# Patient Record
Sex: Male | Born: 1950 | Race: White | Hispanic: No | Smoking: Former smoker
Health system: Southern US, Community
[De-identification: ages and names within clinical notes are randomized; demographics above are authoritative.]

## PROBLEM LIST (undated history)

## (undated) DIAGNOSIS — N185 Chronic kidney disease, stage 5: Secondary | ICD-10-CM

## (undated) DIAGNOSIS — N189 Chronic kidney disease, unspecified: Secondary | ICD-10-CM

## (undated) DIAGNOSIS — R011 Cardiac murmur, unspecified: Secondary | ICD-10-CM

## (undated) DIAGNOSIS — I10 Essential (primary) hypertension: Secondary | ICD-10-CM

## (undated) DIAGNOSIS — E611 Iron deficiency: Secondary | ICD-10-CM

## (undated) DIAGNOSIS — N200 Calculus of kidney: Secondary | ICD-10-CM

## (undated) DIAGNOSIS — Z87442 Personal history of urinary calculi: Secondary | ICD-10-CM

## (undated) DIAGNOSIS — N529 Male erectile dysfunction, unspecified: Secondary | ICD-10-CM

## (undated) DIAGNOSIS — D3002 Benign neoplasm of left kidney: Secondary | ICD-10-CM

## (undated) DIAGNOSIS — G4733 Obstructive sleep apnea (adult) (pediatric): Secondary | ICD-10-CM

## (undated) DIAGNOSIS — E119 Type 2 diabetes mellitus without complications: Secondary | ICD-10-CM

## (undated) DIAGNOSIS — D631 Anemia in chronic kidney disease: Secondary | ICD-10-CM

## (undated) DIAGNOSIS — R55 Syncope and collapse: Secondary | ICD-10-CM

## (undated) DIAGNOSIS — I341 Nonrheumatic mitral (valve) prolapse: Secondary | ICD-10-CM

## (undated) DIAGNOSIS — E7800 Pure hypercholesterolemia, unspecified: Secondary | ICD-10-CM

## (undated) HISTORY — PX: TONSILLECTOMY: SUR1361

## (undated) HISTORY — DX: Chronic kidney disease, unspecified: N18.9

## (undated) HISTORY — DX: Nonrheumatic mitral (valve) prolapse: I34.1

## (undated) HISTORY — DX: Essential (primary) hypertension: I10

## (undated) HISTORY — PX: COLONOSCOPY: SHX174

## (undated) HISTORY — DX: Type 2 diabetes mellitus without complications: E11.9

## (undated) HISTORY — DX: Anemia in chronic kidney disease: D63.1

## (undated) HISTORY — DX: Calculus of kidney: N20.0

## (undated) HISTORY — DX: Male erectile dysfunction, unspecified: N52.9

## (undated) HISTORY — DX: Chronic kidney disease, stage 5: N18.5

## (undated) HISTORY — DX: Pure hypercholesterolemia, unspecified: E78.00

## (undated) HISTORY — DX: Obstructive sleep apnea (adult) (pediatric): G47.33

## (undated) HISTORY — DX: Iron deficiency: E61.1

## (undated) HISTORY — DX: Benign neoplasm of left kidney: D30.02

---

## 2008-03-28 ENCOUNTER — Inpatient Hospital Stay (HOSPITAL_COMMUNITY): Admission: RE | Admit: 2008-03-28 | Discharge: 2008-03-31 | Payer: Self-pay | Admitting: Orthopedic Surgery

## 2008-09-30 DIAGNOSIS — D3002 Benign neoplasm of left kidney: Secondary | ICD-10-CM

## 2008-09-30 DIAGNOSIS — N200 Calculus of kidney: Secondary | ICD-10-CM

## 2008-09-30 HISTORY — DX: Calculus of kidney: N20.0

## 2008-09-30 HISTORY — PX: JOINT REPLACEMENT: SHX530

## 2008-09-30 HISTORY — DX: Benign neoplasm of left kidney: D30.02

## 2008-09-30 HISTORY — PX: PARTIAL NEPHRECTOMY: SHX414

## 2009-03-31 ENCOUNTER — Encounter: Admission: RE | Admit: 2009-03-31 | Discharge: 2009-03-31 | Payer: Self-pay | Admitting: Nephrology

## 2009-04-05 ENCOUNTER — Ambulatory Visit (HOSPITAL_COMMUNITY): Admission: RE | Admit: 2009-04-05 | Discharge: 2009-04-05 | Payer: Self-pay | Admitting: Nephrology

## 2009-05-10 ENCOUNTER — Ambulatory Visit (HOSPITAL_COMMUNITY): Admission: RE | Admit: 2009-05-10 | Discharge: 2009-05-10 | Payer: Self-pay | Admitting: Urology

## 2009-05-25 ENCOUNTER — Encounter (INDEPENDENT_AMBULATORY_CARE_PROVIDER_SITE_OTHER): Payer: Self-pay | Admitting: Urology

## 2009-05-25 ENCOUNTER — Inpatient Hospital Stay (HOSPITAL_COMMUNITY): Admission: RE | Admit: 2009-05-25 | Discharge: 2009-05-28 | Payer: Self-pay | Admitting: Urology

## 2010-04-12 IMAGING — RF DG RETROGRADE PYELOGRAM
1 series · 7 of 7 positions shown · non-contrast
Comparison: CT abdomen 04/05/2009.

CLINICAL DATA: Left renal mass, bladder lesion.

RETROGRADE PYELOGRAM

[Series 1: run · 7 of 7 slices shown]
[im 1/7]
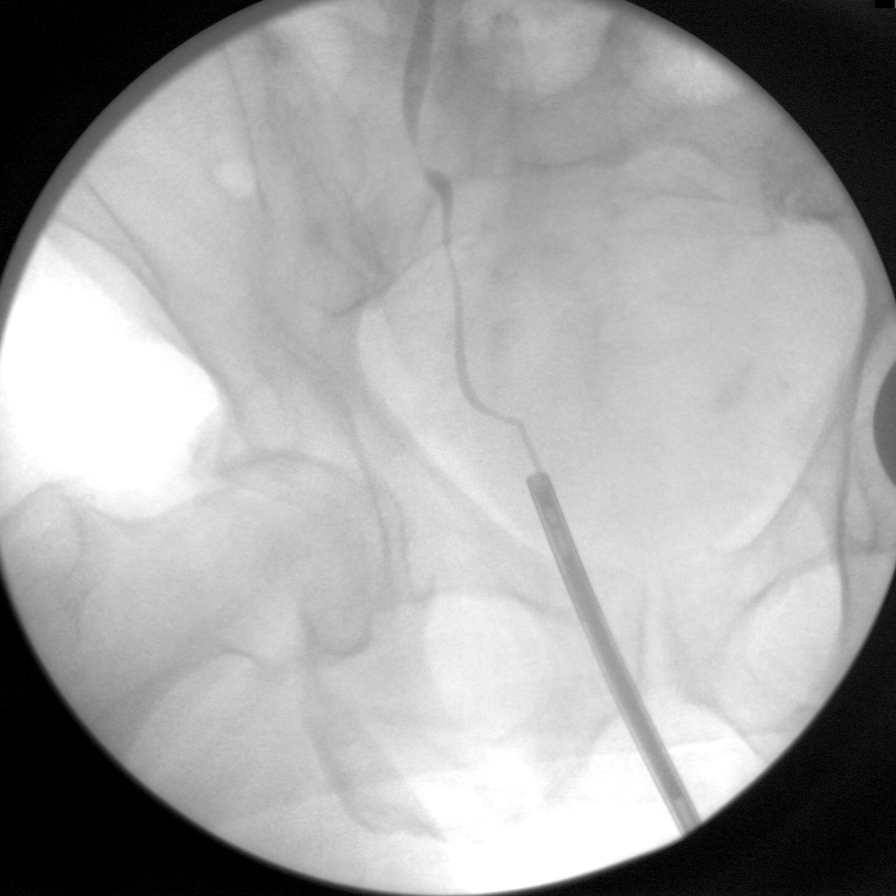
[im 2/7]
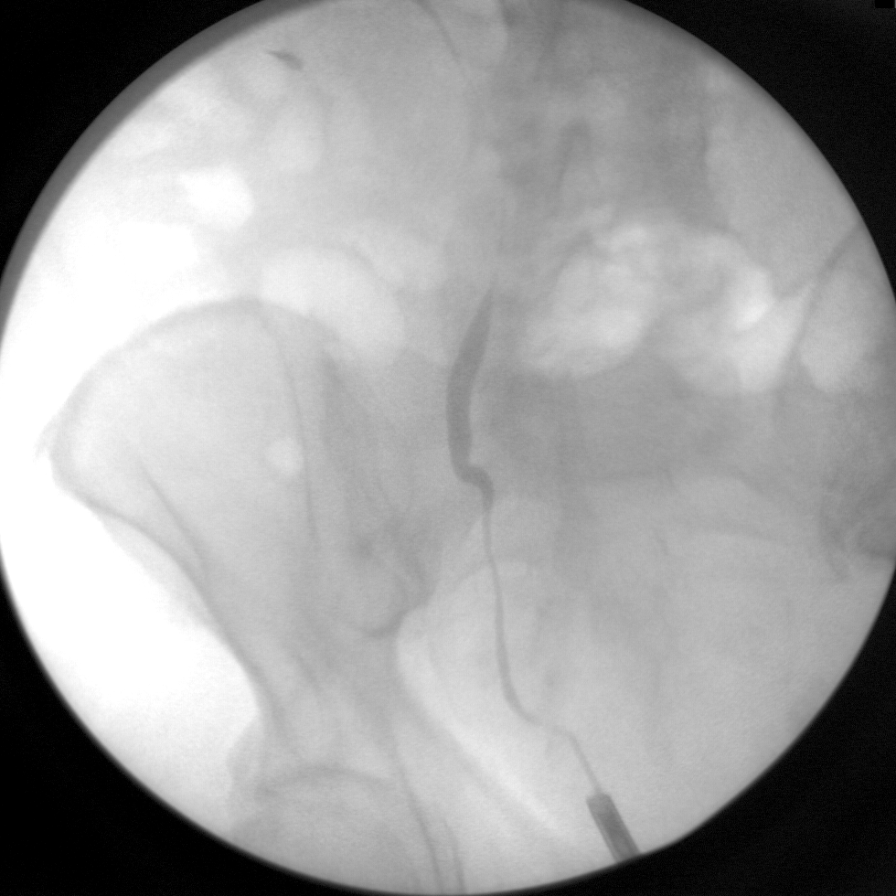
[im 3/7]
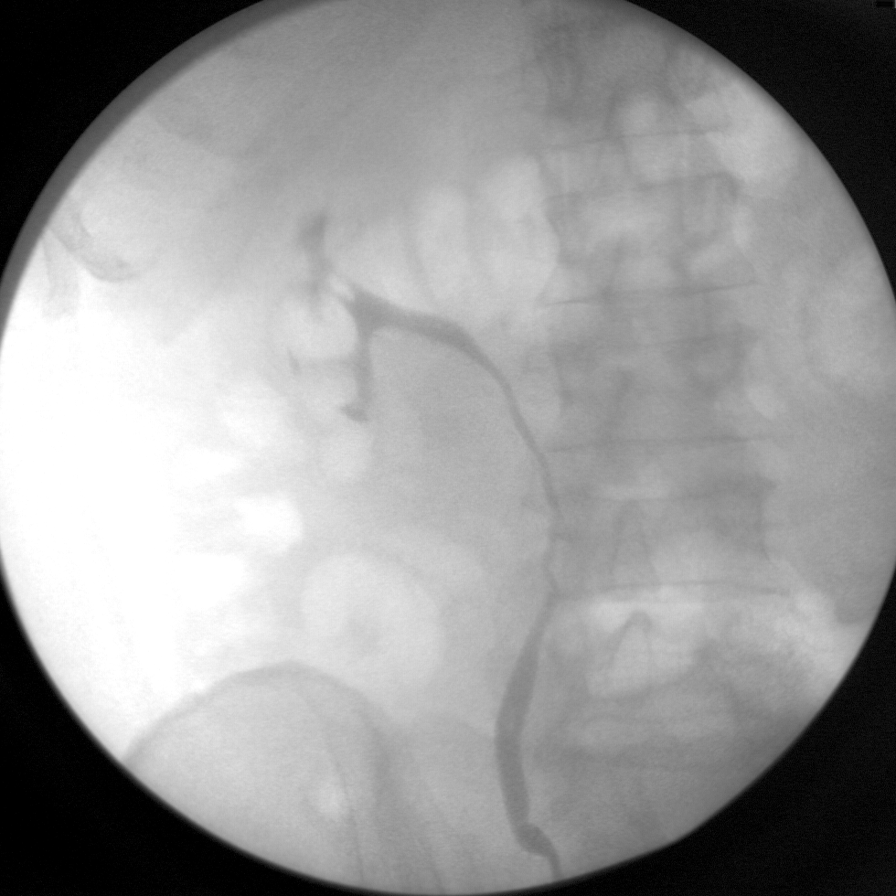
[im 4/7]
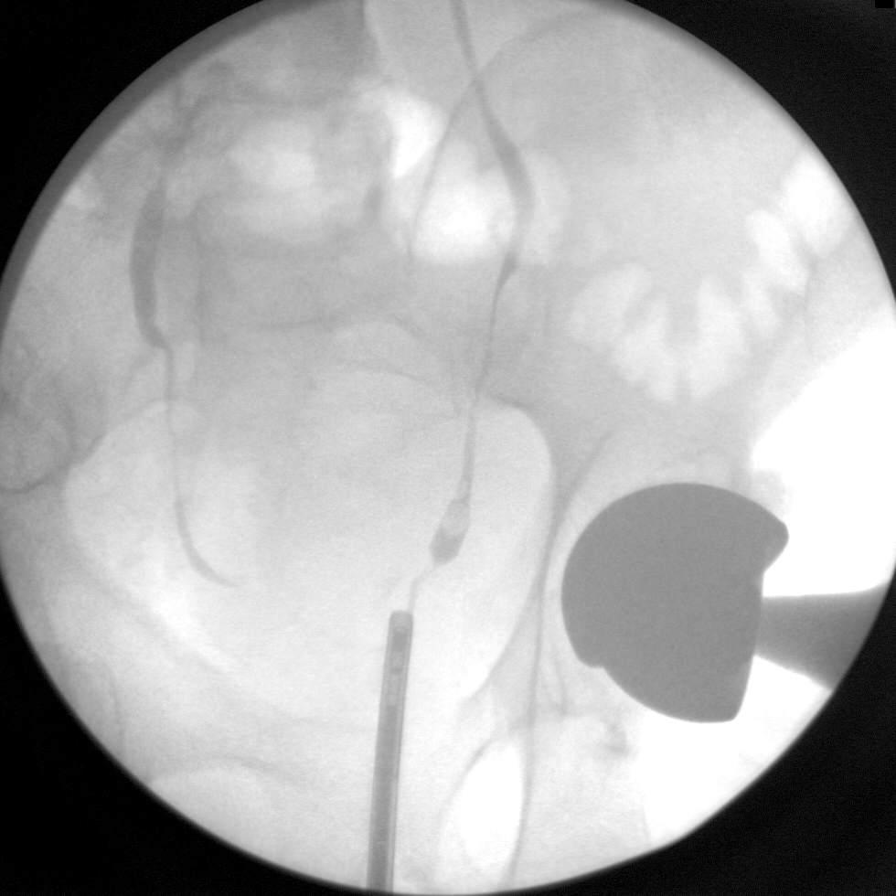
[im 5/7]
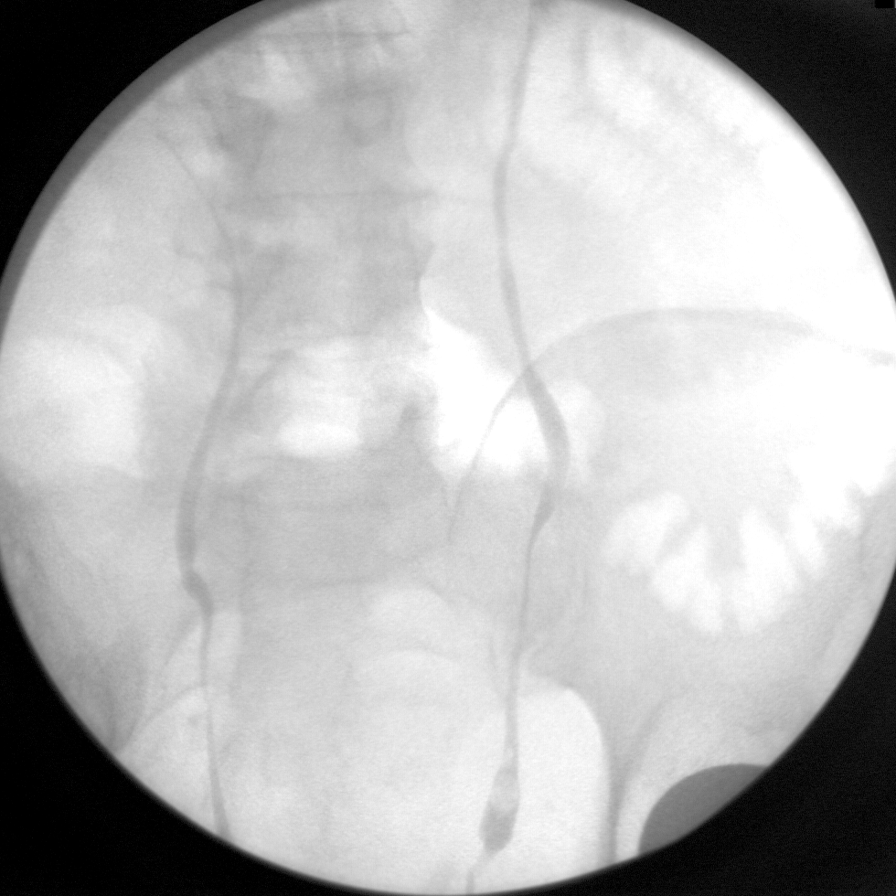
[im 6/7]
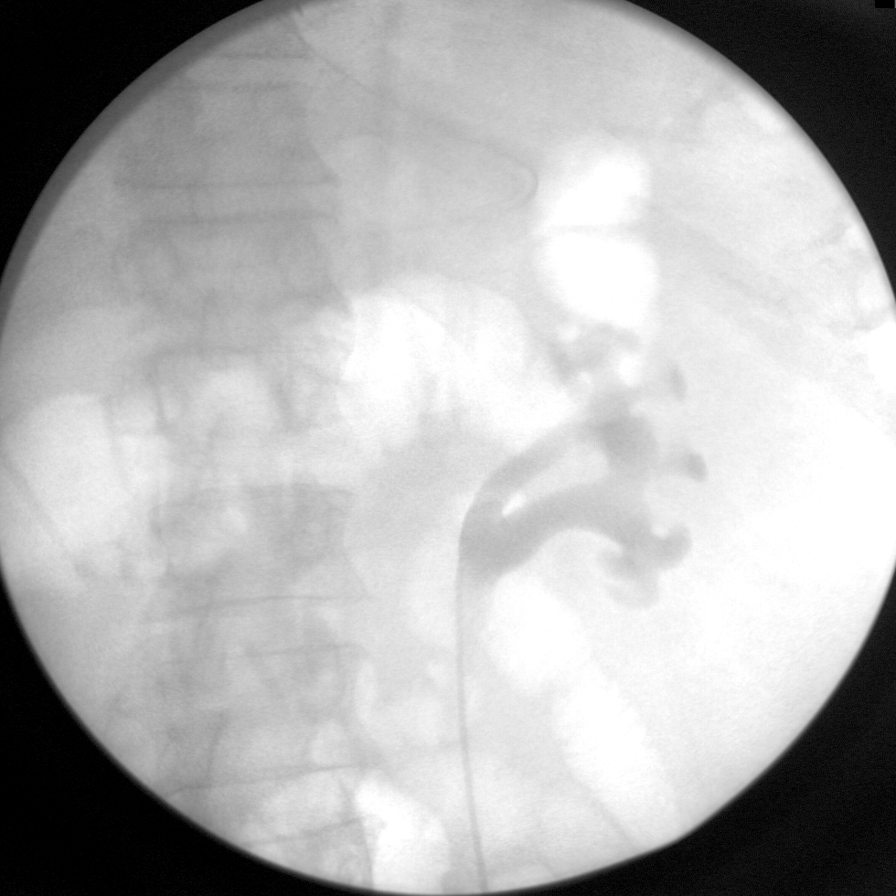
[im 7/7]
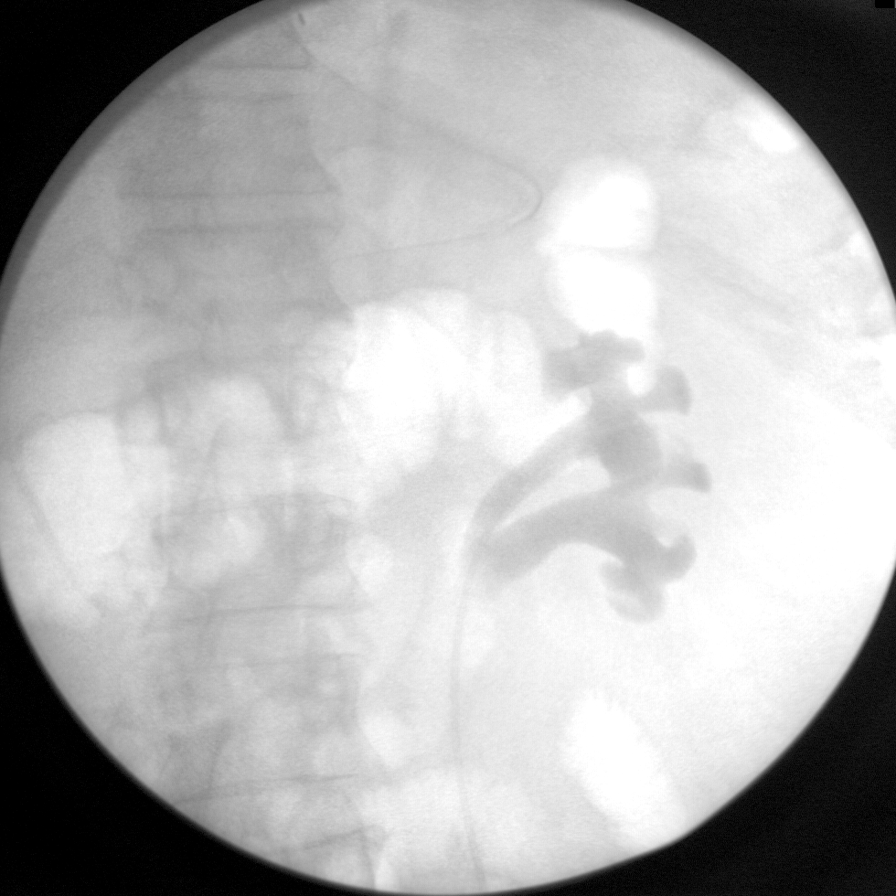

[7 of 7 positions shown; findings below may reference images not displayed]

FINDINGS: Bilateral retrograde pyelograms were performed.
Visualized right ureter is normal caliber.  No filling defects.
Collecting system normal.

Within the left ureter, there is a rounded filling defect which
could represent retained stone.  There is mild fullness of the left
renal collecting system.
IMPRESSION: Filling defect in the distal left ureter concerning for distal
ureteral stone.  Mild fullness of the left renal collecting system.

## 2010-10-22 ENCOUNTER — Encounter: Payer: Self-pay | Admitting: Nephrology

## 2011-01-05 LAB — CBC
HCT: 39.3 % (ref 39.0–52.0)
HCT: 42.9 % (ref 39.0–52.0)
HCT: 43.9 % (ref 39.0–52.0)
Hemoglobin: 13.2 g/dL (ref 13.0–17.0)
Hemoglobin: 15 g/dL (ref 13.0–17.0)
Hemoglobin: 15.6 g/dL (ref 13.0–17.0)
MCHC: 33.7 g/dL (ref 30.0–36.0)
MCHC: 34.9 g/dL (ref 30.0–36.0)
MCHC: 35.6 g/dL (ref 30.0–36.0)
MCV: 88.8 fL (ref 78.0–100.0)
MCV: 89.1 fL (ref 78.0–100.0)
MCV: 91 fL (ref 78.0–100.0)
Platelets: 183 10*3/uL (ref 150–400)
Platelets: 187 10*3/uL (ref 150–400)
Platelets: 195 10*3/uL (ref 150–400)
RBC: 4.31 MIL/uL (ref 4.22–5.81)
RBC: 4.82 MIL/uL (ref 4.22–5.81)
RBC: 4.95 MIL/uL (ref 4.22–5.81)
RDW: 13.1 % (ref 11.5–15.5)
RDW: 14 % (ref 11.5–15.5)
RDW: 14.2 % (ref 11.5–15.5)
WBC: 15.1 10*3/uL — ABNORMAL HIGH (ref 4.0–10.5)
WBC: 17 10*3/uL — ABNORMAL HIGH (ref 4.0–10.5)
WBC: 9.9 10*3/uL (ref 4.0–10.5)

## 2011-01-05 LAB — BASIC METABOLIC PANEL
BUN: 11 mg/dL (ref 6–23)
BUN: 14 mg/dL (ref 6–23)
BUN: 14 mg/dL (ref 6–23)
BUN: 15 mg/dL (ref 6–23)
BUN: 16 mg/dL (ref 6–23)
CO2: 28 mEq/L (ref 19–32)
CO2: 29 mEq/L (ref 19–32)
CO2: 29 mEq/L (ref 19–32)
CO2: 30 mEq/L (ref 19–32)
CO2: 30 mEq/L (ref 19–32)
Calcium: 7.8 mg/dL — ABNORMAL LOW (ref 8.4–10.5)
Calcium: 8.2 mg/dL — ABNORMAL LOW (ref 8.4–10.5)
Calcium: 8.3 mg/dL — ABNORMAL LOW (ref 8.4–10.5)
Calcium: 8.6 mg/dL (ref 8.4–10.5)
Calcium: 8.8 mg/dL (ref 8.4–10.5)
Chloride: 102 mEq/L (ref 96–112)
Chloride: 102 mEq/L (ref 96–112)
Chloride: 103 mEq/L (ref 96–112)
Chloride: 104 mEq/L (ref 96–112)
Chloride: 106 mEq/L (ref 96–112)
Creatinine, Ser: 1.41 mg/dL (ref 0.4–1.5)
Creatinine, Ser: 1.42 mg/dL (ref 0.4–1.5)
Creatinine, Ser: 1.53 mg/dL — ABNORMAL HIGH (ref 0.4–1.5)
Creatinine, Ser: 1.55 mg/dL — ABNORMAL HIGH (ref 0.4–1.5)
Creatinine, Ser: 1.73 mg/dL — ABNORMAL HIGH (ref 0.4–1.5)
GFR calc Af Amer: 49 mL/min — ABNORMAL LOW (ref >60)
GFR calc Af Amer: 56 mL/min — ABNORMAL LOW (ref >60)
GFR calc Af Amer: 56 mL/min — ABNORMAL LOW (ref >60)
GFR calc Af Amer: >60 mL/min (ref >60)
GFR calc Af Amer: >60 mL/min (ref >60)
GFR calc non Af Amer: 40 mL/min — ABNORMAL LOW (ref >60)
GFR calc non Af Amer: 46 mL/min — ABNORMAL LOW (ref >60)
GFR calc non Af Amer: 47 mL/min — ABNORMAL LOW (ref >60)
GFR calc non Af Amer: 51 mL/min — ABNORMAL LOW (ref >60)
GFR calc non Af Amer: 51 mL/min — ABNORMAL LOW (ref >60)
Glucose, Bld: 106 mg/dL — ABNORMAL HIGH (ref 70–99)
Glucose, Bld: 108 mg/dL — ABNORMAL HIGH (ref 70–99)
Glucose, Bld: 117 mg/dL — ABNORMAL HIGH (ref 70–99)
Glucose, Bld: 143 mg/dL — ABNORMAL HIGH (ref 70–99)
Glucose, Bld: 193 mg/dL — ABNORMAL HIGH (ref 70–99)
Potassium: 3 mEq/L — ABNORMAL LOW (ref 3.5–5.1)
Potassium: 3.3 mEq/L — ABNORMAL LOW (ref 3.5–5.1)
Potassium: 3.3 mEq/L — ABNORMAL LOW (ref 3.5–5.1)
Potassium: 3.7 mEq/L (ref 3.5–5.1)
Potassium: 3.9 mEq/L (ref 3.5–5.1)
Sodium: 139 mEq/L (ref 135–145)
Sodium: 139 mEq/L (ref 135–145)
Sodium: 139 mEq/L (ref 135–145)
Sodium: 139 mEq/L (ref 135–145)
Sodium: 141 mEq/L (ref 135–145)

## 2011-01-05 LAB — TYPE AND SCREEN
ABO/RH(D): O POS
Antibody Screen: NEGATIVE

## 2011-01-05 LAB — CREATININE, FLUID (PLEURAL, PERITONEAL, JP DRAINAGE): Creat, Fluid: 1.5 mg/dL

## 2011-01-05 LAB — ABO/RH: ABO/RH(D): O POS

## 2011-01-05 LAB — HEMOGLOBIN AND HEMATOCRIT, BLOOD
HCT: 35.9 % — ABNORMAL LOW (ref 39.0–52.0)
Hemoglobin: 12.4 g/dL — ABNORMAL LOW (ref 13.0–17.0)

## 2011-02-12 NOTE — Discharge Summary (Signed)
Peter [name], Peter [name]                 ACCOUNT NO.:  [id number]   MEDICAL RECORD NO.:  [id number]          PATIENT TYPE:  INP   LOCATION:  1419                         FACILITY:  [facility]   PHYSICIAN:  Raynelle Bring, MD      DATE OF BIRTH:  [birth year]   DATE OF ADMISSION:  05/25/2009  DATE OF DISCHARGE:  05/28/2009                               DISCHARGE SUMMARY   ADMISSION DIAGNOSES:  1. Left renal neoplasm  2. History of microscopic hematuria.  3. Bladder lesion.   DISCHARGE DIAGNOSES:  1. Renal oncocytoma.  2. Microscopic hematuria.  3. Left ureteral calculus.  4. Benign bladder lesion.   HISTORY AND PHYSICAL:  For full details, please see admission history  and physical.  Briefly, Peter [name] is a 59 year old gentleman who  initially presented with microscopic hematuria and an incidentally  detected left renal neoplasm that was concerning for renal malignancy.  He underwent metastatic evaluation which was negative and was taken to  the operating room on May 25, 2009, and underwent cystoscopy with  resection of a bladder lesion that was noted on office cystoscopy.  He  also underwent bilateral retrograde pyelography which did reveal a  distal left ureteral filling defect and was found to have a ureteral  calculus.  Therefore, holmium laser lithotripsy was performed and a left  ureteral stent was placed.  He then underwent a left laparoscopic  assisted partial nephrectomy.  Postoperatively, he was able to be  transferred to a regular hospital room and had no complications  associated with his procedure.  He was maintained on strict bedrest for  24 hours and remained hemodynamically stable.  On postoperative day #1,  his hemoglobin remained stable at 13.2.  His serum creatinine stabilized  over his hospital course and was 1.4 upon discharge.  Due to the  extensive resection required for removal of his renal neoplasm, he did  have a perinephric drain that was left in place and his  Foley catheter  was left in place for 2 days.  His catheter was then removed and his  drain output was monitored and did slightly increased over the next 24  hours.  This was checked for a creatinine level and was consistent with  serum at 1.5.  He otherwise was able to begin ambulating which he did  without difficulty and his diet was gradually advanced to a regular diet  as he did have return of bowel function.  By postoperative day #3, he  appeared to be stable for discharge.  Due to the fact that there was  concern for a possible delayed urine leak, it was decided still to leave  his perinephric drain in place.  He was instructed on how to care for  this drain and will plan to measure output.   DISPOSITION:  Home.   DISCHARGE MEDICATIONS:  He will resume his regular home medications.   In addition, he has been instructed to refrain from any heavy lifting or  strenuous activity.  He will resume a regular diet.   FOLLOW UP:  He will follow-up later this  week for probable drain  removal.   ADDENDUM:  During his hospital course, his pathology did return.  His  bladder lesion was noted to be benign.  In addition, his renal neoplasm  did return as a renal oncocytoma.  These findings and their implications  were discussed with the patient during his hospitalization.      Raynelle Bring, MD  Electronically Signed     LB/MEDQ  D:  05/28/2009  T:  05/28/2009  Job:  [id number]

## 2011-02-12 NOTE — Op Note (Signed)
[name], [name] NO.:  [id number]   MEDICAL RECORD NO.:  [id number]          PATIENT TYPE:  INP   LOCATION:  2899                         FACILITY:  [facility]   PHYSICIAN:  Kathalene Frames. Mayer Camel, M.D.   DATE OF BIRTH:  Dec 18, 1950   DATE OF PROCEDURE:  03/28/2008  DATE OF DISCHARGE:                               OPERATIVE REPORT   PREOPERATIVE DIAGNOSIS:  End-stage arthritis, left hip with large  osteophytes.   POSTOPERATIVE DIAGNOSIS:  End-stage arthritis, left hip with large  osteophytes.   PROCEDURE:  Left total hip arthroplasty using a 56-mm DePuy ASR cup and  K+0 49-mm ultimate ball 18 x 13 x 150 x 42 S-ROM stem, 74F XXL cone.   SURGEON:  Kathalene Frames. Mayer Camel, M.D.   FIRST ASSISTANT:  Leafy Kindle, PA.   ANESTHETIC:  General endotracheal.   ESTIMATED BLOOD LOSS:  550   FLUID REPLACEMENT:  2 liters crystalloid.   DRAINS PLACED:  Foley catheter.   URINE OUTPUT:  300 mL.   INDICATIONS FOR PROCEDURE:  The patient is a 60 year old man with end-  stage arthritis of his left hip who is down to bone-on-bone with super  acetabular cysts, huge inferior, anterior, and posterior osteophytes and  stuck in external rotation contracture of about 15 degrees.  Flexion is  also limited and he has about a 25 degrees flexion contracture.  Because  of pain and limited motion, he desires elective total hip  arthroplasties.  He has obviously lived with this pain for many years,  and now he has got to the point where he cannot function.  Risks and  benefits of surgery discussed, questions answered.   DESCRIPTION OF PROCEDURE:  The patient was identified by armband and in  the preop area received 2 g of Ancef.  He was then taken to operating  room #1 where the appropriate anesthetic monitors were attached and  general endotracheal anesthesia induced with the patient in supine  position.  A Foley catheter was inserted.  He was rolled into the right  lateral decubitus position  and fixed there with a Stulberg Mark II  pelvic clamp.  The left lower extremity was then prepped and draped in  sterile fashion from the ankle to the hemipelvis and the skin along the  lateral hip and thigh infiltrated with 20 mL of 0.5% Marcaine and  epinephrine solution.  At this point, we began the actual procedure by  making a 22-cm skin incision centered over the greater trochanter  allowing a posterolateral approach to the hip joint.  We cut through the  skin and subcutaneous tissue down to the level of IT band, which was  incised along with the skin incision exposing the greater trochanter.  The patient was stuck in external rotation about 15 degrees and a Cobra  retractor was placed between the gluteus minimus and the superior hip  joint capsule and between the quadratus femoris and the inferior hip  joint capsule.  The short external rotators and piriformis were then  tagged with a #2 Ethibond suture and cut off their insertion  on the  intertrochanteric crest exposing the posterior aspect of the hip joint  capsule which was then developed into an acetabular based flap going  from posterior-superior over the femoral neck and then posterior  inferior.  This was also tagged to #2 Ethibond sutures.  We then  sectioned the direct head of the rectus femoris and also released the  obturator as it wrapped around the femoral neck.  This allowed [place] to  flex and internally rotate the hip dislocating the femoral head and a  standard neck cut was performed one small fingerbreadth above the lesser  trochanter.  The femoral head had huge osteophytes anteriorly and  posteriorly and was placed on the back table.  We then cleaned up the  neck cut one more time and translated the proximal femur anteriorly.  This allowed [place] to remove huge anterior and posterior osteophytes that  were 2-2.5 cm in likely anteriorly and posteriorly as well as less so  superiorly.  Inferiorly, the osteophytes approached  to 3 cm.  After  these have been nibbled away, we then reamed up to a of 55 mm basket  reamer in preparation for the acetabular component, which was a 56-mm  DePuy ASR cup, hammered in 45 degrees of abduction and 20 degrees of  anteversion and then more osteophytes were removed.  At this point, the  hip was flexed, internally rotated exposing the proximal femur which was  entered with a box cutting chisel followed by the initiating reamer  followed by axial reaming up to a 13.5 axial reamer after first getting  shattered at 12.5.  We then conically reamed up to an 39 F conical  reamer to the appropriate depth for 42 base neck and then calcar milled  up to an 57F XXL calcar.  A trial 57F XXL cone was hammered into place  followed by the trial stem and then K+0 49 mm ultimate ball trial.  This  was in the same version as the calcar, which was about 20 degrees.  The  hip was then reduced, taken through range of motion and extension,  external rotation could not be dislocated, and flexion and internal  rotation could be dislocated at around 45 degrees, but there was quite a  lot of anterior redundant scar tissue from his previous contractures.  It was felt this would subside over time and there was no bony  impingement.  At this point, the trial components were removed and 57F  XXL ZTT1 cone was hammered into place followed by 18 x 13 x 150 x 42  stem and anteversion of the femur for a combined anteversion of about 45  degrees.  After the stem was seated then K+0 49-mm ultimate ball was  hammered onto the stem.  The hip again reduced and stability noted to be  excellent.  At this point, the wound was irrigated out with normal  saline solution.  Small osteophytes that were remaining were also  removed.  The capsular flap and short external rotators were repaired  back to the intertrochanteric crest through drill holes.  The IT band  was then closed with running #1 Vicryl suture.  The  subcutaneous tissue  with 0 and 2-0 undyed Vicryl suture and the skin with skin staples.  A  dressing of Mepilex was then applied.  The patient was unclamped, rolled  supine, awakened, and taken to the recovery room without difficulty.      Kathalene Frames. Mayer Camel, M.D.  Electronically Signed  FJR/MEDQ  D:  03/28/2008  T:  03/29/2008  Job:  [id number]

## 2011-02-12 NOTE — Op Note (Signed)
Peter [name], Peter [name]                 ACCOUNT NO.:  [id number]   MEDICAL RECORD NO.:  [id number]          PATIENT TYPE:  INP   LOCATION:  0003                         FACILITY:  [facility]   PHYSICIAN:  Raynelle Bring, MD      DATE OF BIRTH:  [birth year]   DATE OF PROCEDURE:  05/25/2009  DATE OF DISCHARGE:                               OPERATIVE REPORT   PREOPERATIVE DIAGNOSIS:  1. Microscopic hematuria.  2. Left renal mass.  3. Left renal calculi.  4. Bladder lesion.   POSTOPERATIVE DIAGNOSES:  1. Microscopic hematuria.  2. Left renal mass.  3. Left renal calculi.  4. Left ureteral calculus.  5. Bladder lesion.   PROCEDURES:  1. Cystoscopy.  2. Bilateral retrograde pyelography.  3. Left ureteroscopy with laser lithotripsy and stone removal.  4. Transurethral resection of bladder neck lesion.  5. Left ureteral stent placement (6 x 24).  6. Left robotic assisted laparoscopic partial nephrectomy.   SURGEON:  Raynelle Bring, MD   ASSISTANT:  Franco Collet, nurse practitioner.   ANESTHESIA:  General.   COMPLICATIONS:  None.   ESTIMATED BLOOD LOSS:  100 mL.   SPECIMENS:  1. Left ureteral calculi which were sent for stone analysis.  2. Left renal margin.  3. Left renal mass.   INTRAOPERATIVE FINDINGS:  Warm ischemia time was 42 minutes and the left  renal margins were negative for tumor.   INDICATIONS FOR PROCEDURE:  Peter [name]  is a 60 year old gentleman who  is incidentally found to have a left renal mass.  He was also noted to  have microscopic hematuria and underwent office cystoscopy which  revealed lesions at the bladder neck but no other obvious findings.  It  was decided to proceed with the above procedures for further evaluation  of his microscopic hematuria and to proceed with surgical excision of  his left renal mass, considering his metastatic evaluation was negative.  The potential risks, complications, and alternative treatment options  associated with the above  procedures were discussed in detail.  Informed  consent was obtained.   DESCRIPTION OF PROCEDURE:  The patient was taken to the operating room  and a general anesthetic was administered.  He was given preoperative  antibiotics including Ancef and ciprofloxacin considering his history of  a total hip replacement.  He was placed in the dorsal lithotomy position  and prepped and draped in the usual sterile fashion.  Next a  preoperative time-out was performed.  Cystourethroscopy was then  performed which revealed a normal anterior urethra.  The posterior  urethra was notable for very high bladder neck and a mildly enlarged  median lobe.  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  No other obvious bladder tumors, stones, or other  mucosal pathology was identified.  Attention then turned to the right  ureteral orifice and this was intubated with a 6-French ureteral  catheter and Omnipaque contrast was injected.  This revealed no evidence  of any filling defects within the ureter or right  renal collecting  system.  The ureteral catheter was then used to intubate the left  ureteral calculus and injected with Omnipaque contrast.  There was noted  to be a large 8 mm filling defect in the left lower ureter.  No other  abnormalities were noted upon filling the proximal ureter and renal  collecting system.  It was decided to proceed with the semi-rigid  ureteroscopy at this point.  A 0.038 sensor guidewire was advanced up  into the renal pelvis and  semirigid ureteroscopy was then performed.  There was noted to be a large ureteral calculus.  Therefore the 365  micron holmium laser fiber was used to fragment the stone with all  fragments removed utilizing a nitinol basket.  The stones were then  removed for analysis.  The lesions at the bladder neck were then  resected using loop cautery resection.   Hemostasis was achieved and a 22-  [place] three-way catheter was placed after hemostasis was achieved and  all specimens were removed from the bladder.  The patient was then  repositioned in the left modified flank position and prepped and draped  in the usual sterile fashion.  A site was selected just to the left of  the umbilicus for placement of the camera port and this was placed using  a standard open Hassan technique allowing entry into the peritoneal  cavity under direct vision without difficulty.  The remaining ports were  then placed with 8 mm robotic ports placed in the left upper quadrant,  left lower quadrant, and far left lower quadrant.  A 12 mm port was  placed between the camera port and the left upper quadrant port.  All  ports were placed under direct vision without difficulty.  The surgical  cart was then docked.  With the aid of the cautery scissors, the white  line of Toldt was incised along the length of the descending colon  allowing the colon be mobilized medially and the space between Gerota's  fascia and the mesocolon to be developed.  The ureter and gonadal vein  were identified and then lifted anteriorly off the psoas muscle.  There  was noted to be a large amount of retroperitoneal and perinephric fat  which did make this dissection somewhat difficult.  The gonadal vein was  able to be traced up to the level of the renal hilum where the renal  vein was identified.  It was isolated with a combination of blunt and  sharp dissection.  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  This was consistent with the patient's findings on his  preoperative imaging.  Attention then turned to mobilization of the  kidney;  12.5 grams of intravenous mannitol was administered.  The  perinephric fat was noted to be excessive and was very stuck to the  renal capsule creating a difficult  dissection.  However, the kidney was  able to be completely mobilized inferiorly, laterally, posteriorly, and  superiorly allowing it to be rotated medially.  The patient's renal mass  was easily identified laterally and very exophytic and measured  approximately 4.5 cm.  Once the mass was adequately isolated the renal  artery was clamped with a laparoscopic bulldog clamp with the renal vein  subsequently clamped with a second bulldog clamp.  A sharp cold scissor  dissection was then used to resect the renal tumor.  Multiple  areas of  entry into the collecting system were identified as expected,  considering the masses location and the fact that it did abut the  collecting system of the kidney.  Additional deep margins were taken and  sent for frozen section analysis and these were negative for malignancy.  Attention then turned to reconstruction of the kidney at this point.  The areas where entry into the collecting system was identified were  closed with running or figure-of-eight 4-0 Vicryl sutures and secured  with lapper ties.  The bladder was filled with a combination of indigo  carmine and saline and allowed to reflux up the stent.  However, no blue  drainage was seen from the kidney following resection.  In addition,  intravenous indigo carmine was administered without any blue seen during  the remainder of the case.  A 2-0 Vicryl suture was then used to perform  a running closure of any obvious bleeding vessels and Surgicel bolsters  were then placed into the defect with FloSeal placed under the bolsters.  The bolsters were then secured with a double-armed 2-0 Monocryl suture  which was secured and used to provide renal compression with the aid of  Hem-o-lok clips and lapper retires on the outer capsular edge.  At this  point the renal vein and subsequently the renal artery were unclamped.  There was noted be some slight bleeding from the lower portion of the  resection site.   This was controlled by adding more FloSeal under the  bolsters as well as placing another piece of Surgicel to provide  compression just  underneath the bolsters.  This resulted in excellent  hemostasis.  An additional piece of Surgicel was placed over the entire  defect and the kidney was placed back into its normal anatomic position.  Even with the pneumoperitoneum completely let down, there was no  bleeding at this point.  The perinephric fat was then reapproximated  over the kidney with Hem-o-lok clips used to secure the perinephric fat;  12.5 grams of intravenous mannitol was again administered and a #15  round Blake drain was brought through the left lower quadrant lateral  port and positioned in the perinephric space.  It was secured to skin  with a nylon suture.  All remaining ports were removed under direct  vision.  The original 12 mm camera port site was closed with a figure-of-  eight zero Vicryl suture placed laparoscopically prior to removal of all  ports.  The specimen was removed within an Endopouch retrieval bag via  the 12-mm assistance port in the upper midline which was slightly  extended to allow this mass to be removed.  This opening was then closed  at the fascial level with a running zero Vicryl suture.  All port sites  were injected with 0.25% Marcaine and reapproximated at the skin level  with 4-0 Monocryl sutures.  Dermabond was then applied for skin closure.  The patient appeared to tolerate the procedure well without  complications.  He was able to be extubated and transferred to the  recovery unit in satisfactory condition.      Raynelle Bring, MD  Electronically Signed     LB/MEDQ  D:  05/25/2009  T:  05/25/2009  Job:  [id number]

## 2011-02-15 NOTE — Discharge Summary (Signed)
NAMEMASSAI, MANDUJANO                 ACCOUNT NO.:  [id number]   MEDICAL RECORD NO.:  [id number]          PATIENT TYPE:  INP   LOCATION:  5005                         FACILITY:  [facility]   PHYSICIAN:  Kathalene Frames. Mayer Camel, M.D.   DATE OF BIRTH:  07/23/1951   DATE OF ADMISSION:  03/28/2008  DATE OF DISCHARGE:  03/31/2008                               DISCHARGE SUMMARY   CHIEF COMPLAINT:  Left hip pain.   HISTORY OF PRESENT ILLNESS:  Peter [name] is a 60 year old gentleman who  complains of unremitting left hip pain in spite of conservative  treatment with nonsteroidal antiinflammatory drugs and narcotic pain  medicines.  At this point, the pain and dysfunction in his hip is  limiting to his daily activities and he desires a surgical intervention  at this time.  All risks and benefits of surgery were discussed with the  patient.   PAST MEDICAL HISTORY:  Significant for:  1. Hypertension.  2. Kidney stones.  3. High cholesterol.   PAST SURGICAL HISTORY:  Tonsillectomy and right knee surgery that was  done 30 years ago.   SOCIAL HISTORY:  He is a nonsmoker and does not drink alcohol.   FAMILY HISTORY:  Noncontributory.   ALLERGIES:  He has an allergy to PENICILLIN.   CURRENT MEDICATIONS:  1. Amlodipine 5 mg daily.  2. Atenolol 100 mg daily.  3. Potassium chloride 40 mg daily.  4. Lipitor 2.5 mg daily.   PHYSICAL EXAMINATION:  Gross examination of the left hip demonstrates  the patient to have a 20-degree flexion contracture.  He has  approximately 0 degrees in internal rotation and 20 degrees in external  rotation.  He is neurovascularly intact.   X-rays demonstrate bone-on-bone degenerative changes in the left hip and  flattening of the femoral head, acetabular cyst, and peripheral  osteocytes.   PREOP LABS:  White blood cells 9.6, red blood cells 4.8, platelets 201,  hemoglobin 15, and hematocrit 43.1.  PT 12.7 and INR is 0.9.  Sodium  139, potassium 3.9, chloride 108, glucose 90,  BUN 11, and creatinine  1.42.   HOSPITAL COURSE:  Mr. Street was admitted to [facility] on March 28, 2008, when he underwent a left total hip arthroplasty using a DePuy ASR  cup with an S-ROM stem.  A perioperative Foley catheter was placed.  Mr.  Hassani tolerated the procedure well and was transferred to the  Orthopedic Unit.  On the first postoperative day, Mr. Elko was doing  well.  He denied any nausea or vomiting.  His hemoglobin was 12.5.  His  dressing was found to be soaked with dry blood, so it was changed on  this day.  The patient walked approximately 30 feet with Physical  Therapy.  On the second postoperative day, Mr. Rozell Foley catheter  had been removed.  He was tolerating tolerate p.o. intake well and  passing flatus.  He continued to deny nausea or vomiting.  He reported  his hip pain to be 3/10.  His hemoglobin was 10.9.  He walked 150 feet  with Physical Therapy  using his walker.  On the third postoperative day,  Mr. Boase continued to eat well.  He was passing stool and flatus.  His  hemoglobin was 10.5.  He was ambulating independently with the use of  his walker.  He was discharged to his home on this day which was March 31, 2008.   DISPOSITION:  The patient was discharged home on March 31, 2008.  Home  health care managed his wound, his Coumadin, and his physical therapy.   DISCHARGE MEDICATIONS:  As per the HMR with the addition of Percocet 5  mg tablet 1-2 p.o. every 4 hours p.r.n. pain and Coumadin 5 mg tablet  take as directed.   He was weightbearing as tolerated with a walker and return to the clinic  to see Dr. Mayer Camel in 1 week.   FINAL DIAGNOSIS:  Left hip end-stage degenerative joint disease.      Leafy Kindle, PA      Kathalene Frames. Mayer Camel, M.D.  Electronically Signed    JW/MEDQ  D:  04/27/2008  T:  04/28/2008  Job:  [id number]

## 2011-06-27 LAB — CBC
HCT: 36.5 — ABNORMAL LOW
HCT: 43.1
HCT: 30.9 — ABNORMAL LOW
HCT: 31.9 — ABNORMAL LOW
Hemoglobin: 12.5 — ABNORMAL LOW
Hemoglobin: 15
Hemoglobin: 10.5 — ABNORMAL LOW
Hemoglobin: 10.9 — ABNORMAL LOW
MCHC: 34.3
MCHC: 34.8
MCHC: 33.9
MCHC: 34.2
MCV: 89.2
MCV: 90.2
MCV: 88.7
MCV: 89
Platelets: 197
Platelets: 201
Platelets: 169
Platelets: 179
RBC: 4.05 — ABNORMAL LOW
RBC: 4.83
RBC: 3.49 — ABNORMAL LOW
RBC: 3.59 — ABNORMAL LOW
RDW: 13.9
RDW: 14
RDW: 13.4
RDW: 13.5
WBC: 14.9 — ABNORMAL HIGH
WBC: 9.6
WBC: 10.9 — ABNORMAL HIGH
WBC: 12.1 — ABNORMAL HIGH

## 2011-06-27 LAB — PROTIME-INR
INR: 0.9
INR: 1
INR: 1.5
INR: 1.9 — ABNORMAL HIGH
Prothrombin Time: 12.7
Prothrombin Time: 13.6
Prothrombin Time: 18.7 — ABNORMAL HIGH
Prothrombin Time: 22 — ABNORMAL HIGH

## 2011-06-27 LAB — BASIC METABOLIC PANEL
BUN: 11
BUN: 13
CO2: 23
CO2: 26
Calcium: 8.8
Calcium: 8.9
Chloride: 105
Chloride: 108
Creatinine, Ser: 1.42
Creatinine, Ser: 1.44
GFR calc Af Amer: >60
GFR calc Af Amer: >60
GFR calc non Af Amer: 50 — ABNORMAL LOW
GFR calc non Af Amer: 51 — ABNORMAL LOW
Glucose, Bld: 148 — ABNORMAL HIGH
Glucose, Bld: 90
Potassium: 3.9
Potassium: 4.4
Sodium: 139
Sodium: 141

## 2011-06-27 LAB — URINALYSIS, ROUTINE W REFLEX MICROSCOPIC
Bilirubin Urine: NEGATIVE
Glucose, UA: NEGATIVE
Ketones, ur: NEGATIVE
Nitrite: NEGATIVE
Protein, ur: 30 — AB
Specific Gravity, Urine: 1.019
Urobilinogen, UA: 0.2
pH: 6.5

## 2011-06-27 LAB — DIFFERENTIAL
Basophils Absolute: 0.1
Basophils Relative: 1
Eosinophils Absolute: 0.5
Eosinophils Relative: 5
Lymphocytes Relative: 22
Lymphs Abs: 2.1
Monocytes Absolute: 0.5
Monocytes Relative: 5
Neutro Abs: 6.5
Neutrophils Relative %: 67

## 2011-06-27 LAB — URINE MICROSCOPIC-ADD ON

## 2011-06-27 LAB — APTT: aPTT: 26

## 2012-03-30 DIAGNOSIS — E119 Type 2 diabetes mellitus without complications: Secondary | ICD-10-CM

## 2012-03-30 HISTORY — DX: Type 2 diabetes mellitus without complications: E11.9

## 2012-04-06 ENCOUNTER — Other Ambulatory Visit: Payer: Self-pay | Admitting: Family Medicine

## 2013-04-15 ENCOUNTER — Encounter: Payer: Self-pay | Admitting: Cardiology

## 2013-09-27 ENCOUNTER — Encounter: Payer: Self-pay | Admitting: General Surgery

## 2013-09-27 DIAGNOSIS — G4733 Obstructive sleep apnea (adult) (pediatric): Secondary | ICD-10-CM | POA: Insufficient documentation

## 2013-09-27 DIAGNOSIS — I059 Rheumatic mitral valve disease, unspecified: Secondary | ICD-10-CM

## 2013-09-27 DIAGNOSIS — I10 Essential (primary) hypertension: Secondary | ICD-10-CM | POA: Insufficient documentation

## 2013-10-13 ENCOUNTER — Encounter: Payer: Self-pay | Admitting: General Surgery

## 2013-10-13 ENCOUNTER — Ambulatory Visit (INDEPENDENT_AMBULATORY_CARE_PROVIDER_SITE_OTHER): Payer: 59 | Admitting: Cardiology

## 2013-10-13 ENCOUNTER — Encounter: Payer: Self-pay | Admitting: Cardiology

## 2013-10-13 VITALS — BP 122/73 | HR 60 | Ht 66.0 in | Wt 262.4 lb

## 2013-10-13 DIAGNOSIS — I059 Rheumatic mitral valve disease, unspecified: Secondary | ICD-10-CM

## 2013-10-13 DIAGNOSIS — G4733 Obstructive sleep apnea (adult) (pediatric): Secondary | ICD-10-CM

## 2013-10-13 DIAGNOSIS — I519 Heart disease, unspecified: Secondary | ICD-10-CM

## 2013-10-13 DIAGNOSIS — I10 Essential (primary) hypertension: Secondary | ICD-10-CM

## 2013-10-13 DIAGNOSIS — I341 Nonrheumatic mitral (valve) prolapse: Secondary | ICD-10-CM | POA: Insufficient documentation

## 2013-10-13 DIAGNOSIS — I5189 Other ill-defined heart diseases: Secondary | ICD-10-CM | POA: Insufficient documentation

## 2013-10-13 LAB — BASIC METABOLIC PANEL
BUN: 24 mg/dL — ABNORMAL HIGH (ref 6–23)
CO2: 28 mEq/L (ref 19–32)
Calcium: 8.8 mg/dL (ref 8.4–10.5)
Chloride: 101 mEq/L (ref 96–112)
Creatinine, Ser: 2.5 mg/dL — ABNORMAL HIGH (ref 0.4–1.5)
GFR: 27.57 mL/min — ABNORMAL LOW (ref >60.00)
Glucose, Bld: 230 mg/dL — ABNORMAL HIGH (ref 70–99)
Potassium: 3.3 mEq/L — ABNORMAL LOW (ref 3.5–5.1)
Sodium: 137 mEq/L (ref 135–145)

## 2013-10-13 NOTE — Patient Instructions (Signed)
Your physician recommends that you continue on your current medications as directed. Please refer to the Current Medication list given to you today.  Your physician recommends that you go to the lab today for a BMET Panel  Your physician has requested that you have an echocardiogram. Echocardiography is a painless test that uses sound waves to create images of your heart. It provides your doctor with information about the size and shape of your heart and how well your heart's chambers and valves are working. This procedure takes approximately one hour. There are no restrictions for this procedure.  [facility] aware that you need a download.  Your physician wants you to follow-up in: 6 Months with Dr Mallie Snooks will receive a reminder letter in the mail two months in advance. If you don't receive a letter, please call our office to schedule the follow-up appointment.

## 2013-10-13 NOTE — Progress Notes (Signed)
[place], [place], [place]  [address] Phone: [phone] Fax:  [phone]  Date:  10/13/2013   ID:  STEVENSON MCKASKLE, DOB [birth year], MRN [id number]  PCP:  Simona Huh, MD  Cardiologist:  Fransico Him, MD     History of Present Illness: Peter [name] is a 63 y.o. male with a history of OSA on CPAP, HTN, MVP with mild to moderate MR, diastolic dysfunction and morbid obesity.  He is doing well with her CPAP therapy.  He tolerates her device well.  He tolerates his mask.  He feels the pressure is adequate.  He feels rested when he gets up if he goes to bed on time and has no daytime sleepiness.  He denies any chest pain, SOB, DOE, LE edema, dizziness, palpitations or syncope.   Wt Readings from Last 3 Encounters:  09/27/13 277 lb 12.8 oz (126.009 kg)     Past Medical History  Diagnosis Date  . MVP (mitral valve prolapse)     Posterior with MR by ECHO  . Hypercholesteremia   . 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 ED (erectile dysfunction)   . OSA (obstructive sleep apnea)     AHI 38/hr on CPAP at 12cm h2o  . Renal oncocytoma of left kidney 2010    w partial nephrectomy  . Kidney stone 2010  . CKD (chronic kidney disease), stage III   . Hypertension     Current Outpatient Prescriptions  Medication Sig Dispense Refill  . amLODipine (NORVASC) 10 MG tablet Take 10 mg by mouth daily.      Marland Kitchen aspirin 81 MG tablet Take 81 mg by mouth daily.      Marland Kitchen atenolol (TENORMIN) 50 MG tablet Take 50 mg by mouth daily.      Marland Kitchen atorvastatin (LIPITOR) 10 MG tablet Take 10 mg by mouth daily.      Marland Kitchen lisinopril (PRINIVIL,ZESTRIL) 20 MG tablet Take 20 mg by mouth daily.      . potassium chloride SA (K-DUR,KLOR-CON) 20 MEQ tablet Take 20 mEq by mouth 2 (two) times daily.      . sildenafil (VIAGRA) 100 MG tablet Take 100 mg by mouth daily as needed for erectile dysfunction.      . triamterene-hydrochlorothiazide (MAXZIDE) 75-50 MG per tablet Take 1 tablet by  mouth daily.       No current facility-administered medications for this visit.    Allergies:    Allergies  Allergen Reactions  . Penicillins     Childhood allergy     Social History:  The patient  reports that he has never smoked. He does not have any smokeless tobacco history on file. He reports that he does not drink alcohol or use illicit drugs.   Family History:  The patient's family history is not on file.   ROS:  Please see the history of present illness.      All other systems reviewed and negative.   PHYSICAL EXAM: VS:  There were no vitals taken for this visit. Well nourished, well developed, in no acute distress HEENT: normal Neck: no JVD Cardiac:  normal S1, S2; RRR; no murmur Lungs:  clear to auscultation bilaterally, no wheezing, rhonchi or rales Abd: soft, nontender, no hepatomegaly Ext: no edema Skin: warm and dry Neuro:  CNs 2-12 intact, no focal abnormalities noted   ASSESSMENT AND PLAN:  1. OSA on CPAP   - I will get a download from his DME  2. HTN - well controlled  - continue amlodipine/atenolol/lisinopril/maxide  - check BMET 3. Morbid obesity - encouraged him to get into a routine exercise program and watch a low fat diet 4. MVP of the posterior MV leaflet with mild to moderate MR by echo 2013  - recheck echo to reassess MR  Followup with me in 6 months  Signed, Fransico Him, MD 10/13/2013 9:55 AM

## 2013-10-14 ENCOUNTER — Telehealth: Payer: Self-pay | Admitting: General Surgery

## 2013-10-14 DIAGNOSIS — Z79899 Other long term (current) drug therapy: Secondary | ICD-10-CM

## 2013-10-14 NOTE — Telephone Encounter (Signed)
Message copied by Lily Kocher on Thu Oct 14, 2013  4:59 PM ------      Message from: Fransico Him R      Created: Thu Oct 14, 2013  4:56 PM       Please let patient know that he has acute on chronic renal failure probably from the diuretic.  Please have patient stop Maxide.  Please have him take his normal dose of Kdur tonight and I would like him to come in for a repeat BMET in the am. ------

## 2013-10-14 NOTE — Telephone Encounter (Signed)
Pt is aware. Med list updated and pt is coming in for BMET in the AM.

## 2013-10-15 ENCOUNTER — Other Ambulatory Visit (INDEPENDENT_AMBULATORY_CARE_PROVIDER_SITE_OTHER): Payer: 59

## 2013-10-15 ENCOUNTER — Telehealth: Payer: Self-pay | Admitting: Cardiology

## 2013-10-15 DIAGNOSIS — Z79899 Other long term (current) drug therapy: Secondary | ICD-10-CM

## 2013-10-15 LAB — BASIC METABOLIC PANEL
BUN: 25 mg/dL — ABNORMAL HIGH (ref 6–23)
CO2: 29 mEq/L (ref 19–32)
Calcium: 8.5 mg/dL (ref 8.4–10.5)
Chloride: 101 mEq/L (ref 96–112)
Creatinine, Ser: 2.2 mg/dL — ABNORMAL HIGH (ref 0.4–1.5)
GFR: 31.44 mL/min — ABNORMAL LOW (ref >60.00)
Glucose, Bld: 147 mg/dL — ABNORMAL HIGH (ref 70–99)
Potassium: 3.3 mEq/L — ABNORMAL LOW (ref 3.5–5.1)
Sodium: 138 mEq/L (ref 135–145)

## 2013-10-15 NOTE — Telephone Encounter (Signed)
Message copied by Alcario Drought on Fri Oct 15, 2013  4:30 PM ------      Message from: Fransico Him R      Created: Fri Oct 15, 2013  4:18 PM       Make sure that patient has stopped Maxide.  Please have patient take Kdur 40 meq tonight and then get a BMET check tomorrow at [facility] to determine if he should stay on potassium after stopping Maxide.  Please have results called to on call Cardiologist ------

## 2013-10-18 ENCOUNTER — Other Ambulatory Visit (INDEPENDENT_AMBULATORY_CARE_PROVIDER_SITE_OTHER): Payer: 59

## 2013-10-18 ENCOUNTER — Encounter: Payer: Self-pay | Admitting: Cardiovascular Disease

## 2013-10-18 ENCOUNTER — Ambulatory Visit (HOSPITAL_COMMUNITY): Payer: 59 | Attending: Cardiology | Admitting: Radiology

## 2013-10-18 DIAGNOSIS — Z6841 Body Mass Index (BMI) 40.0 and over, adult: Secondary | ICD-10-CM | POA: Insufficient documentation

## 2013-10-18 DIAGNOSIS — I341 Nonrheumatic mitral (valve) prolapse: Secondary | ICD-10-CM

## 2013-10-18 DIAGNOSIS — I059 Rheumatic mitral valve disease, unspecified: Secondary | ICD-10-CM | POA: Insufficient documentation

## 2013-10-18 DIAGNOSIS — G4733 Obstructive sleep apnea (adult) (pediatric): Secondary | ICD-10-CM | POA: Insufficient documentation

## 2013-10-18 DIAGNOSIS — I10 Essential (primary) hypertension: Secondary | ICD-10-CM | POA: Insufficient documentation

## 2013-10-18 DIAGNOSIS — Z79899 Other long term (current) drug therapy: Secondary | ICD-10-CM

## 2013-10-18 LAB — BASIC METABOLIC PANEL
BUN: 22 mg/dL (ref 6–23)
CO2: 30 mEq/L (ref 19–32)
Calcium: 8.7 mg/dL (ref 8.4–10.5)
Chloride: 101 mEq/L (ref 96–112)
Creatinine, Ser: 1.8 mg/dL — ABNORMAL HIGH (ref 0.4–1.5)
GFR: 39.7 mL/min — ABNORMAL LOW (ref >60.00)
Glucose, Bld: 247 mg/dL — ABNORMAL HIGH (ref 70–99)
Potassium: 3.3 mEq/L — ABNORMAL LOW (ref 3.5–5.1)
Sodium: 138 mEq/L (ref 135–145)

## 2013-10-18 NOTE — Progress Notes (Signed)
Echocardiogram performed.  

## 2013-10-20 ENCOUNTER — Telehealth: Payer: Self-pay | Admitting: Cardiology

## 2013-10-20 NOTE — Telephone Encounter (Signed)
Gave pt. results

## 2013-10-20 NOTE — Telephone Encounter (Signed)
New message ° ° ° ° ° °Want echo results °

## 2013-12-03 ENCOUNTER — Encounter: Payer: Self-pay | Admitting: General Surgery

## 2013-12-06 ENCOUNTER — Encounter: Payer: Self-pay | Admitting: General Surgery

## 2013-12-06 ENCOUNTER — Encounter: Payer: Self-pay | Admitting: Cardiology

## 2013-12-06 ENCOUNTER — Other Ambulatory Visit: Payer: Self-pay | Admitting: General Surgery

## 2013-12-09 ENCOUNTER — Telehealth: Payer: Self-pay | Admitting: Cardiology

## 2013-12-09 ENCOUNTER — Other Ambulatory Visit: Payer: Self-pay | Admitting: General Surgery

## 2013-12-09 MED ORDER — POTASSIUM CHLORIDE CRYS ER 20 MEQ PO TBCR: 60.0000 meq | EXTENDED_RELEASE_TABLET | Freq: Every day | ORAL | Status: DC

## 2013-12-09 NOTE — Telephone Encounter (Signed)
Updated med list for pt.

## 2013-12-09 NOTE — Progress Notes (Signed)
Is still on diuretic and did he see kidney MD?

## 2013-12-09 NOTE — Telephone Encounter (Signed)
New Prob    Wanted to notify Dr. Radford Pax that he is taking potassium chloride 3 tablets daily as directed.

## 2013-12-10 NOTE — Progress Notes (Signed)
Pt is not taking maxide or diuretic. He did see his kidney Dr.

## 2013-12-11 NOTE — Progress Notes (Signed)
Please have these faxed ASAP to his Kidney MD - I would prefer they follow his low potassium since we do not have him on a diuretic

## 2013-12-13 NOTE — Progress Notes (Signed)
Faxed

## 2014-04-04 ENCOUNTER — Encounter: Payer: Self-pay | Admitting: Cardiology

## 2014-05-31 ENCOUNTER — Ambulatory Visit (INDEPENDENT_AMBULATORY_CARE_PROVIDER_SITE_OTHER): Payer: Medicare Other | Admitting: Cardiology

## 2014-05-31 ENCOUNTER — Encounter: Payer: Self-pay | Admitting: Cardiology

## 2014-05-31 VITALS — BP 142/82 | HR 53 | Ht 68.0 in | Wt 269.3 lb

## 2014-05-31 DIAGNOSIS — I5189 Other ill-defined heart diseases: Secondary | ICD-10-CM

## 2014-05-31 DIAGNOSIS — I10 Essential (primary) hypertension: Secondary | ICD-10-CM

## 2014-05-31 DIAGNOSIS — G4733 Obstructive sleep apnea (adult) (pediatric): Secondary | ICD-10-CM

## 2014-05-31 DIAGNOSIS — I059 Rheumatic mitral valve disease, unspecified: Secondary | ICD-10-CM

## 2014-05-31 DIAGNOSIS — I519 Heart disease, unspecified: Secondary | ICD-10-CM

## 2014-05-31 DIAGNOSIS — I341 Nonrheumatic mitral (valve) prolapse: Secondary | ICD-10-CM

## 2014-05-31 LAB — BASIC METABOLIC PANEL
BUN: 19 mg/dL (ref 6–23)
CO2: 31 mEq/L (ref 19–32)
Calcium: 8.6 mg/dL (ref 8.4–10.5)
Chloride: 102 mEq/L (ref 96–112)
Creatinine, Ser: 1.9 mg/dL — ABNORMAL HIGH (ref 0.4–1.5)
GFR: 37.72 mL/min — ABNORMAL LOW (ref >60.00)
Glucose, Bld: 150 mg/dL — ABNORMAL HIGH (ref 70–99)
Potassium: 3.3 mEq/L — ABNORMAL LOW (ref 3.5–5.1)
Sodium: 138 mEq/L (ref 135–145)

## 2014-05-31 NOTE — Progress Notes (Signed)
[place], [place], [place]  [address] Phone: [phone] Fax:  [phone]  Date:  05/31/2014   ID:  KAWIKA ZAFAR, DOB November 17, 1950, MRN [id number]  PCP:  Simona Huh, MD  Cardiologist:  Fransico Him, MD     History of Present Illness: Peter [name] is a 63 y.o. male with a history of OSA on CPAP, HTN, MVP with mild MR, diastolic dysfunction and morbid obesity. He is doing well with her CPAP therapy. He tolerates her device well. He tolerates his full face mask. He feels the pressure is adequate. He feels rested when he gets up if he goes to bed on time and has no daytime sleepiness. He denies any chest pain, SOB, DOE,  dizziness, palpitations or syncope. He has chronic LE edema which is stable.  He was on Maxide but his kidney MD stopped it.  He is still on potassium   Wt Readings from Last 3 Encounters:  05/31/14 269 lb 4.8 oz (122.154 kg)  10/13/13 262 lb 6.4 oz (119.024 kg)  09/27/13 277 lb 12.8 oz (126.009 kg)     Past Medical History  Diagnosis Date  . MVP (mitral valve prolapse)     Posterior with MR by ECHO  . Hypercholesteremia   . 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 ED (erectile dysfunction)   . OSA (obstructive sleep apnea)     AHI 38/hr on CPAP at 12cm h2o  . Renal oncocytoma of left kidney 2010    w partial nephrectomy  . Kidney stone 2010  . CKD (chronic kidney disease), stage III   . Hypertension     Current Outpatient Prescriptions  Medication Sig Dispense Refill  . amLODipine (NORVASC) 10 MG tablet Take 10 mg by mouth daily.      Marland Kitchen aspirin 81 MG tablet Take 81 mg by mouth daily.      Marland Kitchen atorvastatin (LIPITOR) 10 MG tablet Take 10 mg by mouth daily.      Marland Kitchen lisinopril (PRINIVIL,ZESTRIL) 20 MG tablet Take 20 mg by mouth daily.      . potassium chloride SA (K-DUR,KLOR-CON) 20 MEQ tablet Take 3 tablets (60 mEq total) by mouth daily.      . sildenafil (VIAGRA) 100 MG tablet Take 100 mg by mouth daily as needed for  erectile dysfunction.      Marland Kitchen atenolol (TENORMIN) 50 MG tablet Take 50 mg by mouth daily.       No current facility-administered medications for this visit.    Allergies:    Allergies  Allergen Reactions  . Penicillins     Childhood allergy     Social History:  The patient  reports that he has never smoked. He does not have any smokeless tobacco history on file. He reports that he does not drink alcohol or use illicit drugs.   Family History:  The patient's family history includes Dementia in his mother; Hypertension in his father.   ROS:  Please see the history of present illness.      All other systems reviewed and negative.   PHYSICAL EXAM: VS:  BP 142/82  Pulse 53  Ht 5\' 8"  (1.727 m)  Wt 269 lb 4.8 oz (122.154 kg)  BMI 40.96 kg/m2 Well nourished, well developed, in no acute distress HEENT: normal Neck: no JVD Cardiac:  normal S1, S2; RRR; no murmur Lungs:  clear to auscultation bilaterally, no wheezing, rhonchi or rales Abd: soft, nontender, no hepatomegaly Ext:  no edema Skin: warm and dry Neuro:  CNs 2-12 intact, no focal abnormalities noted  ASSESSMENT AND PLAN:  1. OSA on CPAP  - His download today showed an AHi of 0.8/hr on 12cm H2O and 73% compliance in using more than 4 hours nightly.  He will continue on current settings. 2. HTN - well controlled - continue amlodipine/atenolol/lisinopril - check BMET  - I will get a copy of his last OV note from [facility] to determined if they still wanted him on potassium after stopping diuretic 3.  Morbid obesity - encouraged him to get into a routine exercise program and watch a low fat diet 4.  MVP of the posterior MV leaflet with mild MR by echo 1/15   Followup with me in 6 months       Signed, Fransico Him, MD 05/31/2014 8:44 AM

## 2014-05-31 NOTE — Patient Instructions (Addendum)
Your physician recommends that you continue on your current medications as directed. Please refer to the Current Medication list given to you today.  Your physician recommends that you go to the lab today for a BMET  Your physician wants you to follow-up in: 6 months with Dr Turner You will receive a reminder letter in the mail two months in advance. If you don't receive a letter, please call our office to schedule the follow-up appointment.  

## 2014-06-07 ENCOUNTER — Telehealth: Payer: Self-pay | Admitting: General Surgery

## 2014-06-07 NOTE — Telephone Encounter (Signed)
[facility] called and let us know pt will be doing 20 MEQ 2 tablets Twice a day. They will recheck on Monday 9/14

## 2014-06-20 ENCOUNTER — Encounter: Payer: Self-pay | Admitting: Cardiology

## 2014-09-20 ENCOUNTER — Encounter: Payer: Self-pay | Admitting: Cardiology

## 2014-10-06 ENCOUNTER — Encounter: Payer: Self-pay | Admitting: Cardiology

## 2014-12-05 ENCOUNTER — Ambulatory Visit: Payer: Medicare Other | Admitting: Cardiology

## 2014-12-26 NOTE — Progress Notes (Signed)
Cardiology Office Note   Date:  12/27/2014   ID:  Peter [name], DOB [birth year], MRN [id number]  PCP:  Simona Huh, MD  Cardiologist:   Sueanne Margarita, MD   Chief Complaint  Patient presents with  . Sleep Apnea  . Hypertension  . Mitral Valve Prolapse      History of Present Illness: Peter [name] is a 64y.o. male with a history of OSA on CPAP, HTN, MVP with mild MR, diastolic dysfunction and morbid obesity. He is doing well with his CPAP therapy. He tolerates his device well. He tolerates his full face mask. He feels the pressure is adequate.He has no nasal congestion or nasal/mouth dryness.  He feels rested when he gets up if he goes to bed on time and has no daytime sleepiness. He denies any chest pain, SOB, DOE, dizziness, palpitations or syncope. He has chronic LE edema which is stable.   Past Medical History  Diagnosis Date  . MVP (mitral valve prolapse)     Posterior with MR by ECHO  . Hypercholesteremia   . 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 ED (erectile dysfunction)   . OSA (obstructive sleep apnea)     AHI 38/hr on CPAP at 12cm h2o  . Renal oncocytoma of left kidney 2010    w partial nephrectomy  . Kidney stone 2010  . CKD (chronic kidney disease), stage III   . Hypertension     Past Surgical History  Procedure Laterality Date  . Partial nephrectomy Left 2010     Current Outpatient Prescriptions  Medication Sig Dispense Refill  . amLODipine (NORVASC) 10 MG tablet Take 10 mg by mouth daily.    Marland Kitchen aspirin 81 MG tablet Take 81 mg by mouth daily.    Marland Kitchen atenolol (TENORMIN) 50 MG tablet Take 50 mg by mouth daily.    Marland Kitchen atorvastatin (LIPITOR) 10 MG tablet Take 10 mg by mouth daily.    . hydrochlorothiazide (MICROZIDE) 12.5 MG capsule Take 12.5 mg by mouth daily.    Marland Kitchen lisinopril (PRINIVIL,ZESTRIL) 20 MG tablet Take 20 mg by mouth daily.    . potassium chloride SA (K-DUR,KLOR-CON) 20 MEQ tablet Take 40 mEq by mouth 2 (two) times  daily. [facility] changed on 06/07/14    . sildenafil (VIAGRA) 100 MG tablet Take 100 mg by mouth daily as needed for erectile dysfunction.     No current facility-administered medications for this visit.    Allergies:   Penicillins    Social History:  The patient  reports that he has never smoked. He does not have any smokeless tobacco history on file. He reports that he does not drink alcohol or use illicit drugs.   Family History:  The patient's family history includes Dementia in his mother; Hypertension in his father.    ROS:  Please see the history of present illness.   Otherwise, review of systems are positive for none.   All other systems are reviewed and negative.    PHYSICAL EXAM: VS:  BP 134/72 mmHg  Pulse 58  Ht 5\' 8"  (1.727 m)  Wt 265 lb 9.6 oz (120.475 kg)  BMI 40.39 kg/m2  SpO2 96% , BMI Body mass index is 40.39 kg/(m^2). GEN: Well nourished, well developed, in no acute distress HEENT: normal Neck: no JVD, carotid bruits, or masses Cardiac: RRR; no murmurs, rubs, or gallops,no edema  Respiratory:  clear to auscultation bilaterally, normal work of breathing GI: soft, nontender, nondistended, +  BS MS: no deformity or atrophy Skin: warm and dry, no rash Neuro:  Strength and sensation are intact Psych: euthymic mood, full affect   EKG:  EKG is not ordered today.   Recent Labs: 05/31/2014: BUN 19; Creatinine 1.9*; Potassium 3.3*; Sodium 138    Lipid Panel No results found for: CHOL, TRIG, HDL, CHOLHDL, VLDL, LDLCALC, LDLDIRECT    Wt Readings from Last 3 Encounters:  12/27/14 265 lb 9.6 oz (120.475 kg)  05/31/14 269 lb 4.8 oz (122.154 kg)  10/13/13 262 lb 6.4 oz (119.024 kg)       ASSESSMENT AND PLAN:  1.  OSA on CPAP- He will continue on current settings.  His d/l today showed an AHI of 1.1/hr on 12cm H2O.  His average usage is 4 hours and 48 minutes.   2.  HTN - well controlled - continue amlodipine/atenolol/lisinopril/HCTZ 3. Morbid obesity -  encouraged him to get into a routine exercise program and watch a low fat diet 4. MVP of the posterior MV leaflet with mild MR by echo 1/15  Current medicines are reviewed at length with the patient today.  The patient does not have concerns regarding medicines.  The following changes have been made:  no change  Labs/ tests ordered today include: None No orders of the defined types were placed in this encounter.     Disposition:   FU with me in 6 months   Signed, Sueanne Margarita, MD  12/27/2014 8:56 AM    [facility] [place], [place], [place]  [address] Phone: [phone]; Fax: [phone]

## 2014-12-27 ENCOUNTER — Encounter: Payer: Self-pay | Admitting: Cardiology

## 2014-12-27 ENCOUNTER — Ambulatory Visit (INDEPENDENT_AMBULATORY_CARE_PROVIDER_SITE_OTHER): Payer: Medicare Other | Admitting: Cardiology

## 2014-12-27 VITALS — BP 134/72 | HR 58 | Ht 68.0 in | Wt 265.6 lb

## 2014-12-27 DIAGNOSIS — I341 Nonrheumatic mitral (valve) prolapse: Secondary | ICD-10-CM

## 2014-12-27 DIAGNOSIS — G4733 Obstructive sleep apnea (adult) (pediatric): Secondary | ICD-10-CM | POA: Diagnosis not present

## 2014-12-27 DIAGNOSIS — I10 Essential (primary) hypertension: Secondary | ICD-10-CM | POA: Diagnosis not present

## 2014-12-27 NOTE — Patient Instructions (Signed)
Your physician recommends that you have lab work TODAY (BMET).  Your physician wants you to follow-up in: 6 months with Dr. Radford Pax. You will receive a reminder letter in the mail two months in advance. If you don't receive a letter, please call our office to schedule the follow-up appointment.

## 2015-01-06 ENCOUNTER — Encounter: Payer: Self-pay | Admitting: Cardiology

## 2015-04-06 ENCOUNTER — Encounter: Payer: Self-pay | Admitting: Cardiology

## 2015-06-28 ENCOUNTER — Encounter: Payer: Self-pay | Admitting: Cardiology

## 2015-06-28 ENCOUNTER — Ambulatory Visit (INDEPENDENT_AMBULATORY_CARE_PROVIDER_SITE_OTHER): Payer: Medicare Other | Admitting: Cardiology

## 2015-06-28 ENCOUNTER — Telehealth: Payer: Self-pay

## 2015-06-28 VITALS — BP 130/80 | HR 53 | Ht 68.0 in | Wt 267.8 lb

## 2015-06-28 DIAGNOSIS — I341 Nonrheumatic mitral (valve) prolapse: Secondary | ICD-10-CM | POA: Diagnosis not present

## 2015-06-28 DIAGNOSIS — G4733 Obstructive sleep apnea (adult) (pediatric): Secondary | ICD-10-CM

## 2015-06-28 DIAGNOSIS — I10 Essential (primary) hypertension: Secondary | ICD-10-CM

## 2015-06-28 LAB — BASIC METABOLIC PANEL
BUN: 25 mg/dL — ABNORMAL HIGH (ref 6–23)
CO2: 31 mEq/L (ref 19–32)
Calcium: 9.5 mg/dL (ref 8.4–10.5)
Chloride: 102 mEq/L (ref 96–112)
Creatinine, Ser: 2.18 mg/dL — ABNORMAL HIGH (ref 0.40–1.50)
GFR: 32.27 mL/min — ABNORMAL LOW (ref >60.00)
Glucose, Bld: 192 mg/dL — ABNORMAL HIGH (ref 70–99)
Potassium: 3.3 mEq/L — ABNORMAL LOW (ref 3.5–5.1)
Sodium: 141 mEq/L (ref 135–145)

## 2015-06-28 NOTE — Patient Instructions (Signed)
Medication Instructions:  Your physician recommends that you continue on your current medications as directed. Please refer to the Current Medication list given to you today.   Labwork: TODAY: BMET  Testing/Procedures: Your physician has requested that you have an echocardiogram in January, 2017. Echocardiography is a painless test that uses sound waves to create images of your heart. It provides your doctor with information about the size and shape of your heart and how well your heart's chambers and valves are working. This procedure takes approximately one hour. There are no restrictions for this procedure.   Follow-Up: Your physician wants you to follow-up in: 1 year with Dr. Radford Pax. You will receive a reminder letter in the mail two months in advance. If you don't receive a letter, please call our office to schedule the follow-up appointment.   Any Other Special Instructions Will Be Listed Below (If Applicable).

## 2015-06-28 NOTE — Progress Notes (Signed)
Cardiology Office Note   Date:  06/28/2015   ID:  Peter [name], DOB March 09, 1951, MRN [id number]  PCP:  Simona Huh, MD    Chief Complaint  Patient presents with  . OSA      History of Present Illness: Peter [name] is a 64y.o. male with a history of OSA on CPAP, HTN, MVP with mild MR, diastolic dysfunction and morbid obesity. He is doing well with his CPAP therapy. He tolerates his device well. He tolerates his full face mask. He feels the pressure is adequate.He has no nasal congestion or nasal/mouth dryness. He feels rested when he gets up if he goes to bed on time and has no daytime sleepiness. He denies any chest pain, SOB, DOE, dizziness, palpitations or syncope. He has chronic LE edema which is stable. He walks for exercise.      Past Medical History  Diagnosis Date  . MVP (mitral valve prolapse)     Posterior with MR by ECHO  . Hypercholesteremia   . 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 ED (erectile dysfunction)   . OSA (obstructive sleep apnea)     AHI 38/hr on CPAP at 12cm h2o  . Renal oncocytoma of left kidney 2010    w partial nephrectomy  . Kidney stone 2010  . CKD (chronic kidney disease), stage III   . Hypertension     Past Surgical History  Procedure Laterality Date  . Partial nephrectomy Left 2010     Current Outpatient Prescriptions  Medication Sig Dispense Refill  . amLODipine (NORVASC) 10 MG tablet Take 10 mg by mouth daily.    Marland Kitchen aspirin 81 MG tablet Take 81 mg by mouth daily.    Marland Kitchen atenolol (TENORMIN) 50 MG tablet Take 50 mg by mouth daily.    Marland Kitchen atorvastatin (LIPITOR) 10 MG tablet Take 10 mg by mouth daily.    . hydrochlorothiazide (MICROZIDE) 12.5 MG capsule Take 12.5 mg by mouth daily.    Marland Kitchen lisinopril (PRINIVIL,ZESTRIL) 20 MG tablet Take 20 mg by mouth daily.    . potassium chloride SA (K-DUR,KLOR-CON) 20 MEQ tablet Take 40 mEq by mouth 2 (two) times daily. [place] [facility] changed on 06/07/14      . sildenafil (VIAGRA) 100 MG tablet Take 100 mg by mouth daily as needed for erectile dysfunction.     No current facility-administered medications for this visit.    Allergies:   Penicillins    Social History:  The patient  reports that he has never smoked. He does not have any smokeless tobacco history on file. He reports that he does not drink alcohol or use illicit drugs.   Family History:  The patient's family history includes Dementia in his mother; Hypertension in his father.    ROS:  Please see the history of present illness.   Otherwise, review of systems are positive for none.   All other systems are reviewed and negative.    PHYSICAL EXAM: VS:  BP 130/80 mmHg  Pulse 53  Ht 5\' 8"  (1.727 m)  Wt 267 lb 12.8 oz (121.473 kg)  BMI 40.73 kg/m2  SpO2 97% , BMI Body mass index is 40.73 kg/(m^2). GEN: Well nourished, well developed, in no acute distress HEENT: normal Neck: no JVD, carotid bruits, or masses Cardiac: RRR; no murmurs, rubs, or gallops,no edema  Respiratory:  clear to auscultation bilaterally, normal work  of breathing GI: soft, nontender, nondistended, + BS MS: no deformity or atrophy Skin: warm and dry, no rash Neuro:  Strength and sensation are intact Psych: euthymic mood, full affect   EKG:  EKG is not ordered today.    Recent Labs: No results found for requested labs within last 365 days.    Lipid Panel No results found for: CHOL, TRIG, HDL, CHOLHDL, VLDL, LDLCALC, LDLDIRECT    Wt Readings from Last 3 Encounters:  06/28/15 267 lb 12.8 oz (121.473 kg)  12/27/14 265 lb 9.6 oz (120.475 kg)  05/31/14 269 lb 4.8 oz (122.154 kg)    ASSESSMENT AND PLAN:  1. OSA on CPAP- He will continue on current settings. His d/l today showed an AHI of 2/hr on 12cm H2O. He is 70% compliant in using more than 4 hours nightly.   2. HTN - well controlled - continue amlodipine/atenolol/lisinopril/HCTZ - check BMET 3. Morbid obesity - encouraged him to get into  a routine exercise program and watch a low fat diet 4. MVP of the posterior MV leaflet with mild MR by echo 1/15 - recheck echo in 10/2015    Current medicines are reviewed at length with the patient today.  The patient does not have concerns regarding medicines.  The following changes have been made:  no change  Labs/ tests ordered today: See above Assessment and Plan No orders of the defined types were placed in this encounter.     Disposition:   FU with me in 1 years  Signed, Sueanne Margarita, MD  06/28/2015 8:32 AM    [facility] [place], [place],   [address] Phone: [phone]; Fax: [phone]

## 2015-06-28 NOTE — Telephone Encounter (Signed)
Informed patient of results and verbal understanding expressed.  Instructed patient to STOP HCTZ and increase potassium rich foods in his diet. Repeat BMET scheduled for Oct. 3. Patient agrees with treatment plan.

## 2015-06-28 NOTE — Telephone Encounter (Signed)
-----   Message from Sueanne Margarita, MD sent at 06/28/2015  5:01 PM EDT ----- Creatinine increased.  Please stop HCTZ.  Increase potassium rich foods and recheck BMET on Monday 10/3

## 2015-07-03 ENCOUNTER — Other Ambulatory Visit (INDEPENDENT_AMBULATORY_CARE_PROVIDER_SITE_OTHER): Payer: Medicare Other | Admitting: *Deleted

## 2015-07-03 DIAGNOSIS — I10 Essential (primary) hypertension: Secondary | ICD-10-CM

## 2015-07-03 LAB — BASIC METABOLIC PANEL
BUN: 20 mg/dL (ref 6–23)
CO2: 28 mEq/L (ref 19–32)
Calcium: 8.4 mg/dL (ref 8.4–10.5)
Chloride: 103 mEq/L (ref 96–112)
Creatinine, Ser: 2.21 mg/dL — ABNORMAL HIGH (ref 0.40–1.50)
GFR: 31.77 mL/min — ABNORMAL LOW (ref >60.00)
Glucose, Bld: 269 mg/dL — ABNORMAL HIGH (ref 70–99)
Potassium: 4 mEq/L (ref 3.5–5.1)
Sodium: 139 mEq/L (ref 135–145)

## 2015-07-13 ENCOUNTER — Encounter: Payer: Self-pay | Admitting: Cardiology

## 2015-10-24 ENCOUNTER — Other Ambulatory Visit: Payer: Self-pay

## 2015-10-24 ENCOUNTER — Ambulatory Visit (HOSPITAL_COMMUNITY): Payer: Medicare Other | Attending: Cardiovascular Disease

## 2015-10-24 DIAGNOSIS — I10 Essential (primary) hypertension: Secondary | ICD-10-CM | POA: Diagnosis not present

## 2015-10-24 DIAGNOSIS — I517 Cardiomegaly: Secondary | ICD-10-CM | POA: Insufficient documentation

## 2015-10-24 DIAGNOSIS — I341 Nonrheumatic mitral (valve) prolapse: Secondary | ICD-10-CM | POA: Insufficient documentation

## 2015-10-24 DIAGNOSIS — I340 Nonrheumatic mitral (valve) insufficiency: Secondary | ICD-10-CM | POA: Insufficient documentation

## 2015-10-24 DIAGNOSIS — G4733 Obstructive sleep apnea (adult) (pediatric): Secondary | ICD-10-CM | POA: Insufficient documentation

## 2015-10-24 DIAGNOSIS — Z6841 Body Mass Index (BMI) 40.0 and over, adult: Secondary | ICD-10-CM | POA: Diagnosis not present

## 2016-01-26 ENCOUNTER — Other Ambulatory Visit: Payer: Self-pay | Admitting: Nephrology

## 2016-01-26 DIAGNOSIS — N183 Chronic kidney disease, stage 3 unspecified: Secondary | ICD-10-CM

## 2016-01-30 ENCOUNTER — Ambulatory Visit
Admission: RE | Admit: 2016-01-30 | Discharge: 2016-01-30 | Disposition: A | Discharge status: Home / Self Care | Payer: Medicare Other | Source: Ambulatory Visit | Attending: Nephrology | Admitting: Nephrology

## 2016-01-30 DIAGNOSIS — N183 Chronic kidney disease, stage 3 unspecified: Secondary | ICD-10-CM

## 2016-04-23 ENCOUNTER — Other Ambulatory Visit: Payer: Self-pay | Admitting: Vascular Surgery

## 2016-04-23 DIAGNOSIS — Z01810 Encounter for preprocedural cardiovascular examination: Secondary | ICD-10-CM

## 2016-04-23 DIAGNOSIS — N184 Chronic kidney disease, stage 4 (severe): Secondary | ICD-10-CM

## 2016-04-30 ENCOUNTER — Encounter: Payer: Self-pay | Admitting: Cardiology

## 2016-05-20 ENCOUNTER — Encounter: Payer: Self-pay | Admitting: Vascular Surgery

## 2016-05-22 ENCOUNTER — Ambulatory Visit (INDEPENDENT_AMBULATORY_CARE_PROVIDER_SITE_OTHER): Payer: Medicare Other | Admitting: Vascular Surgery

## 2016-05-22 ENCOUNTER — Ambulatory Visit (HOSPITAL_COMMUNITY)
Admission: RE | Admit: 2016-05-22 | Discharge: 2016-05-22 | Disposition: A | Discharge status: Home / Self Care | Payer: Medicare Other | Source: Ambulatory Visit | Attending: Vascular Surgery | Admitting: Vascular Surgery

## 2016-05-22 ENCOUNTER — Ambulatory Visit (INDEPENDENT_AMBULATORY_CARE_PROVIDER_SITE_OTHER)
Admission: RE | Admit: 2016-05-22 | Discharge: 2016-05-22 | Disposition: A | Discharge status: Home / Self Care | Payer: Medicare Other | Source: Ambulatory Visit | Attending: Vascular Surgery | Admitting: Vascular Surgery

## 2016-05-22 ENCOUNTER — Encounter: Payer: Self-pay | Admitting: Vascular Surgery

## 2016-05-22 VITALS — BP 173/96 | HR 47 | Temp 98.0°F | Resp 16 | Ht 68.0 in | Wt 267.0 lb

## 2016-05-22 DIAGNOSIS — Z01810 Encounter for preprocedural cardiovascular examination: Secondary | ICD-10-CM

## 2016-05-22 DIAGNOSIS — E7800 Pure hypercholesterolemia, unspecified: Secondary | ICD-10-CM | POA: Insufficient documentation

## 2016-05-22 DIAGNOSIS — N184 Chronic kidney disease, stage 4 (severe): Secondary | ICD-10-CM

## 2016-05-22 DIAGNOSIS — G4733 Obstructive sleep apnea (adult) (pediatric): Secondary | ICD-10-CM | POA: Diagnosis not present

## 2016-05-22 DIAGNOSIS — E1122 Type 2 diabetes mellitus with diabetic chronic kidney disease: Secondary | ICD-10-CM | POA: Diagnosis not present

## 2016-05-22 DIAGNOSIS — I129 Hypertensive chronic kidney disease with stage 1 through stage 4 chronic kidney disease, or unspecified chronic kidney disease: Secondary | ICD-10-CM | POA: Diagnosis not present

## 2016-05-22 NOTE — Progress Notes (Signed)
  Patient name: Peter [name] MRN: [id number] DOB: 12/07/1950 Sex: male  REASON FOR CONSULT: Evaluate for AV fistula. Referred by Dr. Cynthia Dunham.  HPI: Peter [name] is a 65 y.o. male, who was referred for evaluation for an AV fistula. The patient denies any recent uremic symptoms. Specifically, he denies any history of, nausea, vomiting, fatigue, or anorexia. He is right-handed.  I have reviewed the records that were sent with the patient. In May 2017 GFR was 17. The patient has stage IV chronic kidney disease. The patient has a history of diabetes, hypertension, and a partial left nephrectomy in 2010 for a renal oncocytoma. Of note, if he is not a candidate for a fistula that we've been asked to wait before placing an AV graft.  Past Medical History:  Diagnosis Date  . CKD (chronic kidney disease), stage III   . Diabetes mellitus without complication (HCC) 03/2012   type II  . ED (erectile dysfunction)   . Hypercholesteremia   . Hypertension   . Kidney stone 2010  . 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 OSA (obstructive sleep apnea)    AHI 38/hr on CPAP at 12cm h2o  . 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    Family History  Problem Relation Age of Onset  . Dementia Mother   . Hypertension Father     SOCIAL HISTORY: Social History   Social History  . Marital status: Divorced    Spouse name: N/A  . Number of children: N/A  . Years of education: N/A   Occupational History  . Not on file.   Social History Main Topics  . Smoking status: Never Smoker  . Smokeless tobacco: Not on file  . Alcohol use No  . Drug use: No  . Sexual activity: Not on file   Other Topics Concern  . Not on file   Social History Narrative  . No narrative on file    Allergies  Allergen Reactions  . Penicillins     Childhood allergy     Current Outpatient Prescriptions  Medication Sig Dispense Refill  . amLODipine (NORVASC) 10 MG  tablet Take 10 mg by mouth daily.    . aspirin 81 MG tablet Take 81 mg by mouth daily.    . atenolol (TENORMIN) 50 MG tablet Take 100 mg by mouth daily.     . atorvastatin (LIPITOR) 10 MG tablet Take 10 mg by mouth daily.    . calcitRIOL (ROCALTROL) 0.25 MCG capsule Take 0.25 mcg by mouth daily.    . furosemide (LASIX) 40 MG tablet Take 40 mg by mouth.    . glimepiride (AMARYL) 1 MG tablet Take 1 mg by mouth daily with breakfast.    . lisinopril (PRINIVIL,ZESTRIL) 20 MG tablet Take 20 mg by mouth daily.    . potassium chloride SA (K-DUR,KLOR-CON) 20 MEQ tablet Take 40 mEq by mouth 2 (two) times daily. [place] kidney changed on 06/07/14    . sildenafil (VIAGRA) 100 MG tablet Take 100 mg by mouth daily as needed for erectile dysfunction.     No current facility-administered medications for this visit.     REVIEW OF SYSTEMS:  [X] denotes positive finding, [ ] denotes negative finding Cardiac  Comments:  Chest pain or chest pressure:    Shortness of breath upon exertion:    Short of breath when lying flat:    Irregular heart rhythm:          Vascular    Pain in calf, thigh, or hip brought on by ambulation:    Pain in feet at night that wakes you up from your sleep:     Blood clot in your veins:    Leg swelling:         Pulmonary    Oxygen at home:    Productive cough:     Wheezing:         Neurologic    Sudden weakness in arms or legs:     Sudden numbness in arms or legs:     Sudden onset of difficulty speaking or slurred speech:    Temporary loss of vision in one eye:     Problems with dizziness:         Gastrointestinal    Blood in stool:     Vomited blood:         Genitourinary    Burning when urinating:     Blood in urine:        Psychiatric    Major depression:         Hematologic    Bleeding problems:    Problems with blood clotting too easily:        Skin    Rashes or ulcers:        Constitutional    Fever or chills:      PHYSICAL EXAM: Vitals:    05/22/16 1120 05/22/16 1124  BP: (!) 169/90 (!) 173/96  Pulse: (!) 47   Resp: 16   Temp: 98 F (36.7 C)   TempSrc: Oral   SpO2: 99%   Weight: 267 lb (121.1 kg)   Height: 5' 8" (1.727 m)     GENERAL: The patient is a well-nourished male, in no acute distress. The vital signs are documented above. CARDIAC: There is a regular rate and rhythm.  VASCULAR: I do not detect carotid bruits. Has palpable radial pulses bilaterally. PULMONARY: There is good air exchange bilaterally without wheezing or rales. ABDOMEN: Soft and non-tender with normal pitched bowel sounds.  MUSCULOSKELETAL: There are no major deformities or cyanosis. NEUROLOGIC: No focal weakness or paresthesias are detected. SKIN: There are no ulcers or rashes noted. PSYCHIATRIC: The patient has a normal affect.  DATA:   UPPER EXTREMITY VEIN MAP: I have independently interpreted her upper extremity vein map.  On the left side, the upper arm cephalic vein, and potentially the forearm cephalic vein could be adequate for fistula. The forearm cephalic vein is somewhat small.  On the right side the upper arm cephalic vein appears to be adequate for a fistula.  UPPER EXTREMITY ARTERIAL DUPLEX: I have independently interpreted her upper extremity arterial duplex can. The patient has triphasic Doppler signals in the radial and ulnar positions bilaterally.  MEDICAL ISSUES:  STAGE IV CHRONIC KIDNEY DISEASE:  He appears to be a good candidate for a left brachiocephalic AV fistula or possibly a radial cephalic AV fistula. Of note, if he is not a candidate for a fistula that we've been asked to wait before placing an AV graft. I have discussed the indications for the procedure and the potential complications. All of his questions were answered and he is agreeable to proceed. He's having to work around his wife's work schedule and the earliest he can schedule this is 06/11/2016.  HYPERTENSION: The patient's initial blood pressure today was  elevated. We repeated this and this was still elevated. We have encouraged the patient to follow up with their primary care physician   for management of their blood pressure.   Dickson, Christopher [facility] [phone]   

## 2016-05-27 ENCOUNTER — Other Ambulatory Visit: Payer: Self-pay | Admitting: *Deleted

## 2016-06-13 ENCOUNTER — Encounter (HOSPITAL_COMMUNITY): Payer: Self-pay | Admitting: *Deleted

## 2016-06-13 MED ORDER — VANCOMYCIN HCL 10 G IV SOLR
1500.0000 mg | INTRAVENOUS | Status: AC
  Administered 2016-06-14: 1500 mg via INTRAVENOUS
  Filled 2016-06-13: qty 1500

## 2016-06-13 NOTE — Progress Notes (Signed)
Pt has history of mitral valve prolapse. Cardiologist is Dr. Fransico Him. Pt denies chest pain or sob. Pt is diabetic, states he can't remember what his fasting blood sugars run, doesn't take them often. His last A1C was 5.7 on 04/26/16.

## 2016-06-13 NOTE — Anesthesia Preprocedure Evaluation (Addendum)
Anesthesia Evaluation  Patient identified by MRN, date of birth, ID band Patient awake    Reviewed: Allergy & Precautions, NPO status , Patient's Chart, lab work & pertinent test results  Airway Mallampati: II  TM Distance: >3 FB Neck ROM: Full    Dental  (+) Teeth Intact, Dental Advisory Given   Pulmonary sleep apnea , former smoker,    breath sounds clear to auscultation       Cardiovascular hypertension, Pt. on medications and Pt. on home beta blockers  Rhythm:Regular Rate:Normal     Neuro/Psych negative neurological ROS  negative psych ROS   GI/Hepatic negative GI ROS, Neg liver ROS,   Endo/Other  diabetes, Type 2, Oral Hypoglycemic Agents  Renal/GU ESRFRenal disease  negative genitourinary   Musculoskeletal negative musculoskeletal ROS (+)   Abdominal (+) + obese,   Peds negative pediatric ROS (+)  Hematology negative hematology ROS (+)   Anesthesia Other Findings   Reproductive/Obstetrics negative OB ROS                            Lab Results  Component Value Date   WBC 15.1 (H) 05/26/2009   HGB 12.4 (L) 05/27/2009   HCT 35.9 (L) 05/27/2009   MCV 91.0 05/26/2009   PLT 183 05/26/2009   Lab Results  Component Value Date   CREATININE 2.21 (H) 07/03/2015   BUN 20 07/03/2015   NA 139 07/03/2015   K 4.0 07/03/2015   CL 103 07/03/2015   CO2 28 07/03/2015   Lab Results  Component Value Date   INR 1.9 (H) 03/31/2008   INR 1.5 03/30/2008   INR 1.0 03/29/2008     Anesthesia Physical Anesthesia Plan  ASA: III  Anesthesia Plan: MAC   Post-op Pain Management:    Induction: Intravenous  Airway Management Planned: Natural Airway  Additional Equipment:   Intra-op Plan:   Post-operative Plan:   Informed Consent: I have reviewed the patients History and Physical, chart, labs and discussed the procedure including the risks, benefits and alternatives for the proposed  anesthesia with the patient or authorized representative who has indicated his/her understanding and acceptance.     Plan Discussed with:   Anesthesia Plan Comments:         Anesthesia Quick Evaluation

## 2016-06-14 ENCOUNTER — Encounter (HOSPITAL_COMMUNITY): Payer: Self-pay | Admitting: Certified Registered Nurse Anesthetist

## 2016-06-14 ENCOUNTER — Other Ambulatory Visit: Payer: Self-pay | Admitting: *Deleted

## 2016-06-14 ENCOUNTER — Ambulatory Visit (HOSPITAL_COMMUNITY)
Admission: RE | Admit: 2016-06-14 | Discharge: 2016-06-14 | Disposition: A | Discharge status: Home / Self Care | Payer: Medicare Other | Source: Ambulatory Visit | Attending: Vascular Surgery | Admitting: Vascular Surgery

## 2016-06-14 ENCOUNTER — Encounter (HOSPITAL_COMMUNITY)
Admission: RE | Disposition: A | Discharge status: Home / Self Care | Payer: Self-pay | Source: Ambulatory Visit | Attending: Vascular Surgery

## 2016-06-14 ENCOUNTER — Ambulatory Visit (HOSPITAL_COMMUNITY): Payer: Medicare Other | Admitting: Anesthesiology

## 2016-06-14 DIAGNOSIS — Z7984 Long term (current) use of oral hypoglycemic drugs: Secondary | ICD-10-CM | POA: Insufficient documentation

## 2016-06-14 DIAGNOSIS — Z880 Allergy status to penicillin: Secondary | ICD-10-CM | POA: Insufficient documentation

## 2016-06-14 DIAGNOSIS — N186 End stage renal disease: Secondary | ICD-10-CM | POA: Diagnosis not present

## 2016-06-14 DIAGNOSIS — Z7982 Long term (current) use of aspirin: Secondary | ICD-10-CM | POA: Diagnosis not present

## 2016-06-14 DIAGNOSIS — E1122 Type 2 diabetes mellitus with diabetic chronic kidney disease: Secondary | ICD-10-CM | POA: Insufficient documentation

## 2016-06-14 DIAGNOSIS — I341 Nonrheumatic mitral (valve) prolapse: Secondary | ICD-10-CM | POA: Insufficient documentation

## 2016-06-14 DIAGNOSIS — Z4931 Encounter for adequacy testing for hemodialysis: Secondary | ICD-10-CM

## 2016-06-14 DIAGNOSIS — Z992 Dependence on renal dialysis: Secondary | ICD-10-CM | POA: Diagnosis not present

## 2016-06-14 DIAGNOSIS — G4733 Obstructive sleep apnea (adult) (pediatric): Secondary | ICD-10-CM | POA: Insufficient documentation

## 2016-06-14 DIAGNOSIS — I120 Hypertensive chronic kidney disease with stage 5 chronic kidney disease or end stage renal disease: Secondary | ICD-10-CM | POA: Diagnosis not present

## 2016-06-14 DIAGNOSIS — Z79899 Other long term (current) drug therapy: Secondary | ICD-10-CM | POA: Diagnosis not present

## 2016-06-14 DIAGNOSIS — Z87891 Personal history of nicotine dependence: Secondary | ICD-10-CM | POA: Diagnosis not present

## 2016-06-14 DIAGNOSIS — N184 Chronic kidney disease, stage 4 (severe): Secondary | ICD-10-CM | POA: Diagnosis not present

## 2016-06-14 HISTORY — PX: AV FISTULA PLACEMENT: SHX1204

## 2016-06-14 LAB — GLUCOSE, CAPILLARY
Glucose-Capillary: 103 mg/dL — ABNORMAL HIGH (ref 65–99)
Glucose-Capillary: 100 mg/dL — ABNORMAL HIGH (ref 65–99)

## 2016-06-14 LAB — POCT I-STAT 4, (NA,K, GLUC, HGB,HCT)
Glucose, Bld: 105 mg/dL — ABNORMAL HIGH (ref 65–99)
HCT: 32 % — ABNORMAL LOW (ref 39.0–52.0)
Hemoglobin: 10.9 g/dL — ABNORMAL LOW (ref 13.0–17.0)
Potassium: 3.2 mmol/L — ABNORMAL LOW (ref 3.5–5.1)
Sodium: 141 mmol/L (ref 135–145)

## 2016-06-14 SURGERY — ARTERIOVENOUS (AV) FISTULA CREATION
Anesthesia: Monitor Anesthesia Care | Site: Arm Upper | Laterality: Left

## 2016-06-14 MED ORDER — PROPOFOL 1000 MG/100ML IV EMUL
INTRAVENOUS | Status: AC
  Filled 2016-06-14: qty 200

## 2016-06-14 MED ORDER — PROTAMINE SULFATE 10 MG/ML IV SOLN
INTRAVENOUS | Status: DC | PRN
  Administered 2016-06-14: 10 mg via INTRAVENOUS
  Administered 2016-06-14: 40 mg via INTRAVENOUS

## 2016-06-14 MED ORDER — LIDOCAINE HCL (PF) 1 % IJ SOLN
INTRAMUSCULAR | Status: AC
  Filled 2016-06-14: qty 30

## 2016-06-14 MED ORDER — SODIUM CHLORIDE 0.9 % IV SOLN
INTRAVENOUS | Status: DC | PRN
  Administered 2016-06-14: 500 mL

## 2016-06-14 MED ORDER — PROPOFOL 10 MG/ML IV BOLUS
INTRAVENOUS | Status: DC | PRN
  Administered 2016-06-14: 30 mg via INTRAVENOUS

## 2016-06-14 MED ORDER — PROMETHAZINE HCL 25 MG/ML IJ SOLN: 6.2500 mg | INTRAMUSCULAR | Status: DC | PRN

## 2016-06-14 MED ORDER — EPHEDRINE SULFATE 50 MG/ML IJ SOLN
INTRAMUSCULAR | Status: DC | PRN
  Administered 2016-06-14 (×4): 10 mg via INTRAVENOUS

## 2016-06-14 MED ORDER — ONDANSETRON HCL 4 MG/2ML IJ SOLN
INTRAMUSCULAR | Status: DC | PRN
  Administered 2016-06-14: 4 mg via INTRAVENOUS

## 2016-06-14 MED ORDER — FENTANYL CITRATE (PF) 100 MCG/2ML IJ SOLN
INTRAMUSCULAR | Status: DC | PRN
Start: 2016-06-14 — End: 2016-06-14
  Administered 2016-06-14 (×2): 25 ug via INTRAVENOUS
  Administered 2016-06-14: 50 ug via INTRAVENOUS

## 2016-06-14 MED ORDER — PROPOFOL 10 MG/ML IV BOLUS
INTRAVENOUS | Status: AC
  Filled 2016-06-14: qty 20

## 2016-06-14 MED ORDER — 0.9 % SODIUM CHLORIDE (POUR BTL) OPTIME
TOPICAL | Status: DC | PRN
  Administered 2016-06-14: 1000 mL

## 2016-06-14 MED ORDER — FENTANYL CITRATE (PF) 100 MCG/2ML IJ SOLN: 25.0000 ug | INTRAMUSCULAR | Status: DC | PRN

## 2016-06-14 MED ORDER — CHLORHEXIDINE GLUCONATE CLOTH 2 % EX PADS: 6.0000 | MEDICATED_PAD | Freq: Once | CUTANEOUS | Status: DC

## 2016-06-14 MED ORDER — THROMBIN 20000 UNITS EX SOLR
CUTANEOUS | Status: AC
  Filled 2016-06-14: qty 20000

## 2016-06-14 MED ORDER — PROPOFOL 500 MG/50ML IV EMUL
INTRAVENOUS | Status: DC | PRN
  Administered 2016-06-14: 75 ug/kg/min via INTRAVENOUS

## 2016-06-14 MED ORDER — HEPARIN SODIUM (PORCINE) 1000 UNIT/ML IJ SOLN
INTRAMUSCULAR | Status: DC | PRN
  Administered 2016-06-14: 9000 [IU] via INTRAVENOUS

## 2016-06-14 MED ORDER — PHENYLEPHRINE 40 MCG/ML (10ML) SYRINGE FOR IV PUSH (FOR BLOOD PRESSURE SUPPORT)
PREFILLED_SYRINGE | INTRAVENOUS | Status: AC
  Filled 2016-06-14: qty 10

## 2016-06-14 MED ORDER — ONDANSETRON HCL 4 MG/2ML IJ SOLN
INTRAMUSCULAR | Status: AC
  Filled 2016-06-14: qty 2

## 2016-06-14 MED ORDER — PAPAVERINE HCL 30 MG/ML IJ SOLN
INTRAMUSCULAR | Status: AC
  Filled 2016-06-14: qty 2

## 2016-06-14 MED ORDER — MEPERIDINE HCL 25 MG/ML IJ SOLN: 6.2500 mg | INTRAMUSCULAR | Status: DC | PRN

## 2016-06-14 MED ORDER — FENTANYL CITRATE (PF) 100 MCG/2ML IJ SOLN
INTRAMUSCULAR | Status: AC
  Filled 2016-06-14: qty 2

## 2016-06-14 MED ORDER — SODIUM CHLORIDE 0.9 % IV SOLN
INTRAVENOUS | Status: DC
  Administered 2016-06-14 (×2): via INTRAVENOUS

## 2016-06-14 MED ORDER — OXYMETAZOLINE HCL 0.05 % NA SOLN
NASAL | Status: DC | PRN
  Administered 2016-06-14: 1 via NASAL

## 2016-06-14 MED ORDER — OXYCODONE-ACETAMINOPHEN 5-325 MG PO TABS: 1.0000 | ORAL_TABLET | ORAL | 0 refills | Status: DC | PRN

## 2016-06-14 MED ORDER — LACTATED RINGERS IV SOLN: INTRAVENOUS | Status: DC

## 2016-06-14 MED ORDER — LIDOCAINE HCL (CARDIAC) 20 MG/ML IV SOLN
INTRAVENOUS | Status: DC | PRN
  Administered 2016-06-14: 50 mg via INTRAVENOUS

## 2016-06-14 MED ORDER — OXYMETAZOLINE HCL 0.05 % NA SOLN
NASAL | Status: AC
  Filled 2016-06-14: qty 15

## 2016-06-14 MED ORDER — LIDOCAINE HCL (PF) 1 % IJ SOLN
INTRAMUSCULAR | Status: DC | PRN
  Administered 2016-06-14: 35 mL

## 2016-06-14 SURGICAL SUPPLY — 36 items
ARMBAND PINK RESTRICT EXTREMIT (MISCELLANEOUS) ×2 IMPLANT
BANDAGE ACE 4X5 VEL STRL LF (GAUZE/BANDAGES/DRESSINGS) ×2 IMPLANT
BNDG GAUZE ELAST 4 BULKY (GAUZE/BANDAGES/DRESSINGS) ×2 IMPLANT
CANISTER SUCTION 2500CC (MISCELLANEOUS) ×2 IMPLANT
CANNULA VESSEL 3MM 2 BLNT TIP (CANNULA) ×2 IMPLANT
CLIP TI MEDIUM 6 (CLIP) ×2 IMPLANT
CLIP TI WIDE RED SMALL 6 (CLIP) ×6 IMPLANT
COVER PROBE W GEL 5X96 (DRAPES) ×2 IMPLANT
DECANTER SPIKE VIAL GLASS SM (MISCELLANEOUS) IMPLANT
ELECT REM PT RETURN 9FT ADLT (ELECTROSURGICAL) ×2
ELECTRODE REM PT RTRN 9FT ADLT (ELECTROSURGICAL) ×1 IMPLANT
GLOVE BIO SURGEON STRL SZ 6 (GLOVE) ×2 IMPLANT
GLOVE BIO SURGEON STRL SZ7.5 (GLOVE) ×2 IMPLANT
GLOVE BIOGEL PI IND STRL 6.5 (GLOVE) ×2 IMPLANT
GLOVE BIOGEL PI IND STRL 8 (GLOVE) ×1 IMPLANT
GLOVE BIOGEL PI INDICATOR 6.5 (GLOVE) ×2
GLOVE BIOGEL PI INDICATOR 8 (GLOVE) ×1
GOWN STRL REUS W/ TWL LRG LVL3 (GOWN DISPOSABLE) ×3 IMPLANT
GOWN STRL REUS W/TWL LRG LVL3 (GOWN DISPOSABLE) ×3
KIT BASIN OR (CUSTOM PROCEDURE TRAY) ×2 IMPLANT
KIT ROOM TURNOVER OR (KITS) ×2 IMPLANT
LIQUID BAND (GAUZE/BANDAGES/DRESSINGS) ×2 IMPLANT
NS IRRIG 1000ML POUR BTL (IV SOLUTION) ×2 IMPLANT
PACK CV ACCESS (CUSTOM PROCEDURE TRAY) ×2 IMPLANT
PAD ARMBOARD 7.5X6 YLW CONV (MISCELLANEOUS) ×4 IMPLANT
SPONGE GAUZE 4X4 12PLY STER LF (GAUZE/BANDAGES/DRESSINGS) ×2 IMPLANT
SPONGE SURGIFOAM ABS GEL 100 (HEMOSTASIS) IMPLANT
SUT PROLENE 6 0 BV (SUTURE) ×4 IMPLANT
SUT SILK 4 0 (SUTURE) ×1
SUT SILK 4-0 18XBRD TIE 12 (SUTURE) ×1 IMPLANT
SUT VIC AB 3-0 SH 27 (SUTURE) ×1
SUT VIC AB 3-0 SH 27X BRD (SUTURE) ×1 IMPLANT
SUT VIC AB 4-0 PS2 27 (SUTURE) ×2 IMPLANT
SUT VICRYL 4-0 PS2 18IN ABS (SUTURE) ×2 IMPLANT
UNDERPAD 30X30 (UNDERPADS AND DIAPERS) ×2 IMPLANT
WATER STERILE IRR 1000ML POUR (IV SOLUTION) IMPLANT

## 2016-06-14 NOTE — Op Note (Signed)
    NAME: [name]   MRN: [id number] DOB: 10/16/1950    DATE OF OPERATION: 06/14/2016  PREOP DIAGNOSIS: Stage IV chronic kidney disease  POSTOP DIAGNOSIS: Same  PROCEDURE:  1. Left brachiocephalic AV fistula 2. Superficialization of  Left AVF  SURGEON: Judeth Cornfield. Scot Dock, MD, FACS  ASSIST: Izetta Dakin RNFA  ANESTHESIA: Local with sedation   EBL: Minimal  INDICATIONS: Peter [name] is a 65 y.o. male who is not yet on dialysis. He presents for new access.  FINDINGS: 4 mm upper arm cephalic vein. The vein was very deep and I superficialized the lower half of the fistula.  TECHNIQUE: The patient was taken to the operating room and sedated by anesthesia. The left upper extremity was prepped and draped in the usual sterile fashion. After the skin was anesthetized with 1% lidocaine a transverse incision was made above the antecubital level. Here the cephalic vein was dissected free. It was fairly deep. Branches were divided between clips and 3-0 silk ties. I mobilized the vein enough distally to have adequate length for anastomosis to the brachial artery. The vein was ligated distally and irrigated out with heparinized saline. The brachial artery was dissected free beneath the fascia. This was also quite deep. The patient was heparinized. The brachial artery was clamped proximally and distally and a longitudinal arteriotomy was made. The vein was sewn end-to-side to the artery using continuous 6-0 Prolene suture. At the completion was an excellent thrill in the fistula. I then removed some fat overlying the fistula through this incision as the vein was quite deep. After the skin was anesthetized, I made a separate longitudinal incision in the fistula in the mid upper arm. Again I removed fat overlying the fistula and also mobilized the vein so that it could float closer to the surface. Hemostasis was obtained in the wounds. The incision in the mid upper arm was closed with 4-0 Vicryl. The  incision at the antecubital area was closed with a deeper 3-0 Vicryl and the skin closed with 4-0 Vicryl. Liquiband was applied. A dressing was then applied. The patient tolerated the procedure well and was transferred to the recovery room in stable condition. All needle and sponge counts were correct.  Deitra Mayo, MD, FACS [facility]  DATE OF DICTATION:   06/14/2016

## 2016-06-14 NOTE — Anesthesia Postprocedure Evaluation (Signed)
Anesthesia Post Note  Patient: Peter [name]  Procedure(s) Performed: Procedure(s) (LRB): ARTERIOVENOUS (AV) FISTULA CREATION LEFT ARM (Left)  Patient location during evaluation: PACU Anesthesia Type: MAC Level of consciousness: awake and alert Pain management: pain level controlled Vital Signs Assessment: post-procedure vital signs reviewed and stable Respiratory status: spontaneous breathing, nonlabored ventilation, respiratory function stable and patient connected to nasal cannula oxygen Cardiovascular status: stable and blood pressure returned to baseline Anesthetic complications: no    Last Vitals:  Vitals:   06/14/16 1002 06/14/16 1006  BP: (!) 127/56 (!) 128/52  Pulse: (!) 53 (!) 55  Resp: 14 16  Temp: 36.6 C     Last Pain:  Vitals:   06/14/16 1006  TempSrc:   PainSc: 0-No pain                 Effie Berkshire

## 2016-06-14 NOTE — Interval H&P Note (Signed)
History and Physical Interval Note:  06/14/2016 6:54 AM  Peter [name]  has presented today for surgery, with the diagnosis of Chronic Kidney disease  The various methods of treatment have been discussed with the patient and family. After consideration of risks, benefits and other options for treatment, the patient has consented to  Procedure(s): ARTERIOVENOUS (AV) FISTULA CREATION LEFT ARM (Left) as a surgical intervention .  The patient's history has been reviewed, patient examined, no change in status, stable for surgery.  I have reviewed the patient's chart and labs.  Questions were answered to the patient's satisfaction.     Deitra Mayo

## 2016-06-14 NOTE — Transfer of Care (Signed)
Immediate Anesthesia Transfer of Care Note  Patient: Peter [name]  Procedure(s) Performed: Procedure(s): ARTERIOVENOUS (AV) FISTULA CREATION LEFT ARM (Left)  Patient Location: PACU  Anesthesia Type:MAC  Level of Consciousness: awake, alert  and oriented  Airway & Oxygen Therapy: Patient Spontanous Breathing  Post-op Assessment: Report given to RN, Post -op Vital signs reviewed and stable and Patient moving all extremities X 4  Post vital signs: Reviewed and stable  Last Vitals:  Vitals:   06/14/16 0641 06/14/16 0930  BP: (!) 186/83 (!) 115/46  Pulse: (!) 54 61  Resp: 18 15  Temp: 36.8 C 36.6 C    Last Pain:  Vitals:   06/14/16 0641  TempSrc: Oral         Complications: No apparent anesthesia complications

## 2016-06-14 NOTE — H&P (View-Only) (Signed)
Patient name: Peter [name] MRN: [id number] DOB: [birth year] Sex: male  REASON FOR CONSULT: Evaluate for AV fistula. Referred by Dr. Jamal Maes.  HPI: Peter [name] is a 65 y.o. male, who was referred for evaluation for an AV fistula. The patient denies any recent uremic symptoms. Specifically, he denies any history of, nausea, vomiting, fatigue, or anorexia. He is right-handed.  I have reviewed the records that were sent with the patient. In May 2017 GFR was 17. The patient has stage IV chronic kidney disease. The patient has a history of diabetes, hypertension, and a partial left nephrectomy in 2010 for a renal oncocytoma. Of note, if he is not a candidate for a fistula that we've been asked to wait before placing an AV graft.  Past Medical History:  Diagnosis Date  . CKD (chronic kidney disease), stage III   . Diabetes mellitus without complication ([place]) 16/0737   type II  . ED (erectile dysfunction)   . Hypercholesteremia   . Hypertension   . Kidney stone 2010  . 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 OSA (obstructive sleep apnea)    AHI 38/hr on CPAP at 12cm h2o  . 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    Family History  Problem Relation Age of Onset  . Dementia Mother   . Hypertension Father     SOCIAL HISTORY: Social History   Social History  . Marital status: Divorced    Spouse name: N/A  . Number of children: N/A  . Years of education: N/A   Occupational History  . Not on file.   Social History Main Topics  . Smoking status: Never Smoker  . Smokeless tobacco: Not on file  . Alcohol use No  . Drug use: No  . Sexual activity: Not on file   Other Topics Concern  . Not on file   Social History Narrative  . No narrative on file    Allergies  Allergen Reactions  . Penicillins     Childhood allergy     Current Outpatient Prescriptions  Medication Sig Dispense Refill  . amLODipine (NORVASC) 10 MG  tablet Take 10 mg by mouth daily.    Marland Kitchen aspirin 81 MG tablet Take 81 mg by mouth daily.    Marland Kitchen atenolol (TENORMIN) 50 MG tablet Take 100 mg by mouth daily.     Marland Kitchen atorvastatin (LIPITOR) 10 MG tablet Take 10 mg by mouth daily.    . calcitRIOL (ROCALTROL) 0.25 MCG capsule Take 0.25 mcg by mouth daily.    . furosemide (LASIX) 40 MG tablet Take 40 mg by mouth.    Marland Kitchen glimepiride (AMARYL) 1 MG tablet Take 1 mg by mouth daily with breakfast.    . lisinopril (PRINIVIL,ZESTRIL) 20 MG tablet Take 20 mg by mouth daily.    . potassium chloride SA (K-DUR,KLOR-CON) 20 MEQ tablet Take 40 mEq by mouth 2 (two) times daily. [place] kidney changed on 06/07/14    . sildenafil (VIAGRA) 100 MG tablet Take 100 mg by mouth daily as needed for erectile dysfunction.     No current facility-administered medications for this visit.     REVIEW OF SYSTEMS:  [X]  denotes positive finding, [ ]  denotes negative finding Cardiac  Comments:  Chest pain or chest pressure:    Shortness of breath upon exertion:    Short of breath when lying flat:    Irregular heart rhythm:  Vascular    Pain in calf, thigh, or hip brought on by ambulation:    Pain in feet at night that wakes you up from your sleep:     Blood clot in your veins:    Leg swelling:         Pulmonary    Oxygen at home:    Productive cough:     Wheezing:         Neurologic    Sudden weakness in arms or legs:     Sudden numbness in arms or legs:     Sudden onset of difficulty speaking or slurred speech:    Temporary loss of vision in one eye:     Problems with dizziness:         Gastrointestinal    Blood in stool:     Vomited blood:         Genitourinary    Burning when urinating:     Blood in urine:        Psychiatric    Major depression:         Hematologic    Bleeding problems:    Problems with blood clotting too easily:        Skin    Rashes or ulcers:        Constitutional    Fever or chills:      PHYSICAL EXAM: Vitals:    05/22/16 1120 05/22/16 1124  BP: (!) 169/90 (!) 173/96  Pulse: (!) 47   Resp: 16   Temp: 98 F (36.7 C)   TempSrc: Oral   SpO2: 99%   Weight: 267 lb (121.1 kg)   Height: 5\' 8"  (1.727 m)     GENERAL: The patient is a well-nourished male, in no acute distress. The vital signs are documented above. CARDIAC: There is a regular rate and rhythm.  VASCULAR: I do not detect carotid bruits. Has palpable radial pulses bilaterally. PULMONARY: There is good air exchange bilaterally without wheezing or rales. ABDOMEN: Soft and non-tender with normal pitched bowel sounds.  MUSCULOSKELETAL: There are no major deformities or cyanosis. NEUROLOGIC: No focal weakness or paresthesias are detected. SKIN: There are no ulcers or rashes noted. PSYCHIATRIC: The patient has a normal affect.  DATA:   UPPER EXTREMITY VEIN MAP: I have independently interpreted her upper extremity vein map.  On the left side, the upper arm cephalic vein, and potentially the forearm cephalic vein could be adequate for fistula. The forearm cephalic vein is somewhat small.  On the right side the upper arm cephalic vein appears to be adequate for a fistula.  UPPER EXTREMITY ARTERIAL DUPLEX: I have independently interpreted her upper extremity arterial duplex can. The patient has triphasic Doppler signals in the radial and ulnar positions bilaterally.  MEDICAL ISSUES:  STAGE IV CHRONIC KIDNEY DISEASE:  He appears to be a good candidate for a left brachiocephalic AV fistula or possibly a radial cephalic AV fistula. Of note, if he is not a candidate for a fistula that we've been asked to wait before placing an AV graft. I have discussed the indications for the procedure and the potential complications. All of his questions were answered and he is agreeable to proceed. He's having to work around his wife's work schedule and the earliest he can schedule this is 06/11/2016.  HYPERTENSION: The patient's initial blood pressure today was  elevated. We repeated this and this was still elevated. We have encouraged the patient to follow up with their primary care physician  for management of their blood pressure.   Deitra Mayo [facility] [place] [phone]

## 2016-06-17 ENCOUNTER — Encounter (HOSPITAL_COMMUNITY): Payer: Self-pay | Admitting: Vascular Surgery

## 2016-06-25 ENCOUNTER — Telehealth: Payer: Self-pay | Admitting: Vascular Surgery

## 2016-06-25 NOTE — Telephone Encounter (Signed)
OV on 11/1

## 2016-06-25 NOTE — Telephone Encounter (Signed)
-----   Message from Mena Goes, RN sent at 06/14/2016  9:37 AM EDT ----- Regarding: schedule   ----- Message ----- From: Angelia Mould, MD Sent: 06/14/2016   9:16 AM To: [facility] Charge Pool Subject: charge                                         PROCEDURE:  1. Left brachiocephalic AV fistula 2. Superficialization of  Left AVF  SURGEON: Judeth Cornfield. Scot Dock, MD, FACS  ASSIST: Izetta Dakin RNFA  He will need a follow up visit in 6 weeks with a duplex of his fistula at that time. Thank you. CD

## 2016-06-25 NOTE — Telephone Encounter (Signed)
Letter mailed to home address for f/u appt with Dr Scot Dock on

## 2016-07-01 ENCOUNTER — Ambulatory Visit: Payer: Medicare Other | Admitting: Cardiology

## 2016-07-25 ENCOUNTER — Encounter: Payer: Self-pay | Admitting: Vascular Surgery

## 2016-07-26 ENCOUNTER — Ambulatory Visit: Payer: Medicare Other | Admitting: Cardiology

## 2016-07-31 ENCOUNTER — Encounter: Payer: Self-pay | Admitting: Vascular Surgery

## 2016-07-31 ENCOUNTER — Ambulatory Visit (INDEPENDENT_AMBULATORY_CARE_PROVIDER_SITE_OTHER): Payer: Medicare Other | Admitting: Vascular Surgery

## 2016-07-31 ENCOUNTER — Ambulatory Visit (HOSPITAL_COMMUNITY)
Admission: RE | Admit: 2016-07-31 | Discharge: 2016-07-31 | Disposition: A | Discharge status: Home / Self Care | Payer: Medicare Other | Source: Ambulatory Visit | Attending: Vascular Surgery | Admitting: Vascular Surgery

## 2016-07-31 VITALS — BP 162/78 | HR 72 | Temp 97.8°F | Resp 24 | Ht 67.5 in | Wt 270.0 lb

## 2016-07-31 DIAGNOSIS — Z4931 Encounter for adequacy testing for hemodialysis: Secondary | ICD-10-CM | POA: Insufficient documentation

## 2016-07-31 DIAGNOSIS — N186 End stage renal disease: Secondary | ICD-10-CM | POA: Diagnosis not present

## 2016-07-31 DIAGNOSIS — Z48812 Encounter for surgical aftercare following surgery on the circulatory system: Secondary | ICD-10-CM

## 2016-07-31 NOTE — Progress Notes (Signed)
   Patient name: Peter [name] MRN: [id number] DOB: 1950/10/30 Sex: male  REASON FOR VISIT: Follow up after left brachiocephalic AV fistula.  HPI: Peter [name] is a 65 y.o. male who I performed a left brachiocephalic fistula on 59/16/3846. I also superficialized it. He is not yet on dialysis. He denies pain or paresthesias in his left arm. He has no specific complaints.  Current Outpatient Prescriptions  Medication Sig Dispense Refill  . amLODipine (NORVASC) 10 MG tablet Take 10 mg by mouth daily.    Marland Kitchen aspirin 81 MG tablet Take 81 mg by mouth daily.    Marland Kitchen atenolol (TENORMIN) 100 MG tablet Take 100 mg by mouth daily.    Marland Kitchen atorvastatin (LIPITOR) 20 MG tablet Take 20 mg by mouth daily.    . calcitRIOL (ROCALTROL) 0.25 MCG capsule Take 0.25 mcg by mouth every Monday, Wednesday, and Friday.     . furosemide (LASIX) 40 MG tablet Take 40 mg by mouth daily.     Marland Kitchen glimepiride (AMARYL) 1 MG tablet Take 1 mg by mouth daily with breakfast.    . hydrALAZINE (APRESOLINE) 50 MG tablet Take 100 mg by mouth 2 (two) times daily.    Marland Kitchen lisinopril (PRINIVIL,ZESTRIL) 20 MG tablet Take 20 mg by mouth daily.    . potassium chloride SA (K-DUR,KLOR-CON) 20 MEQ tablet Take 40 mEq by mouth 2 (two) times daily. [place] kidney changed on 06/07/14    . sildenafil (VIAGRA) 100 MG tablet Take 100 mg by mouth daily as needed for erectile dysfunction.     No current facility-administered medications for this visit.     REVIEW OF SYSTEMS:  [X]  denotes positive finding, [ ]  denotes negative finding Cardiac  Comments:  Chest pain or chest pressure:    Shortness of breath upon exertion:    Short of breath when lying flat:    Irregular heart rhythm:    Constitutional    Fever or chills:      PHYSICAL EXAM: Vitals:   07/31/16 1358 07/31/16 1402  BP: (!) 159/78 (!) 162/78  Pulse: 68 72  Resp: (!) 24   Temp: 97.8 F (36.6 C)   TempSrc: Oral   SpO2: 96%   Weight: 270 lb (122.5 kg)   Height: 5' 7.5" (1.715 m)      GENERAL: The patient is a well-nourished male, in no acute distress. The vital signs are documented above. CARDIOVASCULAR: There is a regular rate and rhythm. PULMONARY: There is good air exchange bilaterally without wheezing or rales. He has an excellent thrill in his left upper arm fistula. He has a palpable left radial pulse.  DUPLEX OF AV FISTULA: I have inability to the duplex of the left brachiocephalic AV fistula. The diameters range from 0.6-0.71 cm. Depths appear reasonable.  MEDICAL ISSUES:  STATUS POST LEFT BRACHIOCEPHALIC AV FISTULA: The patient is doing well status post placement of a left brachiocephalic AV fistula. This appears to be maturing adequately and should be ready for the dialysis if and when it is needed. I will see him back as needed.  Peter [name] [facility] [place] [phone]

## 2016-09-04 ENCOUNTER — Encounter: Payer: Self-pay | Admitting: Cardiology

## 2016-09-09 ENCOUNTER — Ambulatory Visit (INDEPENDENT_AMBULATORY_CARE_PROVIDER_SITE_OTHER): Payer: Medicare Other | Admitting: Cardiology

## 2016-09-09 ENCOUNTER — Encounter (INDEPENDENT_AMBULATORY_CARE_PROVIDER_SITE_OTHER): Payer: Self-pay

## 2016-09-09 VITALS — BP 154/70 | HR 68 | Ht 67.5 in | Wt 260.0 lb

## 2016-09-09 DIAGNOSIS — I341 Nonrheumatic mitral (valve) prolapse: Secondary | ICD-10-CM | POA: Diagnosis not present

## 2016-09-09 DIAGNOSIS — I10 Essential (primary) hypertension: Secondary | ICD-10-CM | POA: Diagnosis not present

## 2016-09-09 DIAGNOSIS — G4733 Obstructive sleep apnea (adult) (pediatric): Secondary | ICD-10-CM

## 2016-09-09 NOTE — Progress Notes (Signed)
Cardiology Office Note    Date:  09/09/2016   ID:  Peter [name], DOB 08/20/1951, MRN [id number]  PCP:  Simona Huh, MD  Cardiologist:  Fransico Him, MD   Chief Complaint  Patient presents with  . Hypertension  . Sleep Apnea    History of Present Illness:  Peter [name] is a 65 y.o. male with a history of OSA on CPAP, HTN, MVP with mild MR, diastolic dysfunction and morbid obesity. He is doing well with his CPAP therapy. He tolerates his device well. He tolerates his full face mask. He feels the pressure is adequate. He has no nasal congestion or nasal/mouth dryness. He feels rested when he gets up if he goes to bed on time and has no daytime sleepiness. He denies any chest pain, SOB, DOE, dizziness, palpitations or syncope. He has chronic LE edema which is stable. He walks for exercise.     Past Medical History:  Diagnosis Date  . CKD (chronic kidney disease), stage III   . Diabetes mellitus without complication ([place]) 44/0347   type II  . ED (erectile dysfunction)   . Hypercholesteremia   . Hypertension   . Kidney stone 2010  . 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 OSA (obstructive sleep apnea)    AHI 38/hr on CPAP at 12cm h2o  . 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    Past Surgical History:  Procedure Laterality Date  . AV FISTULA PLACEMENT Left 06/14/2016   Procedure: ARTERIOVENOUS (AV) FISTULA CREATION LEFT ARM;  Surgeon: Angelia Mould, MD;  Location: [facility];  Service: Vascular;  Laterality: Left;  . COLONOSCOPY    . JOINT REPLACEMENT Left 2010   hip  . PARTIAL NEPHRECTOMY Left 2010  . TONSILLECTOMY      Current Medications: Outpatient Medications Prior to Visit  Medication Sig Dispense Refill  . amLODipine (NORVASC) 10 MG tablet Take 10 mg by mouth daily.    Marland Kitchen aspirin 81 MG tablet Take 81 mg by mouth daily.    Marland Kitchen atenolol (TENORMIN) 100 MG tablet Take 100 mg by mouth daily.    Marland Kitchen atorvastatin  (LIPITOR) 20 MG tablet Take 20 mg by mouth daily.    . calcitRIOL (ROCALTROL) 0.25 MCG capsule Take 0.25 mcg by mouth every Monday, Wednesday, and Friday.     . furosemide (LASIX) 40 MG tablet Take 40 mg by mouth daily.     Marland Kitchen glimepiride (AMARYL) 1 MG tablet Take 1 mg by mouth daily with breakfast.    . hydrALAZINE (APRESOLINE) 100 MG tablet Take 100 mg by mouth 2 (two) times daily.    . potassium chloride SA (K-DUR,KLOR-CON) 20 MEQ tablet Take 40 mEq by mouth 2 (two) times daily. [place] kidney changed on 06/07/14    . sildenafil (VIAGRA) 100 MG tablet Take 100 mg by mouth daily as needed for erectile dysfunction.    Marland Kitchen lisinopril (PRINIVIL,ZESTRIL) 20 MG tablet Take 20 mg by mouth daily.     No facility-administered medications prior to visit.      Allergies:   Penicillins   Social History   Social History  . Marital status: Divorced    Spouse name: N/A  . Number of children: N/A  . Years of education: N/A   Social History Main Topics  . Smoking status: Former Smoker    Types: Cigarettes    Quit date: 06/13/1986  . Smokeless tobacco: Former Systems developer  Types: Chew  . Alcohol use No  . Drug use: No  . Sexual activity: Not on file   Other Topics Concern  . Not on file   Social History Narrative  . No narrative on file     Family History:  The patient's family history includes Dementia in his mother; Hypertension in his father.   ROS:   Please see the history of present illness.    ROS All other systems reviewed and are negative.  No flowsheet data found.     PHYSICAL EXAM:   VS:  BP (!) 154/70   Pulse 68   Ht 5' 7.5" (1.715 m)   Wt 260 lb (117.9 kg)   BMI 40.12 kg/m    GEN: Well nourished, well developed, in no acute distress  HEENT: normal  Neck: no JVD, carotid bruits, or masses Cardiac: RRR; no murmurs, rubs, or gallops,no edema.  Intact distal pulses bilaterally.  Respiratory:  clear to auscultation bilaterally, normal work of breathing GI: soft, nontender,  nondistended, + BS MS: no deformity or atrophy  Skin: warm and dry, no rash Neuro:  Alert and Oriented x 3, Strength and sensation are intact Psych: euthymic mood, full affect  Wt Readings from Last 3 Encounters:  09/09/16 260 lb (117.9 kg)  07/31/16 270 lb (122.5 kg)  06/14/16 260 lb (117.9 kg)      Studies/Labs Reviewed:   EKG:  EKG is not ordered today.    Recent Labs: 06/14/2016: Hemoglobin 10.9; Potassium 3.2; Sodium 141   Lipid Panel No results found for: CHOL, TRIG, HDL, CHOLHDL, VLDL, LDLCALC, LDLDIRECT  Additional studies/ records that were reviewed today include:  CPAP download    ASSESSMENT:    1. MVP (mitral valve prolapse)   2. Essential hypertension, benign   3. Obstructive sleep apnea   4. Morbid obesity ([place])      PLAN:  In order of problems listed above:  1. 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   OSA - the patient is tolerating PAP therapy well without any problems. The PAP download was reviewed today and showed an AHI of 2/hr on 12 cm H2O with 70% compliance in using more than 4 hours nightly.  The patient has been using and benefiting from CPAP use and will continue to benefit from therapy.  HTN - BP borderline controlled on current meds.  He says his BP has been running high at home as well.  His PCP is following his BP.  He will continue Atenolol/ Hydralazine and amlodipine and followup with Neprhology next week after BP med adjustment. 4.   Morbid obesity - I have encouraged him to get into a routine exercise program and cut back on carbs and portions.     Medication Adjustments/Labs and Tests Ordered: Current medicines are reviewed at length with the patient today.  Concerns regarding medicines are outlined above.  Medication changes, Labs and Tests ordered today are listed in the Patient Instructions below.  There are no Patient Instructions on file for this visit.   Signed, Fransico Him, MD  09/09/2016 3:18 PM    [facility] [place], [place], [place]  [address] Phone: [phone]; Fax: [phone]

## 2016-09-09 NOTE — Patient Instructions (Signed)

## 2017-02-05 ENCOUNTER — Other Ambulatory Visit (HOSPITAL_COMMUNITY): Payer: Self-pay | Admitting: *Deleted

## 2017-02-05 NOTE — Discharge Instructions (Signed)

## 2017-02-06 ENCOUNTER — Ambulatory Visit (HOSPITAL_COMMUNITY)
Admission: RE | Admit: 2017-02-06 | Discharge: 2017-02-06 | Disposition: A | Discharge status: Home / Self Care | Payer: Medicare Other | Source: Ambulatory Visit | Attending: Nephrology | Admitting: Nephrology

## 2017-02-06 DIAGNOSIS — N185 Chronic kidney disease, stage 5: Secondary | ICD-10-CM | POA: Diagnosis present

## 2017-02-06 LAB — POCT HEMOGLOBIN-HEMACUE: Hemoglobin: 10 g/dL — ABNORMAL LOW (ref 13.0–17.0)

## 2017-02-06 MED ORDER — DARBEPOETIN ALFA 100 MCG/0.5ML IJ SOSY
PREFILLED_SYRINGE | INTRAMUSCULAR | Status: AC
  Administered 2017-02-06: 100 ug via SUBCUTANEOUS
  Filled 2017-02-06: qty 0.5

## 2017-02-06 MED ORDER — DARBEPOETIN ALFA 100 MCG/0.5ML IJ SOSY
100.0000 ug | PREFILLED_SYRINGE | INTRAMUSCULAR | Status: DC
  Administered 2017-02-06: 100 ug via SUBCUTANEOUS

## 2017-02-21 ENCOUNTER — Encounter: Payer: Self-pay | Admitting: Nephrology

## 2017-03-06 ENCOUNTER — Encounter (HOSPITAL_COMMUNITY)
Admission: RE | Admit: 2017-03-06 | Discharge: 2017-03-06 | Disposition: A | Discharge status: Home / Self Care | Payer: Medicare Other | Source: Ambulatory Visit | Attending: Nephrology | Admitting: Nephrology

## 2017-03-06 DIAGNOSIS — N185 Chronic kidney disease, stage 5: Secondary | ICD-10-CM | POA: Insufficient documentation

## 2017-03-06 LAB — POCT HEMOGLOBIN-HEMACUE: Hemoglobin: 9.5 g/dL — ABNORMAL LOW (ref 13.0–17.0)

## 2017-03-06 LAB — IRON AND TIBC
Iron: 46 ug/dL (ref 45–182)
Saturation Ratios: 20 % (ref 17.9–39.5)
TIBC: 232 ug/dL — ABNORMAL LOW (ref 250–450)
UIBC: 186 ug/dL

## 2017-03-06 LAB — FERRITIN: Ferritin: 126 ng/mL (ref 24–336)

## 2017-03-06 MED ORDER — DARBEPOETIN ALFA 100 MCG/0.5ML IJ SOSY
100.0000 ug | PREFILLED_SYRINGE | INTRAMUSCULAR | Status: DC
  Administered 2017-03-06: 100 ug via SUBCUTANEOUS

## 2017-03-06 MED ORDER — DARBEPOETIN ALFA 100 MCG/0.5ML IJ SOSY
PREFILLED_SYRINGE | INTRAMUSCULAR | Status: AC
  Filled 2017-03-06: qty 0.5

## 2017-03-21 ENCOUNTER — Other Ambulatory Visit (HOSPITAL_COMMUNITY): Payer: Self-pay | Admitting: *Deleted

## 2017-03-24 ENCOUNTER — Encounter (HOSPITAL_COMMUNITY)
Admission: RE | Admit: 2017-03-24 | Discharge: 2017-03-24 | Disposition: A | Discharge status: Home / Self Care | Payer: Medicare Other | Source: Ambulatory Visit | Attending: Nephrology | Admitting: Nephrology

## 2017-03-24 DIAGNOSIS — N185 Chronic kidney disease, stage 5: Secondary | ICD-10-CM | POA: Diagnosis not present

## 2017-03-24 MED ORDER — DARBEPOETIN ALFA 100 MCG/0.5ML IJ SOSY
PREFILLED_SYRINGE | INTRAMUSCULAR | Status: AC
  Filled 2017-03-24: qty 0.5

## 2017-03-24 MED ORDER — DARBEPOETIN ALFA 100 MCG/0.5ML IJ SOSY
100.0000 ug | PREFILLED_SYRINGE | Freq: Once | INTRAMUSCULAR | Status: AC
  Administered 2017-03-24: 100 ug via SUBCUTANEOUS

## 2017-03-25 LAB — POCT HEMOGLOBIN-HEMACUE: Hemoglobin: 11.7 g/dL — ABNORMAL LOW (ref 13.0–17.0)

## 2017-04-03 ENCOUNTER — Encounter (HOSPITAL_COMMUNITY): Payer: Medicare Other

## 2017-04-04 ENCOUNTER — Encounter (HOSPITAL_COMMUNITY): Payer: Medicare Other

## 2017-04-18 ENCOUNTER — Other Ambulatory Visit (HOSPITAL_COMMUNITY): Payer: Self-pay | Admitting: *Deleted

## 2017-04-21 ENCOUNTER — Encounter (HOSPITAL_COMMUNITY)
Admission: RE | Admit: 2017-04-21 | Discharge: 2017-04-21 | Disposition: A | Discharge status: Home / Self Care | Payer: Medicare Other | Source: Ambulatory Visit | Attending: Nephrology | Admitting: Nephrology

## 2017-04-21 DIAGNOSIS — N185 Chronic kidney disease, stage 5: Secondary | ICD-10-CM | POA: Diagnosis present

## 2017-04-21 LAB — FERRITIN: Ferritin: 97 ng/mL (ref 24–336)

## 2017-04-21 LAB — IRON AND TIBC
Iron: 36 ug/dL — ABNORMAL LOW (ref 45–182)
Saturation Ratios: 16 % — ABNORMAL LOW (ref 17.9–39.5)
TIBC: 231 ug/dL — ABNORMAL LOW (ref 250–450)
UIBC: 195 ug/dL

## 2017-04-21 LAB — POCT HEMOGLOBIN-HEMACUE: Hemoglobin: 11.4 g/dL — ABNORMAL LOW (ref 13.0–17.0)

## 2017-04-21 MED ORDER — DARBEPOETIN ALFA 100 MCG/0.5ML IJ SOSY
200.0000 ug | PREFILLED_SYRINGE | INTRAMUSCULAR | Status: DC
  Administered 2017-04-21: 200 ug via SUBCUTANEOUS

## 2017-04-21 MED ORDER — DARBEPOETIN ALFA 200 MCG/0.4ML IJ SOSY
PREFILLED_SYRINGE | INTRAMUSCULAR | Status: AC
  Filled 2017-04-21: qty 0.4

## 2017-04-21 MED ORDER — DARBEPOETIN ALFA 100 MCG/0.5ML IJ SOSY: 100.0000 ug | PREFILLED_SYRINGE | INTRAMUSCULAR | Status: DC

## 2017-04-22 MED FILL — Darbepoetin Alfa Soln Prefilled Syringe 200 MCG/0.4ML: INTRAMUSCULAR | Qty: 0.4 | Status: AC

## 2017-05-19 ENCOUNTER — Encounter (HOSPITAL_COMMUNITY)
Admission: RE | Admit: 2017-05-19 | Discharge: 2017-05-19 | Disposition: A | Discharge status: Home / Self Care | Payer: Medicare Other | Source: Ambulatory Visit | Attending: Nephrology | Admitting: Nephrology

## 2017-05-19 DIAGNOSIS — N185 Chronic kidney disease, stage 5: Secondary | ICD-10-CM | POA: Diagnosis not present

## 2017-05-19 LAB — IRON AND TIBC
Iron: 56 ug/dL (ref 45–182)
Saturation Ratios: 21 % (ref 17.9–39.5)
TIBC: 265 ug/dL (ref 250–450)
UIBC: 209 ug/dL

## 2017-05-19 LAB — POCT HEMOGLOBIN-HEMACUE: Hemoglobin: 12.7 g/dL — ABNORMAL LOW (ref 13.0–17.0)

## 2017-05-19 LAB — FERRITIN: Ferritin: 73 ng/mL (ref 24–336)

## 2017-05-19 MED ORDER — DARBEPOETIN ALFA 200 MCG/0.4ML IJ SOSY: 200.0000 ug | PREFILLED_SYRINGE | INTRAMUSCULAR | Status: DC

## 2017-05-30 ENCOUNTER — Other Ambulatory Visit (HOSPITAL_COMMUNITY): Payer: Self-pay | Admitting: *Deleted

## 2017-06-03 ENCOUNTER — Encounter (HOSPITAL_COMMUNITY)
Admission: RE | Admit: 2017-06-03 | Discharge: 2017-06-03 | Disposition: A | Discharge status: Home / Self Care | Payer: Medicare Other | Source: Ambulatory Visit | Attending: Nephrology | Admitting: Nephrology

## 2017-06-03 DIAGNOSIS — N185 Chronic kidney disease, stage 5: Secondary | ICD-10-CM | POA: Diagnosis present

## 2017-06-03 LAB — POCT HEMOGLOBIN-HEMACUE: Hemoglobin: 12.2 g/dL — ABNORMAL LOW (ref 13.0–17.0)

## 2017-06-03 MED ORDER — DARBEPOETIN ALFA 200 MCG/0.4ML IJ SOSY: 200.0000 ug | PREFILLED_SYRINGE | INTRAMUSCULAR | Status: DC

## 2017-06-16 ENCOUNTER — Other Ambulatory Visit (HOSPITAL_COMMUNITY): Payer: Self-pay | Admitting: *Deleted

## 2017-06-17 ENCOUNTER — Encounter (HOSPITAL_COMMUNITY)
Admission: RE | Admit: 2017-06-17 | Discharge: 2017-06-17 | Disposition: A | Discharge status: Home / Self Care | Payer: Medicare Other | Source: Ambulatory Visit | Attending: Nephrology | Admitting: Nephrology

## 2017-06-17 DIAGNOSIS — N185 Chronic kidney disease, stage 5: Secondary | ICD-10-CM | POA: Diagnosis not present

## 2017-06-17 LAB — IRON AND TIBC
Iron: 84 ug/dL (ref 45–182)
Saturation Ratios: 33 % (ref 17.9–39.5)
TIBC: 252 ug/dL (ref 250–450)
UIBC: 168 ug/dL

## 2017-06-17 LAB — POCT HEMOGLOBIN-HEMACUE: Hemoglobin: 13 g/dL (ref 13.0–17.0)

## 2017-06-17 LAB — FERRITIN: Ferritin: 96 ng/mL (ref 24–336)

## 2017-06-17 MED ORDER — DARBEPOETIN ALFA 100 MCG/0.5ML IJ SOSY: 100.0000 ug | PREFILLED_SYRINGE | Freq: Once | INTRAMUSCULAR | Status: DC

## 2017-06-30 ENCOUNTER — Other Ambulatory Visit (HOSPITAL_COMMUNITY): Payer: Self-pay | Admitting: *Deleted

## 2017-07-01 ENCOUNTER — Encounter (HOSPITAL_COMMUNITY)
Admission: RE | Admit: 2017-07-01 | Discharge: 2017-07-01 | Disposition: A | Discharge status: Home / Self Care | Payer: Medicare Other | Source: Ambulatory Visit | Attending: Nephrology | Admitting: Nephrology

## 2017-07-01 DIAGNOSIS — N185 Chronic kidney disease, stage 5: Secondary | ICD-10-CM | POA: Insufficient documentation

## 2017-07-01 LAB — POCT HEMOGLOBIN-HEMACUE: Hemoglobin: 13.3 g/dL (ref 13.0–17.0)

## 2017-07-01 MED ORDER — DARBEPOETIN ALFA 100 MCG/0.5ML IJ SOSY: 100.0000 ug | PREFILLED_SYRINGE | Freq: Once | INTRAMUSCULAR | Status: DC

## 2017-07-15 ENCOUNTER — Encounter (HOSPITAL_COMMUNITY): Payer: Medicare Other

## 2018-01-09 ENCOUNTER — Ambulatory Visit (HOSPITAL_COMMUNITY)
Admission: RE | Admit: 2018-01-09 | Discharge: 2018-01-09 | Disposition: A | Discharge status: Home / Self Care | Payer: Medicare Other | Source: Ambulatory Visit | Attending: Nephrology | Admitting: Nephrology

## 2018-01-09 ENCOUNTER — Other Ambulatory Visit: Payer: Self-pay

## 2018-01-09 ENCOUNTER — Other Ambulatory Visit (HOSPITAL_COMMUNITY): Payer: Self-pay | Admitting: Nephrology

## 2018-01-09 DIAGNOSIS — Z4931 Encounter for adequacy testing for hemodialysis: Secondary | ICD-10-CM

## 2018-01-09 DIAGNOSIS — M7989 Other specified soft tissue disorders: Secondary | ICD-10-CM | POA: Insufficient documentation

## 2018-01-09 DIAGNOSIS — R609 Edema, unspecified: Secondary | ICD-10-CM | POA: Diagnosis not present

## 2018-01-09 NOTE — Progress Notes (Signed)
Left lower extremity venous duplex has been completed. Negative for obvious evidence of DVT. Results were given to [facility] at Dr. Sanda Klein office.  01/09/18 3:18 PM Peter [name] RVT

## 2018-01-12 ENCOUNTER — Encounter (HOSPITAL_COMMUNITY): Payer: Medicare Other

## 2018-01-14 ENCOUNTER — Ambulatory Visit (HOSPITAL_COMMUNITY)
Admission: RE | Admit: 2018-01-14 | Discharge: 2018-01-14 | Disposition: A | Discharge status: Home / Self Care | Payer: Medicare Other | Source: Ambulatory Visit | Attending: Vascular Surgery | Admitting: Vascular Surgery

## 2018-01-14 DIAGNOSIS — Z4931 Encounter for adequacy testing for hemodialysis: Secondary | ICD-10-CM | POA: Insufficient documentation

## 2018-01-15 ENCOUNTER — Encounter: Payer: Self-pay | Admitting: Surgery

## 2018-01-15 ENCOUNTER — Ambulatory Visit: Payer: Medicare Other | Admitting: Surgery

## 2018-01-15 ENCOUNTER — Other Ambulatory Visit: Payer: Self-pay

## 2018-01-15 ENCOUNTER — Other Ambulatory Visit: Payer: Self-pay | Admitting: *Deleted

## 2018-01-15 ENCOUNTER — Encounter: Payer: Self-pay | Admitting: *Deleted

## 2018-01-15 VITALS — BP 161/69 | HR 52 | Resp 20 | Ht 67.5 in | Wt 261.0 lb

## 2018-01-15 DIAGNOSIS — N184 Chronic kidney disease, stage 4 (severe): Secondary | ICD-10-CM | POA: Diagnosis not present

## 2018-01-15 NOTE — Progress Notes (Signed)
[facility] [place]  Patient name: Peter [name] MRN: [id number] DOB: 08/27/1951 Sex: male   REASON FOR VISIT:    Follow up  HISOTRY OF PRESENT ILLNESS:    Peter [name] is a 67 y.o. male who is s/p left upper arm AVF by CSD in 2017.  It wa also elevated.  He is not yet on HD.  There has been a change in his fistula thrill recently and he was sent for evaluation   PAST MEDICAL HISTORY:   Past Medical History:  Diagnosis Date  . Anemia in chronic kidney disease (CKD)   . CKD (chronic kidney disease) stage 4, GFR 15-29 ml/min ([facility])   . Diabetes mellitus without complication ([place]) 00/1749   type II  . ED (erectile dysfunction)   . Hypercholesteremia   . Hypertension   . Kidney stone 2010  . 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 OSA (obstructive sleep apnea)    AHI 38/hr on CPAP at 12cm h2o  . 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     FAMILY HISTORY:   Family History  Problem Relation Age of Onset  . Dementia Mother   . Hypertension Father     SOCIAL HISTORY:   Social History   Tobacco Use  . Smoking status: Former Smoker    Types: Cigarettes    Last attempt to quit: 06/13/1986    Years since quitting: 31.6  . Smokeless tobacco: Former Systems developer    Types: Chew  Substance Use Topics  . Alcohol use: No     ALLERGIES:   Allergies  Allergen Reactions  . Penicillins Other (See Comments)    UNSPECIFIED REACTION Childhood allergy Has patient had a PCN reaction causing immediate rash, facial/tongue/throat swelling, SOB or lightheadedness with hypotension: Unknown Has patient had a PCN reaction causing severe rash involving mucus membranes or skin necrosis: No Has patient had a PCN reaction that required hospitalization No Has patient had a PCN reaction occurring within the last 10 years: No If all of the above answers are "NO", then may proceed with  Cephalosporin use.       CURRENT MEDICATIONS:   Current Outpatient Medications  Medication Sig Dispense Refill  . amLODipine (NORVASC) 10 MG tablet Take 10 mg by mouth daily.    Marland Kitchen aspirin 81 MG tablet Take 81 mg by mouth daily.    Marland Kitchen atenolol (TENORMIN) 100 MG tablet Take 100 mg by mouth daily.    Marland Kitchen atorvastatin (LIPITOR) 20 MG tablet Take 20 mg by mouth daily.    . calcitRIOL (ROCALTROL) 0.25 MCG capsule Take 0.25 mcg by mouth every Monday, Wednesday, and Friday.     . doxazosin (CARDURA) 2 MG tablet Take 2 mg by mouth at bedtime.  5  . furosemide (LASIX) 40 MG tablet Take 40 mg by mouth daily.     . hydrALAZINE (APRESOLINE) 100 MG tablet Take 100 mg by mouth 2 (two) times daily.    . potassium chloride SA (K-DUR,KLOR-CON) 20 MEQ tablet Take 40 mEq by mouth 2 (two) times daily. [place] kidney changed on 06/07/14    . glimepiride (AMARYL) 1 MG tablet Take 1 mg by mouth daily with breakfast.    . sildenafil (VIAGRA) 100 MG tablet Take 100 mg by mouth daily as needed for erectile dysfunction.     No current facility-administered medications for this visit.     REVIEW OF SYSTEMS:   [  X] denotes positive finding, [ ]  denotes negative finding Cardiac  Comments:  Chest pain or chest pressure:    Shortness of breath upon exertion:    Short of breath when lying flat:    Irregular heart rhythm:        Vascular    Pain in calf, thigh, or hip brought on by ambulation:    Pain in feet at night that wakes you up from your sleep:     Blood clot in your veins:    Leg swelling:         Pulmonary    Oxygen at home:    Productive cough:     Wheezing:         Neurologic    Sudden weakness in arms or legs:     Sudden numbness in arms or legs:     Sudden onset of difficulty speaking or slurred speech:    Temporary loss of vision in one eye:     Problems with dizziness:         Gastrointestinal    Blood in stool:     Vomited blood:         Genitourinary    Burning when urinating:       Blood in urine:        Psychiatric    Major depression:         Hematologic    Bleeding problems:    Problems with blood clotting too easily:        Skin    Rashes or ulcers:        Constitutional    Fever or chills:      PHYSICAL EXAM:   Vitals:   01/15/18 0837 01/15/18 0841  BP: (!) 166/70 (!) 161/69  Pulse: (!) 52   Resp: 20   SpO2: 98%   Weight: 261 lb (118.4 kg)   Height: 5' 7.5" (1.715 m)     GENERAL: The patient is a well-nourished male, in no acute distress. The vital signs are documented above. CARDIAC: There is a regular rate and rhythm.  VASCULAR: pulsatile left UE AVF PULMONARY: Non-labored respiration.  MUSCULOSKELETAL: There are no major deformities or cyanosis. NEUROLOGIC: No focal weakness or paresthesias are detected. SKIN: There are no ulcers or rashes noted. PSYCHIATRIC: The patient has a normal affect.  STUDIES:   I have reviewed his duplex with the following findings: Arteriovenous fistula-Elevated velocities and a velocity ratio of greater than 3 noted. Arteriovenous fistula-Elevated velocities noted in the proximal and mid upper arm, which may be due to a retained valve. Arteriovenous fistula-Residual lumen of less than 49mm noted in the confluence with subclavian vein.  MEDICAL ISSUES:   CKD:  The patient is scheduled for fistulogram on May 14.  I will access it neat the AC crease.  He is not on HD, so minimal IV dye will be used    Annamarie Major, MD [facility] [phone] Pager [phone]

## 2018-01-15 NOTE — H&P (View-Only) (Signed)
V[facility] and [facility] Specialist of [place]  Patient name: Peter [name] MRN: [id number] DOB: 24-Mar-1951 Sex: male   REASON FOR VISIT:    Follow up  HISOTRY OF PRESENT ILLNESS:    Peter [name] is a 67 y.o. male who is s/p left upper arm AVF by CSD in 2017.  It wa also elevated.  He is not yet on HD.  There has been a change in his fistula thrill recently and he was sent for evaluation   PAST MEDICAL HISTORY:   Past Medical History:  Diagnosis Date  . Anemia in chronic kidney disease (CKD)   . CKD (chronic kidney disease) stage 4, GFR 15-29 ml/min (HCC)   . Diabetes mellitus without complication ([place]) 63/0160   type II  . ED (erectile dysfunction)   . Hypercholesteremia   . Hypertension   . Kidney stone 2010  . 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 OSA (obstructive sleep apnea)    AHI 38/hr on CPAP at 12cm h2o  . 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     FAMILY HISTORY:   Family History  Problem Relation Age of Onset  . Dementia Mother   . Hypertension Father     SOCIAL HISTORY:   Social History   Tobacco Use  . Smoking status: Former Smoker    Types: Cigarettes    Last attempt to quit: 06/13/1986    Years since quitting: 31.6  . Smokeless tobacco: Former Systems developer    Types: Chew  Substance Use Topics  . Alcohol use: No     ALLERGIES:   Allergies  Allergen Reactions  . Penicillins Other (See Comments)    UNSPECIFIED REACTION Childhood allergy Has patient had a PCN reaction causing immediate rash, facial/tongue/throat swelling, SOB or lightheadedness with hypotension: Unknown Has patient had a PCN reaction causing severe rash involving mucus membranes or skin necrosis: No Has patient had a PCN reaction that required hospitalization No Has patient had a PCN reaction occurring within the last 10 years: No If all of the above answers are "NO", then may proceed with  Cephalosporin use.       CURRENT MEDICATIONS:   Current Outpatient Medications  Medication Sig Dispense Refill  . amLODipine (NORVASC) 10 MG tablet Take 10 mg by mouth daily.    Marland Kitchen aspirin 81 MG tablet Take 81 mg by mouth daily.    Marland Kitchen atenolol (TENORMIN) 100 MG tablet Take 100 mg by mouth daily.    Marland Kitchen atorvastatin (LIPITOR) 20 MG tablet Take 20 mg by mouth daily.    . calcitRIOL (ROCALTROL) 0.25 MCG capsule Take 0.25 mcg by mouth every Monday, Wednesday, and Friday.     . doxazosin (CARDURA) 2 MG tablet Take 2 mg by mouth at bedtime.  5  . furosemide (LASIX) 40 MG tablet Take 40 mg by mouth daily.     . hydrALAZINE (APRESOLINE) 100 MG tablet Take 100 mg by mouth 2 (two) times daily.    . potassium chloride SA (K-DUR,KLOR-CON) 20 MEQ tablet Take 40 mEq by mouth 2 (two) times daily. [place] kidney changed on 06/07/14    . glimepiride (AMARYL) 1 MG tablet Take 1 mg by mouth daily with breakfast.    . sildenafil (VIAGRA) 100 MG tablet Take 100 mg by mouth daily as needed for erectile dysfunction.     No current facility-administered medications for this visit.     REVIEW OF SYSTEMS:   [  X] denotes positive finding, [ ]  denotes negative finding Cardiac  Comments:  Chest pain or chest pressure:    Shortness of breath upon exertion:    Short of breath when lying flat:    Irregular heart rhythm:        Vascular    Pain in calf, thigh, or hip brought on by ambulation:    Pain in feet at night that wakes you up from your sleep:     Blood clot in your veins:    Leg swelling:         Pulmonary    Oxygen at home:    Productive cough:     Wheezing:         Neurologic    Sudden weakness in arms or legs:     Sudden numbness in arms or legs:     Sudden onset of difficulty speaking or slurred speech:    Temporary loss of vision in one eye:     Problems with dizziness:         Gastrointestinal    Blood in stool:     Vomited blood:         Genitourinary    Burning when urinating:       Blood in urine:        Psychiatric    Major depression:         Hematologic    Bleeding problems:    Problems with blood clotting too easily:        Skin    Rashes or ulcers:        Constitutional    Fever or chills:      PHYSICAL EXAM:   Vitals:   01/15/18 0837 01/15/18 0841  BP: (!) 166/70 (!) 161/69  Pulse: (!) 52   Resp: 20   SpO2: 98%   Weight: 261 lb (118.4 kg)   Height: 5' 7.5" (1.715 m)     GENERAL: The patient is a well-nourished male, in no acute distress. The vital signs are documented above. CARDIAC: There is a regular rate and rhythm.  VASCULAR: pulsatile left UE AVF PULMONARY: Non-labored respiration.  MUSCULOSKELETAL: There are no major deformities or cyanosis. NEUROLOGIC: No focal weakness or paresthesias are detected. SKIN: There are no ulcers or rashes noted. PSYCHIATRIC: The patient has a normal affect.  STUDIES:   I have reviewed his duplex with the following findings: Arteriovenous fistula-Elevated velocities and a velocity ratio of greater than 3 noted. Arteriovenous fistula-Elevated velocities noted in the proximal and mid upper arm, which may be due to a retained valve. Arteriovenous fistula-Residual lumen of less than 45mm noted in the confluence with subclavian vein.  MEDICAL ISSUES:   CKD:  The patient is scheduled for fistulogram on May 14.  I will access it neat the AC crease.  He is not on HD, so minimal IV dye will be used    Annamarie Major, MD [facility] [phone] Pager [phone]

## 2018-01-19 ENCOUNTER — Other Ambulatory Visit (HOSPITAL_COMMUNITY): Payer: Self-pay | Admitting: *Deleted

## 2018-01-20 ENCOUNTER — Ambulatory Visit (HOSPITAL_COMMUNITY)
Admission: RE | Admit: 2018-01-20 | Discharge: 2018-01-20 | Disposition: A | Discharge status: Home / Self Care | Payer: Medicare Other | Source: Ambulatory Visit | Attending: Nephrology | Admitting: Nephrology

## 2018-01-20 DIAGNOSIS — N189 Chronic kidney disease, unspecified: Secondary | ICD-10-CM | POA: Insufficient documentation

## 2018-01-20 DIAGNOSIS — D631 Anemia in chronic kidney disease: Secondary | ICD-10-CM | POA: Diagnosis present

## 2018-01-20 MED ORDER — SODIUM CHLORIDE 0.9 % IV SOLN
510.0000 mg | Freq: Once | INTRAVENOUS | Status: AC
  Administered 2018-01-20: 510 mg via INTRAVENOUS
  Filled 2018-01-20: qty 17

## 2018-01-20 NOTE — Discharge Instructions (Signed)

## 2018-01-26 ENCOUNTER — Encounter (HOSPITAL_COMMUNITY): Payer: Medicare Other

## 2018-01-30 ENCOUNTER — Other Ambulatory Visit (HOSPITAL_COMMUNITY): Payer: Self-pay | Admitting: *Deleted

## 2018-01-30 ENCOUNTER — Encounter (HOSPITAL_COMMUNITY): Payer: Medicare Other

## 2018-02-02 ENCOUNTER — Encounter: Payer: Self-pay | Admitting: Nephrology

## 2018-02-02 ENCOUNTER — Ambulatory Visit (HOSPITAL_COMMUNITY)
Admission: RE | Admit: 2018-02-02 | Discharge: 2018-02-02 | Disposition: A | Discharge status: Home / Self Care | Payer: Medicare Other | Source: Ambulatory Visit | Attending: Nephrology | Admitting: Nephrology

## 2018-02-02 DIAGNOSIS — D631 Anemia in chronic kidney disease: Secondary | ICD-10-CM | POA: Insufficient documentation

## 2018-02-02 DIAGNOSIS — N185 Chronic kidney disease, stage 5: Secondary | ICD-10-CM | POA: Diagnosis not present

## 2018-02-02 LAB — POCT HEMOGLOBIN-HEMACUE: Hemoglobin: 9.4 g/dL — ABNORMAL LOW (ref 13.0–17.0)

## 2018-02-02 LAB — IRON AND TIBC
Iron: 63 ug/dL (ref 45–182)
Saturation Ratios: 32 % (ref 17.9–39.5)
TIBC: 195 ug/dL — ABNORMAL LOW (ref 250–450)
UIBC: 132 ug/dL

## 2018-02-02 LAB — FERRITIN: Ferritin: 256 ng/mL (ref 24–336)

## 2018-02-02 MED ORDER — EPOETIN ALFA-EPBX 10000 UNIT/ML IJ SOLN
20000.0000 [IU] | Freq: Once | INTRAMUSCULAR | Status: AC
  Administered 2018-02-02: 20000 [IU] via SUBCUTANEOUS
  Filled 2018-02-02: qty 2

## 2018-02-02 MED ORDER — EPOETIN ALFA-EPBX 10000 UNIT/ML IJ SOLN
10000.0000 [IU] | INTRAMUSCULAR | Status: DC
  Administered 2018-02-02: 10000 [IU] via SUBCUTANEOUS
  Filled 2018-02-02: qty 1

## 2018-02-02 NOTE — Discharge Instructions (Signed)
Epoetin Alfa injection °What is this medicine? °EPOETIN ALFA (e POE e tin AL fa) helps your body make more red blood cells. This medicine is used to treat anemia caused by chronic kidney failure, cancer chemotherapy, or HIV-therapy. It may also be used before surgery if you have anemia. °This medicine may be used for other purposes; ask your health care provider or pharmacist if you have questions. °COMMON BRAND NAME(S): Epogen, Procrit °What should I tell my health care provider before I take this medicine? °They need to know if you have any of these conditions: °-blood clotting disorders °-cancer patient not on chemotherapy °-cystic fibrosis °-heart disease, such as angina or heart failure °-hemoglobin level of 12 g/dL or greater °-high blood pressure °-low levels of folate, iron, or vitamin B12 °-seizures °-an unusual or allergic reaction to erythropoietin, albumin, benzyl alcohol, hamster proteins, other medicines, foods, dyes, or preservatives °-pregnant or trying to get pregnant °-breast-feeding °How should I use this medicine? °This medicine is for injection into a vein or under the skin. It is usually given by a health care professional in a hospital or clinic setting. °If you get this medicine at home, you will be taught how to prepare and give this medicine. Use exactly as directed. Take your medicine at regular intervals. Do not take your medicine more often than directed. °It is important that you put your used needles and syringes in a special sharps container. Do not put them in a trash can. If you do not have a sharps container, call your pharmacist or healthcare provider to get one. °A special MedGuide will be given to you by the pharmacist with each prescription and refill. Be sure to read this information carefully each time. °Talk to your pediatrician regarding the use of this medicine in children. While this drug may be prescribed for selected conditions, precautions do apply. °Overdosage: If you  think you have taken too much of this medicine contact a poison control center or emergency room at once. °NOTE: This medicine is only for you. Do not share this medicine with others. °What if I miss a dose? °If you miss a dose, take it as soon as you can. If it is almost time for your next dose, take only that dose. Do not take double or extra doses. °What may interact with this medicine? °Do not take this medicine with any of the following medications: °-darbepoetin alfa °This list may not describe all possible interactions. Give your health care provider a list of all the medicines, herbs, non-prescription drugs, or dietary supplements you use. Also tell them if you smoke, drink alcohol, or use illegal drugs. Some items may interact with your medicine. °What should I watch for while using this medicine? °Your condition will be monitored carefully while you are receiving this medicine. °You may need blood work done while you are taking this medicine. °What side effects may I notice from receiving this medicine? °Side effects that you should report to your doctor or health care professional as soon as possible: °-allergic reactions like skin rash, itching or hives, swelling of the face, lips, or tongue °-breathing problems °-changes in vision °-chest pain °-confusion, trouble speaking or understanding °-feeling faint or lightheaded, falls °-high blood pressure °-muscle aches or pains °-pain, swelling, warmth in the leg °-rapid weight gain °-severe headaches °-sudden numbness or weakness of the face, arm or leg °-trouble walking, dizziness, loss of balance or coordination °-seizures (convulsions) °-swelling of the ankles, feet, hands °-unusually weak or tired °  Side effects that usually do not require medical attention (report to your doctor or health care professional if they continue or are bothersome): °-diarrhea °-fever, chills (flu-like symptoms) °-headaches °-nausea, vomiting °-redness, stinging, or swelling at  site where injected °This list may not describe all possible side effects. Call your doctor for medical advice about side effects. You may report side effects to FDA at 1-800-FDA-1088. °Where should I keep my medicine? °Keep out of the reach of children. °Store in a refrigerator between 2 and 8 degrees C (36 and 46 degrees F). Do not freeze or shake. Throw away any unused portion if using a single-dose vial. Multi-dose vials can be kept in the refrigerator for up to 21 days after the initial dose. Throw away unused medicine. °NOTE: This sheet is a summary. It may not cover all possible information. If you have questions about this medicine, talk to your doctor, pharmacist, or health care provider. °© 2018 [employer]vier/[employer] Standard (2016-05-06 19:42:31) ° °

## 2018-02-10 ENCOUNTER — Encounter (HOSPITAL_COMMUNITY)
Admission: RE | Disposition: A | Discharge status: Home / Self Care | Payer: Self-pay | Source: Ambulatory Visit | Attending: Surgery

## 2018-02-10 ENCOUNTER — Ambulatory Visit (HOSPITAL_COMMUNITY)
Admission: RE | Admit: 2018-02-10 | Discharge: 2018-02-10 | Disposition: A | Discharge status: Home / Self Care | Payer: Medicare Other | Source: Ambulatory Visit | Attending: Surgery | Admitting: Surgery

## 2018-02-10 ENCOUNTER — Encounter (HOSPITAL_COMMUNITY): Payer: Self-pay | Admitting: Surgery

## 2018-02-10 DIAGNOSIS — Z905 Acquired absence of kidney: Secondary | ICD-10-CM | POA: Insufficient documentation

## 2018-02-10 DIAGNOSIS — G4733 Obstructive sleep apnea (adult) (pediatric): Secondary | ICD-10-CM | POA: Insufficient documentation

## 2018-02-10 DIAGNOSIS — Z87891 Personal history of nicotine dependence: Secondary | ICD-10-CM | POA: Diagnosis not present

## 2018-02-10 DIAGNOSIS — E7800 Pure hypercholesterolemia, unspecified: Secondary | ICD-10-CM | POA: Diagnosis not present

## 2018-02-10 DIAGNOSIS — Z7984 Long term (current) use of oral hypoglycemic drugs: Secondary | ICD-10-CM | POA: Insufficient documentation

## 2018-02-10 DIAGNOSIS — E1122 Type 2 diabetes mellitus with diabetic chronic kidney disease: Secondary | ICD-10-CM | POA: Diagnosis not present

## 2018-02-10 DIAGNOSIS — Z79899 Other long term (current) drug therapy: Secondary | ICD-10-CM | POA: Insufficient documentation

## 2018-02-10 DIAGNOSIS — N529 Male erectile dysfunction, unspecified: Secondary | ICD-10-CM | POA: Diagnosis not present

## 2018-02-10 DIAGNOSIS — Z7982 Long term (current) use of aspirin: Secondary | ICD-10-CM | POA: Insufficient documentation

## 2018-02-10 DIAGNOSIS — N185 Chronic kidney disease, stage 5: Secondary | ICD-10-CM | POA: Diagnosis not present

## 2018-02-10 DIAGNOSIS — Z880 Allergy status to penicillin: Secondary | ICD-10-CM | POA: Diagnosis not present

## 2018-02-10 DIAGNOSIS — N186 End stage renal disease: Secondary | ICD-10-CM | POA: Diagnosis present

## 2018-02-10 DIAGNOSIS — T82898A Other specified complication of vascular prosthetic devices, implants and grafts, initial encounter: Secondary | ICD-10-CM | POA: Diagnosis not present

## 2018-02-10 DIAGNOSIS — Z87442 Personal history of urinary calculi: Secondary | ICD-10-CM | POA: Diagnosis not present

## 2018-02-10 DIAGNOSIS — I129 Hypertensive chronic kidney disease with stage 1 through stage 4 chronic kidney disease, or unspecified chronic kidney disease: Secondary | ICD-10-CM | POA: Diagnosis not present

## 2018-02-10 DIAGNOSIS — Z9889 Other specified postprocedural states: Secondary | ICD-10-CM | POA: Diagnosis not present

## 2018-02-10 DIAGNOSIS — N184 Chronic kidney disease, stage 4 (severe): Secondary | ICD-10-CM | POA: Diagnosis not present

## 2018-02-10 DIAGNOSIS — T82858A Stenosis of vascular prosthetic devices, implants and grafts, initial encounter: Secondary | ICD-10-CM | POA: Diagnosis not present

## 2018-02-10 DIAGNOSIS — Z8679 Personal history of other diseases of the circulatory system: Secondary | ICD-10-CM | POA: Insufficient documentation

## 2018-02-10 DIAGNOSIS — Z8249 Family history of ischemic heart disease and other diseases of the circulatory system: Secondary | ICD-10-CM | POA: Insufficient documentation

## 2018-02-10 HISTORY — PX: A/V FISTULAGRAM: CATH118298

## 2018-02-10 HISTORY — PX: PERIPHERAL VASCULAR INTERVENTION: CATH118257

## 2018-02-10 LAB — POCT I-STAT, CHEM 8
BUN: 53 mg/dL — ABNORMAL HIGH (ref 6–20)
Calcium, Ion: 1.1 mmol/L — ABNORMAL LOW (ref 1.15–1.40)
Chloride: 109 mmol/L (ref 101–111)
Creatinine, Ser: 6.7 mg/dL — ABNORMAL HIGH (ref 0.61–1.24)
Glucose, Bld: 96 mg/dL (ref 65–99)
HCT: 26 % — ABNORMAL LOW (ref 39.0–52.0)
Hemoglobin: 8.8 g/dL — ABNORMAL LOW (ref 13.0–17.0)
Potassium: 4 mmol/L (ref 3.5–5.1)
Sodium: 145 mmol/L (ref 135–145)
TCO2: 24 mmol/L (ref 22–32)

## 2018-02-10 SURGERY — A/V FISTULAGRAM
Anesthesia: LOCAL | Laterality: Left

## 2018-02-10 MED ORDER — HEPARIN (PORCINE) IN NACL 1000-0.9 UT/500ML-% IV SOLN
INTRAVENOUS | Status: AC
  Filled 2018-02-10: qty 500

## 2018-02-10 MED ORDER — FENTANYL CITRATE (PF) 100 MCG/2ML IJ SOLN
INTRAMUSCULAR | Status: DC | PRN
  Administered 2018-02-10: 25 ug via INTRAVENOUS

## 2018-02-10 MED ORDER — LIDOCAINE HCL (PF) 1 % IJ SOLN
INTRAMUSCULAR | Status: AC
  Filled 2018-02-10: qty 30

## 2018-02-10 MED ORDER — SODIUM CHLORIDE 0.9 % IV SOLN
INTRAVENOUS | Status: DC
  Administered 2018-02-10: 07:00:00 via INTRAVENOUS

## 2018-02-10 MED ORDER — HEPARIN (PORCINE) IN NACL 2-0.9 UNITS/ML
INTRAMUSCULAR | Status: AC | PRN
  Administered 2018-02-10: 500 mL

## 2018-02-10 MED ORDER — LIDOCAINE HCL (PF) 1 % IJ SOLN
INTRAMUSCULAR | Status: DC | PRN
  Administered 2018-02-10: 2 mL via INTRADERMAL

## 2018-02-10 MED ORDER — FENTANYL CITRATE (PF) 100 MCG/2ML IJ SOLN
INTRAMUSCULAR | Status: AC
  Filled 2018-02-10: qty 2

## 2018-02-10 MED ORDER — IODIXANOL 320 MG/ML IV SOLN
INTRAVENOUS | Status: DC | PRN
  Administered 2018-02-10: 13 mL via INTRAVENOUS

## 2018-02-10 SURGICAL SUPPLY — 20 items
BALLN LUTONIX 6X150X130 (BALLOONS) ×2
BALLN MUSTANG 5X60X75 (BALLOONS) ×2
BALLN MUSTANG 7X80X75 (BALLOONS) ×2
BALLOON LUTONIX 6X150X130 (BALLOONS) ×1 IMPLANT
BALLOON MUSTANG 5X60X75 (BALLOONS) ×1 IMPLANT
BALLOON MUSTANG 7X80X75 (BALLOONS) ×1 IMPLANT
COVER DOME SNAP 22 D (MISCELLANEOUS) ×2 IMPLANT
COVER PRB 48X5XTLSCP FOLD TPE (BAG) ×1 IMPLANT
COVER PROBE 5X48 (BAG) ×1
GUIDEWIRE ANGLED .035X150CM (WIRE) ×2 IMPLANT
KIT ENCORE 26 ADVANTAGE (KITS) ×2 IMPLANT
KIT MICROPUNCTURE NIT STIFF (SHEATH) ×2 IMPLANT
PROTECTION STATION PRESSURIZED (MISCELLANEOUS) ×2
SHEATH PINNACLE R/O II 6F 4CM (SHEATH) ×2 IMPLANT
STATION PROTECTION PRESSURIZED (MISCELLANEOUS) ×1 IMPLANT
STOPCOCK MORSE 400PSI 3WAY (MISCELLANEOUS) ×2 IMPLANT
TRAY PV CATH (CUSTOM PROCEDURE TRAY) ×2 IMPLANT
TUBING CIL FLEX 10 FLL-RA (TUBING) ×2 IMPLANT
WIRE BENTSON .035X145CM (WIRE) ×2 IMPLANT
WIRE ROSEN-J .035X260CM (WIRE) ×2 IMPLANT

## 2018-02-10 NOTE — Discharge Instructions (Signed)

## 2018-02-10 NOTE — Interval H&P Note (Signed)
History and Physical Interval Note:  02/10/2018 7:20 AM  [name]  has presented today for surgery, with the diagnosis of esrd  The various methods of treatment have been discussed with the patient and family. After consideration of risks, benefits and other options for treatment, the patient has consented to  Procedure(s): A/V FISTULAGRAM (Left) as a surgical intervention .  The patient's history has been reviewed, patient examined, no change in status, stable for surgery.  I have reviewed the patient's chart and labs.  Questions were answered to the patient's satisfaction.     Peter [name]

## 2018-02-10 NOTE — Op Note (Signed)
    Patient name: Peter [name] MRN: [id number] DOB: 04/09/1951 Sex: male  02/10/2018 Pre-operative Diagnosis: CKD Post-operative diagnosis:  Same Surgeon:  Annamarie Major Procedure Performed:  1.  Ultrasound-guided access, left cephalic vein  2.  Fistulogram  3.  Angioplasty, left cephalic vein, multiple inflations    Indications: Patient comes in today for fistulogram.  Pre evaluation ultrasound suggested stenosis  Procedure:  The patient was identified in the holding area and taken to room 8.  The patient was then placed supine on the table and prepped and draped in the usual sterile fashion.  A time out was called.  Ultrasound was used to evaluate the fistula.  The vein was patent and compressible.  A digital ultrasound image was acquired.  The fistula was then accessed under ultrasound guidance using a micropuncture needle.  An 018 wire was then asvanced without resistance and a micropuncture sheath was placed.  Contrast injections were then performed through the sheath.  Findings: Diffuse narrowing beginning at the junction of the cephalic vein and the deep system going approximately to the mid upper arm.     Intervention: Over a 5 wire a 6 French sheath was inserted.  I was able to advance theWire to the central venous system.  I then used a 7 x 80 Mustang balloon to perform balloon angioplasty of the cephalic vein beginning at the deep system going out to the mid upper arm.  The balloon was taken to nominal pressure for 2 minutes on each inflation.  There is a significant waist near the central venous system junction.  Follow-up imaging revealed persistent narrowing and irregularities throughout the vein.  I then inserted a 5 x 40 Mustang balloon and repeated balloon venoplasty throughout the previously treated segment of the vein.  Follow-up imaging was performed which showed some improvement in the quality of the lumen.  I then opened a 6 x 120 drug-coated Lutonix balloon and performed  balloon angioplasty again taking the balloon to 12 atm for 2 minutes.  I finally retreated the midportion of the vein with a 7 mm balloon.  Follow-up imaging revealed improved results throughout the treated segment.  There was a wider lumen however there is still was residual narrowing of at least 20%.  A total of 13 cc of contrast were utilized.  The patient is not yet on dialysis.  I did not want to proceed any further this time.  Patient may require long segment stenting.  At this point, catheters and wires were removed.  Manual pressure was used to remove the sheath.  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  Narrowing at several points was greater than 90%.  This was treated with multiple balloon diameters.  I was able to decrease the stenosis to around 20%.  Stenosis was very resistant.  #2  Total of 13 cc of contrast were utilized.  #3  If patient has trouble with fistula when he starts dialysis, I would consider long segment stenting at his next intervention.   Theotis Burrow, M.D. [facility] Office: [phone] Pager:  [phone]

## 2018-02-10 NOTE — Interval H&P Note (Signed)
History and Physical Interval Note:  02/10/2018 7:19 AM  [name]  has presented today for surgery, with the diagnosis of esrd  The various methods of treatment have been discussed with the patient and family. After consideration of risks, benefits and other options for treatment, the patient has consented to  Procedure(s): A/V FISTULAGRAM (Left) as a surgical intervention .  The patient's history has been reviewed, patient examined, no change in status, stable for surgery.  I have reviewed the patient's chart and labs.  Questions were answered to the patient's satisfaction.     Peter [name]

## 2018-03-02 ENCOUNTER — Encounter (HOSPITAL_COMMUNITY): Payer: Medicare Other

## 2018-03-09 ENCOUNTER — Ambulatory Visit (HOSPITAL_COMMUNITY)
Admission: RE | Admit: 2018-03-09 | Discharge: 2018-03-09 | Disposition: A | Discharge status: Home / Self Care | Payer: Medicare Other | Source: Ambulatory Visit | Attending: Nephrology | Admitting: Nephrology

## 2018-03-09 DIAGNOSIS — N185 Chronic kidney disease, stage 5: Secondary | ICD-10-CM | POA: Insufficient documentation

## 2018-03-09 DIAGNOSIS — D631 Anemia in chronic kidney disease: Secondary | ICD-10-CM | POA: Insufficient documentation

## 2018-03-09 DIAGNOSIS — Z5181 Encounter for therapeutic drug level monitoring: Secondary | ICD-10-CM | POA: Diagnosis not present

## 2018-03-09 DIAGNOSIS — Z79899 Other long term (current) drug therapy: Secondary | ICD-10-CM | POA: Diagnosis not present

## 2018-03-09 LAB — FERRITIN: Ferritin: 240 ng/mL (ref 24–336)

## 2018-03-09 LAB — IRON AND TIBC
Iron: 67 ug/dL (ref 45–182)
Saturation Ratios: 30 % (ref 17.9–39.5)
TIBC: 224 ug/dL — ABNORMAL LOW (ref 250–450)
UIBC: 157 ug/dL

## 2018-03-09 MED ORDER — EPOETIN ALFA-EPBX 10000 UNIT/ML IJ SOLN
30000.0000 [IU] | INTRAMUSCULAR | Status: DC
  Administered 2018-03-09: 30000 [IU] via SUBCUTANEOUS
  Filled 2018-03-09: qty 3

## 2018-03-09 MED ORDER — EPOETIN ALFA-EPBX 10000 UNIT/ML IJ SOLN
10000.0000 [IU] | INTRAMUSCULAR | Status: DC
  Filled 2018-03-09: qty 1

## 2018-03-10 LAB — POCT HEMOGLOBIN-HEMACUE: Hemoglobin: 10 g/dL — ABNORMAL LOW (ref 13.0–17.0)

## 2018-04-06 ENCOUNTER — Encounter (HOSPITAL_COMMUNITY): Payer: Medicare Other

## 2018-05-04 ENCOUNTER — Encounter (HOSPITAL_COMMUNITY): Payer: Medicare Other

## 2018-05-18 ENCOUNTER — Other Ambulatory Visit (HOSPITAL_COMMUNITY): Payer: Self-pay | Admitting: *Deleted

## 2018-05-19 ENCOUNTER — Ambulatory Visit (HOSPITAL_COMMUNITY)
Admission: RE | Admit: 2018-05-19 | Discharge: 2018-05-19 | Disposition: A | Discharge status: Home / Self Care | Payer: Medicare Other | Source: Ambulatory Visit | Attending: Nephrology | Admitting: Nephrology

## 2018-05-19 DIAGNOSIS — N185 Chronic kidney disease, stage 5: Secondary | ICD-10-CM | POA: Insufficient documentation

## 2018-05-19 DIAGNOSIS — D631 Anemia in chronic kidney disease: Secondary | ICD-10-CM | POA: Insufficient documentation

## 2018-05-19 LAB — IRON AND TIBC
Iron: 62 ug/dL (ref 45–182)
Saturation Ratios: 30 % (ref 17.9–39.5)
TIBC: 209 ug/dL — ABNORMAL LOW (ref 250–450)
UIBC: 147 ug/dL

## 2018-05-19 LAB — FERRITIN: Ferritin: 150 ng/mL (ref 24–336)

## 2018-05-19 LAB — POCT HEMOGLOBIN-HEMACUE: Hemoglobin: 10.8 g/dL — ABNORMAL LOW (ref 13.0–17.0)

## 2018-05-19 MED ORDER — EPOETIN ALFA-EPBX 10000 UNIT/ML IJ SOLN
30000.0000 [IU] | Freq: Once | INTRAMUSCULAR | Status: AC
  Administered 2018-05-19: 30000 [IU] via SUBCUTANEOUS
  Filled 2018-05-19: qty 3

## 2018-05-29 ENCOUNTER — Encounter (HOSPITAL_COMMUNITY): Payer: Self-pay

## 2018-05-29 ENCOUNTER — Emergency Department (HOSPITAL_COMMUNITY): Payer: Worker's Compensation

## 2018-05-29 ENCOUNTER — Observation Stay (HOSPITAL_COMMUNITY)
Admission: EM | Admit: 2018-05-29 | Discharge: 2018-05-30 | Disposition: A | Discharge status: Home / Self Care | Payer: Worker's Compensation | Attending: Internal Medicine | Admitting: Internal Medicine

## 2018-05-29 DIAGNOSIS — N185 Chronic kidney disease, stage 5: Secondary | ICD-10-CM | POA: Insufficient documentation

## 2018-05-29 DIAGNOSIS — R001 Bradycardia, unspecified: Secondary | ICD-10-CM | POA: Insufficient documentation

## 2018-05-29 DIAGNOSIS — R55 Syncope and collapse: Secondary | ICD-10-CM | POA: Diagnosis present

## 2018-05-29 DIAGNOSIS — I132 Hypertensive heart and chronic kidney disease with heart failure and with stage 5 chronic kidney disease, or end stage renal disease: Secondary | ICD-10-CM | POA: Diagnosis not present

## 2018-05-29 DIAGNOSIS — I441 Atrioventricular block, second degree: Secondary | ICD-10-CM

## 2018-05-29 DIAGNOSIS — I959 Hypotension, unspecified: Secondary | ICD-10-CM | POA: Insufficient documentation

## 2018-05-29 DIAGNOSIS — Z880 Allergy status to penicillin: Secondary | ICD-10-CM | POA: Diagnosis not present

## 2018-05-29 DIAGNOSIS — Z6841 Body Mass Index (BMI) 40.0 and over, adult: Secondary | ICD-10-CM | POA: Diagnosis not present

## 2018-05-29 DIAGNOSIS — Z79899 Other long term (current) drug therapy: Secondary | ICD-10-CM | POA: Insufficient documentation

## 2018-05-29 DIAGNOSIS — E785 Hyperlipidemia, unspecified: Secondary | ICD-10-CM | POA: Diagnosis not present

## 2018-05-29 DIAGNOSIS — R7989 Other specified abnormal findings of blood chemistry: Secondary | ICD-10-CM | POA: Diagnosis present

## 2018-05-29 DIAGNOSIS — Z87891 Personal history of nicotine dependence: Secondary | ICD-10-CM | POA: Insufficient documentation

## 2018-05-29 DIAGNOSIS — G4733 Obstructive sleep apnea (adult) (pediatric): Secondary | ICD-10-CM | POA: Diagnosis not present

## 2018-05-29 DIAGNOSIS — I5030 Unspecified diastolic (congestive) heart failure: Secondary | ICD-10-CM | POA: Diagnosis not present

## 2018-05-29 DIAGNOSIS — R778 Other specified abnormalities of plasma proteins: Secondary | ICD-10-CM | POA: Diagnosis present

## 2018-05-29 DIAGNOSIS — E1122 Type 2 diabetes mellitus with diabetic chronic kidney disease: Secondary | ICD-10-CM | POA: Diagnosis not present

## 2018-05-29 DIAGNOSIS — E7800 Pure hypercholesterolemia, unspecified: Secondary | ICD-10-CM | POA: Diagnosis not present

## 2018-05-29 DIAGNOSIS — I340 Nonrheumatic mitral (valve) insufficiency: Secondary | ICD-10-CM | POA: Diagnosis not present

## 2018-05-29 DIAGNOSIS — Z9989 Dependence on other enabling machines and devices: Secondary | ICD-10-CM | POA: Insufficient documentation

## 2018-05-29 DIAGNOSIS — I341 Nonrheumatic mitral (valve) prolapse: Secondary | ICD-10-CM | POA: Diagnosis not present

## 2018-05-29 DIAGNOSIS — Z905 Acquired absence of kidney: Secondary | ICD-10-CM | POA: Insufficient documentation

## 2018-05-29 DIAGNOSIS — I5189 Other ill-defined heart diseases: Secondary | ICD-10-CM

## 2018-05-29 DIAGNOSIS — I10 Essential (primary) hypertension: Secondary | ICD-10-CM | POA: Diagnosis present

## 2018-05-29 DIAGNOSIS — Z7982 Long term (current) use of aspirin: Secondary | ICD-10-CM | POA: Insufficient documentation

## 2018-05-29 DIAGNOSIS — R748 Abnormal levels of other serum enzymes: Secondary | ICD-10-CM | POA: Diagnosis not present

## 2018-05-29 HISTORY — DX: Syncope and collapse: R55

## 2018-05-29 LAB — COMPREHENSIVE METABOLIC PANEL
ALT: 12 U/L (ref 0–44)
AST: 12 U/L — ABNORMAL LOW (ref 15–41)
Albumin: 3.5 g/dL (ref 3.5–5.0)
Alkaline Phosphatase: 46 U/L (ref 38–126)
Anion gap: 14 (ref 5–15)
BUN: 74 mg/dL — ABNORMAL HIGH (ref 8–23)
CO2: 22 mmol/L (ref 22–32)
Calcium: 10.3 mg/dL (ref 8.9–10.3)
Chloride: 108 mmol/L (ref 98–111)
Creatinine, Ser: 8.26 mg/dL — ABNORMAL HIGH (ref 0.61–1.24)
GFR calc Af Amer: 7 mL/min — ABNORMAL LOW (ref >60)
GFR calc non Af Amer: 6 mL/min — ABNORMAL LOW (ref >60)
Glucose, Bld: 80 mg/dL (ref 70–99)
Potassium: 3.3 mmol/L — ABNORMAL LOW (ref 3.5–5.1)
Sodium: 144 mmol/L (ref 135–145)
Total Bilirubin: 0.7 mg/dL (ref 0.3–1.2)
Total Protein: 6.9 g/dL (ref 6.5–8.1)

## 2018-05-29 LAB — TSH: TSH: 8.118 u[IU]/mL — ABNORMAL HIGH (ref 0.350–4.500)

## 2018-05-29 LAB — COOXEMETRY PANEL
Carboxyhemoglobin: 1.7 % — ABNORMAL HIGH (ref 0.5–1.5)
Methemoglobin: 1 % (ref 0.0–1.5)
O2 Saturation: 81.6 %
Total hemoglobin: 11.3 g/dL — ABNORMAL LOW (ref 12.0–16.0)

## 2018-05-29 LAB — CBC WITH DIFFERENTIAL/PLATELET
Abs Immature Granulocytes: 0.1 10*3/uL (ref 0.0–0.1)
Basophils Absolute: 0.1 10*3/uL (ref 0.0–0.1)
Basophils Relative: 1 %
Eosinophils Absolute: 0.4 10*3/uL (ref 0.0–0.7)
Eosinophils Relative: 5 %
HCT: 35.2 % — ABNORMAL LOW (ref 39.0–52.0)
Hemoglobin: 10.9 g/dL — ABNORMAL LOW (ref 13.0–17.0)
Immature Granulocytes: 1 %
Lymphocytes Relative: 11 %
Lymphs Abs: 1.1 10*3/uL (ref 0.7–4.0)
MCH: 30.4 pg (ref 26.0–34.0)
MCHC: 31 g/dL (ref 30.0–36.0)
MCV: 98.1 fL (ref 78.0–100.0)
Monocytes Absolute: 0.8 10*3/uL (ref 0.1–1.0)
Monocytes Relative: 9 %
Neutro Abs: 7 10*3/uL (ref 1.7–7.7)
Neutrophils Relative %: 75 %
Platelets: 161 10*3/uL (ref 150–400)
RBC: 3.59 MIL/uL — ABNORMAL LOW (ref 4.22–5.81)
RDW: 14.2 % (ref 11.5–15.5)
WBC: 9.4 10*3/uL (ref 4.0–10.5)

## 2018-05-29 LAB — URINALYSIS, ROUTINE W REFLEX MICROSCOPIC
Bacteria, UA: NONE SEEN
Bilirubin Urine: NEGATIVE
Glucose, UA: 50 mg/dL — AB
Hgb urine dipstick: NEGATIVE
Ketones, ur: NEGATIVE mg/dL
Leukocytes, UA: NEGATIVE
Nitrite: NEGATIVE
Protein, ur: 30 mg/dL — AB
Specific Gravity, Urine: 1.009 (ref 1.005–1.030)
pH: 5 (ref 5.0–8.0)

## 2018-05-29 LAB — MAGNESIUM: Magnesium: 2.3 mg/dL (ref 1.7–2.4)

## 2018-05-29 LAB — T4, FREE: Free T4: 0.89 ng/dL (ref 0.82–1.77)

## 2018-05-29 LAB — I-STAT TROPONIN, ED: Troponin i, poc: 0.03 ng/mL (ref 0.00–0.08)

## 2018-05-29 LAB — TROPONIN I: Troponin I: 0.04 ng/mL (ref <0.03)

## 2018-05-29 LAB — BRAIN NATRIURETIC PEPTIDE: B Natriuretic Peptide: 186.1 pg/mL — ABNORMAL HIGH (ref 0.0–100.0)

## 2018-05-29 MED ORDER — SODIUM CHLORIDE 0.9 % IV BOLUS
500.0000 mL | Freq: Once | INTRAVENOUS | Status: AC
  Administered 2018-05-29: 500 mL via INTRAVENOUS

## 2018-05-29 NOTE — ED Notes (Signed)
Phlebotomy unsuccessful. Plan is to get a more experienced phlebotomist to try.

## 2018-05-29 NOTE — ED Notes (Signed)
This RN began the process of following up with the provider, RT and phlebotomy regarding the delay with the cooxemetry lab. RT says they do not do this lab. I have attempted to get blood and phlebotomy has attempted. I have spoken with charge and requested help from another phlebotomist.

## 2018-05-29 NOTE — ED Notes (Signed)
Patient transported to X-ray 
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
## 2018-05-30 ENCOUNTER — Encounter (HOSPITAL_COMMUNITY): Payer: Self-pay | Admitting: Emergency Medicine

## 2018-05-30 ENCOUNTER — Observation Stay (HOSPITAL_BASED_OUTPATIENT_CLINIC_OR_DEPARTMENT_OTHER): Payer: Worker's Compensation

## 2018-05-30 ENCOUNTER — Other Ambulatory Visit: Payer: Self-pay

## 2018-05-30 DIAGNOSIS — R748 Abnormal levels of other serum enzymes: Secondary | ICD-10-CM | POA: Diagnosis not present

## 2018-05-30 DIAGNOSIS — R55 Syncope and collapse: Secondary | ICD-10-CM

## 2018-05-30 DIAGNOSIS — R7989 Other specified abnormal findings of blood chemistry: Secondary | ICD-10-CM | POA: Diagnosis present

## 2018-05-30 DIAGNOSIS — R778 Other specified abnormalities of plasma proteins: Secondary | ICD-10-CM | POA: Diagnosis present

## 2018-05-30 DIAGNOSIS — N185 Chronic kidney disease, stage 5: Secondary | ICD-10-CM

## 2018-05-30 DIAGNOSIS — R001 Bradycardia, unspecified: Secondary | ICD-10-CM | POA: Diagnosis not present

## 2018-05-30 LAB — CBC
HCT: 32.4 % — ABNORMAL LOW (ref 39.0–52.0)
Hemoglobin: 10.7 g/dL — ABNORMAL LOW (ref 13.0–17.0)
MCH: 30.9 pg (ref 26.0–34.0)
MCHC: 33 g/dL (ref 30.0–36.0)
MCV: 93.6 fL (ref 78.0–100.0)
Platelets: 162 10*3/uL (ref 150–400)
RBC: 3.46 MIL/uL — ABNORMAL LOW (ref 4.22–5.81)
RDW: 14.2 % (ref 11.5–15.5)
WBC: 10.6 10*3/uL — ABNORMAL HIGH (ref 4.0–10.5)

## 2018-05-30 LAB — COMPREHENSIVE METABOLIC PANEL
ALT: 12 U/L (ref 0–44)
AST: 13 U/L — ABNORMAL LOW (ref 15–41)
Albumin: 3.3 g/dL — ABNORMAL LOW (ref 3.5–5.0)
Alkaline Phosphatase: 46 U/L (ref 38–126)
Anion gap: 14 (ref 5–15)
BUN: 76 mg/dL — ABNORMAL HIGH (ref 8–23)
CO2: 24 mmol/L (ref 22–32)
Calcium: 9.7 mg/dL (ref 8.9–10.3)
Chloride: 106 mmol/L (ref 98–111)
Creatinine, Ser: 8.18 mg/dL — ABNORMAL HIGH (ref 0.61–1.24)
GFR calc Af Amer: 7 mL/min — ABNORMAL LOW (ref >60)
GFR calc non Af Amer: 6 mL/min — ABNORMAL LOW (ref >60)
Glucose, Bld: 145 mg/dL — ABNORMAL HIGH (ref 70–99)
Potassium: 3.2 mmol/L — ABNORMAL LOW (ref 3.5–5.1)
Sodium: 144 mmol/L (ref 135–145)
Total Bilirubin: 0.8 mg/dL (ref 0.3–1.2)
Total Protein: 6.7 g/dL (ref 6.5–8.1)

## 2018-05-30 LAB — HEMOGLOBIN A1C
Hgb A1c MFr Bld: 5.7 % — ABNORMAL HIGH (ref 4.8–5.6)
Mean Plasma Glucose: 116.89 mg/dL

## 2018-05-30 LAB — ECHOCARDIOGRAM COMPLETE
Height: 67 in
Weight: 4159.992 oz

## 2018-05-30 LAB — GLUCOSE, CAPILLARY
Glucose-Capillary: 124 mg/dL — ABNORMAL HIGH (ref 70–99)
Glucose-Capillary: 79 mg/dL (ref 70–99)
Glucose-Capillary: 89 mg/dL (ref 70–99)

## 2018-05-30 LAB — TROPONIN I
Troponin I: 0.05 ng/mL (ref <0.03)
Troponin I: 0.06 ng/mL (ref <0.03)
Troponin I: 0.05 ng/mL (ref <0.03)

## 2018-05-30 LAB — HIV ANTIBODY (ROUTINE TESTING W REFLEX): HIV Screen 4th Generation wRfx: NONREACTIVE

## 2018-05-30 MED ORDER — ACETAMINOPHEN 650 MG RE SUPP: 650.0000 mg | Freq: Four times a day (QID) | RECTAL | Status: DC | PRN

## 2018-05-30 MED ORDER — ONDANSETRON HCL 4 MG/2ML IJ SOLN: 4.0000 mg | Freq: Four times a day (QID) | INTRAMUSCULAR | Status: DC | PRN

## 2018-05-30 MED ORDER — ONDANSETRON HCL 4 MG PO TABS: 4.0000 mg | ORAL_TABLET | Freq: Four times a day (QID) | ORAL | Status: DC | PRN

## 2018-05-30 MED ORDER — PERFLUTREN LIPID MICROSPHERE
1.0000 mL | INTRAVENOUS | Status: AC | PRN
  Administered 2018-05-30: 2 mL via INTRAVENOUS
  Filled 2018-05-30: qty 10

## 2018-05-30 MED ORDER — ACETAMINOPHEN 325 MG PO TABS: 650.0000 mg | ORAL_TABLET | Freq: Four times a day (QID) | ORAL | Status: DC | PRN

## 2018-05-30 MED ORDER — POTASSIUM CHLORIDE CRYS ER 20 MEQ PO TBCR
40.0000 meq | EXTENDED_RELEASE_TABLET | Freq: Once | ORAL | Status: AC
  Administered 2018-05-30: 40 meq via ORAL
  Filled 2018-05-30: qty 2

## 2018-05-30 MED ORDER — AMLODIPINE BESYLATE 10 MG PO TABS
10.0000 mg | ORAL_TABLET | Freq: Every day | ORAL | Status: DC
  Administered 2018-05-30: 10 mg via ORAL
  Filled 2018-05-30: qty 1

## 2018-05-30 MED ORDER — ATORVASTATIN CALCIUM 20 MG PO TABS
20.0000 mg | ORAL_TABLET | Freq: Every day | ORAL | Status: DC
  Administered 2018-05-30: 20 mg via ORAL
  Filled 2018-05-30: qty 1

## 2018-05-30 MED ORDER — HYDRALAZINE HCL 50 MG PO TABS
100.0000 mg | ORAL_TABLET | Freq: Every day | ORAL | Status: DC
  Administered 2018-05-30: 100 mg via ORAL
  Filled 2018-05-30: qty 2

## 2018-05-30 MED ORDER — CALCITRIOL 0.5 MCG PO CAPS
0.5000 ug | ORAL_CAPSULE | Freq: Two times a day (BID) | ORAL | Status: DC
  Administered 2018-05-30 (×2): 0.5 ug via ORAL
  Filled 2018-05-30 (×2): qty 1

## 2018-05-30 MED ORDER — ENOXAPARIN SODIUM 30 MG/0.3ML ~~LOC~~ SOLN
30.0000 mg | Freq: Every day | SUBCUTANEOUS | Status: DC
  Administered 2018-05-30: 30 mg via SUBCUTANEOUS
  Filled 2018-05-30: qty 0.3

## 2018-05-30 MED ORDER — INSULIN ASPART 100 UNIT/ML ~~LOC~~ SOLN: 0.0000 [IU] | SUBCUTANEOUS | Status: DC

## 2018-05-30 MED ORDER — SODIUM CHLORIDE 0.9% FLUSH
3.0000 mL | Freq: Two times a day (BID) | INTRAVENOUS | Status: DC
  Administered 2018-05-30 (×2): 3 mL via INTRAVENOUS

## 2018-05-30 NOTE — Progress Notes (Signed)
  Echocardiogram 2D Echocardiogram has been performed.  Peter [name] 05/30/2018, 10:02 AM

## 2018-05-30 NOTE — Progress Notes (Signed)
  Echocardiogram 2D Echocardiogram has been performed.  Peter [name] 05/30/2018, 12:43 PM

## 2018-05-30 NOTE — Consult Note (Addendum)
Cardiology Consultation:   Patient ID: Peter [name]; [id number]; 11-28-1950   Admit date: 05/29/2018 Date of Consult: 05/30/2018  Primary Care Provider: Gaynelle Arabian, MD Primary Cardiologist: Dr. Golden Hurter, MD  Patient Profile:   Peter [name] is a 67 y.o. 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 Roel Cluck.  History of Present Illness:   Peter [name] is a 67 year old male with a history stated above presented to [facility] on 05/29/2018 s/p syncopal episode.  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  He states his recent oral intake has been decreased. He denies chest pain, palpitations, dizziness, nausea, vomiting, recent illness or prior syncopal episodes.  He had no prodromal symptoms. Per chart review, he was hypotensive at the scene with systolic blood pressures in the 70s.  No multiple antihypertensive agents. BP improved with IV fluids, up to 130's/70's.  Reports that he is not currently on dialysis however has a functional graft.  He is followed by nephrology and his primary care physician for this. He was transferred to [facility] for further evaluation.  Denies prior personal history of CAD.  In the ED, EKG revealed sinus bradycardia with questionable Wenkebach? Subsequent EKG after arrival showed sinus bradycardia with first-degree AV block and occasional PVC. K+ was found to be mildly low at 3.3.  Creatinine elevated at 8.18.  Initial i-STAT troponin at 0.03, with subsequent readings at 0.04, 0.05.  TSH abnormal at 8.118.  CXR revealed stable mild cardiomegaly with no active cardiopulmonary  disease.  Prior echocardiogram from 10/24/2015 with LVEF of 60 to 65% with G2 DD mild mitral regurgitation.  Of note he was last seen by Dr. Fransico Him of 07/2016 for hypertension sleep apnea follow-up.  Per chart review, he was doing well, tolerating his device with no complaints of daytime sleepiness, chest pain, shortness of breath, DOE, dizziness, palpitations or syncope. He had evidence of chronic LE edema which was noted to be stable. At that time, he was on atenolol and multiple other agents for BP control.  Past Medical History:  Diagnosis Date  . Anemia in chronic kidney disease (CKD)   . CKD (chronic kidney disease) stage 5, GFR less than 15 ml/min (HCC)   . Diabetes mellitus without complication ([place]) 57/4734   type II  . ED (erectile dysfunction)   . Hypercholesteremia   . Hypertension   . Iron deficiency   . Kidney stone 2010  . 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 OSA (obstructive sleep apnea)    AHI 38/hr on CPAP at 12cm h2o  . 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    Past Surgical History:  Procedure Laterality Date  . A/V FISTULAGRAM Left 02/10/2018   Procedure: A/V FISTULAGRAM;  Surgeon: Serafina Mitchell, MD;  Location: [place] [facility];  Service: Cardiovascular;  Laterality: Left;  . AV FISTULA PLACEMENT Left 06/14/2016   Procedure: ARTERIOVENOUS (AV) FISTULA CREATION LEFT ARM;  Surgeon: Angelia Mould, MD;  Location: [place];  Service: Vascular;  Laterality: Left;  .  COLONOSCOPY    . JOINT REPLACEMENT Left 2010   hip  . PARTIAL NEPHRECTOMY Left 2010  . PERIPHERAL VASCULAR INTERVENTION Left 02/10/2018   Procedure: PERIPHERAL VASCULAR INTERVENTION;  Surgeon: Serafina Mitchell, MD;  Location: [facility];  Service: Cardiovascular;  Laterality: Left;  . TONSILLECTOMY       Prior to Admission medications   Medication Sig Start Date End Date Taking? Authorizing Provider  amLODipine (NORVASC) 10 MG tablet  Take 10 mg by mouth daily.   Yes [provider]  atenolol (TENORMIN) 100 MG tablet Take 100 mg by mouth daily. 04/15/16  Yes [provider]  atorvastatin (LIPITOR) 20 MG tablet Take 20 mg by mouth daily. 03/17/16  Yes [provider]  calcitRIOL (ROCALTROL) 0.25 MCG capsule Take 0.5 mcg by mouth 2 (two) times daily.    Yes [provider]  calcium carbonate (TUMS - DOSED IN MG ELEMENTAL CALCIUM) 500 MG chewable tablet Chew 1 tablet by mouth 3 (three) times daily with meals as needed for indigestion or heartburn.   Yes [provider]  doxazosin (CARDURA) 2 MG tablet Take 2 mg by mouth 2 (two) times daily.  12/31/17  Yes [provider]  furosemide (LASIX) 80 MG tablet Take 80 mg by mouth 2 (two) times daily. 12/31/17  Yes [provider]  hydrALAZINE (APRESOLINE) 100 MG tablet Take 100 mg by mouth every morning.    Yes [provider]  potassium chloride SA (K-DUR,KLOR-CON) 20 MEQ tablet Take 20 mEq by mouth daily.  12/09/13  Yes Sueanne Margarita, MD    Inpatient Medications: Scheduled Meds: . amLODipine  10 mg Oral Daily  . atorvastatin  20 mg Oral Daily  . calcitRIOL  0.5 mcg Oral BID  . enoxaparin (LOVENOX) injection  30 mg Subcutaneous Daily  . hydrALAZINE  100 mg Oral Daily  . insulin aspart  0-9 Units Subcutaneous Q4H  . sodium chloride flush  3 mL Intravenous Q12H   Continuous Infusions:  PRN Meds: acetaminophen **OR** acetaminophen, ondansetron **OR** ondansetron (ZOFRAN) IV  Allergies:    Allergies  Allergen Reactions  . Penicillins Other (See Comments)    UNSPECIFIED REACTION Childhood allergy Has patient had a PCN reaction causing immediate rash, facial/tongue/throat swelling, SOB or lightheadedness with hypotension: Unknown Has patient had a PCN reaction causing severe rash involving mucus membranes or skin necrosis: No Has patient had a PCN reaction that required hospitalization No Has patient had a PCN  reaction occurring within the last 10 years: No If all of the above answers are "NO", then may proceed with Cephalosporin use.      Social History:   Social History   Socioeconomic History  . Marital status: Soil scientist    Spouse name: Not on file  . Number of children: Not on file  . Years of education: Not on file  . Highest education level: Not on file  Occupational History  . Not on file  Social Needs  . Financial resource strain: Not hard at all  . Food insecurity:    Worry: Never true    Inability: Never true  . Transportation needs:    Medical: No    Non-medical: No  Tobacco Use  . Smoking status: Former Smoker    Types: Cigarettes    Last attempt to quit: 06/13/1986    Years since quitting: 31.9  . Smokeless tobacco: Former Systems developer    Types: Chew  Substance and Sexual Activity  . Alcohol use: No  .  Drug use: No  . Sexual activity: Yes  Lifestyle  . Physical activity:    Days per week: 0 days    Minutes per session: 0 min  . Stress: Only a little  Relationships  . Social connections:    Talks on phone: More than three times a week    Gets together: More than three times a week    Attends religious service: More than 4 times per year    Active member of club or organization: Yes    Attends meetings of clubs or organizations: More than 4 times per year    Relationship status: Living with partner  . Intimate partner violence:    Fear of current or ex partner: No    Emotionally abused: No    Physically abused: No    Forced sexual activity: No  Other Topics Concern  . Not on file  Social History Narrative  . Not on file    Family History:   Family History  Problem Relation Age of Onset  . Dementia Mother   . Hypertension Father    Family Status:  Family Status  Relation Name Status  . Mother  Deceased  . Father  Deceased  . Brother  Alive  . MGM  Deceased  . MGF  Deceased  . PGM  Deceased  . PGF  Deceased    ROS:  Please see the history  of present illness.  All other ROS reviewed and negative.     Physical Exam/Data:   Vitals:   05/30/18 0030 05/30/18 0100 05/30/18 0500 05/30/18 0618  BP: (!) 144/58 (!) 149/66  (!) 143/59  Pulse: (!) 46 (!) 46  (!) 51  Resp: 15 16  16   Temp:  98.3 F (36.8 C)  98.7 F (37.1 C)  TempSrc:  Oral  Oral  SpO2: 98% 100%  94%  Weight:   117.9 kg   Height:        Intake/Output Summary (Last 24 hours) at 05/30/2018 0742 Last data filed at 05/30/2018 0344 Gross per 24 hour  Intake 1490 ml  Output -  Net 1490 ml   Filed Weights   05/29/18 1734 05/30/18 0500  Weight: 117.9 kg 117.9 kg   Body mass index is 40.72 kg/m.   General: Well developed, well nourished, NAD Skin: Warm, dry, intact  Head: Normocephalic, atraumatic, clear, moist mucus membranes. Neck: Negative for carotid bruits. No JVD Lungs:Clear to ausculation bilaterally. No wheezes, rales, or rhonchi. Breathing is unlabored. Cardiovascular: RRR with S1 S2. No murmurs, rubs, gallops, or LV heave appreciated. Abdomen: Soft, non-tender, non-distended with normoactive bowel sounds. No obvious abdominal masses. MSK: Strength and tone appear normal for age. 5/5 in all extremities Extremities: No edema. No clubbing or cyanosis. DP/PT pulses 2+ bilaterally Neuro: Alert and oriented. No focal deficits. No facial asymmetry. MAE spontaneously. Psych: Responds to questions appropriately with normal affect.     EKG:  The EKG was personally reviewed and demonstrates: 05/30/2017 sinus bradycardia with first-degree AV block, PVC. Telemetry:  Telemetry was personally reviewed and demonstrates: 05/30/2018 sinus bradycardia 40-50's with first-degree AV block and occasional PVCs  Relevant CV Studies:  ECHO: 10/24/2015: Study Conclusions  - Left ventricle: The cavity size was mildly dilated. There was   moderate concentric hypertrophy. Systolic function was normal.   The estimated ejection fraction was in the range of 60% to 65%.   Wall  motion was normal; there were no regional wall motion   abnormalities. Features are consistent with  a pseudonormal left   ventricular filling pattern, with concomitant abnormal relaxation   and increased filling pressure (grade 2 diastolic dysfunction).   Doppler parameters are consistent with high ventricular filling   pressure. - Mitral valve: There was mild regurgitation. - Left atrium: The atrium was moderately dilated. - Right ventricle: The cavity size was normal. Wall thickness was   normal. Systolic function was normal. - Tricuspid valve: There was no regurgitation. - Inferior vena cava: The vessel was normal in size. The   respirophasic diameter changes were in the normal range (= 50%),   consistent with normal central venous pressure.  CATH: None  Laboratory Data:  Chemistry Recent Labs  Lab 05/29/18 1807 05/30/18 0249  NA 144 144  K 3.3* 3.2*  CL 108 106  CO2 22 24  GLUCOSE 80 145*  BUN 74* 76*  CREATININE 8.26* 8.18*  CALCIUM 10.3 9.7  GFRNONAA 6* 6*  GFRAA 7* 7*  ANIONGAP 14 14    Total Protein  Date Value Ref Range Status  05/30/2018 6.7 6.5 - 8.1 g/dL Final   Albumin  Date Value Ref Range Status  05/30/2018 3.3 (L) 3.5 - 5.0 g/dL Final   AST  Date Value Ref Range Status  05/30/2018 13 (L) 15 - 41 U/L Final   ALT  Date Value Ref Range Status  05/30/2018 12 0 - 44 U/L Final   Alkaline Phosphatase  Date Value Ref Range Status  05/30/2018 46 38 - 126 U/L Final   Total Bilirubin  Date Value Ref Range Status  05/30/2018 0.8 0.3 - 1.2 mg/dL Final   Hematology Recent Labs  Lab 05/29/18 1807 05/30/18 0249  WBC 9.4 10.6*  RBC 3.59* 3.46*  HGB 10.9* 10.7*  HCT 35.2* 32.4*  MCV 98.1 93.6  MCH 30.4 30.9  MCHC 31.0 33.0  RDW 14.2 14.2  PLT 161 162   Cardiac Enzymes Recent Labs  Lab 05/29/18 2123 05/30/18 0249  TROPONINI 0.04* 0.05*    Recent Labs  Lab 05/29/18 1815  TROPIPOC 0.03    BNP Recent Labs  Lab 05/29/18 1807  BNP  186.1*    DDimer No results for input(s): DDIMER in the last 168 hours. TSH:  Lab Results  Component Value Date   TSH 8.118 (H) 05/29/2018   Lipids:No results found for: CHOL, HDL, LDLCALC, LDLDIRECT, TRIG, CHOLHDL HgbA1c: Lab Results  Component Value Date   HGBA1C 5.7 (H) 05/30/2018    Radiology/Studies:  Dg Chest 2 View  Result Date: 05/29/2018 CLINICAL DATA:  Syncope. EXAM: CHEST - 2 VIEW COMPARISON:  Chest x-ray dated May 10, 2009. FINDINGS: Stable mild cardiomegaly. Normal pulmonary vascularity. No focal consolidation, pleural effusion, or pneumothorax. No acute osseous abnormality. IMPRESSION: Stable mild cardiomegaly.  No active cardiopulmonary disease. Electronically Signed   By: Titus Dubin M.D.   On: 05/29/2018 19:28    Assessment and Plan:   1.  Syncope mild elevation in troponin: -Presented to [facility] on 05/29/2018 s/p syncopal episode after working outside.  At the scene, he was found to be hypotensive and bradycardic.  EKG revealed sinus bradycardia with first-degree AV block and PVC.  Syncope in the setting of dehydration?  Versus bradycardia? -Telemetry review with no arrhythmias however, continues to be bradycardic in the 40s to 50s with first-degree AV block and occasional PVC -Atenolol currently on hold.  Patient was also on home doxazosin 2 mg, Lasix 80 mg, hydralazine 100 mg and amlodipine 10 mg -Troponin, only elevated at 0.04, 0.05 with no complaints  of chest pain or other ACS symptoms.  Elevation likely in the setting of severely elevated creatinine at 8.18.  He is currently followed by nephrology and primary care physician and he is not currently on hemodialysis.  -Echocardiogram, pending -Low suspicion for ACS.  Will continue to trend troponin, if continues to be elevated or has episodes of chest pain, will consider ischemic evaluation. -Continue to hold atenolol for bradycardia and AV block -Consider outpatient monitor at discharge  2.  Chronic kidney  disease stage V: -Followed by nephrology and primary care physician with known creatinine greater than 8.0 -Has AV graft in place however is not currently on hemodialysis -States he has an upcoming nephrology appointment next Tuesday and was last seen by his primary care physician approximately 2 weeks ago -Creatinine, 8.18 today -Nephrology consult?   3.  Essential hypertension: -Stable, 143/59, 149/66, 144/58 -On home amlodipine 10 mg, atenolol 100 mg, doxazosin 2 mg, Lasix 80 mg, hydralazine 100 mg -Beta-blocker on hold  4.  History of mitral prolapse per echocardiogram: -Per echocardiogram from 10/24/2015, LV EF 60 to 65% with G2 DD and mild mitral regurgitation -Per last office visit, asymptomatic -Echocardiogram ordered this admission  5.  Bradycardia: -Heart rate remains in the 40s to 50s, asymptomatic -No pauses or arrhythmias per telemetry review -Beta-blocker currently on hold  6.  History of grade 2 diastolic dysfunction: -No current CHF symptoms -On 80 mg Lasix daily for chronic LE swelling -Echocardiogram, pending -Last echocardiogram from 2017 with normal LV function and G2 DD with mild mitral regurgitation    For questions or updates, please contact [place] Please consult [email or web address] for contact info under Cardiology/STEMI.   Lyndel Safe NP-C [place] Pager: [phone] 05/30/2018 7:42 AM   Attending note:  Patient seen and examined.  I reviewed his records and discussed the case with Ms. Glenford Peers NP.  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  He felt like he was overheated and went to sit down on his truck, subsequently had a brief syncopal event and was reportedly found to be hypotensive on assessment at the scene as well as bradycardic.  He subsequently improved with observation and fluids.  He does not report any recurring chest pain at baseline, no  palpitations or prior history of sudden syncope.  He does have stage V CKD with left arm AV fistula in place, has not yet required hemodialysis, follows with Dr. Lorrene Reid.  Examination today he states that he feels at baseline, no symptoms.  Heart rate is in the 50s in sinus rhythm by telemetry which I personally reviewed.  He does have rare PVCs.  Systolic blood pressure is in the 140s to 150s.  Lungs exhibit decreased breath sounds without wheezing.  Cardiac exam reveals RRR with soft systolic murmur and no gallop.  Left arm fistula has palpable thrill.  Lab work shows potassium 3.2, BUN 76, creatinine 8.18, troponin I levels 0.04-0.06, hemoglobin 10.7, platelets 162.  I personally reviewed his ECG which shows sinus bradycardia with prolonged PR interval and PVC, also poor R wave progression, rule out old anterior infarct pattern.  Chest x-ray reports mild cardiomegaly with no edema.  Patient presents after syncopal episode that occurred after working in the heat, he was seated at the time.  Reportedly found to be hypotensive and bradycardic raising possibility of associated vasovagal event.  He does not report any preceding chest pain or palpitations.  Of note,  he does have CKD stage V with creatinine of 8.1 followed by nephrology, has not undergone hemodialysis as yet.  He has been on atenolol which is 50% excreted in the urine, agree with stopping this medication in light of his persistent bradycardia and evidence of conduction system disease.  Follow-up echocardiogram will also be obtained to assess cardiac structure and function.  If he does have a focal wall motion abnormality suggesting ischemic heart disease, further work-up can be considered.  Otherwise his troponin I levels are not suggestive of ACS as a contributor.  Depending on blood pressure trend, he may need an additional antihypertensive agent along with Norvasc that does not affect heart rate.  Hydralazine would be a consideration.  Satira Sark, M.D., F.A.C.C.

## 2018-05-30 NOTE — Plan of Care (Signed)
progressing 

## 2018-05-30 NOTE — Discharge Summary (Addendum)
Physician Discharge Summary  Peter [name] GHW:[id number] DOB: 1951/05/10 DOA: 05/29/2018  PCP: Gaynelle Arabian, MD  Admit date: 05/29/2018 Discharge date: 05/30/2018  Time spent: 65 minutes  Recommendations for Outpatient Follow-up:  1. Follow up with PCP 1-2 weeks evaluation for BP, volume status and thyroid function 2. Follow up with nephrology regarding kidney function. Has appt 9/3. Please address medications specifically lasix and cardura as these are being held at discharge   Discharge Diagnoses:  Principal Problem:   Syncope Active Problems:   Obstructive sleep apnea   Essential hypertension, benign   Morbid obesity ([place])   Diastolic dysfunction   OSA on CPAP   Elevated troponin   Elevated TSH   Discharge Condition: stable  Diet recommendation: heart healthy carb modified  Filed Weights   05/29/18 1734 05/30/18 0500  Weight: 117.9 kg 117.9 kg    History of present illness:  Peter [name] is a 67 y.o. 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 Presented with  Syncopal event. 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  It was hot outside so patient felt that he might be overheated. no prior fevers or chills no chest pain no prior history of syncope like this.  Not associated with  presyncope, no seizure. Hypotensive on the scene systolics 76 heart rate 78L but improved with IV fluids.  Blood pressure up to 136/76  Hospital Course:  Syncope and collapse.   -EKG discussed with cardiology they felt that the Wenckebach was not a reason for syncope. Received IV fluids and  Lasix held. No events on tele. Echo with EF 38% grade 2 diastolic dysfunction no wall motion abnormalities. Evaluated by cardiology who opined hypotension and bradycardia in patient with CKD V on atenolol and lasix likely responsible for syncope.  Recommended stopping atenolol and follow up with nephrology.  Elevated troponin -no chest pain no EKG abnormalities to suggest ischemia.  likely related to poor clearance from CKD   Elevated TSH - Free T4 0.89. T3 pending at discharge  Essential hypertension, fair control while in hospital. BB discontinued as noted above. Home meds include lasix as well  Morbid obesity ([place]) -Body mass index is 40.72 kg/m.  CKD-stage V. Creatinine 8.1 on admission. Follow up with nephrology on Sept 3 2019  Procedures:  echo  Consultations:  Dr Domenic Polite cardiology  Discharge Exam: Vitals:   05/30/18 1123 05/30/18 1436  BP: (!) 158/62 (!) 145/61  Pulse: (!) 55 (!) 55  Resp: 18 18  Temp: 98.8 F (37.1 C) 98.6 F (37 C)  SpO2: 95% 97%    General: well nourished alert oriented in no acute distress Cardiovascular: RRR +murmur 1+ LE edema Respiratory: normal effort BS distant but clear no wheeze  Discharge Instructions   Discharge Instructions    Discharge instructions   Complete by:  As directed    On 8/31: Cr: 8.18/ BUN: 76   Discharge instructions   Complete by:  As directed    Close renal follow up for adjustments of BP medications   Increase activity slowly   Complete by:  As directed      Allergies as of 05/30/2018      Reactions   Penicillins Other (See Comments)   UNSPECIFIED REACTION Childhood allergy Has patient had a PCN reaction causing immediate rash, facial/tongue/throat swelling, SOB or lightheadedness with hypotension: Unknown Has patient  had a PCN reaction causing severe rash involving mucus membranes or skin necrosis: No Has patient had a PCN reaction that required hospitalization No Has patient had a PCN reaction occurring within the last 10 years: No If all of the above answers are "NO", then may proceed with Cephalosporin use.      Medication List    STOP taking these medications   atenolol 100 MG tablet Commonly known as:  TENORMIN   doxazosin 2 MG  tablet Commonly known as:  CARDURA   furosemide 80 MG tablet Commonly known as:  LASIX   potassium chloride SA 20 MEQ tablet Commonly known as:  K-DUR,KLOR-CON     TAKE these medications   amLODipine 10 MG tablet Commonly known as:  NORVASC Take 10 mg by mouth daily.   atorvastatin 20 MG tablet Commonly known as:  LIPITOR Take 20 mg by mouth daily.   calcitRIOL 0.25 MCG capsule Commonly known as:  ROCALTROL Take 0.5 mcg by mouth 2 (two) times daily.   calcium carbonate 500 MG chewable tablet Commonly known as:  TUMS - dosed in mg elemental calcium Chew 1 tablet by mouth 3 (three) times daily with meals as needed for indigestion or heartburn.   hydrALAZINE 100 MG tablet Commonly known as:  APRESOLINE Take 100 mg by mouth every morning.      Allergies  Allergen Reactions  . Penicillins Other (See Comments)    UNSPECIFIED REACTION Childhood allergy Has patient had a PCN reaction causing immediate rash, facial/tongue/throat swelling, SOB or lightheadedness with hypotension: Unknown Has patient had a PCN reaction causing severe rash involving mucus membranes or skin necrosis: No Has patient had a PCN reaction that required hospitalization No Has patient had a PCN reaction occurring within the last 10 years: No If all of the above answers are "NO", then may proceed with Cephalosporin use.     Follow-up Information    Gaynelle Arabian, MD Follow up in 1 week(s).   Specialty:  Family Medicine Contact information: [address], [place] [address] [phone]        Jamal Maes, MD Follow up.   Specialty:  Nephrology Why:  follow up on Tuesday Contact information: [place] [address] [phone]            The results of significant diagnostics from this hospitalization (including imaging, microbiology, ancillary and laboratory) are listed below for reference.    Significant Diagnostic Studies: Dg Chest 2  View  Result Date: 05/29/2018 CLINICAL DATA:  Syncope. EXAM: CHEST - 2 VIEW COMPARISON:  Chest x-ray dated May 10, 2009. FINDINGS: Stable mild cardiomegaly. Normal pulmonary vascularity. No focal consolidation, pleural effusion, or pneumothorax. No acute osseous abnormality. IMPRESSION: Stable mild cardiomegaly.  No active cardiopulmonary disease. Electronically Signed   By: Titus Dubin M.D.   On: 05/29/2018 19:28    Microbiology: No results found for this or any previous visit (from the past 240 hour(s)).   Labs: Basic Metabolic Panel: Recent Labs  Lab 05/29/18 1807 05/29/18 2123 05/30/18 0249  NA 144  --  144  K 3.3*  --  3.2*  CL 108  --  106  CO2 22  --  24  GLUCOSE 80  --  145*  BUN 74*  --  76*  CREATININE 8.26*  --  8.18*  CALCIUM 10.3  --  9.7  MG  --  2.3  --    Liver Function Tests: Recent Labs  Lab 05/29/18 1807 05/30/18  0249  AST 12* 13*  ALT 12 12  ALKPHOS 46 46  BILITOT 0.7 0.8  PROT 6.9 6.7  ALBUMIN 3.5 3.3*   No results for input(s): LIPASE, AMYLASE in the last 168 hours. No results for input(s): AMMONIA in the last 168 hours. CBC: Recent Labs  Lab 05/29/18 1807 05/30/18 0249  WBC 9.4 10.6*  NEUTROABS 7.0  --   HGB 10.9* 10.7*  HCT 35.2* 32.4*  MCV 98.1 93.6  PLT 161 162   Cardiac Enzymes: Recent Labs  Lab 05/29/18 2123 05/30/18 0249 05/30/18 0836 05/30/18 1435  TROPONINI 0.04* 0.05* 0.06* 0.05*   BNP: BNP (last 3 results) Recent Labs    05/29/18 1807  BNP 186.1*    ProBNP (last 3 results) No results for input(s): PROBNP in the last 8760 hours.  CBG: Recent Labs  Lab 05/30/18 0317 05/30/18 0604 05/30/18 1150  GLUCAP 124* 79 89       Signed:  Geradine Girt DO  [facility] 05/30/2018, 4:13 PM

## 2018-05-30 NOTE — Progress Notes (Signed)
Discharge paperwork discussed with pt. Pt verbalized understanding of instructions and follow-up care. Pt A&O, ambulatory upon discharge.

## 2018-05-30 NOTE — Progress Notes (Signed)
VASCULAR LAB PRELIMINARY  PRELIMINARY  PRELIMINARY  PRELIMINARY  Carotid duplex completed.    Preliminary report:  1-39% ICA plaquing.  Vertebral artery flow is antegrade.   Kaylen Motl, RVT 05/30/2018, 9:57 AM

## 2018-05-30 NOTE — Progress Notes (Signed)
Pt transported to vascular.  °

## 2018-06-01 LAB — T3, FREE: T3, Free: 2.4 pg/mL (ref 2.0–4.4)

## 2018-06-02 ENCOUNTER — Other Ambulatory Visit: Payer: Self-pay

## 2018-06-02 DIAGNOSIS — N185 Chronic kidney disease, stage 5: Secondary | ICD-10-CM

## 2018-06-03 ENCOUNTER — Other Ambulatory Visit: Payer: Self-pay

## 2018-06-03 ENCOUNTER — Ambulatory Visit (INDEPENDENT_AMBULATORY_CARE_PROVIDER_SITE_OTHER)
Admission: RE | Admit: 2018-06-03 | Discharge: 2018-06-03 | Disposition: A | Discharge status: Home / Self Care | Payer: Medicare Other | Source: Ambulatory Visit | Attending: Vascular Surgery | Admitting: Vascular Surgery

## 2018-06-03 ENCOUNTER — Other Ambulatory Visit: Payer: Self-pay | Admitting: *Deleted

## 2018-06-03 ENCOUNTER — Ambulatory Visit (HOSPITAL_COMMUNITY)
Admission: RE | Admit: 2018-06-03 | Discharge: 2018-06-03 | Disposition: A | Discharge status: Home / Self Care | Payer: Medicare Other | Source: Ambulatory Visit | Attending: Vascular Surgery | Admitting: Vascular Surgery

## 2018-06-03 ENCOUNTER — Encounter: Payer: Self-pay | Admitting: Vascular Surgery

## 2018-06-03 ENCOUNTER — Ambulatory Visit (INDEPENDENT_AMBULATORY_CARE_PROVIDER_SITE_OTHER): Payer: Medicare Other | Admitting: Vascular Surgery

## 2018-06-03 ENCOUNTER — Encounter: Payer: Self-pay | Admitting: *Deleted

## 2018-06-03 VITALS — BP 148/69 | HR 43 | Temp 97.1°F | Resp 20 | Ht 67.0 in | Wt 245.0 lb

## 2018-06-03 DIAGNOSIS — I129 Hypertensive chronic kidney disease with stage 1 through stage 4 chronic kidney disease, or unspecified chronic kidney disease: Secondary | ICD-10-CM | POA: Diagnosis not present

## 2018-06-03 DIAGNOSIS — N185 Chronic kidney disease, stage 5: Secondary | ICD-10-CM | POA: Diagnosis not present

## 2018-06-03 DIAGNOSIS — N186 End stage renal disease: Secondary | ICD-10-CM

## 2018-06-03 DIAGNOSIS — Z992 Dependence on renal dialysis: Secondary | ICD-10-CM | POA: Diagnosis not present

## 2018-06-03 DIAGNOSIS — Z87891 Personal history of nicotine dependence: Secondary | ICD-10-CM | POA: Diagnosis not present

## 2018-06-03 MED ORDER — CEPHALEXIN 500 MG PO CAPS: 250.0000 mg | ORAL_CAPSULE | Freq: Three times a day (TID) | ORAL | 0 refills | Status: DC

## 2018-06-03 NOTE — Progress Notes (Signed)
Patient name: Peter [name] MRN: [id number] DOB: 10-07-1950 Sex: male  REASON FOR VISIT:   Needs tunneled dialysis catheter placed and new AV fistula.  The consult is requested by Dr. Lu Duffel.  HPI:   Peter [name] is a pleasant 67 y.o. male who I placed a left brachiocephalic fistula on on 7/49/4496.  He subsequently required superficialization of the AVF.  I have reviewed the records that were sent from [facility].  The patient has stage V chronic kidney disease.  The most recent office visit was 06/02/2018.  Most recent GFR was 8.3.  Looks like the patient had PTA of the AV fistula in May of this year and had multiple long stenoses.  The patient was seen last by [facility] the fistula was occluded.  Of note, the patient developed some swelling and redness over his left upper arm fistula which has been occluded about a week ago.  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  I am not sure the relationship between these events.  However this has been present for about a week.  He denies any recent uremic symptoms.  Specifically he denies nausea, vomiting, fatigue, anorexia, or palpitations.  He is not yet on dialysis.  Past Medical History:  Diagnosis Date  . Anemia in chronic kidney disease (CKD)   . CKD (chronic kidney disease) stage 5, GFR less than 15 ml/min (HCC)   . Diabetes mellitus without complication ([place]) 75/9163   type II  . ED (erectile dysfunction)   . Hypercholesteremia   . Hypertension   . Iron deficiency   . Kidney stone 2010  . 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 OSA (obstructive sleep apnea)    AHI 38/hr on CPAP at 12cm h2o  . 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  . Syncope     Family History  Problem Relation Age of Onset  . Dementia Mother   . Hypertension Father     SOCIAL HISTORY:  Social History    Tobacco Use  . Smoking status: Former Smoker    Types: Cigarettes    Last attempt to quit: 06/13/1986    Years since quitting: 31.9  . Smokeless tobacco: Former Systems developer    Types: Chew  Substance Use Topics  . Alcohol use: No    Allergies  Allergen Reactions  . Penicillins Other (See Comments)    UNSPECIFIED REACTION Childhood allergy Has patient had a PCN reaction causing immediate rash, facial/tongue/throat swelling, SOB or lightheadedness with hypotension: Unknown Has patient had a PCN reaction causing severe rash involving mucus membranes or skin necrosis: No Has patient had a PCN reaction that required hospitalization No Has patient had a PCN reaction occurring within the last 10 years: No If all of the above answers are "NO", then may proceed with Cephalosporin use.      Current Outpatient Medications  Medication Sig Dispense Refill  . amLODipine (NORVASC) 10 MG tablet Take 10 mg by mouth daily.    Marland Kitchen atorvastatin (LIPITOR) 20 MG tablet Take 20 mg by mouth daily.    . calcitRIOL (ROCALTROL) 0.25 MCG capsule Take 0.5 mcg by mouth 2 (two) times daily.     . calcium carbonate (TUMS - DOSED IN MG ELEMENTAL CALCIUM) 500 MG chewable tablet Chew 1 tablet by mouth 3 (three) times daily with meals as needed for  indigestion or heartburn.    . hydrALAZINE (APRESOLINE) 100 MG tablet Take 100 mg by mouth every morning.     . cephALEXin (KEFLEX) 500 MG capsule Take 1 capsule (500 mg total) by mouth 3 (three) times daily. 21 capsule 0   No current facility-administered medications for this visit.     REVIEW OF SYSTEMS:  [X]  denotes positive finding, [ ]  denotes negative finding Cardiac  Comments:  Chest pain or chest pressure:    Shortness of breath upon exertion:    Short of breath when lying flat:    Irregular heart rhythm:        Vascular    Pain in calf, thigh, or hip brought on by ambulation:    Pain in feet at night that wakes you up from your sleep:     Blood clot in your  veins:    Leg swelling:         Pulmonary    Oxygen at home:    Productive cough:     Wheezing:         Neurologic    Sudden weakness in arms or legs:     Sudden numbness in arms or legs:     Sudden onset of difficulty speaking or slurred speech:    Temporary loss of vision in one eye:     Problems with dizziness:         Gastrointestinal    Blood in stool:     Vomited blood:         Genitourinary    Burning when urinating:     Blood in urine:        Psychiatric    Major depression:         Hematologic    Bleeding problems:    Problems with blood clotting too easily:        Skin    Rashes or ulcers:        Constitutional    Fever or chills:     PHYSICAL EXAM:   Vitals:   06/03/18 0916  BP: (!) 148/69  Pulse: (!) 43  Resp: 20  Temp: (!) 97.1 F (36.2 C)  TempSrc: Oral  SpO2: 96%  Weight: 245 lb (111.1 kg)  Height: 5\' 7"  (1.702 m)   GENERAL: The patient is a well-nourished male, in no acute distress. The vital signs are documented above. CARDIAC: There is a regular rate and rhythm.  VASCULAR: I do not detect carotid bruits. He has palpable radial pulses bilaterally. He has no significant lower extremity swelling. PULMONARY: There is good air exchange bilaterally without wheezing or rales. ABDOMEN: Soft and non-tender with normal pitched bowel sounds.  MUSCULOSKELETAL: There are no major deformities or cyanosis. NEUROLOGIC: No focal weakness or paresthesias are detected. SKIN: There are no ulcers or rashes noted. PSYCHIATRIC: The patient has a normal affect.  DATA:    UPPER EXTREMITY ARTERIAL DUPLEX: I have independently interpreted the patient's upper extremity arterial duplex scan.  On the right side there is a triphasic radial and ulnar signal.  Brachial artery measures 0.65 cm in diameter.  On the left side there is a triphasic radial and ulnar signal.  The brachial artery measures 0.67 cm in diameter.  UPPER EXTREMITY VEIN MAP: I have  independently interpreted his upper extremity vein map.  On the left side his brachiocephalic fistula is occluded.  The basilic vein looks reasonable in size.  On the right side the forearm cephalic vein looks small in the  forearm.  It looks reasonable in the upper arm.  The basilic vein looks marginal in size.  CAROTID DUPLEX: I have reviewed the carotid duplex scan that was done on 05/30/2018.  The patient has a less than 39% carotid stenosis bilaterally.  MEDICAL ISSUES:   END-STAGE RENAL DISEASE: The patient needs a catheter and new access.  I would be reluctant to place a basilic vein transposition on the left given he has significant left arm swelling and some cellulitis currently.  I think his next best option for access would likely be a right upper arm fistula although I will also look at his cephalic vein in the forearm at the time of surgery.  Given he has cellulitis in the arm I putting him on Keflex and will schedule his surgery for next Tuesday.  We will plan on placing a catheter in the right brachiocephalic fistula most likely.  I have discussed the indications for the procedure and the potential complications with the patient and he is agreeable to proceed.  Deitra Mayo [facility] [phone]

## 2018-06-08 ENCOUNTER — Other Ambulatory Visit: Payer: Self-pay

## 2018-06-08 ENCOUNTER — Encounter (HOSPITAL_COMMUNITY): Payer: Self-pay | Admitting: *Deleted

## 2018-06-08 DIAGNOSIS — T829XXA Unspecified complication of cardiac and vascular prosthetic device, implant and graft, initial encounter: Secondary | ICD-10-CM | POA: Insufficient documentation

## 2018-06-08 DIAGNOSIS — D689 Coagulation defect, unspecified: Secondary | ICD-10-CM | POA: Insufficient documentation

## 2018-06-08 DIAGNOSIS — E1129 Type 2 diabetes mellitus with other diabetic kidney complication: Secondary | ICD-10-CM | POA: Insufficient documentation

## 2018-06-08 DIAGNOSIS — D509 Iron deficiency anemia, unspecified: Secondary | ICD-10-CM | POA: Insufficient documentation

## 2018-06-08 DIAGNOSIS — L299 Pruritus, unspecified: Secondary | ICD-10-CM | POA: Insufficient documentation

## 2018-06-08 DIAGNOSIS — N189 Chronic kidney disease, unspecified: Secondary | ICD-10-CM | POA: Insufficient documentation

## 2018-06-08 DIAGNOSIS — N2581 Secondary hyperparathyroidism of renal origin: Secondary | ICD-10-CM | POA: Insufficient documentation

## 2018-06-08 DIAGNOSIS — R0602 Shortness of breath: Secondary | ICD-10-CM | POA: Insufficient documentation

## 2018-06-08 DIAGNOSIS — I739 Peripheral vascular disease, unspecified: Secondary | ICD-10-CM | POA: Insufficient documentation

## 2018-06-08 MED ORDER — VANCOMYCIN HCL 10 G IV SOLR
1500.0000 mg | INTRAVENOUS | Status: AC
  Administered 2018-06-09: 1500 mg via INTRAVENOUS
  Filled 2018-06-08: qty 1500

## 2018-06-08 NOTE — Progress Notes (Signed)
Anesthesia Chart Review: SAME DAY WORK-UP  Case:  [id number] Date/Time:  06/09/18 1145   Procedures:      ARTERIOVENOUS (AV) FISTULA CREATION ARM (Right )     INSERTION OF DIALYSIS CATHETER (N/A )   Anesthesia type:  Monitor Anesthesia Care   Pre-op diagnosis:  chronic kidney disease   Location:  [facility] OR ROOM 11 / [place] OR   Surgeon:  Angelia Mould, MD      DISCUSSION: Patient is a 67 year old male scheduled for the above procedure. He is not currently on hemodialysis, but will begin "immediately after surgery" via tunneled hemodialysis catheter.   History includes former smoker (quit '87), MVP (with moderate MR 04/2018), hypercholesterolemia, DM2, OSA, renal oncocytoma (s/p partial left nephrectomy '10), HTN, anemia, CKD stage V (s/p left brachiocephalic AVF 2/95/62, occluded).  - Hospitalized 05/29/18-05/30/18 for syncope. 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 SBP on the scene was 76 with HR 50s. After IVF BP 136/76. Initial EKG showed question of Wenckebach, with following EKG showing SB with first degree AVB, occasional PVCs. . Cardiology (Dr. Domenic Polite) was consulted, but did not feel this contributed to his syncope. Atenolol was discontinued. Echo showed normal EF, grade 2 diastolic dysfunction, no wall motion abnormalities, moderate MR. Elevated troponin (0.03-0.04-0.05-006-0.05)felt likely due to poor clearance from CKD. Cardiology felt "hypotension and bradycardia in patient with CKD V on atenolol and lasix likely responsible for syncope" Creatinine 8.1. Follow-up with nephrology recommended. Could consider out-patient monitor.   Patient will get labs on arrival. If results acceptable and otherwise no acute changes then I would anticipate that he can proceed as planned.   PROVIDERSGaynelle Arabian, MD is PCP  Jamal Maes, MD is nephrologist Fransico Him, MD is  cardiologist (seen for OSA, last 09/09/16). Most recently seen by Rozann Lesches, MD during 05/29/18 hospitalization for syncope.   LABS: He is for labs on the day of surgery. Last H/H 10.7/32.4, Cr 8.18, BUN 76.  A1c 05/30/18 was 5.7.    IMAGES: CXR 05/29/18: IMPRESSION: Stable mild cardiomegaly.  No active cardiopulmonary disease.   EKG: 05/30/18: SB at 54 bpm, first degree AV block, occasional PVCs. Anterior infarct (age undetermined).    CV: Echo 05/30/18: Study Conclusions - Left ventricle: The cavity size was mildly dilated. There was   moderate concentric hypertrophy. Systolic function was normal.   The estimated ejection fraction was in the range of 60% to 65%.   Wall motion was normal; there were no regional wall motion   abnormalities. Features are consistent with a pseudonormal left   ventricular filling pattern, with concomitant abnormal relaxation   and increased filling pressure (grade 2 diastolic dysfunction). - Mitral valve: Mildly to moderately calcified annulus. Moderate   diffuse thickening, consistent with myxomatous proliferation.   Moderate, late systolicprolapse, involving the middle scallop of   the posterior leaflet. There was moderate regurgitation directed   eccentrically and anteriorly. - Left atrium: The atrium was moderately to severely dilated. - Right atrium: The atrium was mildly dilated. - Tricuspid valve: There was trivial regurgitation. Diastolic   regurgitation was present. Impressions: - Doppler parameters suggest moderate MR, but there is LV and LA   dilation. The highly eccentric jet could be underestimated.   Consider TEE.  Carotid U/S 05/30/18: Final Interpretation: - Right Carotid: Velocities in the right ICA are consistent with a 1-39% stenosis. -  Left Carotid: Velocities in the left ICA are consistent with a 1-39% stenosis. - Vertebrals: Bilateral vertebral arteries demonstrate antegrade flow. - Subclavians: Normal flow  hemodynamics were seen in bilateral subclavian arteries.   Past Medical History:  Diagnosis Date  . Anemia in chronic kidney disease (CKD)   . CKD (chronic kidney disease) stage 5, GFR less than 15 ml/min (HCC)   . Diabetes mellitus without complication ([place]) 99/3570   type II  . ED (erectile dysfunction)   . Hypercholesteremia   . Hypertension   . Iron deficiency   . Kidney stone 2010  . 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 OSA (obstructive sleep apnea)    AHI 38/hr on CPAP at 12cm h2o  . 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  . Syncope     Past Surgical History:  Procedure Laterality Date  . A/V FISTULAGRAM Left 02/10/2018   Procedure: A/V FISTULAGRAM;  Surgeon: Serafina Mitchell, MD;  Location: [place] [facility];  Service: Cardiovascular;  Laterality: Left;  . AV FISTULA PLACEMENT Left 06/14/2016   Procedure: ARTERIOVENOUS (AV) FISTULA CREATION LEFT ARM;  Surgeon: Angelia Mould, MD;  Location: [facility];  Service: Vascular;  Laterality: Left;  . COLONOSCOPY    . JOINT REPLACEMENT Left 2010   hip  . PARTIAL NEPHRECTOMY Left 2010  . PERIPHERAL VASCULAR INTERVENTION Left 02/10/2018   Procedure: PERIPHERAL VASCULAR INTERVENTION;  Surgeon: Serafina Mitchell, MD;  Location: [place] [facility];  Service: Cardiovascular;  Laterality: Left;  . TONSILLECTOMY      MEDICATIONS: No current facility-administered medications for this encounter.    Marland Kitchen amLODipine (NORVASC) 10 MG tablet  . atorvastatin (LIPITOR) 20 MG tablet  . calcitRIOL (ROCALTROL) 0.25 MCG capsule  . cephALEXin (KEFLEX) 500 MG capsule  . doxazosin (CARDURA) 2 MG tablet  . hydrALAZINE (APRESOLINE) 100 MG tablet  . potassium chloride SA (K-DUR,KLOR-CON) 20 MEQ tablet    [facility] Short Stay Center/Anesthesiology Phone [phone] 06/08/2018 10:21 AM

## 2018-06-08 NOTE — Progress Notes (Addendum)
Peter [name] denies chest pain , shortness of breath with exertion.  Patient has sleep apnea and wears CPAP every night. Peter [name] has been treated for diabetes in the past, but is no longer on medication. Patient rarely checks CBG. I instructed patient to check CBG after awaking and every 2 hours until arrival  to the hospital.  I Instructed patient if CBG is less than 70 to drink 1/2 cup of clear juice. Recheck CBG in 15 minutes then call pre- op desk at [phone] for further instructions.

## 2018-06-09 ENCOUNTER — Encounter (HOSPITAL_COMMUNITY): Payer: Self-pay | Admitting: Anesthesiology

## 2018-06-09 ENCOUNTER — Other Ambulatory Visit: Payer: Self-pay

## 2018-06-09 ENCOUNTER — Encounter (HOSPITAL_COMMUNITY)
Admission: RE | Disposition: A | Discharge status: Home / Self Care | Payer: Self-pay | Source: Ambulatory Visit | Attending: Vascular Surgery

## 2018-06-09 ENCOUNTER — Ambulatory Visit (HOSPITAL_COMMUNITY): Payer: Medicare Other | Admitting: Vascular Surgery

## 2018-06-09 ENCOUNTER — Ambulatory Visit (HOSPITAL_COMMUNITY): Payer: Medicare Other

## 2018-06-09 ENCOUNTER — Ambulatory Visit (HOSPITAL_COMMUNITY)
Admission: RE | Admit: 2018-06-09 | Discharge: 2018-06-09 | Disposition: A | Discharge status: Home / Self Care | Payer: Medicare Other | Source: Ambulatory Visit | Attending: Vascular Surgery | Admitting: Vascular Surgery

## 2018-06-09 ENCOUNTER — Telehealth: Payer: Self-pay | Admitting: Vascular Surgery

## 2018-06-09 DIAGNOSIS — E669 Obesity, unspecified: Secondary | ICD-10-CM | POA: Insufficient documentation

## 2018-06-09 DIAGNOSIS — Z992 Dependence on renal dialysis: Principal | ICD-10-CM

## 2018-06-09 DIAGNOSIS — Z6838 Body mass index (BMI) 38.0-38.9, adult: Secondary | ICD-10-CM | POA: Diagnosis not present

## 2018-06-09 DIAGNOSIS — Z87891 Personal history of nicotine dependence: Secondary | ICD-10-CM | POA: Insufficient documentation

## 2018-06-09 DIAGNOSIS — E7800 Pure hypercholesterolemia, unspecified: Secondary | ICD-10-CM | POA: Insufficient documentation

## 2018-06-09 DIAGNOSIS — G4733 Obstructive sleep apnea (adult) (pediatric): Secondary | ICD-10-CM | POA: Diagnosis not present

## 2018-06-09 DIAGNOSIS — I493 Ventricular premature depolarization: Secondary | ICD-10-CM | POA: Diagnosis not present

## 2018-06-09 DIAGNOSIS — E1122 Type 2 diabetes mellitus with diabetic chronic kidney disease: Secondary | ICD-10-CM | POA: Diagnosis not present

## 2018-06-09 DIAGNOSIS — N186 End stage renal disease: Secondary | ICD-10-CM

## 2018-06-09 DIAGNOSIS — D631 Anemia in chronic kidney disease: Secondary | ICD-10-CM | POA: Insufficient documentation

## 2018-06-09 DIAGNOSIS — I341 Nonrheumatic mitral (valve) prolapse: Secondary | ICD-10-CM | POA: Diagnosis not present

## 2018-06-09 DIAGNOSIS — I440 Atrioventricular block, first degree: Secondary | ICD-10-CM | POA: Diagnosis not present

## 2018-06-09 DIAGNOSIS — I120 Hypertensive chronic kidney disease with stage 5 chronic kidney disease or end stage renal disease: Secondary | ICD-10-CM | POA: Insufficient documentation

## 2018-06-09 DIAGNOSIS — Z905 Acquired absence of kidney: Secondary | ICD-10-CM | POA: Insufficient documentation

## 2018-06-09 DIAGNOSIS — N185 Chronic kidney disease, stage 5: Secondary | ICD-10-CM

## 2018-06-09 HISTORY — DX: Cardiac murmur, unspecified: R01.1

## 2018-06-09 HISTORY — DX: Personal history of urinary calculi: Z87.442

## 2018-06-09 HISTORY — PX: AV FISTULA PLACEMENT: SHX1204

## 2018-06-09 HISTORY — PX: INSERTION OF DIALYSIS CATHETER: SHX1324

## 2018-06-09 LAB — POCT I-STAT 4, (NA,K, GLUC, HGB,HCT)
Glucose, Bld: 97 mg/dL (ref 70–99)
HCT: 28 % — ABNORMAL LOW (ref 39.0–52.0)
Hemoglobin: 9.5 g/dL — ABNORMAL LOW (ref 13.0–17.0)
Potassium: 3.7 mmol/L (ref 3.5–5.1)
Sodium: 143 mmol/L (ref 135–145)

## 2018-06-09 LAB — GLUCOSE, CAPILLARY
Glucose-Capillary: 97 mg/dL (ref 70–99)
Glucose-Capillary: 97 mg/dL (ref 70–99)

## 2018-06-09 SURGERY — ARTERIOVENOUS (AV) FISTULA CREATION
Anesthesia: Monitor Anesthesia Care | Site: Neck | Laterality: Right

## 2018-06-09 MED ORDER — SODIUM CHLORIDE 0.9 % IV SOLN
INTRAVENOUS | Status: DC
  Administered 2018-06-09: 12:00:00 via INTRAVENOUS

## 2018-06-09 MED ORDER — HYDROCODONE-ACETAMINOPHEN 5-325 MG PO TABS: 1.0000 | ORAL_TABLET | Freq: Four times a day (QID) | ORAL | 0 refills | Status: DC | PRN

## 2018-06-09 MED ORDER — FENTANYL CITRATE (PF) 250 MCG/5ML IJ SOLN
INTRAMUSCULAR | Status: AC
  Filled 2018-06-09: qty 5

## 2018-06-09 MED ORDER — SODIUM CHLORIDE 0.9 % IV SOLN
INTRAVENOUS | Status: AC
  Filled 2018-06-09: qty 1.2

## 2018-06-09 MED ORDER — FENTANYL CITRATE (PF) 100 MCG/2ML IJ SOLN
INTRAMUSCULAR | Status: DC | PRN
  Administered 2018-06-09: 50 ug via INTRAVENOUS

## 2018-06-09 MED ORDER — LIDOCAINE-EPINEPHRINE (PF) 1 %-1:200000 IJ SOLN
INTRAMUSCULAR | Status: DC | PRN
  Administered 2018-06-09: 30 mL

## 2018-06-09 MED ORDER — 0.9 % SODIUM CHLORIDE (POUR BTL) OPTIME
TOPICAL | Status: DC | PRN
  Administered 2018-06-09: 1000 mL

## 2018-06-09 MED ORDER — PROPOFOL 10 MG/ML IV BOLUS
INTRAVENOUS | Status: DC | PRN
  Administered 2018-06-09: 20 mg via INTRAVENOUS

## 2018-06-09 MED ORDER — HEPARIN SODIUM (PORCINE) 1000 UNIT/ML IJ SOLN
INTRAMUSCULAR | Status: DC | PRN
  Administered 2018-06-09: 1000 [IU]

## 2018-06-09 MED ORDER — MIDAZOLAM HCL 2 MG/2ML IJ SOLN
INTRAMUSCULAR | Status: AC
  Filled 2018-06-09: qty 2

## 2018-06-09 MED ORDER — SODIUM CHLORIDE 0.9 % IV SOLN
INTRAVENOUS | Status: DC | PRN
  Administered 2018-06-09: 500 mL

## 2018-06-09 MED ORDER — ONDANSETRON HCL 4 MG/2ML IJ SOLN
INTRAMUSCULAR | Status: DC | PRN
  Administered 2018-06-09: 4 mg via INTRAVENOUS

## 2018-06-09 MED ORDER — HEPARIN SODIUM (PORCINE) 1000 UNIT/ML IJ SOLN
INTRAMUSCULAR | Status: AC
  Filled 2018-06-09: qty 1

## 2018-06-09 MED ORDER — EPHEDRINE 5 MG/ML INJ
INTRAVENOUS | Status: AC
  Filled 2018-06-09: qty 20

## 2018-06-09 MED ORDER — PROPOFOL 500 MG/50ML IV EMUL
INTRAVENOUS | Status: DC | PRN
  Administered 2018-06-09: 100 ug/kg/min via INTRAVENOUS

## 2018-06-09 MED ORDER — LIDOCAINE-EPINEPHRINE (PF) 1 %-1:200000 IJ SOLN
INTRAMUSCULAR | Status: AC
  Filled 2018-06-09: qty 30

## 2018-06-09 MED ORDER — ONDANSETRON HCL 4 MG/2ML IJ SOLN
INTRAMUSCULAR | Status: AC
  Filled 2018-06-09: qty 2

## 2018-06-09 MED ORDER — EPHEDRINE SULFATE-NACL 50-0.9 MG/10ML-% IV SOSY
PREFILLED_SYRINGE | INTRAVENOUS | Status: DC | PRN
  Administered 2018-06-09: 10 mg via INTRAVENOUS

## 2018-06-09 SURGICAL SUPPLY — 48 items
ARMBAND PINK RESTRICT EXTREMIT (MISCELLANEOUS) ×3 IMPLANT
BAG DECANTER FOR FLEXI CONT (MISCELLANEOUS) ×3 IMPLANT
BIOPATCH RED 1 DISK 7.0 (GAUZE/BANDAGES/DRESSINGS) ×3 IMPLANT
CANISTER SUCT 3000ML PPV (MISCELLANEOUS) ×3 IMPLANT
CATH PALINDROME RT-P 15FX19CM (CATHETERS) ×3 IMPLANT
CATH PALINDROME RT-P 15FX23CM (CATHETERS) IMPLANT
CATH PALINDROME RT-P 15FX28CM (CATHETERS) IMPLANT
CATH PALINDROME RT-P 15FX55CM (CATHETERS) IMPLANT
CLIP VESOCCLUDE MED 6/CT (CLIP) ×3 IMPLANT
CLIP VESOCCLUDE SM WIDE 6/CT (CLIP) ×6 IMPLANT
COVER PROBE W GEL 5X96 (DRAPES) ×3 IMPLANT
COVER SURGICAL LIGHT HANDLE (MISCELLANEOUS) ×3 IMPLANT
DERMABOND ADVANCED (GAUZE/BANDAGES/DRESSINGS) ×2
DERMABOND ADVANCED .7 DNX12 (GAUZE/BANDAGES/DRESSINGS) ×4 IMPLANT
DRAPE C-ARM 42X72 X-RAY (DRAPES) ×3 IMPLANT
DRAPE CHEST BREAST 15X10 FENES (DRAPES) ×3 IMPLANT
DRSG COVADERM 4X10 (GAUZE/BANDAGES/DRESSINGS) ×3 IMPLANT
ELECT REM PT RETURN 9FT ADLT (ELECTROSURGICAL) ×3
ELECTRODE REM PT RTRN 9FT ADLT (ELECTROSURGICAL) ×2 IMPLANT
GAUZE 4X4 16PLY RFD (DISPOSABLE) ×3 IMPLANT
GLOVE BIO SURGEON STRL SZ7.5 (GLOVE) ×3 IMPLANT
GOWN STRL REUS W/ TWL LRG LVL3 (GOWN DISPOSABLE) ×4 IMPLANT
GOWN STRL REUS W/ TWL XL LVL3 (GOWN DISPOSABLE) ×2 IMPLANT
GOWN STRL REUS W/TWL LRG LVL3 (GOWN DISPOSABLE) ×2
GOWN STRL REUS W/TWL XL LVL3 (GOWN DISPOSABLE) ×1
KIT BASIN OR (CUSTOM PROCEDURE TRAY) ×3 IMPLANT
KIT TURNOVER KIT B (KITS) ×3 IMPLANT
NEEDLE 18GX1X1/2 (RX/OR ONLY) (NEEDLE) ×3 IMPLANT
NEEDLE HYPO 25GX1X1/2 BEV (NEEDLE) ×3 IMPLANT
NS IRRIG 1000ML POUR BTL (IV SOLUTION) ×3 IMPLANT
PACK CV ACCESS (CUSTOM PROCEDURE TRAY) ×3 IMPLANT
PACK SURGICAL SETUP 50X90 (CUSTOM PROCEDURE TRAY) IMPLANT
PAD ARMBOARD 7.5X6 YLW CONV (MISCELLANEOUS) ×6 IMPLANT
SOAP 2 % CHG 4 OZ (WOUND CARE) ×3 IMPLANT
SUT ETHILON 3 0 PS 1 (SUTURE) ×3 IMPLANT
SUT MNCRL AB 4-0 PS2 18 (SUTURE) ×9 IMPLANT
SUT PROLENE 6 0 BV (SUTURE) ×3 IMPLANT
SUT VIC AB 3-0 SH 27 (SUTURE) ×1
SUT VIC AB 3-0 SH 27X BRD (SUTURE) ×2 IMPLANT
SYR 10ML LL (SYRINGE) ×3 IMPLANT
SYR 20CC LL (SYRINGE) ×6 IMPLANT
SYR 5ML LL (SYRINGE) ×3 IMPLANT
SYR CONTROL 10ML LL (SYRINGE) ×3 IMPLANT
TOWEL GREEN STERILE (TOWEL DISPOSABLE) ×3 IMPLANT
TOWEL GREEN STERILE FF (TOWEL DISPOSABLE) ×3 IMPLANT
TOWEL OR 17X26 4PK STRL BLUE (TOWEL DISPOSABLE) ×3 IMPLANT
UNDERPAD 30X30 (UNDERPADS AND DIAPERS) ×3 IMPLANT
WATER STERILE IRR 1000ML POUR (IV SOLUTION) ×3 IMPLANT

## 2018-06-09 NOTE — Discharge Instructions (Signed)
° °  [facility] ° °Discharge Instructions ° °AV Fistula or Graft Surgery for Dialysis Access ° °Please refer to the following instructions for your post-procedure care. Your surgeon or physician assistant will discuss any changes with you. ° °Activity ° °You may drive the day following your surgery, if you are comfortable and no longer taking prescription pain medication. Resume full activity as the soreness in your incision resolves. ° °Bathing/Showering ° °You may shower after you go home. Keep your incision dry for 48 hours. Do not soak in a bathtub, hot tub, or swim until the incision heals completely. You may not shower if you have a hemodialysis catheter. ° °Incision Care ° °Clean your incision with mild soap and water after 48 hours. Pat the area dry with a clean towel. You do not need a bandage unless otherwise instructed. Do not apply any ointments or creams to your incision. You may have skin glue on your incision. Do not peel it off. It will come off on its own in about one week. Your arm may swell a bit after surgery. To reduce swelling use pillows to elevate your arm so it is above your heart. Your doctor will tell you if you need to lightly wrap your arm with an ACE bandage. ° °Diet ° °Resume your normal diet. There are not special food restrictions following this procedure. In order to heal from your surgery, it is CRITICAL to get adequate nutrition. Your body requires vitamins, minerals, and protein. Vegetables are the best source of vitamins and minerals. Vegetables also provide the perfect balance of protein. Processed food has little nutritional value, so try to avoid this. ° °Medications ° °Resume taking all of your medications. If your incision is causing pain, you may take over-the counter pain relievers such as acetaminophen (Tylenol). If you were prescribed a stronger pain medication, please be aware these medications can cause nausea and constipation. Prevent  nausea by taking the medication with a snack or meal. Avoid constipation by drinking plenty of fluids and eating foods with high amount of fiber, such as fruits, vegetables, and grains. Do not take Tylenol if you are taking prescription pain medications. ° ° ° ° °Follow up °Your surgeon may want to see you in the office following your access surgery. If so, this will be arranged at the time of your surgery. ° °Please call us immediately for any of the following conditions: ° °Increased pain, redness, drainage (pus) from your incision site °Fever of 101 degrees or higher °Severe or worsening pain at your incision site °Hand pain or numbness. ° °Reduce your risk of vascular disease: ° °Stop smoking. If you would like help, call [facility] at 1-800-QUIT-NOW (1-800-784-8669) or [facility] at 336-586-4000 ° °Manage your cholesterol °Maintain a desired weight °Control your diabetes °Keep your blood pressure down ° °Dialysis ° °It will take several weeks to several months for your new dialysis access to be ready for use. Your surgeon will determine when it is OK to use it. Your nephrologist will continue to direct your dialysis. You can continue to use your Permcath until your new access is ready for use. ° °If you have any questions, please call the office at [phone]. ° °

## 2018-06-09 NOTE — H&P (Signed)
   History and Physical Update  The patient was interviewed and re-examined.  The patient's previous History and Physical has been reviewed and is unchanged from Dr. Scot Dock recent office visit. Plan for tunneled catheter placement and right arm av fistula.   Aleda Madl C. Donzetta Matters, MD [facility] Office: [phone] Pager: [phone]   06/09/2018, 11:50 AM

## 2018-06-09 NOTE — Op Note (Signed)
Patient name: Peter [name] MRN: [id number] DOB: 29-Nov-1950 Sex: male  06/09/2018 Pre-operative Diagnosis: esrd Post-operative diagnosis:  Same Surgeon:  Erlene Quan C. Donzetta Matters, MD Assistant: Arlee Muslim, PA Procedure Performed: 1.  Right IJ tunneled dialysis catheter placement with US guidance 2.  Right brachiocephalic av fistula creation  Indications: 67 year old male with history of chronic kidney disease has now progressed to requiring dialysis.  He has a previous left-sided AV fistula unfortunately has thrombosed just prior to needing use.  He is now indicated for right arm access and tunneled catheter for immediate dialysis.  Findings: Right IJ was patent and compressible 19 cm catheter was placed to the SVC innominate junction.  There is a large cephalic vein just below the antecubitum but it was somewhat sclerotic with apparent previous intravenous access.  The vein was also quite large in the proximal forearm but distally was very small.  There is a median cubital vein which was tied off to allow continued flow through the basilic vein this is needed in the future.  At completion the fistula has a strong signal and there is a radial artery signal at the wrist and augmented with compression of the fistula.    Fistula is somewhat deep and may require superficialization in the future.    Procedure:  The patient was identified in the holding area and taken to the operating room where is placed supine on the operating table and MAC anesthesia was induced he was prepped and draped in the right neck chest and arm in the usual fashion.  He was given antibiotics and a timeout was called.  We began with ultrasound-guided evaluation of the right internal jugular vein which was noted to be patent and compressible.  This was cannulated with 18-gauge needle and an J-wire was placed centrally.  A counterincision was made and a 19 cm catheter was tunneled.  Wire tract was serially dilated and introducer  sheath was placed under fluoroscopic guidance.  Catheter was then placed terminating at the SVC innominate junction.  It was assembled flushed and withdrew saline easily and one was locked with concentrated heparin 1.5 cc in either port.  It was affixed to skin with 3-0 nylon suture and the neck incision was closed for Monocryl and Dermabond was placed to both sides.  We then turned our attention of the right upper extremity where ultrasound was used to identify the cephalic vein with the above findings.  We could palpate the brachial pulse and a transverse incision was made between the 2 of these.  We dissected down to the vein there was a median cubital vein was marked for orientation.  The fascia was incised the brachial artery was identified and vessel loop placed around it.  The vein was then transected leaving the basilic vein in line flow.  This was dilated to 4 mm easily flushed with heparinized saline and clamped.  The artery was clamped distally proximally opened longitudinally flushed with heparinized saline both directions.  The vein was then sewn end-to-side with 6-0 Prolene suture.  Prior to completion anastomosis we allowed flushing all directions.  Upon completion there was a thrill in the vein however this could not be felt up on the arm because the fistula is likely too deep.  We could trace the fistula with Doppler.  There is a strong signal of the radial artery at the wrist with Doppler but did augment with compression of the fistula.  Satisfied with this we irrigated the wound obtained hemostasis  closed in layers with Vicryl and Monocryl.  Dermabond was placed to level the skin.  He was allowed awaken from anesthesia having tolerated procedure without immediate complication.  All counts were correct at completion.  EBL: 50cc    Soraida Vickers C. Donzetta Matters, MD [facility] Office: [phone] Pager: [phone]

## 2018-06-09 NOTE — Telephone Encounter (Signed)
sch appt lvm mld ltr 07/29/18 3pm Dialysis Duplex 4pm p/o MD

## 2018-06-09 NOTE — Anesthesia Preprocedure Evaluation (Addendum)
Anesthesia Evaluation  Patient identified by MRN, date of birth, ID band Patient awake    Reviewed: Allergy & Precautions, NPO status , Patient's Chart, lab work & pertinent test results  Airway Mallampati: III  TM Distance: >3 FB Neck ROM: Full    Dental  (+) Partial Upper, Edentulous Lower, Dental Advisory Given   Pulmonary sleep apnea , former smoker,     + decreased breath sounds      Cardiovascular hypertension, + Valvular Problems/Murmurs MVP  Rhythm:Regular Rate:Normal     Neuro/Psych negative neurological ROS  negative psych ROS   GI/Hepatic negative GI ROS, Neg liver ROS,   Endo/Other  diabetes  Renal/GU ESRFRenal disease     Musculoskeletal negative musculoskeletal ROS (+)   Abdominal (+) + obese,   Peds  Hematology   Anesthesia Other Findings   Reproductive/Obstetrics                            EKG: sinus bradycardia w/ PVC's.   Anesthesia Physical Anesthesia Plan  ASA: III  Anesthesia Plan: MAC   Post-op Pain Management:    Induction: Intravenous  PONV Risk Score and Plan: 2 and Propofol infusion and Ondansetron  Airway Management Planned: Simple Face Mask  Additional Equipment: None  Intra-op Plan:   Post-operative Plan:   Informed Consent: I have reviewed the patients History and Physical, chart, labs and discussed the procedure including the risks, benefits and alternatives for the proposed anesthesia with the patient or authorized representative who has indicated his/her understanding and acceptance.     Plan Discussed with: CRNA  Anesthesia Plan Comments:        Anesthesia Quick Evaluation

## 2018-06-09 NOTE — Transfer of Care (Signed)
Immediate Anesthesia Transfer of Care Note  Patient: Peter [name]  Procedure(s) Performed: CREATION Right arm Brachiocephalic (Right Arm Upper) INSERTION OF DIALYSIS CATHETER (N/A Neck)  Patient Location: PACU  Anesthesia Type:MAC  Level of Consciousness: awake, oriented and patient cooperative  Airway & Oxygen Therapy: Patient Spontanous Breathing  Post-op Assessment: Report given to RN, Post -op Vital signs reviewed and stable and Patient moving all extremities  Post vital signs: Reviewed and stable  Last Vitals:  Vitals Value Taken Time  BP    Temp    Pulse 54 06/09/2018  2:07 PM  Resp 12 06/09/2018  2:08 PM  SpO2 98 % 06/09/2018  2:07 PM  Vitals shown include unvalidated device data.  Last Pain:  Vitals:   06/09/18 1214  TempSrc: Oral  PainSc: 0-No pain      Patients Stated Pain Goal: 3 (12/82/08 1388)  Complications: No apparent anesthesia complications

## 2018-06-10 ENCOUNTER — Encounter (HOSPITAL_COMMUNITY): Payer: Self-pay | Admitting: Vascular Surgery

## 2018-06-10 NOTE — Anesthesia Postprocedure Evaluation (Signed)
Anesthesia Post Note  Patient: Peter [name]  Procedure(s) Performed: CREATION Right arm Brachiocephalic (Right Arm Upper) INSERTION OF DIALYSIS CATHETER (N/A Neck)     Patient location during evaluation: PACU Anesthesia Type: MAC Level of consciousness: awake and alert Pain management: pain level controlled Vital Signs Assessment: post-procedure vital signs reviewed and stable Respiratory status: spontaneous breathing, nonlabored ventilation, respiratory function stable and patient connected to nasal cannula oxygen Cardiovascular status: stable and blood pressure returned to baseline Postop Assessment: no apparent nausea or vomiting Anesthetic complications: no    Last Vitals:  Vitals:   06/09/18 1435 06/09/18 1455  BP: 133/61 (!) 130/59  Pulse: (!) 50 (!) 49  Resp: 14 15  Temp: 36.5 C   SpO2: 95% 98%    Last Pain:  Vitals:   06/09/18 1455  TempSrc:   PainSc: 0-No pain                 Effie Berkshire

## 2018-06-15 ENCOUNTER — Other Ambulatory Visit (HOSPITAL_COMMUNITY): Payer: Self-pay | Admitting: *Deleted

## 2018-06-16 ENCOUNTER — Inpatient Hospital Stay (HOSPITAL_COMMUNITY): Admission: RE | Admit: 2018-06-16 | Payer: Medicare Other | Source: Ambulatory Visit

## 2018-06-22 DIAGNOSIS — Z23 Encounter for immunization: Secondary | ICD-10-CM | POA: Insufficient documentation

## 2018-06-24 DIAGNOSIS — E46 Unspecified protein-calorie malnutrition: Secondary | ICD-10-CM | POA: Insufficient documentation

## 2018-06-25 DIAGNOSIS — E876 Hypokalemia: Secondary | ICD-10-CM | POA: Insufficient documentation

## 2018-07-14 ENCOUNTER — Encounter (HOSPITAL_COMMUNITY): Payer: Self-pay

## 2018-07-29 ENCOUNTER — Encounter (HOSPITAL_COMMUNITY): Payer: Self-pay

## 2018-07-29 ENCOUNTER — Encounter: Payer: Self-pay | Admitting: Vascular Surgery

## 2018-07-30 ENCOUNTER — Ambulatory Visit (HOSPITAL_COMMUNITY)
Admission: RE | Admit: 2018-07-30 | Discharge: 2018-07-30 | Disposition: A | Discharge status: Home / Self Care | Payer: Medicare Other | Source: Ambulatory Visit | Attending: Vascular Surgery | Admitting: Vascular Surgery

## 2018-07-30 DIAGNOSIS — Z992 Dependence on renal dialysis: Secondary | ICD-10-CM

## 2018-07-30 DIAGNOSIS — N186 End stage renal disease: Secondary | ICD-10-CM

## 2018-07-31 ENCOUNTER — Encounter: Payer: Self-pay | Admitting: Vascular Surgery

## 2018-07-31 ENCOUNTER — Encounter (HOSPITAL_COMMUNITY): Payer: Self-pay

## 2018-07-31 ENCOUNTER — Encounter: Payer: Self-pay | Admitting: Family

## 2018-07-31 ENCOUNTER — Other Ambulatory Visit: Payer: Self-pay

## 2018-07-31 ENCOUNTER — Ambulatory Visit (INDEPENDENT_AMBULATORY_CARE_PROVIDER_SITE_OTHER): Payer: Self-pay | Admitting: Family

## 2018-07-31 VITALS — BP 135/53 | HR 64 | Temp 97.6°F | Resp 18 | Ht 67.0 in | Wt 245.0 lb

## 2018-07-31 DIAGNOSIS — N186 End stage renal disease: Secondary | ICD-10-CM

## 2018-07-31 DIAGNOSIS — I770 Arteriovenous fistula, acquired: Secondary | ICD-10-CM

## 2018-07-31 DIAGNOSIS — Z992 Dependence on renal dialysis: Secondary | ICD-10-CM

## 2018-07-31 NOTE — Progress Notes (Signed)
CC: follow up AV fistula creation, ESRD  History of Present Illness  Peter [name] is a 67 y.o. (12/16/1950) male who is s/p Right IJ tunneled dialysis catheter placement and Right brachiocephalic av fistula creation on 06-09-18 by Dr. Donzetta Matters. Pt had a previous left-sided AV fistula which unfortunately had thrombosed just prior to needing use.  Dr. Donzetta Matters indicates in his operative note that fistula is somewhat deep and may require superficialization in the future.  Pt returns today for 6 weeks follow up.   He denies any steal symptoms in either upper extremity.  He dialyzes via [place].     Past Medical History:  Diagnosis Date  . Anemia in chronic kidney disease (CKD)   . CKD (chronic kidney disease) stage 5, GFR less than 15 ml/min (HCC)   . Diabetes mellitus without complication ([place]) 65/6812   type II  . ED (erectile dysfunction)   . Heart murmur    not significant  . History of kidney stones    passed 1 , 1 was removed with kidney disease in 2010  . Hypercholesteremia   . Hypertension   . Iron deficiency   . Kidney stone 2010  . 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 OSA (obstructive sleep apnea)    AHI 38/hr on CPAP at 12cm h2o  . 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  . Syncope     Social History Social History   Tobacco Use  . Smoking status: Former Smoker    Types: Cigarettes    Last attempt to quit: 06/13/1986    Years since quitting: 32.1  . Smokeless tobacco: Current User    Types: Chew  Substance Use Topics  . Alcohol use: No  . Drug use: No    Family History Family History  Problem Relation Age of Onset  . Dementia Mother   . Hypertension Father     Surgical History Past Surgical History:  Procedure Laterality Date  . A/V FISTULAGRAM Left 02/10/2018   Procedure: A/V FISTULAGRAM;  Surgeon: Serafina Mitchell, MD;  Location: [facility];  Service: Cardiovascular;  Laterality: Left;  . AV  FISTULA PLACEMENT Left 06/14/2016   Procedure: ARTERIOVENOUS (AV) FISTULA CREATION LEFT ARM;  Surgeon: Angelia Mould, MD;  Location: [employer] [facility];  Service: Vascular;  Laterality: Left;  . AV FISTULA PLACEMENT Right 06/09/2018   Procedure: CREATION Right arm Brachiocephalic;  Surgeon: Waynetta Sandy, MD;  Location: [facility];  Service: Vascular;  Laterality: Right;  . COLONOSCOPY    . INSERTION OF DIALYSIS CATHETER N/A 06/09/2018   Procedure: INSERTION OF DIALYSIS CATHETER;  Surgeon: Waynetta Sandy, MD;  Location: [facility];  Service: Vascular;  Laterality: N/A;  . JOINT REPLACEMENT Left 2010   hip  . PARTIAL NEPHRECTOMY Left 2010  . PERIPHERAL VASCULAR INTERVENTION Left 02/10/2018   Procedure: PERIPHERAL VASCULAR INTERVENTION;  Surgeon: Serafina Mitchell, MD;  Location: [facility];  Service: Cardiovascular;  Laterality: Left;  . TONSILLECTOMY      Allergies  Allergen Reactions  . Penicillins Other (See Comments)    UNSPECIFIED REACTION Childhood allergy Has patient had a PCN reaction causing immediate rash, facial/tongue/throat swelling, SOB or lightheadedness with hypotension: Unknown Has patient had a PCN reaction causing severe rash involving mucus membranes or skin necrosis: No Has patient had a PCN reaction that required hospitalization No Has patient had a PCN reaction occurring within the last  10 years: No If all of the above answers are "NO", then may proceed with Cephalosporin use.      Current Outpatient Medications  Medication Sig Dispense Refill  . amLODipine (NORVASC) 10 MG tablet Take 10 mg by mouth daily.    Marland Kitchen atorvastatin (LIPITOR) 20 MG tablet Take 20 mg by mouth daily.    . calcitRIOL (ROCALTROL) 0.25 MCG capsule Take 0.25 mcg by mouth at bedtime.     . cephALEXin (KEFLEX) 500 MG capsule Take 1 capsule (500 mg total) by mouth 3 (three) times daily. 21 capsule 0  . doxazosin (CARDURA) 2 MG tablet Take 2 mg by mouth daily.    . hydrALAZINE  (APRESOLINE) 100 MG tablet Take 100 mg by mouth every morning.     Marland Kitchen HYDROcodone-acetaminophen (NORCO) 5-325 MG tablet Take 1 tablet by mouth every 6 (six) hours as needed for moderate pain. 12 tablet 0  . potassium chloride SA (K-DUR,KLOR-CON) 20 MEQ tablet Take 40 mEq by mouth 2 (two) times daily.     No current facility-administered medications for this visit.      REVIEW OF SYSTEMS: see HPI for pertinent positives and negatives    PHYSICAL EXAMINATION:  Vitals:   07/31/18 0835  BP: (!) 135/53  Pulse: 64  Resp: 18  Temp: 97.6 F (36.4 C)  TempSrc: Oral  SpO2: 96%  Weight: 245 lb (111.1 kg)  Height: 5\' 7"  (1.702 m)   Body mass index is 38.37 kg/m.  General: Obese male appears his stated age.   HEENT:  No gross abnormalities Pulmonary: Respirations are non-labored Abdomen: Soft and non-tender Musculoskeletal: There are no major deformities.   Neurologic: No focal weakness or paresthesias are detected Skin: There are no ulcer or rashes noted. Psychiatric: The patient has normal affect. Cardiovascular: There is a regular rate and rhythm without significant murmur appreciated.    DATA Dialysis Access Duplex (07-31-18): Native artery inflow: 211 cm/s, 984 ml/min AVF anastomosis: 449 cm/s  Outflow vein: Shoulder: 123 cm/s, diameter: 0.59 cm, depth: 1.74 cm Prox UA: 103 cm/s, diameter: 0.54 cm, depth: 1.00 cm, competing branch Mid UA: 107 cm/s, diameter: 0.65 cm, depth: 0.98 cm Dist UA: 114 cm/s, diameter: 0.76 cm, depth: 0.54 cm AC fossa: 247 cm/s, diameter: 0.63 cm, depth: 0.49 cm   Medical Decision Making  Peter [name] is a 67 y.o. male who is  s/p Right IJ tunneled dialysis catheter placement and Right brachiocephalic av fistula creation on 06-09-18 by Dr. Donzetta Matters. Pt had a previous left-sided AV fistula which unfortunately had thrombosed just prior to needing use.  Dr. Donzetta Matters indicates in his operative note that fistula is somewhat deep and may require  superficialization in the future.  Pt returns today for 6 weeks follow up.   No steal sx's, no infection. Left arm possible cellulitis has resolved.  Pt dialyzes MWF via [place] at [address] in [facility].  Will have pt return at the beginning of December 2019 with another right arm dialysis duplex, see Dr. Donzetta Matters for evaluation of AVF.    Clemon Chambers, RN, MSN, FNP-C [facility] Office: [phone]  07/31/2018, 8:39 AM  Clinic MD: Early on call

## 2018-08-25 ENCOUNTER — Other Ambulatory Visit: Payer: Self-pay

## 2018-08-25 DIAGNOSIS — Z992 Dependence on renal dialysis: Principal | ICD-10-CM

## 2018-08-25 DIAGNOSIS — N186 End stage renal disease: Secondary | ICD-10-CM

## 2018-09-04 ENCOUNTER — Ambulatory Visit (HOSPITAL_COMMUNITY)
Admission: RE | Admit: 2018-09-04 | Discharge: 2018-09-04 | Disposition: A | Discharge status: Home / Self Care | Payer: Medicare Other | Source: Ambulatory Visit | Attending: Vascular Surgery | Admitting: Vascular Surgery

## 2018-09-04 ENCOUNTER — Other Ambulatory Visit: Payer: Self-pay | Admitting: *Deleted

## 2018-09-04 ENCOUNTER — Encounter: Payer: Self-pay | Admitting: *Deleted

## 2018-09-04 ENCOUNTER — Other Ambulatory Visit: Payer: Self-pay

## 2018-09-04 ENCOUNTER — Ambulatory Visit (INDEPENDENT_AMBULATORY_CARE_PROVIDER_SITE_OTHER): Payer: Self-pay | Admitting: Vascular Surgery

## 2018-09-04 ENCOUNTER — Encounter: Payer: Self-pay | Admitting: Vascular Surgery

## 2018-09-04 VITALS — BP 147/63 | HR 51 | Temp 97.0°F | Resp 16 | Ht 66.0 in | Wt 239.0 lb

## 2018-09-04 DIAGNOSIS — N186 End stage renal disease: Secondary | ICD-10-CM | POA: Diagnosis present

## 2018-09-04 DIAGNOSIS — Z992 Dependence on renal dialysis: Secondary | ICD-10-CM | POA: Diagnosis present

## 2018-09-04 NOTE — Progress Notes (Signed)
Patient ID: Peter [name], male   DOB: Jun 18, 1951, 67 y.o.   MRN: [id number]  Reason for Consult: Follow-up   Referred by Gaynelle Arabian, MD  Subjective:     HPI:  Peter [name] is a 67 y.o. male end-stage renal disease currently on dialysis via catheter.  He has a right upper extremity fistula that has not been ready for use given depth.  He presents today having no right upper extremity symptoms.  His only issue is that he cannot hunt given TDC in the right IJ.  Currently on dialysis Monday Wednesday Friday [place].  He does not take any blood thinners.  Past Medical History:  Diagnosis Date  . Anemia in chronic kidney disease (CKD)   . CKD (chronic kidney disease) stage 5, GFR less than 15 ml/min (HCC)   . Diabetes mellitus without complication ([place]) 35/3614   type II  . ED (erectile dysfunction)   . Heart murmur    not significant  . History of kidney stones    passed 1 , 1 was removed with kidney disease in 2010  . Hypercholesteremia   . Hypertension   . Iron deficiency   . Kidney stone 2010  . 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 OSA (obstructive sleep apnea)    AHI 38/hr on CPAP at 12cm h2o  . 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  . Syncope    Family History  Problem Relation Age of Onset  . Dementia Mother   . Hypertension Father    Past Surgical History:  Procedure Laterality Date  . A/V FISTULAGRAM Left 02/10/2018   Procedure: A/V FISTULAGRAM;  Surgeon: Serafina Mitchell, MD;  Location: [facility];  Service: Cardiovascular;  Laterality: Left;  . AV FISTULA PLACEMENT Left 06/14/2016   Procedure: ARTERIOVENOUS (AV) FISTULA CREATION LEFT ARM;  Surgeon: Angelia Mould, MD;  Location: [facility];  Service: Vascular;  Laterality: Left;  . AV FISTULA PLACEMENT Right 06/09/2018   Procedure: CREATION Right arm Brachiocephalic;  Surgeon: Waynetta Sandy, MD;  Location: [facility];  Service: Vascular;   Laterality: Right;  . COLONOSCOPY    . INSERTION OF DIALYSIS CATHETER N/A 06/09/2018   Procedure: INSERTION OF DIALYSIS CATHETER;  Surgeon: Waynetta Sandy, MD;  Location: [facility];  Service: Vascular;  Laterality: N/A;  . JOINT REPLACEMENT Left 2010   hip  . PARTIAL NEPHRECTOMY Left 2010  . PERIPHERAL VASCULAR INTERVENTION Left 02/10/2018   Procedure: PERIPHERAL VASCULAR INTERVENTION;  Surgeon: Serafina Mitchell, MD;  Location: [facility];  Service: Cardiovascular;  Laterality: Left;  . TONSILLECTOMY      Short Social History:  Social History   Tobacco Use  . Smoking status: Former Smoker    Types: Cigarettes    Last attempt to quit: 06/13/1986    Years since quitting: 32.2  . Smokeless tobacco: Current User    Types: Chew  Substance Use Topics  . Alcohol use: No    Allergies  Allergen Reactions  . Penicillins Other (See Comments)    UNSPECIFIED REACTION Childhood allergy Has patient had a PCN reaction causing immediate rash, facial/tongue/throat swelling, SOB or lightheadedness with hypotension: Unknown Has patient had a PCN reaction causing severe rash involving mucus membranes or skin necrosis: No Has patient had a PCN reaction that required hospitalization No Has patient had a PCN reaction occurring within the last 10 years: No If all of  the above answers are "NO", then may proceed with Cephalosporin use.      Current Outpatient Medications  Medication Sig Dispense Refill  . amLODipine (NORVASC) 10 MG tablet Take 10 mg by mouth daily.    Marland Kitchen atorvastatin (LIPITOR) 20 MG tablet Take 20 mg by mouth daily.    . calcitRIOL (ROCALTROL) 0.25 MCG capsule Take 0.25 mcg by mouth at bedtime.     Marland Kitchen doxazosin (CARDURA) 2 MG tablet Take 2 mg by mouth daily.    . hydrALAZINE (APRESOLINE) 100 MG tablet Take 100 mg by mouth every morning.     . potassium chloride SA (K-DUR,KLOR-CON) 20 MEQ tablet Take 40 mEq by mouth 2 (two) times daily.    . cephALEXin (KEFLEX) 500 MG  capsule Take 1 capsule (500 mg total) by mouth 3 (three) times daily. (Patient not taking: Reported on 09/04/2018) 21 capsule 0  . HYDROcodone-acetaminophen (NORCO) 5-325 MG tablet Take 1 tablet by mouth every 6 (six) hours as needed for moderate pain. (Patient not taking: Reported on 09/04/2018) 12 tablet 0   No current facility-administered medications for this visit.     Review of Systems  Constitutional:  Constitutional negative. HENT: HENT negative.  Eyes: Eyes negative.  GI: Gastrointestinal negative.  Neurological: Neurological negative. Hematologic: Hematologic/lymphatic negative.  Psychiatric: Psychiatric negative.        Objective:  Objective   Vitals:   09/04/18 0902  BP: (!) 147/63  Pulse: (!) 51  Resp: 16  Temp: (!) 97 F (36.1 C)  TempSrc: Oral  SpO2: 99%  Weight: 239 lb (108.4 kg)  Height: 5\' 6"  (1.676 m)   Body mass index is 38.58 kg/m.  Physical Exam  Constitutional: He is oriented to person, place, and time. He appears well-developed.  HENT:  Head: Normocephalic.  Neck:  Right IJ at catheter in place without hematoma or signs of infection  Cardiovascular:  Palpable thrill proximally in the antecubital fossa diminishes up the arm  Pulmonary/Chest: Effort normal.  Musculoskeletal: He exhibits no edema.  Neurological: He is alert and oriented to person, place, and time.  Skin: Skin is warm and dry.    Data: I have independently interpreted his dialysis duplex which demonstrates flow volume 619 mL/min with a diameter of the fistula range between 0.64 cm and 0.82 cm.  The depth in the upper arm gets as deep as 1.83 cm at the shoulder.  There are 2 competing branches identified in the upper arm     Assessment/Plan:     67 year old male with right upper extremity AV fistula that has failed to be accessed given what appears to be depth and some competing branches.  We will plan for transposition versus super fistulization on a nondialysis day in the near  future.  I discussed risk and benefits he agrees to proceed.     Waynetta Sandy MD [facility]

## 2018-09-08 ENCOUNTER — Encounter (HOSPITAL_COMMUNITY): Payer: Self-pay | Admitting: *Deleted

## 2018-09-08 NOTE — Progress Notes (Signed)
Spoke with pt for pre-op call. Pt states he has a heart murmur, but states it has never been a concern. Denies any other cardiac history. Pt is a type 2 Diabetic. No longer taking medications. Last A1C was 5.7 on 05/29/18. Pt doesn't check his blood sugar at home.

## 2018-09-09 MED ORDER — VANCOMYCIN HCL 10 G IV SOLR
1500.0000 mg | INTRAVENOUS | Status: AC
  Administered 2018-09-10: 1500 mg via INTRAVENOUS
  Filled 2018-09-09: qty 1500

## 2018-09-10 ENCOUNTER — Encounter (HOSPITAL_COMMUNITY)
Admission: RE | Disposition: A | Discharge status: Home / Self Care | Payer: Self-pay | Source: Home / Self Care | Attending: Vascular Surgery

## 2018-09-10 ENCOUNTER — Ambulatory Visit (HOSPITAL_COMMUNITY): Payer: Medicare Other | Admitting: Anesthesiology

## 2018-09-10 ENCOUNTER — Other Ambulatory Visit: Payer: Self-pay

## 2018-09-10 ENCOUNTER — Telehealth: Payer: Self-pay | Admitting: Vascular Surgery

## 2018-09-10 ENCOUNTER — Ambulatory Visit (HOSPITAL_COMMUNITY)
Admission: RE | Admit: 2018-09-10 | Discharge: 2018-09-10 | Disposition: A | Discharge status: Home / Self Care | Payer: Medicare Other | Attending: Vascular Surgery | Admitting: Vascular Surgery

## 2018-09-10 ENCOUNTER — Encounter (HOSPITAL_COMMUNITY): Payer: Self-pay

## 2018-09-10 DIAGNOSIS — Z79899 Other long term (current) drug therapy: Secondary | ICD-10-CM | POA: Diagnosis not present

## 2018-09-10 DIAGNOSIS — Z87891 Personal history of nicotine dependence: Secondary | ICD-10-CM | POA: Insufficient documentation

## 2018-09-10 DIAGNOSIS — G4733 Obstructive sleep apnea (adult) (pediatric): Secondary | ICD-10-CM | POA: Diagnosis not present

## 2018-09-10 DIAGNOSIS — Z992 Dependence on renal dialysis: Secondary | ICD-10-CM | POA: Insufficient documentation

## 2018-09-10 DIAGNOSIS — I120 Hypertensive chronic kidney disease with stage 5 chronic kidney disease or end stage renal disease: Secondary | ICD-10-CM | POA: Diagnosis not present

## 2018-09-10 DIAGNOSIS — Z880 Allergy status to penicillin: Secondary | ICD-10-CM | POA: Insufficient documentation

## 2018-09-10 DIAGNOSIS — E1122 Type 2 diabetes mellitus with diabetic chronic kidney disease: Secondary | ICD-10-CM | POA: Insufficient documentation

## 2018-09-10 DIAGNOSIS — N186 End stage renal disease: Secondary | ICD-10-CM | POA: Insufficient documentation

## 2018-09-10 DIAGNOSIS — Z96642 Presence of left artificial hip joint: Secondary | ICD-10-CM | POA: Insufficient documentation

## 2018-09-10 DIAGNOSIS — E785 Hyperlipidemia, unspecified: Secondary | ICD-10-CM | POA: Insufficient documentation

## 2018-09-10 DIAGNOSIS — T82898A Other specified complication of vascular prosthetic devices, implants and grafts, initial encounter: Secondary | ICD-10-CM | POA: Diagnosis not present

## 2018-09-10 DIAGNOSIS — Y832 Surgical operation with anastomosis, bypass or graft as the cause of abnormal reaction of the patient, or of later complication, without mention of misadventure at the time of the procedure: Secondary | ICD-10-CM | POA: Diagnosis not present

## 2018-09-10 HISTORY — PX: BASCILIC VEIN TRANSPOSITION: SHX5742

## 2018-09-10 LAB — POCT I-STAT 4, (NA,K, GLUC, HGB,HCT)
Glucose, Bld: 89 mg/dL (ref 70–99)
HCT: 29 % — ABNORMAL LOW (ref 39.0–52.0)
Hemoglobin: 9.9 g/dL — ABNORMAL LOW (ref 13.0–17.0)
Potassium: 3.7 mmol/L (ref 3.5–5.1)
Sodium: 140 mmol/L (ref 135–145)

## 2018-09-10 LAB — GLUCOSE, CAPILLARY
Glucose-Capillary: 83 mg/dL (ref 70–99)
Glucose-Capillary: 74 mg/dL (ref 70–99)

## 2018-09-10 SURGERY — TRANSPOSITION, VEIN, BASILIC
Anesthesia: General | Site: Arm Upper | Laterality: Right

## 2018-09-10 MED ORDER — PROPOFOL 1000 MG/100ML IV EMUL
INTRAVENOUS | Status: AC
  Filled 2018-09-10: qty 100

## 2018-09-10 MED ORDER — ONDANSETRON HCL 4 MG/2ML IJ SOLN
INTRAMUSCULAR | Status: AC
  Filled 2018-09-10: qty 2

## 2018-09-10 MED ORDER — LIDOCAINE HCL 1 % IJ SOLN
INTRAMUSCULAR | Status: DC | PRN
  Administered 2018-09-10: 20 mL

## 2018-09-10 MED ORDER — PROPOFOL 10 MG/ML IV BOLUS
INTRAVENOUS | Status: DC | PRN
  Administered 2018-09-10: 20 mg via INTRAVENOUS

## 2018-09-10 MED ORDER — SODIUM CHLORIDE 0.9 % IV SOLN
INTRAVENOUS | Status: DC | PRN
  Administered 2018-09-10: 07:00:00 via INTRAVENOUS

## 2018-09-10 MED ORDER — SODIUM CHLORIDE 0.9 % IV SOLN: INTRAVENOUS | Status: DC

## 2018-09-10 MED ORDER — PROPOFOL 500 MG/50ML IV EMUL
INTRAVENOUS | Status: DC | PRN
  Administered 2018-09-10: 75 ug/kg/min via INTRAVENOUS

## 2018-09-10 MED ORDER — ONDANSETRON HCL 4 MG/2ML IJ SOLN
INTRAMUSCULAR | Status: DC | PRN
  Administered 2018-09-10: 4 mg via INTRAVENOUS

## 2018-09-10 MED ORDER — HEMOSTATIC AGENTS (NO CHARGE) OPTIME
TOPICAL | Status: DC | PRN
  Administered 2018-09-10: 1 via TOPICAL

## 2018-09-10 MED ORDER — FENTANYL CITRATE (PF) 100 MCG/2ML IJ SOLN
INTRAMUSCULAR | Status: DC | PRN
  Administered 2018-09-10: 50 ug via INTRAVENOUS
  Administered 2018-09-10: 25 ug via INTRAVENOUS

## 2018-09-10 MED ORDER — SODIUM CHLORIDE 0.9 % IV SOLN
INTRAVENOUS | Status: AC
  Filled 2018-09-10: qty 1.2

## 2018-09-10 MED ORDER — LIDOCAINE HCL (PF) 1 % IJ SOLN
INTRAMUSCULAR | Status: AC
  Filled 2018-09-10: qty 90

## 2018-09-10 MED ORDER — LIDOCAINE-EPINEPHRINE (PF) 1 %-1:200000 IJ SOLN
INTRAMUSCULAR | Status: AC
  Filled 2018-09-10: qty 30

## 2018-09-10 MED ORDER — SODIUM CHLORIDE 0.9 % IV SOLN
INTRAVENOUS | Status: DC | PRN
  Administered 2018-09-10: 500 mL

## 2018-09-10 MED ORDER — PHENYLEPHRINE 40 MCG/ML (10ML) SYRINGE FOR IV PUSH (FOR BLOOD PRESSURE SUPPORT)
PREFILLED_SYRINGE | INTRAVENOUS | Status: AC
  Filled 2018-09-10: qty 10

## 2018-09-10 MED ORDER — 0.9 % SODIUM CHLORIDE (POUR BTL) OPTIME
TOPICAL | Status: DC | PRN
  Administered 2018-09-10: 1000 mL

## 2018-09-10 MED ORDER — PROPOFOL 10 MG/ML IV BOLUS
INTRAVENOUS | Status: AC
  Filled 2018-09-10: qty 20

## 2018-09-10 MED ORDER — OXYCODONE-ACETAMINOPHEN 5-325 MG PO TABS: 1.0000 | ORAL_TABLET | Freq: Four times a day (QID) | ORAL | 0 refills | Status: DC | PRN

## 2018-09-10 MED ORDER — FENTANYL CITRATE (PF) 250 MCG/5ML IJ SOLN
INTRAMUSCULAR | Status: AC
  Filled 2018-09-10: qty 5

## 2018-09-10 SURGICAL SUPPLY — 29 items
ARMBAND PINK RESTRICT EXTREMIT (MISCELLANEOUS) ×4 IMPLANT
CANISTER SUCT 3000ML PPV (MISCELLANEOUS) ×4 IMPLANT
CLIP VESOCCLUDE MED 6/CT (CLIP) ×4 IMPLANT
CLIP VESOCCLUDE SM WIDE 6/CT (CLIP) ×12 IMPLANT
COVER PROBE W GEL 5X96 (DRAPES) ×4 IMPLANT
COVER WAND RF STERILE (DRAPES) ×4 IMPLANT
DERMABOND ADVANCED (GAUZE/BANDAGES/DRESSINGS) ×2
DERMABOND ADVANCED .7 DNX12 (GAUZE/BANDAGES/DRESSINGS) ×2 IMPLANT
ELECT REM PT RETURN 9FT ADLT (ELECTROSURGICAL) ×4
ELECTRODE REM PT RTRN 9FT ADLT (ELECTROSURGICAL) ×2 IMPLANT
GLOVE BIO SURGEON STRL SZ7.5 (GLOVE) ×4 IMPLANT
GOWN STRL REUS W/ TWL LRG LVL3 (GOWN DISPOSABLE) ×4 IMPLANT
GOWN STRL REUS W/ TWL XL LVL3 (GOWN DISPOSABLE) ×2 IMPLANT
GOWN STRL REUS W/TWL LRG LVL3 (GOWN DISPOSABLE) ×4
GOWN STRL REUS W/TWL XL LVL3 (GOWN DISPOSABLE) ×2
HEMOSTAT SNOW SURGICEL 2X4 (HEMOSTASIS) ×4 IMPLANT
KIT BASIN OR (CUSTOM PROCEDURE TRAY) ×4 IMPLANT
KIT TURNOVER KIT B (KITS) ×4 IMPLANT
NS IRRIG 1000ML POUR BTL (IV SOLUTION) ×4 IMPLANT
PACK CV ACCESS (CUSTOM PROCEDURE TRAY) ×4 IMPLANT
PAD ARMBOARD 7.5X6 YLW CONV (MISCELLANEOUS) ×8 IMPLANT
SUT MNCRL AB 4-0 PS2 18 (SUTURE) ×8 IMPLANT
SUT PROLENE 6 0 BV (SUTURE) ×12 IMPLANT
SUT SILK 2 0 SH (SUTURE) ×4 IMPLANT
SUT VIC AB 3-0 SH 27 (SUTURE) ×4
SUT VIC AB 3-0 SH 27X BRD (SUTURE) ×4 IMPLANT
TOWEL GREEN STERILE (TOWEL DISPOSABLE) ×4 IMPLANT
UNDERPAD 30X30 (UNDERPADS AND DIAPERS) ×4 IMPLANT
WATER STERILE IRR 1000ML POUR (IV SOLUTION) ×4 IMPLANT

## 2018-09-10 NOTE — Telephone Encounter (Signed)
sch appt spk to pt mld ltr 10/08/2017 2pm Dialysis Duplex 3pm p/o PA

## 2018-09-10 NOTE — Transfer of Care (Signed)
Immediate Anesthesia Transfer of Care Note  Patient: Peter [name]  Procedure(s) Performed: CEPHALIC VEIN TRANSPOSITION (Right Arm Upper)  Patient Location: PACU  Anesthesia Type:MAC  Level of Consciousness: awake, alert , oriented and patient cooperative  Airway & Oxygen Therapy: Patient Spontanous Breathing  Post-op Assessment: Report given to RN and Post -op Vital signs reviewed and stable  Post vital signs: Reviewed and stable  Last Vitals:  Vitals Value Taken Time  BP    Temp    Pulse    Resp    SpO2      Last Pain:  Vitals:   09/10/18 0647  TempSrc:   PainSc: 0-No pain         Complications: No apparent anesthesia complications

## 2018-09-10 NOTE — Discharge Instructions (Signed)
[facility]  Discharge Instructions  AV Fistula or Graft Surgery for Dialysis Access  Please refer to the following instructions for your post-procedure care. Your surgeon or physician assistant will discuss any changes with you.  Activity  You may drive the day following your surgery, if you are comfortable and no longer taking prescription pain medication. Resume full activity as the soreness in your incision resolves.  Bathing/Showering  You may shower after you go home. Keep your incision dry for 48 hours. Do not soak in a bathtub, hot tub, or swim until the incision heals completely. You may not shower if you have a hemodialysis catheter.  Incision Care  Clean your incision with mild soap and water after 48 hours. Pat the area dry with a clean towel. You do not need a bandage unless otherwise instructed. Do not apply any ointments or creams to your incision. You may have skin glue on your incision. Do not peel it off. It will come off on its own in about one week. Your arm may swell a bit after surgery. To reduce swelling use pillows to elevate your arm so it is above your heart. Your doctor will tell you if you need to lightly wrap your arm with an ACE bandage.  Diet  Resume your normal diet. There are not special food restrictions following this procedure. In order to heal from your surgery, it is CRITICAL to get adequate nutrition. Your body requires vitamins, minerals, and protein. Vegetables are the best source of vitamins and minerals. Vegetables also provide the perfect balance of protein. Processed food has little nutritional value, so try to avoid this.  Medications  Resume taking all of your medications. If your incision is causing pain, you may take over-the counter pain relievers such as acetaminophen (Tylenol). If you were prescribed a stronger pain medication, please be aware these medications can cause nausea and constipation. Prevent  nausea by taking the medication with a snack or meal. Avoid constipation by drinking plenty of fluids and eating foods with high amount of fiber, such as fruits, vegetables, and grains.  Do not take Tylenol if you are taking prescription pain medications.  Follow up Your surgeon may want to see you in the office following your access surgery. If so, this will be arranged at the time of your surgery.  Please call us immediately for any of the following conditions:  Increased pain, redness, drainage (pus) from your incision site Fever of 101 degrees or higher Severe or worsening pain at your incision site Hand pain or numbness.  Reduce your risk of vascular disease:  Stop smoking. If you would like help, call [facility] at 1-800-QUIT-NOW 203-723-0818) or [name] at [place] your cholesterol Maintain a desired weight Control your diabetes Keep your blood pressure down  Dialysis  It will take several weeks to several months for your new dialysis access to be ready for use. Your surgeon will determine when it is okay to use it. Your nephrologist will continue to direct your dialysis. You can continue to use your Permcath until your new access is ready for use.   09/10/2018 Peter [name] [id number] 1951/06/12  Surgeon(s): Waynetta Sandy, MD  Procedure(s): CEPHALIC VEIN TRANSPOSITION RIGHT ARM  x Do not stick fistula for 4 weeks    If you have any questions, please call the office at [phone].   Post Anesthesia Home Care Instructions  Activity: Get plenty of rest for the remainder of  the day. A responsible individual must stay with you for 24 hours following the procedure.  For the next 24 hours, DO NOT: -Drive a car -Paediatric nurse -Drink alcoholic beverages -Take any medication unless instructed by your physician -Make any legal decisions or sign important papers.  Meals: Start with liquid foods such as gelatin or soup.  Progress to regular foods as tolerated. Avoid greasy, spicy, heavy foods. If nausea and/or vomiting occur, drink only clear liquids until the nausea and/or vomiting subsides. Call your physician if vomiting continues.  Special Instructions/Symptoms: Your throat may feel dry or sore from the anesthesia or the breathing tube placed in your throat during surgery. If this causes discomfort, gargle with warm salt water. The discomfort should disappear within 24 hours.  If you had a scopolamine patch placed behind your ear for the management of post- operative nausea and/or vomiting:  1. The medication in the patch is effective for 72 hours, after which it should be removed.  Wrap patch in a tissue and discard in the trash. Wash hands thoroughly with soap and water. 2. You may remove the patch earlier than 72 hours if you experience unpleasant side effects which may include dry mouth, dizziness or visual disturbances. 3. Avoid touching the patch. Wash your hands with soap and water after contact with the patch.

## 2018-09-10 NOTE — Anesthesia Preprocedure Evaluation (Addendum)
Anesthesia Evaluation  Patient identified by MRN, date of birth, ID band Patient awake    Reviewed: Allergy & Precautions, NPO status , Patient's Chart, lab work & pertinent test results  Airway Mallampati: III  TM Distance: >3 FB Neck ROM: Full    Dental  (+) Dental Advisory Given, Partial Upper   Pulmonary sleep apnea and Continuous Positive Airway Pressure Ventilation , former smoker,    breath sounds clear to auscultation       Cardiovascular hypertension, Pt. on medications and Pt. on home beta blockers (-) angina+ Valvular Problems/Murmurs MVP  Rhythm:Regular Rate:Normal  ECG: SB, rate 60  ECHO: LV EF: 60% -   65%   Neuro/Psych negative neurological ROS  negative psych ROS   GI/Hepatic negative GI ROS, Neg liver ROS,   Endo/Other  diabetes (diet controlled), Well Controlled, Type 2  Renal/GU ESRF and DialysisRenal diseaseOn HD M, W, F     Musculoskeletal negative musculoskeletal ROS (+)   Abdominal (+) + obese,   Peds  Hematology HLD   Anesthesia Other Findings end stage renal disease  Reproductive/Obstetrics                            Anesthesia Physical Anesthesia Plan  ASA: IV  Anesthesia Plan: MAC   Post-op Pain Management:    Induction: Intravenous  PONV Risk Score and Plan: 1 and Propofol infusion and Treatment may vary due to age or medical condition  Airway Management Planned: Simple Face Mask  Additional Equipment: None  Intra-op Plan:   Post-operative Plan:   Informed Consent: I have reviewed the patients History and Physical, chart, labs and discussed the procedure including the risks, benefits and alternatives for the proposed anesthesia with the patient or authorized representative who has indicated his/her understanding and acceptance.   Dental advisory given  Plan Discussed with: CRNA and Anesthesiologist  Anesthesia Plan Comments:       Anesthesia  Quick Evaluation

## 2018-09-10 NOTE — H&P (Signed)
   History and Physical Update  The patient was interviewed and re-examined.  The patient's previous History and Physical has been reviewed and is unchanged from previous. Plan for right arm avf superficialization vs transposition.   Brandon C. Donzetta Matters, MD [facility] [place] Office: [phone] Pager: [phone]   09/10/2018, 7:31 AM

## 2018-09-10 NOTE — Telephone Encounter (Signed)
-----   Message from Mena Goes, RN sent at 09/10/2018  9:12 AM EST ----- Regarding: 3-4 weeks postop  ----- Message ----- From: Gabriel Earing, PA-C Sent: 09/10/2018   9:02 AM EST To: [facility] Admin Pool, [facility] Charge Pool  S/p transposition of LUA AVF 09/10/18.  F/u in [facility] clinic in a couple of weeks with duplex on Dr. Claretha Cooper clinic day.  Thanks.

## 2018-09-10 NOTE — Anesthesia Postprocedure Evaluation (Signed)
Anesthesia Post Note  Patient: Peter [name]  Procedure(s) Performed: CEPHALIC VEIN TRANSPOSITION (Right Arm Upper)     Patient location during evaluation: PACU Anesthesia Type: MAC Level of consciousness: awake and alert Pain management: pain level controlled Vital Signs Assessment: post-procedure vital signs reviewed and stable Respiratory status: spontaneous breathing, nonlabored ventilation, respiratory function stable and patient connected to nasal cannula oxygen Cardiovascular status: stable and blood pressure returned to baseline Postop Assessment: no apparent nausea or vomiting Anesthetic complications: no    Last Vitals:  Vitals:   09/10/18 1037 09/10/18 1102  BP:  134/63  Pulse: (!) 55 (!) 50  Resp: 12 16  Temp: (!) 36.3 C   SpO2: 99% 99%    Last Pain:  Vitals:   09/10/18 1102  TempSrc:   PainSc: 0-No pain                 Ryan P Ellender

## 2018-09-10 NOTE — Op Note (Signed)
    Patient name: Peter [name] MRN: [id number] DOB: 03-25-1951 Sex: male  09/10/2018 Pre-operative Diagnosis: esrd Post-operative diagnosis:  Same Surgeon:  Erlene Quan C. Donzetta Matters, MD Assistant: Leontine Locket, PA Procedure Performed: Transposition of right arm arteriovenous fistula  Indications: 67 year old male on dialysis via a right IJ catheter.  He has a right upper arm AV fistula that has matured but appears to be too deep for cannulation.  He is indicated for transposition versus superficialization  Findings: Fistula measured approximately 5 mm external diameter throughout.  It was transposed laterally.  At completion had a strong thrill there is a palpable radial pulse in the wrist   Procedure:  The patient was identified in the holding area and taken to the operating room where is placed upon the operating table and MAC anesthesia was induced.  He was sterilely prepped and draped in the right upper extremity usual fashion antibiotics were administered and a timeout was called.  We injected 1% lidocaine with epinephrine to a total of 20 cc.  I then made an incision above the antecubital overlying the fistula.  Dissected out the fistula dividing branches between clips and ties.  Made a counterincision towards the axilla dissected out the rest of the fistula.  There was quite a bit of redundancy so I elected to transpose this.  I clamped approximately distally marker for orientation divided near the antecubitum and tunneled it laterally.  Flushed with heparinized saline both directions.  Spatulated both then sewed end-to-end with 6-0 Prolene suture.  At completion there is a strong thrill and a palpable radial pulse the wrist.  Satisfied with this we obtained hemostasis irrigated the wound closed in layers with Vicryl and Monocryl.  Dermabond placed to level the skin.  He is then awakened anesthesia having tolerated procedure without immediate complication.  All counts were correct at  completion.  EBL: 20 cc.   Dajah Fischman C. Donzetta Matters, MD [facility] [place] Office: [phone] Pager: [phone]

## 2018-09-11 ENCOUNTER — Encounter (HOSPITAL_COMMUNITY): Payer: Self-pay | Admitting: Vascular Surgery

## 2018-09-17 ENCOUNTER — Other Ambulatory Visit: Payer: Self-pay

## 2018-09-17 DIAGNOSIS — N186 End stage renal disease: Secondary | ICD-10-CM

## 2018-09-17 DIAGNOSIS — I770 Arteriovenous fistula, acquired: Secondary | ICD-10-CM

## 2018-09-17 DIAGNOSIS — Z992 Dependence on renal dialysis: Principal | ICD-10-CM

## 2018-10-08 ENCOUNTER — Ambulatory Visit (INDEPENDENT_AMBULATORY_CARE_PROVIDER_SITE_OTHER): Payer: Self-pay | Admitting: Physician Assistant

## 2018-10-08 ENCOUNTER — Other Ambulatory Visit: Payer: Self-pay

## 2018-10-08 ENCOUNTER — Ambulatory Visit (HOSPITAL_COMMUNITY)
Admission: RE | Admit: 2018-10-08 | Discharge: 2018-10-08 | Disposition: A | Discharge status: Home / Self Care | Payer: Medicare Other | Source: Ambulatory Visit | Attending: Family Medicine | Admitting: Family Medicine

## 2018-10-08 DIAGNOSIS — I770 Arteriovenous fistula, acquired: Secondary | ICD-10-CM | POA: Insufficient documentation

## 2018-10-08 DIAGNOSIS — N186 End stage renal disease: Secondary | ICD-10-CM | POA: Diagnosis not present

## 2018-10-08 DIAGNOSIS — Z992 Dependence on renal dialysis: Secondary | ICD-10-CM | POA: Diagnosis not present

## 2018-10-08 NOTE — Progress Notes (Signed)
    Postoperative Access Visit   History of Present Illness   Peter [name] is a 68 y.o. year old male who presents for postoperative follow-up for: right brachiocephalic arteriovenous fistula with subsequent translocation due to depth (Date: 09/10/18).  The patient's wounds are healed.  The patient denies steal symptoms.  The patient is able to complete their activities of daily living.  He is currently dialyzing on a MWF schedule via R IJ TDC.   Physical Examination   Vitals:   10/08/18 1425  BP: (!) 124/56  Pulse: (!) 52  Resp: 20  Temp: (!) 97.2 F (36.2 C)  SpO2: 96%  Weight: 239 lb (108.4 kg)  Height: 5\' 6"  (1.676 m)   Body mass index is 38.58 kg/m.  right arm Incisions are healed, hand grip is 5/5, sensation in digits is intact, palpable thrill, bruit can be auscultated, palpable radial pulse     Medical Decision Making   Izaias Krupka is a 68 y.o. year old male who presents s/p right brachiocephalic arteriovenous fistula with subsequent transposition due to depth   Patent fistula with palpable thrill  We will begin using the fistula on Monday 10/12/18  The patient's tunneled dialysis catheter can be removed when Nephrology is comfortable with the performance of the fistula  He may follow up on an as needed basis  Dagoberto Ligas PA-C [facility] [place] Office: [phone]

## 2018-10-09 ENCOUNTER — Encounter (HOSPITAL_COMMUNITY): Payer: Self-pay

## 2019-04-27 IMAGING — CR DG CHEST 1V PORT
1 series · 1 of 1 positions shown · non-contrast
Comparison: Chest x-ray of 05/29/2017

CLINICAL DATA: Placement of dialysis catheter

EXAM:
PORTABLE CHEST 1 VIEW

[AP]
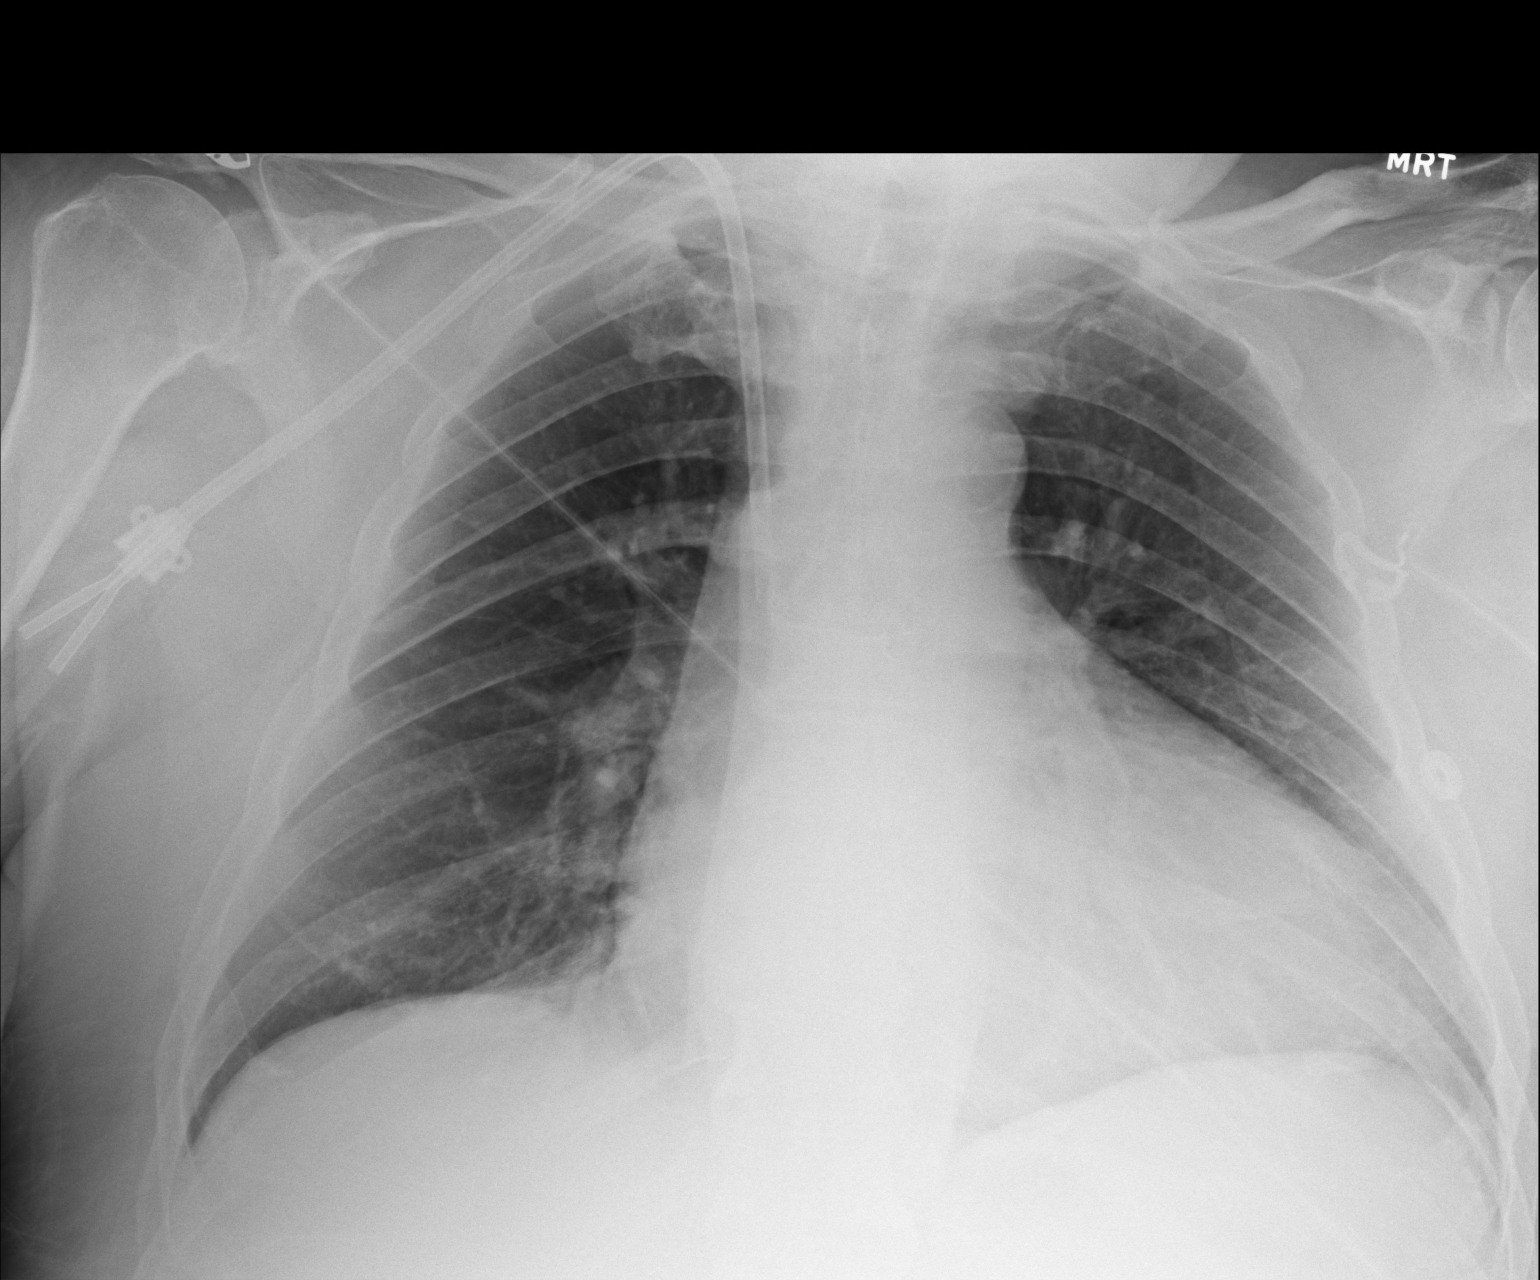

[1 of 1 positions shown; findings below may reference images not displayed]

FINDINGS: A right-sided dialysis catheter is now present with the tip in the
mid SVC. No pneumothorax is seen. The lungs are clear. Cardiomegaly
is stable. No acute bony abnormality is seen.
IMPRESSION: Right-sided dialysis catheter tip overlies the mid SVC. No
pneumothorax is seen. Stable cardiomegaly.

## 2019-05-03 DIAGNOSIS — T7840XA Allergy, unspecified, initial encounter: Secondary | ICD-10-CM | POA: Insufficient documentation

## 2019-05-03 DIAGNOSIS — T782XXA Anaphylactic shock, unspecified, initial encounter: Secondary | ICD-10-CM | POA: Insufficient documentation

## 2020-05-06 DIAGNOSIS — R519 Headache, unspecified: Secondary | ICD-10-CM | POA: Insufficient documentation

## 2020-09-01 DIAGNOSIS — Z4802 Encounter for removal of sutures: Secondary | ICD-10-CM | POA: Insufficient documentation

## 2020-10-02 DIAGNOSIS — E876 Hypokalemia: Secondary | ICD-10-CM | POA: Diagnosis not present

## 2020-10-02 DIAGNOSIS — N2581 Secondary hyperparathyroidism of renal origin: Secondary | ICD-10-CM | POA: Diagnosis not present

## 2020-10-02 DIAGNOSIS — D689 Coagulation defect, unspecified: Secondary | ICD-10-CM | POA: Diagnosis not present

## 2020-10-02 DIAGNOSIS — N186 End stage renal disease: Secondary | ICD-10-CM | POA: Diagnosis not present

## 2020-10-02 DIAGNOSIS — Z992 Dependence on renal dialysis: Secondary | ICD-10-CM | POA: Diagnosis not present

## 2020-10-04 DIAGNOSIS — E876 Hypokalemia: Secondary | ICD-10-CM | POA: Diagnosis not present

## 2020-10-04 DIAGNOSIS — Z992 Dependence on renal dialysis: Secondary | ICD-10-CM | POA: Diagnosis not present

## 2020-10-04 DIAGNOSIS — N2581 Secondary hyperparathyroidism of renal origin: Secondary | ICD-10-CM | POA: Diagnosis not present

## 2020-10-04 DIAGNOSIS — D689 Coagulation defect, unspecified: Secondary | ICD-10-CM | POA: Diagnosis not present

## 2020-10-04 DIAGNOSIS — N186 End stage renal disease: Secondary | ICD-10-CM | POA: Diagnosis not present

## 2020-10-06 DIAGNOSIS — Z992 Dependence on renal dialysis: Secondary | ICD-10-CM | POA: Diagnosis not present

## 2020-10-06 DIAGNOSIS — D689 Coagulation defect, unspecified: Secondary | ICD-10-CM | POA: Diagnosis not present

## 2020-10-06 DIAGNOSIS — N2581 Secondary hyperparathyroidism of renal origin: Secondary | ICD-10-CM | POA: Diagnosis not present

## 2020-10-06 DIAGNOSIS — E876 Hypokalemia: Secondary | ICD-10-CM | POA: Diagnosis not present

## 2020-10-06 DIAGNOSIS — N186 End stage renal disease: Secondary | ICD-10-CM | POA: Diagnosis not present

## 2020-10-09 DIAGNOSIS — N2581 Secondary hyperparathyroidism of renal origin: Secondary | ICD-10-CM | POA: Diagnosis not present

## 2020-10-09 DIAGNOSIS — Z992 Dependence on renal dialysis: Secondary | ICD-10-CM | POA: Diagnosis not present

## 2020-10-09 DIAGNOSIS — E876 Hypokalemia: Secondary | ICD-10-CM | POA: Diagnosis not present

## 2020-10-09 DIAGNOSIS — D689 Coagulation defect, unspecified: Secondary | ICD-10-CM | POA: Diagnosis not present

## 2020-10-09 DIAGNOSIS — N186 End stage renal disease: Secondary | ICD-10-CM | POA: Diagnosis not present

## 2020-10-11 DIAGNOSIS — N2581 Secondary hyperparathyroidism of renal origin: Secondary | ICD-10-CM | POA: Diagnosis not present

## 2020-10-11 DIAGNOSIS — E876 Hypokalemia: Secondary | ICD-10-CM | POA: Diagnosis not present

## 2020-10-11 DIAGNOSIS — D689 Coagulation defect, unspecified: Secondary | ICD-10-CM | POA: Diagnosis not present

## 2020-10-11 DIAGNOSIS — Z992 Dependence on renal dialysis: Secondary | ICD-10-CM | POA: Diagnosis not present

## 2020-10-11 DIAGNOSIS — N186 End stage renal disease: Secondary | ICD-10-CM | POA: Diagnosis not present

## 2020-10-13 DIAGNOSIS — N186 End stage renal disease: Secondary | ICD-10-CM | POA: Diagnosis not present

## 2020-10-13 DIAGNOSIS — D689 Coagulation defect, unspecified: Secondary | ICD-10-CM | POA: Diagnosis not present

## 2020-10-13 DIAGNOSIS — N2581 Secondary hyperparathyroidism of renal origin: Secondary | ICD-10-CM | POA: Diagnosis not present

## 2020-10-13 DIAGNOSIS — E876 Hypokalemia: Secondary | ICD-10-CM | POA: Diagnosis not present

## 2020-10-13 DIAGNOSIS — Z992 Dependence on renal dialysis: Secondary | ICD-10-CM | POA: Diagnosis not present

## 2020-10-16 DIAGNOSIS — Z992 Dependence on renal dialysis: Secondary | ICD-10-CM | POA: Diagnosis not present

## 2020-10-16 DIAGNOSIS — D689 Coagulation defect, unspecified: Secondary | ICD-10-CM | POA: Diagnosis not present

## 2020-10-16 DIAGNOSIS — E876 Hypokalemia: Secondary | ICD-10-CM | POA: Diagnosis not present

## 2020-10-16 DIAGNOSIS — N2581 Secondary hyperparathyroidism of renal origin: Secondary | ICD-10-CM | POA: Diagnosis not present

## 2020-10-16 DIAGNOSIS — N186 End stage renal disease: Secondary | ICD-10-CM | POA: Diagnosis not present

## 2020-10-18 DIAGNOSIS — E876 Hypokalemia: Secondary | ICD-10-CM | POA: Diagnosis not present

## 2020-10-18 DIAGNOSIS — Z992 Dependence on renal dialysis: Secondary | ICD-10-CM | POA: Diagnosis not present

## 2020-10-18 DIAGNOSIS — N2581 Secondary hyperparathyroidism of renal origin: Secondary | ICD-10-CM | POA: Diagnosis not present

## 2020-10-18 DIAGNOSIS — N186 End stage renal disease: Secondary | ICD-10-CM | POA: Diagnosis not present

## 2020-10-18 DIAGNOSIS — D689 Coagulation defect, unspecified: Secondary | ICD-10-CM | POA: Diagnosis not present

## 2020-10-20 DIAGNOSIS — Z992 Dependence on renal dialysis: Secondary | ICD-10-CM | POA: Diagnosis not present

## 2020-10-20 DIAGNOSIS — E876 Hypokalemia: Secondary | ICD-10-CM | POA: Diagnosis not present

## 2020-10-20 DIAGNOSIS — N2581 Secondary hyperparathyroidism of renal origin: Secondary | ICD-10-CM | POA: Diagnosis not present

## 2020-10-20 DIAGNOSIS — N186 End stage renal disease: Secondary | ICD-10-CM | POA: Diagnosis not present

## 2020-10-20 DIAGNOSIS — D689 Coagulation defect, unspecified: Secondary | ICD-10-CM | POA: Diagnosis not present

## 2020-10-23 DIAGNOSIS — N2581 Secondary hyperparathyroidism of renal origin: Secondary | ICD-10-CM | POA: Diagnosis not present

## 2020-10-23 DIAGNOSIS — N186 End stage renal disease: Secondary | ICD-10-CM | POA: Diagnosis not present

## 2020-10-23 DIAGNOSIS — E876 Hypokalemia: Secondary | ICD-10-CM | POA: Diagnosis not present

## 2020-10-23 DIAGNOSIS — Z992 Dependence on renal dialysis: Secondary | ICD-10-CM | POA: Diagnosis not present

## 2020-10-23 DIAGNOSIS — D689 Coagulation defect, unspecified: Secondary | ICD-10-CM | POA: Diagnosis not present

## 2020-10-25 DIAGNOSIS — E876 Hypokalemia: Secondary | ICD-10-CM | POA: Diagnosis not present

## 2020-10-25 DIAGNOSIS — Z992 Dependence on renal dialysis: Secondary | ICD-10-CM | POA: Diagnosis not present

## 2020-10-25 DIAGNOSIS — N186 End stage renal disease: Secondary | ICD-10-CM | POA: Diagnosis not present

## 2020-10-25 DIAGNOSIS — N2581 Secondary hyperparathyroidism of renal origin: Secondary | ICD-10-CM | POA: Diagnosis not present

## 2020-10-25 DIAGNOSIS — D689 Coagulation defect, unspecified: Secondary | ICD-10-CM | POA: Diagnosis not present

## 2020-10-27 DIAGNOSIS — N186 End stage renal disease: Secondary | ICD-10-CM | POA: Diagnosis not present

## 2020-10-27 DIAGNOSIS — D689 Coagulation defect, unspecified: Secondary | ICD-10-CM | POA: Diagnosis not present

## 2020-10-27 DIAGNOSIS — Z992 Dependence on renal dialysis: Secondary | ICD-10-CM | POA: Diagnosis not present

## 2020-10-27 DIAGNOSIS — N2581 Secondary hyperparathyroidism of renal origin: Secondary | ICD-10-CM | POA: Diagnosis not present

## 2020-10-27 DIAGNOSIS — E876 Hypokalemia: Secondary | ICD-10-CM | POA: Diagnosis not present

## 2020-10-30 DIAGNOSIS — Z992 Dependence on renal dialysis: Secondary | ICD-10-CM | POA: Diagnosis not present

## 2020-10-30 DIAGNOSIS — D689 Coagulation defect, unspecified: Secondary | ICD-10-CM | POA: Diagnosis not present

## 2020-10-30 DIAGNOSIS — N186 End stage renal disease: Secondary | ICD-10-CM | POA: Diagnosis not present

## 2020-10-30 DIAGNOSIS — N2581 Secondary hyperparathyroidism of renal origin: Secondary | ICD-10-CM | POA: Diagnosis not present

## 2020-10-30 DIAGNOSIS — E1129 Type 2 diabetes mellitus with other diabetic kidney complication: Secondary | ICD-10-CM | POA: Diagnosis not present

## 2020-10-30 DIAGNOSIS — E876 Hypokalemia: Secondary | ICD-10-CM | POA: Diagnosis not present

## 2020-11-01 DIAGNOSIS — E876 Hypokalemia: Secondary | ICD-10-CM | POA: Diagnosis not present

## 2020-11-01 DIAGNOSIS — Z992 Dependence on renal dialysis: Secondary | ICD-10-CM | POA: Diagnosis not present

## 2020-11-01 DIAGNOSIS — N2581 Secondary hyperparathyroidism of renal origin: Secondary | ICD-10-CM | POA: Diagnosis not present

## 2020-11-01 DIAGNOSIS — D689 Coagulation defect, unspecified: Secondary | ICD-10-CM | POA: Diagnosis not present

## 2020-11-01 DIAGNOSIS — N186 End stage renal disease: Secondary | ICD-10-CM | POA: Diagnosis not present

## 2020-11-03 DIAGNOSIS — N2581 Secondary hyperparathyroidism of renal origin: Secondary | ICD-10-CM | POA: Diagnosis not present

## 2020-11-03 DIAGNOSIS — N186 End stage renal disease: Secondary | ICD-10-CM | POA: Diagnosis not present

## 2020-11-03 DIAGNOSIS — D689 Coagulation defect, unspecified: Secondary | ICD-10-CM | POA: Diagnosis not present

## 2020-11-03 DIAGNOSIS — E876 Hypokalemia: Secondary | ICD-10-CM | POA: Diagnosis not present

## 2020-11-03 DIAGNOSIS — Z992 Dependence on renal dialysis: Secondary | ICD-10-CM | POA: Diagnosis not present

## 2020-11-06 DIAGNOSIS — E876 Hypokalemia: Secondary | ICD-10-CM | POA: Diagnosis not present

## 2020-11-06 DIAGNOSIS — N186 End stage renal disease: Secondary | ICD-10-CM | POA: Diagnosis not present

## 2020-11-06 DIAGNOSIS — N2581 Secondary hyperparathyroidism of renal origin: Secondary | ICD-10-CM | POA: Diagnosis not present

## 2020-11-06 DIAGNOSIS — D689 Coagulation defect, unspecified: Secondary | ICD-10-CM | POA: Diagnosis not present

## 2020-11-06 DIAGNOSIS — Z992 Dependence on renal dialysis: Secondary | ICD-10-CM | POA: Diagnosis not present

## 2020-11-08 DIAGNOSIS — N2581 Secondary hyperparathyroidism of renal origin: Secondary | ICD-10-CM | POA: Diagnosis not present

## 2020-11-08 DIAGNOSIS — E876 Hypokalemia: Secondary | ICD-10-CM | POA: Diagnosis not present

## 2020-11-08 DIAGNOSIS — D689 Coagulation defect, unspecified: Secondary | ICD-10-CM | POA: Diagnosis not present

## 2020-11-08 DIAGNOSIS — N186 End stage renal disease: Secondary | ICD-10-CM | POA: Diagnosis not present

## 2020-11-08 DIAGNOSIS — Z992 Dependence on renal dialysis: Secondary | ICD-10-CM | POA: Diagnosis not present

## 2020-11-10 DIAGNOSIS — N186 End stage renal disease: Secondary | ICD-10-CM | POA: Diagnosis not present

## 2020-11-10 DIAGNOSIS — N2581 Secondary hyperparathyroidism of renal origin: Secondary | ICD-10-CM | POA: Diagnosis not present

## 2020-11-10 DIAGNOSIS — E876 Hypokalemia: Secondary | ICD-10-CM | POA: Diagnosis not present

## 2020-11-10 DIAGNOSIS — Z992 Dependence on renal dialysis: Secondary | ICD-10-CM | POA: Diagnosis not present

## 2020-11-10 DIAGNOSIS — D689 Coagulation defect, unspecified: Secondary | ICD-10-CM | POA: Diagnosis not present

## 2020-11-13 DIAGNOSIS — N186 End stage renal disease: Secondary | ICD-10-CM | POA: Diagnosis not present

## 2020-11-13 DIAGNOSIS — D689 Coagulation defect, unspecified: Secondary | ICD-10-CM | POA: Diagnosis not present

## 2020-11-13 DIAGNOSIS — N2581 Secondary hyperparathyroidism of renal origin: Secondary | ICD-10-CM | POA: Diagnosis not present

## 2020-11-13 DIAGNOSIS — Z992 Dependence on renal dialysis: Secondary | ICD-10-CM | POA: Diagnosis not present

## 2020-11-13 DIAGNOSIS — E876 Hypokalemia: Secondary | ICD-10-CM | POA: Diagnosis not present

## 2020-11-15 DIAGNOSIS — N2581 Secondary hyperparathyroidism of renal origin: Secondary | ICD-10-CM | POA: Diagnosis not present

## 2020-11-15 DIAGNOSIS — N186 End stage renal disease: Secondary | ICD-10-CM | POA: Diagnosis not present

## 2020-11-15 DIAGNOSIS — D689 Coagulation defect, unspecified: Secondary | ICD-10-CM | POA: Diagnosis not present

## 2020-11-15 DIAGNOSIS — Z992 Dependence on renal dialysis: Secondary | ICD-10-CM | POA: Diagnosis not present

## 2020-11-15 DIAGNOSIS — E876 Hypokalemia: Secondary | ICD-10-CM | POA: Diagnosis not present

## 2020-11-17 DIAGNOSIS — D689 Coagulation defect, unspecified: Secondary | ICD-10-CM | POA: Diagnosis not present

## 2020-11-17 DIAGNOSIS — E876 Hypokalemia: Secondary | ICD-10-CM | POA: Diagnosis not present

## 2020-11-17 DIAGNOSIS — N2581 Secondary hyperparathyroidism of renal origin: Secondary | ICD-10-CM | POA: Diagnosis not present

## 2020-11-17 DIAGNOSIS — Z992 Dependence on renal dialysis: Secondary | ICD-10-CM | POA: Diagnosis not present

## 2020-11-17 DIAGNOSIS — N186 End stage renal disease: Secondary | ICD-10-CM | POA: Diagnosis not present

## 2020-11-20 DIAGNOSIS — N2581 Secondary hyperparathyroidism of renal origin: Secondary | ICD-10-CM | POA: Diagnosis not present

## 2020-11-20 DIAGNOSIS — N186 End stage renal disease: Secondary | ICD-10-CM | POA: Diagnosis not present

## 2020-11-20 DIAGNOSIS — Z992 Dependence on renal dialysis: Secondary | ICD-10-CM | POA: Diagnosis not present

## 2020-11-20 DIAGNOSIS — E876 Hypokalemia: Secondary | ICD-10-CM | POA: Diagnosis not present

## 2020-11-20 DIAGNOSIS — D689 Coagulation defect, unspecified: Secondary | ICD-10-CM | POA: Diagnosis not present

## 2020-11-22 DIAGNOSIS — N186 End stage renal disease: Secondary | ICD-10-CM | POA: Diagnosis not present

## 2020-11-22 DIAGNOSIS — E876 Hypokalemia: Secondary | ICD-10-CM | POA: Diagnosis not present

## 2020-11-22 DIAGNOSIS — Z992 Dependence on renal dialysis: Secondary | ICD-10-CM | POA: Diagnosis not present

## 2020-11-22 DIAGNOSIS — D689 Coagulation defect, unspecified: Secondary | ICD-10-CM | POA: Diagnosis not present

## 2020-11-22 DIAGNOSIS — N2581 Secondary hyperparathyroidism of renal origin: Secondary | ICD-10-CM | POA: Diagnosis not present

## 2020-11-24 DIAGNOSIS — N186 End stage renal disease: Secondary | ICD-10-CM | POA: Diagnosis not present

## 2020-11-24 DIAGNOSIS — E876 Hypokalemia: Secondary | ICD-10-CM | POA: Diagnosis not present

## 2020-11-24 DIAGNOSIS — N2581 Secondary hyperparathyroidism of renal origin: Secondary | ICD-10-CM | POA: Diagnosis not present

## 2020-11-24 DIAGNOSIS — D689 Coagulation defect, unspecified: Secondary | ICD-10-CM | POA: Diagnosis not present

## 2020-11-24 DIAGNOSIS — Z992 Dependence on renal dialysis: Secondary | ICD-10-CM | POA: Diagnosis not present

## 2020-11-27 DIAGNOSIS — E876 Hypokalemia: Secondary | ICD-10-CM | POA: Diagnosis not present

## 2020-11-27 DIAGNOSIS — D689 Coagulation defect, unspecified: Secondary | ICD-10-CM | POA: Diagnosis not present

## 2020-11-27 DIAGNOSIS — Z992 Dependence on renal dialysis: Secondary | ICD-10-CM | POA: Diagnosis not present

## 2020-11-27 DIAGNOSIS — E1129 Type 2 diabetes mellitus with other diabetic kidney complication: Secondary | ICD-10-CM | POA: Diagnosis not present

## 2020-11-27 DIAGNOSIS — N2581 Secondary hyperparathyroidism of renal origin: Secondary | ICD-10-CM | POA: Diagnosis not present

## 2020-11-27 DIAGNOSIS — N186 End stage renal disease: Secondary | ICD-10-CM | POA: Diagnosis not present

## 2020-11-29 DIAGNOSIS — N186 End stage renal disease: Secondary | ICD-10-CM | POA: Diagnosis not present

## 2020-11-29 DIAGNOSIS — E876 Hypokalemia: Secondary | ICD-10-CM | POA: Diagnosis not present

## 2020-11-29 DIAGNOSIS — Z992 Dependence on renal dialysis: Secondary | ICD-10-CM | POA: Diagnosis not present

## 2020-11-29 DIAGNOSIS — N2581 Secondary hyperparathyroidism of renal origin: Secondary | ICD-10-CM | POA: Diagnosis not present

## 2020-11-29 DIAGNOSIS — D689 Coagulation defect, unspecified: Secondary | ICD-10-CM | POA: Diagnosis not present

## 2020-11-30 DIAGNOSIS — Z79899 Other long term (current) drug therapy: Secondary | ICD-10-CM | POA: Diagnosis not present

## 2020-11-30 DIAGNOSIS — N2581 Secondary hyperparathyroidism of renal origin: Secondary | ICD-10-CM | POA: Diagnosis not present

## 2020-11-30 DIAGNOSIS — D692 Other nonthrombocytopenic purpura: Secondary | ICD-10-CM | POA: Diagnosis not present

## 2020-11-30 DIAGNOSIS — I129 Hypertensive chronic kidney disease with stage 1 through stage 4 chronic kidney disease, or unspecified chronic kidney disease: Secondary | ICD-10-CM | POA: Diagnosis not present

## 2020-11-30 DIAGNOSIS — N185 Chronic kidney disease, stage 5: Secondary | ICD-10-CM | POA: Diagnosis not present

## 2020-11-30 DIAGNOSIS — I340 Nonrheumatic mitral (valve) insufficiency: Secondary | ICD-10-CM | POA: Diagnosis not present

## 2020-11-30 DIAGNOSIS — E1129 Type 2 diabetes mellitus with other diabetic kidney complication: Secondary | ICD-10-CM | POA: Diagnosis not present

## 2020-11-30 DIAGNOSIS — E7800 Pure hypercholesterolemia, unspecified: Secondary | ICD-10-CM | POA: Diagnosis not present

## 2020-11-30 DIAGNOSIS — G4733 Obstructive sleep apnea (adult) (pediatric): Secondary | ICD-10-CM | POA: Diagnosis not present

## 2020-12-01 DIAGNOSIS — N186 End stage renal disease: Secondary | ICD-10-CM | POA: Diagnosis not present

## 2020-12-01 DIAGNOSIS — D689 Coagulation defect, unspecified: Secondary | ICD-10-CM | POA: Diagnosis not present

## 2020-12-01 DIAGNOSIS — Z992 Dependence on renal dialysis: Secondary | ICD-10-CM | POA: Diagnosis not present

## 2020-12-01 DIAGNOSIS — N2581 Secondary hyperparathyroidism of renal origin: Secondary | ICD-10-CM | POA: Diagnosis not present

## 2020-12-01 DIAGNOSIS — E876 Hypokalemia: Secondary | ICD-10-CM | POA: Diagnosis not present

## 2020-12-04 DIAGNOSIS — Z992 Dependence on renal dialysis: Secondary | ICD-10-CM | POA: Diagnosis not present

## 2020-12-04 DIAGNOSIS — T8249XA Other complication of vascular dialysis catheter, initial encounter: Secondary | ICD-10-CM | POA: Diagnosis not present

## 2020-12-04 DIAGNOSIS — E876 Hypokalemia: Secondary | ICD-10-CM | POA: Diagnosis not present

## 2020-12-04 DIAGNOSIS — N2581 Secondary hyperparathyroidism of renal origin: Secondary | ICD-10-CM | POA: Diagnosis not present

## 2020-12-04 DIAGNOSIS — N186 End stage renal disease: Secondary | ICD-10-CM | POA: Diagnosis not present

## 2020-12-04 DIAGNOSIS — D689 Coagulation defect, unspecified: Secondary | ICD-10-CM | POA: Diagnosis not present

## 2020-12-06 DIAGNOSIS — N186 End stage renal disease: Secondary | ICD-10-CM | POA: Diagnosis not present

## 2020-12-06 DIAGNOSIS — N2581 Secondary hyperparathyroidism of renal origin: Secondary | ICD-10-CM | POA: Diagnosis not present

## 2020-12-06 DIAGNOSIS — I871 Compression of vein: Secondary | ICD-10-CM | POA: Diagnosis not present

## 2020-12-06 DIAGNOSIS — D689 Coagulation defect, unspecified: Secondary | ICD-10-CM | POA: Diagnosis not present

## 2020-12-06 DIAGNOSIS — E876 Hypokalemia: Secondary | ICD-10-CM | POA: Diagnosis not present

## 2020-12-06 DIAGNOSIS — Z992 Dependence on renal dialysis: Secondary | ICD-10-CM | POA: Diagnosis not present

## 2020-12-06 DIAGNOSIS — T8249XA Other complication of vascular dialysis catheter, initial encounter: Secondary | ICD-10-CM | POA: Diagnosis not present

## 2020-12-06 DIAGNOSIS — T82868A Thrombosis of vascular prosthetic devices, implants and grafts, initial encounter: Secondary | ICD-10-CM | POA: Diagnosis not present

## 2020-12-08 DIAGNOSIS — E876 Hypokalemia: Secondary | ICD-10-CM | POA: Diagnosis not present

## 2020-12-08 DIAGNOSIS — D689 Coagulation defect, unspecified: Secondary | ICD-10-CM | POA: Diagnosis not present

## 2020-12-08 DIAGNOSIS — Z992 Dependence on renal dialysis: Secondary | ICD-10-CM | POA: Diagnosis not present

## 2020-12-08 DIAGNOSIS — N2581 Secondary hyperparathyroidism of renal origin: Secondary | ICD-10-CM | POA: Diagnosis not present

## 2020-12-08 DIAGNOSIS — T8249XA Other complication of vascular dialysis catheter, initial encounter: Secondary | ICD-10-CM | POA: Diagnosis not present

## 2020-12-08 DIAGNOSIS — N186 End stage renal disease: Secondary | ICD-10-CM | POA: Diagnosis not present

## 2020-12-11 DIAGNOSIS — T8249XA Other complication of vascular dialysis catheter, initial encounter: Secondary | ICD-10-CM | POA: Diagnosis not present

## 2020-12-11 DIAGNOSIS — N2581 Secondary hyperparathyroidism of renal origin: Secondary | ICD-10-CM | POA: Diagnosis not present

## 2020-12-11 DIAGNOSIS — N186 End stage renal disease: Secondary | ICD-10-CM | POA: Diagnosis not present

## 2020-12-11 DIAGNOSIS — D689 Coagulation defect, unspecified: Secondary | ICD-10-CM | POA: Diagnosis not present

## 2020-12-11 DIAGNOSIS — Z992 Dependence on renal dialysis: Secondary | ICD-10-CM | POA: Diagnosis not present

## 2020-12-13 DIAGNOSIS — N2581 Secondary hyperparathyroidism of renal origin: Secondary | ICD-10-CM | POA: Diagnosis not present

## 2020-12-13 DIAGNOSIS — D689 Coagulation defect, unspecified: Secondary | ICD-10-CM | POA: Diagnosis not present

## 2020-12-13 DIAGNOSIS — Z992 Dependence on renal dialysis: Secondary | ICD-10-CM | POA: Diagnosis not present

## 2020-12-13 DIAGNOSIS — N186 End stage renal disease: Secondary | ICD-10-CM | POA: Diagnosis not present

## 2020-12-13 DIAGNOSIS — T8249XA Other complication of vascular dialysis catheter, initial encounter: Secondary | ICD-10-CM | POA: Diagnosis not present

## 2020-12-15 DIAGNOSIS — T8249XA Other complication of vascular dialysis catheter, initial encounter: Secondary | ICD-10-CM | POA: Diagnosis not present

## 2020-12-15 DIAGNOSIS — N186 End stage renal disease: Secondary | ICD-10-CM | POA: Diagnosis not present

## 2020-12-15 DIAGNOSIS — Z992 Dependence on renal dialysis: Secondary | ICD-10-CM | POA: Diagnosis not present

## 2020-12-15 DIAGNOSIS — N2581 Secondary hyperparathyroidism of renal origin: Secondary | ICD-10-CM | POA: Diagnosis not present

## 2020-12-15 DIAGNOSIS — D689 Coagulation defect, unspecified: Secondary | ICD-10-CM | POA: Diagnosis not present

## 2020-12-18 DIAGNOSIS — N2581 Secondary hyperparathyroidism of renal origin: Secondary | ICD-10-CM | POA: Diagnosis not present

## 2020-12-18 DIAGNOSIS — E876 Hypokalemia: Secondary | ICD-10-CM | POA: Diagnosis not present

## 2020-12-18 DIAGNOSIS — D689 Coagulation defect, unspecified: Secondary | ICD-10-CM | POA: Diagnosis not present

## 2020-12-18 DIAGNOSIS — Z992 Dependence on renal dialysis: Secondary | ICD-10-CM | POA: Diagnosis not present

## 2020-12-18 DIAGNOSIS — T8249XA Other complication of vascular dialysis catheter, initial encounter: Secondary | ICD-10-CM | POA: Diagnosis not present

## 2020-12-18 DIAGNOSIS — N186 End stage renal disease: Secondary | ICD-10-CM | POA: Diagnosis not present

## 2020-12-20 DIAGNOSIS — N2581 Secondary hyperparathyroidism of renal origin: Secondary | ICD-10-CM | POA: Diagnosis not present

## 2020-12-20 DIAGNOSIS — N186 End stage renal disease: Secondary | ICD-10-CM | POA: Diagnosis not present

## 2020-12-20 DIAGNOSIS — D689 Coagulation defect, unspecified: Secondary | ICD-10-CM | POA: Diagnosis not present

## 2020-12-20 DIAGNOSIS — T8249XA Other complication of vascular dialysis catheter, initial encounter: Secondary | ICD-10-CM | POA: Diagnosis not present

## 2020-12-20 DIAGNOSIS — E876 Hypokalemia: Secondary | ICD-10-CM | POA: Diagnosis not present

## 2020-12-20 DIAGNOSIS — Z992 Dependence on renal dialysis: Secondary | ICD-10-CM | POA: Diagnosis not present

## 2020-12-22 DIAGNOSIS — N2581 Secondary hyperparathyroidism of renal origin: Secondary | ICD-10-CM | POA: Diagnosis not present

## 2020-12-22 DIAGNOSIS — E876 Hypokalemia: Secondary | ICD-10-CM | POA: Diagnosis not present

## 2020-12-22 DIAGNOSIS — Z992 Dependence on renal dialysis: Secondary | ICD-10-CM | POA: Diagnosis not present

## 2020-12-22 DIAGNOSIS — N186 End stage renal disease: Secondary | ICD-10-CM | POA: Diagnosis not present

## 2020-12-22 DIAGNOSIS — T8249XA Other complication of vascular dialysis catheter, initial encounter: Secondary | ICD-10-CM | POA: Diagnosis not present

## 2020-12-22 DIAGNOSIS — D689 Coagulation defect, unspecified: Secondary | ICD-10-CM | POA: Diagnosis not present

## 2020-12-25 DIAGNOSIS — D689 Coagulation defect, unspecified: Secondary | ICD-10-CM | POA: Diagnosis not present

## 2020-12-25 DIAGNOSIS — T8249XA Other complication of vascular dialysis catheter, initial encounter: Secondary | ICD-10-CM | POA: Diagnosis not present

## 2020-12-25 DIAGNOSIS — E876 Hypokalemia: Secondary | ICD-10-CM | POA: Diagnosis not present

## 2020-12-25 DIAGNOSIS — N186 End stage renal disease: Secondary | ICD-10-CM | POA: Diagnosis not present

## 2020-12-25 DIAGNOSIS — N2581 Secondary hyperparathyroidism of renal origin: Secondary | ICD-10-CM | POA: Diagnosis not present

## 2020-12-25 DIAGNOSIS — Z992 Dependence on renal dialysis: Secondary | ICD-10-CM | POA: Diagnosis not present

## 2020-12-27 DIAGNOSIS — N186 End stage renal disease: Secondary | ICD-10-CM | POA: Diagnosis not present

## 2020-12-27 DIAGNOSIS — Z992 Dependence on renal dialysis: Secondary | ICD-10-CM | POA: Diagnosis not present

## 2020-12-27 DIAGNOSIS — T8249XA Other complication of vascular dialysis catheter, initial encounter: Secondary | ICD-10-CM | POA: Diagnosis not present

## 2020-12-27 DIAGNOSIS — D689 Coagulation defect, unspecified: Secondary | ICD-10-CM | POA: Diagnosis not present

## 2020-12-27 DIAGNOSIS — E876 Hypokalemia: Secondary | ICD-10-CM | POA: Diagnosis not present

## 2020-12-27 DIAGNOSIS — N2581 Secondary hyperparathyroidism of renal origin: Secondary | ICD-10-CM | POA: Diagnosis not present

## 2020-12-28 ENCOUNTER — Other Ambulatory Visit: Payer: Self-pay

## 2020-12-28 DIAGNOSIS — N186 End stage renal disease: Secondary | ICD-10-CM | POA: Diagnosis not present

## 2020-12-28 DIAGNOSIS — E1129 Type 2 diabetes mellitus with other diabetic kidney complication: Secondary | ICD-10-CM | POA: Diagnosis not present

## 2020-12-28 DIAGNOSIS — Z992 Dependence on renal dialysis: Secondary | ICD-10-CM | POA: Diagnosis not present

## 2020-12-29 DIAGNOSIS — D689 Coagulation defect, unspecified: Secondary | ICD-10-CM | POA: Diagnosis not present

## 2020-12-29 DIAGNOSIS — Z992 Dependence on renal dialysis: Secondary | ICD-10-CM | POA: Diagnosis not present

## 2020-12-29 DIAGNOSIS — N2581 Secondary hyperparathyroidism of renal origin: Secondary | ICD-10-CM | POA: Diagnosis not present

## 2020-12-29 DIAGNOSIS — N186 End stage renal disease: Secondary | ICD-10-CM | POA: Diagnosis not present

## 2020-12-29 DIAGNOSIS — T8249XA Other complication of vascular dialysis catheter, initial encounter: Secondary | ICD-10-CM | POA: Diagnosis not present

## 2021-01-01 DIAGNOSIS — N2581 Secondary hyperparathyroidism of renal origin: Secondary | ICD-10-CM | POA: Diagnosis not present

## 2021-01-01 DIAGNOSIS — Z992 Dependence on renal dialysis: Secondary | ICD-10-CM | POA: Diagnosis not present

## 2021-01-01 DIAGNOSIS — E876 Hypokalemia: Secondary | ICD-10-CM | POA: Diagnosis not present

## 2021-01-01 DIAGNOSIS — D689 Coagulation defect, unspecified: Secondary | ICD-10-CM | POA: Diagnosis not present

## 2021-01-01 DIAGNOSIS — T8249XA Other complication of vascular dialysis catheter, initial encounter: Secondary | ICD-10-CM | POA: Diagnosis not present

## 2021-01-01 DIAGNOSIS — N186 End stage renal disease: Secondary | ICD-10-CM | POA: Diagnosis not present

## 2021-01-03 DIAGNOSIS — T8249XA Other complication of vascular dialysis catheter, initial encounter: Secondary | ICD-10-CM | POA: Diagnosis not present

## 2021-01-03 DIAGNOSIS — N2581 Secondary hyperparathyroidism of renal origin: Secondary | ICD-10-CM | POA: Diagnosis not present

## 2021-01-03 DIAGNOSIS — E876 Hypokalemia: Secondary | ICD-10-CM | POA: Diagnosis not present

## 2021-01-03 DIAGNOSIS — D689 Coagulation defect, unspecified: Secondary | ICD-10-CM | POA: Diagnosis not present

## 2021-01-03 DIAGNOSIS — Z992 Dependence on renal dialysis: Secondary | ICD-10-CM | POA: Diagnosis not present

## 2021-01-03 DIAGNOSIS — N186 End stage renal disease: Secondary | ICD-10-CM | POA: Diagnosis not present

## 2021-01-05 DIAGNOSIS — N186 End stage renal disease: Secondary | ICD-10-CM | POA: Diagnosis not present

## 2021-01-05 DIAGNOSIS — N2581 Secondary hyperparathyroidism of renal origin: Secondary | ICD-10-CM | POA: Diagnosis not present

## 2021-01-05 DIAGNOSIS — T8249XA Other complication of vascular dialysis catheter, initial encounter: Secondary | ICD-10-CM | POA: Diagnosis not present

## 2021-01-05 DIAGNOSIS — Z992 Dependence on renal dialysis: Secondary | ICD-10-CM | POA: Diagnosis not present

## 2021-01-05 DIAGNOSIS — D689 Coagulation defect, unspecified: Secondary | ICD-10-CM | POA: Diagnosis not present

## 2021-01-05 DIAGNOSIS — E876 Hypokalemia: Secondary | ICD-10-CM | POA: Diagnosis not present

## 2021-01-08 DIAGNOSIS — Z992 Dependence on renal dialysis: Secondary | ICD-10-CM | POA: Diagnosis not present

## 2021-01-08 DIAGNOSIS — E876 Hypokalemia: Secondary | ICD-10-CM | POA: Diagnosis not present

## 2021-01-08 DIAGNOSIS — D689 Coagulation defect, unspecified: Secondary | ICD-10-CM | POA: Diagnosis not present

## 2021-01-08 DIAGNOSIS — T8249XA Other complication of vascular dialysis catheter, initial encounter: Secondary | ICD-10-CM | POA: Diagnosis not present

## 2021-01-08 DIAGNOSIS — N2581 Secondary hyperparathyroidism of renal origin: Secondary | ICD-10-CM | POA: Diagnosis not present

## 2021-01-08 DIAGNOSIS — N186 End stage renal disease: Secondary | ICD-10-CM | POA: Diagnosis not present

## 2021-01-09 ENCOUNTER — Other Ambulatory Visit: Payer: Self-pay

## 2021-01-09 ENCOUNTER — Encounter: Payer: Self-pay | Admitting: Vascular Surgery

## 2021-01-09 ENCOUNTER — Ambulatory Visit (HOSPITAL_COMMUNITY)
Admission: RE | Admit: 2021-01-09 | Discharge: 2021-01-09 | Disposition: A | Discharge status: Home / Self Care | Payer: Medicare Other | Source: Ambulatory Visit | Attending: Vascular Surgery | Admitting: Vascular Surgery

## 2021-01-09 ENCOUNTER — Ambulatory Visit (INDEPENDENT_AMBULATORY_CARE_PROVIDER_SITE_OTHER): Payer: Medicare Other | Admitting: Vascular Surgery

## 2021-01-09 ENCOUNTER — Ambulatory Visit (INDEPENDENT_AMBULATORY_CARE_PROVIDER_SITE_OTHER)
Admission: RE | Admit: 2021-01-09 | Discharge: 2021-01-09 | Disposition: A | Discharge status: Home / Self Care | Payer: Medicare Other | Source: Ambulatory Visit | Attending: Vascular Surgery | Admitting: Vascular Surgery

## 2021-01-09 VITALS — BP 118/70 | HR 72 | Temp 98.2°F | Resp 20 | Ht 66.0 in | Wt 231.0 lb

## 2021-01-09 DIAGNOSIS — N186 End stage renal disease: Secondary | ICD-10-CM

## 2021-01-09 DIAGNOSIS — Z992 Dependence on renal dialysis: Secondary | ICD-10-CM

## 2021-01-09 NOTE — H&P (View-Only) (Signed)
[facility] [place]  ASSESSMENT / PLAN: 70 y.o. male with ESRD dialyzing via RIJ TDC. He has unfortunately thrombosed bilateral brachiocephalic arteriovenous fistulae.  He has suitable basilic vein in both arms for fistula creation.  We will plan for left upper extremity brachiobasilic AV fistula creation 01/11/2021.  CHIEF COMPLAINT: End-stage renal disease in need of HD access  HISTORY OF PRESENT ILLNESS: Peter [name] is a 70 y.o. male with end-stage renal disease dialyzing Monday Wednesday and Friday.  He is right-handed.  He has a right internal jugular tunneled dialysis catheter in place which is working well for him.  He has unfortunately thrombosed bilateral upper extremity brachiocephalic AV fistulae.  Past Medical History:  Diagnosis Date  . Anemia in chronic kidney disease (CKD)   . CKD (chronic kidney disease) stage 5, GFR less than 15 ml/min (HCC)   . Diabetes mellitus without complication ([place]) 84/1324   type II  . ED (erectile dysfunction)   . Heart murmur    not significant  . History of kidney stones    passed 1 , 1 was removed with kidney disease in 2010  . Hypercholesteremia   . Hypertension   . Iron deficiency   . Kidney stone 2010  . 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 OSA (obstructive sleep apnea)    AHI 38/hr on CPAP at 12cm h2o  . 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  . Syncope     Past Surgical History:  Procedure Laterality Date  . A/V FISTULAGRAM Left 02/10/2018   Procedure: A/V FISTULAGRAM;  Surgeon: Serafina Mitchell, MD;  Location: [facility] [facility];  Service: Cardiovascular;  Laterality: Left;  . AV FISTULA PLACEMENT Left 06/14/2016   Procedure: ARTERIOVENOUS (AV) FISTULA CREATION LEFT ARM;  Surgeon: Angelia Mould, MD;  Location: [place];  Service: Vascular;  Laterality: Left;  . AV FISTULA PLACEMENT Right 06/09/2018   Procedure: CREATION Right arm Brachiocephalic;   Surgeon: Waynetta Sandy, MD;  Location: [place];  Service: Vascular;  Laterality: Right;  . [place] TRANSPOSITION Right 09/10/2018   Procedure: CEPHALIC VEIN TRANSPOSITION;  Surgeon: Waynetta Sandy, MD;  Location: [place];  Service: Vascular;  Laterality: Right;  . COLONOSCOPY    . INSERTION OF DIALYSIS CATHETER N/A 06/09/2018   Procedure: INSERTION OF DIALYSIS CATHETER;  Surgeon: Waynetta Sandy, MD;  Location: [place];  Service: Vascular;  Laterality: N/A;  . JOINT REPLACEMENT Left 2010   hip  . PARTIAL NEPHRECTOMY Left 2010  . PERIPHERAL VASCULAR INTERVENTION Left 02/10/2018   Procedure: PERIPHERAL VASCULAR INTERVENTION;  Surgeon: Serafina Mitchell, MD;  Location: [place] [facility];  Service: Cardiovascular;  Laterality: Left;  . TONSILLECTOMY      Family History  Problem Relation Age of Onset  . Dementia Mother   . Hypertension Father     Social History   Socioeconomic History  . Marital status: Soil scientist    Spouse name: Not on file  . Number of children: Not on file  . Years of education: Not on file  . Highest education level: Not on file  Occupational History  . Not on file  Tobacco Use  . Smoking status: Former Smoker    Types: Cigarettes    Quit date: 06/13/1986    Years since quitting: 34.6  . Smokeless tobacco: Current User    Types: Chew  Vaping Use  . Vaping Use:  Never used  Substance and Sexual Activity  . Alcohol use: No  . Drug use: No  . Sexual activity: Yes  Other Topics Concern  . Not on file  Social History Narrative  . Not on file   Social Determinants of Health   Financial Resource Strain: Not on file  Food Insecurity: Not on file  Transportation Needs: Not on file  Physical Activity: Not on file  Stress: Not on file  Social Connections: Not on file  Intimate Partner Violence: Not on file    Allergies  Allergen Reactions  . Penicillins Other (See Comments)    UNSPECIFIED REACTION Childhood allergy  Has patient had a PCN reaction causing immediate rash, facial/tongue/throat swelling, SOB or lightheadedness with hypotension: Unknown Has patient had a PCN reaction causing severe rash involving mucus membranes or skin necrosis: No Has patient had a PCN reaction that required hospitalization No Has patient had a PCN reaction occurring within the last 10 years: No If all of the above answers are "NO", then may proceed with Cephalosporin use.    . Doxazosin Mesylate     Other reaction(s): ineffective for BP (10/2014)  . Triamterene-Hctz     Other reaction(s): increased creatinine (see 09/2014 office note)    Current Outpatient Medications  Medication Sig Dispense Refill  . amLODipine (NORVASC) 10 MG tablet Take 10 mg by mouth daily.    Marland Kitchen atenolol (TENORMIN) 50 MG tablet Take 50 mg by mouth daily.    Marland Kitchen atorvastatin (LIPITOR) 10 MG tablet Take 10 mg by mouth daily.     . hydrALAZINE (APRESOLINE) 100 MG tablet Take 100 mg by mouth 2 (two) times daily.     . multivitamin (RENA-VIT) TABS tablet Take 1 tablet by mouth daily.     No current facility-administered medications for this visit.    REVIEW OF SYSTEMS:  [X]  denotes positive finding, [ ]  denotes negative finding Cardiac  Comments:  Chest pain or chest pressure:    Shortness of breath upon exertion:    Short of breath when lying flat:    Irregular heart rhythm:        Vascular    Pain in calf, thigh, or hip brought on by ambulation:    Pain in feet at night that wakes you up from your sleep:     Blood clot in your veins:    Leg swelling:         Pulmonary    Oxygen at home:    Productive cough:     Wheezing:         Neurologic    Sudden weakness in arms or legs:     Sudden numbness in arms or legs:     Sudden onset of difficulty speaking or slurred speech:    Temporary loss of vision in one eye:     Problems with dizziness:         Gastrointestinal    Blood in stool:     Vomited blood:         Genitourinary     Burning when urinating:     Blood in urine:        Psychiatric    Major depression:         Hematologic    Bleeding problems:    Problems with blood clotting too easily:        Skin    Rashes or ulcers:        Constitutional    Fever or chills:  PHYSICAL EXAM  Vitals:   01/09/21 1528  BP: 118/70  Pulse: 72  Resp: 20  Temp: 98.2 F (36.8 C)  SpO2: 98%  Weight: 231 lb (104.8 kg)  Height: 5\' 6"  (1.676 m)    Constitutional: well appearing. no distress. Appears well nourished.  Neurologic: CN intact. no focal findings. no sensory loss. Psychiatric: Mood and affect symmetric and appropriate. Eyes: No icterus. No conjunctival pallor. Ears, nose, throat: mucous membranes moist. Midline trachea.  Cardiac: regular rate and rhythm.  Respiratory: unlabored. Abdominal: soft, non-tender, non-distended.  Peripheral vascular:  2+ radial arteries  AVF thrombosed Extremity: No edema. No cyanosis. No pallor.  Skin: No gangrene. No ulceration.  Lymphatic: No Stemmer's sign. No palpable lymphadenopathy.  PERTINENT LABORATORY AND RADIOLOGIC DATA  Most recent CBC CBC Latest Ref Rng & Units 09/10/2018 06/09/2018 05/30/2018  WBC 4.0 - 10.5 K/uL - - 10.6(H)  Hemoglobin 13.0 - 17.0 g/dL 9.9(L) 9.5(L) 10.7(L)  Hematocrit 39.0 - 52.0 % 29.0(L) 28.0(L) 32.4(L)  Platelets 150 - 400 K/uL - - 162     Most recent CMP CMP Latest Ref Rng & Units 09/10/2018 06/09/2018 05/30/2018  Glucose 70 - 99 mg/dL 89 97 145(H)  BUN 8 - 23 mg/dL - - 76(H)  Creatinine 0.61 - 1.24 mg/dL - - 8.18(H)  Sodium 135 - 145 mmol/L 140 143 144  Potassium 3.5 - 5.1 mmol/L 3.7 3.7 3.2(L)  Chloride 98 - 111 mmol/L - - 106  CO2 22 - 32 mmol/L - - 24  Calcium 8.9 - 10.3 mg/dL - - 9.7  Total Protein 6.5 - 8.1 g/dL - - 6.7  Total Bilirubin 0.3 - 1.2 mg/dL - - 0.8  Alkaline Phos 38 - 126 U/L - - 46  AST 15 - 41 U/L - - 13(L)  ALT 0 - 44 U/L - - 12   Hgb A1c MFr Bld (%)  Date Value  05/30/2018 5.7 (H)   UPPER  EXTREMITY VEIN MAPPING   History: Evaluation prior to placement of dialysis access.      Patient has failed upper extremity AVF bilaterally. Right  cephalic vein      transposition 09/10/2018 and left brach-cephalic 86/57/8469.  Performing Technologist: Delorise Shiner RVT     Examination Guidelines: A complete evaluation includes B-mode imaging,  spectral  Doppler, color Doppler, and power Doppler as needed of all accessible  portions  of each vessel. Bilateral testing is considered an integral part of a  complete  examination. Limited examinations for reoccurring indications may be  performed  as noted.   +-----------------+-------------+----------+----------+  Right Cephalic  Diameter (cm)Depth (cm) Findings   +-----------------+-------------+----------+----------+  Shoulder       0.39        Thrombosed  +-----------------+-------------+----------+----------+  Prox upper arm    1.25        Thrombosed  +-----------------+-------------+----------+----------+  Mid upper arm    1.65        Thrombosed  +-----------------+-------------+----------+----------+  Dist upper arm              Thrombosed  +-----------------+-------------+----------+----------+  Antecubital fossa            Thrombosed  +-----------------+-------------+----------+----------+   +-----------------+-------------+----------+--------+  Right Basilic  Diameter (cm)Depth (cm)Findings  +-----------------+-------------+----------+--------+  Prox upper arm    0.83              +-----------------+-------------+----------+--------+  Mid upper arm    0.47              +-----------------+-------------+----------+--------+  Dist  upper arm    0.52              +-----------------+-------------+----------+--------+  Antecubital fossa  0.54               +-----------------+-------------+----------+--------+  Prox forearm     0.33              +-----------------+-------------+----------+--------+   +-----------------+-------------+----------+----------+  Left Cephalic  Diameter (cm)Depth (cm) Findings   +-----------------+-------------+----------+----------+  Shoulder       0.24        Thrombosed  +-----------------+-------------+----------+----------+  Prox upper arm    0.25        Thrombosed  +-----------------+-------------+----------+----------+  Mid upper arm    0.26        Thrombosed  +-----------------+-------------+----------+----------+  Dist upper arm    1.21        Thrombosed  +-----------------+-------------+----------+----------+  Antecubital fossa            Thrombosed  +-----------------+-------------+----------+----------+   +-----------------+-------------+----------+--------+  Left Basilic   Diameter (cm)Depth (cm)Findings  +-----------------+-------------+----------+--------+  Prox upper arm    0.55              +-----------------+-------------+----------+--------+  Mid upper arm    0.53              +-----------------+-------------+----------+--------+  Dist upper arm    0.57              +-----------------+-------------+----------+--------+  Antecubital fossa  0.58              +-----------------+-------------+----------+--------+  Prox forearm     0.27              +-----------------+-------------+----------+--------+   Summary: Right: Cephalic vein used as part of a previous AVF appears to be     thrombosed. Basilic vein is patent with diameters as     described above.  Left: Cephalic vein used as part of a previous AVF appears to be    thrombosed. Basilic vein is patent with  diameters as described    above.    Yevonne Aline. Stanford Breed, MD [facility] Phone Number: [phone] 01/09/2021 6:54 PM

## 2021-01-09 NOTE — Progress Notes (Signed)
[facility] OF [place]  ASSESSMENT / PLAN: 70 y.o. male with ESRD dialyzing via RIJ TDC. He has unfortunately thrombosed bilateral brachiocephalic arteriovenous fistulae.  He has suitable basilic vein in both arms for fistula creation.  We will plan for left upper extremity brachiobasilic AV fistula creation 01/11/2021.  CHIEF COMPLAINT: End-stage renal disease in need of HD access  HISTORY OF PRESENT ILLNESS: Peter [name] is a 70 y.o. male with end-stage renal disease dialyzing Monday Wednesday and Friday.  He is right-handed.  He has a right internal jugular tunneled dialysis catheter in place which is working well for him.  He has unfortunately thrombosed bilateral upper extremity brachiocephalic AV fistulae.  Past Medical History:  Diagnosis Date  . Anemia in chronic kidney disease (CKD)   . CKD (chronic kidney disease) stage 5, GFR less than 15 ml/min (HCC)   . Diabetes mellitus without complication ([place]) 24/5809   type II  . ED (erectile dysfunction)   . Heart murmur    not significant  . History of kidney stones    passed 1 , 1 was removed with kidney disease in 2010  . Hypercholesteremia   . Hypertension   . Iron deficiency   . Kidney stone 2010  . 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 OSA (obstructive sleep apnea)    AHI 38/hr on CPAP at 12cm h2o  . 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  . Syncope     Past Surgical History:  Procedure Laterality Date  . A/V FISTULAGRAM Left 02/10/2018   Procedure: A/V FISTULAGRAM;  Surgeon: Serafina Mitchell, MD;  Location: [facility];  Service: Cardiovascular;  Laterality: Left;  . AV FISTULA PLACEMENT Left 06/14/2016   Procedure: ARTERIOVENOUS (AV) FISTULA CREATION LEFT ARM;  Surgeon: Angelia Mould, MD;  Location: [place];  Service: Vascular;  Laterality: Left;  . AV FISTULA PLACEMENT Right 06/09/2018   Procedure: CREATION Right arm Brachiocephalic;   Surgeon: Waynetta Sandy, MD;  Location: [place];  Service: Vascular;  Laterality: Right;  . [place] TRANSPOSITION Right 09/10/2018   Procedure: CEPHALIC VEIN TRANSPOSITION;  Surgeon: Waynetta Sandy, MD;  Location: [place];  Service: Vascular;  Laterality: Right;  . COLONOSCOPY    . INSERTION OF DIALYSIS CATHETER N/A 06/09/2018   Procedure: INSERTION OF DIALYSIS CATHETER;  Surgeon: Waynetta Sandy, MD;  Location: [place];  Service: Vascular;  Laterality: N/A;  . JOINT REPLACEMENT Left 2010   hip  . PARTIAL NEPHRECTOMY Left 2010  . PERIPHERAL VASCULAR INTERVENTION Left 02/10/2018   Procedure: PERIPHERAL VASCULAR INTERVENTION;  Surgeon: Serafina Mitchell, MD;  Location: [facility];  Service: Cardiovascular;  Laterality: Left;  . TONSILLECTOMY      Family History  Problem Relation Age of Onset  . Dementia Mother   . Hypertension Father     Social History   Socioeconomic History  . Marital status: Soil scientist    Spouse name: Not on file  . Number of children: Not on file  . Years of education: Not on file  . Highest education level: Not on file  Occupational History  . Not on file  Tobacco Use  . Smoking status: Former Smoker    Types: Cigarettes    Quit date: 06/13/1986    Years since quitting: 34.6  . Smokeless tobacco: Current User    Types: Chew  Vaping Use  . Vaping Use:  Never used  Substance and Sexual Activity  . Alcohol use: No  . Drug use: No  . Sexual activity: Yes  Other Topics Concern  . Not on file  Social History Narrative  . Not on file   Social Determinants of Health   Financial Resource Strain: Not on file  Food Insecurity: Not on file  Transportation Needs: Not on file  Physical Activity: Not on file  Stress: Not on file  Social Connections: Not on file  Intimate Partner Violence: Not on file    Allergies  Allergen Reactions  . Penicillins Other (See Comments)    UNSPECIFIED REACTION Childhood allergy  Has patient had a PCN reaction causing immediate rash, facial/tongue/throat swelling, SOB or lightheadedness with hypotension: Unknown Has patient had a PCN reaction causing severe rash involving mucus membranes or skin necrosis: No Has patient had a PCN reaction that required hospitalization No Has patient had a PCN reaction occurring within the last 10 years: No If all of the above answers are "NO", then may proceed with Cephalosporin use.    . Doxazosin Mesylate     Other reaction(s): ineffective for BP (10/2014)  . Triamterene-Hctz     Other reaction(s): increased creatinine (see 09/2014 office note)    Current Outpatient Medications  Medication Sig Dispense Refill  . amLODipine (NORVASC) 10 MG tablet Take 10 mg by mouth daily.    Marland Kitchen atenolol (TENORMIN) 50 MG tablet Take 50 mg by mouth daily.    Marland Kitchen atorvastatin (LIPITOR) 10 MG tablet Take 10 mg by mouth daily.     . hydrALAZINE (APRESOLINE) 100 MG tablet Take 100 mg by mouth 2 (two) times daily.     . multivitamin (RENA-VIT) TABS tablet Take 1 tablet by mouth daily.     No current facility-administered medications for this visit.    REVIEW OF SYSTEMS:  [X]  denotes positive finding, [ ]  denotes negative finding Cardiac  Comments:  Chest pain or chest pressure:    Shortness of breath upon exertion:    Short of breath when lying flat:    Irregular heart rhythm:        Vascular    Pain in calf, thigh, or hip brought on by ambulation:    Pain in feet at night that wakes you up from your sleep:     Blood clot in your veins:    Leg swelling:         Pulmonary    Oxygen at home:    Productive cough:     Wheezing:         Neurologic    Sudden weakness in arms or legs:     Sudden numbness in arms or legs:     Sudden onset of difficulty speaking or slurred speech:    Temporary loss of vision in one eye:     Problems with dizziness:         Gastrointestinal    Blood in stool:     Vomited blood:         Genitourinary     Burning when urinating:     Blood in urine:        Psychiatric    Major depression:         Hematologic    Bleeding problems:    Problems with blood clotting too easily:        Skin    Rashes or ulcers:        Constitutional    Fever or chills:  PHYSICAL EXAM  Vitals:   01/09/21 1528  BP: 118/70  Pulse: 72  Resp: 20  Temp: 98.2 F (36.8 C)  SpO2: 98%  Weight: 231 lb (104.8 kg)  Height: 5\' 6"  (1.676 m)    Constitutional: well appearing. no distress. Appears well nourished.  Neurologic: CN intact. no focal findings. no sensory loss. Psychiatric: Mood and affect symmetric and appropriate. Eyes: No icterus. No conjunctival pallor. Ears, nose, throat: mucous membranes moist. Midline trachea.  Cardiac: regular rate and rhythm.  Respiratory: unlabored. Abdominal: soft, non-tender, non-distended.  Peripheral vascular:  2+ radial arteries  AVF thrombosed Extremity: No edema. No cyanosis. No pallor.  Skin: No gangrene. No ulceration.  Lymphatic: No Stemmer's sign. No palpable lymphadenopathy.  PERTINENT LABORATORY AND RADIOLOGIC DATA  Most recent CBC CBC Latest Ref Rng & Units 09/10/2018 06/09/2018 05/30/2018  WBC 4.0 - 10.5 K/uL - - 10.6(H)  Hemoglobin 13.0 - 17.0 g/dL 9.9(L) 9.5(L) 10.7(L)  Hematocrit 39.0 - 52.0 % 29.0(L) 28.0(L) 32.4(L)  Platelets 150 - 400 K/uL - - 162     Most recent CMP CMP Latest Ref Rng & Units 09/10/2018 06/09/2018 05/30/2018  Glucose 70 - 99 mg/dL 89 97 145(H)  BUN 8 - 23 mg/dL - - 76(H)  Creatinine 0.61 - 1.24 mg/dL - - 8.18(H)  Sodium 135 - 145 mmol/L 140 143 144  Potassium 3.5 - 5.1 mmol/L 3.7 3.7 3.2(L)  Chloride 98 - 111 mmol/L - - 106  CO2 22 - 32 mmol/L - - 24  Calcium 8.9 - 10.3 mg/dL - - 9.7  Total Protein 6.5 - 8.1 g/dL - - 6.7  Total Bilirubin 0.3 - 1.2 mg/dL - - 0.8  Alkaline Phos 38 - 126 U/L - - 46  AST 15 - 41 U/L - - 13(L)  ALT 0 - 44 U/L - - 12   Hgb A1c MFr Bld (%)  Date Value  05/30/2018 5.7 (H)   UPPER  EXTREMITY VEIN MAPPING   History: Evaluation prior to placement of dialysis access.      Patient has failed upper extremity AVF bilaterally. Right  cephalic vein      transposition 09/10/2018 and left brach-cephalic 60/73/7106.  Performing Technologist: Delorise Shiner RVT     Examination Guidelines: A complete evaluation includes B-mode imaging,  spectral  Doppler, color Doppler, and power Doppler as needed of all accessible  portions  of each vessel. Bilateral testing is considered an integral part of a  complete  examination. Limited examinations for reoccurring indications may be  performed  as noted.   +-----------------+-------------+----------+----------+  Right Cephalic  Diameter (cm)Depth (cm) Findings   +-----------------+-------------+----------+----------+  Shoulder       0.39        Thrombosed  +-----------------+-------------+----------+----------+  Prox upper arm    1.25        Thrombosed  +-----------------+-------------+----------+----------+  Mid upper arm    1.65        Thrombosed  +-----------------+-------------+----------+----------+  Dist upper arm              Thrombosed  +-----------------+-------------+----------+----------+  Antecubital fossa            Thrombosed  +-----------------+-------------+----------+----------+   +-----------------+-------------+----------+--------+  Right Basilic  Diameter (cm)Depth (cm)Findings  +-----------------+-------------+----------+--------+  Prox upper arm    0.83              +-----------------+-------------+----------+--------+  Mid upper arm    0.47              +-----------------+-------------+----------+--------+  Dist  upper arm    0.52              +-----------------+-------------+----------+--------+  Antecubital fossa  0.54               +-----------------+-------------+----------+--------+  Prox forearm     0.33              +-----------------+-------------+----------+--------+   +-----------------+-------------+----------+----------+  Left Cephalic  Diameter (cm)Depth (cm) Findings   +-----------------+-------------+----------+----------+  Shoulder       0.24        Thrombosed  +-----------------+-------------+----------+----------+  Prox upper arm    0.25        Thrombosed  +-----------------+-------------+----------+----------+  Mid upper arm    0.26        Thrombosed  +-----------------+-------------+----------+----------+  Dist upper arm    1.21        Thrombosed  +-----------------+-------------+----------+----------+  Antecubital fossa            Thrombosed  +-----------------+-------------+----------+----------+   +-----------------+-------------+----------+--------+  Left Basilic   Diameter (cm)Depth (cm)Findings  +-----------------+-------------+----------+--------+  Prox upper arm    0.55              +-----------------+-------------+----------+--------+  Mid upper arm    0.53              +-----------------+-------------+----------+--------+  Dist upper arm    0.57              +-----------------+-------------+----------+--------+  Antecubital fossa  0.58              +-----------------+-------------+----------+--------+  Prox forearm     0.27              +-----------------+-------------+----------+--------+   Summary: Right: Cephalic vein used as part of a previous AVF appears to be     thrombosed. Basilic vein is patent with diameters as     described above.  Left: Cephalic vein used as part of a previous AVF appears to be    thrombosed. Basilic vein is patent with  diameters as described    above.    Yevonne Aline. Stanford Breed, MD [facility] Phone Number: [phone] 01/09/2021 6:54 PM

## 2021-01-10 DIAGNOSIS — N2581 Secondary hyperparathyroidism of renal origin: Secondary | ICD-10-CM | POA: Diagnosis not present

## 2021-01-10 DIAGNOSIS — Z992 Dependence on renal dialysis: Secondary | ICD-10-CM | POA: Diagnosis not present

## 2021-01-10 DIAGNOSIS — E876 Hypokalemia: Secondary | ICD-10-CM | POA: Diagnosis not present

## 2021-01-10 DIAGNOSIS — N186 End stage renal disease: Secondary | ICD-10-CM | POA: Diagnosis not present

## 2021-01-10 DIAGNOSIS — D689 Coagulation defect, unspecified: Secondary | ICD-10-CM | POA: Diagnosis not present

## 2021-01-10 DIAGNOSIS — T8249XA Other complication of vascular dialysis catheter, initial encounter: Secondary | ICD-10-CM | POA: Diagnosis not present

## 2021-01-12 DIAGNOSIS — N186 End stage renal disease: Secondary | ICD-10-CM | POA: Diagnosis not present

## 2021-01-12 DIAGNOSIS — N2581 Secondary hyperparathyroidism of renal origin: Secondary | ICD-10-CM | POA: Diagnosis not present

## 2021-01-12 DIAGNOSIS — D689 Coagulation defect, unspecified: Secondary | ICD-10-CM | POA: Diagnosis not present

## 2021-01-12 DIAGNOSIS — T8249XA Other complication of vascular dialysis catheter, initial encounter: Secondary | ICD-10-CM | POA: Diagnosis not present

## 2021-01-12 DIAGNOSIS — E876 Hypokalemia: Secondary | ICD-10-CM | POA: Diagnosis not present

## 2021-01-12 DIAGNOSIS — Z992 Dependence on renal dialysis: Secondary | ICD-10-CM | POA: Diagnosis not present

## 2021-01-15 DIAGNOSIS — D689 Coagulation defect, unspecified: Secondary | ICD-10-CM | POA: Diagnosis not present

## 2021-01-15 DIAGNOSIS — Z992 Dependence on renal dialysis: Secondary | ICD-10-CM | POA: Diagnosis not present

## 2021-01-15 DIAGNOSIS — N186 End stage renal disease: Secondary | ICD-10-CM | POA: Diagnosis not present

## 2021-01-15 DIAGNOSIS — N2581 Secondary hyperparathyroidism of renal origin: Secondary | ICD-10-CM | POA: Diagnosis not present

## 2021-01-15 DIAGNOSIS — T8249XA Other complication of vascular dialysis catheter, initial encounter: Secondary | ICD-10-CM | POA: Diagnosis not present

## 2021-01-17 DIAGNOSIS — T8249XA Other complication of vascular dialysis catheter, initial encounter: Secondary | ICD-10-CM | POA: Diagnosis not present

## 2021-01-17 DIAGNOSIS — Z992 Dependence on renal dialysis: Secondary | ICD-10-CM | POA: Diagnosis not present

## 2021-01-17 DIAGNOSIS — N2581 Secondary hyperparathyroidism of renal origin: Secondary | ICD-10-CM | POA: Diagnosis not present

## 2021-01-17 DIAGNOSIS — D689 Coagulation defect, unspecified: Secondary | ICD-10-CM | POA: Diagnosis not present

## 2021-01-17 DIAGNOSIS — N186 End stage renal disease: Secondary | ICD-10-CM | POA: Diagnosis not present

## 2021-01-19 DIAGNOSIS — Z992 Dependence on renal dialysis: Secondary | ICD-10-CM | POA: Diagnosis not present

## 2021-01-19 DIAGNOSIS — N2581 Secondary hyperparathyroidism of renal origin: Secondary | ICD-10-CM | POA: Diagnosis not present

## 2021-01-19 DIAGNOSIS — D689 Coagulation defect, unspecified: Secondary | ICD-10-CM | POA: Diagnosis not present

## 2021-01-19 DIAGNOSIS — T8249XA Other complication of vascular dialysis catheter, initial encounter: Secondary | ICD-10-CM | POA: Diagnosis not present

## 2021-01-19 DIAGNOSIS — N186 End stage renal disease: Secondary | ICD-10-CM | POA: Diagnosis not present

## 2021-01-22 DIAGNOSIS — N2581 Secondary hyperparathyroidism of renal origin: Secondary | ICD-10-CM | POA: Diagnosis not present

## 2021-01-22 DIAGNOSIS — N186 End stage renal disease: Secondary | ICD-10-CM | POA: Diagnosis not present

## 2021-01-22 DIAGNOSIS — Z992 Dependence on renal dialysis: Secondary | ICD-10-CM | POA: Diagnosis not present

## 2021-01-22 DIAGNOSIS — E876 Hypokalemia: Secondary | ICD-10-CM | POA: Diagnosis not present

## 2021-01-22 DIAGNOSIS — T8249XA Other complication of vascular dialysis catheter, initial encounter: Secondary | ICD-10-CM | POA: Diagnosis not present

## 2021-01-22 DIAGNOSIS — D689 Coagulation defect, unspecified: Secondary | ICD-10-CM | POA: Diagnosis not present

## 2021-01-23 ENCOUNTER — Other Ambulatory Visit: Payer: Self-pay

## 2021-01-23 ENCOUNTER — Other Ambulatory Visit
Admission: RE | Admit: 2021-01-23 | Discharge: 2021-01-23 | Disposition: A | Discharge status: Home / Self Care | Payer: Medicare Other | Source: Ambulatory Visit | Attending: Vascular Surgery | Admitting: Vascular Surgery

## 2021-01-23 DIAGNOSIS — Z20822 Contact with and (suspected) exposure to covid-19: Secondary | ICD-10-CM | POA: Diagnosis not present

## 2021-01-23 DIAGNOSIS — Z01812 Encounter for preprocedural laboratory examination: Secondary | ICD-10-CM | POA: Insufficient documentation

## 2021-01-24 ENCOUNTER — Other Ambulatory Visit: Payer: Self-pay

## 2021-01-24 ENCOUNTER — Encounter (HOSPITAL_COMMUNITY): Payer: Self-pay | Admitting: Vascular Surgery

## 2021-01-24 DIAGNOSIS — D689 Coagulation defect, unspecified: Secondary | ICD-10-CM | POA: Diagnosis not present

## 2021-01-24 DIAGNOSIS — N186 End stage renal disease: Secondary | ICD-10-CM | POA: Diagnosis not present

## 2021-01-24 DIAGNOSIS — T8249XA Other complication of vascular dialysis catheter, initial encounter: Secondary | ICD-10-CM | POA: Diagnosis not present

## 2021-01-24 DIAGNOSIS — N2581 Secondary hyperparathyroidism of renal origin: Secondary | ICD-10-CM | POA: Diagnosis not present

## 2021-01-24 DIAGNOSIS — E876 Hypokalemia: Secondary | ICD-10-CM | POA: Diagnosis not present

## 2021-01-24 DIAGNOSIS — Z992 Dependence on renal dialysis: Secondary | ICD-10-CM | POA: Diagnosis not present

## 2021-01-24 LAB — SARS CORONAVIRUS 2 (TAT 6-24 HRS): SARS Coronavirus 2: NEGATIVE

## 2021-01-24 NOTE — Anesthesia Preprocedure Evaluation (Addendum)
Anesthesia Evaluation  Patient identified by MRN, date of birth, ID band Patient awake    Reviewed: Allergy & Precautions, NPO status , Patient's Chart, lab work & pertinent test results  History of Anesthesia Complications Negative for: history of anesthetic complications  Airway Mallampati: II  TM Distance: >3 FB Neck ROM: Full    Dental  (+) Missing,    Pulmonary sleep apnea , former smoker,    Pulmonary exam normal        Cardiovascular hypertension, Pt. on medications Normal cardiovascular exam  TTE 2019: EF 60-65%, mild LVE, moderate LVH, grade 2 DD, moderate MVP/MR, mod-severe LAE, mild RAE   Neuro/Psych negative neurological ROS  negative psych ROS   GI/Hepatic negative GI ROS, Neg liver ROS,   Endo/Other  diabetes, Type 2BMI 38  Renal/GU ESRF and DialysisRenal disease (HD M/W/F)  negative genitourinary   Musculoskeletal negative musculoskeletal ROS (+)   Abdominal   Peds  Hematology  (+) anemia ,   Anesthesia Other Findings Day of surgery medications reviewed with patient.  Reproductive/Obstetrics negative OB ROS                            Anesthesia Physical Anesthesia Plan  ASA: III  Anesthesia Plan: Regional and MAC   Post-op Pain Management:    Induction:   PONV Risk Score and Plan: 1 and Propofol infusion, Treatment may vary due to age or medical condition and Ondansetron  Airway Management Planned: Natural Airway and Simple Face Mask  Additional Equipment: None  Intra-op Plan:   Post-operative Plan:   Informed Consent: I have reviewed the patients History and Physical, chart, labs and discussed the procedure including the risks, benefits and alternatives for the proposed anesthesia with the patient or authorized representative who has indicated his/her understanding and acceptance.     Dental advisory given  Plan Discussed with: CRNA  Anesthesia Plan  Comments:        Anesthesia Quick Evaluation

## 2021-01-24 NOTE — Progress Notes (Signed)
Patient denies shortness of breath, fever, cough or chest pain.  PCP - Dr Gaynelle Arabian Cardiologist - n/a  Chest x-ray - n/a EKG - DOS 01/25/21 Stress Test - n/a ECHO - 05/30/18 Cardiac Cath - n/a  Sleep Study -  Yes CPAP - does not use cpap DM2 - diet controlled, no meds, does not check blood sugar  Anesthesia review: Yes  STOP now taking any Aspirin (unless otherwise instructed by your surgeon), Aleve, Naproxen, Ibuprofen, Motrin, Advil, Goody's, BC's, all herbal medications, fish oil, and all vitamins.   Coronavirus Screening Covid test on 01/23/21 was negative.  Patient verbalized understanding of instructions that were given via phone.

## 2021-01-25 ENCOUNTER — Ambulatory Visit (HOSPITAL_COMMUNITY): Payer: Medicare Other | Admitting: Physician Assistant

## 2021-01-25 ENCOUNTER — Ambulatory Visit (HOSPITAL_COMMUNITY)
Admission: RE | Admit: 2021-01-25 | Discharge: 2021-01-25 | Disposition: A | Discharge status: Home / Self Care | Payer: Medicare Other | Attending: Vascular Surgery | Admitting: Vascular Surgery

## 2021-01-25 ENCOUNTER — Other Ambulatory Visit: Payer: Self-pay

## 2021-01-25 ENCOUNTER — Encounter (HOSPITAL_COMMUNITY): Payer: Self-pay | Admitting: Vascular Surgery

## 2021-01-25 ENCOUNTER — Encounter (HOSPITAL_COMMUNITY)
Admission: RE | Disposition: A | Discharge status: Home / Self Care | Payer: Self-pay | Source: Home / Self Care | Attending: Vascular Surgery

## 2021-01-25 DIAGNOSIS — Z85528 Personal history of other malignant neoplasm of kidney: Secondary | ICD-10-CM | POA: Diagnosis not present

## 2021-01-25 DIAGNOSIS — Z79899 Other long term (current) drug therapy: Secondary | ICD-10-CM | POA: Insufficient documentation

## 2021-01-25 DIAGNOSIS — X58XXXA Exposure to other specified factors, initial encounter: Secondary | ICD-10-CM | POA: Insufficient documentation

## 2021-01-25 DIAGNOSIS — N186 End stage renal disease: Secondary | ICD-10-CM | POA: Diagnosis not present

## 2021-01-25 DIAGNOSIS — T82868A Thrombosis of vascular prosthetic devices, implants and grafts, initial encounter: Secondary | ICD-10-CM | POA: Diagnosis not present

## 2021-01-25 DIAGNOSIS — Z87891 Personal history of nicotine dependence: Secondary | ICD-10-CM | POA: Diagnosis not present

## 2021-01-25 DIAGNOSIS — Z905 Acquired absence of kidney: Secondary | ICD-10-CM | POA: Insufficient documentation

## 2021-01-25 DIAGNOSIS — I120 Hypertensive chronic kidney disease with stage 5 chronic kidney disease or end stage renal disease: Secondary | ICD-10-CM | POA: Diagnosis not present

## 2021-01-25 DIAGNOSIS — Z87442 Personal history of urinary calculi: Secondary | ICD-10-CM | POA: Diagnosis not present

## 2021-01-25 DIAGNOSIS — N185 Chronic kidney disease, stage 5: Secondary | ICD-10-CM | POA: Diagnosis not present

## 2021-01-25 DIAGNOSIS — G4733 Obstructive sleep apnea (adult) (pediatric): Secondary | ICD-10-CM | POA: Diagnosis not present

## 2021-01-25 DIAGNOSIS — E1122 Type 2 diabetes mellitus with diabetic chronic kidney disease: Secondary | ICD-10-CM | POA: Insufficient documentation

## 2021-01-25 DIAGNOSIS — Z8249 Family history of ischemic heart disease and other diseases of the circulatory system: Secondary | ICD-10-CM | POA: Insufficient documentation

## 2021-01-25 DIAGNOSIS — Z880 Allergy status to penicillin: Secondary | ICD-10-CM | POA: Insufficient documentation

## 2021-01-25 DIAGNOSIS — Z992 Dependence on renal dialysis: Secondary | ICD-10-CM | POA: Diagnosis not present

## 2021-01-25 HISTORY — PX: BASCILIC VEIN TRANSPOSITION: SHX5742

## 2021-01-25 LAB — POCT I-STAT, CHEM 8
BUN: 20 mg/dL (ref 8–23)
Calcium, Ion: 1.21 mmol/L (ref 1.15–1.40)
Chloride: 101 mmol/L (ref 98–111)
Creatinine, Ser: 5.7 mg/dL — ABNORMAL HIGH (ref 0.61–1.24)
Glucose, Bld: 113 mg/dL — ABNORMAL HIGH (ref 70–99)
HCT: 35 % — ABNORMAL LOW (ref 39.0–52.0)
Hemoglobin: 11.9 g/dL — ABNORMAL LOW (ref 13.0–17.0)
Potassium: 4 mmol/L (ref 3.5–5.1)
Sodium: 136 mmol/L (ref 135–145)
TCO2: 24 mmol/L (ref 22–32)

## 2021-01-25 LAB — GLUCOSE, CAPILLARY
Glucose-Capillary: 105 mg/dL — ABNORMAL HIGH (ref 70–99)
Glucose-Capillary: 107 mg/dL — ABNORMAL HIGH (ref 70–99)

## 2021-01-25 SURGERY — TRANSPOSITION, VEIN, BASILIC
Anesthesia: Monitor Anesthesia Care | Site: Arm Upper | Laterality: Left

## 2021-01-25 MED ORDER — FENTANYL CITRATE (PF) 250 MCG/5ML IJ SOLN
INTRAMUSCULAR | Status: AC
  Filled 2021-01-25: qty 5

## 2021-01-25 MED ORDER — HEPARIN SODIUM (PORCINE) 1000 UNIT/ML IJ SOLN
INTRAMUSCULAR | Status: DC | PRN
  Administered 2021-01-25: 5000 [IU] via INTRAVENOUS

## 2021-01-25 MED ORDER — ONDANSETRON HCL 4 MG/2ML IJ SOLN
INTRAMUSCULAR | Status: DC | PRN
  Administered 2021-01-25: 4 mg via INTRAVENOUS

## 2021-01-25 MED ORDER — FENTANYL CITRATE (PF) 250 MCG/5ML IJ SOLN
INTRAMUSCULAR | Status: DC | PRN
  Administered 2021-01-25: 50 ug via INTRAVENOUS

## 2021-01-25 MED ORDER — MIDAZOLAM HCL 2 MG/2ML IJ SOLN
INTRAMUSCULAR | Status: DC | PRN
  Administered 2021-01-25 (×2): 1 mg via INTRAVENOUS

## 2021-01-25 MED ORDER — OXYCODONE-ACETAMINOPHEN 5-325 MG PO TABS: 1.0000 | ORAL_TABLET | ORAL | 0 refills | Status: DC | PRN

## 2021-01-25 MED ORDER — SODIUM CHLORIDE 0.9 % IV SOLN
INTRAVENOUS | Status: DC | PRN
  Administered 2021-01-25: 500 mL

## 2021-01-25 MED ORDER — PROPOFOL 500 MG/50ML IV EMUL
INTRAVENOUS | Status: DC | PRN
  Administered 2021-01-25: 40 ug/kg/min via INTRAVENOUS

## 2021-01-25 MED ORDER — SODIUM CHLORIDE 0.9 % IV SOLN
INTRAVENOUS | Status: AC
  Filled 2021-01-25: qty 1.2

## 2021-01-25 MED ORDER — MIDAZOLAM HCL 2 MG/2ML IJ SOLN
INTRAMUSCULAR | Status: AC
  Filled 2021-01-25: qty 2

## 2021-01-25 MED ORDER — ACETAMINOPHEN 500 MG PO TABS
1000.0000 mg | ORAL_TABLET | Freq: Once | ORAL | Status: AC
  Administered 2021-01-25: 1000 mg via ORAL
  Filled 2021-01-25: qty 2

## 2021-01-25 MED ORDER — EPINEPHRINE PF 1 MG/ML IJ SOLN
INTRAMUSCULAR | Status: DC | PRN
  Administered 2021-01-25: .15 mg via SUBCUTANEOUS

## 2021-01-25 MED ORDER — MEPIVACAINE HCL (PF) 2 % IJ SOLN
INTRAMUSCULAR | Status: DC | PRN
  Administered 2021-01-25: 30 mL

## 2021-01-25 MED ORDER — CHLORHEXIDINE GLUCONATE 0.12 % MT SOLN
15.0000 mL | Freq: Once | OROMUCOSAL | Status: AC
  Administered 2021-01-25: 15 mL via OROMUCOSAL
  Filled 2021-01-25 (×2): qty 15

## 2021-01-25 MED ORDER — LIDOCAINE 2% (20 MG/ML) 5 ML SYRINGE
INTRAMUSCULAR | Status: DC | PRN
  Administered 2021-01-25: 20 mg via INTRAVENOUS

## 2021-01-25 MED ORDER — 0.9 % SODIUM CHLORIDE (POUR BTL) OPTIME
TOPICAL | Status: DC | PRN
  Administered 2021-01-25: 1000 mL

## 2021-01-25 MED ORDER — CHLORHEXIDINE GLUCONATE 4 % EX LIQD: 60.0000 mL | Freq: Once | CUTANEOUS | Status: DC

## 2021-01-25 MED ORDER — LIDOCAINE HCL (PF) 1 % IJ SOLN
INTRAMUSCULAR | Status: AC
  Filled 2021-01-25: qty 30

## 2021-01-25 MED ORDER — VANCOMYCIN HCL 1500 MG/300ML IV SOLN
1500.0000 mg | INTRAVENOUS | Status: AC
  Administered 2021-01-25: 1500 mg via INTRAVENOUS
  Filled 2021-01-25: qty 300

## 2021-01-25 MED ORDER — SODIUM CHLORIDE 0.9 % IV SOLN: INTRAVENOUS | Status: DC

## 2021-01-25 MED ORDER — FENTANYL CITRATE (PF) 100 MCG/2ML IJ SOLN: 25.0000 ug | INTRAMUSCULAR | Status: DC | PRN

## 2021-01-25 MED ORDER — PROMETHAZINE HCL 25 MG/ML IJ SOLN: 6.2500 mg | INTRAMUSCULAR | Status: DC | PRN

## 2021-01-25 SURGICAL SUPPLY — 36 items
ADH SKN CLS APL DERMABOND .7 (GAUZE/BANDAGES/DRESSINGS) ×1
ARMBAND PINK RESTRICT EXTREMIT (MISCELLANEOUS) ×3 IMPLANT
CANISTER SUCT 3000ML PPV (MISCELLANEOUS) ×3 IMPLANT
CLIP VESOCCLUDE MED 24/CT (CLIP) IMPLANT
CLIP VESOCCLUDE MED 6/CT (CLIP) ×3 IMPLANT
CLIP VESOCCLUDE SM WIDE 24/CT (CLIP) IMPLANT
CLIP VESOCCLUDE SM WIDE 6/CT (CLIP) ×3 IMPLANT
COVER PROBE W GEL 5X96 (DRAPES) ×3 IMPLANT
COVER WAND RF STERILE (DRAPES) IMPLANT
DERMABOND ADVANCED (GAUZE/BANDAGES/DRESSINGS) ×2
DERMABOND ADVANCED .7 DNX12 (GAUZE/BANDAGES/DRESSINGS) ×1 IMPLANT
ELECT REM PT RETURN 9FT ADLT (ELECTROSURGICAL) ×3
ELECTRODE REM PT RTRN 9FT ADLT (ELECTROSURGICAL) ×1 IMPLANT
GLOVE BIOGEL PI IND STRL 7.5 (GLOVE) ×1 IMPLANT
GLOVE BIOGEL PI INDICATOR 7.5 (GLOVE) ×2
GLOVE SURG SS PI 7.5 STRL IVOR (GLOVE) ×3 IMPLANT
GOWN STRL REUS W/ TWL LRG LVL3 (GOWN DISPOSABLE) ×2 IMPLANT
GOWN STRL REUS W/ TWL XL LVL3 (GOWN DISPOSABLE) ×1 IMPLANT
GOWN STRL REUS W/TWL LRG LVL3 (GOWN DISPOSABLE) ×6
GOWN STRL REUS W/TWL XL LVL3 (GOWN DISPOSABLE) ×3
HEMOSTAT SNOW SURGICEL 2X4 (HEMOSTASIS) IMPLANT
KIT BASIN OR (CUSTOM PROCEDURE TRAY) ×3 IMPLANT
KIT TURNOVER KIT B (KITS) ×3 IMPLANT
NS IRRIG 1000ML POUR BTL (IV SOLUTION) ×3 IMPLANT
PACK CV ACCESS (CUSTOM PROCEDURE TRAY) ×3 IMPLANT
PAD ARMBOARD 7.5X6 YLW CONV (MISCELLANEOUS) ×6 IMPLANT
SUT MNCRL AB 4-0 PS2 18 (SUTURE) ×3 IMPLANT
SUT PROLENE 6 0 BV (SUTURE) ×9 IMPLANT
SUT PROLENE 6 0 CC (SUTURE) IMPLANT
SUT SILK 2 0 SH (SUTURE) IMPLANT
SUT VIC AB 3-0 SH 27 (SUTURE) ×3
SUT VIC AB 3-0 SH 27X BRD (SUTURE) ×1 IMPLANT
SUT VICRYL 4-0 PS2 18IN ABS (SUTURE) IMPLANT
TOWEL GREEN STERILE (TOWEL DISPOSABLE) ×3 IMPLANT
UNDERPAD 30X36 HEAVY ABSORB (UNDERPADS AND DIAPERS) ×3 IMPLANT
WATER STERILE IRR 1000ML POUR (IV SOLUTION) ×3 IMPLANT

## 2021-01-25 NOTE — Anesthesia Procedure Notes (Signed)
Anesthesia Regional Block: Supraclavicular block   Pre-Anesthetic Checklist: ,, timeout performed, Correct Patient, Correct Site, Correct Laterality, Correct Procedure, Correct Position, site marked, Risks and benefits discussed, pre-op evaluation,  At surgeon's request and post-op pain management  Laterality: Left  Prep: Maximum Sterile Barrier Precautions used, chloraprep       Needles:  Injection technique: Single-shot  Needle Type: Echogenic Stimulator Needle     Needle Length: 9cm  Needle Gauge: 22     Additional Needles:   Procedures:,,,, ultrasound used (permanent image in chart),,,,  Narrative:  Start time: 01/25/2021 7:05 AM End time: 01/25/2021 7:08 AM Injection made incrementally with aspirations every 5 mL.  Performed by: Personally  Anesthesiologist: Brennan Bailey, MD  Additional Notes: Risks, benefits, and alternative discussed. Patient gave consent for procedure. Patient prepped and draped in sterile fashion. Sedation administered, patient remains easily responsive to voice. Relevant anatomy identified with ultrasound guidance. Local anesthetic given in 5cc increments with no signs or symptoms of intravascular injection. No pain or paraesthesias with injection. Patient monitored throughout procedure with signs of LAST or immediate complications. Tolerated well. Ultrasound image placed in chart.  Tawny Asal, MD

## 2021-01-25 NOTE — Op Note (Signed)
DATE OF SERVICE: 01/25/2021  PATIENT:  Peter [name]  70 y.o. male  PRE-OPERATIVE DIAGNOSIS:  ESRD  POST-OPERATIVE DIAGNOSIS:  Same  PROCEDURE:   Left first stage brachiobasilic arteriovenous fistula creation  SURGEON:  Surgeon(s) and Role:    * [name], MD - Primary  ASSISTANT: Paulo Fruit, PA-C  An assistant was required to facilitate exposure and expedite the case.  ANESTHESIA:   regional and MAC  EBL: min  BLOOD ADMINISTERED:none  DRAINS: none   LOCAL MEDICATIONS USED:  NONE  SPECIMEN:  none  COUNTS: confirmed correct .  TOURNIQUET:  None  PATIENT DISPOSITION:  PACU - hemodynamically stable.   Delay start of Pharmacological VTE agent (>24hrs) due to surgical blood loss or risk of bleeding: no  INDICATION FOR PROCEDURE: Junaid Wurzer is a 70 y.o. male with ESRD dialyzing via RIJ TDC. He underwent bilateral brachiocephalic AVF creation. These have unfortunately thrombosed. After careful discussion of risks, benefits, and alternatives the patient was offered left brachiobasilic arteriovenous fistula creation. We specifically discussed risk of steal syndrome. The patient understood and wished to proceed.  OPERATIVE FINDINGS: healthy brachial artery and vein suitable for AVF creation  DESCRIPTION OF PROCEDURE: After identification of the patient in the pre-operative holding area, the patient was transferred to the operating room. The patient was positioned supine on the operating room table. Anesthesia was induced. The left arm was prepped and draped in standard fashion. A surgical pause was performed confirming correct patient, procedure, and operative location.  Using intraoperative ultrasound the left brachial artery and basilic vein were mapped.  A curvilinear incision was planned over the course of the two vessels to allow fistula creation.  Incision was created.  Incision was carried down through subcutaneous tissue.  The aponeurosis of the biceps  tendon was divided.  The brachial sheath was identified.  The brachial artery was skeletonized.  The artery was encircled with 2 Silastic Vesseloops.  Next attention was turned to the basilic vein.  This was identified in the medial arm in its typical position.  The vein was mobilized throughout the length of the incision to allow tension-free arteriovenous fistula creation.  The distal end of the vein was clamped with a right angle.  The proximal end of the vein was clamped with a bulldog.  The vein was transected distally.  The stump was oversewn with a 2-0 silk.  The cut end of the vein was spatulated and distended with a mosquito clamp.  Patient was systemically heparinized with 3000 units of IV heparin.  After a three minute pause, the brachial artery was clamped proximally distally.  The basilic vein was anastomosed to the brachial artery into side using continuous running suture of 6-0 Prolene.  Immediately prior to completion the anastomosis was flushed and de-aired.  The anastomosis was completed.  Clamps were released.  Hemostasis was achieved.  An audible bruit was heard in the fistula.  Palpable pulse was felt in the left wrist.  Stasis was achieved in the surgical bed.  The wound was closed with 3-0 Vicryl and 4-0 Monocryl.  Upon completion of the case instrument and sharps counts were confirmed correct. The patient was transferred to the PACU in good condition. I was present for all portions of the procedure.  Yevonne Aline. Stanford Breed, MD [facility] Phone Number: [phone] 01/25/2021 8:57 AM

## 2021-01-25 NOTE — Discharge Instructions (Signed)
[facility]  Discharge Instructions  AV Fistula or Graft Surgery for Dialysis Access  Please refer to the following instructions for your post-procedure care. Your surgeon or physician assistant will discuss any changes with you.  Activity  You may drive the day following your surgery, if you are comfortable and no longer taking prescription pain medication. Resume full activity as the soreness in your incision resolves.  Bathing/Showering  You may shower after you go home. Keep your incision dry for 48 hours. Do not soak in a bathtub, hot tub, or swim until the incision heals completely. You may not shower if you have a hemodialysis catheter.  Incision Care  Clean your incision with mild soap and water after 48 hours. Pat the area dry with a clean towel. You do not need a bandage unless otherwise instructed. Do not apply any ointments or creams to your incision. You may have skin glue on your incision. Do not peel it off. It will come off on its own in about one week. Your arm may swell a bit after surgery. To reduce swelling use pillows to elevate your arm so it is above your heart. Your doctor will tell you if you need to lightly wrap your arm with an ACE bandage.  Diet  Resume your normal diet. There are not special food restrictions following this procedure. In order to heal from your surgery, it is CRITICAL to get adequate nutrition. Your body requires vitamins, minerals, and protein. Vegetables are the best source of vitamins and minerals. Vegetables also provide the perfect balance of protein. Processed food has little nutritional value, so try to avoid this.  Medications  Resume taking all of your medications. If your incision is causing pain, you may take over-the counter pain relievers such as acetaminophen (Tylenol). If you were prescribed a stronger pain medication, please be aware these medications can cause nausea and constipation. Prevent  nausea by taking the medication with a snack or meal. Avoid constipation by drinking plenty of fluids and eating foods with high amount of fiber, such as fruits, vegetables, and grains.  Do not take Tylenol if you are taking prescription pain medications.  Follow up Your surgeon may want to see you in the office following your access surgery. If so, this will be arranged at the time of your surgery.  Please call us immediately for any of the following conditions:  . Increased pain, redness, drainage (pus) from your incision site . Fever of 101 degrees or higher . Severe or worsening pain at your incision site . Hand pain or numbness. .  Reduce your risk of vascular disease:  . Stop smoking. If you would like help, call [facility] at 1-800-QUIT-NOW (804) 669-2271) or [place] at 604-050-9417  . Manage your cholesterol . Maintain a desired weight . Control your diabetes . Keep your blood pressure down  Dialysis  It will take several weeks to several months for your new dialysis access to be ready for use. Your surgeon will determine when it is okay to use it. Your nephrologist will continue to direct your dialysis. You can continue to use your Permcath until your new access is ready for use.   01/25/2021 Peter [name] [id number] 08-Dec-1950  Surgeon(s): [name], MD  Procedure(s): LEFT ARMFIRST STAGE BASCILIC VEIN TRANSPOSITION   May stick graft immediately   May stick graft on designated area only:   X Do not stick left AV fistula for 12 weeks  If you have any questions, please call the office at [phone].

## 2021-01-25 NOTE — Anesthesia Postprocedure Evaluation (Signed)
Anesthesia Post Note  Patient: Peter [name]  Procedure(s) Performed: LEFT ARMFIRST STAGE BASCILIC VEIN TRANSPOSITION (Left Arm Upper)     Patient location during evaluation: PACU Anesthesia Type: Regional and MAC Level of consciousness: awake and alert and oriented Pain management: pain level controlled Vital Signs Assessment: post-procedure vital signs reviewed and stable Respiratory status: spontaneous breathing, nonlabored ventilation and respiratory function stable Cardiovascular status: blood pressure returned to baseline Postop Assessment: no apparent nausea or vomiting Anesthetic complications: no   No complications documented.  Last Vitals:  Vitals:   01/25/21 0920 01/25/21 0932  BP: 128/71 (!) 147/78  Pulse: 67 67  Resp: 14 14  Temp:  (!) 36.2 C  SpO2: 99% 99%    Last Pain:  Vitals:   01/25/21 0932  TempSrc:   PainSc: 0-No pain                 Brennan Bailey

## 2021-01-25 NOTE — Transfer of Care (Signed)
Immediate Anesthesia Transfer of Care Note  Patient: Peter [name]  Procedure(s) Performed: LEFT ARMFIRST STAGE BASCILIC VEIN TRANSPOSITION (Left Arm Upper)  Patient Location: PACU  Anesthesia Type:MAC combined with regional for post-op pain  Level of Consciousness: awake, alert , oriented and patient cooperative  Airway & Oxygen Therapy: Patient Spontanous Breathing and Patient connected to nasal cannula oxygen  Post-op Assessment: Report given to RN and Post -op Vital signs reviewed and stable  Post vital signs: Reviewed and stable  Last Vitals:  Vitals Value Taken Time  BP    Temp    Pulse 71 01/25/21 0902  Resp 6 01/25/21 0902  SpO2 100 % 01/25/21 0902  Vitals shown include unvalidated device data.  Last Pain:  Vitals:   01/25/21 0643  TempSrc:   PainSc: 0-No pain      Patients Stated Pain Goal: 4 (40/81/44 8185)  Complications: No complications documented.

## 2021-01-25 NOTE — Progress Notes (Signed)
Orthopedic Tech Progress Note Patient Details:  Peter [name] [birth year] [id number]   Left sling at bedside Ortho Devices Type of Ortho Device: Arm sling Ortho Device/Splint Location: lue Ortho Device/Splint Interventions: Ordered       Danton Sewer A [name] 01/25/2021, 9:13 AM

## 2021-01-25 NOTE — Interval H&P Note (Signed)
History and Physical Interval Note:  01/25/2021 7:17 AM  [name]  has presented today for surgery, with the diagnosis of ESRD.  The various methods of treatment have been discussed with the patient and family. After consideration of risks, benefits and other options for treatment, the patient has consented to  Procedure(s) with comments: LEFT FIRST STAGE [place] (Left) - PERIPHERAL NERVE BLOCK as a surgical intervention.  The patient's history has been reviewed, patient examined, no change in status, stable for surgery.  I have reviewed the patient's chart and labs.  Questions were answered to the patient's satisfaction.     [name]

## 2021-01-26 ENCOUNTER — Encounter (HOSPITAL_COMMUNITY): Payer: Self-pay | Admitting: Vascular Surgery

## 2021-01-26 DIAGNOSIS — Z992 Dependence on renal dialysis: Secondary | ICD-10-CM | POA: Diagnosis not present

## 2021-01-26 DIAGNOSIS — N186 End stage renal disease: Secondary | ICD-10-CM | POA: Diagnosis not present

## 2021-01-26 DIAGNOSIS — E876 Hypokalemia: Secondary | ICD-10-CM | POA: Diagnosis not present

## 2021-01-26 DIAGNOSIS — N2581 Secondary hyperparathyroidism of renal origin: Secondary | ICD-10-CM | POA: Diagnosis not present

## 2021-01-26 DIAGNOSIS — D689 Coagulation defect, unspecified: Secondary | ICD-10-CM | POA: Diagnosis not present

## 2021-01-26 DIAGNOSIS — T8249XA Other complication of vascular dialysis catheter, initial encounter: Secondary | ICD-10-CM | POA: Diagnosis not present

## 2021-01-27 DIAGNOSIS — Z992 Dependence on renal dialysis: Secondary | ICD-10-CM | POA: Diagnosis not present

## 2021-01-27 DIAGNOSIS — N186 End stage renal disease: Secondary | ICD-10-CM | POA: Diagnosis not present

## 2021-01-27 DIAGNOSIS — E1129 Type 2 diabetes mellitus with other diabetic kidney complication: Secondary | ICD-10-CM | POA: Diagnosis not present

## 2021-01-29 DIAGNOSIS — Z992 Dependence on renal dialysis: Secondary | ICD-10-CM | POA: Diagnosis not present

## 2021-01-29 DIAGNOSIS — N186 End stage renal disease: Secondary | ICD-10-CM | POA: Diagnosis not present

## 2021-01-29 DIAGNOSIS — D631 Anemia in chronic kidney disease: Secondary | ICD-10-CM | POA: Diagnosis not present

## 2021-01-29 DIAGNOSIS — N2581 Secondary hyperparathyroidism of renal origin: Secondary | ICD-10-CM | POA: Diagnosis not present

## 2021-01-29 DIAGNOSIS — T8249XA Other complication of vascular dialysis catheter, initial encounter: Secondary | ICD-10-CM | POA: Diagnosis not present

## 2021-01-29 DIAGNOSIS — E876 Hypokalemia: Secondary | ICD-10-CM | POA: Diagnosis not present

## 2021-01-29 DIAGNOSIS — D689 Coagulation defect, unspecified: Secondary | ICD-10-CM | POA: Diagnosis not present

## 2021-01-31 DIAGNOSIS — E876 Hypokalemia: Secondary | ICD-10-CM | POA: Diagnosis not present

## 2021-01-31 DIAGNOSIS — D689 Coagulation defect, unspecified: Secondary | ICD-10-CM | POA: Diagnosis not present

## 2021-01-31 DIAGNOSIS — D631 Anemia in chronic kidney disease: Secondary | ICD-10-CM | POA: Diagnosis not present

## 2021-01-31 DIAGNOSIS — Z992 Dependence on renal dialysis: Secondary | ICD-10-CM | POA: Diagnosis not present

## 2021-01-31 DIAGNOSIS — N186 End stage renal disease: Secondary | ICD-10-CM | POA: Diagnosis not present

## 2021-01-31 DIAGNOSIS — N2581 Secondary hyperparathyroidism of renal origin: Secondary | ICD-10-CM | POA: Diagnosis not present

## 2021-01-31 DIAGNOSIS — T8249XA Other complication of vascular dialysis catheter, initial encounter: Secondary | ICD-10-CM | POA: Diagnosis not present

## 2021-02-02 DIAGNOSIS — D689 Coagulation defect, unspecified: Secondary | ICD-10-CM | POA: Diagnosis not present

## 2021-02-02 DIAGNOSIS — T8249XA Other complication of vascular dialysis catheter, initial encounter: Secondary | ICD-10-CM | POA: Diagnosis not present

## 2021-02-02 DIAGNOSIS — N2581 Secondary hyperparathyroidism of renal origin: Secondary | ICD-10-CM | POA: Diagnosis not present

## 2021-02-02 DIAGNOSIS — E876 Hypokalemia: Secondary | ICD-10-CM | POA: Diagnosis not present

## 2021-02-02 DIAGNOSIS — D631 Anemia in chronic kidney disease: Secondary | ICD-10-CM | POA: Diagnosis not present

## 2021-02-02 DIAGNOSIS — Z992 Dependence on renal dialysis: Secondary | ICD-10-CM | POA: Diagnosis not present

## 2021-02-02 DIAGNOSIS — N186 End stage renal disease: Secondary | ICD-10-CM | POA: Diagnosis not present

## 2021-02-05 DIAGNOSIS — N2581 Secondary hyperparathyroidism of renal origin: Secondary | ICD-10-CM | POA: Diagnosis not present

## 2021-02-05 DIAGNOSIS — D689 Coagulation defect, unspecified: Secondary | ICD-10-CM | POA: Diagnosis not present

## 2021-02-05 DIAGNOSIS — T8249XA Other complication of vascular dialysis catheter, initial encounter: Secondary | ICD-10-CM | POA: Diagnosis not present

## 2021-02-05 DIAGNOSIS — N186 End stage renal disease: Secondary | ICD-10-CM | POA: Diagnosis not present

## 2021-02-05 DIAGNOSIS — Z992 Dependence on renal dialysis: Secondary | ICD-10-CM | POA: Diagnosis not present

## 2021-02-05 DIAGNOSIS — E876 Hypokalemia: Secondary | ICD-10-CM | POA: Diagnosis not present

## 2021-02-06 ENCOUNTER — Other Ambulatory Visit: Payer: Self-pay

## 2021-02-06 DIAGNOSIS — Z992 Dependence on renal dialysis: Secondary | ICD-10-CM

## 2021-02-06 DIAGNOSIS — N186 End stage renal disease: Secondary | ICD-10-CM

## 2021-02-07 DIAGNOSIS — D689 Coagulation defect, unspecified: Secondary | ICD-10-CM | POA: Diagnosis not present

## 2021-02-07 DIAGNOSIS — T8249XA Other complication of vascular dialysis catheter, initial encounter: Secondary | ICD-10-CM | POA: Diagnosis not present

## 2021-02-07 DIAGNOSIS — Z992 Dependence on renal dialysis: Secondary | ICD-10-CM | POA: Diagnosis not present

## 2021-02-07 DIAGNOSIS — N186 End stage renal disease: Secondary | ICD-10-CM | POA: Diagnosis not present

## 2021-02-07 DIAGNOSIS — E876 Hypokalemia: Secondary | ICD-10-CM | POA: Diagnosis not present

## 2021-02-07 DIAGNOSIS — N2581 Secondary hyperparathyroidism of renal origin: Secondary | ICD-10-CM | POA: Diagnosis not present

## 2021-02-09 DIAGNOSIS — Z992 Dependence on renal dialysis: Secondary | ICD-10-CM | POA: Diagnosis not present

## 2021-02-09 DIAGNOSIS — T8249XA Other complication of vascular dialysis catheter, initial encounter: Secondary | ICD-10-CM | POA: Diagnosis not present

## 2021-02-09 DIAGNOSIS — N2581 Secondary hyperparathyroidism of renal origin: Secondary | ICD-10-CM | POA: Diagnosis not present

## 2021-02-09 DIAGNOSIS — N186 End stage renal disease: Secondary | ICD-10-CM | POA: Diagnosis not present

## 2021-02-09 DIAGNOSIS — E876 Hypokalemia: Secondary | ICD-10-CM | POA: Diagnosis not present

## 2021-02-09 DIAGNOSIS — D689 Coagulation defect, unspecified: Secondary | ICD-10-CM | POA: Diagnosis not present

## 2021-02-12 DIAGNOSIS — T8249XA Other complication of vascular dialysis catheter, initial encounter: Secondary | ICD-10-CM | POA: Diagnosis not present

## 2021-02-12 DIAGNOSIS — N186 End stage renal disease: Secondary | ICD-10-CM | POA: Diagnosis not present

## 2021-02-12 DIAGNOSIS — D689 Coagulation defect, unspecified: Secondary | ICD-10-CM | POA: Diagnosis not present

## 2021-02-12 DIAGNOSIS — N2581 Secondary hyperparathyroidism of renal origin: Secondary | ICD-10-CM | POA: Diagnosis not present

## 2021-02-12 DIAGNOSIS — Z992 Dependence on renal dialysis: Secondary | ICD-10-CM | POA: Diagnosis not present

## 2021-02-14 DIAGNOSIS — N2581 Secondary hyperparathyroidism of renal origin: Secondary | ICD-10-CM | POA: Diagnosis not present

## 2021-02-14 DIAGNOSIS — N186 End stage renal disease: Secondary | ICD-10-CM | POA: Diagnosis not present

## 2021-02-14 DIAGNOSIS — D689 Coagulation defect, unspecified: Secondary | ICD-10-CM | POA: Diagnosis not present

## 2021-02-14 DIAGNOSIS — Z992 Dependence on renal dialysis: Secondary | ICD-10-CM | POA: Diagnosis not present

## 2021-02-14 DIAGNOSIS — T8249XA Other complication of vascular dialysis catheter, initial encounter: Secondary | ICD-10-CM | POA: Diagnosis not present

## 2021-02-16 DIAGNOSIS — D689 Coagulation defect, unspecified: Secondary | ICD-10-CM | POA: Diagnosis not present

## 2021-02-16 DIAGNOSIS — T8249XA Other complication of vascular dialysis catheter, initial encounter: Secondary | ICD-10-CM | POA: Diagnosis not present

## 2021-02-16 DIAGNOSIS — N2581 Secondary hyperparathyroidism of renal origin: Secondary | ICD-10-CM | POA: Diagnosis not present

## 2021-02-16 DIAGNOSIS — Z992 Dependence on renal dialysis: Secondary | ICD-10-CM | POA: Diagnosis not present

## 2021-02-16 DIAGNOSIS — N186 End stage renal disease: Secondary | ICD-10-CM | POA: Diagnosis not present

## 2021-02-19 DIAGNOSIS — D689 Coagulation defect, unspecified: Secondary | ICD-10-CM | POA: Diagnosis not present

## 2021-02-19 DIAGNOSIS — E876 Hypokalemia: Secondary | ICD-10-CM | POA: Diagnosis not present

## 2021-02-19 DIAGNOSIS — N186 End stage renal disease: Secondary | ICD-10-CM | POA: Diagnosis not present

## 2021-02-19 DIAGNOSIS — N2581 Secondary hyperparathyroidism of renal origin: Secondary | ICD-10-CM | POA: Diagnosis not present

## 2021-02-19 DIAGNOSIS — T8249XA Other complication of vascular dialysis catheter, initial encounter: Secondary | ICD-10-CM | POA: Diagnosis not present

## 2021-02-19 DIAGNOSIS — Z992 Dependence on renal dialysis: Secondary | ICD-10-CM | POA: Diagnosis not present

## 2021-02-21 DIAGNOSIS — N2581 Secondary hyperparathyroidism of renal origin: Secondary | ICD-10-CM | POA: Diagnosis not present

## 2021-02-21 DIAGNOSIS — Z992 Dependence on renal dialysis: Secondary | ICD-10-CM | POA: Diagnosis not present

## 2021-02-21 DIAGNOSIS — D689 Coagulation defect, unspecified: Secondary | ICD-10-CM | POA: Diagnosis not present

## 2021-02-21 DIAGNOSIS — N186 End stage renal disease: Secondary | ICD-10-CM | POA: Diagnosis not present

## 2021-02-21 DIAGNOSIS — T8249XA Other complication of vascular dialysis catheter, initial encounter: Secondary | ICD-10-CM | POA: Diagnosis not present

## 2021-02-21 DIAGNOSIS — E876 Hypokalemia: Secondary | ICD-10-CM | POA: Diagnosis not present

## 2021-02-23 DIAGNOSIS — T8249XA Other complication of vascular dialysis catheter, initial encounter: Secondary | ICD-10-CM | POA: Diagnosis not present

## 2021-02-23 DIAGNOSIS — Z992 Dependence on renal dialysis: Secondary | ICD-10-CM | POA: Diagnosis not present

## 2021-02-23 DIAGNOSIS — N2581 Secondary hyperparathyroidism of renal origin: Secondary | ICD-10-CM | POA: Diagnosis not present

## 2021-02-23 DIAGNOSIS — D689 Coagulation defect, unspecified: Secondary | ICD-10-CM | POA: Diagnosis not present

## 2021-02-23 DIAGNOSIS — E876 Hypokalemia: Secondary | ICD-10-CM | POA: Diagnosis not present

## 2021-02-23 DIAGNOSIS — N186 End stage renal disease: Secondary | ICD-10-CM | POA: Diagnosis not present

## 2021-02-26 DIAGNOSIS — D689 Coagulation defect, unspecified: Secondary | ICD-10-CM | POA: Diagnosis not present

## 2021-02-26 DIAGNOSIS — N186 End stage renal disease: Secondary | ICD-10-CM | POA: Diagnosis not present

## 2021-02-26 DIAGNOSIS — N2581 Secondary hyperparathyroidism of renal origin: Secondary | ICD-10-CM | POA: Diagnosis not present

## 2021-02-26 DIAGNOSIS — T8249XA Other complication of vascular dialysis catheter, initial encounter: Secondary | ICD-10-CM | POA: Diagnosis not present

## 2021-02-26 DIAGNOSIS — Z992 Dependence on renal dialysis: Secondary | ICD-10-CM | POA: Diagnosis not present

## 2021-02-27 DIAGNOSIS — N186 End stage renal disease: Secondary | ICD-10-CM | POA: Diagnosis not present

## 2021-02-27 DIAGNOSIS — E1129 Type 2 diabetes mellitus with other diabetic kidney complication: Secondary | ICD-10-CM | POA: Diagnosis not present

## 2021-02-27 DIAGNOSIS — Z992 Dependence on renal dialysis: Secondary | ICD-10-CM | POA: Diagnosis not present

## 2021-02-28 DIAGNOSIS — Z992 Dependence on renal dialysis: Secondary | ICD-10-CM | POA: Diagnosis not present

## 2021-02-28 DIAGNOSIS — T8249XA Other complication of vascular dialysis catheter, initial encounter: Secondary | ICD-10-CM | POA: Diagnosis not present

## 2021-02-28 DIAGNOSIS — D689 Coagulation defect, unspecified: Secondary | ICD-10-CM | POA: Diagnosis not present

## 2021-02-28 DIAGNOSIS — N2581 Secondary hyperparathyroidism of renal origin: Secondary | ICD-10-CM | POA: Diagnosis not present

## 2021-02-28 DIAGNOSIS — N186 End stage renal disease: Secondary | ICD-10-CM | POA: Diagnosis not present

## 2021-03-02 DIAGNOSIS — D689 Coagulation defect, unspecified: Secondary | ICD-10-CM | POA: Diagnosis not present

## 2021-03-02 DIAGNOSIS — N2581 Secondary hyperparathyroidism of renal origin: Secondary | ICD-10-CM | POA: Diagnosis not present

## 2021-03-02 DIAGNOSIS — T8249XA Other complication of vascular dialysis catheter, initial encounter: Secondary | ICD-10-CM | POA: Diagnosis not present

## 2021-03-02 DIAGNOSIS — Z992 Dependence on renal dialysis: Secondary | ICD-10-CM | POA: Diagnosis not present

## 2021-03-02 DIAGNOSIS — N186 End stage renal disease: Secondary | ICD-10-CM | POA: Diagnosis not present

## 2021-03-05 DIAGNOSIS — T8249XA Other complication of vascular dialysis catheter, initial encounter: Secondary | ICD-10-CM | POA: Diagnosis not present

## 2021-03-05 DIAGNOSIS — N186 End stage renal disease: Secondary | ICD-10-CM | POA: Diagnosis not present

## 2021-03-05 DIAGNOSIS — Z992 Dependence on renal dialysis: Secondary | ICD-10-CM | POA: Diagnosis not present

## 2021-03-05 DIAGNOSIS — D689 Coagulation defect, unspecified: Secondary | ICD-10-CM | POA: Diagnosis not present

## 2021-03-05 DIAGNOSIS — E876 Hypokalemia: Secondary | ICD-10-CM | POA: Diagnosis not present

## 2021-03-05 DIAGNOSIS — N2581 Secondary hyperparathyroidism of renal origin: Secondary | ICD-10-CM | POA: Diagnosis not present

## 2021-03-06 ENCOUNTER — Other Ambulatory Visit: Payer: Self-pay

## 2021-03-06 ENCOUNTER — Ambulatory Visit (HOSPITAL_COMMUNITY)
Admission: RE | Admit: 2021-03-06 | Discharge: 2021-03-06 | Disposition: A | Discharge status: Home / Self Care | Payer: Medicare Other | Source: Ambulatory Visit | Attending: Vascular Surgery | Admitting: Vascular Surgery

## 2021-03-06 ENCOUNTER — Ambulatory Visit (INDEPENDENT_AMBULATORY_CARE_PROVIDER_SITE_OTHER): Payer: Medicare Other | Admitting: Physician Assistant

## 2021-03-06 VITALS — BP 173/80 | HR 67 | Temp 97.9°F | Resp 20 | Ht 68.0 in | Wt 231.5 lb

## 2021-03-06 DIAGNOSIS — Z992 Dependence on renal dialysis: Secondary | ICD-10-CM | POA: Diagnosis not present

## 2021-03-06 DIAGNOSIS — N186 End stage renal disease: Secondary | ICD-10-CM

## 2021-03-06 NOTE — H&P (View-Only) (Signed)
    Postoperative Access Visit   History of Present Illness   Traye Bates is a 70 y.o. year old male who presents for postoperative follow-up for: Left first stage brachiobasilic arteriovenous fistula creation on 01/25/21 by Dr. Stanford Breed. The patient's wounds are healed.  The patient notes no steal symptoms.  The patient is able to complete their activities of daily living.   He has history of Left upper extremity cephalic vein fistula that never matured and a thrombosed right brachiocephalic AV fistula  He dialyzes on MWF via right IJ TDC at the [address]  Physical Examination   Vitals:   03/06/21 1218  BP: (!) 173/80  Pulse: 67  Resp: 20  Temp: 97.9 F (36.6 C)  TempSrc: Temporal  SpO2: 96%  Weight: 231 lb 8 oz (105 kg)  Height: 5\' 8"  (1.727 m)   Body mass index is 35.2 kg/m.  left arm Incision is well healed, 2+ radial pulse, hand grip is 5/5, sensation in digits is  intact, palpable thrill, bruit can  be auscultated. Some pulsatility to the fistula. The fistula is very deep in the left upper arm. There is also an area of fullness at the Antecubital fossa which is likely a small seroma   Non invasive vascular lab: 03/06/21 I have independently interpreted his dialysis duplex which demonstrates flow volume of 368 mL/min with a diameter of the fistula range between 0.13 cm and 0.77 cm.  The depth in the upper arm gets as deep as 2.20 cm at the shoulder.  There is a competing branch identified in the mid upper arm and narrowing at the anastomosis with velocities >902. of note there is a 1.8 x 2.7 cm fluid collection in the medial distal upper arm.   Medical Decision Making   Javontae Marlette is a 71 y.o. year old male who presents s/p Left first stage brachiobasilic arteriovenous fistula creation on 01/25/21 by Dr. Stanford Breed. He is without steal symptoms. Duplex today shows that the fistula has matured nicely however it is too deep in the left upper arm. There are also  competing branches and stenosis at the anastomosis with elevated velocities. Will schedule him for a 2nd stage transposition with Dr. Stanford Breed or Dr. Donzetta Matters. He dialyzes on MWF so will try to schedule him for a non dialysis day in the near future.   Karoline Caldwell, PA-C [facility] [place] Office: [phone]  Clinic MD: Carlis Abbott / Stanford Breed

## 2021-03-06 NOTE — Progress Notes (Signed)
    Postoperative Access Visit   History of Present Illness   Peter [name] is a 70 y.o. year old male who presents for postoperative follow-up for: Left first stage brachiobasilic arteriovenous fistula creation on 01/25/21 by Dr. Stanford Breed. The patient's wounds are healed.  The patient notes no steal symptoms.  The patient is able to complete their activities of daily living.   He has history of Left upper extremity cephalic vein fistula that never matured and a thrombosed right brachiocephalic AV fistula  He dialyzes on MWF via right IJ TDC [address]  Physical Examination   Vitals:   03/06/21 1218  BP: (!) 173/80  Pulse: 67  Resp: 20  Temp: 97.9 F (36.6 C)  TempSrc: Temporal  SpO2: 96%  Weight: 231 lb 8 oz (105 kg)  Height: 5\' 8"  (1.727 m)   Body mass index is 35.2 kg/m.  left arm Incision is well healed, 2+ radial pulse, hand grip is 5/5, sensation in digits is  intact, palpable thrill, bruit can  be auscultated. Some pulsatility to the fistula. The fistula is very deep in the left upper arm. There is also an area of fullness at the Antecubital fossa which is likely a small seroma   Non invasive vascular lab: 03/06/21 I have independently interpreted his dialysis duplex which demonstrates flow volume of 368 mL/min with a diameter of the fistula range between 0.13 cm and 0.77 cm.  The depth in the upper arm gets as deep as 2.20 cm at the shoulder.  There is a competing branch identified in the mid upper arm and narrowing at the anastomosis with velocities >902. of note there is a 1.8 x 2.7 cm fluid collection in the medial distal upper arm.   Medical Decision Making   Peter [name] is a 70 y.o. year old male who presents s/p Left first stage brachiobasilic arteriovenous fistula creation on 01/25/21 by Dr. Stanford Breed. He is without steal symptoms. Duplex today shows that the fistula has matured nicely however it is too deep in the left upper arm. There are also  competing branches and stenosis at the anastomosis with elevated velocities. Will schedule him for a 2nd stage transposition with Dr. Stanford Breed or Dr. Donzetta Matters. He dialyzes on MWF so will try to schedule him for a non dialysis day in the near future.   Karoline Caldwell, PA-C [facility] [place] Office: [phone]  Clinic MD: Carlis Abbott / Stanford Breed

## 2021-03-07 DIAGNOSIS — E876 Hypokalemia: Secondary | ICD-10-CM | POA: Diagnosis not present

## 2021-03-07 DIAGNOSIS — T8249XA Other complication of vascular dialysis catheter, initial encounter: Secondary | ICD-10-CM | POA: Diagnosis not present

## 2021-03-07 DIAGNOSIS — D689 Coagulation defect, unspecified: Secondary | ICD-10-CM | POA: Diagnosis not present

## 2021-03-07 DIAGNOSIS — Z992 Dependence on renal dialysis: Secondary | ICD-10-CM | POA: Diagnosis not present

## 2021-03-07 DIAGNOSIS — N186 End stage renal disease: Secondary | ICD-10-CM | POA: Diagnosis not present

## 2021-03-07 DIAGNOSIS — N2581 Secondary hyperparathyroidism of renal origin: Secondary | ICD-10-CM | POA: Diagnosis not present

## 2021-03-09 DIAGNOSIS — N2581 Secondary hyperparathyroidism of renal origin: Secondary | ICD-10-CM | POA: Diagnosis not present

## 2021-03-09 DIAGNOSIS — N186 End stage renal disease: Secondary | ICD-10-CM | POA: Diagnosis not present

## 2021-03-09 DIAGNOSIS — T8249XA Other complication of vascular dialysis catheter, initial encounter: Secondary | ICD-10-CM | POA: Diagnosis not present

## 2021-03-09 DIAGNOSIS — E876 Hypokalemia: Secondary | ICD-10-CM | POA: Diagnosis not present

## 2021-03-09 DIAGNOSIS — Z992 Dependence on renal dialysis: Secondary | ICD-10-CM | POA: Diagnosis not present

## 2021-03-09 DIAGNOSIS — D689 Coagulation defect, unspecified: Secondary | ICD-10-CM | POA: Diagnosis not present

## 2021-03-12 DIAGNOSIS — N186 End stage renal disease: Secondary | ICD-10-CM | POA: Diagnosis not present

## 2021-03-12 DIAGNOSIS — Z992 Dependence on renal dialysis: Secondary | ICD-10-CM | POA: Diagnosis not present

## 2021-03-12 DIAGNOSIS — N2581 Secondary hyperparathyroidism of renal origin: Secondary | ICD-10-CM | POA: Diagnosis not present

## 2021-03-12 DIAGNOSIS — D689 Coagulation defect, unspecified: Secondary | ICD-10-CM | POA: Diagnosis not present

## 2021-03-12 DIAGNOSIS — T8249XA Other complication of vascular dialysis catheter, initial encounter: Secondary | ICD-10-CM | POA: Diagnosis not present

## 2021-03-14 ENCOUNTER — Encounter (HOSPITAL_COMMUNITY): Payer: Self-pay | Admitting: Vascular Surgery

## 2021-03-14 ENCOUNTER — Other Ambulatory Visit: Payer: Self-pay

## 2021-03-14 ENCOUNTER — Telehealth: Payer: Self-pay

## 2021-03-14 DIAGNOSIS — Z992 Dependence on renal dialysis: Secondary | ICD-10-CM | POA: Diagnosis not present

## 2021-03-14 DIAGNOSIS — N186 End stage renal disease: Secondary | ICD-10-CM | POA: Diagnosis not present

## 2021-03-14 DIAGNOSIS — N2581 Secondary hyperparathyroidism of renal origin: Secondary | ICD-10-CM | POA: Diagnosis not present

## 2021-03-14 DIAGNOSIS — D689 Coagulation defect, unspecified: Secondary | ICD-10-CM | POA: Diagnosis not present

## 2021-03-14 DIAGNOSIS — T8249XA Other complication of vascular dialysis catheter, initial encounter: Secondary | ICD-10-CM | POA: Diagnosis not present

## 2021-03-14 MED ORDER — VANCOMYCIN HCL 1500 MG/300ML IV SOLN
1500.0000 mg | INTRAVENOUS | Status: AC
  Administered 2021-03-15: 1500 mg via INTRAVENOUS
  Filled 2021-03-14 (×2): qty 300

## 2021-03-14 NOTE — Anesthesia Preprocedure Evaluation (Addendum)
Anesthesia Evaluation  Patient identified by MRN, date of birth, ID band Patient awake    Reviewed: Allergy & Precautions, NPO status , Patient's Chart, lab work & pertinent test results  History of Anesthesia Complications Negative for: history of anesthetic complications  Airway Mallampati: III  TM Distance: >3 FB Neck ROM: Full    Dental  (+) Dental Advisory Given   Pulmonary sleep apnea , former smoker,    Pulmonary exam normal        Cardiovascular hypertension, Pt. on medications Normal cardiovascular exam+ Valvular Problems/Murmurs MVP    '19 TTE - LV cavity size was mildly dilated. There was moderate concentric hypertrophy. EF 60% to 65%. Grade 2 diastolic dysfunction. 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 There was moderate MR directed eccentrically and anteriorly.  LA was moderately to severely dilated. RA was mildly dilated. Trivial TR.    Neuro/Psych negative neurological ROS  negative psych ROS   GI/Hepatic negative GI ROS, Neg liver ROS,   Endo/Other  diabetes, Well Controlled, Type 2 Obesity   Renal/GU ESRF and DialysisRenal disease     Musculoskeletal negative musculoskeletal ROS (+)   Abdominal   Peds  Hematology  (+) anemia ,   Anesthesia Other Findings   Reproductive/Obstetrics                            Anesthesia Physical Anesthesia Plan  ASA: 3  Anesthesia Plan: Regional   Post-op Pain Management:    Induction: Intravenous  PONV Risk Score and Plan: 1 and Propofol infusion and Treatment may vary due to age or medical condition  Airway Management Planned: Natural Airway and Simple Face Mask  Additional Equipment: None  Intra-op Plan:   Post-operative Plan:   Informed Consent: I have reviewed the patients History and Physical,  chart, labs and discussed the procedure including the risks, benefits and alternatives for the proposed anesthesia with the patient or authorized representative who has indicated his/her understanding and acceptance.       Plan Discussed with: CRNA and Anesthesiologist  Anesthesia Plan Comments:        Anesthesia Quick Evaluation

## 2021-03-14 NOTE — Progress Notes (Signed)
PCP - Dr. Oneida Arenas  Cardiologist - Denies  Chest x-ray - Denies  EKG - 01/25/21 (E)  Stress Test - Denies  ECHO - 05/30/18 (E)  Cardiac Cath - Denies  AICD-na PM-na LOOP-na  Sleep Study - Yes- Positive CPAP - Denies  LABS- I-STAT-8  ASA- Denies  ERAS- No  HA1C- 05/30/18: 5.7 (E) Fasting Blood Sugar - Denies Checks Blood Sugar ___0__ times a day, Per Audree Camel, the pt has never had to check his blood sugars at home.  Anesthesia- No  Per Audree Camel, the pt denies having chest pain, sob, or fever during the pre-op phone call. All instructions explained to the pt and Judson Roch, with a verbal understanding of the material including: as of today, stop taking all Aspirin (unless instructed by your doctor) and Other Aspirin containing products, Vitamins, Fish oils, and Herbal medications. Also stop all NSAIDS i.e. Advil, Ibuprofen, Motrin, Aleve, Anaprox, Naproxen, BC, Goody Powders, and all Supplements.  They were also instructed to wear a mask and practice social distancing if he has to go out prior to his surgery. The opportunity to ask questions was provided.    Coronavirus Screening  Have you experienced the following symptoms:  Cough yes/no: No Fever (>100.32F)  yes/no: No Runny nose yes/no: No Sore throat yes/no: No Difficulty breathing/shortness of breath  yes/no: No  Have you or a family member traveled in the last 14 days and where? yes/no: No   If the patient indicates "YES" to the above questions, their PAT will be rescheduled to limit the exposure to others and, the surgeon will be notified. THE PATIENT WILL NEED TO BE ASYMPTOMATIC FOR 14 DAYS.   If the patient is not experiencing any of these symptoms, the PAT nurse will instruct them to NOT bring anyone with them to their appointment since they may have these symptoms or traveled as well.   Please remind your patients and families that hospital visitation restrictions are in effect and the importance of  the restrictions.

## 2021-03-14 NOTE — Telephone Encounter (Signed)
Left pt general message to update in appointment arrival time of 0530 AM on tomorrow at [facility].

## 2021-03-15 ENCOUNTER — Encounter (HOSPITAL_COMMUNITY): Payer: Self-pay | Admitting: Vascular Surgery

## 2021-03-15 ENCOUNTER — Encounter (HOSPITAL_COMMUNITY)
Admission: RE | Disposition: A | Discharge status: Home / Self Care | Payer: Self-pay | Source: Home / Self Care | Attending: Vascular Surgery

## 2021-03-15 ENCOUNTER — Ambulatory Visit (HOSPITAL_COMMUNITY): Payer: Medicare Other | Admitting: Certified Registered Nurse Anesthetist

## 2021-03-15 ENCOUNTER — Ambulatory Visit (HOSPITAL_COMMUNITY)
Admission: RE | Admit: 2021-03-15 | Discharge: 2021-03-15 | Disposition: A | Discharge status: Home / Self Care | Payer: Medicare Other | Attending: Vascular Surgery | Admitting: Vascular Surgery

## 2021-03-15 ENCOUNTER — Other Ambulatory Visit: Payer: Self-pay

## 2021-03-15 DIAGNOSIS — N186 End stage renal disease: Secondary | ICD-10-CM | POA: Insufficient documentation

## 2021-03-15 DIAGNOSIS — I120 Hypertensive chronic kidney disease with stage 5 chronic kidney disease or end stage renal disease: Secondary | ICD-10-CM | POA: Diagnosis not present

## 2021-03-15 DIAGNOSIS — Z992 Dependence on renal dialysis: Secondary | ICD-10-CM | POA: Diagnosis not present

## 2021-03-15 DIAGNOSIS — Z9989 Dependence on other enabling machines and devices: Secondary | ICD-10-CM | POA: Diagnosis not present

## 2021-03-15 DIAGNOSIS — G4733 Obstructive sleep apnea (adult) (pediatric): Secondary | ICD-10-CM | POA: Diagnosis not present

## 2021-03-15 DIAGNOSIS — Z87891 Personal history of nicotine dependence: Secondary | ICD-10-CM | POA: Diagnosis not present

## 2021-03-15 HISTORY — PX: APPLICATION OF WOUND VAC: SHX5189

## 2021-03-15 HISTORY — PX: BASCILIC VEIN TRANSPOSITION: SHX5742

## 2021-03-15 LAB — POCT I-STAT, CHEM 8
BUN: 17 mg/dL (ref 8–23)
Calcium, Ion: 1.18 mmol/L (ref 1.15–1.40)
Chloride: 97 mmol/L — ABNORMAL LOW (ref 98–111)
Creatinine, Ser: 5.5 mg/dL — ABNORMAL HIGH (ref 0.61–1.24)
Glucose, Bld: 102 mg/dL — ABNORMAL HIGH (ref 70–99)
HCT: 33 % — ABNORMAL LOW (ref 39.0–52.0)
Hemoglobin: 11.2 g/dL — ABNORMAL LOW (ref 13.0–17.0)
Potassium: 3.8 mmol/L (ref 3.5–5.1)
Sodium: 136 mmol/L (ref 135–145)
TCO2: 30 mmol/L (ref 22–32)

## 2021-03-15 LAB — GLUCOSE, CAPILLARY
Glucose-Capillary: 101 mg/dL — ABNORMAL HIGH (ref 70–99)
Glucose-Capillary: 103 mg/dL — ABNORMAL HIGH (ref 70–99)
Glucose-Capillary: 88 mg/dL (ref 70–99)

## 2021-03-15 SURGERY — TRANSPOSITION, VEIN, BASILIC
Anesthesia: Regional | Site: Arm Upper | Laterality: Right

## 2021-03-15 MED ORDER — ONDANSETRON HCL 4 MG/2ML IJ SOLN
INTRAMUSCULAR | Status: DC | PRN
  Administered 2021-03-15: 4 mg via INTRAVENOUS

## 2021-03-15 MED ORDER — SODIUM CHLORIDE 0.9 % IV SOLN: INTRAVENOUS | Status: DC | PRN

## 2021-03-15 MED ORDER — SODIUM CHLORIDE 0.9 % IV SOLN
INTRAVENOUS | Status: DC | PRN
  Administered 2021-03-15: 500 mL

## 2021-03-15 MED ORDER — OXYCODONE-ACETAMINOPHEN 5-325 MG PO TABS: 1.0000 | ORAL_TABLET | Freq: Four times a day (QID) | ORAL | 0 refills | Status: AC | PRN

## 2021-03-15 MED ORDER — PROPOFOL 500 MG/50ML IV EMUL
INTRAVENOUS | Status: DC | PRN
  Administered 2021-03-15: 100 ug/kg/min via INTRAVENOUS

## 2021-03-15 MED ORDER — CHLORHEXIDINE GLUCONATE 4 % EX LIQD: 60.0000 mL | Freq: Once | CUTANEOUS | Status: DC

## 2021-03-15 MED ORDER — LIDOCAINE-EPINEPHRINE (PF) 1.5 %-1:200000 IJ SOLN
INTRAMUSCULAR | Status: DC | PRN
  Administered 2021-03-15: 30 mL via PERINEURAL

## 2021-03-15 MED ORDER — PROPOFOL 10 MG/ML IV BOLUS
INTRAVENOUS | Status: DC | PRN
  Administered 2021-03-15: 10 mg via INTRAVENOUS
  Administered 2021-03-15: 70 mg via INTRAVENOUS

## 2021-03-15 MED ORDER — LIDOCAINE HCL (PF) 1 % IJ SOLN
INTRAMUSCULAR | Status: AC
  Filled 2021-03-15: qty 30

## 2021-03-15 MED ORDER — MIDAZOLAM HCL 2 MG/2ML IJ SOLN
INTRAMUSCULAR | Status: AC
  Filled 2021-03-15: qty 2

## 2021-03-15 MED ORDER — ONDANSETRON HCL 4 MG/2ML IJ SOLN: 4.0000 mg | Freq: Once | INTRAMUSCULAR | Status: DC | PRN

## 2021-03-15 MED ORDER — HEMOSTATIC AGENTS (NO CHARGE) OPTIME
TOPICAL | Status: DC | PRN
  Administered 2021-03-15: 1 via TOPICAL

## 2021-03-15 MED ORDER — PROPOFOL 10 MG/ML IV BOLUS
INTRAVENOUS | Status: AC
  Filled 2021-03-15: qty 20

## 2021-03-15 MED ORDER — SODIUM CHLORIDE 0.9 % IV SOLN
INTRAVENOUS | Status: AC
  Filled 2021-03-15: qty 1.2

## 2021-03-15 MED ORDER — ONDANSETRON HCL 4 MG/2ML IJ SOLN
INTRAMUSCULAR | Status: AC
  Filled 2021-03-15: qty 2

## 2021-03-15 MED ORDER — FENTANYL CITRATE (PF) 100 MCG/2ML IJ SOLN: 25.0000 ug | INTRAMUSCULAR | Status: DC | PRN

## 2021-03-15 MED ORDER — SODIUM CHLORIDE 0.9 % IV SOLN: INTRAVENOUS | Status: DC

## 2021-03-15 MED ORDER — CHLORHEXIDINE GLUCONATE 0.12 % MT SOLN
OROMUCOSAL | Status: AC
  Administered 2021-03-15: 15 mL via OROMUCOSAL
  Filled 2021-03-15: qty 15

## 2021-03-15 MED ORDER — CHLORHEXIDINE GLUCONATE 0.12 % MT SOLN: 15.0000 mL | Freq: Once | OROMUCOSAL | Status: AC

## 2021-03-15 MED ORDER — FENTANYL CITRATE (PF) 250 MCG/5ML IJ SOLN
INTRAMUSCULAR | Status: AC
  Filled 2021-03-15: qty 5

## 2021-03-15 MED ORDER — MIDAZOLAM HCL 5 MG/5ML IJ SOLN
INTRAMUSCULAR | Status: DC | PRN
  Administered 2021-03-15: 1 mg via INTRAVENOUS

## 2021-03-15 MED ORDER — 0.9 % SODIUM CHLORIDE (POUR BTL) OPTIME
TOPICAL | Status: DC | PRN
  Administered 2021-03-15: 1000 mL

## 2021-03-15 MED ORDER — LIDOCAINE HCL (CARDIAC) PF 100 MG/5ML IV SOSY
PREFILLED_SYRINGE | INTRAVENOUS | Status: DC | PRN
  Administered 2021-03-15: 40 mg via INTRAVENOUS

## 2021-03-15 MED ORDER — DEXAMETHASONE SODIUM PHOSPHATE 10 MG/ML IJ SOLN
INTRAMUSCULAR | Status: AC
  Filled 2021-03-15: qty 1

## 2021-03-15 MED ORDER — DEXAMETHASONE SODIUM PHOSPHATE 10 MG/ML IJ SOLN
INTRAMUSCULAR | Status: DC | PRN
  Administered 2021-03-15: 4 mg via INTRAVENOUS

## 2021-03-15 MED ORDER — OXYCODONE HCL 5 MG PO TABS: 5.0000 mg | ORAL_TABLET | Freq: Once | ORAL | Status: DC | PRN

## 2021-03-15 MED ORDER — ORAL CARE MOUTH RINSE: 15.0000 mL | Freq: Once | OROMUCOSAL | Status: AC

## 2021-03-15 MED ORDER — FENTANYL CITRATE (PF) 100 MCG/2ML IJ SOLN
INTRAMUSCULAR | Status: DC | PRN
  Administered 2021-03-15 (×4): 25 ug via INTRAVENOUS

## 2021-03-15 MED ORDER — PHENYLEPHRINE HCL-NACL 10-0.9 MG/250ML-% IV SOLN
INTRAVENOUS | Status: DC | PRN
  Administered 2021-03-15: 25 ug/min via INTRAVENOUS

## 2021-03-15 MED ORDER — OXYCODONE HCL 5 MG/5ML PO SOLN: 5.0000 mg | Freq: Once | ORAL | Status: DC | PRN

## 2021-03-15 SURGICAL SUPPLY — 47 items
ADH SKN CLS APL DERMABOND .7 (GAUZE/BANDAGES/DRESSINGS) ×2
ARMBAND PINK RESTRICT EXTREMIT (MISCELLANEOUS) ×4 IMPLANT
BNDG CMPR MED 15X6 ELC VLCR LF (GAUZE/BANDAGES/DRESSINGS) ×2
BNDG ELASTIC 4X5.8 VLCR STR LF (GAUZE/BANDAGES/DRESSINGS) ×4 IMPLANT
BNDG ELASTIC 6X15 VLCR STRL LF (GAUZE/BANDAGES/DRESSINGS) ×4 IMPLANT
CANISTER SUCT 3000ML PPV (MISCELLANEOUS) ×4 IMPLANT
CANNULA VESSEL 3MM 2 BLNT TIP (CANNULA) ×4 IMPLANT
CLIP LIGATING EXTRA MED SLVR (CLIP) IMPLANT
CLIP LIGATING EXTRA SM BLUE (MISCELLANEOUS) IMPLANT
CLIP VESOCCLUDE MED 6/CT (CLIP) ×12 IMPLANT
CLIP VESOCCLUDE SM WIDE 6/CT (CLIP) ×8 IMPLANT
COVER PROBE W GEL 5X96 (DRAPES) ×4 IMPLANT
COVER WAND RF STERILE (DRAPES) IMPLANT
DECANTER SPIKE VIAL GLASS SM (MISCELLANEOUS) IMPLANT
DERMABOND ADVANCED (GAUZE/BANDAGES/DRESSINGS) ×2
DERMABOND ADVANCED .7 DNX12 (GAUZE/BANDAGES/DRESSINGS) ×2 IMPLANT
DRESSING PEEL AND PLAC PRVNA20 (GAUZE/BANDAGES/DRESSINGS) ×2 IMPLANT
DRESSING PEEL AND PLC PRVNA 13 (GAUZE/BANDAGES/DRESSINGS) ×2 IMPLANT
DRSG PEEL AND PLACE PREVENA 13 (GAUZE/BANDAGES/DRESSINGS) ×4
DRSG PEEL AND PLACE PREVENA 20 (GAUZE/BANDAGES/DRESSINGS) ×4
ELECT REM PT RETURN 9FT ADLT (ELECTROSURGICAL) ×4
ELECTRODE REM PT RTRN 9FT ADLT (ELECTROSURGICAL) ×2 IMPLANT
GAUZE SPONGE 4X4 16PLY XRAY LF (GAUZE/BANDAGES/DRESSINGS) ×4 IMPLANT
GLOVE SS BIOGEL STRL SZ 7.5 (GLOVE) ×2 IMPLANT
GLOVE SUPERSENSE BIOGEL SZ 7.5 (GLOVE) ×2
GLOVE SURG UNDER POLY LF SZ6.5 (GLOVE) ×8 IMPLANT
GOWN STRL REUS W/ TWL LRG LVL3 (GOWN DISPOSABLE) ×10 IMPLANT
GOWN STRL REUS W/TWL LRG LVL3 (GOWN DISPOSABLE) ×20
HEMOSTAT SNOW SURGICEL 2X4 (HEMOSTASIS) ×4 IMPLANT
KIT BASIN OR (CUSTOM PROCEDURE TRAY) ×4 IMPLANT
KIT DRSG PREVENA PLUS 7DAY 125 (MISCELLANEOUS) ×4 IMPLANT
KIT TURNOVER KIT B (KITS) ×4 IMPLANT
NS IRRIG 1000ML POUR BTL (IV SOLUTION) ×4 IMPLANT
PACK CV ACCESS (CUSTOM PROCEDURE TRAY) ×4 IMPLANT
PAD ARMBOARD 7.5X6 YLW CONV (MISCELLANEOUS) ×8 IMPLANT
SLING ARM FOAM STRAP XLG (SOFTGOODS) ×4 IMPLANT
SUT ETHILON 3 0 PS 1 (SUTURE) ×20 IMPLANT
SUT MNCRL AB 4-0 PS2 18 (SUTURE) ×8 IMPLANT
SUT PROLENE 6 0 CC (SUTURE) ×20 IMPLANT
SUT SILK 2 0 SH (SUTURE) ×8 IMPLANT
SUT VIC AB 2-0 CT1 27 (SUTURE) ×8
SUT VIC AB 2-0 CT1 TAPERPNT 27 (SUTURE) ×4 IMPLANT
SUT VIC AB 3-0 SH 27 (SUTURE) ×8
SUT VIC AB 3-0 SH 27X BRD (SUTURE) ×4 IMPLANT
TOWEL GREEN STERILE (TOWEL DISPOSABLE) ×4 IMPLANT
UNDERPAD 30X36 HEAVY ABSORB (UNDERPADS AND DIAPERS) ×4 IMPLANT
WATER STERILE IRR 1000ML POUR (IV SOLUTION) ×4 IMPLANT

## 2021-03-15 NOTE — Anesthesia Postprocedure Evaluation (Signed)
Anesthesia Post Note  Patient: Peter [name]  Procedure(s) Performed: LEFT UPPER ARM SECOND STAGE BASILIC TRANSPOSITION (Left: Arm Upper) APPLICATION OF WOUND VAC (Right: Arm Upper)     Patient location during evaluation: PACU Anesthesia Type: General Level of consciousness: awake and alert Pain management: pain level controlled Vital Signs Assessment: post-procedure vital signs reviewed and stable Respiratory status: spontaneous breathing, nonlabored ventilation and respiratory function stable Cardiovascular status: blood pressure returned to baseline and stable Postop Assessment: no apparent nausea or vomiting Anesthetic complications: no   No notable events documented.  Last Vitals:  Vitals:   03/15/21 1051 03/15/21 1100  BP: (!) 121/46 132/60  Pulse: 73 71  Resp: 18 18  Temp:  (!) 36.4 C  SpO2: 98% 95%    Last Pain:  Vitals:   03/15/21 0630  TempSrc:   PainSc: 0-No pain                 Audry Pili

## 2021-03-15 NOTE — Anesthesia Procedure Notes (Signed)
Procedure Name: MAC Date/Time: 03/15/2021 7:33 AM Performed by: Janene Harvey, CRNA Pre-anesthesia Checklist: Patient identified, Emergency Drugs available, Suction available and Patient being monitored Patient Re-evaluated:Patient Re-evaluated prior to induction Oxygen Delivery Method: Simple face mask Induction Type: IV induction Placement Confirmation: positive ETCO2 Dental Injury: Teeth and Oropharynx as per pre-operative assessment

## 2021-03-15 NOTE — Transfer of Care (Signed)
Immediate Anesthesia Transfer of Care Note  Patient: Peter [name]  Procedure(s) Performed: LEFT UPPER ARM SECOND STAGE BASILIC TRANSPOSITION (Left: Arm Upper) APPLICATION OF WOUND VAC (Right: Arm Upper)  Patient Location: PACU  Anesthesia Type:General and Regional  Level of Consciousness: drowsy and patient cooperative  Airway & Oxygen Therapy: Patient Spontanous Breathing and Patient connected to face mask oxygen  Post-op Assessment: Report given to RN and Post -op Vital signs reviewed and stable  Post vital signs: Reviewed  Last Vitals:  Vitals Value Taken Time  BP 139/55 03/15/21 1036  Temp    Pulse 73 03/15/21 1038  Resp 19 03/15/21 1038  SpO2 100 % 03/15/21 1038  Vitals shown include unvalidated device data.  Last Pain:  Vitals:   03/15/21 0630  TempSrc:   PainSc: 0-No pain      Patients Stated Pain Goal: 5 (90/38/33 3832)  Complications: No notable events documented.

## 2021-03-15 NOTE — Anesthesia Procedure Notes (Signed)
Procedure Name: LMA Insertion Date/Time: 03/15/2021 8:07 AM Performed by: Janene Harvey, CRNA Pre-anesthesia Checklist: Patient identified, Emergency Drugs available, Suction available and Patient being monitored Patient Re-evaluated:Patient Re-evaluated prior to induction Oxygen Delivery Method: Circle system utilized Preoxygenation: Pre-oxygenation with 100% oxygen Induction Type: IV induction LMA: LMA inserted LMA Size: 4.0 Placement Confirmation: positive ETCO2 Dental Injury: Teeth and Oropharynx as per pre-operative assessment

## 2021-03-15 NOTE — Discharge Instructions (Addendum)
[facility]  Discharge Instructions  AV Fistula or Graft Surgery for Dialysis Access  Please refer to the following instructions for your post-procedure care. Your surgeon or physician assistant will discuss any changes with you.  Activity  You may drive the day following your surgery, if you are comfortable and no longer taking prescription pain medication. Resume full activity as the soreness in your incision resolves.  Bathing/Showering  You may shower after you go home. Keep your incision dry for 48 hours. Do not soak in a bathtub, hot tub, or swim until the incision heals completely. You may not shower if you have a hemodialysis catheter.  Incision Care  Clean your incision with mild soap and water after 48 hours. Pat the area dry with a clean towel. You do not need a bandage unless otherwise instructed. Do not apply any ointments or creams to your incision. You may have skin glue on your incision. Do not peel it off. It will come off on its own in about one week. Your arm may swell a bit after surgery. To reduce swelling use pillows to elevate your arm so it is above your heart. Your doctor will tell you if you need to lightly wrap your arm with an ACE bandage.  Diet  Resume your normal diet. There are not special food restrictions following this procedure. In order to heal from your surgery, it is CRITICAL to get adequate nutrition. Your body requires vitamins, minerals, and protein. Vegetables are the best source of vitamins and minerals. Vegetables also provide the perfect balance of protein. Processed food has little nutritional value, so try to avoid this.  Medications  Resume taking all of your medications. If your incision is causing pain, you may take over-the counter pain relievers such as acetaminophen (Tylenol). If you were prescribed a stronger pain medication, please be aware these medications can cause nausea and constipation. Prevent  nausea by taking the medication with a snack or meal. Avoid constipation by drinking plenty of fluids and eating foods with high amount of fiber, such as fruits, vegetables, and grains.  Do not take Tylenol if you are taking prescription pain medications.  Follow up Your surgeon may want to see you in the office following your access surgery. If so, this will be arranged at the time of your surgery.  Please call us immediately for any of the following conditions:  Increased pain, redness, drainage (pus) from your incision site Fever of 101 degrees or higher Severe or worsening pain at your incision site Hand pain or numbness.  Reduce your risk of vascular disease:  Stop smoking. If you would like help, call [facility] at 1-800-QUIT-NOW 803-561-0751) or [place] [facility] [place] your cholesterol Maintain a desired weight Control your diabetes Keep your blood pressure down  Dialysis  It will take several weeks to several months for your new dialysis access to be ready for use. Your surgeon will determine when it is okay to use it. Your nephrologist will continue to direct your dialysis. You can continue to use your Permcath until your new access is ready for use.   03/15/2021 Peter [name] [id number] 1951/02/03  Surgeon(s): [name], MD  Procedure(s): LEFT UPPER ARM SECOND STAGE BASILIC TRANSPOSITION APPLICATION OF WOUND VAC   May stick graft immediately   May stick graft on designated area only:   X Do not stick left AV fistula for 6 weeks    If you have any  questions, please call the office at [phone].

## 2021-03-15 NOTE — Anesthesia Procedure Notes (Signed)
Anesthesia Regional Block: Supraclavicular block   Pre-Anesthetic Checklist: , timeout performed,  Correct Patient, Correct Site, Correct Laterality,  Correct Procedure, Correct Position, site marked,  Risks and benefits discussed,  Surgical consent,  Pre-op evaluation,  At surgeon's request and post-op pain management  Laterality: Left  Prep: chloraprep       Needles:  Injection technique: Single-shot  Needle Type: Echogenic Needle     Needle Length: 5cm  Needle Gauge: 21     Additional Needles:   Narrative:  Start time: 03/15/2021 7:13 AM End time: 03/15/2021 7:17 AM Injection made incrementally with aspirations every 5 mL.  Performed by: Personally  Anesthesiologist: Audry Pili, MD  Additional Notes: No pain on injection. No increased resistance to injection. Injection made in 5cc increments. Good needle visualization. Patient tolerated the procedure well.

## 2021-03-15 NOTE — Op Note (Signed)
DATE OF SERVICE: 03/15/2021  PATIENT:  Peter [name]  70 y.o. male  PRE-OPERATIVE DIAGNOSIS:  ESRD  POST-OPERATIVE DIAGNOSIS:  Same  PROCEDURE:   Left second stage basilic vein transposition  SURGEON:  Surgeon(s) and Role:    * [name], MD - Primary  ASSISTANT: Paulo Fruit, PA-C  An assistant was required to facilitate exposure and expedite the case.  ANESTHESIA:   regional and general  EBL: min  BLOOD ADMINISTERED:none  DRAINS: none   LOCAL MEDICATIONS USED:  NONE  SPECIMEN:  none  COUNTS: confirmed correct.  TOURNIQUET:  none  PATIENT DISPOSITION:  PACU - hemodynamically stable.   Delay start of Pharmacological VTE agent (>24hrs) due to surgical blood loss or risk of bleeding: no  INDICATION FOR PROCEDURE: Peter [name] is a 70 y.o. male with ESRD in need of permanent dialysis access.  He has failed multiple upper extremity access surgeries to date.  He has suitable left upper extremity basilic vein.  I created a for stage basilic vein transposition for him 01/25/21.  This matured nicely.  After careful discussion of risks, benefits, and alternatives the patient was offered second stage basilic vein transposition. We specifically discussed risk of steal syndrome. The patient understood and wished to proceed.  OPERATIVE FINDINGS: healthy basilic vein fistula. Unremarkable transposition.  DESCRIPTION OF PROCEDURE: After identification of the patient in the pre-operative holding area, the patient was transferred to the operating room. The patient was positioned supine on the operating room table. Anesthesia was induced. The left arm was prepped and draped in standard fashion. A surgical pause was performed confirming correct patient, procedure, and operative location.  Using intraoperative ultrasound the course of the left basilic vein was marked on the skin.  3 skip incisions were made over the course of the basilic vein fistula.  These were carried down  through subcutaneous tissue until the fistula was encountered.  The fascia was skeletonized from the axilla to the anastomosis, taking care to ligate and divide sidebranches, and to protect the medial antebrachial cutaneous nerve.   A subcutaneous tunnel was created over the biceps using a sheath tunneling device.  Patient was heparinized.  The fistula near the anastomosis was clamped.  The outflow in the axilla was clamped.  The fistula was divided.  The fistula was marked to ensure no twisting or kinking while delivering the fistula through the tunnel.  The fistula was tunneled through the arm and delivered near the previous anastomosis.  The fistula was spatulated proximally and distally.  The fistula was reanastomosed end-to-end using continuous running suture of 5-0 Prolene.  A palpable thrill was felt over the subcutaneous course of the tunnel.  A radial pulse was confirmed.  The wounds were copiously irrigated.  Hemostasis was ensured in the surgical bed.  The wounds were closed in layers using 2-O Vicryl, 3-0 Vicryl and 3-O nylon. A prevena vac was applied to the arm.   Upon completion of the case instrument and sharps counts were confirmed correct. The patient was transferred to the PACU in good condition. I was present for all portions of the procedure.  Yevonne Aline. Stanford Breed, MD [facility] Phone Number: [phone] 03/15/2021 10:33 AM

## 2021-03-15 NOTE — Interval H&P Note (Signed)
History and Physical Interval Note:  03/15/2021 7:12 AM  [name]  has presented today for surgery, with the diagnosis of ESRD.  The various methods of treatment have been discussed with the patient and family. After consideration of risks, benefits and other options for treatment, the patient has consented to  Procedure(s): LEFT SECOND STAGE [place] (Left) as a surgical intervention.  The patient's history has been reviewed, patient examined, no change in status, stable for surgery.  I have reviewed the patient's chart and labs.  Questions were answered to the patient's satisfaction.     Peter [name]

## 2021-03-16 ENCOUNTER — Encounter (HOSPITAL_COMMUNITY): Payer: Self-pay | Admitting: Vascular Surgery

## 2021-03-16 DIAGNOSIS — T8249XA Other complication of vascular dialysis catheter, initial encounter: Secondary | ICD-10-CM | POA: Diagnosis not present

## 2021-03-16 DIAGNOSIS — Z992 Dependence on renal dialysis: Secondary | ICD-10-CM | POA: Diagnosis not present

## 2021-03-16 DIAGNOSIS — N186 End stage renal disease: Secondary | ICD-10-CM | POA: Diagnosis not present

## 2021-03-16 DIAGNOSIS — D689 Coagulation defect, unspecified: Secondary | ICD-10-CM | POA: Diagnosis not present

## 2021-03-16 DIAGNOSIS — N2581 Secondary hyperparathyroidism of renal origin: Secondary | ICD-10-CM | POA: Diagnosis not present

## 2021-03-19 DIAGNOSIS — D689 Coagulation defect, unspecified: Secondary | ICD-10-CM | POA: Diagnosis not present

## 2021-03-19 DIAGNOSIS — N186 End stage renal disease: Secondary | ICD-10-CM | POA: Diagnosis not present

## 2021-03-19 DIAGNOSIS — E876 Hypokalemia: Secondary | ICD-10-CM | POA: Diagnosis not present

## 2021-03-19 DIAGNOSIS — Z992 Dependence on renal dialysis: Secondary | ICD-10-CM | POA: Diagnosis not present

## 2021-03-19 DIAGNOSIS — N2581 Secondary hyperparathyroidism of renal origin: Secondary | ICD-10-CM | POA: Diagnosis not present

## 2021-03-19 DIAGNOSIS — T8249XA Other complication of vascular dialysis catheter, initial encounter: Secondary | ICD-10-CM | POA: Diagnosis not present

## 2021-03-20 ENCOUNTER — Other Ambulatory Visit: Payer: Self-pay

## 2021-03-20 ENCOUNTER — Ambulatory Visit (INDEPENDENT_AMBULATORY_CARE_PROVIDER_SITE_OTHER): Payer: Medicare Other | Admitting: Physician Assistant

## 2021-03-20 VITALS — BP 144/69 | HR 80 | Temp 98.0°F | Resp 16 | Ht 67.0 in | Wt 233.0 lb

## 2021-03-20 DIAGNOSIS — N186 End stage renal disease: Secondary | ICD-10-CM

## 2021-03-20 DIAGNOSIS — Z992 Dependence on renal dialysis: Secondary | ICD-10-CM

## 2021-03-20 NOTE — Progress Notes (Signed)
  POST OPERATIVE OFFICE NOTE    CC:  F/u for surgery  HPI:  This is a 70 y.o. male who is s/p Left second stage basilic vein transposition   The wounds were closed in layers using 2-O Vicryl, 3-0 Vicryl and 3-O nylon. A prevena vac was applied to the arm. on 03/15/21 by Dr. Stanford Breed.    Pt returns today for follow up.  Pt denise pain, loss of sensation, or weakness in the left UE.   The prevena is still in place.   Allergies  Allergen Reactions   Penicillins Other (See Comments)    UNSPECIFIED REACTION Childhood allergy Has patient had a PCN reaction causing immediate rash, facial/tongue/throat swelling, SOB or lightheadedness with hypotension: Unknown Has patient had a PCN reaction causing severe rash involving mucus membranes or skin necrosis: No Has patient had a PCN reaction that required hospitalization No Has patient had a PCN reaction occurring within the last 10 years: No If all of the above answers are "NO", then may proceed with Cephalosporin use.     Doxazosin Mesylate     ineffective for BP (10/2014)   Triamterene-Hctz     increased creatinine (see 09/2014 office note)    Current Outpatient Medications  Medication Sig Dispense Refill   amLODipine (NORVASC) 5 MG tablet Take 5 mg by mouth daily.     atorvastatin (LIPITOR) 20 MG tablet Take 20 mg by mouth daily.     cinacalcet (SENSIPAR) 30 MG tablet Take 60 mg by mouth daily.     heparin 1000 unit/mL SOLN injection Heparin Sodium (Porcine) 1,000 Units/mL Catheter Lock Arterial     Methoxy PEG-Epoetin Beta (MIRCERA IJ) Mircera     oxyCODONE-acetaminophen (PERCOCET) 5-325 MG tablet Take 1 tablet by mouth every 6 (six) hours as needed for severe pain. 20 tablet 0   VITAMIN D PO Take 2 tablets by mouth every Monday, Wednesday, and Friday. At dialysis     No current facility-administered medications for this visit.     ROS:  See HPI  Physical Exam:    Incision:  vac removed, small line of blisters superior to the  incision SS fluid from blisters..  Incision intact with nylon sutures.   Extremities:  left hand motor, sensation and palpable radial pulse intact.  Palpable thrill in fistula.    Assessment/Plan:  This is a 70 y.o. male who is s/p: Left BB AV fistula second stage transposition  He will f/u in another week for incision check and possible suture removal.  ABD with small ace to hold dressing in place until blister drainage stops.    He is on HD via right IJ TDC.     Roxy Horseman PA-C [facility] [phone]   Clinic MD:  Stanford Breed

## 2021-03-21 DIAGNOSIS — Z992 Dependence on renal dialysis: Secondary | ICD-10-CM | POA: Diagnosis not present

## 2021-03-21 DIAGNOSIS — N2581 Secondary hyperparathyroidism of renal origin: Secondary | ICD-10-CM | POA: Diagnosis not present

## 2021-03-21 DIAGNOSIS — D689 Coagulation defect, unspecified: Secondary | ICD-10-CM | POA: Diagnosis not present

## 2021-03-21 DIAGNOSIS — E876 Hypokalemia: Secondary | ICD-10-CM | POA: Diagnosis not present

## 2021-03-21 DIAGNOSIS — T8249XA Other complication of vascular dialysis catheter, initial encounter: Secondary | ICD-10-CM | POA: Diagnosis not present

## 2021-03-21 DIAGNOSIS — N186 End stage renal disease: Secondary | ICD-10-CM | POA: Diagnosis not present

## 2021-03-23 DIAGNOSIS — E876 Hypokalemia: Secondary | ICD-10-CM | POA: Diagnosis not present

## 2021-03-23 DIAGNOSIS — T8249XA Other complication of vascular dialysis catheter, initial encounter: Secondary | ICD-10-CM | POA: Diagnosis not present

## 2021-03-23 DIAGNOSIS — N2581 Secondary hyperparathyroidism of renal origin: Secondary | ICD-10-CM | POA: Diagnosis not present

## 2021-03-23 DIAGNOSIS — Z992 Dependence on renal dialysis: Secondary | ICD-10-CM | POA: Diagnosis not present

## 2021-03-23 DIAGNOSIS — D689 Coagulation defect, unspecified: Secondary | ICD-10-CM | POA: Diagnosis not present

## 2021-03-23 DIAGNOSIS — N186 End stage renal disease: Secondary | ICD-10-CM | POA: Diagnosis not present

## 2021-03-26 DIAGNOSIS — N2581 Secondary hyperparathyroidism of renal origin: Secondary | ICD-10-CM | POA: Diagnosis not present

## 2021-03-26 DIAGNOSIS — T8249XA Other complication of vascular dialysis catheter, initial encounter: Secondary | ICD-10-CM | POA: Diagnosis not present

## 2021-03-26 DIAGNOSIS — E876 Hypokalemia: Secondary | ICD-10-CM | POA: Diagnosis not present

## 2021-03-26 DIAGNOSIS — D689 Coagulation defect, unspecified: Secondary | ICD-10-CM | POA: Diagnosis not present

## 2021-03-26 DIAGNOSIS — N186 End stage renal disease: Secondary | ICD-10-CM | POA: Diagnosis not present

## 2021-03-26 DIAGNOSIS — Z992 Dependence on renal dialysis: Secondary | ICD-10-CM | POA: Diagnosis not present

## 2021-03-28 DIAGNOSIS — D689 Coagulation defect, unspecified: Secondary | ICD-10-CM | POA: Diagnosis not present

## 2021-03-28 DIAGNOSIS — N186 End stage renal disease: Secondary | ICD-10-CM | POA: Diagnosis not present

## 2021-03-28 DIAGNOSIS — N2581 Secondary hyperparathyroidism of renal origin: Secondary | ICD-10-CM | POA: Diagnosis not present

## 2021-03-28 DIAGNOSIS — Z992 Dependence on renal dialysis: Secondary | ICD-10-CM | POA: Diagnosis not present

## 2021-03-28 DIAGNOSIS — T8249XA Other complication of vascular dialysis catheter, initial encounter: Secondary | ICD-10-CM | POA: Diagnosis not present

## 2021-03-28 DIAGNOSIS — E876 Hypokalemia: Secondary | ICD-10-CM | POA: Diagnosis not present

## 2021-03-29 ENCOUNTER — Other Ambulatory Visit: Payer: Self-pay

## 2021-03-29 ENCOUNTER — Ambulatory Visit (INDEPENDENT_AMBULATORY_CARE_PROVIDER_SITE_OTHER): Payer: Medicare Other | Admitting: Physician Assistant

## 2021-03-29 ENCOUNTER — Encounter: Payer: Self-pay | Admitting: Physician Assistant

## 2021-03-29 VITALS — BP 176/71 | HR 83 | Temp 98.0°F | Resp 20 | Ht 67.0 in | Wt 234.5 lb

## 2021-03-29 DIAGNOSIS — N186 End stage renal disease: Secondary | ICD-10-CM | POA: Diagnosis not present

## 2021-03-29 DIAGNOSIS — E7800 Pure hypercholesterolemia, unspecified: Secondary | ICD-10-CM | POA: Insufficient documentation

## 2021-03-29 DIAGNOSIS — I770 Arteriovenous fistula, acquired: Secondary | ICD-10-CM | POA: Insufficient documentation

## 2021-03-29 DIAGNOSIS — Z860100 Personal history of colon polyps, unspecified: Secondary | ICD-10-CM | POA: Insufficient documentation

## 2021-03-29 DIAGNOSIS — N185 Chronic kidney disease, stage 5: Secondary | ICD-10-CM | POA: Insufficient documentation

## 2021-03-29 DIAGNOSIS — Z992 Dependence on renal dialysis: Secondary | ICD-10-CM | POA: Diagnosis not present

## 2021-03-29 DIAGNOSIS — N200 Calculus of kidney: Secondary | ICD-10-CM | POA: Insufficient documentation

## 2021-03-29 DIAGNOSIS — E1129 Type 2 diabetes mellitus with other diabetic kidney complication: Secondary | ICD-10-CM | POA: Diagnosis not present

## 2021-03-29 DIAGNOSIS — N529 Male erectile dysfunction, unspecified: Secondary | ICD-10-CM | POA: Insufficient documentation

## 2021-03-29 DIAGNOSIS — E1121 Type 2 diabetes mellitus with diabetic nephropathy: Secondary | ICD-10-CM | POA: Insufficient documentation

## 2021-03-29 DIAGNOSIS — I129 Hypertensive chronic kidney disease with stage 1 through stage 4 chronic kidney disease, or unspecified chronic kidney disease: Secondary | ICD-10-CM | POA: Insufficient documentation

## 2021-03-29 NOTE — Progress Notes (Signed)
    Postoperative Access Visit   History of Present Illness   Peter [name] is a 70 y.o. year old male who presents for postoperative follow-up for: left second stage basilic vein transposition by Dr. Stanford Breed (Date: 03/15/21).  The patient's wounds are healed.  The patient denies steal symptoms.  The patient is able to complete their activities of daily living.  He is dialyzing via R IJ TDC on a MWF schedule at the 3rd [employer] location.   Physical Examination   Vitals:   03/29/21 1417  BP: (!) 176/71  Pulse: 83  Resp: 20  Temp: 98 F (36.7 C)  TempSrc: Temporal  SpO2: 99%  Weight: 234 lb 8 oz (106.4 kg)  Height: 5\' 7"  (1.702 m)   Body mass index is 36.73 kg/m.  left arm Incisions are healed, palpable radial pulse, hand grip is 5/5, sensation in digits is intact, palpable thrill, bruit can be auscultated     Medical Decision Making   Peter [name] is a 70 y.o. year old male who presents s/p left second stage basilic vein transposition  Patent L basilic vein fistula without signs or symptoms of steal syndrome All sutures were removed; there was some incision dehiscence in the axilla.  We re-enforced this with benzoin and steri strips.  I also educated patient and wife on dressing changes and wound care in the event the incision were to open The patient's access will be ready for use 05/14/21 [place] The patient's tunneled dialysis catheter can be removed when Nephrology is comfortable with the performance of the fistula The patient may follow up on a prn basis   Dagoberto Ligas PA-C [facility] [place]: [phone]  Clinic MD: Oneida Alar

## 2021-03-30 DIAGNOSIS — E876 Hypokalemia: Secondary | ICD-10-CM | POA: Diagnosis not present

## 2021-03-30 DIAGNOSIS — N186 End stage renal disease: Secondary | ICD-10-CM | POA: Diagnosis not present

## 2021-03-30 DIAGNOSIS — Z992 Dependence on renal dialysis: Secondary | ICD-10-CM | POA: Diagnosis not present

## 2021-03-30 DIAGNOSIS — N2581 Secondary hyperparathyroidism of renal origin: Secondary | ICD-10-CM | POA: Diagnosis not present

## 2021-03-30 DIAGNOSIS — D689 Coagulation defect, unspecified: Secondary | ICD-10-CM | POA: Diagnosis not present

## 2021-03-30 DIAGNOSIS — T8249XA Other complication of vascular dialysis catheter, initial encounter: Secondary | ICD-10-CM | POA: Diagnosis not present

## 2021-04-02 DIAGNOSIS — E876 Hypokalemia: Secondary | ICD-10-CM | POA: Diagnosis not present

## 2021-04-02 DIAGNOSIS — N186 End stage renal disease: Secondary | ICD-10-CM | POA: Diagnosis not present

## 2021-04-02 DIAGNOSIS — Z992 Dependence on renal dialysis: Secondary | ICD-10-CM | POA: Diagnosis not present

## 2021-04-02 DIAGNOSIS — T8249XA Other complication of vascular dialysis catheter, initial encounter: Secondary | ICD-10-CM | POA: Diagnosis not present

## 2021-04-02 DIAGNOSIS — N2581 Secondary hyperparathyroidism of renal origin: Secondary | ICD-10-CM | POA: Diagnosis not present

## 2021-04-02 DIAGNOSIS — D689 Coagulation defect, unspecified: Secondary | ICD-10-CM | POA: Diagnosis not present

## 2021-04-04 DIAGNOSIS — T8249XA Other complication of vascular dialysis catheter, initial encounter: Secondary | ICD-10-CM | POA: Diagnosis not present

## 2021-04-04 DIAGNOSIS — E876 Hypokalemia: Secondary | ICD-10-CM | POA: Diagnosis not present

## 2021-04-04 DIAGNOSIS — D689 Coagulation defect, unspecified: Secondary | ICD-10-CM | POA: Diagnosis not present

## 2021-04-04 DIAGNOSIS — N2581 Secondary hyperparathyroidism of renal origin: Secondary | ICD-10-CM | POA: Diagnosis not present

## 2021-04-04 DIAGNOSIS — N186 End stage renal disease: Secondary | ICD-10-CM | POA: Diagnosis not present

## 2021-04-04 DIAGNOSIS — Z992 Dependence on renal dialysis: Secondary | ICD-10-CM | POA: Diagnosis not present

## 2021-04-06 DIAGNOSIS — E876 Hypokalemia: Secondary | ICD-10-CM | POA: Diagnosis not present

## 2021-04-06 DIAGNOSIS — N186 End stage renal disease: Secondary | ICD-10-CM | POA: Diagnosis not present

## 2021-04-06 DIAGNOSIS — T8249XA Other complication of vascular dialysis catheter, initial encounter: Secondary | ICD-10-CM | POA: Diagnosis not present

## 2021-04-06 DIAGNOSIS — Z992 Dependence on renal dialysis: Secondary | ICD-10-CM | POA: Diagnosis not present

## 2021-04-06 DIAGNOSIS — D689 Coagulation defect, unspecified: Secondary | ICD-10-CM | POA: Diagnosis not present

## 2021-04-06 DIAGNOSIS — N2581 Secondary hyperparathyroidism of renal origin: Secondary | ICD-10-CM | POA: Diagnosis not present

## 2021-04-09 DIAGNOSIS — N186 End stage renal disease: Secondary | ICD-10-CM | POA: Diagnosis not present

## 2021-04-09 DIAGNOSIS — E876 Hypokalemia: Secondary | ICD-10-CM | POA: Diagnosis not present

## 2021-04-09 DIAGNOSIS — Z992 Dependence on renal dialysis: Secondary | ICD-10-CM | POA: Diagnosis not present

## 2021-04-09 DIAGNOSIS — D689 Coagulation defect, unspecified: Secondary | ICD-10-CM | POA: Diagnosis not present

## 2021-04-09 DIAGNOSIS — N2581 Secondary hyperparathyroidism of renal origin: Secondary | ICD-10-CM | POA: Diagnosis not present

## 2021-04-09 DIAGNOSIS — T8249XA Other complication of vascular dialysis catheter, initial encounter: Secondary | ICD-10-CM | POA: Diagnosis not present

## 2021-04-11 DIAGNOSIS — T8249XA Other complication of vascular dialysis catheter, initial encounter: Secondary | ICD-10-CM | POA: Diagnosis not present

## 2021-04-11 DIAGNOSIS — N186 End stage renal disease: Secondary | ICD-10-CM | POA: Diagnosis not present

## 2021-04-11 DIAGNOSIS — Z992 Dependence on renal dialysis: Secondary | ICD-10-CM | POA: Diagnosis not present

## 2021-04-11 DIAGNOSIS — N2581 Secondary hyperparathyroidism of renal origin: Secondary | ICD-10-CM | POA: Diagnosis not present

## 2021-04-11 DIAGNOSIS — D689 Coagulation defect, unspecified: Secondary | ICD-10-CM | POA: Diagnosis not present

## 2021-04-11 DIAGNOSIS — E876 Hypokalemia: Secondary | ICD-10-CM | POA: Diagnosis not present

## 2021-04-13 DIAGNOSIS — N186 End stage renal disease: Secondary | ICD-10-CM | POA: Diagnosis not present

## 2021-04-13 DIAGNOSIS — D689 Coagulation defect, unspecified: Secondary | ICD-10-CM | POA: Diagnosis not present

## 2021-04-13 DIAGNOSIS — Z992 Dependence on renal dialysis: Secondary | ICD-10-CM | POA: Diagnosis not present

## 2021-04-13 DIAGNOSIS — N2581 Secondary hyperparathyroidism of renal origin: Secondary | ICD-10-CM | POA: Diagnosis not present

## 2021-04-13 DIAGNOSIS — T8249XA Other complication of vascular dialysis catheter, initial encounter: Secondary | ICD-10-CM | POA: Diagnosis not present

## 2021-04-13 DIAGNOSIS — E876 Hypokalemia: Secondary | ICD-10-CM | POA: Diagnosis not present

## 2021-04-16 DIAGNOSIS — Z992 Dependence on renal dialysis: Secondary | ICD-10-CM | POA: Diagnosis not present

## 2021-04-16 DIAGNOSIS — N186 End stage renal disease: Secondary | ICD-10-CM | POA: Diagnosis not present

## 2021-04-16 DIAGNOSIS — D689 Coagulation defect, unspecified: Secondary | ICD-10-CM | POA: Diagnosis not present

## 2021-04-16 DIAGNOSIS — T8249XA Other complication of vascular dialysis catheter, initial encounter: Secondary | ICD-10-CM | POA: Diagnosis not present

## 2021-04-16 DIAGNOSIS — N2581 Secondary hyperparathyroidism of renal origin: Secondary | ICD-10-CM | POA: Diagnosis not present

## 2021-04-16 DIAGNOSIS — E876 Hypokalemia: Secondary | ICD-10-CM | POA: Diagnosis not present

## 2021-04-18 DIAGNOSIS — N186 End stage renal disease: Secondary | ICD-10-CM | POA: Diagnosis not present

## 2021-04-18 DIAGNOSIS — T8249XA Other complication of vascular dialysis catheter, initial encounter: Secondary | ICD-10-CM | POA: Diagnosis not present

## 2021-04-18 DIAGNOSIS — E876 Hypokalemia: Secondary | ICD-10-CM | POA: Diagnosis not present

## 2021-04-18 DIAGNOSIS — D689 Coagulation defect, unspecified: Secondary | ICD-10-CM | POA: Diagnosis not present

## 2021-04-18 DIAGNOSIS — Z992 Dependence on renal dialysis: Secondary | ICD-10-CM | POA: Diagnosis not present

## 2021-04-18 DIAGNOSIS — N2581 Secondary hyperparathyroidism of renal origin: Secondary | ICD-10-CM | POA: Diagnosis not present

## 2021-04-20 DIAGNOSIS — D689 Coagulation defect, unspecified: Secondary | ICD-10-CM | POA: Diagnosis not present

## 2021-04-20 DIAGNOSIS — N2581 Secondary hyperparathyroidism of renal origin: Secondary | ICD-10-CM | POA: Diagnosis not present

## 2021-04-20 DIAGNOSIS — T8249XA Other complication of vascular dialysis catheter, initial encounter: Secondary | ICD-10-CM | POA: Diagnosis not present

## 2021-04-20 DIAGNOSIS — E876 Hypokalemia: Secondary | ICD-10-CM | POA: Diagnosis not present

## 2021-04-20 DIAGNOSIS — Z992 Dependence on renal dialysis: Secondary | ICD-10-CM | POA: Diagnosis not present

## 2021-04-20 DIAGNOSIS — N186 End stage renal disease: Secondary | ICD-10-CM | POA: Diagnosis not present

## 2021-04-23 DIAGNOSIS — D689 Coagulation defect, unspecified: Secondary | ICD-10-CM | POA: Diagnosis not present

## 2021-04-23 DIAGNOSIS — N186 End stage renal disease: Secondary | ICD-10-CM | POA: Diagnosis not present

## 2021-04-23 DIAGNOSIS — Z992 Dependence on renal dialysis: Secondary | ICD-10-CM | POA: Diagnosis not present

## 2021-04-23 DIAGNOSIS — E876 Hypokalemia: Secondary | ICD-10-CM | POA: Diagnosis not present

## 2021-04-23 DIAGNOSIS — N2581 Secondary hyperparathyroidism of renal origin: Secondary | ICD-10-CM | POA: Diagnosis not present

## 2021-04-23 DIAGNOSIS — T8249XA Other complication of vascular dialysis catheter, initial encounter: Secondary | ICD-10-CM | POA: Diagnosis not present

## 2021-04-25 DIAGNOSIS — T8249XA Other complication of vascular dialysis catheter, initial encounter: Secondary | ICD-10-CM | POA: Diagnosis not present

## 2021-04-25 DIAGNOSIS — D689 Coagulation defect, unspecified: Secondary | ICD-10-CM | POA: Diagnosis not present

## 2021-04-25 DIAGNOSIS — Z992 Dependence on renal dialysis: Secondary | ICD-10-CM | POA: Diagnosis not present

## 2021-04-25 DIAGNOSIS — N2581 Secondary hyperparathyroidism of renal origin: Secondary | ICD-10-CM | POA: Diagnosis not present

## 2021-04-25 DIAGNOSIS — E876 Hypokalemia: Secondary | ICD-10-CM | POA: Diagnosis not present

## 2021-04-25 DIAGNOSIS — N186 End stage renal disease: Secondary | ICD-10-CM | POA: Diagnosis not present

## 2021-04-27 DIAGNOSIS — N186 End stage renal disease: Secondary | ICD-10-CM | POA: Diagnosis not present

## 2021-04-27 DIAGNOSIS — N2581 Secondary hyperparathyroidism of renal origin: Secondary | ICD-10-CM | POA: Diagnosis not present

## 2021-04-27 DIAGNOSIS — T8249XA Other complication of vascular dialysis catheter, initial encounter: Secondary | ICD-10-CM | POA: Diagnosis not present

## 2021-04-27 DIAGNOSIS — Z992 Dependence on renal dialysis: Secondary | ICD-10-CM | POA: Diagnosis not present

## 2021-04-27 DIAGNOSIS — D689 Coagulation defect, unspecified: Secondary | ICD-10-CM | POA: Diagnosis not present

## 2021-04-27 DIAGNOSIS — E876 Hypokalemia: Secondary | ICD-10-CM | POA: Diagnosis not present

## 2021-04-29 DIAGNOSIS — E1129 Type 2 diabetes mellitus with other diabetic kidney complication: Secondary | ICD-10-CM | POA: Diagnosis not present

## 2021-04-29 DIAGNOSIS — Z992 Dependence on renal dialysis: Secondary | ICD-10-CM | POA: Diagnosis not present

## 2021-04-29 DIAGNOSIS — N186 End stage renal disease: Secondary | ICD-10-CM | POA: Diagnosis not present

## 2021-04-30 DIAGNOSIS — N2581 Secondary hyperparathyroidism of renal origin: Secondary | ICD-10-CM | POA: Diagnosis not present

## 2021-04-30 DIAGNOSIS — D689 Coagulation defect, unspecified: Secondary | ICD-10-CM | POA: Diagnosis not present

## 2021-04-30 DIAGNOSIS — T8249XA Other complication of vascular dialysis catheter, initial encounter: Secondary | ICD-10-CM | POA: Diagnosis not present

## 2021-04-30 DIAGNOSIS — Z992 Dependence on renal dialysis: Secondary | ICD-10-CM | POA: Diagnosis not present

## 2021-04-30 DIAGNOSIS — N186 End stage renal disease: Secondary | ICD-10-CM | POA: Diagnosis not present

## 2021-04-30 DIAGNOSIS — E876 Hypokalemia: Secondary | ICD-10-CM | POA: Diagnosis not present

## 2021-05-02 DIAGNOSIS — E876 Hypokalemia: Secondary | ICD-10-CM | POA: Diagnosis not present

## 2021-05-02 DIAGNOSIS — D689 Coagulation defect, unspecified: Secondary | ICD-10-CM | POA: Diagnosis not present

## 2021-05-02 DIAGNOSIS — N2581 Secondary hyperparathyroidism of renal origin: Secondary | ICD-10-CM | POA: Diagnosis not present

## 2021-05-02 DIAGNOSIS — Z992 Dependence on renal dialysis: Secondary | ICD-10-CM | POA: Diagnosis not present

## 2021-05-02 DIAGNOSIS — T8249XA Other complication of vascular dialysis catheter, initial encounter: Secondary | ICD-10-CM | POA: Diagnosis not present

## 2021-05-02 DIAGNOSIS — N186 End stage renal disease: Secondary | ICD-10-CM | POA: Diagnosis not present

## 2021-05-04 DIAGNOSIS — N186 End stage renal disease: Secondary | ICD-10-CM | POA: Diagnosis not present

## 2021-05-04 DIAGNOSIS — E876 Hypokalemia: Secondary | ICD-10-CM | POA: Diagnosis not present

## 2021-05-04 DIAGNOSIS — D689 Coagulation defect, unspecified: Secondary | ICD-10-CM | POA: Diagnosis not present

## 2021-05-04 DIAGNOSIS — T8249XA Other complication of vascular dialysis catheter, initial encounter: Secondary | ICD-10-CM | POA: Diagnosis not present

## 2021-05-04 DIAGNOSIS — Z992 Dependence on renal dialysis: Secondary | ICD-10-CM | POA: Diagnosis not present

## 2021-05-04 DIAGNOSIS — N2581 Secondary hyperparathyroidism of renal origin: Secondary | ICD-10-CM | POA: Diagnosis not present

## 2021-05-08 DIAGNOSIS — N186 End stage renal disease: Secondary | ICD-10-CM | POA: Diagnosis not present

## 2021-05-08 DIAGNOSIS — Z992 Dependence on renal dialysis: Secondary | ICD-10-CM | POA: Diagnosis not present

## 2021-05-10 DIAGNOSIS — Z992 Dependence on renal dialysis: Secondary | ICD-10-CM | POA: Diagnosis not present

## 2021-05-10 DIAGNOSIS — N186 End stage renal disease: Secondary | ICD-10-CM | POA: Diagnosis not present

## 2021-05-14 DIAGNOSIS — N2581 Secondary hyperparathyroidism of renal origin: Secondary | ICD-10-CM | POA: Diagnosis not present

## 2021-05-14 DIAGNOSIS — E876 Hypokalemia: Secondary | ICD-10-CM | POA: Diagnosis not present

## 2021-05-14 DIAGNOSIS — N186 End stage renal disease: Secondary | ICD-10-CM | POA: Diagnosis not present

## 2021-05-14 DIAGNOSIS — Z992 Dependence on renal dialysis: Secondary | ICD-10-CM | POA: Diagnosis not present

## 2021-05-14 DIAGNOSIS — T8249XA Other complication of vascular dialysis catheter, initial encounter: Secondary | ICD-10-CM | POA: Diagnosis not present

## 2021-05-14 DIAGNOSIS — D689 Coagulation defect, unspecified: Secondary | ICD-10-CM | POA: Diagnosis not present

## 2021-05-16 DIAGNOSIS — E876 Hypokalemia: Secondary | ICD-10-CM | POA: Diagnosis not present

## 2021-05-16 DIAGNOSIS — T8249XA Other complication of vascular dialysis catheter, initial encounter: Secondary | ICD-10-CM | POA: Diagnosis not present

## 2021-05-16 DIAGNOSIS — D689 Coagulation defect, unspecified: Secondary | ICD-10-CM | POA: Diagnosis not present

## 2021-05-16 DIAGNOSIS — N2581 Secondary hyperparathyroidism of renal origin: Secondary | ICD-10-CM | POA: Diagnosis not present

## 2021-05-16 DIAGNOSIS — N186 End stage renal disease: Secondary | ICD-10-CM | POA: Diagnosis not present

## 2021-05-16 DIAGNOSIS — Z992 Dependence on renal dialysis: Secondary | ICD-10-CM | POA: Diagnosis not present

## 2021-05-18 DIAGNOSIS — T8249XA Other complication of vascular dialysis catheter, initial encounter: Secondary | ICD-10-CM | POA: Diagnosis not present

## 2021-05-18 DIAGNOSIS — N186 End stage renal disease: Secondary | ICD-10-CM | POA: Diagnosis not present

## 2021-05-18 DIAGNOSIS — N2581 Secondary hyperparathyroidism of renal origin: Secondary | ICD-10-CM | POA: Diagnosis not present

## 2021-05-18 DIAGNOSIS — Z992 Dependence on renal dialysis: Secondary | ICD-10-CM | POA: Diagnosis not present

## 2021-05-18 DIAGNOSIS — E876 Hypokalemia: Secondary | ICD-10-CM | POA: Diagnosis not present

## 2021-05-18 DIAGNOSIS — D689 Coagulation defect, unspecified: Secondary | ICD-10-CM | POA: Diagnosis not present

## 2021-05-21 DIAGNOSIS — T8249XA Other complication of vascular dialysis catheter, initial encounter: Secondary | ICD-10-CM | POA: Diagnosis not present

## 2021-05-21 DIAGNOSIS — D689 Coagulation defect, unspecified: Secondary | ICD-10-CM | POA: Diagnosis not present

## 2021-05-21 DIAGNOSIS — N186 End stage renal disease: Secondary | ICD-10-CM | POA: Diagnosis not present

## 2021-05-21 DIAGNOSIS — E876 Hypokalemia: Secondary | ICD-10-CM | POA: Diagnosis not present

## 2021-05-21 DIAGNOSIS — N2581 Secondary hyperparathyroidism of renal origin: Secondary | ICD-10-CM | POA: Diagnosis not present

## 2021-05-21 DIAGNOSIS — Z992 Dependence on renal dialysis: Secondary | ICD-10-CM | POA: Diagnosis not present

## 2021-05-23 DIAGNOSIS — D689 Coagulation defect, unspecified: Secondary | ICD-10-CM | POA: Diagnosis not present

## 2021-05-23 DIAGNOSIS — E876 Hypokalemia: Secondary | ICD-10-CM | POA: Diagnosis not present

## 2021-05-23 DIAGNOSIS — Z992 Dependence on renal dialysis: Secondary | ICD-10-CM | POA: Diagnosis not present

## 2021-05-23 DIAGNOSIS — N186 End stage renal disease: Secondary | ICD-10-CM | POA: Diagnosis not present

## 2021-05-23 DIAGNOSIS — N2581 Secondary hyperparathyroidism of renal origin: Secondary | ICD-10-CM | POA: Diagnosis not present

## 2021-05-23 DIAGNOSIS — T8249XA Other complication of vascular dialysis catheter, initial encounter: Secondary | ICD-10-CM | POA: Diagnosis not present

## 2021-05-25 DIAGNOSIS — N186 End stage renal disease: Secondary | ICD-10-CM | POA: Diagnosis not present

## 2021-05-25 DIAGNOSIS — D689 Coagulation defect, unspecified: Secondary | ICD-10-CM | POA: Diagnosis not present

## 2021-05-25 DIAGNOSIS — Z992 Dependence on renal dialysis: Secondary | ICD-10-CM | POA: Diagnosis not present

## 2021-05-25 DIAGNOSIS — E876 Hypokalemia: Secondary | ICD-10-CM | POA: Diagnosis not present

## 2021-05-25 DIAGNOSIS — T8249XA Other complication of vascular dialysis catheter, initial encounter: Secondary | ICD-10-CM | POA: Diagnosis not present

## 2021-05-25 DIAGNOSIS — N2581 Secondary hyperparathyroidism of renal origin: Secondary | ICD-10-CM | POA: Diagnosis not present

## 2021-05-28 DIAGNOSIS — T8249XA Other complication of vascular dialysis catheter, initial encounter: Secondary | ICD-10-CM | POA: Diagnosis not present

## 2021-05-28 DIAGNOSIS — E876 Hypokalemia: Secondary | ICD-10-CM | POA: Diagnosis not present

## 2021-05-28 DIAGNOSIS — N2581 Secondary hyperparathyroidism of renal origin: Secondary | ICD-10-CM | POA: Diagnosis not present

## 2021-05-28 DIAGNOSIS — Z992 Dependence on renal dialysis: Secondary | ICD-10-CM | POA: Diagnosis not present

## 2021-05-28 DIAGNOSIS — N186 End stage renal disease: Secondary | ICD-10-CM | POA: Diagnosis not present

## 2021-05-28 DIAGNOSIS — D689 Coagulation defect, unspecified: Secondary | ICD-10-CM | POA: Diagnosis not present

## 2021-05-30 DIAGNOSIS — E876 Hypokalemia: Secondary | ICD-10-CM | POA: Diagnosis not present

## 2021-05-30 DIAGNOSIS — T8249XA Other complication of vascular dialysis catheter, initial encounter: Secondary | ICD-10-CM | POA: Diagnosis not present

## 2021-05-30 DIAGNOSIS — D689 Coagulation defect, unspecified: Secondary | ICD-10-CM | POA: Diagnosis not present

## 2021-05-30 DIAGNOSIS — N186 End stage renal disease: Secondary | ICD-10-CM | POA: Diagnosis not present

## 2021-05-30 DIAGNOSIS — Z992 Dependence on renal dialysis: Secondary | ICD-10-CM | POA: Diagnosis not present

## 2021-05-30 DIAGNOSIS — E1129 Type 2 diabetes mellitus with other diabetic kidney complication: Secondary | ICD-10-CM | POA: Diagnosis not present

## 2021-05-30 DIAGNOSIS — N2581 Secondary hyperparathyroidism of renal origin: Secondary | ICD-10-CM | POA: Diagnosis not present

## 2021-06-01 DIAGNOSIS — Z992 Dependence on renal dialysis: Secondary | ICD-10-CM | POA: Diagnosis not present

## 2021-06-01 DIAGNOSIS — N2581 Secondary hyperparathyroidism of renal origin: Secondary | ICD-10-CM | POA: Diagnosis not present

## 2021-06-01 DIAGNOSIS — D509 Iron deficiency anemia, unspecified: Secondary | ICD-10-CM | POA: Diagnosis not present

## 2021-06-01 DIAGNOSIS — E876 Hypokalemia: Secondary | ICD-10-CM | POA: Diagnosis not present

## 2021-06-01 DIAGNOSIS — N186 End stage renal disease: Secondary | ICD-10-CM | POA: Diagnosis not present

## 2021-06-01 DIAGNOSIS — D689 Coagulation defect, unspecified: Secondary | ICD-10-CM | POA: Diagnosis not present

## 2021-06-01 DIAGNOSIS — T8249XA Other complication of vascular dialysis catheter, initial encounter: Secondary | ICD-10-CM | POA: Diagnosis not present

## 2021-06-04 DIAGNOSIS — N186 End stage renal disease: Secondary | ICD-10-CM | POA: Diagnosis not present

## 2021-06-04 DIAGNOSIS — T8249XA Other complication of vascular dialysis catheter, initial encounter: Secondary | ICD-10-CM | POA: Diagnosis not present

## 2021-06-04 DIAGNOSIS — D509 Iron deficiency anemia, unspecified: Secondary | ICD-10-CM | POA: Diagnosis not present

## 2021-06-04 DIAGNOSIS — E876 Hypokalemia: Secondary | ICD-10-CM | POA: Diagnosis not present

## 2021-06-04 DIAGNOSIS — Z992 Dependence on renal dialysis: Secondary | ICD-10-CM | POA: Diagnosis not present

## 2021-06-04 DIAGNOSIS — D689 Coagulation defect, unspecified: Secondary | ICD-10-CM | POA: Diagnosis not present

## 2021-06-04 DIAGNOSIS — N2581 Secondary hyperparathyroidism of renal origin: Secondary | ICD-10-CM | POA: Diagnosis not present

## 2021-06-05 DIAGNOSIS — E119 Type 2 diabetes mellitus without complications: Secondary | ICD-10-CM | POA: Diagnosis not present

## 2021-06-05 DIAGNOSIS — H25813 Combined forms of age-related cataract, bilateral: Secondary | ICD-10-CM | POA: Diagnosis not present

## 2021-06-05 DIAGNOSIS — H11442 Conjunctival cysts, left eye: Secondary | ICD-10-CM | POA: Diagnosis not present

## 2021-06-05 DIAGNOSIS — H43811 Vitreous degeneration, right eye: Secondary | ICD-10-CM | POA: Diagnosis not present

## 2021-06-06 DIAGNOSIS — N186 End stage renal disease: Secondary | ICD-10-CM | POA: Diagnosis not present

## 2021-06-06 DIAGNOSIS — N2581 Secondary hyperparathyroidism of renal origin: Secondary | ICD-10-CM | POA: Diagnosis not present

## 2021-06-06 DIAGNOSIS — D509 Iron deficiency anemia, unspecified: Secondary | ICD-10-CM | POA: Diagnosis not present

## 2021-06-06 DIAGNOSIS — Z992 Dependence on renal dialysis: Secondary | ICD-10-CM | POA: Diagnosis not present

## 2021-06-06 DIAGNOSIS — D689 Coagulation defect, unspecified: Secondary | ICD-10-CM | POA: Diagnosis not present

## 2021-06-06 DIAGNOSIS — T8249XA Other complication of vascular dialysis catheter, initial encounter: Secondary | ICD-10-CM | POA: Diagnosis not present

## 2021-06-06 DIAGNOSIS — E876 Hypokalemia: Secondary | ICD-10-CM | POA: Diagnosis not present

## 2021-06-08 DIAGNOSIS — N2581 Secondary hyperparathyroidism of renal origin: Secondary | ICD-10-CM | POA: Diagnosis not present

## 2021-06-08 DIAGNOSIS — T8249XA Other complication of vascular dialysis catheter, initial encounter: Secondary | ICD-10-CM | POA: Diagnosis not present

## 2021-06-08 DIAGNOSIS — E876 Hypokalemia: Secondary | ICD-10-CM | POA: Diagnosis not present

## 2021-06-08 DIAGNOSIS — Z992 Dependence on renal dialysis: Secondary | ICD-10-CM | POA: Diagnosis not present

## 2021-06-08 DIAGNOSIS — N186 End stage renal disease: Secondary | ICD-10-CM | POA: Diagnosis not present

## 2021-06-08 DIAGNOSIS — D689 Coagulation defect, unspecified: Secondary | ICD-10-CM | POA: Diagnosis not present

## 2021-06-08 DIAGNOSIS — D509 Iron deficiency anemia, unspecified: Secondary | ICD-10-CM | POA: Diagnosis not present

## 2021-06-11 DIAGNOSIS — Z992 Dependence on renal dialysis: Secondary | ICD-10-CM | POA: Diagnosis not present

## 2021-06-11 DIAGNOSIS — N186 End stage renal disease: Secondary | ICD-10-CM | POA: Diagnosis not present

## 2021-06-11 DIAGNOSIS — D509 Iron deficiency anemia, unspecified: Secondary | ICD-10-CM | POA: Diagnosis not present

## 2021-06-11 DIAGNOSIS — N2581 Secondary hyperparathyroidism of renal origin: Secondary | ICD-10-CM | POA: Diagnosis not present

## 2021-06-11 DIAGNOSIS — E876 Hypokalemia: Secondary | ICD-10-CM | POA: Diagnosis not present

## 2021-06-11 DIAGNOSIS — T8249XA Other complication of vascular dialysis catheter, initial encounter: Secondary | ICD-10-CM | POA: Diagnosis not present

## 2021-06-11 DIAGNOSIS — D689 Coagulation defect, unspecified: Secondary | ICD-10-CM | POA: Diagnosis not present

## 2021-06-13 DIAGNOSIS — N2581 Secondary hyperparathyroidism of renal origin: Secondary | ICD-10-CM | POA: Diagnosis not present

## 2021-06-13 DIAGNOSIS — Z992 Dependence on renal dialysis: Secondary | ICD-10-CM | POA: Diagnosis not present

## 2021-06-13 DIAGNOSIS — D509 Iron deficiency anemia, unspecified: Secondary | ICD-10-CM | POA: Diagnosis not present

## 2021-06-13 DIAGNOSIS — T8249XA Other complication of vascular dialysis catheter, initial encounter: Secondary | ICD-10-CM | POA: Diagnosis not present

## 2021-06-13 DIAGNOSIS — N186 End stage renal disease: Secondary | ICD-10-CM | POA: Diagnosis not present

## 2021-06-13 DIAGNOSIS — D689 Coagulation defect, unspecified: Secondary | ICD-10-CM | POA: Diagnosis not present

## 2021-06-13 DIAGNOSIS — E876 Hypokalemia: Secondary | ICD-10-CM | POA: Diagnosis not present

## 2021-06-14 DIAGNOSIS — I770 Arteriovenous fistula, acquired: Secondary | ICD-10-CM | POA: Diagnosis not present

## 2021-06-14 DIAGNOSIS — Z0000 Encounter for general adult medical examination without abnormal findings: Secondary | ICD-10-CM | POA: Diagnosis not present

## 2021-06-14 DIAGNOSIS — N185 Chronic kidney disease, stage 5: Secondary | ICD-10-CM | POA: Diagnosis not present

## 2021-06-14 DIAGNOSIS — E7800 Pure hypercholesterolemia, unspecified: Secondary | ICD-10-CM | POA: Diagnosis not present

## 2021-06-14 DIAGNOSIS — I129 Hypertensive chronic kidney disease with stage 1 through stage 4 chronic kidney disease, or unspecified chronic kidney disease: Secondary | ICD-10-CM | POA: Diagnosis not present

## 2021-06-14 DIAGNOSIS — G4733 Obstructive sleep apnea (adult) (pediatric): Secondary | ICD-10-CM | POA: Diagnosis not present

## 2021-06-14 DIAGNOSIS — Z79899 Other long term (current) drug therapy: Secondary | ICD-10-CM | POA: Diagnosis not present

## 2021-06-14 DIAGNOSIS — E1129 Type 2 diabetes mellitus with other diabetic kidney complication: Secondary | ICD-10-CM | POA: Diagnosis not present

## 2021-06-14 DIAGNOSIS — Z1389 Encounter for screening for other disorder: Secondary | ICD-10-CM | POA: Diagnosis not present

## 2021-06-14 DIAGNOSIS — N2581 Secondary hyperparathyroidism of renal origin: Secondary | ICD-10-CM | POA: Diagnosis not present

## 2021-06-14 DIAGNOSIS — D692 Other nonthrombocytopenic purpura: Secondary | ICD-10-CM | POA: Diagnosis not present

## 2021-06-15 DIAGNOSIS — N2581 Secondary hyperparathyroidism of renal origin: Secondary | ICD-10-CM | POA: Diagnosis not present

## 2021-06-15 DIAGNOSIS — T8249XA Other complication of vascular dialysis catheter, initial encounter: Secondary | ICD-10-CM | POA: Diagnosis not present

## 2021-06-15 DIAGNOSIS — N186 End stage renal disease: Secondary | ICD-10-CM | POA: Diagnosis not present

## 2021-06-15 DIAGNOSIS — Z992 Dependence on renal dialysis: Secondary | ICD-10-CM | POA: Diagnosis not present

## 2021-06-15 DIAGNOSIS — D509 Iron deficiency anemia, unspecified: Secondary | ICD-10-CM | POA: Diagnosis not present

## 2021-06-15 DIAGNOSIS — E876 Hypokalemia: Secondary | ICD-10-CM | POA: Diagnosis not present

## 2021-06-15 DIAGNOSIS — D689 Coagulation defect, unspecified: Secondary | ICD-10-CM | POA: Diagnosis not present

## 2021-06-18 DIAGNOSIS — Z992 Dependence on renal dialysis: Secondary | ICD-10-CM | POA: Diagnosis not present

## 2021-06-18 DIAGNOSIS — E876 Hypokalemia: Secondary | ICD-10-CM | POA: Diagnosis not present

## 2021-06-18 DIAGNOSIS — T8249XA Other complication of vascular dialysis catheter, initial encounter: Secondary | ICD-10-CM | POA: Diagnosis not present

## 2021-06-18 DIAGNOSIS — N186 End stage renal disease: Secondary | ICD-10-CM | POA: Diagnosis not present

## 2021-06-18 DIAGNOSIS — D509 Iron deficiency anemia, unspecified: Secondary | ICD-10-CM | POA: Diagnosis not present

## 2021-06-18 DIAGNOSIS — D689 Coagulation defect, unspecified: Secondary | ICD-10-CM | POA: Diagnosis not present

## 2021-06-18 DIAGNOSIS — N2581 Secondary hyperparathyroidism of renal origin: Secondary | ICD-10-CM | POA: Diagnosis not present

## 2021-06-20 DIAGNOSIS — D689 Coagulation defect, unspecified: Secondary | ICD-10-CM | POA: Diagnosis not present

## 2021-06-20 DIAGNOSIS — N186 End stage renal disease: Secondary | ICD-10-CM | POA: Diagnosis not present

## 2021-06-20 DIAGNOSIS — Z992 Dependence on renal dialysis: Secondary | ICD-10-CM | POA: Diagnosis not present

## 2021-06-20 DIAGNOSIS — E876 Hypokalemia: Secondary | ICD-10-CM | POA: Diagnosis not present

## 2021-06-20 DIAGNOSIS — T8249XA Other complication of vascular dialysis catheter, initial encounter: Secondary | ICD-10-CM | POA: Diagnosis not present

## 2021-06-20 DIAGNOSIS — N2581 Secondary hyperparathyroidism of renal origin: Secondary | ICD-10-CM | POA: Diagnosis not present

## 2021-06-20 DIAGNOSIS — D509 Iron deficiency anemia, unspecified: Secondary | ICD-10-CM | POA: Diagnosis not present

## 2021-06-21 DIAGNOSIS — Z992 Dependence on renal dialysis: Secondary | ICD-10-CM | POA: Diagnosis not present

## 2021-06-21 DIAGNOSIS — Z452 Encounter for adjustment and management of vascular access device: Secondary | ICD-10-CM | POA: Diagnosis not present

## 2021-06-21 DIAGNOSIS — N186 End stage renal disease: Secondary | ICD-10-CM | POA: Diagnosis not present

## 2021-06-22 DIAGNOSIS — D689 Coagulation defect, unspecified: Secondary | ICD-10-CM | POA: Diagnosis not present

## 2021-06-22 DIAGNOSIS — E876 Hypokalemia: Secondary | ICD-10-CM | POA: Diagnosis not present

## 2021-06-22 DIAGNOSIS — N2581 Secondary hyperparathyroidism of renal origin: Secondary | ICD-10-CM | POA: Diagnosis not present

## 2021-06-22 DIAGNOSIS — T8249XA Other complication of vascular dialysis catheter, initial encounter: Secondary | ICD-10-CM | POA: Diagnosis not present

## 2021-06-22 DIAGNOSIS — D509 Iron deficiency anemia, unspecified: Secondary | ICD-10-CM | POA: Diagnosis not present

## 2021-06-22 DIAGNOSIS — Z992 Dependence on renal dialysis: Secondary | ICD-10-CM | POA: Diagnosis not present

## 2021-06-22 DIAGNOSIS — N186 End stage renal disease: Secondary | ICD-10-CM | POA: Diagnosis not present

## 2021-06-25 DIAGNOSIS — N186 End stage renal disease: Secondary | ICD-10-CM | POA: Diagnosis not present

## 2021-06-25 DIAGNOSIS — D689 Coagulation defect, unspecified: Secondary | ICD-10-CM | POA: Diagnosis not present

## 2021-06-25 DIAGNOSIS — Z992 Dependence on renal dialysis: Secondary | ICD-10-CM | POA: Diagnosis not present

## 2021-06-25 DIAGNOSIS — D509 Iron deficiency anemia, unspecified: Secondary | ICD-10-CM | POA: Diagnosis not present

## 2021-06-25 DIAGNOSIS — E876 Hypokalemia: Secondary | ICD-10-CM | POA: Diagnosis not present

## 2021-06-25 DIAGNOSIS — N2581 Secondary hyperparathyroidism of renal origin: Secondary | ICD-10-CM | POA: Diagnosis not present

## 2021-06-25 DIAGNOSIS — T8249XA Other complication of vascular dialysis catheter, initial encounter: Secondary | ICD-10-CM | POA: Diagnosis not present

## 2021-06-27 DIAGNOSIS — D509 Iron deficiency anemia, unspecified: Secondary | ICD-10-CM | POA: Diagnosis not present

## 2021-06-27 DIAGNOSIS — E876 Hypokalemia: Secondary | ICD-10-CM | POA: Diagnosis not present

## 2021-06-27 DIAGNOSIS — T8249XA Other complication of vascular dialysis catheter, initial encounter: Secondary | ICD-10-CM | POA: Diagnosis not present

## 2021-06-27 DIAGNOSIS — D689 Coagulation defect, unspecified: Secondary | ICD-10-CM | POA: Diagnosis not present

## 2021-06-27 DIAGNOSIS — N186 End stage renal disease: Secondary | ICD-10-CM | POA: Diagnosis not present

## 2021-06-27 DIAGNOSIS — N2581 Secondary hyperparathyroidism of renal origin: Secondary | ICD-10-CM | POA: Diagnosis not present

## 2021-06-27 DIAGNOSIS — Z992 Dependence on renal dialysis: Secondary | ICD-10-CM | POA: Diagnosis not present

## 2021-06-29 DIAGNOSIS — E876 Hypokalemia: Secondary | ICD-10-CM | POA: Diagnosis not present

## 2021-06-29 DIAGNOSIS — D509 Iron deficiency anemia, unspecified: Secondary | ICD-10-CM | POA: Diagnosis not present

## 2021-06-29 DIAGNOSIS — Z992 Dependence on renal dialysis: Secondary | ICD-10-CM | POA: Diagnosis not present

## 2021-06-29 DIAGNOSIS — N186 End stage renal disease: Secondary | ICD-10-CM | POA: Diagnosis not present

## 2021-06-29 DIAGNOSIS — E1129 Type 2 diabetes mellitus with other diabetic kidney complication: Secondary | ICD-10-CM | POA: Diagnosis not present

## 2021-06-29 DIAGNOSIS — T8249XA Other complication of vascular dialysis catheter, initial encounter: Secondary | ICD-10-CM | POA: Diagnosis not present

## 2021-06-29 DIAGNOSIS — D689 Coagulation defect, unspecified: Secondary | ICD-10-CM | POA: Diagnosis not present

## 2021-06-29 DIAGNOSIS — N2581 Secondary hyperparathyroidism of renal origin: Secondary | ICD-10-CM | POA: Diagnosis not present

## 2021-07-02 DIAGNOSIS — D689 Coagulation defect, unspecified: Secondary | ICD-10-CM | POA: Diagnosis not present

## 2021-07-02 DIAGNOSIS — N2581 Secondary hyperparathyroidism of renal origin: Secondary | ICD-10-CM | POA: Diagnosis not present

## 2021-07-02 DIAGNOSIS — E876 Hypokalemia: Secondary | ICD-10-CM | POA: Diagnosis not present

## 2021-07-02 DIAGNOSIS — N186 End stage renal disease: Secondary | ICD-10-CM | POA: Diagnosis not present

## 2021-07-02 DIAGNOSIS — Z23 Encounter for immunization: Secondary | ICD-10-CM | POA: Diagnosis not present

## 2021-07-02 DIAGNOSIS — Z992 Dependence on renal dialysis: Secondary | ICD-10-CM | POA: Diagnosis not present

## 2021-07-02 DIAGNOSIS — D631 Anemia in chronic kidney disease: Secondary | ICD-10-CM | POA: Diagnosis not present

## 2021-07-02 DIAGNOSIS — D509 Iron deficiency anemia, unspecified: Secondary | ICD-10-CM | POA: Diagnosis not present

## 2021-07-04 DIAGNOSIS — N2581 Secondary hyperparathyroidism of renal origin: Secondary | ICD-10-CM | POA: Diagnosis not present

## 2021-07-04 DIAGNOSIS — Z23 Encounter for immunization: Secondary | ICD-10-CM | POA: Diagnosis not present

## 2021-07-04 DIAGNOSIS — D689 Coagulation defect, unspecified: Secondary | ICD-10-CM | POA: Diagnosis not present

## 2021-07-04 DIAGNOSIS — E876 Hypokalemia: Secondary | ICD-10-CM | POA: Diagnosis not present

## 2021-07-04 DIAGNOSIS — N186 End stage renal disease: Secondary | ICD-10-CM | POA: Diagnosis not present

## 2021-07-04 DIAGNOSIS — D509 Iron deficiency anemia, unspecified: Secondary | ICD-10-CM | POA: Diagnosis not present

## 2021-07-04 DIAGNOSIS — D631 Anemia in chronic kidney disease: Secondary | ICD-10-CM | POA: Diagnosis not present

## 2021-07-04 DIAGNOSIS — Z992 Dependence on renal dialysis: Secondary | ICD-10-CM | POA: Diagnosis not present

## 2021-07-06 DIAGNOSIS — Z23 Encounter for immunization: Secondary | ICD-10-CM | POA: Diagnosis not present

## 2021-07-06 DIAGNOSIS — Z992 Dependence on renal dialysis: Secondary | ICD-10-CM | POA: Diagnosis not present

## 2021-07-06 DIAGNOSIS — D689 Coagulation defect, unspecified: Secondary | ICD-10-CM | POA: Diagnosis not present

## 2021-07-06 DIAGNOSIS — D509 Iron deficiency anemia, unspecified: Secondary | ICD-10-CM | POA: Diagnosis not present

## 2021-07-06 DIAGNOSIS — E876 Hypokalemia: Secondary | ICD-10-CM | POA: Diagnosis not present

## 2021-07-06 DIAGNOSIS — N186 End stage renal disease: Secondary | ICD-10-CM | POA: Diagnosis not present

## 2021-07-06 DIAGNOSIS — N2581 Secondary hyperparathyroidism of renal origin: Secondary | ICD-10-CM | POA: Diagnosis not present

## 2021-07-06 DIAGNOSIS — D631 Anemia in chronic kidney disease: Secondary | ICD-10-CM | POA: Diagnosis not present

## 2021-07-09 DIAGNOSIS — D689 Coagulation defect, unspecified: Secondary | ICD-10-CM | POA: Diagnosis not present

## 2021-07-09 DIAGNOSIS — D631 Anemia in chronic kidney disease: Secondary | ICD-10-CM | POA: Diagnosis not present

## 2021-07-09 DIAGNOSIS — N186 End stage renal disease: Secondary | ICD-10-CM | POA: Diagnosis not present

## 2021-07-09 DIAGNOSIS — E876 Hypokalemia: Secondary | ICD-10-CM | POA: Diagnosis not present

## 2021-07-09 DIAGNOSIS — N2581 Secondary hyperparathyroidism of renal origin: Secondary | ICD-10-CM | POA: Diagnosis not present

## 2021-07-09 DIAGNOSIS — Z23 Encounter for immunization: Secondary | ICD-10-CM | POA: Diagnosis not present

## 2021-07-09 DIAGNOSIS — D509 Iron deficiency anemia, unspecified: Secondary | ICD-10-CM | POA: Diagnosis not present

## 2021-07-09 DIAGNOSIS — Z992 Dependence on renal dialysis: Secondary | ICD-10-CM | POA: Diagnosis not present

## 2021-07-11 DIAGNOSIS — N186 End stage renal disease: Secondary | ICD-10-CM | POA: Diagnosis not present

## 2021-07-11 DIAGNOSIS — E876 Hypokalemia: Secondary | ICD-10-CM | POA: Diagnosis not present

## 2021-07-11 DIAGNOSIS — D689 Coagulation defect, unspecified: Secondary | ICD-10-CM | POA: Diagnosis not present

## 2021-07-11 DIAGNOSIS — Z23 Encounter for immunization: Secondary | ICD-10-CM | POA: Diagnosis not present

## 2021-07-11 DIAGNOSIS — D631 Anemia in chronic kidney disease: Secondary | ICD-10-CM | POA: Diagnosis not present

## 2021-07-11 DIAGNOSIS — N2581 Secondary hyperparathyroidism of renal origin: Secondary | ICD-10-CM | POA: Diagnosis not present

## 2021-07-11 DIAGNOSIS — D509 Iron deficiency anemia, unspecified: Secondary | ICD-10-CM | POA: Diagnosis not present

## 2021-07-11 DIAGNOSIS — Z992 Dependence on renal dialysis: Secondary | ICD-10-CM | POA: Diagnosis not present

## 2021-07-13 DIAGNOSIS — E876 Hypokalemia: Secondary | ICD-10-CM | POA: Diagnosis not present

## 2021-07-13 DIAGNOSIS — Z23 Encounter for immunization: Secondary | ICD-10-CM | POA: Diagnosis not present

## 2021-07-13 DIAGNOSIS — D689 Coagulation defect, unspecified: Secondary | ICD-10-CM | POA: Diagnosis not present

## 2021-07-13 DIAGNOSIS — D631 Anemia in chronic kidney disease: Secondary | ICD-10-CM | POA: Diagnosis not present

## 2021-07-13 DIAGNOSIS — D509 Iron deficiency anemia, unspecified: Secondary | ICD-10-CM | POA: Diagnosis not present

## 2021-07-13 DIAGNOSIS — Z992 Dependence on renal dialysis: Secondary | ICD-10-CM | POA: Diagnosis not present

## 2021-07-13 DIAGNOSIS — N2581 Secondary hyperparathyroidism of renal origin: Secondary | ICD-10-CM | POA: Diagnosis not present

## 2021-07-13 DIAGNOSIS — N186 End stage renal disease: Secondary | ICD-10-CM | POA: Diagnosis not present

## 2021-07-16 DIAGNOSIS — N186 End stage renal disease: Secondary | ICD-10-CM | POA: Diagnosis not present

## 2021-07-16 DIAGNOSIS — E876 Hypokalemia: Secondary | ICD-10-CM | POA: Diagnosis not present

## 2021-07-16 DIAGNOSIS — N2581 Secondary hyperparathyroidism of renal origin: Secondary | ICD-10-CM | POA: Diagnosis not present

## 2021-07-16 DIAGNOSIS — Z992 Dependence on renal dialysis: Secondary | ICD-10-CM | POA: Diagnosis not present

## 2021-07-16 DIAGNOSIS — D509 Iron deficiency anemia, unspecified: Secondary | ICD-10-CM | POA: Diagnosis not present

## 2021-07-16 DIAGNOSIS — Z23 Encounter for immunization: Secondary | ICD-10-CM | POA: Diagnosis not present

## 2021-07-16 DIAGNOSIS — D631 Anemia in chronic kidney disease: Secondary | ICD-10-CM | POA: Diagnosis not present

## 2021-07-16 DIAGNOSIS — D689 Coagulation defect, unspecified: Secondary | ICD-10-CM | POA: Diagnosis not present

## 2021-07-18 DIAGNOSIS — E876 Hypokalemia: Secondary | ICD-10-CM | POA: Diagnosis not present

## 2021-07-18 DIAGNOSIS — N186 End stage renal disease: Secondary | ICD-10-CM | POA: Diagnosis not present

## 2021-07-18 DIAGNOSIS — D509 Iron deficiency anemia, unspecified: Secondary | ICD-10-CM | POA: Diagnosis not present

## 2021-07-18 DIAGNOSIS — Z23 Encounter for immunization: Secondary | ICD-10-CM | POA: Diagnosis not present

## 2021-07-18 DIAGNOSIS — Z992 Dependence on renal dialysis: Secondary | ICD-10-CM | POA: Diagnosis not present

## 2021-07-18 DIAGNOSIS — D631 Anemia in chronic kidney disease: Secondary | ICD-10-CM | POA: Diagnosis not present

## 2021-07-18 DIAGNOSIS — D689 Coagulation defect, unspecified: Secondary | ICD-10-CM | POA: Diagnosis not present

## 2021-07-18 DIAGNOSIS — N2581 Secondary hyperparathyroidism of renal origin: Secondary | ICD-10-CM | POA: Diagnosis not present

## 2021-07-20 DIAGNOSIS — N2581 Secondary hyperparathyroidism of renal origin: Secondary | ICD-10-CM | POA: Diagnosis not present

## 2021-07-20 DIAGNOSIS — D689 Coagulation defect, unspecified: Secondary | ICD-10-CM | POA: Diagnosis not present

## 2021-07-20 DIAGNOSIS — E876 Hypokalemia: Secondary | ICD-10-CM | POA: Diagnosis not present

## 2021-07-20 DIAGNOSIS — D631 Anemia in chronic kidney disease: Secondary | ICD-10-CM | POA: Diagnosis not present

## 2021-07-20 DIAGNOSIS — Z23 Encounter for immunization: Secondary | ICD-10-CM | POA: Diagnosis not present

## 2021-07-20 DIAGNOSIS — D509 Iron deficiency anemia, unspecified: Secondary | ICD-10-CM | POA: Diagnosis not present

## 2021-07-20 DIAGNOSIS — Z992 Dependence on renal dialysis: Secondary | ICD-10-CM | POA: Diagnosis not present

## 2021-07-20 DIAGNOSIS — N186 End stage renal disease: Secondary | ICD-10-CM | POA: Diagnosis not present

## 2021-07-23 DIAGNOSIS — E876 Hypokalemia: Secondary | ICD-10-CM | POA: Diagnosis not present

## 2021-07-23 DIAGNOSIS — D631 Anemia in chronic kidney disease: Secondary | ICD-10-CM | POA: Diagnosis not present

## 2021-07-23 DIAGNOSIS — N186 End stage renal disease: Secondary | ICD-10-CM | POA: Diagnosis not present

## 2021-07-23 DIAGNOSIS — Z992 Dependence on renal dialysis: Secondary | ICD-10-CM | POA: Diagnosis not present

## 2021-07-23 DIAGNOSIS — D509 Iron deficiency anemia, unspecified: Secondary | ICD-10-CM | POA: Diagnosis not present

## 2021-07-23 DIAGNOSIS — D689 Coagulation defect, unspecified: Secondary | ICD-10-CM | POA: Diagnosis not present

## 2021-07-23 DIAGNOSIS — Z23 Encounter for immunization: Secondary | ICD-10-CM | POA: Diagnosis not present

## 2021-07-23 DIAGNOSIS — N2581 Secondary hyperparathyroidism of renal origin: Secondary | ICD-10-CM | POA: Diagnosis not present

## 2021-07-25 DIAGNOSIS — N2581 Secondary hyperparathyroidism of renal origin: Secondary | ICD-10-CM | POA: Diagnosis not present

## 2021-07-25 DIAGNOSIS — Z992 Dependence on renal dialysis: Secondary | ICD-10-CM | POA: Diagnosis not present

## 2021-07-25 DIAGNOSIS — N186 End stage renal disease: Secondary | ICD-10-CM | POA: Diagnosis not present

## 2021-07-25 DIAGNOSIS — D631 Anemia in chronic kidney disease: Secondary | ICD-10-CM | POA: Diagnosis not present

## 2021-07-25 DIAGNOSIS — E876 Hypokalemia: Secondary | ICD-10-CM | POA: Diagnosis not present

## 2021-07-25 DIAGNOSIS — D689 Coagulation defect, unspecified: Secondary | ICD-10-CM | POA: Diagnosis not present

## 2021-07-25 DIAGNOSIS — Z23 Encounter for immunization: Secondary | ICD-10-CM | POA: Diagnosis not present

## 2021-07-25 DIAGNOSIS — D509 Iron deficiency anemia, unspecified: Secondary | ICD-10-CM | POA: Diagnosis not present

## 2021-07-27 DIAGNOSIS — D689 Coagulation defect, unspecified: Secondary | ICD-10-CM | POA: Diagnosis not present

## 2021-07-27 DIAGNOSIS — N2581 Secondary hyperparathyroidism of renal origin: Secondary | ICD-10-CM | POA: Diagnosis not present

## 2021-07-27 DIAGNOSIS — E876 Hypokalemia: Secondary | ICD-10-CM | POA: Diagnosis not present

## 2021-07-27 DIAGNOSIS — D509 Iron deficiency anemia, unspecified: Secondary | ICD-10-CM | POA: Diagnosis not present

## 2021-07-27 DIAGNOSIS — Z23 Encounter for immunization: Secondary | ICD-10-CM | POA: Diagnosis not present

## 2021-07-27 DIAGNOSIS — Z992 Dependence on renal dialysis: Secondary | ICD-10-CM | POA: Diagnosis not present

## 2021-07-27 DIAGNOSIS — D631 Anemia in chronic kidney disease: Secondary | ICD-10-CM | POA: Diagnosis not present

## 2021-07-27 DIAGNOSIS — N186 End stage renal disease: Secondary | ICD-10-CM | POA: Diagnosis not present

## 2021-07-30 DIAGNOSIS — N186 End stage renal disease: Secondary | ICD-10-CM | POA: Diagnosis not present

## 2021-07-30 DIAGNOSIS — D509 Iron deficiency anemia, unspecified: Secondary | ICD-10-CM | POA: Diagnosis not present

## 2021-07-30 DIAGNOSIS — D631 Anemia in chronic kidney disease: Secondary | ICD-10-CM | POA: Diagnosis not present

## 2021-07-30 DIAGNOSIS — N2581 Secondary hyperparathyroidism of renal origin: Secondary | ICD-10-CM | POA: Diagnosis not present

## 2021-07-30 DIAGNOSIS — D689 Coagulation defect, unspecified: Secondary | ICD-10-CM | POA: Diagnosis not present

## 2021-07-30 DIAGNOSIS — Z23 Encounter for immunization: Secondary | ICD-10-CM | POA: Diagnosis not present

## 2021-07-30 DIAGNOSIS — E876 Hypokalemia: Secondary | ICD-10-CM | POA: Diagnosis not present

## 2021-07-30 DIAGNOSIS — Z992 Dependence on renal dialysis: Secondary | ICD-10-CM | POA: Diagnosis not present

## 2021-07-30 DIAGNOSIS — E1129 Type 2 diabetes mellitus with other diabetic kidney complication: Secondary | ICD-10-CM | POA: Diagnosis not present

## 2021-08-01 DIAGNOSIS — Z992 Dependence on renal dialysis: Secondary | ICD-10-CM | POA: Diagnosis not present

## 2021-08-01 DIAGNOSIS — D689 Coagulation defect, unspecified: Secondary | ICD-10-CM | POA: Diagnosis not present

## 2021-08-01 DIAGNOSIS — N2581 Secondary hyperparathyroidism of renal origin: Secondary | ICD-10-CM | POA: Diagnosis not present

## 2021-08-01 DIAGNOSIS — N186 End stage renal disease: Secondary | ICD-10-CM | POA: Diagnosis not present

## 2021-08-01 DIAGNOSIS — E876 Hypokalemia: Secondary | ICD-10-CM | POA: Diagnosis not present

## 2021-08-03 DIAGNOSIS — N2581 Secondary hyperparathyroidism of renal origin: Secondary | ICD-10-CM | POA: Diagnosis not present

## 2021-08-03 DIAGNOSIS — Z992 Dependence on renal dialysis: Secondary | ICD-10-CM | POA: Diagnosis not present

## 2021-08-03 DIAGNOSIS — N186 End stage renal disease: Secondary | ICD-10-CM | POA: Diagnosis not present

## 2021-08-03 DIAGNOSIS — E876 Hypokalemia: Secondary | ICD-10-CM | POA: Diagnosis not present

## 2021-08-03 DIAGNOSIS — D689 Coagulation defect, unspecified: Secondary | ICD-10-CM | POA: Diagnosis not present

## 2021-08-06 DIAGNOSIS — N2581 Secondary hyperparathyroidism of renal origin: Secondary | ICD-10-CM | POA: Diagnosis not present

## 2021-08-06 DIAGNOSIS — E876 Hypokalemia: Secondary | ICD-10-CM | POA: Diagnosis not present

## 2021-08-06 DIAGNOSIS — Z992 Dependence on renal dialysis: Secondary | ICD-10-CM | POA: Diagnosis not present

## 2021-08-06 DIAGNOSIS — N186 End stage renal disease: Secondary | ICD-10-CM | POA: Diagnosis not present

## 2021-08-06 DIAGNOSIS — D689 Coagulation defect, unspecified: Secondary | ICD-10-CM | POA: Diagnosis not present

## 2021-08-08 DIAGNOSIS — Z992 Dependence on renal dialysis: Secondary | ICD-10-CM | POA: Diagnosis not present

## 2021-08-08 DIAGNOSIS — N186 End stage renal disease: Secondary | ICD-10-CM | POA: Diagnosis not present

## 2021-08-08 DIAGNOSIS — D689 Coagulation defect, unspecified: Secondary | ICD-10-CM | POA: Diagnosis not present

## 2021-08-08 DIAGNOSIS — E876 Hypokalemia: Secondary | ICD-10-CM | POA: Diagnosis not present

## 2021-08-08 DIAGNOSIS — N2581 Secondary hyperparathyroidism of renal origin: Secondary | ICD-10-CM | POA: Diagnosis not present

## 2021-08-10 DIAGNOSIS — N2581 Secondary hyperparathyroidism of renal origin: Secondary | ICD-10-CM | POA: Diagnosis not present

## 2021-08-10 DIAGNOSIS — Z992 Dependence on renal dialysis: Secondary | ICD-10-CM | POA: Diagnosis not present

## 2021-08-10 DIAGNOSIS — N186 End stage renal disease: Secondary | ICD-10-CM | POA: Diagnosis not present

## 2021-08-10 DIAGNOSIS — D689 Coagulation defect, unspecified: Secondary | ICD-10-CM | POA: Diagnosis not present

## 2021-08-10 DIAGNOSIS — E876 Hypokalemia: Secondary | ICD-10-CM | POA: Diagnosis not present

## 2021-08-13 DIAGNOSIS — N2581 Secondary hyperparathyroidism of renal origin: Secondary | ICD-10-CM | POA: Diagnosis not present

## 2021-08-13 DIAGNOSIS — Z992 Dependence on renal dialysis: Secondary | ICD-10-CM | POA: Diagnosis not present

## 2021-08-13 DIAGNOSIS — D689 Coagulation defect, unspecified: Secondary | ICD-10-CM | POA: Diagnosis not present

## 2021-08-13 DIAGNOSIS — N186 End stage renal disease: Secondary | ICD-10-CM | POA: Diagnosis not present

## 2021-08-13 DIAGNOSIS — E876 Hypokalemia: Secondary | ICD-10-CM | POA: Diagnosis not present

## 2021-08-15 DIAGNOSIS — E876 Hypokalemia: Secondary | ICD-10-CM | POA: Diagnosis not present

## 2021-08-15 DIAGNOSIS — N2581 Secondary hyperparathyroidism of renal origin: Secondary | ICD-10-CM | POA: Diagnosis not present

## 2021-08-15 DIAGNOSIS — N186 End stage renal disease: Secondary | ICD-10-CM | POA: Diagnosis not present

## 2021-08-15 DIAGNOSIS — Z992 Dependence on renal dialysis: Secondary | ICD-10-CM | POA: Diagnosis not present

## 2021-08-15 DIAGNOSIS — D689 Coagulation defect, unspecified: Secondary | ICD-10-CM | POA: Diagnosis not present

## 2021-08-17 DIAGNOSIS — Z992 Dependence on renal dialysis: Secondary | ICD-10-CM | POA: Diagnosis not present

## 2021-08-17 DIAGNOSIS — D689 Coagulation defect, unspecified: Secondary | ICD-10-CM | POA: Diagnosis not present

## 2021-08-17 DIAGNOSIS — E876 Hypokalemia: Secondary | ICD-10-CM | POA: Diagnosis not present

## 2021-08-17 DIAGNOSIS — N186 End stage renal disease: Secondary | ICD-10-CM | POA: Diagnosis not present

## 2021-08-17 DIAGNOSIS — N2581 Secondary hyperparathyroidism of renal origin: Secondary | ICD-10-CM | POA: Diagnosis not present

## 2021-08-20 DIAGNOSIS — N186 End stage renal disease: Secondary | ICD-10-CM | POA: Diagnosis not present

## 2021-08-20 DIAGNOSIS — D689 Coagulation defect, unspecified: Secondary | ICD-10-CM | POA: Diagnosis not present

## 2021-08-20 DIAGNOSIS — N2581 Secondary hyperparathyroidism of renal origin: Secondary | ICD-10-CM | POA: Diagnosis not present

## 2021-08-20 DIAGNOSIS — E876 Hypokalemia: Secondary | ICD-10-CM | POA: Diagnosis not present

## 2021-08-20 DIAGNOSIS — Z992 Dependence on renal dialysis: Secondary | ICD-10-CM | POA: Diagnosis not present

## 2021-08-22 DIAGNOSIS — D689 Coagulation defect, unspecified: Secondary | ICD-10-CM | POA: Diagnosis not present

## 2021-08-22 DIAGNOSIS — N2581 Secondary hyperparathyroidism of renal origin: Secondary | ICD-10-CM | POA: Diagnosis not present

## 2021-08-22 DIAGNOSIS — Z992 Dependence on renal dialysis: Secondary | ICD-10-CM | POA: Diagnosis not present

## 2021-08-22 DIAGNOSIS — E876 Hypokalemia: Secondary | ICD-10-CM | POA: Diagnosis not present

## 2021-08-22 DIAGNOSIS — N186 End stage renal disease: Secondary | ICD-10-CM | POA: Diagnosis not present

## 2021-08-25 DIAGNOSIS — D689 Coagulation defect, unspecified: Secondary | ICD-10-CM | POA: Diagnosis not present

## 2021-08-25 DIAGNOSIS — E876 Hypokalemia: Secondary | ICD-10-CM | POA: Diagnosis not present

## 2021-08-25 DIAGNOSIS — N186 End stage renal disease: Secondary | ICD-10-CM | POA: Diagnosis not present

## 2021-08-25 DIAGNOSIS — Z992 Dependence on renal dialysis: Secondary | ICD-10-CM | POA: Diagnosis not present

## 2021-08-25 DIAGNOSIS — N2581 Secondary hyperparathyroidism of renal origin: Secondary | ICD-10-CM | POA: Diagnosis not present

## 2021-08-27 DIAGNOSIS — D689 Coagulation defect, unspecified: Secondary | ICD-10-CM | POA: Diagnosis not present

## 2021-08-27 DIAGNOSIS — N2581 Secondary hyperparathyroidism of renal origin: Secondary | ICD-10-CM | POA: Diagnosis not present

## 2021-08-27 DIAGNOSIS — E876 Hypokalemia: Secondary | ICD-10-CM | POA: Diagnosis not present

## 2021-08-27 DIAGNOSIS — N186 End stage renal disease: Secondary | ICD-10-CM | POA: Diagnosis not present

## 2021-08-27 DIAGNOSIS — Z992 Dependence on renal dialysis: Secondary | ICD-10-CM | POA: Diagnosis not present

## 2021-08-29 DIAGNOSIS — D689 Coagulation defect, unspecified: Secondary | ICD-10-CM | POA: Diagnosis not present

## 2021-08-29 DIAGNOSIS — N2581 Secondary hyperparathyroidism of renal origin: Secondary | ICD-10-CM | POA: Diagnosis not present

## 2021-08-29 DIAGNOSIS — Z992 Dependence on renal dialysis: Secondary | ICD-10-CM | POA: Diagnosis not present

## 2021-08-29 DIAGNOSIS — E1129 Type 2 diabetes mellitus with other diabetic kidney complication: Secondary | ICD-10-CM | POA: Diagnosis not present

## 2021-08-29 DIAGNOSIS — E876 Hypokalemia: Secondary | ICD-10-CM | POA: Diagnosis not present

## 2021-08-29 DIAGNOSIS — N186 End stage renal disease: Secondary | ICD-10-CM | POA: Diagnosis not present

## 2021-08-31 DIAGNOSIS — N186 End stage renal disease: Secondary | ICD-10-CM | POA: Diagnosis not present

## 2021-08-31 DIAGNOSIS — N2581 Secondary hyperparathyroidism of renal origin: Secondary | ICD-10-CM | POA: Diagnosis not present

## 2021-08-31 DIAGNOSIS — E876 Hypokalemia: Secondary | ICD-10-CM | POA: Diagnosis not present

## 2021-08-31 DIAGNOSIS — D689 Coagulation defect, unspecified: Secondary | ICD-10-CM | POA: Diagnosis not present

## 2021-08-31 DIAGNOSIS — Z992 Dependence on renal dialysis: Secondary | ICD-10-CM | POA: Diagnosis not present

## 2021-09-03 DIAGNOSIS — N186 End stage renal disease: Secondary | ICD-10-CM | POA: Diagnosis not present

## 2021-09-03 DIAGNOSIS — E876 Hypokalemia: Secondary | ICD-10-CM | POA: Diagnosis not present

## 2021-09-03 DIAGNOSIS — N2581 Secondary hyperparathyroidism of renal origin: Secondary | ICD-10-CM | POA: Diagnosis not present

## 2021-09-03 DIAGNOSIS — D689 Coagulation defect, unspecified: Secondary | ICD-10-CM | POA: Diagnosis not present

## 2021-09-03 DIAGNOSIS — Z992 Dependence on renal dialysis: Secondary | ICD-10-CM | POA: Diagnosis not present

## 2021-09-05 DIAGNOSIS — N186 End stage renal disease: Secondary | ICD-10-CM | POA: Diagnosis not present

## 2021-09-05 DIAGNOSIS — N2581 Secondary hyperparathyroidism of renal origin: Secondary | ICD-10-CM | POA: Diagnosis not present

## 2021-09-05 DIAGNOSIS — Z992 Dependence on renal dialysis: Secondary | ICD-10-CM | POA: Diagnosis not present

## 2021-09-05 DIAGNOSIS — E876 Hypokalemia: Secondary | ICD-10-CM | POA: Diagnosis not present

## 2021-09-05 DIAGNOSIS — D689 Coagulation defect, unspecified: Secondary | ICD-10-CM | POA: Diagnosis not present

## 2021-09-07 DIAGNOSIS — N2581 Secondary hyperparathyroidism of renal origin: Secondary | ICD-10-CM | POA: Diagnosis not present

## 2021-09-07 DIAGNOSIS — D689 Coagulation defect, unspecified: Secondary | ICD-10-CM | POA: Diagnosis not present

## 2021-09-07 DIAGNOSIS — N186 End stage renal disease: Secondary | ICD-10-CM | POA: Diagnosis not present

## 2021-09-07 DIAGNOSIS — E876 Hypokalemia: Secondary | ICD-10-CM | POA: Diagnosis not present

## 2021-09-07 DIAGNOSIS — Z992 Dependence on renal dialysis: Secondary | ICD-10-CM | POA: Diagnosis not present

## 2021-09-10 DIAGNOSIS — N186 End stage renal disease: Secondary | ICD-10-CM | POA: Diagnosis not present

## 2021-09-10 DIAGNOSIS — D689 Coagulation defect, unspecified: Secondary | ICD-10-CM | POA: Diagnosis not present

## 2021-09-10 DIAGNOSIS — N2581 Secondary hyperparathyroidism of renal origin: Secondary | ICD-10-CM | POA: Diagnosis not present

## 2021-09-10 DIAGNOSIS — E876 Hypokalemia: Secondary | ICD-10-CM | POA: Diagnosis not present

## 2021-09-10 DIAGNOSIS — Z992 Dependence on renal dialysis: Secondary | ICD-10-CM | POA: Diagnosis not present

## 2021-09-11 DIAGNOSIS — D689 Coagulation defect, unspecified: Secondary | ICD-10-CM | POA: Diagnosis not present

## 2021-09-11 DIAGNOSIS — I771 Stricture of artery: Secondary | ICD-10-CM | POA: Diagnosis not present

## 2021-09-11 DIAGNOSIS — N2581 Secondary hyperparathyroidism of renal origin: Secondary | ICD-10-CM | POA: Diagnosis not present

## 2021-09-11 DIAGNOSIS — E876 Hypokalemia: Secondary | ICD-10-CM | POA: Diagnosis not present

## 2021-09-11 DIAGNOSIS — N186 End stage renal disease: Secondary | ICD-10-CM | POA: Diagnosis not present

## 2021-09-11 DIAGNOSIS — Z992 Dependence on renal dialysis: Secondary | ICD-10-CM | POA: Diagnosis not present

## 2021-09-11 DIAGNOSIS — T82868A Thrombosis of vascular prosthetic devices, implants and grafts, initial encounter: Secondary | ICD-10-CM | POA: Diagnosis not present

## 2021-09-12 DIAGNOSIS — Z992 Dependence on renal dialysis: Secondary | ICD-10-CM | POA: Diagnosis not present

## 2021-09-12 DIAGNOSIS — D689 Coagulation defect, unspecified: Secondary | ICD-10-CM | POA: Diagnosis not present

## 2021-09-12 DIAGNOSIS — N186 End stage renal disease: Secondary | ICD-10-CM | POA: Diagnosis not present

## 2021-09-12 DIAGNOSIS — N2581 Secondary hyperparathyroidism of renal origin: Secondary | ICD-10-CM | POA: Diagnosis not present

## 2021-09-12 DIAGNOSIS — E876 Hypokalemia: Secondary | ICD-10-CM | POA: Diagnosis not present

## 2021-09-14 DIAGNOSIS — E876 Hypokalemia: Secondary | ICD-10-CM | POA: Diagnosis not present

## 2021-09-14 DIAGNOSIS — N2581 Secondary hyperparathyroidism of renal origin: Secondary | ICD-10-CM | POA: Diagnosis not present

## 2021-09-14 DIAGNOSIS — N186 End stage renal disease: Secondary | ICD-10-CM | POA: Diagnosis not present

## 2021-09-14 DIAGNOSIS — D689 Coagulation defect, unspecified: Secondary | ICD-10-CM | POA: Diagnosis not present

## 2021-09-14 DIAGNOSIS — Z992 Dependence on renal dialysis: Secondary | ICD-10-CM | POA: Diagnosis not present

## 2021-09-17 DIAGNOSIS — N2581 Secondary hyperparathyroidism of renal origin: Secondary | ICD-10-CM | POA: Diagnosis not present

## 2021-09-17 DIAGNOSIS — Z992 Dependence on renal dialysis: Secondary | ICD-10-CM | POA: Diagnosis not present

## 2021-09-17 DIAGNOSIS — E876 Hypokalemia: Secondary | ICD-10-CM | POA: Diagnosis not present

## 2021-09-17 DIAGNOSIS — N186 End stage renal disease: Secondary | ICD-10-CM | POA: Diagnosis not present

## 2021-09-17 DIAGNOSIS — D689 Coagulation defect, unspecified: Secondary | ICD-10-CM | POA: Diagnosis not present

## 2021-09-19 DIAGNOSIS — N2581 Secondary hyperparathyroidism of renal origin: Secondary | ICD-10-CM | POA: Diagnosis not present

## 2021-09-19 DIAGNOSIS — N186 End stage renal disease: Secondary | ICD-10-CM | POA: Diagnosis not present

## 2021-09-19 DIAGNOSIS — D689 Coagulation defect, unspecified: Secondary | ICD-10-CM | POA: Diagnosis not present

## 2021-09-19 DIAGNOSIS — E876 Hypokalemia: Secondary | ICD-10-CM | POA: Diagnosis not present

## 2021-09-19 DIAGNOSIS — Z992 Dependence on renal dialysis: Secondary | ICD-10-CM | POA: Diagnosis not present

## 2021-09-21 DIAGNOSIS — Z992 Dependence on renal dialysis: Secondary | ICD-10-CM | POA: Diagnosis not present

## 2021-09-21 DIAGNOSIS — E876 Hypokalemia: Secondary | ICD-10-CM | POA: Diagnosis not present

## 2021-09-21 DIAGNOSIS — D689 Coagulation defect, unspecified: Secondary | ICD-10-CM | POA: Diagnosis not present

## 2021-09-21 DIAGNOSIS — N186 End stage renal disease: Secondary | ICD-10-CM | POA: Diagnosis not present

## 2021-09-21 DIAGNOSIS — N2581 Secondary hyperparathyroidism of renal origin: Secondary | ICD-10-CM | POA: Diagnosis not present

## 2021-09-24 DIAGNOSIS — N186 End stage renal disease: Secondary | ICD-10-CM | POA: Diagnosis not present

## 2021-09-24 DIAGNOSIS — N2581 Secondary hyperparathyroidism of renal origin: Secondary | ICD-10-CM | POA: Diagnosis not present

## 2021-09-24 DIAGNOSIS — E876 Hypokalemia: Secondary | ICD-10-CM | POA: Diagnosis not present

## 2021-09-24 DIAGNOSIS — Z992 Dependence on renal dialysis: Secondary | ICD-10-CM | POA: Diagnosis not present

## 2021-09-24 DIAGNOSIS — D689 Coagulation defect, unspecified: Secondary | ICD-10-CM | POA: Diagnosis not present

## 2021-09-26 DIAGNOSIS — N2581 Secondary hyperparathyroidism of renal origin: Secondary | ICD-10-CM | POA: Diagnosis not present

## 2021-09-26 DIAGNOSIS — N186 End stage renal disease: Secondary | ICD-10-CM | POA: Diagnosis not present

## 2021-09-26 DIAGNOSIS — D689 Coagulation defect, unspecified: Secondary | ICD-10-CM | POA: Diagnosis not present

## 2021-09-26 DIAGNOSIS — E876 Hypokalemia: Secondary | ICD-10-CM | POA: Diagnosis not present

## 2021-09-26 DIAGNOSIS — Z992 Dependence on renal dialysis: Secondary | ICD-10-CM | POA: Diagnosis not present

## 2021-09-28 DIAGNOSIS — N186 End stage renal disease: Secondary | ICD-10-CM | POA: Diagnosis not present

## 2021-09-28 DIAGNOSIS — Z992 Dependence on renal dialysis: Secondary | ICD-10-CM | POA: Diagnosis not present

## 2021-09-28 DIAGNOSIS — E876 Hypokalemia: Secondary | ICD-10-CM | POA: Diagnosis not present

## 2021-09-28 DIAGNOSIS — D689 Coagulation defect, unspecified: Secondary | ICD-10-CM | POA: Diagnosis not present

## 2021-09-28 DIAGNOSIS — N2581 Secondary hyperparathyroidism of renal origin: Secondary | ICD-10-CM | POA: Diagnosis not present

## 2021-09-29 DIAGNOSIS — E1129 Type 2 diabetes mellitus with other diabetic kidney complication: Secondary | ICD-10-CM | POA: Diagnosis not present

## 2021-09-29 DIAGNOSIS — Z992 Dependence on renal dialysis: Secondary | ICD-10-CM | POA: Diagnosis not present

## 2021-09-29 DIAGNOSIS — N186 End stage renal disease: Secondary | ICD-10-CM | POA: Diagnosis not present

## 2021-10-01 DIAGNOSIS — E876 Hypokalemia: Secondary | ICD-10-CM | POA: Diagnosis not present

## 2021-10-01 DIAGNOSIS — N186 End stage renal disease: Secondary | ICD-10-CM | POA: Diagnosis not present

## 2021-10-01 DIAGNOSIS — N2581 Secondary hyperparathyroidism of renal origin: Secondary | ICD-10-CM | POA: Diagnosis not present

## 2021-10-01 DIAGNOSIS — D689 Coagulation defect, unspecified: Secondary | ICD-10-CM | POA: Diagnosis not present

## 2021-10-01 DIAGNOSIS — Z992 Dependence on renal dialysis: Secondary | ICD-10-CM | POA: Diagnosis not present

## 2021-10-03 DIAGNOSIS — Z992 Dependence on renal dialysis: Secondary | ICD-10-CM | POA: Diagnosis not present

## 2021-10-03 DIAGNOSIS — N186 End stage renal disease: Secondary | ICD-10-CM | POA: Diagnosis not present

## 2021-10-03 DIAGNOSIS — D689 Coagulation defect, unspecified: Secondary | ICD-10-CM | POA: Diagnosis not present

## 2021-10-03 DIAGNOSIS — N2581 Secondary hyperparathyroidism of renal origin: Secondary | ICD-10-CM | POA: Diagnosis not present

## 2021-10-03 DIAGNOSIS — E876 Hypokalemia: Secondary | ICD-10-CM | POA: Diagnosis not present

## 2021-10-05 DIAGNOSIS — N186 End stage renal disease: Secondary | ICD-10-CM | POA: Diagnosis not present

## 2021-10-05 DIAGNOSIS — E876 Hypokalemia: Secondary | ICD-10-CM | POA: Diagnosis not present

## 2021-10-05 DIAGNOSIS — D689 Coagulation defect, unspecified: Secondary | ICD-10-CM | POA: Diagnosis not present

## 2021-10-05 DIAGNOSIS — N2581 Secondary hyperparathyroidism of renal origin: Secondary | ICD-10-CM | POA: Diagnosis not present

## 2021-10-05 DIAGNOSIS — Z992 Dependence on renal dialysis: Secondary | ICD-10-CM | POA: Diagnosis not present

## 2021-10-08 DIAGNOSIS — E876 Hypokalemia: Secondary | ICD-10-CM | POA: Diagnosis not present

## 2021-10-08 DIAGNOSIS — Z992 Dependence on renal dialysis: Secondary | ICD-10-CM | POA: Diagnosis not present

## 2021-10-08 DIAGNOSIS — N2581 Secondary hyperparathyroidism of renal origin: Secondary | ICD-10-CM | POA: Diagnosis not present

## 2021-10-08 DIAGNOSIS — D689 Coagulation defect, unspecified: Secondary | ICD-10-CM | POA: Diagnosis not present

## 2021-10-08 DIAGNOSIS — N186 End stage renal disease: Secondary | ICD-10-CM | POA: Diagnosis not present

## 2021-10-10 DIAGNOSIS — N186 End stage renal disease: Secondary | ICD-10-CM | POA: Diagnosis not present

## 2021-10-10 DIAGNOSIS — Z992 Dependence on renal dialysis: Secondary | ICD-10-CM | POA: Diagnosis not present

## 2021-10-10 DIAGNOSIS — N2581 Secondary hyperparathyroidism of renal origin: Secondary | ICD-10-CM | POA: Diagnosis not present

## 2021-10-10 DIAGNOSIS — E876 Hypokalemia: Secondary | ICD-10-CM | POA: Diagnosis not present

## 2021-10-10 DIAGNOSIS — D689 Coagulation defect, unspecified: Secondary | ICD-10-CM | POA: Diagnosis not present

## 2021-10-12 DIAGNOSIS — E876 Hypokalemia: Secondary | ICD-10-CM | POA: Diagnosis not present

## 2021-10-12 DIAGNOSIS — Z992 Dependence on renal dialysis: Secondary | ICD-10-CM | POA: Diagnosis not present

## 2021-10-12 DIAGNOSIS — D689 Coagulation defect, unspecified: Secondary | ICD-10-CM | POA: Diagnosis not present

## 2021-10-12 DIAGNOSIS — N2581 Secondary hyperparathyroidism of renal origin: Secondary | ICD-10-CM | POA: Diagnosis not present

## 2021-10-12 DIAGNOSIS — N186 End stage renal disease: Secondary | ICD-10-CM | POA: Diagnosis not present

## 2021-10-15 DIAGNOSIS — E876 Hypokalemia: Secondary | ICD-10-CM | POA: Diagnosis not present

## 2021-10-15 DIAGNOSIS — D689 Coagulation defect, unspecified: Secondary | ICD-10-CM | POA: Diagnosis not present

## 2021-10-15 DIAGNOSIS — Z992 Dependence on renal dialysis: Secondary | ICD-10-CM | POA: Diagnosis not present

## 2021-10-15 DIAGNOSIS — N2581 Secondary hyperparathyroidism of renal origin: Secondary | ICD-10-CM | POA: Diagnosis not present

## 2021-10-15 DIAGNOSIS — N186 End stage renal disease: Secondary | ICD-10-CM | POA: Diagnosis not present

## 2021-10-17 DIAGNOSIS — E876 Hypokalemia: Secondary | ICD-10-CM | POA: Diagnosis not present

## 2021-10-17 DIAGNOSIS — D689 Coagulation defect, unspecified: Secondary | ICD-10-CM | POA: Diagnosis not present

## 2021-10-17 DIAGNOSIS — Z992 Dependence on renal dialysis: Secondary | ICD-10-CM | POA: Diagnosis not present

## 2021-10-17 DIAGNOSIS — N2581 Secondary hyperparathyroidism of renal origin: Secondary | ICD-10-CM | POA: Diagnosis not present

## 2021-10-17 DIAGNOSIS — N186 End stage renal disease: Secondary | ICD-10-CM | POA: Diagnosis not present

## 2021-10-19 DIAGNOSIS — N2581 Secondary hyperparathyroidism of renal origin: Secondary | ICD-10-CM | POA: Diagnosis not present

## 2021-10-19 DIAGNOSIS — Z992 Dependence on renal dialysis: Secondary | ICD-10-CM | POA: Diagnosis not present

## 2021-10-19 DIAGNOSIS — D689 Coagulation defect, unspecified: Secondary | ICD-10-CM | POA: Diagnosis not present

## 2021-10-19 DIAGNOSIS — N186 End stage renal disease: Secondary | ICD-10-CM | POA: Diagnosis not present

## 2021-10-19 DIAGNOSIS — E876 Hypokalemia: Secondary | ICD-10-CM | POA: Diagnosis not present

## 2021-10-22 DIAGNOSIS — Z992 Dependence on renal dialysis: Secondary | ICD-10-CM | POA: Diagnosis not present

## 2021-10-22 DIAGNOSIS — N2581 Secondary hyperparathyroidism of renal origin: Secondary | ICD-10-CM | POA: Diagnosis not present

## 2021-10-22 DIAGNOSIS — E876 Hypokalemia: Secondary | ICD-10-CM | POA: Diagnosis not present

## 2021-10-22 DIAGNOSIS — N186 End stage renal disease: Secondary | ICD-10-CM | POA: Diagnosis not present

## 2021-10-22 DIAGNOSIS — D689 Coagulation defect, unspecified: Secondary | ICD-10-CM | POA: Diagnosis not present

## 2021-10-24 DIAGNOSIS — D689 Coagulation defect, unspecified: Secondary | ICD-10-CM | POA: Diagnosis not present

## 2021-10-24 DIAGNOSIS — Z992 Dependence on renal dialysis: Secondary | ICD-10-CM | POA: Diagnosis not present

## 2021-10-24 DIAGNOSIS — N186 End stage renal disease: Secondary | ICD-10-CM | POA: Diagnosis not present

## 2021-10-24 DIAGNOSIS — N2581 Secondary hyperparathyroidism of renal origin: Secondary | ICD-10-CM | POA: Diagnosis not present

## 2021-10-24 DIAGNOSIS — E876 Hypokalemia: Secondary | ICD-10-CM | POA: Diagnosis not present

## 2021-10-26 DIAGNOSIS — D689 Coagulation defect, unspecified: Secondary | ICD-10-CM | POA: Diagnosis not present

## 2021-10-26 DIAGNOSIS — Z992 Dependence on renal dialysis: Secondary | ICD-10-CM | POA: Diagnosis not present

## 2021-10-26 DIAGNOSIS — N186 End stage renal disease: Secondary | ICD-10-CM | POA: Diagnosis not present

## 2021-10-26 DIAGNOSIS — E876 Hypokalemia: Secondary | ICD-10-CM | POA: Diagnosis not present

## 2021-10-26 DIAGNOSIS — N2581 Secondary hyperparathyroidism of renal origin: Secondary | ICD-10-CM | POA: Diagnosis not present

## 2021-10-29 DIAGNOSIS — Z992 Dependence on renal dialysis: Secondary | ICD-10-CM | POA: Diagnosis not present

## 2021-10-29 DIAGNOSIS — E876 Hypokalemia: Secondary | ICD-10-CM | POA: Diagnosis not present

## 2021-10-29 DIAGNOSIS — N2581 Secondary hyperparathyroidism of renal origin: Secondary | ICD-10-CM | POA: Diagnosis not present

## 2021-10-29 DIAGNOSIS — N186 End stage renal disease: Secondary | ICD-10-CM | POA: Diagnosis not present

## 2021-10-29 DIAGNOSIS — D689 Coagulation defect, unspecified: Secondary | ICD-10-CM | POA: Diagnosis not present

## 2021-10-30 DIAGNOSIS — Z992 Dependence on renal dialysis: Secondary | ICD-10-CM | POA: Diagnosis not present

## 2021-10-30 DIAGNOSIS — E1129 Type 2 diabetes mellitus with other diabetic kidney complication: Secondary | ICD-10-CM | POA: Diagnosis not present

## 2021-10-30 DIAGNOSIS — N186 End stage renal disease: Secondary | ICD-10-CM | POA: Diagnosis not present

## 2021-10-31 DIAGNOSIS — N2581 Secondary hyperparathyroidism of renal origin: Secondary | ICD-10-CM | POA: Diagnosis not present

## 2021-10-31 DIAGNOSIS — D689 Coagulation defect, unspecified: Secondary | ICD-10-CM | POA: Diagnosis not present

## 2021-10-31 DIAGNOSIS — E876 Hypokalemia: Secondary | ICD-10-CM | POA: Diagnosis not present

## 2021-10-31 DIAGNOSIS — N186 End stage renal disease: Secondary | ICD-10-CM | POA: Diagnosis not present

## 2021-10-31 DIAGNOSIS — Z992 Dependence on renal dialysis: Secondary | ICD-10-CM | POA: Diagnosis not present

## 2021-11-02 DIAGNOSIS — Z992 Dependence on renal dialysis: Secondary | ICD-10-CM | POA: Diagnosis not present

## 2021-11-02 DIAGNOSIS — N186 End stage renal disease: Secondary | ICD-10-CM | POA: Diagnosis not present

## 2021-11-02 DIAGNOSIS — E876 Hypokalemia: Secondary | ICD-10-CM | POA: Diagnosis not present

## 2021-11-02 DIAGNOSIS — D689 Coagulation defect, unspecified: Secondary | ICD-10-CM | POA: Diagnosis not present

## 2021-11-02 DIAGNOSIS — N2581 Secondary hyperparathyroidism of renal origin: Secondary | ICD-10-CM | POA: Diagnosis not present

## 2021-11-05 DIAGNOSIS — N186 End stage renal disease: Secondary | ICD-10-CM | POA: Diagnosis not present

## 2021-11-05 DIAGNOSIS — N2581 Secondary hyperparathyroidism of renal origin: Secondary | ICD-10-CM | POA: Diagnosis not present

## 2021-11-05 DIAGNOSIS — E876 Hypokalemia: Secondary | ICD-10-CM | POA: Diagnosis not present

## 2021-11-05 DIAGNOSIS — D689 Coagulation defect, unspecified: Secondary | ICD-10-CM | POA: Diagnosis not present

## 2021-11-05 DIAGNOSIS — Z992 Dependence on renal dialysis: Secondary | ICD-10-CM | POA: Diagnosis not present

## 2021-11-07 DIAGNOSIS — N2581 Secondary hyperparathyroidism of renal origin: Secondary | ICD-10-CM | POA: Diagnosis not present

## 2021-11-07 DIAGNOSIS — Z992 Dependence on renal dialysis: Secondary | ICD-10-CM | POA: Diagnosis not present

## 2021-11-07 DIAGNOSIS — D689 Coagulation defect, unspecified: Secondary | ICD-10-CM | POA: Diagnosis not present

## 2021-11-07 DIAGNOSIS — N186 End stage renal disease: Secondary | ICD-10-CM | POA: Diagnosis not present

## 2021-11-07 DIAGNOSIS — E876 Hypokalemia: Secondary | ICD-10-CM | POA: Diagnosis not present

## 2021-11-09 DIAGNOSIS — Z992 Dependence on renal dialysis: Secondary | ICD-10-CM | POA: Diagnosis not present

## 2021-11-09 DIAGNOSIS — N2581 Secondary hyperparathyroidism of renal origin: Secondary | ICD-10-CM | POA: Diagnosis not present

## 2021-11-09 DIAGNOSIS — E876 Hypokalemia: Secondary | ICD-10-CM | POA: Diagnosis not present

## 2021-11-09 DIAGNOSIS — N186 End stage renal disease: Secondary | ICD-10-CM | POA: Diagnosis not present

## 2021-11-09 DIAGNOSIS — D689 Coagulation defect, unspecified: Secondary | ICD-10-CM | POA: Diagnosis not present

## 2021-11-12 DIAGNOSIS — N2581 Secondary hyperparathyroidism of renal origin: Secondary | ICD-10-CM | POA: Diagnosis not present

## 2021-11-12 DIAGNOSIS — Z992 Dependence on renal dialysis: Secondary | ICD-10-CM | POA: Diagnosis not present

## 2021-11-12 DIAGNOSIS — E876 Hypokalemia: Secondary | ICD-10-CM | POA: Diagnosis not present

## 2021-11-12 DIAGNOSIS — N186 End stage renal disease: Secondary | ICD-10-CM | POA: Diagnosis not present

## 2021-11-12 DIAGNOSIS — D689 Coagulation defect, unspecified: Secondary | ICD-10-CM | POA: Diagnosis not present

## 2021-11-14 DIAGNOSIS — N186 End stage renal disease: Secondary | ICD-10-CM | POA: Diagnosis not present

## 2021-11-14 DIAGNOSIS — Z992 Dependence on renal dialysis: Secondary | ICD-10-CM | POA: Diagnosis not present

## 2021-11-14 DIAGNOSIS — N2581 Secondary hyperparathyroidism of renal origin: Secondary | ICD-10-CM | POA: Diagnosis not present

## 2021-11-14 DIAGNOSIS — E876 Hypokalemia: Secondary | ICD-10-CM | POA: Diagnosis not present

## 2021-11-14 DIAGNOSIS — D689 Coagulation defect, unspecified: Secondary | ICD-10-CM | POA: Diagnosis not present

## 2021-11-16 DIAGNOSIS — N2581 Secondary hyperparathyroidism of renal origin: Secondary | ICD-10-CM | POA: Diagnosis not present

## 2021-11-16 DIAGNOSIS — Z992 Dependence on renal dialysis: Secondary | ICD-10-CM | POA: Diagnosis not present

## 2021-11-16 DIAGNOSIS — E876 Hypokalemia: Secondary | ICD-10-CM | POA: Diagnosis not present

## 2021-11-16 DIAGNOSIS — D689 Coagulation defect, unspecified: Secondary | ICD-10-CM | POA: Diagnosis not present

## 2021-11-16 DIAGNOSIS — N186 End stage renal disease: Secondary | ICD-10-CM | POA: Diagnosis not present

## 2021-11-19 DIAGNOSIS — N2581 Secondary hyperparathyroidism of renal origin: Secondary | ICD-10-CM | POA: Diagnosis not present

## 2021-11-19 DIAGNOSIS — D689 Coagulation defect, unspecified: Secondary | ICD-10-CM | POA: Diagnosis not present

## 2021-11-19 DIAGNOSIS — N186 End stage renal disease: Secondary | ICD-10-CM | POA: Diagnosis not present

## 2021-11-19 DIAGNOSIS — Z992 Dependence on renal dialysis: Secondary | ICD-10-CM | POA: Diagnosis not present

## 2021-11-19 DIAGNOSIS — E876 Hypokalemia: Secondary | ICD-10-CM | POA: Diagnosis not present

## 2021-11-21 DIAGNOSIS — E876 Hypokalemia: Secondary | ICD-10-CM | POA: Diagnosis not present

## 2021-11-21 DIAGNOSIS — D689 Coagulation defect, unspecified: Secondary | ICD-10-CM | POA: Diagnosis not present

## 2021-11-21 DIAGNOSIS — N2581 Secondary hyperparathyroidism of renal origin: Secondary | ICD-10-CM | POA: Diagnosis not present

## 2021-11-21 DIAGNOSIS — N186 End stage renal disease: Secondary | ICD-10-CM | POA: Diagnosis not present

## 2021-11-21 DIAGNOSIS — Z992 Dependence on renal dialysis: Secondary | ICD-10-CM | POA: Diagnosis not present

## 2021-11-23 DIAGNOSIS — N186 End stage renal disease: Secondary | ICD-10-CM | POA: Diagnosis not present

## 2021-11-23 DIAGNOSIS — Z992 Dependence on renal dialysis: Secondary | ICD-10-CM | POA: Diagnosis not present

## 2021-11-23 DIAGNOSIS — N2581 Secondary hyperparathyroidism of renal origin: Secondary | ICD-10-CM | POA: Diagnosis not present

## 2021-11-23 DIAGNOSIS — D689 Coagulation defect, unspecified: Secondary | ICD-10-CM | POA: Diagnosis not present

## 2021-11-23 DIAGNOSIS — E876 Hypokalemia: Secondary | ICD-10-CM | POA: Diagnosis not present

## 2021-11-26 DIAGNOSIS — N186 End stage renal disease: Secondary | ICD-10-CM | POA: Diagnosis not present

## 2021-11-26 DIAGNOSIS — D689 Coagulation defect, unspecified: Secondary | ICD-10-CM | POA: Diagnosis not present

## 2021-11-26 DIAGNOSIS — N2581 Secondary hyperparathyroidism of renal origin: Secondary | ICD-10-CM | POA: Diagnosis not present

## 2021-11-26 DIAGNOSIS — Z992 Dependence on renal dialysis: Secondary | ICD-10-CM | POA: Diagnosis not present

## 2021-11-26 DIAGNOSIS — E876 Hypokalemia: Secondary | ICD-10-CM | POA: Diagnosis not present

## 2021-11-27 DIAGNOSIS — Z992 Dependence on renal dialysis: Secondary | ICD-10-CM | POA: Diagnosis not present

## 2021-11-27 DIAGNOSIS — N186 End stage renal disease: Secondary | ICD-10-CM | POA: Diagnosis not present

## 2021-11-27 DIAGNOSIS — E1129 Type 2 diabetes mellitus with other diabetic kidney complication: Secondary | ICD-10-CM | POA: Diagnosis not present

## 2021-11-28 DIAGNOSIS — N186 End stage renal disease: Secondary | ICD-10-CM | POA: Diagnosis not present

## 2021-11-28 DIAGNOSIS — N2581 Secondary hyperparathyroidism of renal origin: Secondary | ICD-10-CM | POA: Diagnosis not present

## 2021-11-28 DIAGNOSIS — E876 Hypokalemia: Secondary | ICD-10-CM | POA: Diagnosis not present

## 2021-11-28 DIAGNOSIS — D689 Coagulation defect, unspecified: Secondary | ICD-10-CM | POA: Diagnosis not present

## 2021-11-28 DIAGNOSIS — Z992 Dependence on renal dialysis: Secondary | ICD-10-CM | POA: Diagnosis not present

## 2021-11-30 DIAGNOSIS — Z992 Dependence on renal dialysis: Secondary | ICD-10-CM | POA: Diagnosis not present

## 2021-11-30 DIAGNOSIS — N2581 Secondary hyperparathyroidism of renal origin: Secondary | ICD-10-CM | POA: Diagnosis not present

## 2021-11-30 DIAGNOSIS — N186 End stage renal disease: Secondary | ICD-10-CM | POA: Diagnosis not present

## 2021-11-30 DIAGNOSIS — E876 Hypokalemia: Secondary | ICD-10-CM | POA: Diagnosis not present

## 2021-11-30 DIAGNOSIS — D689 Coagulation defect, unspecified: Secondary | ICD-10-CM | POA: Diagnosis not present

## 2021-12-03 DIAGNOSIS — N186 End stage renal disease: Secondary | ICD-10-CM | POA: Diagnosis not present

## 2021-12-03 DIAGNOSIS — D689 Coagulation defect, unspecified: Secondary | ICD-10-CM | POA: Diagnosis not present

## 2021-12-03 DIAGNOSIS — N2581 Secondary hyperparathyroidism of renal origin: Secondary | ICD-10-CM | POA: Diagnosis not present

## 2021-12-03 DIAGNOSIS — E876 Hypokalemia: Secondary | ICD-10-CM | POA: Diagnosis not present

## 2021-12-03 DIAGNOSIS — Z992 Dependence on renal dialysis: Secondary | ICD-10-CM | POA: Diagnosis not present

## 2021-12-05 DIAGNOSIS — N186 End stage renal disease: Secondary | ICD-10-CM | POA: Diagnosis not present

## 2021-12-05 DIAGNOSIS — E876 Hypokalemia: Secondary | ICD-10-CM | POA: Diagnosis not present

## 2021-12-05 DIAGNOSIS — D689 Coagulation defect, unspecified: Secondary | ICD-10-CM | POA: Diagnosis not present

## 2021-12-05 DIAGNOSIS — N2581 Secondary hyperparathyroidism of renal origin: Secondary | ICD-10-CM | POA: Diagnosis not present

## 2021-12-05 DIAGNOSIS — Z992 Dependence on renal dialysis: Secondary | ICD-10-CM | POA: Diagnosis not present

## 2021-12-07 DIAGNOSIS — Z992 Dependence on renal dialysis: Secondary | ICD-10-CM | POA: Diagnosis not present

## 2021-12-07 DIAGNOSIS — D689 Coagulation defect, unspecified: Secondary | ICD-10-CM | POA: Diagnosis not present

## 2021-12-07 DIAGNOSIS — N2581 Secondary hyperparathyroidism of renal origin: Secondary | ICD-10-CM | POA: Diagnosis not present

## 2021-12-07 DIAGNOSIS — E876 Hypokalemia: Secondary | ICD-10-CM | POA: Diagnosis not present

## 2021-12-07 DIAGNOSIS — N186 End stage renal disease: Secondary | ICD-10-CM | POA: Diagnosis not present

## 2021-12-10 DIAGNOSIS — N186 End stage renal disease: Secondary | ICD-10-CM | POA: Diagnosis not present

## 2021-12-10 DIAGNOSIS — E876 Hypokalemia: Secondary | ICD-10-CM | POA: Diagnosis not present

## 2021-12-10 DIAGNOSIS — N2581 Secondary hyperparathyroidism of renal origin: Secondary | ICD-10-CM | POA: Diagnosis not present

## 2021-12-10 DIAGNOSIS — D689 Coagulation defect, unspecified: Secondary | ICD-10-CM | POA: Diagnosis not present

## 2021-12-10 DIAGNOSIS — Z992 Dependence on renal dialysis: Secondary | ICD-10-CM | POA: Diagnosis not present

## 2021-12-12 DIAGNOSIS — D689 Coagulation defect, unspecified: Secondary | ICD-10-CM | POA: Diagnosis not present

## 2021-12-12 DIAGNOSIS — Z992 Dependence on renal dialysis: Secondary | ICD-10-CM | POA: Diagnosis not present

## 2021-12-12 DIAGNOSIS — E876 Hypokalemia: Secondary | ICD-10-CM | POA: Diagnosis not present

## 2021-12-12 DIAGNOSIS — N186 End stage renal disease: Secondary | ICD-10-CM | POA: Diagnosis not present

## 2021-12-12 DIAGNOSIS — N2581 Secondary hyperparathyroidism of renal origin: Secondary | ICD-10-CM | POA: Diagnosis not present

## 2021-12-13 DIAGNOSIS — I340 Nonrheumatic mitral (valve) insufficiency: Secondary | ICD-10-CM | POA: Diagnosis not present

## 2021-12-13 DIAGNOSIS — E7800 Pure hypercholesterolemia, unspecified: Secondary | ICD-10-CM | POA: Diagnosis not present

## 2021-12-13 DIAGNOSIS — E1129 Type 2 diabetes mellitus with other diabetic kidney complication: Secondary | ICD-10-CM | POA: Diagnosis not present

## 2021-12-13 DIAGNOSIS — G4733 Obstructive sleep apnea (adult) (pediatric): Secondary | ICD-10-CM | POA: Diagnosis not present

## 2021-12-13 DIAGNOSIS — I129 Hypertensive chronic kidney disease with stage 1 through stage 4 chronic kidney disease, or unspecified chronic kidney disease: Secondary | ICD-10-CM | POA: Diagnosis not present

## 2021-12-13 DIAGNOSIS — Z79899 Other long term (current) drug therapy: Secondary | ICD-10-CM | POA: Diagnosis not present

## 2021-12-13 DIAGNOSIS — N2581 Secondary hyperparathyroidism of renal origin: Secondary | ICD-10-CM | POA: Diagnosis not present

## 2021-12-13 DIAGNOSIS — N185 Chronic kidney disease, stage 5: Secondary | ICD-10-CM | POA: Diagnosis not present

## 2021-12-13 DIAGNOSIS — R34 Anuria and oliguria: Secondary | ICD-10-CM | POA: Diagnosis not present

## 2021-12-13 DIAGNOSIS — I770 Arteriovenous fistula, acquired: Secondary | ICD-10-CM | POA: Diagnosis not present

## 2021-12-14 DIAGNOSIS — Z992 Dependence on renal dialysis: Secondary | ICD-10-CM | POA: Diagnosis not present

## 2021-12-14 DIAGNOSIS — N2581 Secondary hyperparathyroidism of renal origin: Secondary | ICD-10-CM | POA: Diagnosis not present

## 2021-12-14 DIAGNOSIS — N186 End stage renal disease: Secondary | ICD-10-CM | POA: Diagnosis not present

## 2021-12-14 DIAGNOSIS — E876 Hypokalemia: Secondary | ICD-10-CM | POA: Diagnosis not present

## 2021-12-14 DIAGNOSIS — D689 Coagulation defect, unspecified: Secondary | ICD-10-CM | POA: Diagnosis not present

## 2021-12-17 DIAGNOSIS — N186 End stage renal disease: Secondary | ICD-10-CM | POA: Diagnosis not present

## 2021-12-17 DIAGNOSIS — D689 Coagulation defect, unspecified: Secondary | ICD-10-CM | POA: Diagnosis not present

## 2021-12-17 DIAGNOSIS — N2581 Secondary hyperparathyroidism of renal origin: Secondary | ICD-10-CM | POA: Diagnosis not present

## 2021-12-17 DIAGNOSIS — Z992 Dependence on renal dialysis: Secondary | ICD-10-CM | POA: Diagnosis not present

## 2021-12-17 DIAGNOSIS — E876 Hypokalemia: Secondary | ICD-10-CM | POA: Diagnosis not present

## 2021-12-19 DIAGNOSIS — N2581 Secondary hyperparathyroidism of renal origin: Secondary | ICD-10-CM | POA: Diagnosis not present

## 2021-12-19 DIAGNOSIS — Z992 Dependence on renal dialysis: Secondary | ICD-10-CM | POA: Diagnosis not present

## 2021-12-19 DIAGNOSIS — D689 Coagulation defect, unspecified: Secondary | ICD-10-CM | POA: Diagnosis not present

## 2021-12-19 DIAGNOSIS — E876 Hypokalemia: Secondary | ICD-10-CM | POA: Diagnosis not present

## 2021-12-19 DIAGNOSIS — N186 End stage renal disease: Secondary | ICD-10-CM | POA: Diagnosis not present

## 2021-12-21 DIAGNOSIS — D689 Coagulation defect, unspecified: Secondary | ICD-10-CM | POA: Diagnosis not present

## 2021-12-21 DIAGNOSIS — Z992 Dependence on renal dialysis: Secondary | ICD-10-CM | POA: Diagnosis not present

## 2021-12-21 DIAGNOSIS — E876 Hypokalemia: Secondary | ICD-10-CM | POA: Diagnosis not present

## 2021-12-21 DIAGNOSIS — N2581 Secondary hyperparathyroidism of renal origin: Secondary | ICD-10-CM | POA: Diagnosis not present

## 2021-12-21 DIAGNOSIS — N186 End stage renal disease: Secondary | ICD-10-CM | POA: Diagnosis not present

## 2021-12-24 DIAGNOSIS — Z992 Dependence on renal dialysis: Secondary | ICD-10-CM | POA: Diagnosis not present

## 2021-12-24 DIAGNOSIS — N2581 Secondary hyperparathyroidism of renal origin: Secondary | ICD-10-CM | POA: Diagnosis not present

## 2021-12-24 DIAGNOSIS — N186 End stage renal disease: Secondary | ICD-10-CM | POA: Diagnosis not present

## 2021-12-24 DIAGNOSIS — D689 Coagulation defect, unspecified: Secondary | ICD-10-CM | POA: Diagnosis not present

## 2021-12-24 DIAGNOSIS — E876 Hypokalemia: Secondary | ICD-10-CM | POA: Diagnosis not present

## 2021-12-26 DIAGNOSIS — N186 End stage renal disease: Secondary | ICD-10-CM | POA: Diagnosis not present

## 2021-12-26 DIAGNOSIS — N2581 Secondary hyperparathyroidism of renal origin: Secondary | ICD-10-CM | POA: Diagnosis not present

## 2021-12-26 DIAGNOSIS — D689 Coagulation defect, unspecified: Secondary | ICD-10-CM | POA: Diagnosis not present

## 2021-12-26 DIAGNOSIS — Z992 Dependence on renal dialysis: Secondary | ICD-10-CM | POA: Diagnosis not present

## 2021-12-26 DIAGNOSIS — E876 Hypokalemia: Secondary | ICD-10-CM | POA: Diagnosis not present

## 2021-12-28 DIAGNOSIS — E1129 Type 2 diabetes mellitus with other diabetic kidney complication: Secondary | ICD-10-CM | POA: Diagnosis not present

## 2021-12-28 DIAGNOSIS — E876 Hypokalemia: Secondary | ICD-10-CM | POA: Diagnosis not present

## 2021-12-28 DIAGNOSIS — N2581 Secondary hyperparathyroidism of renal origin: Secondary | ICD-10-CM | POA: Diagnosis not present

## 2021-12-28 DIAGNOSIS — N186 End stage renal disease: Secondary | ICD-10-CM | POA: Diagnosis not present

## 2021-12-28 DIAGNOSIS — D689 Coagulation defect, unspecified: Secondary | ICD-10-CM | POA: Diagnosis not present

## 2021-12-28 DIAGNOSIS — Z992 Dependence on renal dialysis: Secondary | ICD-10-CM | POA: Diagnosis not present

## 2021-12-31 DIAGNOSIS — D689 Coagulation defect, unspecified: Secondary | ICD-10-CM | POA: Diagnosis not present

## 2021-12-31 DIAGNOSIS — N2581 Secondary hyperparathyroidism of renal origin: Secondary | ICD-10-CM | POA: Diagnosis not present

## 2021-12-31 DIAGNOSIS — N186 End stage renal disease: Secondary | ICD-10-CM | POA: Diagnosis not present

## 2021-12-31 DIAGNOSIS — E876 Hypokalemia: Secondary | ICD-10-CM | POA: Diagnosis not present

## 2021-12-31 DIAGNOSIS — R519 Headache, unspecified: Secondary | ICD-10-CM | POA: Diagnosis not present

## 2021-12-31 DIAGNOSIS — Z992 Dependence on renal dialysis: Secondary | ICD-10-CM | POA: Diagnosis not present

## 2022-01-02 DIAGNOSIS — R519 Headache, unspecified: Secondary | ICD-10-CM | POA: Diagnosis not present

## 2022-01-02 DIAGNOSIS — Z992 Dependence on renal dialysis: Secondary | ICD-10-CM | POA: Diagnosis not present

## 2022-01-02 DIAGNOSIS — E876 Hypokalemia: Secondary | ICD-10-CM | POA: Diagnosis not present

## 2022-01-02 DIAGNOSIS — N2581 Secondary hyperparathyroidism of renal origin: Secondary | ICD-10-CM | POA: Diagnosis not present

## 2022-01-02 DIAGNOSIS — N186 End stage renal disease: Secondary | ICD-10-CM | POA: Diagnosis not present

## 2022-01-02 DIAGNOSIS — D689 Coagulation defect, unspecified: Secondary | ICD-10-CM | POA: Diagnosis not present

## 2022-01-04 DIAGNOSIS — Z992 Dependence on renal dialysis: Secondary | ICD-10-CM | POA: Diagnosis not present

## 2022-01-04 DIAGNOSIS — R519 Headache, unspecified: Secondary | ICD-10-CM | POA: Diagnosis not present

## 2022-01-04 DIAGNOSIS — N186 End stage renal disease: Secondary | ICD-10-CM | POA: Diagnosis not present

## 2022-01-04 DIAGNOSIS — E876 Hypokalemia: Secondary | ICD-10-CM | POA: Diagnosis not present

## 2022-01-04 DIAGNOSIS — D689 Coagulation defect, unspecified: Secondary | ICD-10-CM | POA: Diagnosis not present

## 2022-01-04 DIAGNOSIS — N2581 Secondary hyperparathyroidism of renal origin: Secondary | ICD-10-CM | POA: Diagnosis not present

## 2022-01-07 DIAGNOSIS — E876 Hypokalemia: Secondary | ICD-10-CM | POA: Diagnosis not present

## 2022-01-07 DIAGNOSIS — Z992 Dependence on renal dialysis: Secondary | ICD-10-CM | POA: Diagnosis not present

## 2022-01-07 DIAGNOSIS — R519 Headache, unspecified: Secondary | ICD-10-CM | POA: Diagnosis not present

## 2022-01-07 DIAGNOSIS — D689 Coagulation defect, unspecified: Secondary | ICD-10-CM | POA: Diagnosis not present

## 2022-01-07 DIAGNOSIS — N186 End stage renal disease: Secondary | ICD-10-CM | POA: Diagnosis not present

## 2022-01-07 DIAGNOSIS — N2581 Secondary hyperparathyroidism of renal origin: Secondary | ICD-10-CM | POA: Diagnosis not present

## 2022-01-09 DIAGNOSIS — D689 Coagulation defect, unspecified: Secondary | ICD-10-CM | POA: Diagnosis not present

## 2022-01-09 DIAGNOSIS — E876 Hypokalemia: Secondary | ICD-10-CM | POA: Diagnosis not present

## 2022-01-09 DIAGNOSIS — N186 End stage renal disease: Secondary | ICD-10-CM | POA: Diagnosis not present

## 2022-01-09 DIAGNOSIS — N2581 Secondary hyperparathyroidism of renal origin: Secondary | ICD-10-CM | POA: Diagnosis not present

## 2022-01-09 DIAGNOSIS — R519 Headache, unspecified: Secondary | ICD-10-CM | POA: Diagnosis not present

## 2022-01-09 DIAGNOSIS — Z992 Dependence on renal dialysis: Secondary | ICD-10-CM | POA: Diagnosis not present

## 2022-01-11 DIAGNOSIS — N186 End stage renal disease: Secondary | ICD-10-CM | POA: Diagnosis not present

## 2022-01-11 DIAGNOSIS — Z992 Dependence on renal dialysis: Secondary | ICD-10-CM | POA: Diagnosis not present

## 2022-01-11 DIAGNOSIS — R519 Headache, unspecified: Secondary | ICD-10-CM | POA: Diagnosis not present

## 2022-01-11 DIAGNOSIS — E876 Hypokalemia: Secondary | ICD-10-CM | POA: Diagnosis not present

## 2022-01-11 DIAGNOSIS — N2581 Secondary hyperparathyroidism of renal origin: Secondary | ICD-10-CM | POA: Diagnosis not present

## 2022-01-11 DIAGNOSIS — D689 Coagulation defect, unspecified: Secondary | ICD-10-CM | POA: Diagnosis not present

## 2022-01-14 DIAGNOSIS — N186 End stage renal disease: Secondary | ICD-10-CM | POA: Diagnosis not present

## 2022-01-14 DIAGNOSIS — Z992 Dependence on renal dialysis: Secondary | ICD-10-CM | POA: Diagnosis not present

## 2022-01-14 DIAGNOSIS — N2581 Secondary hyperparathyroidism of renal origin: Secondary | ICD-10-CM | POA: Diagnosis not present

## 2022-01-14 DIAGNOSIS — D689 Coagulation defect, unspecified: Secondary | ICD-10-CM | POA: Diagnosis not present

## 2022-01-14 DIAGNOSIS — R519 Headache, unspecified: Secondary | ICD-10-CM | POA: Diagnosis not present

## 2022-01-14 DIAGNOSIS — E876 Hypokalemia: Secondary | ICD-10-CM | POA: Diagnosis not present

## 2022-01-16 DIAGNOSIS — N2581 Secondary hyperparathyroidism of renal origin: Secondary | ICD-10-CM | POA: Diagnosis not present

## 2022-01-16 DIAGNOSIS — D689 Coagulation defect, unspecified: Secondary | ICD-10-CM | POA: Diagnosis not present

## 2022-01-16 DIAGNOSIS — Z992 Dependence on renal dialysis: Secondary | ICD-10-CM | POA: Diagnosis not present

## 2022-01-16 DIAGNOSIS — R519 Headache, unspecified: Secondary | ICD-10-CM | POA: Diagnosis not present

## 2022-01-16 DIAGNOSIS — E876 Hypokalemia: Secondary | ICD-10-CM | POA: Diagnosis not present

## 2022-01-16 DIAGNOSIS — N186 End stage renal disease: Secondary | ICD-10-CM | POA: Diagnosis not present

## 2022-01-18 DIAGNOSIS — R519 Headache, unspecified: Secondary | ICD-10-CM | POA: Diagnosis not present

## 2022-01-18 DIAGNOSIS — D689 Coagulation defect, unspecified: Secondary | ICD-10-CM | POA: Diagnosis not present

## 2022-01-18 DIAGNOSIS — Z992 Dependence on renal dialysis: Secondary | ICD-10-CM | POA: Diagnosis not present

## 2022-01-18 DIAGNOSIS — N2581 Secondary hyperparathyroidism of renal origin: Secondary | ICD-10-CM | POA: Diagnosis not present

## 2022-01-18 DIAGNOSIS — N186 End stage renal disease: Secondary | ICD-10-CM | POA: Diagnosis not present

## 2022-01-18 DIAGNOSIS — E876 Hypokalemia: Secondary | ICD-10-CM | POA: Diagnosis not present

## 2022-01-21 DIAGNOSIS — N2581 Secondary hyperparathyroidism of renal origin: Secondary | ICD-10-CM | POA: Diagnosis not present

## 2022-01-21 DIAGNOSIS — D689 Coagulation defect, unspecified: Secondary | ICD-10-CM | POA: Diagnosis not present

## 2022-01-21 DIAGNOSIS — E876 Hypokalemia: Secondary | ICD-10-CM | POA: Diagnosis not present

## 2022-01-21 DIAGNOSIS — Z992 Dependence on renal dialysis: Secondary | ICD-10-CM | POA: Diagnosis not present

## 2022-01-21 DIAGNOSIS — R519 Headache, unspecified: Secondary | ICD-10-CM | POA: Diagnosis not present

## 2022-01-21 DIAGNOSIS — N186 End stage renal disease: Secondary | ICD-10-CM | POA: Diagnosis not present

## 2022-01-23 DIAGNOSIS — E876 Hypokalemia: Secondary | ICD-10-CM | POA: Diagnosis not present

## 2022-01-23 DIAGNOSIS — R519 Headache, unspecified: Secondary | ICD-10-CM | POA: Diagnosis not present

## 2022-01-23 DIAGNOSIS — N2581 Secondary hyperparathyroidism of renal origin: Secondary | ICD-10-CM | POA: Diagnosis not present

## 2022-01-23 DIAGNOSIS — D689 Coagulation defect, unspecified: Secondary | ICD-10-CM | POA: Diagnosis not present

## 2022-01-23 DIAGNOSIS — N186 End stage renal disease: Secondary | ICD-10-CM | POA: Diagnosis not present

## 2022-01-23 DIAGNOSIS — Z992 Dependence on renal dialysis: Secondary | ICD-10-CM | POA: Diagnosis not present

## 2022-01-25 DIAGNOSIS — R519 Headache, unspecified: Secondary | ICD-10-CM | POA: Diagnosis not present

## 2022-01-25 DIAGNOSIS — E876 Hypokalemia: Secondary | ICD-10-CM | POA: Diagnosis not present

## 2022-01-25 DIAGNOSIS — N2581 Secondary hyperparathyroidism of renal origin: Secondary | ICD-10-CM | POA: Diagnosis not present

## 2022-01-25 DIAGNOSIS — Z992 Dependence on renal dialysis: Secondary | ICD-10-CM | POA: Diagnosis not present

## 2022-01-25 DIAGNOSIS — D689 Coagulation defect, unspecified: Secondary | ICD-10-CM | POA: Diagnosis not present

## 2022-01-25 DIAGNOSIS — N186 End stage renal disease: Secondary | ICD-10-CM | POA: Diagnosis not present

## 2022-01-27 DIAGNOSIS — E1129 Type 2 diabetes mellitus with other diabetic kidney complication: Secondary | ICD-10-CM | POA: Diagnosis not present

## 2022-01-27 DIAGNOSIS — Z992 Dependence on renal dialysis: Secondary | ICD-10-CM | POA: Diagnosis not present

## 2022-01-27 DIAGNOSIS — N186 End stage renal disease: Secondary | ICD-10-CM | POA: Diagnosis not present

## 2022-01-28 DIAGNOSIS — D689 Coagulation defect, unspecified: Secondary | ICD-10-CM | POA: Diagnosis not present

## 2022-01-28 DIAGNOSIS — N186 End stage renal disease: Secondary | ICD-10-CM | POA: Diagnosis not present

## 2022-01-28 DIAGNOSIS — Z992 Dependence on renal dialysis: Secondary | ICD-10-CM | POA: Diagnosis not present

## 2022-01-28 DIAGNOSIS — N2581 Secondary hyperparathyroidism of renal origin: Secondary | ICD-10-CM | POA: Diagnosis not present

## 2022-01-30 DIAGNOSIS — Z992 Dependence on renal dialysis: Secondary | ICD-10-CM | POA: Diagnosis not present

## 2022-01-30 DIAGNOSIS — N186 End stage renal disease: Secondary | ICD-10-CM | POA: Diagnosis not present

## 2022-01-30 DIAGNOSIS — D689 Coagulation defect, unspecified: Secondary | ICD-10-CM | POA: Diagnosis not present

## 2022-01-30 DIAGNOSIS — N2581 Secondary hyperparathyroidism of renal origin: Secondary | ICD-10-CM | POA: Diagnosis not present

## 2022-02-01 DIAGNOSIS — D689 Coagulation defect, unspecified: Secondary | ICD-10-CM | POA: Diagnosis not present

## 2022-02-01 DIAGNOSIS — N186 End stage renal disease: Secondary | ICD-10-CM | POA: Diagnosis not present

## 2022-02-01 DIAGNOSIS — N2581 Secondary hyperparathyroidism of renal origin: Secondary | ICD-10-CM | POA: Diagnosis not present

## 2022-02-01 DIAGNOSIS — Z992 Dependence on renal dialysis: Secondary | ICD-10-CM | POA: Diagnosis not present

## 2022-02-04 DIAGNOSIS — N186 End stage renal disease: Secondary | ICD-10-CM | POA: Diagnosis not present

## 2022-02-04 DIAGNOSIS — D689 Coagulation defect, unspecified: Secondary | ICD-10-CM | POA: Diagnosis not present

## 2022-02-04 DIAGNOSIS — Z992 Dependence on renal dialysis: Secondary | ICD-10-CM | POA: Diagnosis not present

## 2022-02-04 DIAGNOSIS — N2581 Secondary hyperparathyroidism of renal origin: Secondary | ICD-10-CM | POA: Diagnosis not present

## 2022-02-06 DIAGNOSIS — D689 Coagulation defect, unspecified: Secondary | ICD-10-CM | POA: Diagnosis not present

## 2022-02-06 DIAGNOSIS — N186 End stage renal disease: Secondary | ICD-10-CM | POA: Diagnosis not present

## 2022-02-06 DIAGNOSIS — Z992 Dependence on renal dialysis: Secondary | ICD-10-CM | POA: Diagnosis not present

## 2022-02-06 DIAGNOSIS — N2581 Secondary hyperparathyroidism of renal origin: Secondary | ICD-10-CM | POA: Diagnosis not present

## 2022-02-08 DIAGNOSIS — Z992 Dependence on renal dialysis: Secondary | ICD-10-CM | POA: Diagnosis not present

## 2022-02-08 DIAGNOSIS — N2581 Secondary hyperparathyroidism of renal origin: Secondary | ICD-10-CM | POA: Diagnosis not present

## 2022-02-08 DIAGNOSIS — N186 End stage renal disease: Secondary | ICD-10-CM | POA: Diagnosis not present

## 2022-02-08 DIAGNOSIS — D689 Coagulation defect, unspecified: Secondary | ICD-10-CM | POA: Diagnosis not present

## 2022-02-11 DIAGNOSIS — N2581 Secondary hyperparathyroidism of renal origin: Secondary | ICD-10-CM | POA: Diagnosis not present

## 2022-02-11 DIAGNOSIS — D689 Coagulation defect, unspecified: Secondary | ICD-10-CM | POA: Diagnosis not present

## 2022-02-11 DIAGNOSIS — Z992 Dependence on renal dialysis: Secondary | ICD-10-CM | POA: Diagnosis not present

## 2022-02-11 DIAGNOSIS — N186 End stage renal disease: Secondary | ICD-10-CM | POA: Diagnosis not present

## 2022-02-13 DIAGNOSIS — Z992 Dependence on renal dialysis: Secondary | ICD-10-CM | POA: Diagnosis not present

## 2022-02-13 DIAGNOSIS — N2581 Secondary hyperparathyroidism of renal origin: Secondary | ICD-10-CM | POA: Diagnosis not present

## 2022-02-13 DIAGNOSIS — N186 End stage renal disease: Secondary | ICD-10-CM | POA: Diagnosis not present

## 2022-02-13 DIAGNOSIS — D689 Coagulation defect, unspecified: Secondary | ICD-10-CM | POA: Diagnosis not present

## 2022-02-13 DIAGNOSIS — L089 Local infection of the skin and subcutaneous tissue, unspecified: Secondary | ICD-10-CM | POA: Diagnosis not present

## 2022-02-15 DIAGNOSIS — Z992 Dependence on renal dialysis: Secondary | ICD-10-CM | POA: Diagnosis not present

## 2022-02-15 DIAGNOSIS — N2581 Secondary hyperparathyroidism of renal origin: Secondary | ICD-10-CM | POA: Diagnosis not present

## 2022-02-15 DIAGNOSIS — N186 End stage renal disease: Secondary | ICD-10-CM | POA: Diagnosis not present

## 2022-02-15 DIAGNOSIS — D689 Coagulation defect, unspecified: Secondary | ICD-10-CM | POA: Diagnosis not present

## 2022-02-18 DIAGNOSIS — Z992 Dependence on renal dialysis: Secondary | ICD-10-CM | POA: Diagnosis not present

## 2022-02-18 DIAGNOSIS — N2581 Secondary hyperparathyroidism of renal origin: Secondary | ICD-10-CM | POA: Diagnosis not present

## 2022-02-18 DIAGNOSIS — N186 End stage renal disease: Secondary | ICD-10-CM | POA: Diagnosis not present

## 2022-02-18 DIAGNOSIS — D689 Coagulation defect, unspecified: Secondary | ICD-10-CM | POA: Diagnosis not present

## 2022-02-20 DIAGNOSIS — N2581 Secondary hyperparathyroidism of renal origin: Secondary | ICD-10-CM | POA: Diagnosis not present

## 2022-02-20 DIAGNOSIS — D689 Coagulation defect, unspecified: Secondary | ICD-10-CM | POA: Diagnosis not present

## 2022-02-20 DIAGNOSIS — N186 End stage renal disease: Secondary | ICD-10-CM | POA: Diagnosis not present

## 2022-02-20 DIAGNOSIS — Z992 Dependence on renal dialysis: Secondary | ICD-10-CM | POA: Diagnosis not present

## 2022-02-22 DIAGNOSIS — N186 End stage renal disease: Secondary | ICD-10-CM | POA: Diagnosis not present

## 2022-02-22 DIAGNOSIS — D689 Coagulation defect, unspecified: Secondary | ICD-10-CM | POA: Diagnosis not present

## 2022-02-22 DIAGNOSIS — Z992 Dependence on renal dialysis: Secondary | ICD-10-CM | POA: Diagnosis not present

## 2022-02-22 DIAGNOSIS — N2581 Secondary hyperparathyroidism of renal origin: Secondary | ICD-10-CM | POA: Diagnosis not present

## 2022-02-25 DIAGNOSIS — N186 End stage renal disease: Secondary | ICD-10-CM | POA: Diagnosis not present

## 2022-02-25 DIAGNOSIS — D689 Coagulation defect, unspecified: Secondary | ICD-10-CM | POA: Diagnosis not present

## 2022-02-25 DIAGNOSIS — N2581 Secondary hyperparathyroidism of renal origin: Secondary | ICD-10-CM | POA: Diagnosis not present

## 2022-02-25 DIAGNOSIS — Z992 Dependence on renal dialysis: Secondary | ICD-10-CM | POA: Diagnosis not present

## 2022-02-27 DIAGNOSIS — Z992 Dependence on renal dialysis: Secondary | ICD-10-CM | POA: Diagnosis not present

## 2022-02-27 DIAGNOSIS — D689 Coagulation defect, unspecified: Secondary | ICD-10-CM | POA: Diagnosis not present

## 2022-02-27 DIAGNOSIS — E1129 Type 2 diabetes mellitus with other diabetic kidney complication: Secondary | ICD-10-CM | POA: Diagnosis not present

## 2022-02-27 DIAGNOSIS — N186 End stage renal disease: Secondary | ICD-10-CM | POA: Diagnosis not present

## 2022-02-27 DIAGNOSIS — N2581 Secondary hyperparathyroidism of renal origin: Secondary | ICD-10-CM | POA: Diagnosis not present

## 2022-03-01 DIAGNOSIS — N186 End stage renal disease: Secondary | ICD-10-CM | POA: Diagnosis not present

## 2022-03-01 DIAGNOSIS — D689 Coagulation defect, unspecified: Secondary | ICD-10-CM | POA: Diagnosis not present

## 2022-03-01 DIAGNOSIS — Z992 Dependence on renal dialysis: Secondary | ICD-10-CM | POA: Diagnosis not present

## 2022-03-01 DIAGNOSIS — N2581 Secondary hyperparathyroidism of renal origin: Secondary | ICD-10-CM | POA: Diagnosis not present

## 2022-03-01 DIAGNOSIS — E876 Hypokalemia: Secondary | ICD-10-CM | POA: Diagnosis not present

## 2022-03-04 DIAGNOSIS — E876 Hypokalemia: Secondary | ICD-10-CM | POA: Diagnosis not present

## 2022-03-04 DIAGNOSIS — Z992 Dependence on renal dialysis: Secondary | ICD-10-CM | POA: Diagnosis not present

## 2022-03-04 DIAGNOSIS — N186 End stage renal disease: Secondary | ICD-10-CM | POA: Diagnosis not present

## 2022-03-04 DIAGNOSIS — N2581 Secondary hyperparathyroidism of renal origin: Secondary | ICD-10-CM | POA: Diagnosis not present

## 2022-03-04 DIAGNOSIS — D689 Coagulation defect, unspecified: Secondary | ICD-10-CM | POA: Diagnosis not present

## 2022-03-06 DIAGNOSIS — E876 Hypokalemia: Secondary | ICD-10-CM | POA: Diagnosis not present

## 2022-03-06 DIAGNOSIS — N2581 Secondary hyperparathyroidism of renal origin: Secondary | ICD-10-CM | POA: Diagnosis not present

## 2022-03-06 DIAGNOSIS — N186 End stage renal disease: Secondary | ICD-10-CM | POA: Diagnosis not present

## 2022-03-06 DIAGNOSIS — D689 Coagulation defect, unspecified: Secondary | ICD-10-CM | POA: Diagnosis not present

## 2022-03-06 DIAGNOSIS — Z992 Dependence on renal dialysis: Secondary | ICD-10-CM | POA: Diagnosis not present

## 2022-03-08 DIAGNOSIS — D689 Coagulation defect, unspecified: Secondary | ICD-10-CM | POA: Diagnosis not present

## 2022-03-08 DIAGNOSIS — Z992 Dependence on renal dialysis: Secondary | ICD-10-CM | POA: Diagnosis not present

## 2022-03-08 DIAGNOSIS — N186 End stage renal disease: Secondary | ICD-10-CM | POA: Diagnosis not present

## 2022-03-08 DIAGNOSIS — E876 Hypokalemia: Secondary | ICD-10-CM | POA: Diagnosis not present

## 2022-03-08 DIAGNOSIS — N2581 Secondary hyperparathyroidism of renal origin: Secondary | ICD-10-CM | POA: Diagnosis not present

## 2022-03-11 DIAGNOSIS — Z992 Dependence on renal dialysis: Secondary | ICD-10-CM | POA: Diagnosis not present

## 2022-03-11 DIAGNOSIS — N186 End stage renal disease: Secondary | ICD-10-CM | POA: Diagnosis not present

## 2022-03-11 DIAGNOSIS — E876 Hypokalemia: Secondary | ICD-10-CM | POA: Diagnosis not present

## 2022-03-11 DIAGNOSIS — N2581 Secondary hyperparathyroidism of renal origin: Secondary | ICD-10-CM | POA: Diagnosis not present

## 2022-03-11 DIAGNOSIS — D689 Coagulation defect, unspecified: Secondary | ICD-10-CM | POA: Diagnosis not present

## 2022-03-13 DIAGNOSIS — Z992 Dependence on renal dialysis: Secondary | ICD-10-CM | POA: Diagnosis not present

## 2022-03-13 DIAGNOSIS — E876 Hypokalemia: Secondary | ICD-10-CM | POA: Diagnosis not present

## 2022-03-13 DIAGNOSIS — D689 Coagulation defect, unspecified: Secondary | ICD-10-CM | POA: Diagnosis not present

## 2022-03-13 DIAGNOSIS — N186 End stage renal disease: Secondary | ICD-10-CM | POA: Diagnosis not present

## 2022-03-13 DIAGNOSIS — N2581 Secondary hyperparathyroidism of renal origin: Secondary | ICD-10-CM | POA: Diagnosis not present

## 2022-03-15 DIAGNOSIS — Z992 Dependence on renal dialysis: Secondary | ICD-10-CM | POA: Diagnosis not present

## 2022-03-15 DIAGNOSIS — N186 End stage renal disease: Secondary | ICD-10-CM | POA: Diagnosis not present

## 2022-03-15 DIAGNOSIS — D689 Coagulation defect, unspecified: Secondary | ICD-10-CM | POA: Diagnosis not present

## 2022-03-15 DIAGNOSIS — E876 Hypokalemia: Secondary | ICD-10-CM | POA: Diagnosis not present

## 2022-03-15 DIAGNOSIS — N2581 Secondary hyperparathyroidism of renal origin: Secondary | ICD-10-CM | POA: Diagnosis not present

## 2022-03-18 DIAGNOSIS — D689 Coagulation defect, unspecified: Secondary | ICD-10-CM | POA: Diagnosis not present

## 2022-03-18 DIAGNOSIS — Z992 Dependence on renal dialysis: Secondary | ICD-10-CM | POA: Diagnosis not present

## 2022-03-18 DIAGNOSIS — E876 Hypokalemia: Secondary | ICD-10-CM | POA: Diagnosis not present

## 2022-03-18 DIAGNOSIS — N186 End stage renal disease: Secondary | ICD-10-CM | POA: Diagnosis not present

## 2022-03-18 DIAGNOSIS — N2581 Secondary hyperparathyroidism of renal origin: Secondary | ICD-10-CM | POA: Diagnosis not present

## 2022-03-20 DIAGNOSIS — E876 Hypokalemia: Secondary | ICD-10-CM | POA: Diagnosis not present

## 2022-03-20 DIAGNOSIS — Z992 Dependence on renal dialysis: Secondary | ICD-10-CM | POA: Diagnosis not present

## 2022-03-20 DIAGNOSIS — D689 Coagulation defect, unspecified: Secondary | ICD-10-CM | POA: Diagnosis not present

## 2022-03-20 DIAGNOSIS — N2581 Secondary hyperparathyroidism of renal origin: Secondary | ICD-10-CM | POA: Diagnosis not present

## 2022-03-20 DIAGNOSIS — N186 End stage renal disease: Secondary | ICD-10-CM | POA: Diagnosis not present

## 2022-03-22 DIAGNOSIS — E876 Hypokalemia: Secondary | ICD-10-CM | POA: Diagnosis not present

## 2022-03-22 DIAGNOSIS — D689 Coagulation defect, unspecified: Secondary | ICD-10-CM | POA: Diagnosis not present

## 2022-03-22 DIAGNOSIS — N186 End stage renal disease: Secondary | ICD-10-CM | POA: Diagnosis not present

## 2022-03-22 DIAGNOSIS — Z992 Dependence on renal dialysis: Secondary | ICD-10-CM | POA: Diagnosis not present

## 2022-03-22 DIAGNOSIS — N2581 Secondary hyperparathyroidism of renal origin: Secondary | ICD-10-CM | POA: Diagnosis not present

## 2022-03-25 DIAGNOSIS — N2581 Secondary hyperparathyroidism of renal origin: Secondary | ICD-10-CM | POA: Diagnosis not present

## 2022-03-25 DIAGNOSIS — E876 Hypokalemia: Secondary | ICD-10-CM | POA: Diagnosis not present

## 2022-03-25 DIAGNOSIS — N186 End stage renal disease: Secondary | ICD-10-CM | POA: Diagnosis not present

## 2022-03-25 DIAGNOSIS — Z992 Dependence on renal dialysis: Secondary | ICD-10-CM | POA: Diagnosis not present

## 2022-03-25 DIAGNOSIS — D689 Coagulation defect, unspecified: Secondary | ICD-10-CM | POA: Diagnosis not present

## 2022-03-27 DIAGNOSIS — Z992 Dependence on renal dialysis: Secondary | ICD-10-CM | POA: Diagnosis not present

## 2022-03-27 DIAGNOSIS — E876 Hypokalemia: Secondary | ICD-10-CM | POA: Diagnosis not present

## 2022-03-27 DIAGNOSIS — D689 Coagulation defect, unspecified: Secondary | ICD-10-CM | POA: Diagnosis not present

## 2022-03-27 DIAGNOSIS — N186 End stage renal disease: Secondary | ICD-10-CM | POA: Diagnosis not present

## 2022-03-27 DIAGNOSIS — N2581 Secondary hyperparathyroidism of renal origin: Secondary | ICD-10-CM | POA: Diagnosis not present

## 2022-03-29 DIAGNOSIS — E1129 Type 2 diabetes mellitus with other diabetic kidney complication: Secondary | ICD-10-CM | POA: Diagnosis not present

## 2022-03-29 DIAGNOSIS — N2581 Secondary hyperparathyroidism of renal origin: Secondary | ICD-10-CM | POA: Diagnosis not present

## 2022-03-29 DIAGNOSIS — D689 Coagulation defect, unspecified: Secondary | ICD-10-CM | POA: Diagnosis not present

## 2022-03-29 DIAGNOSIS — Z992 Dependence on renal dialysis: Secondary | ICD-10-CM | POA: Diagnosis not present

## 2022-03-29 DIAGNOSIS — E876 Hypokalemia: Secondary | ICD-10-CM | POA: Diagnosis not present

## 2022-03-29 DIAGNOSIS — N186 End stage renal disease: Secondary | ICD-10-CM | POA: Diagnosis not present

## 2022-04-01 DIAGNOSIS — N186 End stage renal disease: Secondary | ICD-10-CM | POA: Diagnosis not present

## 2022-04-01 DIAGNOSIS — N2581 Secondary hyperparathyroidism of renal origin: Secondary | ICD-10-CM | POA: Diagnosis not present

## 2022-04-01 DIAGNOSIS — Z992 Dependence on renal dialysis: Secondary | ICD-10-CM | POA: Diagnosis not present

## 2022-04-01 DIAGNOSIS — D689 Coagulation defect, unspecified: Secondary | ICD-10-CM | POA: Diagnosis not present

## 2022-04-03 DIAGNOSIS — D689 Coagulation defect, unspecified: Secondary | ICD-10-CM | POA: Diagnosis not present

## 2022-04-03 DIAGNOSIS — Z992 Dependence on renal dialysis: Secondary | ICD-10-CM | POA: Diagnosis not present

## 2022-04-03 DIAGNOSIS — N2581 Secondary hyperparathyroidism of renal origin: Secondary | ICD-10-CM | POA: Diagnosis not present

## 2022-04-03 DIAGNOSIS — N186 End stage renal disease: Secondary | ICD-10-CM | POA: Diagnosis not present

## 2022-04-05 DIAGNOSIS — N186 End stage renal disease: Secondary | ICD-10-CM | POA: Diagnosis not present

## 2022-04-05 DIAGNOSIS — N2581 Secondary hyperparathyroidism of renal origin: Secondary | ICD-10-CM | POA: Diagnosis not present

## 2022-04-05 DIAGNOSIS — D689 Coagulation defect, unspecified: Secondary | ICD-10-CM | POA: Diagnosis not present

## 2022-04-05 DIAGNOSIS — Z992 Dependence on renal dialysis: Secondary | ICD-10-CM | POA: Diagnosis not present

## 2022-04-08 DIAGNOSIS — Z992 Dependence on renal dialysis: Secondary | ICD-10-CM | POA: Diagnosis not present

## 2022-04-08 DIAGNOSIS — N186 End stage renal disease: Secondary | ICD-10-CM | POA: Diagnosis not present

## 2022-04-08 DIAGNOSIS — D689 Coagulation defect, unspecified: Secondary | ICD-10-CM | POA: Diagnosis not present

## 2022-04-08 DIAGNOSIS — N2581 Secondary hyperparathyroidism of renal origin: Secondary | ICD-10-CM | POA: Diagnosis not present

## 2022-04-10 DIAGNOSIS — Z992 Dependence on renal dialysis: Secondary | ICD-10-CM | POA: Diagnosis not present

## 2022-04-10 DIAGNOSIS — N2581 Secondary hyperparathyroidism of renal origin: Secondary | ICD-10-CM | POA: Diagnosis not present

## 2022-04-10 DIAGNOSIS — N186 End stage renal disease: Secondary | ICD-10-CM | POA: Diagnosis not present

## 2022-04-10 DIAGNOSIS — D689 Coagulation defect, unspecified: Secondary | ICD-10-CM | POA: Diagnosis not present

## 2022-04-12 DIAGNOSIS — N2581 Secondary hyperparathyroidism of renal origin: Secondary | ICD-10-CM | POA: Diagnosis not present

## 2022-04-12 DIAGNOSIS — Z992 Dependence on renal dialysis: Secondary | ICD-10-CM | POA: Diagnosis not present

## 2022-04-12 DIAGNOSIS — N186 End stage renal disease: Secondary | ICD-10-CM | POA: Diagnosis not present

## 2022-04-12 DIAGNOSIS — D689 Coagulation defect, unspecified: Secondary | ICD-10-CM | POA: Diagnosis not present

## 2022-04-15 DIAGNOSIS — Z992 Dependence on renal dialysis: Secondary | ICD-10-CM | POA: Diagnosis not present

## 2022-04-15 DIAGNOSIS — D689 Coagulation defect, unspecified: Secondary | ICD-10-CM | POA: Diagnosis not present

## 2022-04-15 DIAGNOSIS — N186 End stage renal disease: Secondary | ICD-10-CM | POA: Diagnosis not present

## 2022-04-15 DIAGNOSIS — N2581 Secondary hyperparathyroidism of renal origin: Secondary | ICD-10-CM | POA: Diagnosis not present

## 2022-04-17 DIAGNOSIS — N186 End stage renal disease: Secondary | ICD-10-CM | POA: Diagnosis not present

## 2022-04-17 DIAGNOSIS — D689 Coagulation defect, unspecified: Secondary | ICD-10-CM | POA: Diagnosis not present

## 2022-04-17 DIAGNOSIS — Z992 Dependence on renal dialysis: Secondary | ICD-10-CM | POA: Diagnosis not present

## 2022-04-17 DIAGNOSIS — N2581 Secondary hyperparathyroidism of renal origin: Secondary | ICD-10-CM | POA: Diagnosis not present

## 2022-04-19 DIAGNOSIS — D689 Coagulation defect, unspecified: Secondary | ICD-10-CM | POA: Diagnosis not present

## 2022-04-19 DIAGNOSIS — N2581 Secondary hyperparathyroidism of renal origin: Secondary | ICD-10-CM | POA: Diagnosis not present

## 2022-04-19 DIAGNOSIS — N186 End stage renal disease: Secondary | ICD-10-CM | POA: Diagnosis not present

## 2022-04-19 DIAGNOSIS — Z992 Dependence on renal dialysis: Secondary | ICD-10-CM | POA: Diagnosis not present

## 2022-04-22 DIAGNOSIS — N186 End stage renal disease: Secondary | ICD-10-CM | POA: Diagnosis not present

## 2022-04-22 DIAGNOSIS — D689 Coagulation defect, unspecified: Secondary | ICD-10-CM | POA: Diagnosis not present

## 2022-04-22 DIAGNOSIS — Z992 Dependence on renal dialysis: Secondary | ICD-10-CM | POA: Diagnosis not present

## 2022-04-22 DIAGNOSIS — N2581 Secondary hyperparathyroidism of renal origin: Secondary | ICD-10-CM | POA: Diagnosis not present

## 2022-04-24 DIAGNOSIS — Z992 Dependence on renal dialysis: Secondary | ICD-10-CM | POA: Diagnosis not present

## 2022-04-24 DIAGNOSIS — D689 Coagulation defect, unspecified: Secondary | ICD-10-CM | POA: Diagnosis not present

## 2022-04-24 DIAGNOSIS — N186 End stage renal disease: Secondary | ICD-10-CM | POA: Diagnosis not present

## 2022-04-24 DIAGNOSIS — N2581 Secondary hyperparathyroidism of renal origin: Secondary | ICD-10-CM | POA: Diagnosis not present

## 2022-04-25 ENCOUNTER — Telehealth: Payer: Self-pay

## 2022-04-25 NOTE — Patient Outreach (Signed)
  Care Management   Outreach Note  04/25/2022 Name: Peter [name] MRN: [id number] DOB: 1951/09/10  An unsuccessful telephone outreach was attempted today. The patient was referred to the case management team for assistance with care management and care coordination.    Follow Up Plan:  A HIPAA compliant voice message was left today requesting a return call.   [place] [facility] [phone]

## 2022-04-26 DIAGNOSIS — Z992 Dependence on renal dialysis: Secondary | ICD-10-CM | POA: Diagnosis not present

## 2022-04-26 DIAGNOSIS — N186 End stage renal disease: Secondary | ICD-10-CM | POA: Diagnosis not present

## 2022-04-26 DIAGNOSIS — D689 Coagulation defect, unspecified: Secondary | ICD-10-CM | POA: Diagnosis not present

## 2022-04-26 DIAGNOSIS — N2581 Secondary hyperparathyroidism of renal origin: Secondary | ICD-10-CM | POA: Diagnosis not present

## 2022-04-29 DIAGNOSIS — N186 End stage renal disease: Secondary | ICD-10-CM | POA: Diagnosis not present

## 2022-04-29 DIAGNOSIS — E1129 Type 2 diabetes mellitus with other diabetic kidney complication: Secondary | ICD-10-CM | POA: Diagnosis not present

## 2022-04-29 DIAGNOSIS — N2581 Secondary hyperparathyroidism of renal origin: Secondary | ICD-10-CM | POA: Diagnosis not present

## 2022-04-29 DIAGNOSIS — Z992 Dependence on renal dialysis: Secondary | ICD-10-CM | POA: Diagnosis not present

## 2022-04-29 DIAGNOSIS — D689 Coagulation defect, unspecified: Secondary | ICD-10-CM | POA: Diagnosis not present

## 2022-05-01 DIAGNOSIS — Z992 Dependence on renal dialysis: Secondary | ICD-10-CM | POA: Diagnosis not present

## 2022-05-01 DIAGNOSIS — N2581 Secondary hyperparathyroidism of renal origin: Secondary | ICD-10-CM | POA: Diagnosis not present

## 2022-05-01 DIAGNOSIS — D689 Coagulation defect, unspecified: Secondary | ICD-10-CM | POA: Diagnosis not present

## 2022-05-01 DIAGNOSIS — N186 End stage renal disease: Secondary | ICD-10-CM | POA: Diagnosis not present

## 2022-05-03 DIAGNOSIS — N2581 Secondary hyperparathyroidism of renal origin: Secondary | ICD-10-CM | POA: Diagnosis not present

## 2022-05-03 DIAGNOSIS — Z992 Dependence on renal dialysis: Secondary | ICD-10-CM | POA: Diagnosis not present

## 2022-05-03 DIAGNOSIS — D689 Coagulation defect, unspecified: Secondary | ICD-10-CM | POA: Diagnosis not present

## 2022-05-03 DIAGNOSIS — N186 End stage renal disease: Secondary | ICD-10-CM | POA: Diagnosis not present

## 2022-05-06 DIAGNOSIS — N186 End stage renal disease: Secondary | ICD-10-CM | POA: Diagnosis not present

## 2022-05-06 DIAGNOSIS — D689 Coagulation defect, unspecified: Secondary | ICD-10-CM | POA: Diagnosis not present

## 2022-05-06 DIAGNOSIS — Z992 Dependence on renal dialysis: Secondary | ICD-10-CM | POA: Diagnosis not present

## 2022-05-06 DIAGNOSIS — N2581 Secondary hyperparathyroidism of renal origin: Secondary | ICD-10-CM | POA: Diagnosis not present

## 2022-05-08 DIAGNOSIS — N2581 Secondary hyperparathyroidism of renal origin: Secondary | ICD-10-CM | POA: Diagnosis not present

## 2022-05-08 DIAGNOSIS — N186 End stage renal disease: Secondary | ICD-10-CM | POA: Diagnosis not present

## 2022-05-08 DIAGNOSIS — D689 Coagulation defect, unspecified: Secondary | ICD-10-CM | POA: Diagnosis not present

## 2022-05-08 DIAGNOSIS — Z992 Dependence on renal dialysis: Secondary | ICD-10-CM | POA: Diagnosis not present

## 2022-05-10 DIAGNOSIS — N2581 Secondary hyperparathyroidism of renal origin: Secondary | ICD-10-CM | POA: Diagnosis not present

## 2022-05-10 DIAGNOSIS — N186 End stage renal disease: Secondary | ICD-10-CM | POA: Diagnosis not present

## 2022-05-10 DIAGNOSIS — D689 Coagulation defect, unspecified: Secondary | ICD-10-CM | POA: Diagnosis not present

## 2022-05-10 DIAGNOSIS — Z992 Dependence on renal dialysis: Secondary | ICD-10-CM | POA: Diagnosis not present

## 2022-05-13 DIAGNOSIS — D689 Coagulation defect, unspecified: Secondary | ICD-10-CM | POA: Diagnosis not present

## 2022-05-13 DIAGNOSIS — N186 End stage renal disease: Secondary | ICD-10-CM | POA: Diagnosis not present

## 2022-05-13 DIAGNOSIS — N2581 Secondary hyperparathyroidism of renal origin: Secondary | ICD-10-CM | POA: Diagnosis not present

## 2022-05-13 DIAGNOSIS — Z992 Dependence on renal dialysis: Secondary | ICD-10-CM | POA: Diagnosis not present

## 2022-05-15 DIAGNOSIS — D689 Coagulation defect, unspecified: Secondary | ICD-10-CM | POA: Diagnosis not present

## 2022-05-15 DIAGNOSIS — N2581 Secondary hyperparathyroidism of renal origin: Secondary | ICD-10-CM | POA: Diagnosis not present

## 2022-05-15 DIAGNOSIS — N186 End stage renal disease: Secondary | ICD-10-CM | POA: Diagnosis not present

## 2022-05-15 DIAGNOSIS — Z992 Dependence on renal dialysis: Secondary | ICD-10-CM | POA: Diagnosis not present

## 2022-05-17 DIAGNOSIS — N2581 Secondary hyperparathyroidism of renal origin: Secondary | ICD-10-CM | POA: Diagnosis not present

## 2022-05-17 DIAGNOSIS — Z992 Dependence on renal dialysis: Secondary | ICD-10-CM | POA: Diagnosis not present

## 2022-05-17 DIAGNOSIS — N186 End stage renal disease: Secondary | ICD-10-CM | POA: Diagnosis not present

## 2022-05-17 DIAGNOSIS — D689 Coagulation defect, unspecified: Secondary | ICD-10-CM | POA: Diagnosis not present

## 2022-05-20 ENCOUNTER — Ambulatory Visit: Payer: Medicare Other

## 2022-05-20 DIAGNOSIS — N2581 Secondary hyperparathyroidism of renal origin: Secondary | ICD-10-CM | POA: Diagnosis not present

## 2022-05-20 DIAGNOSIS — D689 Coagulation defect, unspecified: Secondary | ICD-10-CM | POA: Diagnosis not present

## 2022-05-20 DIAGNOSIS — N186 End stage renal disease: Secondary | ICD-10-CM | POA: Diagnosis not present

## 2022-05-20 DIAGNOSIS — Z992 Dependence on renal dialysis: Secondary | ICD-10-CM | POA: Diagnosis not present

## 2022-05-20 NOTE — Patient Outreach (Signed)
  Care Coordination   Initial Visit Note   05/20/2022 Name: Peter [name] MRN: [id number] DOB: 1951-01-31  Peter [name] is a 71 y.o. year old male who sees Gaynelle Arabian, MD for primary care. I spoke with  [name] by phone today  What matters to the patients health and wellness today?  Health Maintenance    Goals Addressed             This Visit's Progress    Health Maintenance/Multiple Chronic Illnesses       Care Coordination Interventions: Reviewed plan for disease management. Patient has multiple chronic illnesses. Reports managing well. Currently receiving dialysis every Monday, Wednesday, and Friday with [facility]. Reports adhering to treatment plans and attending medical appointments as scheduled. Reviewed medications. Reports managing well. Denies concerns r/t medication management or prescription cost. Patient is due for clinic visit and AWV. Reports pending appointments in September. Care Gaps reviewed. Will readdress after clinic visit in September.         SDOH assessments and interventions completed:  Yes  SDOH Interventions Today    Flowsheet Row Most Recent Value  SDOH Interventions   Food Insecurity Interventions Intervention Not Indicated  Transportation Interventions Intervention Not Indicated        Care Coordination Interventions Activated:  Yes  Care Coordination Interventions:  Yes, provided   Follow up plan: Follow up call scheduled for July 03, 2022.    Encounter Outcome:  Pt. Visit Completed    [place] [facility] [phone]

## 2022-05-20 NOTE — Patient Instructions (Signed)
Visit Information Thank you for allowing the Care Management team to participate in your care. It was great speaking with you today!  Following are the goals we discussed today:   Goals Addressed             This Visit's Progress    Health Maintenance/Multiple Chronic Illnesses       Care Coordination Interventions: Reviewed plan for disease management. Patient has multiple chronic illnesses. Reports managing well. Currently receiving dialysis every Monday, Wednesday, and Friday with [facility]. Reports adhering to treatment plans and attending medical appointments as scheduled. Reviewed medications. Reports managing well. Denies concerns r/t medication management or prescription cost. Patient is due for clinic visit and AWV. Reports pending appointments in September. Care Gaps reviewed. Will readdress after clinic visit in September.         Our next appointment is by telephone on July 03, 2022 at 3:30 pm. Please call the care guide team at [phone] if you need to cancel or reschedule your appointment.   Peter [name] verbalized understanding of information discussed during the telephonic outreach. Declined need for mailed instructions or resources.   A member of the care management team will follow up in October.  [place] Management [phone]

## 2022-05-22 DIAGNOSIS — N2581 Secondary hyperparathyroidism of renal origin: Secondary | ICD-10-CM | POA: Diagnosis not present

## 2022-05-22 DIAGNOSIS — D689 Coagulation defect, unspecified: Secondary | ICD-10-CM | POA: Diagnosis not present

## 2022-05-22 DIAGNOSIS — Z992 Dependence on renal dialysis: Secondary | ICD-10-CM | POA: Diagnosis not present

## 2022-05-22 DIAGNOSIS — N186 End stage renal disease: Secondary | ICD-10-CM | POA: Diagnosis not present

## 2022-05-24 DIAGNOSIS — N2581 Secondary hyperparathyroidism of renal origin: Secondary | ICD-10-CM | POA: Diagnosis not present

## 2022-05-24 DIAGNOSIS — Z992 Dependence on renal dialysis: Secondary | ICD-10-CM | POA: Diagnosis not present

## 2022-05-24 DIAGNOSIS — D689 Coagulation defect, unspecified: Secondary | ICD-10-CM | POA: Diagnosis not present

## 2022-05-24 DIAGNOSIS — N186 End stage renal disease: Secondary | ICD-10-CM | POA: Diagnosis not present

## 2022-05-27 DIAGNOSIS — Z992 Dependence on renal dialysis: Secondary | ICD-10-CM | POA: Diagnosis not present

## 2022-05-27 DIAGNOSIS — N186 End stage renal disease: Secondary | ICD-10-CM | POA: Diagnosis not present

## 2022-05-27 DIAGNOSIS — N2581 Secondary hyperparathyroidism of renal origin: Secondary | ICD-10-CM | POA: Diagnosis not present

## 2022-05-27 DIAGNOSIS — D689 Coagulation defect, unspecified: Secondary | ICD-10-CM | POA: Diagnosis not present

## 2022-05-29 DIAGNOSIS — N186 End stage renal disease: Secondary | ICD-10-CM | POA: Diagnosis not present

## 2022-05-29 DIAGNOSIS — D689 Coagulation defect, unspecified: Secondary | ICD-10-CM | POA: Diagnosis not present

## 2022-05-29 DIAGNOSIS — N2581 Secondary hyperparathyroidism of renal origin: Secondary | ICD-10-CM | POA: Diagnosis not present

## 2022-05-29 DIAGNOSIS — Z992 Dependence on renal dialysis: Secondary | ICD-10-CM | POA: Diagnosis not present

## 2022-05-30 DIAGNOSIS — N186 End stage renal disease: Secondary | ICD-10-CM | POA: Diagnosis not present

## 2022-05-30 DIAGNOSIS — Z992 Dependence on renal dialysis: Secondary | ICD-10-CM | POA: Diagnosis not present

## 2022-05-30 DIAGNOSIS — E1129 Type 2 diabetes mellitus with other diabetic kidney complication: Secondary | ICD-10-CM | POA: Diagnosis not present

## 2022-05-31 DIAGNOSIS — D689 Coagulation defect, unspecified: Secondary | ICD-10-CM | POA: Diagnosis not present

## 2022-05-31 DIAGNOSIS — N2581 Secondary hyperparathyroidism of renal origin: Secondary | ICD-10-CM | POA: Diagnosis not present

## 2022-05-31 DIAGNOSIS — Z992 Dependence on renal dialysis: Secondary | ICD-10-CM | POA: Diagnosis not present

## 2022-05-31 DIAGNOSIS — N186 End stage renal disease: Secondary | ICD-10-CM | POA: Diagnosis not present

## 2022-06-03 DIAGNOSIS — Z992 Dependence on renal dialysis: Secondary | ICD-10-CM | POA: Diagnosis not present

## 2022-06-03 DIAGNOSIS — D689 Coagulation defect, unspecified: Secondary | ICD-10-CM | POA: Diagnosis not present

## 2022-06-03 DIAGNOSIS — N2581 Secondary hyperparathyroidism of renal origin: Secondary | ICD-10-CM | POA: Diagnosis not present

## 2022-06-03 DIAGNOSIS — N186 End stage renal disease: Secondary | ICD-10-CM | POA: Diagnosis not present

## 2022-06-05 DIAGNOSIS — Z992 Dependence on renal dialysis: Secondary | ICD-10-CM | POA: Diagnosis not present

## 2022-06-05 DIAGNOSIS — D689 Coagulation defect, unspecified: Secondary | ICD-10-CM | POA: Diagnosis not present

## 2022-06-05 DIAGNOSIS — N186 End stage renal disease: Secondary | ICD-10-CM | POA: Diagnosis not present

## 2022-06-05 DIAGNOSIS — N2581 Secondary hyperparathyroidism of renal origin: Secondary | ICD-10-CM | POA: Diagnosis not present

## 2022-06-07 DIAGNOSIS — N2581 Secondary hyperparathyroidism of renal origin: Secondary | ICD-10-CM | POA: Diagnosis not present

## 2022-06-07 DIAGNOSIS — N186 End stage renal disease: Secondary | ICD-10-CM | POA: Diagnosis not present

## 2022-06-07 DIAGNOSIS — Z992 Dependence on renal dialysis: Secondary | ICD-10-CM | POA: Diagnosis not present

## 2022-06-07 DIAGNOSIS — D689 Coagulation defect, unspecified: Secondary | ICD-10-CM | POA: Diagnosis not present

## 2022-06-10 DIAGNOSIS — N2581 Secondary hyperparathyroidism of renal origin: Secondary | ICD-10-CM | POA: Diagnosis not present

## 2022-06-10 DIAGNOSIS — N186 End stage renal disease: Secondary | ICD-10-CM | POA: Diagnosis not present

## 2022-06-10 DIAGNOSIS — D689 Coagulation defect, unspecified: Secondary | ICD-10-CM | POA: Diagnosis not present

## 2022-06-10 DIAGNOSIS — Z992 Dependence on renal dialysis: Secondary | ICD-10-CM | POA: Diagnosis not present

## 2022-06-12 DIAGNOSIS — N186 End stage renal disease: Secondary | ICD-10-CM | POA: Diagnosis not present

## 2022-06-12 DIAGNOSIS — D689 Coagulation defect, unspecified: Secondary | ICD-10-CM | POA: Diagnosis not present

## 2022-06-12 DIAGNOSIS — Z992 Dependence on renal dialysis: Secondary | ICD-10-CM | POA: Diagnosis not present

## 2022-06-12 DIAGNOSIS — N2581 Secondary hyperparathyroidism of renal origin: Secondary | ICD-10-CM | POA: Diagnosis not present

## 2022-06-14 DIAGNOSIS — D689 Coagulation defect, unspecified: Secondary | ICD-10-CM | POA: Diagnosis not present

## 2022-06-14 DIAGNOSIS — N186 End stage renal disease: Secondary | ICD-10-CM | POA: Diagnosis not present

## 2022-06-14 DIAGNOSIS — Z992 Dependence on renal dialysis: Secondary | ICD-10-CM | POA: Diagnosis not present

## 2022-06-14 DIAGNOSIS — N2581 Secondary hyperparathyroidism of renal origin: Secondary | ICD-10-CM | POA: Diagnosis not present

## 2022-06-17 DIAGNOSIS — D689 Coagulation defect, unspecified: Secondary | ICD-10-CM | POA: Diagnosis not present

## 2022-06-17 DIAGNOSIS — N186 End stage renal disease: Secondary | ICD-10-CM | POA: Diagnosis not present

## 2022-06-17 DIAGNOSIS — Z992 Dependence on renal dialysis: Secondary | ICD-10-CM | POA: Diagnosis not present

## 2022-06-17 DIAGNOSIS — N2581 Secondary hyperparathyroidism of renal origin: Secondary | ICD-10-CM | POA: Diagnosis not present

## 2022-06-18 DIAGNOSIS — Z79899 Other long term (current) drug therapy: Secondary | ICD-10-CM | POA: Diagnosis not present

## 2022-06-18 DIAGNOSIS — E1129 Type 2 diabetes mellitus with other diabetic kidney complication: Secondary | ICD-10-CM | POA: Diagnosis not present

## 2022-06-18 DIAGNOSIS — N185 Chronic kidney disease, stage 5: Secondary | ICD-10-CM | POA: Diagnosis not present

## 2022-06-18 DIAGNOSIS — I129 Hypertensive chronic kidney disease with stage 1 through stage 4 chronic kidney disease, or unspecified chronic kidney disease: Secondary | ICD-10-CM | POA: Diagnosis not present

## 2022-06-18 DIAGNOSIS — D692 Other nonthrombocytopenic purpura: Secondary | ICD-10-CM | POA: Diagnosis not present

## 2022-06-18 DIAGNOSIS — Z0000 Encounter for general adult medical examination without abnormal findings: Secondary | ICD-10-CM | POA: Diagnosis not present

## 2022-06-18 DIAGNOSIS — N2581 Secondary hyperparathyroidism of renal origin: Secondary | ICD-10-CM | POA: Diagnosis not present

## 2022-06-18 DIAGNOSIS — E7800 Pure hypercholesterolemia, unspecified: Secondary | ICD-10-CM | POA: Diagnosis not present

## 2022-06-18 DIAGNOSIS — I770 Arteriovenous fistula, acquired: Secondary | ICD-10-CM | POA: Diagnosis not present

## 2022-06-18 DIAGNOSIS — G4733 Obstructive sleep apnea (adult) (pediatric): Secondary | ICD-10-CM | POA: Diagnosis not present

## 2022-06-19 DIAGNOSIS — Z992 Dependence on renal dialysis: Secondary | ICD-10-CM | POA: Diagnosis not present

## 2022-06-19 DIAGNOSIS — N2581 Secondary hyperparathyroidism of renal origin: Secondary | ICD-10-CM | POA: Diagnosis not present

## 2022-06-19 DIAGNOSIS — D689 Coagulation defect, unspecified: Secondary | ICD-10-CM | POA: Diagnosis not present

## 2022-06-19 DIAGNOSIS — N186 End stage renal disease: Secondary | ICD-10-CM | POA: Diagnosis not present

## 2022-06-21 DIAGNOSIS — N186 End stage renal disease: Secondary | ICD-10-CM | POA: Diagnosis not present

## 2022-06-21 DIAGNOSIS — D689 Coagulation defect, unspecified: Secondary | ICD-10-CM | POA: Diagnosis not present

## 2022-06-21 DIAGNOSIS — N2581 Secondary hyperparathyroidism of renal origin: Secondary | ICD-10-CM | POA: Diagnosis not present

## 2022-06-21 DIAGNOSIS — Z992 Dependence on renal dialysis: Secondary | ICD-10-CM | POA: Diagnosis not present

## 2022-06-24 DIAGNOSIS — D689 Coagulation defect, unspecified: Secondary | ICD-10-CM | POA: Diagnosis not present

## 2022-06-24 DIAGNOSIS — Z992 Dependence on renal dialysis: Secondary | ICD-10-CM | POA: Diagnosis not present

## 2022-06-24 DIAGNOSIS — N2581 Secondary hyperparathyroidism of renal origin: Secondary | ICD-10-CM | POA: Diagnosis not present

## 2022-06-24 DIAGNOSIS — N186 End stage renal disease: Secondary | ICD-10-CM | POA: Diagnosis not present

## 2022-06-26 DIAGNOSIS — N2581 Secondary hyperparathyroidism of renal origin: Secondary | ICD-10-CM | POA: Diagnosis not present

## 2022-06-26 DIAGNOSIS — Z992 Dependence on renal dialysis: Secondary | ICD-10-CM | POA: Diagnosis not present

## 2022-06-26 DIAGNOSIS — N186 End stage renal disease: Secondary | ICD-10-CM | POA: Diagnosis not present

## 2022-06-26 DIAGNOSIS — D689 Coagulation defect, unspecified: Secondary | ICD-10-CM | POA: Diagnosis not present

## 2022-06-28 DIAGNOSIS — D689 Coagulation defect, unspecified: Secondary | ICD-10-CM | POA: Diagnosis not present

## 2022-06-28 DIAGNOSIS — N186 End stage renal disease: Secondary | ICD-10-CM | POA: Diagnosis not present

## 2022-06-28 DIAGNOSIS — N2581 Secondary hyperparathyroidism of renal origin: Secondary | ICD-10-CM | POA: Diagnosis not present

## 2022-06-28 DIAGNOSIS — Z992 Dependence on renal dialysis: Secondary | ICD-10-CM | POA: Diagnosis not present

## 2022-06-29 DIAGNOSIS — E1129 Type 2 diabetes mellitus with other diabetic kidney complication: Secondary | ICD-10-CM | POA: Diagnosis not present

## 2022-06-29 DIAGNOSIS — Z992 Dependence on renal dialysis: Secondary | ICD-10-CM | POA: Diagnosis not present

## 2022-06-29 DIAGNOSIS — N186 End stage renal disease: Secondary | ICD-10-CM | POA: Diagnosis not present

## 2022-07-01 DIAGNOSIS — Z992 Dependence on renal dialysis: Secondary | ICD-10-CM | POA: Diagnosis not present

## 2022-07-01 DIAGNOSIS — N186 End stage renal disease: Secondary | ICD-10-CM | POA: Diagnosis not present

## 2022-07-01 DIAGNOSIS — Z23 Encounter for immunization: Secondary | ICD-10-CM | POA: Diagnosis not present

## 2022-07-01 DIAGNOSIS — N2581 Secondary hyperparathyroidism of renal origin: Secondary | ICD-10-CM | POA: Diagnosis not present

## 2022-07-01 DIAGNOSIS — D689 Coagulation defect, unspecified: Secondary | ICD-10-CM | POA: Diagnosis not present

## 2022-07-01 DIAGNOSIS — T782XXA Anaphylactic shock, unspecified, initial encounter: Secondary | ICD-10-CM | POA: Diagnosis not present

## 2022-07-03 DIAGNOSIS — N2581 Secondary hyperparathyroidism of renal origin: Secondary | ICD-10-CM | POA: Diagnosis not present

## 2022-07-03 DIAGNOSIS — D689 Coagulation defect, unspecified: Secondary | ICD-10-CM | POA: Diagnosis not present

## 2022-07-03 DIAGNOSIS — T782XXA Anaphylactic shock, unspecified, initial encounter: Secondary | ICD-10-CM | POA: Diagnosis not present

## 2022-07-03 DIAGNOSIS — Z23 Encounter for immunization: Secondary | ICD-10-CM | POA: Diagnosis not present

## 2022-07-03 DIAGNOSIS — Z992 Dependence on renal dialysis: Secondary | ICD-10-CM | POA: Diagnosis not present

## 2022-07-03 DIAGNOSIS — N186 End stage renal disease: Secondary | ICD-10-CM | POA: Diagnosis not present

## 2022-07-05 DIAGNOSIS — N186 End stage renal disease: Secondary | ICD-10-CM | POA: Diagnosis not present

## 2022-07-05 DIAGNOSIS — N2581 Secondary hyperparathyroidism of renal origin: Secondary | ICD-10-CM | POA: Diagnosis not present

## 2022-07-05 DIAGNOSIS — D689 Coagulation defect, unspecified: Secondary | ICD-10-CM | POA: Diagnosis not present

## 2022-07-05 DIAGNOSIS — Z992 Dependence on renal dialysis: Secondary | ICD-10-CM | POA: Diagnosis not present

## 2022-07-05 DIAGNOSIS — T782XXA Anaphylactic shock, unspecified, initial encounter: Secondary | ICD-10-CM | POA: Diagnosis not present

## 2022-07-05 DIAGNOSIS — Z23 Encounter for immunization: Secondary | ICD-10-CM | POA: Diagnosis not present

## 2022-07-08 DIAGNOSIS — T782XXA Anaphylactic shock, unspecified, initial encounter: Secondary | ICD-10-CM | POA: Diagnosis not present

## 2022-07-08 DIAGNOSIS — N186 End stage renal disease: Secondary | ICD-10-CM | POA: Diagnosis not present

## 2022-07-08 DIAGNOSIS — D689 Coagulation defect, unspecified: Secondary | ICD-10-CM | POA: Diagnosis not present

## 2022-07-08 DIAGNOSIS — Z23 Encounter for immunization: Secondary | ICD-10-CM | POA: Diagnosis not present

## 2022-07-08 DIAGNOSIS — N2581 Secondary hyperparathyroidism of renal origin: Secondary | ICD-10-CM | POA: Diagnosis not present

## 2022-07-08 DIAGNOSIS — Z992 Dependence on renal dialysis: Secondary | ICD-10-CM | POA: Diagnosis not present

## 2022-07-10 DIAGNOSIS — Z23 Encounter for immunization: Secondary | ICD-10-CM | POA: Diagnosis not present

## 2022-07-10 DIAGNOSIS — T782XXA Anaphylactic shock, unspecified, initial encounter: Secondary | ICD-10-CM | POA: Diagnosis not present

## 2022-07-10 DIAGNOSIS — D689 Coagulation defect, unspecified: Secondary | ICD-10-CM | POA: Diagnosis not present

## 2022-07-10 DIAGNOSIS — N186 End stage renal disease: Secondary | ICD-10-CM | POA: Diagnosis not present

## 2022-07-10 DIAGNOSIS — Z992 Dependence on renal dialysis: Secondary | ICD-10-CM | POA: Diagnosis not present

## 2022-07-10 DIAGNOSIS — N2581 Secondary hyperparathyroidism of renal origin: Secondary | ICD-10-CM | POA: Diagnosis not present

## 2022-07-12 DIAGNOSIS — T782XXA Anaphylactic shock, unspecified, initial encounter: Secondary | ICD-10-CM | POA: Diagnosis not present

## 2022-07-12 DIAGNOSIS — N2581 Secondary hyperparathyroidism of renal origin: Secondary | ICD-10-CM | POA: Diagnosis not present

## 2022-07-12 DIAGNOSIS — D689 Coagulation defect, unspecified: Secondary | ICD-10-CM | POA: Diagnosis not present

## 2022-07-12 DIAGNOSIS — Z992 Dependence on renal dialysis: Secondary | ICD-10-CM | POA: Diagnosis not present

## 2022-07-12 DIAGNOSIS — N186 End stage renal disease: Secondary | ICD-10-CM | POA: Diagnosis not present

## 2022-07-12 DIAGNOSIS — Z23 Encounter for immunization: Secondary | ICD-10-CM | POA: Diagnosis not present

## 2022-07-15 DIAGNOSIS — N2581 Secondary hyperparathyroidism of renal origin: Secondary | ICD-10-CM | POA: Diagnosis not present

## 2022-07-15 DIAGNOSIS — Z992 Dependence on renal dialysis: Secondary | ICD-10-CM | POA: Diagnosis not present

## 2022-07-15 DIAGNOSIS — T782XXA Anaphylactic shock, unspecified, initial encounter: Secondary | ICD-10-CM | POA: Diagnosis not present

## 2022-07-15 DIAGNOSIS — Z23 Encounter for immunization: Secondary | ICD-10-CM | POA: Diagnosis not present

## 2022-07-15 DIAGNOSIS — D689 Coagulation defect, unspecified: Secondary | ICD-10-CM | POA: Diagnosis not present

## 2022-07-15 DIAGNOSIS — N186 End stage renal disease: Secondary | ICD-10-CM | POA: Diagnosis not present

## 2022-07-17 DIAGNOSIS — D689 Coagulation defect, unspecified: Secondary | ICD-10-CM | POA: Diagnosis not present

## 2022-07-17 DIAGNOSIS — T782XXA Anaphylactic shock, unspecified, initial encounter: Secondary | ICD-10-CM | POA: Diagnosis not present

## 2022-07-17 DIAGNOSIS — Z992 Dependence on renal dialysis: Secondary | ICD-10-CM | POA: Diagnosis not present

## 2022-07-17 DIAGNOSIS — N2581 Secondary hyperparathyroidism of renal origin: Secondary | ICD-10-CM | POA: Diagnosis not present

## 2022-07-17 DIAGNOSIS — Z23 Encounter for immunization: Secondary | ICD-10-CM | POA: Diagnosis not present

## 2022-07-17 DIAGNOSIS — N186 End stage renal disease: Secondary | ICD-10-CM | POA: Diagnosis not present

## 2022-07-19 DIAGNOSIS — D689 Coagulation defect, unspecified: Secondary | ICD-10-CM | POA: Diagnosis not present

## 2022-07-19 DIAGNOSIS — Z992 Dependence on renal dialysis: Secondary | ICD-10-CM | POA: Diagnosis not present

## 2022-07-19 DIAGNOSIS — N2581 Secondary hyperparathyroidism of renal origin: Secondary | ICD-10-CM | POA: Diagnosis not present

## 2022-07-19 DIAGNOSIS — Z23 Encounter for immunization: Secondary | ICD-10-CM | POA: Diagnosis not present

## 2022-07-19 DIAGNOSIS — N186 End stage renal disease: Secondary | ICD-10-CM | POA: Diagnosis not present

## 2022-07-19 DIAGNOSIS — T782XXA Anaphylactic shock, unspecified, initial encounter: Secondary | ICD-10-CM | POA: Diagnosis not present

## 2022-07-22 DIAGNOSIS — Z23 Encounter for immunization: Secondary | ICD-10-CM | POA: Diagnosis not present

## 2022-07-22 DIAGNOSIS — N186 End stage renal disease: Secondary | ICD-10-CM | POA: Diagnosis not present

## 2022-07-22 DIAGNOSIS — Z992 Dependence on renal dialysis: Secondary | ICD-10-CM | POA: Diagnosis not present

## 2022-07-22 DIAGNOSIS — T782XXA Anaphylactic shock, unspecified, initial encounter: Secondary | ICD-10-CM | POA: Diagnosis not present

## 2022-07-22 DIAGNOSIS — D689 Coagulation defect, unspecified: Secondary | ICD-10-CM | POA: Diagnosis not present

## 2022-07-22 DIAGNOSIS — N2581 Secondary hyperparathyroidism of renal origin: Secondary | ICD-10-CM | POA: Diagnosis not present

## 2022-07-24 DIAGNOSIS — D689 Coagulation defect, unspecified: Secondary | ICD-10-CM | POA: Diagnosis not present

## 2022-07-24 DIAGNOSIS — N2581 Secondary hyperparathyroidism of renal origin: Secondary | ICD-10-CM | POA: Diagnosis not present

## 2022-07-24 DIAGNOSIS — Z992 Dependence on renal dialysis: Secondary | ICD-10-CM | POA: Diagnosis not present

## 2022-07-24 DIAGNOSIS — T782XXA Anaphylactic shock, unspecified, initial encounter: Secondary | ICD-10-CM | POA: Diagnosis not present

## 2022-07-24 DIAGNOSIS — Z23 Encounter for immunization: Secondary | ICD-10-CM | POA: Diagnosis not present

## 2022-07-24 DIAGNOSIS — N186 End stage renal disease: Secondary | ICD-10-CM | POA: Diagnosis not present

## 2022-07-26 DIAGNOSIS — T782XXA Anaphylactic shock, unspecified, initial encounter: Secondary | ICD-10-CM | POA: Diagnosis not present

## 2022-07-26 DIAGNOSIS — D689 Coagulation defect, unspecified: Secondary | ICD-10-CM | POA: Diagnosis not present

## 2022-07-26 DIAGNOSIS — Z992 Dependence on renal dialysis: Secondary | ICD-10-CM | POA: Diagnosis not present

## 2022-07-26 DIAGNOSIS — N186 End stage renal disease: Secondary | ICD-10-CM | POA: Diagnosis not present

## 2022-07-26 DIAGNOSIS — Z23 Encounter for immunization: Secondary | ICD-10-CM | POA: Diagnosis not present

## 2022-07-26 DIAGNOSIS — N2581 Secondary hyperparathyroidism of renal origin: Secondary | ICD-10-CM | POA: Diagnosis not present

## 2022-07-29 DIAGNOSIS — N186 End stage renal disease: Secondary | ICD-10-CM | POA: Diagnosis not present

## 2022-07-29 DIAGNOSIS — Z23 Encounter for immunization: Secondary | ICD-10-CM | POA: Diagnosis not present

## 2022-07-29 DIAGNOSIS — D689 Coagulation defect, unspecified: Secondary | ICD-10-CM | POA: Diagnosis not present

## 2022-07-29 DIAGNOSIS — Z992 Dependence on renal dialysis: Secondary | ICD-10-CM | POA: Diagnosis not present

## 2022-07-29 DIAGNOSIS — T782XXA Anaphylactic shock, unspecified, initial encounter: Secondary | ICD-10-CM | POA: Diagnosis not present

## 2022-07-29 DIAGNOSIS — N2581 Secondary hyperparathyroidism of renal origin: Secondary | ICD-10-CM | POA: Diagnosis not present

## 2022-07-30 DIAGNOSIS — E119 Type 2 diabetes mellitus without complications: Secondary | ICD-10-CM | POA: Diagnosis not present

## 2022-07-30 DIAGNOSIS — N186 End stage renal disease: Secondary | ICD-10-CM | POA: Diagnosis not present

## 2022-07-30 DIAGNOSIS — H11442 Conjunctival cysts, left eye: Secondary | ICD-10-CM | POA: Diagnosis not present

## 2022-07-30 DIAGNOSIS — H43811 Vitreous degeneration, right eye: Secondary | ICD-10-CM | POA: Diagnosis not present

## 2022-07-30 DIAGNOSIS — Z992 Dependence on renal dialysis: Secondary | ICD-10-CM | POA: Diagnosis not present

## 2022-07-30 DIAGNOSIS — H25812 Combined forms of age-related cataract, left eye: Secondary | ICD-10-CM | POA: Diagnosis not present

## 2022-07-30 DIAGNOSIS — E1129 Type 2 diabetes mellitus with other diabetic kidney complication: Secondary | ICD-10-CM | POA: Diagnosis not present

## 2022-07-31 DIAGNOSIS — D689 Coagulation defect, unspecified: Secondary | ICD-10-CM | POA: Diagnosis not present

## 2022-07-31 DIAGNOSIS — N186 End stage renal disease: Secondary | ICD-10-CM | POA: Diagnosis not present

## 2022-07-31 DIAGNOSIS — N2581 Secondary hyperparathyroidism of renal origin: Secondary | ICD-10-CM | POA: Diagnosis not present

## 2022-07-31 DIAGNOSIS — Z992 Dependence on renal dialysis: Secondary | ICD-10-CM | POA: Diagnosis not present

## 2022-07-31 DIAGNOSIS — R519 Headache, unspecified: Secondary | ICD-10-CM | POA: Diagnosis not present

## 2022-07-31 DIAGNOSIS — T782XXA Anaphylactic shock, unspecified, initial encounter: Secondary | ICD-10-CM | POA: Diagnosis not present

## 2022-08-02 DIAGNOSIS — Z992 Dependence on renal dialysis: Secondary | ICD-10-CM | POA: Diagnosis not present

## 2022-08-02 DIAGNOSIS — N186 End stage renal disease: Secondary | ICD-10-CM | POA: Diagnosis not present

## 2022-08-02 DIAGNOSIS — D689 Coagulation defect, unspecified: Secondary | ICD-10-CM | POA: Diagnosis not present

## 2022-08-02 DIAGNOSIS — T782XXA Anaphylactic shock, unspecified, initial encounter: Secondary | ICD-10-CM | POA: Diagnosis not present

## 2022-08-02 DIAGNOSIS — N2581 Secondary hyperparathyroidism of renal origin: Secondary | ICD-10-CM | POA: Diagnosis not present

## 2022-08-02 DIAGNOSIS — R519 Headache, unspecified: Secondary | ICD-10-CM | POA: Diagnosis not present

## 2022-08-05 DIAGNOSIS — N2581 Secondary hyperparathyroidism of renal origin: Secondary | ICD-10-CM | POA: Diagnosis not present

## 2022-08-05 DIAGNOSIS — R519 Headache, unspecified: Secondary | ICD-10-CM | POA: Diagnosis not present

## 2022-08-05 DIAGNOSIS — D689 Coagulation defect, unspecified: Secondary | ICD-10-CM | POA: Diagnosis not present

## 2022-08-05 DIAGNOSIS — T782XXA Anaphylactic shock, unspecified, initial encounter: Secondary | ICD-10-CM | POA: Diagnosis not present

## 2022-08-05 DIAGNOSIS — N186 End stage renal disease: Secondary | ICD-10-CM | POA: Diagnosis not present

## 2022-08-05 DIAGNOSIS — Z992 Dependence on renal dialysis: Secondary | ICD-10-CM | POA: Diagnosis not present

## 2022-08-07 DIAGNOSIS — N2581 Secondary hyperparathyroidism of renal origin: Secondary | ICD-10-CM | POA: Diagnosis not present

## 2022-08-07 DIAGNOSIS — N186 End stage renal disease: Secondary | ICD-10-CM | POA: Diagnosis not present

## 2022-08-07 DIAGNOSIS — R519 Headache, unspecified: Secondary | ICD-10-CM | POA: Diagnosis not present

## 2022-08-07 DIAGNOSIS — Z992 Dependence on renal dialysis: Secondary | ICD-10-CM | POA: Diagnosis not present

## 2022-08-07 DIAGNOSIS — T782XXA Anaphylactic shock, unspecified, initial encounter: Secondary | ICD-10-CM | POA: Diagnosis not present

## 2022-08-07 DIAGNOSIS — D689 Coagulation defect, unspecified: Secondary | ICD-10-CM | POA: Diagnosis not present

## 2022-08-09 DIAGNOSIS — N2581 Secondary hyperparathyroidism of renal origin: Secondary | ICD-10-CM | POA: Diagnosis not present

## 2022-08-09 DIAGNOSIS — R519 Headache, unspecified: Secondary | ICD-10-CM | POA: Diagnosis not present

## 2022-08-09 DIAGNOSIS — D689 Coagulation defect, unspecified: Secondary | ICD-10-CM | POA: Diagnosis not present

## 2022-08-09 DIAGNOSIS — T782XXA Anaphylactic shock, unspecified, initial encounter: Secondary | ICD-10-CM | POA: Diagnosis not present

## 2022-08-09 DIAGNOSIS — N186 End stage renal disease: Secondary | ICD-10-CM | POA: Diagnosis not present

## 2022-08-09 DIAGNOSIS — Z992 Dependence on renal dialysis: Secondary | ICD-10-CM | POA: Diagnosis not present

## 2022-08-12 DIAGNOSIS — Z992 Dependence on renal dialysis: Secondary | ICD-10-CM | POA: Diagnosis not present

## 2022-08-12 DIAGNOSIS — R519 Headache, unspecified: Secondary | ICD-10-CM | POA: Diagnosis not present

## 2022-08-12 DIAGNOSIS — T782XXA Anaphylactic shock, unspecified, initial encounter: Secondary | ICD-10-CM | POA: Diagnosis not present

## 2022-08-12 DIAGNOSIS — N2581 Secondary hyperparathyroidism of renal origin: Secondary | ICD-10-CM | POA: Diagnosis not present

## 2022-08-12 DIAGNOSIS — D689 Coagulation defect, unspecified: Secondary | ICD-10-CM | POA: Diagnosis not present

## 2022-08-12 DIAGNOSIS — N186 End stage renal disease: Secondary | ICD-10-CM | POA: Diagnosis not present

## 2022-08-14 DIAGNOSIS — D689 Coagulation defect, unspecified: Secondary | ICD-10-CM | POA: Diagnosis not present

## 2022-08-14 DIAGNOSIS — N186 End stage renal disease: Secondary | ICD-10-CM | POA: Diagnosis not present

## 2022-08-14 DIAGNOSIS — N2581 Secondary hyperparathyroidism of renal origin: Secondary | ICD-10-CM | POA: Diagnosis not present

## 2022-08-14 DIAGNOSIS — R519 Headache, unspecified: Secondary | ICD-10-CM | POA: Diagnosis not present

## 2022-08-14 DIAGNOSIS — T782XXA Anaphylactic shock, unspecified, initial encounter: Secondary | ICD-10-CM | POA: Diagnosis not present

## 2022-08-14 DIAGNOSIS — Z992 Dependence on renal dialysis: Secondary | ICD-10-CM | POA: Diagnosis not present

## 2022-08-16 DIAGNOSIS — N186 End stage renal disease: Secondary | ICD-10-CM | POA: Diagnosis not present

## 2022-08-16 DIAGNOSIS — T782XXA Anaphylactic shock, unspecified, initial encounter: Secondary | ICD-10-CM | POA: Diagnosis not present

## 2022-08-16 DIAGNOSIS — D689 Coagulation defect, unspecified: Secondary | ICD-10-CM | POA: Diagnosis not present

## 2022-08-16 DIAGNOSIS — Z992 Dependence on renal dialysis: Secondary | ICD-10-CM | POA: Diagnosis not present

## 2022-08-16 DIAGNOSIS — N2581 Secondary hyperparathyroidism of renal origin: Secondary | ICD-10-CM | POA: Diagnosis not present

## 2022-08-16 DIAGNOSIS — R519 Headache, unspecified: Secondary | ICD-10-CM | POA: Diagnosis not present

## 2022-08-18 DIAGNOSIS — N2581 Secondary hyperparathyroidism of renal origin: Secondary | ICD-10-CM | POA: Diagnosis not present

## 2022-08-18 DIAGNOSIS — N186 End stage renal disease: Secondary | ICD-10-CM | POA: Diagnosis not present

## 2022-08-18 DIAGNOSIS — T782XXA Anaphylactic shock, unspecified, initial encounter: Secondary | ICD-10-CM | POA: Diagnosis not present

## 2022-08-18 DIAGNOSIS — Z992 Dependence on renal dialysis: Secondary | ICD-10-CM | POA: Diagnosis not present

## 2022-08-18 DIAGNOSIS — R519 Headache, unspecified: Secondary | ICD-10-CM | POA: Diagnosis not present

## 2022-08-18 DIAGNOSIS — D689 Coagulation defect, unspecified: Secondary | ICD-10-CM | POA: Diagnosis not present

## 2022-08-20 DIAGNOSIS — Z992 Dependence on renal dialysis: Secondary | ICD-10-CM | POA: Diagnosis not present

## 2022-08-20 DIAGNOSIS — T782XXA Anaphylactic shock, unspecified, initial encounter: Secondary | ICD-10-CM | POA: Diagnosis not present

## 2022-08-20 DIAGNOSIS — R519 Headache, unspecified: Secondary | ICD-10-CM | POA: Diagnosis not present

## 2022-08-20 DIAGNOSIS — N186 End stage renal disease: Secondary | ICD-10-CM | POA: Diagnosis not present

## 2022-08-20 DIAGNOSIS — N2581 Secondary hyperparathyroidism of renal origin: Secondary | ICD-10-CM | POA: Diagnosis not present

## 2022-08-20 DIAGNOSIS — D689 Coagulation defect, unspecified: Secondary | ICD-10-CM | POA: Diagnosis not present

## 2022-08-23 DIAGNOSIS — T782XXA Anaphylactic shock, unspecified, initial encounter: Secondary | ICD-10-CM | POA: Diagnosis not present

## 2022-08-23 DIAGNOSIS — D689 Coagulation defect, unspecified: Secondary | ICD-10-CM | POA: Diagnosis not present

## 2022-08-23 DIAGNOSIS — Z992 Dependence on renal dialysis: Secondary | ICD-10-CM | POA: Diagnosis not present

## 2022-08-23 DIAGNOSIS — N2581 Secondary hyperparathyroidism of renal origin: Secondary | ICD-10-CM | POA: Diagnosis not present

## 2022-08-23 DIAGNOSIS — R519 Headache, unspecified: Secondary | ICD-10-CM | POA: Diagnosis not present

## 2022-08-23 DIAGNOSIS — N186 End stage renal disease: Secondary | ICD-10-CM | POA: Diagnosis not present

## 2022-08-26 DIAGNOSIS — T782XXA Anaphylactic shock, unspecified, initial encounter: Secondary | ICD-10-CM | POA: Diagnosis not present

## 2022-08-26 DIAGNOSIS — Z992 Dependence on renal dialysis: Secondary | ICD-10-CM | POA: Diagnosis not present

## 2022-08-26 DIAGNOSIS — D689 Coagulation defect, unspecified: Secondary | ICD-10-CM | POA: Diagnosis not present

## 2022-08-26 DIAGNOSIS — N2581 Secondary hyperparathyroidism of renal origin: Secondary | ICD-10-CM | POA: Diagnosis not present

## 2022-08-26 DIAGNOSIS — R519 Headache, unspecified: Secondary | ICD-10-CM | POA: Diagnosis not present

## 2022-08-26 DIAGNOSIS — N186 End stage renal disease: Secondary | ICD-10-CM | POA: Diagnosis not present

## 2022-08-28 DIAGNOSIS — Z992 Dependence on renal dialysis: Secondary | ICD-10-CM | POA: Diagnosis not present

## 2022-08-28 DIAGNOSIS — T782XXA Anaphylactic shock, unspecified, initial encounter: Secondary | ICD-10-CM | POA: Diagnosis not present

## 2022-08-28 DIAGNOSIS — D689 Coagulation defect, unspecified: Secondary | ICD-10-CM | POA: Diagnosis not present

## 2022-08-28 DIAGNOSIS — N2581 Secondary hyperparathyroidism of renal origin: Secondary | ICD-10-CM | POA: Diagnosis not present

## 2022-08-28 DIAGNOSIS — N186 End stage renal disease: Secondary | ICD-10-CM | POA: Diagnosis not present

## 2022-08-28 DIAGNOSIS — R519 Headache, unspecified: Secondary | ICD-10-CM | POA: Diagnosis not present

## 2022-08-29 DIAGNOSIS — E1129 Type 2 diabetes mellitus with other diabetic kidney complication: Secondary | ICD-10-CM | POA: Diagnosis not present

## 2022-08-29 DIAGNOSIS — N186 End stage renal disease: Secondary | ICD-10-CM | POA: Diagnosis not present

## 2022-08-29 DIAGNOSIS — Z992 Dependence on renal dialysis: Secondary | ICD-10-CM | POA: Diagnosis not present

## 2022-08-30 DIAGNOSIS — Z23 Encounter for immunization: Secondary | ICD-10-CM | POA: Diagnosis not present

## 2022-08-30 DIAGNOSIS — Z992 Dependence on renal dialysis: Secondary | ICD-10-CM | POA: Diagnosis not present

## 2022-08-30 DIAGNOSIS — N2581 Secondary hyperparathyroidism of renal origin: Secondary | ICD-10-CM | POA: Diagnosis not present

## 2022-08-30 DIAGNOSIS — D689 Coagulation defect, unspecified: Secondary | ICD-10-CM | POA: Diagnosis not present

## 2022-08-30 DIAGNOSIS — N186 End stage renal disease: Secondary | ICD-10-CM | POA: Diagnosis not present

## 2022-08-30 DIAGNOSIS — T782XXA Anaphylactic shock, unspecified, initial encounter: Secondary | ICD-10-CM | POA: Diagnosis not present

## 2022-09-02 DIAGNOSIS — N2581 Secondary hyperparathyroidism of renal origin: Secondary | ICD-10-CM | POA: Diagnosis not present

## 2022-09-02 DIAGNOSIS — Z23 Encounter for immunization: Secondary | ICD-10-CM | POA: Diagnosis not present

## 2022-09-02 DIAGNOSIS — D689 Coagulation defect, unspecified: Secondary | ICD-10-CM | POA: Diagnosis not present

## 2022-09-02 DIAGNOSIS — N186 End stage renal disease: Secondary | ICD-10-CM | POA: Diagnosis not present

## 2022-09-02 DIAGNOSIS — Z992 Dependence on renal dialysis: Secondary | ICD-10-CM | POA: Diagnosis not present

## 2022-09-02 DIAGNOSIS — T782XXA Anaphylactic shock, unspecified, initial encounter: Secondary | ICD-10-CM | POA: Diagnosis not present

## 2022-09-04 DIAGNOSIS — N186 End stage renal disease: Secondary | ICD-10-CM | POA: Diagnosis not present

## 2022-09-04 DIAGNOSIS — D689 Coagulation defect, unspecified: Secondary | ICD-10-CM | POA: Diagnosis not present

## 2022-09-04 DIAGNOSIS — N2581 Secondary hyperparathyroidism of renal origin: Secondary | ICD-10-CM | POA: Diagnosis not present

## 2022-09-04 DIAGNOSIS — Z992 Dependence on renal dialysis: Secondary | ICD-10-CM | POA: Diagnosis not present

## 2022-09-04 DIAGNOSIS — Z23 Encounter for immunization: Secondary | ICD-10-CM | POA: Diagnosis not present

## 2022-09-04 DIAGNOSIS — T782XXA Anaphylactic shock, unspecified, initial encounter: Secondary | ICD-10-CM | POA: Diagnosis not present

## 2022-09-06 DIAGNOSIS — D689 Coagulation defect, unspecified: Secondary | ICD-10-CM | POA: Diagnosis not present

## 2022-09-06 DIAGNOSIS — T782XXA Anaphylactic shock, unspecified, initial encounter: Secondary | ICD-10-CM | POA: Diagnosis not present

## 2022-09-06 DIAGNOSIS — N2581 Secondary hyperparathyroidism of renal origin: Secondary | ICD-10-CM | POA: Diagnosis not present

## 2022-09-06 DIAGNOSIS — Z23 Encounter for immunization: Secondary | ICD-10-CM | POA: Diagnosis not present

## 2022-09-06 DIAGNOSIS — Z992 Dependence on renal dialysis: Secondary | ICD-10-CM | POA: Diagnosis not present

## 2022-09-06 DIAGNOSIS — N186 End stage renal disease: Secondary | ICD-10-CM | POA: Diagnosis not present

## 2022-09-09 DIAGNOSIS — Z992 Dependence on renal dialysis: Secondary | ICD-10-CM | POA: Diagnosis not present

## 2022-09-09 DIAGNOSIS — D689 Coagulation defect, unspecified: Secondary | ICD-10-CM | POA: Diagnosis not present

## 2022-09-09 DIAGNOSIS — T782XXA Anaphylactic shock, unspecified, initial encounter: Secondary | ICD-10-CM | POA: Diagnosis not present

## 2022-09-09 DIAGNOSIS — N2581 Secondary hyperparathyroidism of renal origin: Secondary | ICD-10-CM | POA: Diagnosis not present

## 2022-09-09 DIAGNOSIS — N186 End stage renal disease: Secondary | ICD-10-CM | POA: Diagnosis not present

## 2022-09-09 DIAGNOSIS — Z23 Encounter for immunization: Secondary | ICD-10-CM | POA: Diagnosis not present

## 2022-09-10 DIAGNOSIS — H25812 Combined forms of age-related cataract, left eye: Secondary | ICD-10-CM | POA: Diagnosis not present

## 2022-09-10 DIAGNOSIS — H268 Other specified cataract: Secondary | ICD-10-CM | POA: Diagnosis not present

## 2022-09-11 DIAGNOSIS — N186 End stage renal disease: Secondary | ICD-10-CM | POA: Diagnosis not present

## 2022-09-11 DIAGNOSIS — T782XXA Anaphylactic shock, unspecified, initial encounter: Secondary | ICD-10-CM | POA: Diagnosis not present

## 2022-09-11 DIAGNOSIS — Z23 Encounter for immunization: Secondary | ICD-10-CM | POA: Diagnosis not present

## 2022-09-11 DIAGNOSIS — L089 Local infection of the skin and subcutaneous tissue, unspecified: Secondary | ICD-10-CM | POA: Diagnosis not present

## 2022-09-11 DIAGNOSIS — Z992 Dependence on renal dialysis: Secondary | ICD-10-CM | POA: Diagnosis not present

## 2022-09-11 DIAGNOSIS — D689 Coagulation defect, unspecified: Secondary | ICD-10-CM | POA: Diagnosis not present

## 2022-09-11 DIAGNOSIS — N2581 Secondary hyperparathyroidism of renal origin: Secondary | ICD-10-CM | POA: Diagnosis not present

## 2022-09-13 DIAGNOSIS — Z992 Dependence on renal dialysis: Secondary | ICD-10-CM | POA: Diagnosis not present

## 2022-09-13 DIAGNOSIS — N2581 Secondary hyperparathyroidism of renal origin: Secondary | ICD-10-CM | POA: Diagnosis not present

## 2022-09-13 DIAGNOSIS — Z23 Encounter for immunization: Secondary | ICD-10-CM | POA: Diagnosis not present

## 2022-09-13 DIAGNOSIS — T782XXA Anaphylactic shock, unspecified, initial encounter: Secondary | ICD-10-CM | POA: Diagnosis not present

## 2022-09-13 DIAGNOSIS — N186 End stage renal disease: Secondary | ICD-10-CM | POA: Diagnosis not present

## 2022-09-13 DIAGNOSIS — D689 Coagulation defect, unspecified: Secondary | ICD-10-CM | POA: Diagnosis not present

## 2022-09-16 DIAGNOSIS — Z23 Encounter for immunization: Secondary | ICD-10-CM | POA: Diagnosis not present

## 2022-09-16 DIAGNOSIS — N2581 Secondary hyperparathyroidism of renal origin: Secondary | ICD-10-CM | POA: Diagnosis not present

## 2022-09-16 DIAGNOSIS — D689 Coagulation defect, unspecified: Secondary | ICD-10-CM | POA: Diagnosis not present

## 2022-09-16 DIAGNOSIS — Z992 Dependence on renal dialysis: Secondary | ICD-10-CM | POA: Diagnosis not present

## 2022-09-16 DIAGNOSIS — N186 End stage renal disease: Secondary | ICD-10-CM | POA: Diagnosis not present

## 2022-09-16 DIAGNOSIS — T782XXA Anaphylactic shock, unspecified, initial encounter: Secondary | ICD-10-CM | POA: Diagnosis not present

## 2022-09-18 DIAGNOSIS — N186 End stage renal disease: Secondary | ICD-10-CM | POA: Diagnosis not present

## 2022-09-18 DIAGNOSIS — N2581 Secondary hyperparathyroidism of renal origin: Secondary | ICD-10-CM | POA: Diagnosis not present

## 2022-09-18 DIAGNOSIS — Z992 Dependence on renal dialysis: Secondary | ICD-10-CM | POA: Diagnosis not present

## 2022-09-18 DIAGNOSIS — T782XXA Anaphylactic shock, unspecified, initial encounter: Secondary | ICD-10-CM | POA: Diagnosis not present

## 2022-09-18 DIAGNOSIS — Z23 Encounter for immunization: Secondary | ICD-10-CM | POA: Diagnosis not present

## 2022-09-18 DIAGNOSIS — D689 Coagulation defect, unspecified: Secondary | ICD-10-CM | POA: Diagnosis not present

## 2022-09-20 DIAGNOSIS — T782XXA Anaphylactic shock, unspecified, initial encounter: Secondary | ICD-10-CM | POA: Diagnosis not present

## 2022-09-20 DIAGNOSIS — N186 End stage renal disease: Secondary | ICD-10-CM | POA: Diagnosis not present

## 2022-09-20 DIAGNOSIS — Z992 Dependence on renal dialysis: Secondary | ICD-10-CM | POA: Diagnosis not present

## 2022-09-20 DIAGNOSIS — Z23 Encounter for immunization: Secondary | ICD-10-CM | POA: Diagnosis not present

## 2022-09-20 DIAGNOSIS — N2581 Secondary hyperparathyroidism of renal origin: Secondary | ICD-10-CM | POA: Diagnosis not present

## 2022-09-20 DIAGNOSIS — D689 Coagulation defect, unspecified: Secondary | ICD-10-CM | POA: Diagnosis not present

## 2022-09-22 DIAGNOSIS — N186 End stage renal disease: Secondary | ICD-10-CM | POA: Diagnosis not present

## 2022-09-22 DIAGNOSIS — T782XXA Anaphylactic shock, unspecified, initial encounter: Secondary | ICD-10-CM | POA: Diagnosis not present

## 2022-09-22 DIAGNOSIS — D689 Coagulation defect, unspecified: Secondary | ICD-10-CM | POA: Diagnosis not present

## 2022-09-22 DIAGNOSIS — N2581 Secondary hyperparathyroidism of renal origin: Secondary | ICD-10-CM | POA: Diagnosis not present

## 2022-09-22 DIAGNOSIS — Z992 Dependence on renal dialysis: Secondary | ICD-10-CM | POA: Diagnosis not present

## 2022-09-22 DIAGNOSIS — Z23 Encounter for immunization: Secondary | ICD-10-CM | POA: Diagnosis not present

## 2022-09-25 DIAGNOSIS — N186 End stage renal disease: Secondary | ICD-10-CM | POA: Diagnosis not present

## 2022-09-25 DIAGNOSIS — Z23 Encounter for immunization: Secondary | ICD-10-CM | POA: Diagnosis not present

## 2022-09-25 DIAGNOSIS — D689 Coagulation defect, unspecified: Secondary | ICD-10-CM | POA: Diagnosis not present

## 2022-09-25 DIAGNOSIS — T782XXA Anaphylactic shock, unspecified, initial encounter: Secondary | ICD-10-CM | POA: Diagnosis not present

## 2022-09-25 DIAGNOSIS — Z992 Dependence on renal dialysis: Secondary | ICD-10-CM | POA: Diagnosis not present

## 2022-09-25 DIAGNOSIS — N2581 Secondary hyperparathyroidism of renal origin: Secondary | ICD-10-CM | POA: Diagnosis not present

## 2022-09-26 DIAGNOSIS — H268 Other specified cataract: Secondary | ICD-10-CM | POA: Diagnosis not present

## 2022-09-26 DIAGNOSIS — H25011 Cortical age-related cataract, right eye: Secondary | ICD-10-CM | POA: Diagnosis not present

## 2022-09-27 DIAGNOSIS — Z992 Dependence on renal dialysis: Secondary | ICD-10-CM | POA: Diagnosis not present

## 2022-09-27 DIAGNOSIS — D689 Coagulation defect, unspecified: Secondary | ICD-10-CM | POA: Diagnosis not present

## 2022-09-27 DIAGNOSIS — N186 End stage renal disease: Secondary | ICD-10-CM | POA: Diagnosis not present

## 2022-09-27 DIAGNOSIS — N2581 Secondary hyperparathyroidism of renal origin: Secondary | ICD-10-CM | POA: Diagnosis not present

## 2022-09-27 DIAGNOSIS — Z23 Encounter for immunization: Secondary | ICD-10-CM | POA: Diagnosis not present

## 2022-09-27 DIAGNOSIS — T782XXA Anaphylactic shock, unspecified, initial encounter: Secondary | ICD-10-CM | POA: Diagnosis not present

## 2022-09-29 DIAGNOSIS — Z23 Encounter for immunization: Secondary | ICD-10-CM | POA: Diagnosis not present

## 2022-09-29 DIAGNOSIS — N2581 Secondary hyperparathyroidism of renal origin: Secondary | ICD-10-CM | POA: Diagnosis not present

## 2022-09-29 DIAGNOSIS — Z992 Dependence on renal dialysis: Secondary | ICD-10-CM | POA: Diagnosis not present

## 2022-09-29 DIAGNOSIS — E1129 Type 2 diabetes mellitus with other diabetic kidney complication: Secondary | ICD-10-CM | POA: Diagnosis not present

## 2022-09-29 DIAGNOSIS — D689 Coagulation defect, unspecified: Secondary | ICD-10-CM | POA: Diagnosis not present

## 2022-09-29 DIAGNOSIS — N186 End stage renal disease: Secondary | ICD-10-CM | POA: Diagnosis not present

## 2022-09-29 DIAGNOSIS — T782XXA Anaphylactic shock, unspecified, initial encounter: Secondary | ICD-10-CM | POA: Diagnosis not present

## 2022-10-02 DIAGNOSIS — N2581 Secondary hyperparathyroidism of renal origin: Secondary | ICD-10-CM | POA: Diagnosis not present

## 2022-10-02 DIAGNOSIS — Z992 Dependence on renal dialysis: Secondary | ICD-10-CM | POA: Diagnosis not present

## 2022-10-02 DIAGNOSIS — D689 Coagulation defect, unspecified: Secondary | ICD-10-CM | POA: Diagnosis not present

## 2022-10-02 DIAGNOSIS — N186 End stage renal disease: Secondary | ICD-10-CM | POA: Diagnosis not present

## 2022-10-04 DIAGNOSIS — Z992 Dependence on renal dialysis: Secondary | ICD-10-CM | POA: Diagnosis not present

## 2022-10-04 DIAGNOSIS — N186 End stage renal disease: Secondary | ICD-10-CM | POA: Diagnosis not present

## 2022-10-04 DIAGNOSIS — D689 Coagulation defect, unspecified: Secondary | ICD-10-CM | POA: Diagnosis not present

## 2022-10-04 DIAGNOSIS — N2581 Secondary hyperparathyroidism of renal origin: Secondary | ICD-10-CM | POA: Diagnosis not present

## 2022-10-07 DIAGNOSIS — N2581 Secondary hyperparathyroidism of renal origin: Secondary | ICD-10-CM | POA: Diagnosis not present

## 2022-10-07 DIAGNOSIS — D689 Coagulation defect, unspecified: Secondary | ICD-10-CM | POA: Diagnosis not present

## 2022-10-07 DIAGNOSIS — N186 End stage renal disease: Secondary | ICD-10-CM | POA: Diagnosis not present

## 2022-10-07 DIAGNOSIS — Z992 Dependence on renal dialysis: Secondary | ICD-10-CM | POA: Diagnosis not present

## 2022-10-09 DIAGNOSIS — N2581 Secondary hyperparathyroidism of renal origin: Secondary | ICD-10-CM | POA: Diagnosis not present

## 2022-10-09 DIAGNOSIS — D689 Coagulation defect, unspecified: Secondary | ICD-10-CM | POA: Diagnosis not present

## 2022-10-09 DIAGNOSIS — N186 End stage renal disease: Secondary | ICD-10-CM | POA: Diagnosis not present

## 2022-10-09 DIAGNOSIS — Z992 Dependence on renal dialysis: Secondary | ICD-10-CM | POA: Diagnosis not present

## 2022-10-11 DIAGNOSIS — N2581 Secondary hyperparathyroidism of renal origin: Secondary | ICD-10-CM | POA: Diagnosis not present

## 2022-10-11 DIAGNOSIS — D689 Coagulation defect, unspecified: Secondary | ICD-10-CM | POA: Diagnosis not present

## 2022-10-11 DIAGNOSIS — N186 End stage renal disease: Secondary | ICD-10-CM | POA: Diagnosis not present

## 2022-10-11 DIAGNOSIS — Z992 Dependence on renal dialysis: Secondary | ICD-10-CM | POA: Diagnosis not present

## 2022-10-14 DIAGNOSIS — D689 Coagulation defect, unspecified: Secondary | ICD-10-CM | POA: Diagnosis not present

## 2022-10-14 DIAGNOSIS — N186 End stage renal disease: Secondary | ICD-10-CM | POA: Diagnosis not present

## 2022-10-14 DIAGNOSIS — N2581 Secondary hyperparathyroidism of renal origin: Secondary | ICD-10-CM | POA: Diagnosis not present

## 2022-10-14 DIAGNOSIS — Z992 Dependence on renal dialysis: Secondary | ICD-10-CM | POA: Diagnosis not present

## 2022-10-16 DIAGNOSIS — N2581 Secondary hyperparathyroidism of renal origin: Secondary | ICD-10-CM | POA: Diagnosis not present

## 2022-10-16 DIAGNOSIS — D689 Coagulation defect, unspecified: Secondary | ICD-10-CM | POA: Diagnosis not present

## 2022-10-16 DIAGNOSIS — N186 End stage renal disease: Secondary | ICD-10-CM | POA: Diagnosis not present

## 2022-10-16 DIAGNOSIS — Z992 Dependence on renal dialysis: Secondary | ICD-10-CM | POA: Diagnosis not present

## 2022-10-18 DIAGNOSIS — N2581 Secondary hyperparathyroidism of renal origin: Secondary | ICD-10-CM | POA: Diagnosis not present

## 2022-10-18 DIAGNOSIS — Z992 Dependence on renal dialysis: Secondary | ICD-10-CM | POA: Diagnosis not present

## 2022-10-18 DIAGNOSIS — D689 Coagulation defect, unspecified: Secondary | ICD-10-CM | POA: Diagnosis not present

## 2022-10-18 DIAGNOSIS — N186 End stage renal disease: Secondary | ICD-10-CM | POA: Diagnosis not present

## 2022-10-21 DIAGNOSIS — N2581 Secondary hyperparathyroidism of renal origin: Secondary | ICD-10-CM | POA: Diagnosis not present

## 2022-10-21 DIAGNOSIS — D689 Coagulation defect, unspecified: Secondary | ICD-10-CM | POA: Diagnosis not present

## 2022-10-21 DIAGNOSIS — N186 End stage renal disease: Secondary | ICD-10-CM | POA: Diagnosis not present

## 2022-10-21 DIAGNOSIS — Z992 Dependence on renal dialysis: Secondary | ICD-10-CM | POA: Diagnosis not present

## 2022-10-23 DIAGNOSIS — Z992 Dependence on renal dialysis: Secondary | ICD-10-CM | POA: Diagnosis not present

## 2022-10-23 DIAGNOSIS — N2581 Secondary hyperparathyroidism of renal origin: Secondary | ICD-10-CM | POA: Diagnosis not present

## 2022-10-23 DIAGNOSIS — N186 End stage renal disease: Secondary | ICD-10-CM | POA: Diagnosis not present

## 2022-10-23 DIAGNOSIS — D689 Coagulation defect, unspecified: Secondary | ICD-10-CM | POA: Diagnosis not present

## 2022-10-25 DIAGNOSIS — N186 End stage renal disease: Secondary | ICD-10-CM | POA: Diagnosis not present

## 2022-10-25 DIAGNOSIS — Z992 Dependence on renal dialysis: Secondary | ICD-10-CM | POA: Diagnosis not present

## 2022-10-25 DIAGNOSIS — D689 Coagulation defect, unspecified: Secondary | ICD-10-CM | POA: Diagnosis not present

## 2022-10-25 DIAGNOSIS — N2581 Secondary hyperparathyroidism of renal origin: Secondary | ICD-10-CM | POA: Diagnosis not present

## 2022-10-28 DIAGNOSIS — D689 Coagulation defect, unspecified: Secondary | ICD-10-CM | POA: Diagnosis not present

## 2022-10-28 DIAGNOSIS — N186 End stage renal disease: Secondary | ICD-10-CM | POA: Diagnosis not present

## 2022-10-28 DIAGNOSIS — Z992 Dependence on renal dialysis: Secondary | ICD-10-CM | POA: Diagnosis not present

## 2022-10-28 DIAGNOSIS — N2581 Secondary hyperparathyroidism of renal origin: Secondary | ICD-10-CM | POA: Diagnosis not present

## 2022-10-30 DIAGNOSIS — E1129 Type 2 diabetes mellitus with other diabetic kidney complication: Secondary | ICD-10-CM | POA: Diagnosis not present

## 2022-10-30 DIAGNOSIS — Z992 Dependence on renal dialysis: Secondary | ICD-10-CM | POA: Diagnosis not present

## 2022-10-30 DIAGNOSIS — N2581 Secondary hyperparathyroidism of renal origin: Secondary | ICD-10-CM | POA: Diagnosis not present

## 2022-10-30 DIAGNOSIS — D689 Coagulation defect, unspecified: Secondary | ICD-10-CM | POA: Diagnosis not present

## 2022-10-30 DIAGNOSIS — N186 End stage renal disease: Secondary | ICD-10-CM | POA: Diagnosis not present

## 2022-11-01 DIAGNOSIS — Z992 Dependence on renal dialysis: Secondary | ICD-10-CM | POA: Diagnosis not present

## 2022-11-01 DIAGNOSIS — D689 Coagulation defect, unspecified: Secondary | ICD-10-CM | POA: Diagnosis not present

## 2022-11-01 DIAGNOSIS — N186 End stage renal disease: Secondary | ICD-10-CM | POA: Diagnosis not present

## 2022-11-01 DIAGNOSIS — N2581 Secondary hyperparathyroidism of renal origin: Secondary | ICD-10-CM | POA: Diagnosis not present

## 2022-11-04 DIAGNOSIS — D689 Coagulation defect, unspecified: Secondary | ICD-10-CM | POA: Diagnosis not present

## 2022-11-04 DIAGNOSIS — N186 End stage renal disease: Secondary | ICD-10-CM | POA: Diagnosis not present

## 2022-11-04 DIAGNOSIS — N2581 Secondary hyperparathyroidism of renal origin: Secondary | ICD-10-CM | POA: Diagnosis not present

## 2022-11-04 DIAGNOSIS — Z992 Dependence on renal dialysis: Secondary | ICD-10-CM | POA: Diagnosis not present

## 2022-11-06 DIAGNOSIS — D689 Coagulation defect, unspecified: Secondary | ICD-10-CM | POA: Diagnosis not present

## 2022-11-06 DIAGNOSIS — Z992 Dependence on renal dialysis: Secondary | ICD-10-CM | POA: Diagnosis not present

## 2022-11-06 DIAGNOSIS — N186 End stage renal disease: Secondary | ICD-10-CM | POA: Diagnosis not present

## 2022-11-06 DIAGNOSIS — N2581 Secondary hyperparathyroidism of renal origin: Secondary | ICD-10-CM | POA: Diagnosis not present

## 2022-11-08 DIAGNOSIS — N186 End stage renal disease: Secondary | ICD-10-CM | POA: Diagnosis not present

## 2022-11-08 DIAGNOSIS — Z992 Dependence on renal dialysis: Secondary | ICD-10-CM | POA: Diagnosis not present

## 2022-11-08 DIAGNOSIS — D689 Coagulation defect, unspecified: Secondary | ICD-10-CM | POA: Diagnosis not present

## 2022-11-08 DIAGNOSIS — N2581 Secondary hyperparathyroidism of renal origin: Secondary | ICD-10-CM | POA: Diagnosis not present

## 2022-11-11 DIAGNOSIS — Z992 Dependence on renal dialysis: Secondary | ICD-10-CM | POA: Diagnosis not present

## 2022-11-11 DIAGNOSIS — N186 End stage renal disease: Secondary | ICD-10-CM | POA: Diagnosis not present

## 2022-11-11 DIAGNOSIS — N2581 Secondary hyperparathyroidism of renal origin: Secondary | ICD-10-CM | POA: Diagnosis not present

## 2022-11-11 DIAGNOSIS — D689 Coagulation defect, unspecified: Secondary | ICD-10-CM | POA: Diagnosis not present

## 2022-11-13 DIAGNOSIS — N2581 Secondary hyperparathyroidism of renal origin: Secondary | ICD-10-CM | POA: Diagnosis not present

## 2022-11-13 DIAGNOSIS — Z992 Dependence on renal dialysis: Secondary | ICD-10-CM | POA: Diagnosis not present

## 2022-11-13 DIAGNOSIS — D689 Coagulation defect, unspecified: Secondary | ICD-10-CM | POA: Diagnosis not present

## 2022-11-13 DIAGNOSIS — N186 End stage renal disease: Secondary | ICD-10-CM | POA: Diagnosis not present

## 2022-11-15 DIAGNOSIS — N186 End stage renal disease: Secondary | ICD-10-CM | POA: Diagnosis not present

## 2022-11-15 DIAGNOSIS — D689 Coagulation defect, unspecified: Secondary | ICD-10-CM | POA: Diagnosis not present

## 2022-11-15 DIAGNOSIS — N2581 Secondary hyperparathyroidism of renal origin: Secondary | ICD-10-CM | POA: Diagnosis not present

## 2022-11-15 DIAGNOSIS — Z992 Dependence on renal dialysis: Secondary | ICD-10-CM | POA: Diagnosis not present

## 2022-11-18 DIAGNOSIS — N2581 Secondary hyperparathyroidism of renal origin: Secondary | ICD-10-CM | POA: Diagnosis not present

## 2022-11-18 DIAGNOSIS — D689 Coagulation defect, unspecified: Secondary | ICD-10-CM | POA: Diagnosis not present

## 2022-11-18 DIAGNOSIS — N186 End stage renal disease: Secondary | ICD-10-CM | POA: Diagnosis not present

## 2022-11-18 DIAGNOSIS — Z992 Dependence on renal dialysis: Secondary | ICD-10-CM | POA: Diagnosis not present

## 2022-11-20 DIAGNOSIS — Z992 Dependence on renal dialysis: Secondary | ICD-10-CM | POA: Diagnosis not present

## 2022-11-20 DIAGNOSIS — D689 Coagulation defect, unspecified: Secondary | ICD-10-CM | POA: Diagnosis not present

## 2022-11-20 DIAGNOSIS — N186 End stage renal disease: Secondary | ICD-10-CM | POA: Diagnosis not present

## 2022-11-20 DIAGNOSIS — N2581 Secondary hyperparathyroidism of renal origin: Secondary | ICD-10-CM | POA: Diagnosis not present

## 2022-11-22 DIAGNOSIS — Z992 Dependence on renal dialysis: Secondary | ICD-10-CM | POA: Diagnosis not present

## 2022-11-22 DIAGNOSIS — D689 Coagulation defect, unspecified: Secondary | ICD-10-CM | POA: Diagnosis not present

## 2022-11-22 DIAGNOSIS — N2581 Secondary hyperparathyroidism of renal origin: Secondary | ICD-10-CM | POA: Diagnosis not present

## 2022-11-22 DIAGNOSIS — N186 End stage renal disease: Secondary | ICD-10-CM | POA: Diagnosis not present

## 2022-11-25 DIAGNOSIS — N186 End stage renal disease: Secondary | ICD-10-CM | POA: Diagnosis not present

## 2022-11-25 DIAGNOSIS — N2581 Secondary hyperparathyroidism of renal origin: Secondary | ICD-10-CM | POA: Diagnosis not present

## 2022-11-25 DIAGNOSIS — Z992 Dependence on renal dialysis: Secondary | ICD-10-CM | POA: Diagnosis not present

## 2022-11-25 DIAGNOSIS — D689 Coagulation defect, unspecified: Secondary | ICD-10-CM | POA: Diagnosis not present

## 2022-11-27 DIAGNOSIS — N2581 Secondary hyperparathyroidism of renal origin: Secondary | ICD-10-CM | POA: Diagnosis not present

## 2022-11-27 DIAGNOSIS — D689 Coagulation defect, unspecified: Secondary | ICD-10-CM | POA: Diagnosis not present

## 2022-11-27 DIAGNOSIS — Z992 Dependence on renal dialysis: Secondary | ICD-10-CM | POA: Diagnosis not present

## 2022-11-27 DIAGNOSIS — N186 End stage renal disease: Secondary | ICD-10-CM | POA: Diagnosis not present

## 2022-11-28 DIAGNOSIS — Z992 Dependence on renal dialysis: Secondary | ICD-10-CM | POA: Diagnosis not present

## 2022-11-28 DIAGNOSIS — E1129 Type 2 diabetes mellitus with other diabetic kidney complication: Secondary | ICD-10-CM | POA: Diagnosis not present

## 2022-11-28 DIAGNOSIS — N186 End stage renal disease: Secondary | ICD-10-CM | POA: Diagnosis not present

## 2022-11-29 DIAGNOSIS — D689 Coagulation defect, unspecified: Secondary | ICD-10-CM | POA: Diagnosis not present

## 2022-11-29 DIAGNOSIS — N186 End stage renal disease: Secondary | ICD-10-CM | POA: Diagnosis not present

## 2022-11-29 DIAGNOSIS — N2581 Secondary hyperparathyroidism of renal origin: Secondary | ICD-10-CM | POA: Diagnosis not present

## 2022-11-29 DIAGNOSIS — Z992 Dependence on renal dialysis: Secondary | ICD-10-CM | POA: Diagnosis not present

## 2022-12-02 DIAGNOSIS — N186 End stage renal disease: Secondary | ICD-10-CM | POA: Diagnosis not present

## 2022-12-02 DIAGNOSIS — Z992 Dependence on renal dialysis: Secondary | ICD-10-CM | POA: Diagnosis not present

## 2022-12-02 DIAGNOSIS — N2581 Secondary hyperparathyroidism of renal origin: Secondary | ICD-10-CM | POA: Diagnosis not present

## 2022-12-02 DIAGNOSIS — D689 Coagulation defect, unspecified: Secondary | ICD-10-CM | POA: Diagnosis not present

## 2022-12-04 DIAGNOSIS — D689 Coagulation defect, unspecified: Secondary | ICD-10-CM | POA: Diagnosis not present

## 2022-12-04 DIAGNOSIS — Z992 Dependence on renal dialysis: Secondary | ICD-10-CM | POA: Diagnosis not present

## 2022-12-04 DIAGNOSIS — N2581 Secondary hyperparathyroidism of renal origin: Secondary | ICD-10-CM | POA: Diagnosis not present

## 2022-12-04 DIAGNOSIS — N186 End stage renal disease: Secondary | ICD-10-CM | POA: Diagnosis not present

## 2022-12-06 DIAGNOSIS — N2581 Secondary hyperparathyroidism of renal origin: Secondary | ICD-10-CM | POA: Diagnosis not present

## 2022-12-06 DIAGNOSIS — D689 Coagulation defect, unspecified: Secondary | ICD-10-CM | POA: Diagnosis not present

## 2022-12-06 DIAGNOSIS — N186 End stage renal disease: Secondary | ICD-10-CM | POA: Diagnosis not present

## 2022-12-06 DIAGNOSIS — Z992 Dependence on renal dialysis: Secondary | ICD-10-CM | POA: Diagnosis not present

## 2022-12-09 DIAGNOSIS — N2581 Secondary hyperparathyroidism of renal origin: Secondary | ICD-10-CM | POA: Diagnosis not present

## 2022-12-09 DIAGNOSIS — I871 Compression of vein: Secondary | ICD-10-CM | POA: Diagnosis not present

## 2022-12-09 DIAGNOSIS — Z992 Dependence on renal dialysis: Secondary | ICD-10-CM | POA: Diagnosis not present

## 2022-12-09 DIAGNOSIS — D689 Coagulation defect, unspecified: Secondary | ICD-10-CM | POA: Diagnosis not present

## 2022-12-09 DIAGNOSIS — T82868A Thrombosis of vascular prosthetic devices, implants and grafts, initial encounter: Secondary | ICD-10-CM | POA: Diagnosis not present

## 2022-12-09 DIAGNOSIS — N186 End stage renal disease: Secondary | ICD-10-CM | POA: Diagnosis not present

## 2022-12-11 DIAGNOSIS — D689 Coagulation defect, unspecified: Secondary | ICD-10-CM | POA: Diagnosis not present

## 2022-12-11 DIAGNOSIS — N186 End stage renal disease: Secondary | ICD-10-CM | POA: Diagnosis not present

## 2022-12-11 DIAGNOSIS — Z992 Dependence on renal dialysis: Secondary | ICD-10-CM | POA: Diagnosis not present

## 2022-12-11 DIAGNOSIS — N2581 Secondary hyperparathyroidism of renal origin: Secondary | ICD-10-CM | POA: Diagnosis not present

## 2022-12-13 DIAGNOSIS — N186 End stage renal disease: Secondary | ICD-10-CM | POA: Diagnosis not present

## 2022-12-13 DIAGNOSIS — N2581 Secondary hyperparathyroidism of renal origin: Secondary | ICD-10-CM | POA: Diagnosis not present

## 2022-12-13 DIAGNOSIS — D689 Coagulation defect, unspecified: Secondary | ICD-10-CM | POA: Diagnosis not present

## 2022-12-13 DIAGNOSIS — Z992 Dependence on renal dialysis: Secondary | ICD-10-CM | POA: Diagnosis not present

## 2022-12-16 DIAGNOSIS — D689 Coagulation defect, unspecified: Secondary | ICD-10-CM | POA: Diagnosis not present

## 2022-12-16 DIAGNOSIS — Z992 Dependence on renal dialysis: Secondary | ICD-10-CM | POA: Diagnosis not present

## 2022-12-16 DIAGNOSIS — N186 End stage renal disease: Secondary | ICD-10-CM | POA: Diagnosis not present

## 2022-12-16 DIAGNOSIS — N2581 Secondary hyperparathyroidism of renal origin: Secondary | ICD-10-CM | POA: Diagnosis not present

## 2022-12-17 DIAGNOSIS — G4733 Obstructive sleep apnea (adult) (pediatric): Secondary | ICD-10-CM | POA: Diagnosis not present

## 2022-12-17 DIAGNOSIS — N185 Chronic kidney disease, stage 5: Secondary | ICD-10-CM | POA: Diagnosis not present

## 2022-12-17 DIAGNOSIS — I770 Arteriovenous fistula, acquired: Secondary | ICD-10-CM | POA: Diagnosis not present

## 2022-12-17 DIAGNOSIS — E7800 Pure hypercholesterolemia, unspecified: Secondary | ICD-10-CM | POA: Diagnosis not present

## 2022-12-17 DIAGNOSIS — E1122 Type 2 diabetes mellitus with diabetic chronic kidney disease: Secondary | ICD-10-CM | POA: Diagnosis not present

## 2022-12-17 DIAGNOSIS — I340 Nonrheumatic mitral (valve) insufficiency: Secondary | ICD-10-CM | POA: Diagnosis not present

## 2022-12-17 DIAGNOSIS — E1129 Type 2 diabetes mellitus with other diabetic kidney complication: Secondary | ICD-10-CM | POA: Diagnosis not present

## 2022-12-17 DIAGNOSIS — Z79899 Other long term (current) drug therapy: Secondary | ICD-10-CM | POA: Diagnosis not present

## 2022-12-17 DIAGNOSIS — I120 Hypertensive chronic kidney disease with stage 5 chronic kidney disease or end stage renal disease: Secondary | ICD-10-CM | POA: Diagnosis not present

## 2022-12-17 DIAGNOSIS — Z992 Dependence on renal dialysis: Secondary | ICD-10-CM | POA: Diagnosis not present

## 2022-12-17 DIAGNOSIS — N2581 Secondary hyperparathyroidism of renal origin: Secondary | ICD-10-CM | POA: Diagnosis not present

## 2022-12-17 DIAGNOSIS — R34 Anuria and oliguria: Secondary | ICD-10-CM | POA: Diagnosis not present

## 2022-12-18 DIAGNOSIS — N2581 Secondary hyperparathyroidism of renal origin: Secondary | ICD-10-CM | POA: Diagnosis not present

## 2022-12-18 DIAGNOSIS — D689 Coagulation defect, unspecified: Secondary | ICD-10-CM | POA: Diagnosis not present

## 2022-12-18 DIAGNOSIS — N186 End stage renal disease: Secondary | ICD-10-CM | POA: Diagnosis not present

## 2022-12-18 DIAGNOSIS — Z992 Dependence on renal dialysis: Secondary | ICD-10-CM | POA: Diagnosis not present

## 2022-12-20 DIAGNOSIS — Z992 Dependence on renal dialysis: Secondary | ICD-10-CM | POA: Diagnosis not present

## 2022-12-20 DIAGNOSIS — N186 End stage renal disease: Secondary | ICD-10-CM | POA: Diagnosis not present

## 2022-12-20 DIAGNOSIS — D689 Coagulation defect, unspecified: Secondary | ICD-10-CM | POA: Diagnosis not present

## 2022-12-20 DIAGNOSIS — N2581 Secondary hyperparathyroidism of renal origin: Secondary | ICD-10-CM | POA: Diagnosis not present

## 2022-12-23 DIAGNOSIS — N2581 Secondary hyperparathyroidism of renal origin: Secondary | ICD-10-CM | POA: Diagnosis not present

## 2022-12-23 DIAGNOSIS — Z992 Dependence on renal dialysis: Secondary | ICD-10-CM | POA: Diagnosis not present

## 2022-12-23 DIAGNOSIS — N186 End stage renal disease: Secondary | ICD-10-CM | POA: Diagnosis not present

## 2022-12-23 DIAGNOSIS — D689 Coagulation defect, unspecified: Secondary | ICD-10-CM | POA: Diagnosis not present

## 2022-12-25 DIAGNOSIS — N2581 Secondary hyperparathyroidism of renal origin: Secondary | ICD-10-CM | POA: Diagnosis not present

## 2022-12-25 DIAGNOSIS — N186 End stage renal disease: Secondary | ICD-10-CM | POA: Diagnosis not present

## 2022-12-25 DIAGNOSIS — Z992 Dependence on renal dialysis: Secondary | ICD-10-CM | POA: Diagnosis not present

## 2022-12-25 DIAGNOSIS — D689 Coagulation defect, unspecified: Secondary | ICD-10-CM | POA: Diagnosis not present

## 2022-12-27 DIAGNOSIS — D689 Coagulation defect, unspecified: Secondary | ICD-10-CM | POA: Diagnosis not present

## 2022-12-27 DIAGNOSIS — Z992 Dependence on renal dialysis: Secondary | ICD-10-CM | POA: Diagnosis not present

## 2022-12-27 DIAGNOSIS — N186 End stage renal disease: Secondary | ICD-10-CM | POA: Diagnosis not present

## 2022-12-27 DIAGNOSIS — N2581 Secondary hyperparathyroidism of renal origin: Secondary | ICD-10-CM | POA: Diagnosis not present

## 2022-12-29 DIAGNOSIS — Z992 Dependence on renal dialysis: Secondary | ICD-10-CM | POA: Diagnosis not present

## 2022-12-29 DIAGNOSIS — E1129 Type 2 diabetes mellitus with other diabetic kidney complication: Secondary | ICD-10-CM | POA: Diagnosis not present

## 2022-12-29 DIAGNOSIS — N186 End stage renal disease: Secondary | ICD-10-CM | POA: Diagnosis not present

## 2022-12-30 DIAGNOSIS — N2581 Secondary hyperparathyroidism of renal origin: Secondary | ICD-10-CM | POA: Diagnosis not present

## 2022-12-30 DIAGNOSIS — D689 Coagulation defect, unspecified: Secondary | ICD-10-CM | POA: Diagnosis not present

## 2022-12-30 DIAGNOSIS — N186 End stage renal disease: Secondary | ICD-10-CM | POA: Diagnosis not present

## 2022-12-30 DIAGNOSIS — Z992 Dependence on renal dialysis: Secondary | ICD-10-CM | POA: Diagnosis not present

## 2022-12-31 ENCOUNTER — Other Ambulatory Visit: Payer: Self-pay | Admitting: *Deleted

## 2022-12-31 DIAGNOSIS — N186 End stage renal disease: Secondary | ICD-10-CM

## 2023-01-01 DIAGNOSIS — D689 Coagulation defect, unspecified: Secondary | ICD-10-CM | POA: Diagnosis not present

## 2023-01-01 DIAGNOSIS — N2581 Secondary hyperparathyroidism of renal origin: Secondary | ICD-10-CM | POA: Diagnosis not present

## 2023-01-01 DIAGNOSIS — Z992 Dependence on renal dialysis: Secondary | ICD-10-CM | POA: Diagnosis not present

## 2023-01-01 DIAGNOSIS — N186 End stage renal disease: Secondary | ICD-10-CM | POA: Diagnosis not present

## 2023-01-03 DIAGNOSIS — I340 Nonrheumatic mitral (valve) insufficiency: Secondary | ICD-10-CM | POA: Diagnosis not present

## 2023-01-03 DIAGNOSIS — D689 Coagulation defect, unspecified: Secondary | ICD-10-CM | POA: Diagnosis not present

## 2023-01-03 DIAGNOSIS — Z992 Dependence on renal dialysis: Secondary | ICD-10-CM | POA: Diagnosis not present

## 2023-01-03 DIAGNOSIS — N186 End stage renal disease: Secondary | ICD-10-CM | POA: Diagnosis not present

## 2023-01-03 DIAGNOSIS — N2581 Secondary hyperparathyroidism of renal origin: Secondary | ICD-10-CM | POA: Diagnosis not present

## 2023-01-06 DIAGNOSIS — D689 Coagulation defect, unspecified: Secondary | ICD-10-CM | POA: Diagnosis not present

## 2023-01-06 DIAGNOSIS — Z992 Dependence on renal dialysis: Secondary | ICD-10-CM | POA: Diagnosis not present

## 2023-01-06 DIAGNOSIS — N186 End stage renal disease: Secondary | ICD-10-CM | POA: Diagnosis not present

## 2023-01-06 DIAGNOSIS — N2581 Secondary hyperparathyroidism of renal origin: Secondary | ICD-10-CM | POA: Diagnosis not present

## 2023-01-06 NOTE — Progress Notes (Unsigned)
[facility] [place]  ASSESSMENT / PLAN: Peter PasseyLynn David Dalton is a 72 y.o. right handed male in need of permanent dialysis access. I reviewed options for dialysis in detail with the patient, including hemodialysis and peritoneal dialysis. I counseled the patient to ask their nephrologist about their candidacy for renal transplant. I counseled the patient that dialysis access requires surveillance and periodic maintenance. Plan to proceed with right arteriovenous graft placement in near future.   CHIEF COMPLAINT: Thrombosed fistula  HISTORY OF PRESENT ILLNESS: Peter [name] is a 72 y.o. male who is well-known to me with end-stage renal disease.  I have created dialysis access for him before.  Most recently created the left arm basilic vein fistula which was transposed in 2022.  This worked well for him until very recently when it thrombosed.  A tunneled dialysis catheter was placed.  He was referred for evaluation of new permanent access.  Past Medical History:  Diagnosis Date   Anemia in chronic kidney disease (CKD)    CKD (chronic kidney disease) stage 5, GFR less than 15 ml/min (HCC)    M-W-F   Diabetes mellitus without complication (HCC) 03/2012   type II - no meds, diet controlled   ED (erectile dysfunction)    Heart murmur    not significant   History of kidney stones    passed 1 , 1 was removed with kidney disease in 2010   Hypercholesteremia    Hypertension    Iron deficiency    Kidney stone 2010   MVP (mitral valve prolapse)    Posterior with MR by ECHO   OSA (obstructive sleep apnea)    does not use cpap, lost weight    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   Syncope     Past Surgical History:  Procedure Laterality Date   A/V FISTULAGRAM Left 02/10/2018   Procedure: A/V FISTULAGRAM;  Surgeon: Nada LibmanBrabham, Vance W, MD;  [facility];  Service: Cardiovascular;  Laterality: Left;   APPLICATION OF WOUND VAC Right  03/15/2021   Procedure: APPLICATION OF WOUND VAC;  Surgeon: Leonie DouglasHawken, Mairlyn Tegtmeyer N, MD;  Location: [facility] OR;  Service: Vascular;  Laterality: Right;   AV FISTULA PLACEMENT Left 06/14/2016   Procedure: ARTERIOVENOUS (AV) FISTULA CREATION LEFT ARM;  Surgeon: Chuck Hinthristopher S Dickson, MD;  [facility];  Service: Vascular;  Laterality: Left;   AV FISTULA PLACEMENT Right 06/09/2018   Procedure: CREATION Right arm Brachiocephalic;  Surgeon: Maeola Harmanain, Brandon Christopher, MD;  [facility];  Service: Vascular;  Laterality: Right;   BASCILIC VEIN TRANSPOSITION Right 09/10/2018   Procedure: CEPHALIC VEIN TRANSPOSITION;  Surgeon: Maeola Harmanain, Brandon Christopher, MD;  [facility];  Service: Vascular;  Laterality: Right;   BASCILIC VEIN TRANSPOSITION Left 01/25/2021   Procedure: LEFT ARMFIRST STAGE BASCILIC VEIN TRANSPOSITION;  Surgeon: Leonie DouglasHawken, Larra Crunkleton N, MD;  [facility];  Service: Vascular;  Laterality: Left;  PERIPHERAL NERVE BLOCK   BASCILIC VEIN TRANSPOSITION Left 03/15/2021   Procedure: LEFT UPPER ARM SECOND STAGE BASILIC TRANSPOSITION;  Surgeon: Leonie DouglasHawken, Kass Herberger N, MD;  [facility]: [facility];  Service: Vascular;  Laterality: Left;   COLONOSCOPY     INSERTION OF DIALYSIS CATHETER N/A 06/09/2018   Procedure: INSERTION OF DIALYSIS CATHETER;  Surgeon: Maeola Harmanain, Brandon Christopher, MD;  [facility];  Service: Vascular;  Laterality: N/A;   JOINT REPLACEMENT Left 2010   hip   PARTIAL NEPHRECTOMY Left 2010   PERIPHERAL VASCULAR INTERVENTION Left 02/10/2018  Procedure: PERIPHERAL VASCULAR INTERVENTION;  Surgeon: Brabham, Vance W, MD;  [facility];  Service: Cardiovascular;  Laterality: Left;   TONSILLECTOMY      Family History  Problem Relation Age of Onset   Dementia Mother    Hypertension Father     Social History   Socioeconomic History   Marital status: Domestic Partner    Spouse name: Not on file   Number of children: Not on file   Years of education: Not on file   Highest education level: Not  on file  Occupational History   Not on file  Tobacco Use   Smoking status: Former    Types: Cigarettes    Quit date: 06/13/1986    Years since quitting: 36.5   Smokeless tobacco: Current    Types: Chew  Vaping Use   Vaping Use: Never used  Substance and Sexual Activity   Alcohol use: No   Drug use: No   Sexual activity: Yes  Other Topics Concern   Not on file  Social History Narrative   Not on file   Social Determinants of Health   Financial Resource Strain: Low Risk  (05/30/2018)   Overall Financial Resource Strain (CARDIA)    Difficulty of Paying Living Expenses: Not hard at all  Food Insecurity: No Food Insecurity (05/20/2022)   Hunger Vital Sign    Worried About Running Out of Food in the Last Year: Never true    Ran Out of Food in the Last Year: Never true  Transportation Needs: No Transportation Needs (05/20/2022)   PRAPARE - Transportation    Lack of Transportation (Medical): No    Lack of Transportation (Non-Medical): No  Physical Activity: Inactive (05/30/2018)   Exercise Vital Sign    Days of Exercise per Week: 0 days    Minutes of Exercise per Session: 0 min  Stress: No Stress Concern Present (05/30/2018)   [employer] - Occupational Stress Questionnaire    Feeling of Stress : Only a little  Social Connections: Socially Integrated (05/30/2018)   Social Connection and Isolation Panel [NHANES]    Frequency of Communication with Friends and Family: More than three times a week    Frequency of Social Gatherings with Friends and Family: More than three times a week    Attends Religious Services: More than 4 times per year    Active Member of Clubs or Organizations: Yes    Attends Club or Organization Meetings: More than 4 times per year    Marital Status: Living with partner  Intimate Partner Violence: Not At Risk (05/30/2018)   Humiliation, Afraid, Rape, and Kick questionnaire    Fear of Current or Ex-Partner: No    Emotionally Abused:  No    Physically Abused: No    Sexually Abused: No    Allergies  Allergen Reactions   Penicillins Other (See Comments)    UNSPECIFIED REACTION Childhood allergy Has patient had a PCN reaction causing immediate rash, facial/tongue/throat swelling, SOB or lightheadedness with hypotension: Unknown Has patient had a PCN reaction causing severe rash involving mucus membranes or skin necrosis: No Has patient had a PCN reaction that required hospitalization No Has patient had a PCN reaction occurring within the last 10 years: No If all of the above answers are "NO", then may proceed with Cephalosporin use.     Doxazosin Mesylate     ineffective for BP (10/2014)   Triamterene-Hctz     increased creatinine (see 09/2014 office note)      Current Outpatient Medications  Medication Sig Dispense Refill   amLODipine (NORVASC) 5 MG tablet Take 5 mg by mouth daily.     atorvastatin (LIPITOR) 20 MG tablet Take 20 mg by mouth daily.     cinacalcet (SENSIPAR) 30 MG tablet Take 60 mg by mouth daily.     No current facility-administered medications for this visit.    PHYSICAL EXAM There were no vitals filed for this visit.  Elderly man in no distress Regular rate and rhythm Unlabored breathing 2+ radial pulses Multiple scars across bilateral upper extremities consistent with multiple dialysis access surgeries  PERTINENT LABORATORY AND RADIOLOGIC DATA  Most recent CBC    Latest Ref Rng & Units 03/15/2021    6:20 AM 01/25/2021    6:05 AM 09/10/2018    6:23 AM  CBC  Hemoglobin 13.0 - 17.0 g/dL 02.2  33.6  9.9   Hematocrit 39.0 - 52.0 % 33.0  35.0  29.0      Most recent CMP    Latest Ref Rng & Units 03/15/2021    6:20 AM 01/25/2021    6:05 AM 09/10/2018    6:23 AM  CMP  Glucose 70 - 99 mg/dL 122  449  89   BUN 8 - 23 mg/dL 17  20    Creatinine 7.53 - 1.24 mg/dL 0.05  1.10    Sodium 211 - 145 mmol/L 136  136  140   Potassium 3.5 - 5.1 mmol/L 3.8  4.0  3.7   Chloride 98 - 111 mmol/L 97   101      Renal function CrCl cannot be calculated (Patient's most recent lab result is older than the maximum 21 days allowed.).  Hgb A1c MFr Bld (%)  Date Value  05/30/2018 5.7 (H)   Vein mapping shows no usable autogenous vein in the upper extremities bilaterally.  Rande Brunt. Lenell Antu, MD FACS [facility] Phone Number: [phone] 01/06/2023 10:54 AM   Total time spent on preparing this encounter including chart review, data review, collecting history, examining the patient, coordinating care for this established patient, 30 minutes.  Portions of this report may have been transcribed using voice recognition software.  Every effort has been made to ensure accuracy; however, inadvertent computerized transcription errors may still be present.

## 2023-01-06 NOTE — H&P (View-Only) (Signed)
[facility] [place]  ASSESSMENT / PLAN: Peter [name] is a 72 y.o. right handed male in need of permanent dialysis access. I reviewed options for dialysis in detail with the patient, including hemodialysis and peritoneal dialysis. I counseled the patient to ask their nephrologist about their candidacy for renal transplant. I counseled the patient that dialysis access requires surveillance and periodic maintenance. Plan to proceed with right arteriovenous graft placement in near future.   CHIEF COMPLAINT: Thrombosed fistula  HISTORY OF PRESENT ILLNESS: Peter [name] is a 72 y.o. male who is well-known to me with end-stage renal disease.  I have created dialysis access for him before.  Most recently created the left arm basilic vein fistula which was transposed in 2022.  This worked well for him until very recently when it thrombosed.  A tunneled dialysis catheter was placed.  He was referred for evaluation of new permanent access.  Past Medical History:  Diagnosis Date   Anemia in chronic kidney disease (CKD)    CKD (chronic kidney disease) stage 5, GFR less than 15 ml/min (HCC)    M-W-F   Diabetes mellitus without complication (HCC) 03/2012   type II - no meds, diet controlled   ED (erectile dysfunction)    Heart murmur    not significant   History of kidney stones    passed 1 , 1 was removed with kidney disease in 2010   Hypercholesteremia    Hypertension    Iron deficiency    Kidney stone 2010   MVP (mitral valve prolapse)    Posterior with MR by ECHO   OSA (obstructive sleep apnea)    does not use cpap, lost weight    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   Syncope     Past Surgical History:  Procedure Laterality Date   A/V FISTULAGRAM Left 02/10/2018   Procedure: A/V FISTULAGRAM;  Surgeon: Nada LibmanBrabham, Vance W, MD;  [facility];  Service: Cardiovascular;  Laterality: Left;   APPLICATION OF WOUND VAC Right  03/15/2021   Procedure: APPLICATION OF WOUND VAC;  Surgeon: Leonie DouglasHawken, Crescentia Boutwell N, MD;  [facility];  Service: Vascular;  Laterality: Right;   AV FISTULA PLACEMENT Left 06/14/2016   Procedure: ARTERIOVENOUS (AV) FISTULA CREATION LEFT ARM;  Surgeon: Chuck Hinthristopher S Dickson, MD;  [facility];  Service: Vascular;  Laterality: Left;   AV FISTULA PLACEMENT Right 06/09/2018   Procedure: CREATION Right arm Brachiocephalic;  Surgeon: Maeola Harmanain, Brandon Christopher, MD;  [facility];  Service: Vascular;  Laterality: Right;   BASCILIC VEIN TRANSPOSITION Right 09/10/2018   Procedure: CEPHALIC VEIN TRANSPOSITION;  Surgeon: Maeola Harmanain, Brandon Christopher, MD;  [facility];  Service: Vascular;  Laterality: Right;   BASCILIC VEIN TRANSPOSITION Left 01/25/2021   Procedure: LEFT ARMFIRST STAGE BASCILIC VEIN TRANSPOSITION;  Surgeon: Leonie DouglasHawken, Stepfon Rawles N, MD;  [facility];  Service: Vascular;  Laterality: Left;  PERIPHERAL NERVE BLOCK   BASCILIC VEIN TRANSPOSITION Left 03/15/2021   Procedure: LEFT UPPER ARM SECOND STAGE BASILIC TRANSPOSITION;  Surgeon: Leonie DouglasHawken, Mali Eppard N, MD;  [facility];  Service: Vascular;  Laterality: Left;   COLONOSCOPY     INSERTION OF DIALYSIS CATHETER N/A 06/09/2018   Procedure: INSERTION OF DIALYSIS CATHETER;  Surgeon: Maeola Harmanain, Brandon Christopher, MD;  [facility];  Service: Vascular;  Laterality: N/A;   JOINT REPLACEMENT Left 2010   hip   PARTIAL NEPHRECTOMY Left 2010   PERIPHERAL VASCULAR INTERVENTION Left 02/10/2018  Procedure: PERIPHERAL VASCULAR INTERVENTION;  Surgeon: Nada LibmanBrabham, Vance W, MD;  [facility];  Service: Cardiovascular;  Laterality: Left;   TONSILLECTOMY      Family History  Problem Relation Age of Onset   Dementia Mother    Hypertension Father     Social History   Socioeconomic History   Marital status: Media plannerDomestic Partner    Spouse name: Not on file   Number of children: Not on file   Years of education: Not on file   Highest education level: Not  on file  Occupational History   Not on file  Tobacco Use   Smoking status: Former    Types: Cigarettes    Quit date: 06/13/1986    Years since quitting: 36.5   Smokeless tobacco: Current    Types: Chew  Vaping Use   Vaping Use: Never used  Substance and Sexual Activity   Alcohol use: No   Drug use: No   Sexual activity: Yes  Other Topics Concern   Not on file  Social History Narrative   Not on file   Social Determinants of Health   Financial Resource Strain: Low Risk  (05/30/2018)   Overall Financial Resource Strain (CARDIA)    Difficulty of Paying Living Expenses: Not hard at all  Food Insecurity: No Food Insecurity (05/20/2022)   Hunger Vital Sign    Worried About Running Out of Food in the Last Year: Never true    Ran Out of Food in the Last Year: Never true  Transportation Needs: No Transportation Needs (05/20/2022)   PRAPARE - Administrator, Civil ServiceTransportation    Lack of Transportation (Medical): No    Lack of Transportation (Non-Medical): No  Physical Activity: Inactive (05/30/2018)   Exercise Vital Sign    Days of Exercise per Week: 0 days    Minutes of Exercise per Session: 0 min  Stress: No Stress Concern Present (05/30/2018)   [employer] - Occupational Stress Questionnaire    Feeling of Stress : Only a little  Social Connections: Socially Integrated (05/30/2018)   Social Connection and Isolation Panel [NHANES]    Frequency of Communication with Friends and Family: More than three times a week    Frequency of Social Gatherings with Friends and Family: More than three times a week    Attends Religious Services: More than 4 times per year    Active Member of [employer] FinancialClubs or Organizations: Yes    Attends BankerClub or Organization Meetings: More than 4 times per year    Marital Status: Living with partner  Intimate Partner Violence: Not At Risk (05/30/2018)   Humiliation, Afraid, Rape, and Kick questionnaire    Fear of Current or Ex-Partner: No    Emotionally Abused:  No    Physically Abused: No    Sexually Abused: No    Allergies  Allergen Reactions   Penicillins Other (See Comments)    UNSPECIFIED REACTION Childhood allergy Has patient had a PCN reaction causing immediate rash, facial/tongue/throat swelling, SOB or lightheadedness with hypotension: Unknown Has patient had a PCN reaction causing severe rash involving mucus membranes or skin necrosis: No Has patient had a PCN reaction that required hospitalization No Has patient had a PCN reaction occurring within the last 10 years: No If all of the above answers are "NO", then may proceed with Cephalosporin use.     Doxazosin Mesylate     ineffective for BP (10/2014)   Triamterene-Hctz     increased creatinine (see 09/2014 office note)  Current Outpatient Medications  Medication Sig Dispense Refill   amLODipine (NORVASC) 5 MG tablet Take 5 mg by mouth daily.     atorvastatin (LIPITOR) 20 MG tablet Take 20 mg by mouth daily.     cinacalcet (SENSIPAR) 30 MG tablet Take 60 mg by mouth daily.     No current facility-administered medications for this visit.    PHYSICAL EXAM There were no vitals filed for this visit.  Elderly man in no distress Regular rate and rhythm Unlabored breathing 2+ radial pulses Multiple scars across bilateral upper extremities consistent with multiple dialysis access surgeries  PERTINENT LABORATORY AND RADIOLOGIC DATA  Most recent CBC    Latest Ref Rng & Units 03/15/2021    6:20 AM 01/25/2021    6:05 AM 09/10/2018    6:23 AM  CBC  Hemoglobin 13.0 - 17.0 g/dL 02.2  33.6  9.9   Hematocrit 39.0 - 52.0 % 33.0  35.0  29.0      Most recent CMP    Latest Ref Rng & Units 03/15/2021    6:20 AM 01/25/2021    6:05 AM 09/10/2018    6:23 AM  CMP  Glucose 70 - 99 mg/dL 122  449  89   BUN 8 - 23 mg/dL 17  20    Creatinine 7.53 - 1.24 mg/dL 0.05  1.10    Sodium 211 - 145 mmol/L 136  136  140   Potassium 3.5 - 5.1 mmol/L 3.8  4.0  3.7   Chloride 98 - 111 mmol/L 97   101      Renal function CrCl cannot be calculated (Patient's most recent lab result is older than the maximum 21 days allowed.).  Hgb A1c MFr Bld (%)  Date Value  05/30/2018 5.7 (H)   Vein mapping shows no usable autogenous vein in the upper extremities bilaterally.  Rande Brunt. Lenell Antu, MD FACS [facility] Phone Number: [phone] 01/06/2023 10:54 AM   Total time spent on preparing this encounter including chart review, data review, collecting history, examining the patient, coordinating care for this established patient, 30 minutes.  Portions of this report may have been transcribed using voice recognition software.  Every effort has been made to ensure accuracy; however, inadvertent computerized transcription errors may still be present.

## 2023-01-07 ENCOUNTER — Ambulatory Visit (INDEPENDENT_AMBULATORY_CARE_PROVIDER_SITE_OTHER)
Admission: RE | Admit: 2023-01-07 | Discharge: 2023-01-07 | Disposition: A | Discharge status: Home / Self Care | Payer: Medicare Other | Source: Ambulatory Visit | Attending: Vascular Surgery | Admitting: Vascular Surgery

## 2023-01-07 ENCOUNTER — Ambulatory Visit: Payer: Medicare Other | Admitting: Vascular Surgery

## 2023-01-07 ENCOUNTER — Encounter: Payer: Self-pay | Admitting: Vascular Surgery

## 2023-01-07 ENCOUNTER — Ambulatory Visit (HOSPITAL_COMMUNITY)
Admission: RE | Admit: 2023-01-07 | Discharge: 2023-01-07 | Disposition: A | Discharge status: Home / Self Care | Payer: Medicare Other | Source: Ambulatory Visit | Attending: Vascular Surgery | Admitting: Vascular Surgery

## 2023-01-07 VITALS — BP 139/78 | HR 67 | Temp 97.8°F | Resp 20 | Ht 67.0 in | Wt 218.0 lb

## 2023-01-07 DIAGNOSIS — N186 End stage renal disease: Secondary | ICD-10-CM

## 2023-01-07 DIAGNOSIS — Z992 Dependence on renal dialysis: Secondary | ICD-10-CM

## 2023-01-08 ENCOUNTER — Other Ambulatory Visit: Payer: Self-pay

## 2023-01-08 DIAGNOSIS — D689 Coagulation defect, unspecified: Secondary | ICD-10-CM | POA: Diagnosis not present

## 2023-01-08 DIAGNOSIS — N186 End stage renal disease: Secondary | ICD-10-CM | POA: Diagnosis not present

## 2023-01-08 DIAGNOSIS — Z992 Dependence on renal dialysis: Secondary | ICD-10-CM | POA: Diagnosis not present

## 2023-01-08 DIAGNOSIS — N2581 Secondary hyperparathyroidism of renal origin: Secondary | ICD-10-CM | POA: Diagnosis not present

## 2023-01-10 DIAGNOSIS — D689 Coagulation defect, unspecified: Secondary | ICD-10-CM | POA: Diagnosis not present

## 2023-01-10 DIAGNOSIS — Z992 Dependence on renal dialysis: Secondary | ICD-10-CM | POA: Diagnosis not present

## 2023-01-10 DIAGNOSIS — N2581 Secondary hyperparathyroidism of renal origin: Secondary | ICD-10-CM | POA: Diagnosis not present

## 2023-01-10 DIAGNOSIS — N186 End stage renal disease: Secondary | ICD-10-CM | POA: Diagnosis not present

## 2023-01-13 DIAGNOSIS — Z992 Dependence on renal dialysis: Secondary | ICD-10-CM | POA: Diagnosis not present

## 2023-01-13 DIAGNOSIS — D689 Coagulation defect, unspecified: Secondary | ICD-10-CM | POA: Diagnosis not present

## 2023-01-13 DIAGNOSIS — N186 End stage renal disease: Secondary | ICD-10-CM | POA: Diagnosis not present

## 2023-01-13 DIAGNOSIS — N2581 Secondary hyperparathyroidism of renal origin: Secondary | ICD-10-CM | POA: Diagnosis not present

## 2023-01-15 ENCOUNTER — Telehealth: Payer: Self-pay

## 2023-01-15 ENCOUNTER — Encounter (HOSPITAL_COMMUNITY): Payer: Self-pay | Admitting: Vascular Surgery

## 2023-01-15 DIAGNOSIS — D689 Coagulation defect, unspecified: Secondary | ICD-10-CM | POA: Diagnosis not present

## 2023-01-15 DIAGNOSIS — N2581 Secondary hyperparathyroidism of renal origin: Secondary | ICD-10-CM | POA: Diagnosis not present

## 2023-01-15 DIAGNOSIS — Z992 Dependence on renal dialysis: Secondary | ICD-10-CM | POA: Diagnosis not present

## 2023-01-15 DIAGNOSIS — N186 End stage renal disease: Secondary | ICD-10-CM | POA: Diagnosis not present

## 2023-01-15 NOTE — Telephone Encounter (Signed)
Attempted to reach patient to update on arrival time change to 0700 AM for surgery on tomorrow at [name]. Left general message at all contact numbers with appt information.

## 2023-01-15 NOTE — Progress Notes (Addendum)
PCP - Dr [name] Cardiologist - none Nephrology - Dr Vallery Sa  Chest x-ray - 06/09/18 EKG - DOS Stress Test - n/a ECHO - 05/30/18 Cardiac Cath - n/a  ICD Pacemaker/Loop - n/a  Sleep Study -  Yes (2011) CPAP - does not uses CPAP, patient lost weight  Diabetes - n/a  NPO  Anesthesia review: Yes  STOP now taking any Aspirin (unless otherwise instructed by your surgeon), Aleve, Naproxen, Ibuprofen, Motrin, Advil, Goody's, BC's, all herbal medications, fish oil, and all vitamins.   Coronavirus Screening Does the patient have any of the following symptoms:  Cough yes/no: No Fever (>100.62F)  yes/no: No Runny nose yes/no: No Sore throat yes/no: No Difficulty breathing/shortness of breath  yes/no: No  Has the patient traveled in the last 14 days and where? yes/no: No  Information and instructions were given by S.O. Wells Guiles.  Sarah verbalized understanding of instructions that were given via phone.

## 2023-01-15 NOTE — Progress Notes (Signed)
Anesthesia Chart Review: Same day workup  72 yo male with pertinent hx including ESRD on HD, diet-controlled DM2, HTN, OSA (no longer on CPAP after weight loss), mitral regurgitation on prior echo. Recent echo 01/03/23 ordered by PCP Dr. Manus Gunning showed EF 82%, normal wall motion, grade 3 diastolic dysfunction, normal valves, no significant stenosis or regurgitation.   Pt fistula recently thrombosed, now dialyzing via [facility] MWF.  Pt will need DOS labs and eval.   TTE 01/03/2023 (copy on chart): Conclusions: Left ventricle cavity is normal in size. Normal left ventricular wall thickness. Normal global wall motion. Doppler evidence of grade 3 diastolic dysfunction. Calculated EF 82%. Left atrial cavity is mildly dilated. Structurally normal trileaflet aortic valve with no regurgitation. Structurally normal mitral valve with no regurgitation. Structurally normal tricuspid valve with no regurgitation. Structurally normal pulmonic valve with no regurgitation.    Zannie Cove [facility] Short Stay Center/Anesthesiology Phone [phone] 01/15/2023 4:22 PM

## 2023-01-15 NOTE — Anesthesia Preprocedure Evaluation (Signed)
Anesthesia Evaluation  Patient identified by MRN, date of birth, ID band Patient awake    Reviewed: Allergy & Precautions, H&P , NPO status , Patient's Chart, lab work & pertinent test results  Airway Mallampati: II  TM Distance: >3 FB Neck ROM: Full    Dental no notable dental hx.    Pulmonary shortness of breath, sleep apnea , former smoker   Pulmonary exam normal breath sounds clear to auscultation       Cardiovascular hypertension, Pt. on medications + Peripheral Vascular Disease  Normal cardiovascular exam+ dysrhythmias Atrial Fibrillation + Valvular Problems/Murmurs  Rhythm:Regular Rate:Normal     Neuro/Psych  Headaches  negative psych ROS   GI/Hepatic negative GI ROS, Neg liver ROS,,,  Endo/Other  negative endocrine ROSdiabetes, Type 2    Renal/GU ESRF and DialysisRenal diseasenegative Renal ROS  negative genitourinary   Musculoskeletal negative musculoskeletal ROS (+)    Abdominal  (+) + obese  Peds negative pediatric ROS (+)  Hematology  (+) Blood dyscrasia, anemia   Anesthesia Other Findings   Reproductive/Obstetrics negative OB ROS                             Anesthesia Physical Anesthesia Plan  ASA: 4  Anesthesia Plan: MAC and Regional   Post-op Pain Management: Regional block* and Minimal or no pain anticipated   Induction: Intravenous  PONV Risk Score and Plan: 1 and Ondansetron and Treatment may vary due to age or medical condition  Airway Management Planned: Simple Face Mask  Additional Equipment:   Intra-op Plan:   Post-operative Plan:   Informed Consent: I have reviewed the patients History and Physical, chart, labs and discussed the procedure including the risks, benefits and alternatives for the proposed anesthesia with the patient or authorized representative who has indicated his/her understanding and acceptance.     Dental advisory given  Plan  Discussed with: CRNA  Anesthesia Plan Comments: (PAT note by Antionette Poles, PA-C: 72 yo male with pertinent hx including ESRD on HD, diet-controlled DM2, HTN, OSA (no longer on CPAP after weight loss), mitral regurgitation on prior echo. Recent echo 01/03/23 ordered by PCP Dr. Manus Gunning showed EF 82%, normal wall motion, grade 3 diastolic dysfunction, normal valves, no significant stenosis or regurgitation.   Pt fistula recently thrombosed, now dialyzing via [facility] MWF.  Pt will need DOS labs and eval.   TTE 01/03/2023 (copy on chart): Conclusions: Left ventricle cavity is normal in size. Normal left ventricular wall thickness. Normal global wall motion. Doppler evidence of grade 3 diastolic dysfunction. Calculated EF 82%. Left atrial cavity is mildly dilated. Structurally normal trileaflet aortic valve with no regurgitation. Structurally normal mitral valve with no regurgitation. Structurally normal tricuspid valve with no regurgitation. Structurally normal pulmonic valve with no regurgitation.   )        Anesthesia Quick Evaluation

## 2023-01-16 ENCOUNTER — Ambulatory Visit (HOSPITAL_COMMUNITY)
Admission: RE | Admit: 2023-01-16 | Discharge: 2023-01-16 | Disposition: A | Discharge status: Home / Self Care | Payer: Medicare Other | Attending: Vascular Surgery | Admitting: Vascular Surgery

## 2023-01-16 ENCOUNTER — Ambulatory Visit (HOSPITAL_BASED_OUTPATIENT_CLINIC_OR_DEPARTMENT_OTHER): Payer: Medicare Other | Admitting: Physician Assistant

## 2023-01-16 ENCOUNTER — Ambulatory Visit (HOSPITAL_COMMUNITY): Payer: Medicare Other | Admitting: Physician Assistant

## 2023-01-16 ENCOUNTER — Other Ambulatory Visit: Payer: Self-pay

## 2023-01-16 ENCOUNTER — Encounter (HOSPITAL_COMMUNITY)
Admission: RE | Disposition: A | Discharge status: Home / Self Care | Payer: Self-pay | Source: Home / Self Care | Attending: Vascular Surgery

## 2023-01-16 ENCOUNTER — Encounter (HOSPITAL_COMMUNITY): Payer: Self-pay | Admitting: Vascular Surgery

## 2023-01-16 DIAGNOSIS — I120 Hypertensive chronic kidney disease with stage 5 chronic kidney disease or end stage renal disease: Secondary | ICD-10-CM | POA: Diagnosis not present

## 2023-01-16 DIAGNOSIS — Z905 Acquired absence of kidney: Secondary | ICD-10-CM | POA: Insufficient documentation

## 2023-01-16 DIAGNOSIS — D631 Anemia in chronic kidney disease: Secondary | ICD-10-CM

## 2023-01-16 DIAGNOSIS — Z8249 Family history of ischemic heart disease and other diseases of the circulatory system: Secondary | ICD-10-CM | POA: Diagnosis not present

## 2023-01-16 DIAGNOSIS — G8918 Other acute postprocedural pain: Secondary | ICD-10-CM | POA: Diagnosis not present

## 2023-01-16 DIAGNOSIS — Z87891 Personal history of nicotine dependence: Secondary | ICD-10-CM | POA: Insufficient documentation

## 2023-01-16 DIAGNOSIS — Z6835 Body mass index (BMI) 35.0-35.9, adult: Secondary | ICD-10-CM | POA: Insufficient documentation

## 2023-01-16 DIAGNOSIS — N186 End stage renal disease: Secondary | ICD-10-CM

## 2023-01-16 DIAGNOSIS — G4730 Sleep apnea, unspecified: Secondary | ICD-10-CM | POA: Diagnosis not present

## 2023-01-16 DIAGNOSIS — I739 Peripheral vascular disease, unspecified: Secondary | ICD-10-CM | POA: Insufficient documentation

## 2023-01-16 DIAGNOSIS — Z992 Dependence on renal dialysis: Secondary | ICD-10-CM | POA: Diagnosis not present

## 2023-01-16 DIAGNOSIS — I4891 Unspecified atrial fibrillation: Secondary | ICD-10-CM | POA: Insufficient documentation

## 2023-01-16 DIAGNOSIS — E669 Obesity, unspecified: Secondary | ICD-10-CM | POA: Diagnosis not present

## 2023-01-16 DIAGNOSIS — E1122 Type 2 diabetes mellitus with diabetic chronic kidney disease: Secondary | ICD-10-CM | POA: Insufficient documentation

## 2023-01-16 DIAGNOSIS — N185 Chronic kidney disease, stage 5: Secondary | ICD-10-CM | POA: Diagnosis not present

## 2023-01-16 HISTORY — PX: AV FISTULA PLACEMENT: SHX1204

## 2023-01-16 LAB — POCT I-STAT, CHEM 8
BUN: 30 mg/dL — ABNORMAL HIGH (ref 8–23)
Calcium, Ion: 1.16 mmol/L (ref 1.15–1.40)
Chloride: 96 mmol/L — ABNORMAL LOW (ref 98–111)
Creatinine, Ser: 7 mg/dL — ABNORMAL HIGH (ref 0.61–1.24)
Glucose, Bld: 110 mg/dL — ABNORMAL HIGH (ref 70–99)
HCT: 37 % — ABNORMAL LOW (ref 39.0–52.0)
Hemoglobin: 12.6 g/dL — ABNORMAL LOW (ref 13.0–17.0)
Potassium: 3.3 mmol/L — ABNORMAL LOW (ref 3.5–5.1)
Sodium: 133 mmol/L — ABNORMAL LOW (ref 135–145)
TCO2: 28 mmol/L (ref 22–32)

## 2023-01-16 LAB — GLUCOSE, CAPILLARY
Glucose-Capillary: 117 mg/dL — ABNORMAL HIGH (ref 70–99)
Glucose-Capillary: 137 mg/dL — ABNORMAL HIGH (ref 70–99)
Glucose-Capillary: 108 mg/dL — ABNORMAL HIGH (ref 70–99)

## 2023-01-16 SURGERY — INSERTION OF ARTERIOVENOUS (AV) GORE-TEX GRAFT ARM
Anesthesia: Monitor Anesthesia Care | Site: Arm Upper | Laterality: Right

## 2023-01-16 MED ORDER — MIDAZOLAM HCL 2 MG/2ML IJ SOLN
INTRAMUSCULAR | Status: AC
  Administered 2023-01-16: 2 mg via INTRAVENOUS
  Filled 2023-01-16: qty 2

## 2023-01-16 MED ORDER — ORAL CARE MOUTH RINSE: 15.0000 mL | Freq: Once | OROMUCOSAL | Status: AC

## 2023-01-16 MED ORDER — GLYCOPYRROLATE PF 0.2 MG/ML IJ SOSY
PREFILLED_SYRINGE | INTRAMUSCULAR | Status: DC | PRN
  Administered 2023-01-16: .1 mg via INTRAVENOUS

## 2023-01-16 MED ORDER — LIDOCAINE-EPINEPHRINE (PF) 1 %-1:200000 IJ SOLN
INTRAMUSCULAR | Status: AC
  Filled 2023-01-16: qty 30

## 2023-01-16 MED ORDER — CHLORHEXIDINE GLUCONATE 0.12 % MT SOLN
15.0000 mL | Freq: Once | OROMUCOSAL | Status: AC
  Administered 2023-01-16: 15 mL via OROMUCOSAL
  Filled 2023-01-16: qty 15

## 2023-01-16 MED ORDER — OXYCODONE HCL 5 MG/5ML PO SOLN: 5.0000 mg | Freq: Once | ORAL | Status: DC | PRN

## 2023-01-16 MED ORDER — VANCOMYCIN HCL IN DEXTROSE 1-5 GM/200ML-% IV SOLN
1000.0000 mg | INTRAVENOUS | Status: AC
  Administered 2023-01-16: 1000 mg via INTRAVENOUS
  Filled 2023-01-16: qty 200

## 2023-01-16 MED ORDER — LIDOCAINE HCL (PF) 1 % IJ SOLN
INTRAMUSCULAR | Status: AC
  Filled 2023-01-16: qty 30

## 2023-01-16 MED ORDER — PROMETHAZINE HCL 25 MG/ML IJ SOLN: 6.2500 mg | INTRAMUSCULAR | Status: DC | PRN

## 2023-01-16 MED ORDER — 0.9 % SODIUM CHLORIDE (POUR BTL) OPTIME
TOPICAL | Status: DC | PRN
  Administered 2023-01-16: 1000 mL

## 2023-01-16 MED ORDER — KETAMINE HCL 50 MG/5ML IJ SOSY
PREFILLED_SYRINGE | INTRAMUSCULAR | Status: AC
  Filled 2023-01-16: qty 5

## 2023-01-16 MED ORDER — ROPIVACAINE HCL 5 MG/ML IJ SOLN
INTRAMUSCULAR | Status: DC | PRN
  Administered 2023-01-16: 40 mL via PERINEURAL

## 2023-01-16 MED ORDER — TRAMADOL HCL 50 MG PO TABS: 50.0000 mg | ORAL_TABLET | Freq: Four times a day (QID) | ORAL | 0 refills | Status: DC | PRN

## 2023-01-16 MED ORDER — HEPARIN 6000 UNIT IRRIGATION SOLUTION
Status: DC | PRN
  Administered 2023-01-16: 1

## 2023-01-16 MED ORDER — HEPARIN 6000 UNIT IRRIGATION SOLUTION
Status: AC
  Filled 2023-01-16: qty 500

## 2023-01-16 MED ORDER — KETAMINE HCL 10 MG/ML IJ SOLN
INTRAMUSCULAR | Status: DC | PRN
  Administered 2023-01-16: 20 mg via INTRAVENOUS

## 2023-01-16 MED ORDER — SODIUM CHLORIDE 0.9 % IV SOLN: INTRAVENOUS | Status: DC

## 2023-01-16 MED ORDER — LACTATED RINGERS IV SOLN: INTRAVENOUS | Status: DC

## 2023-01-16 MED ORDER — CHLORHEXIDINE GLUCONATE 4 % EX LIQD
60.0000 mL | Freq: Once | CUTANEOUS | Status: DC
  Filled 2023-01-16: qty 60

## 2023-01-16 MED ORDER — HYDROMORPHONE HCL 1 MG/ML IJ SOLN: 0.2500 mg | INTRAMUSCULAR | Status: DC | PRN

## 2023-01-16 MED ORDER — MIDAZOLAM HCL 2 MG/2ML IJ SOLN
2.0000 mg | Freq: Once | INTRAMUSCULAR | Status: AC
  Filled 2023-01-16: qty 2

## 2023-01-16 MED ORDER — CHLORHEXIDINE GLUCONATE 4 % EX LIQD: 60.0000 mL | Freq: Once | CUTANEOUS | Status: DC

## 2023-01-16 MED ORDER — PROPOFOL 500 MG/50ML IV EMUL
INTRAVENOUS | Status: DC | PRN
  Administered 2023-01-16: 50 ug/kg/min via INTRAVENOUS

## 2023-01-16 MED ORDER — PROPOFOL 10 MG/ML IV BOLUS
INTRAVENOUS | Status: DC | PRN
  Administered 2023-01-16: 20 mg via INTRAVENOUS
  Administered 2023-01-16: 30 mg via INTRAVENOUS

## 2023-01-16 MED ORDER — FENTANYL CITRATE (PF) 100 MCG/2ML IJ SOLN
INTRAMUSCULAR | Status: AC
  Administered 2023-01-16: 50 ug
  Filled 2023-01-16: qty 2

## 2023-01-16 MED ORDER — FENTANYL CITRATE PF 50 MCG/ML IJ SOSY
50.0000 ug | PREFILLED_SYRINGE | Freq: Once | INTRAMUSCULAR | Status: DC
  Filled 2023-01-16: qty 1

## 2023-01-16 MED ORDER — INSULIN ASPART 100 UNIT/ML IJ SOLN: 0.0000 [IU] | INTRAMUSCULAR | Status: DC | PRN

## 2023-01-16 MED ORDER — OXYCODONE HCL 5 MG PO TABS: 5.0000 mg | ORAL_TABLET | Freq: Once | ORAL | Status: DC | PRN

## 2023-01-16 MED ORDER — HEMOSTATIC AGENTS (NO CHARGE) OPTIME
TOPICAL | Status: DC | PRN
  Administered 2023-01-16: 1 via TOPICAL

## 2023-01-16 SURGICAL SUPPLY — 46 items
APL PRP STRL LF DISP 70% ISPRP (MISCELLANEOUS) ×1
APL SKNCLS STERI-STRIP NONHPOA (GAUZE/BANDAGES/DRESSINGS) ×1
ARMBAND PINK RESTRICT EXTREMIT (MISCELLANEOUS) ×1 IMPLANT
BENZOIN TINCTURE PRP APPL 2/3 (GAUZE/BANDAGES/DRESSINGS) ×1 IMPLANT
CANISTER SUCT 3000ML PPV (MISCELLANEOUS) ×1 IMPLANT
CANNULA VESSEL 3MM 2 BLNT TIP (CANNULA) ×1 IMPLANT
CHLORAPREP W/TINT 26 (MISCELLANEOUS) ×1 IMPLANT
CLIP LIGATING EXTRA MED SLVR (CLIP) ×1 IMPLANT
CLIP LIGATING EXTRA SM BLUE (MISCELLANEOUS) ×1 IMPLANT
CLSR STERI-STRIP ANTIMIC 1/2X4 (GAUZE/BANDAGES/DRESSINGS) IMPLANT
DRSG OPSITE 4X5.5 SM (GAUZE/BANDAGES/DRESSINGS) IMPLANT
ELECT REM PT RETURN 9FT ADLT (ELECTROSURGICAL) ×1
ELECTRODE REM PT RTRN 9FT ADLT (ELECTROSURGICAL) ×1 IMPLANT
GAUZE SPONGE 4X4 12PLY STRL (GAUZE/BANDAGES/DRESSINGS) IMPLANT
GLOVE BIO SURGEON STRL SZ8 (GLOVE) ×1 IMPLANT
GOWN STRL REUS W/ TWL LRG LVL3 (GOWN DISPOSABLE) ×2 IMPLANT
GOWN STRL REUS W/ TWL XL LVL3 (GOWN DISPOSABLE) ×1 IMPLANT
GOWN STRL REUS W/TWL LRG LVL3 (GOWN DISPOSABLE) ×2
GOWN STRL REUS W/TWL XL LVL3 (GOWN DISPOSABLE) ×1
GRAFT GORETEX STRT 4-7X45 (Vascular Products) IMPLANT
HEMOSTAT SNOW SURGICEL 2X4 (HEMOSTASIS) IMPLANT
INSERT FOGARTY SM (MISCELLANEOUS) IMPLANT
KIT BASIN OR (CUSTOM PROCEDURE TRAY) ×1 IMPLANT
KIT TURNOVER KIT B (KITS) ×1 IMPLANT
LOOP VASCULAR MINI 18 RED (MISCELLANEOUS) ×1
NDL 18GX1X1/2 (RX/OR ONLY) (NEEDLE) IMPLANT
NEEDLE 18GX1X1/2 (RX/OR ONLY) (NEEDLE) IMPLANT
NS IRRIG 1000ML POUR BTL (IV SOLUTION) ×1 IMPLANT
PACK CV ACCESS (CUSTOM PROCEDURE TRAY) ×1 IMPLANT
PAD ARMBOARD 7.5X6 YLW CONV (MISCELLANEOUS) ×2 IMPLANT
SHEATH PROBE COVER 6X72 (BAG) ×1 IMPLANT
SLING ARM FOAM STRAP LRG (SOFTGOODS) IMPLANT
SLING ARM FOAM STRAP MED (SOFTGOODS) IMPLANT
STRIP CLOSURE SKIN 1/2X4 (GAUZE/BANDAGES/DRESSINGS) ×1 IMPLANT
SUT GORETEX 6.0 TT9 (SUTURE) ×1 IMPLANT
SUT MNCRL AB 4-0 PS2 18 (SUTURE) IMPLANT
SUT PROLENE 6 0 BV (SUTURE) IMPLANT
SUT SILK 2 0 SH (SUTURE) IMPLANT
SUT VIC AB 3-0 SH 27 (SUTURE) ×2
SUT VIC AB 3-0 SH 27X BRD (SUTURE) ×2 IMPLANT
SYR 3ML LL SCALE MARK (SYRINGE) IMPLANT
SYR TOOMEY 50ML (SYRINGE) IMPLANT
TOWEL GREEN STERILE (TOWEL DISPOSABLE) ×1 IMPLANT
UNDERPAD 30X36 HEAVY ABSORB (UNDERPADS AND DIAPERS) ×1 IMPLANT
VASCULAR TIE MINI RED 18IN STL (MISCELLANEOUS) IMPLANT
WATER STERILE IRR 1000ML POUR (IV SOLUTION) ×1 IMPLANT

## 2023-01-16 NOTE — Anesthesia Procedure Notes (Signed)
Procedure Name: General with mask airway Date/Time: 01/16/2023 10:38 AM  Performed by: Katina Degree, CRNAPre-anesthesia Checklist: Patient identified, Emergency Drugs available, Suction available and Patient being monitored Patient Re-evaluated:Patient Re-evaluated prior to induction Oxygen Delivery Method: Simple face mask Preoxygenation: Pre-oxygenation with 100% oxygen Induction Type: IV induction Dental Injury: Teeth and Oropharynx as per pre-operative assessment

## 2023-01-16 NOTE — Anesthesia Postprocedure Evaluation (Signed)
Anesthesia Post Note  Patient: Peter [name]  Procedure(s) Performed: INSERTION OF RIGHT ARM ARTERIOVENOUS (AV) GORE-TEX GRAFT (Right: Arm Upper)     Patient location during evaluation: PACU Anesthesia Type: Regional and MAC Level of consciousness: awake and alert Pain management: pain level controlled Vital Signs Assessment: post-procedure vital signs reviewed and stable Respiratory status: spontaneous breathing, nonlabored ventilation and respiratory function stable Cardiovascular status: blood pressure returned to baseline and stable Postop Assessment: no apparent nausea or vomiting Anesthetic complications: no   No notable events documented.  Last Vitals:  Vitals:   01/16/23 1215 01/16/23 1230  BP: 110/64 (!) 105/56  Pulse: 75   Resp: 12   Temp:  36.7 C  SpO2: 98%     Last Pain:  Vitals:   01/16/23 1230  TempSrc:   PainSc: 0-No pain                 Lowella Curb

## 2023-01-16 NOTE — Interval H&P Note (Signed)
History and Physical Interval Note:  01/16/2023 9:48 AM  Peter [name]  has presented today for surgery, with the diagnosis of ESRD.  The various methods of treatment have been discussed with the patient and family. After consideration of risks, benefits and other options for treatment, the patient has consented to  Procedure(s): INSERTION OF RIGHT ARM ARTERIOVENOUS (AV) GORE-TEX GRAFT (Right) as a surgical intervention.  The patient's history has been reviewed, patient examined, no change in status, stable for surgery.  I have reviewed the patient's chart and labs.  Questions were answered to the patient's satisfaction.     Leonie Douglas

## 2023-01-16 NOTE — Anesthesia Procedure Notes (Signed)
Anesthesia Regional Block: Supraclavicular block   Pre-Anesthetic Checklist: , timeout performed,  Correct Patient, Correct Site, Correct Laterality,  Correct Procedure, Correct Position, site marked,  Risks and benefits discussed,  Surgical consent,  Pre-op evaluation,  At surgeon's request and post-op pain management  Laterality: Right  Prep: chloraprep       Needles:  Injection technique: Single-shot  Needle Type: Stimiplex     Needle Length: 9cm  Needle Gauge: 21     Additional Needles:   Procedures:,,,, ultrasound used (permanent image in chart),,    Narrative:  Start time: 01/16/2023 8:55 AM End time: 01/16/2023 9:00 AM Injection made incrementally with aspirations every 5 mL.  Performed by: Personally  Anesthesiologist: Lowella Curb, MD     Intercostobrachial performed right side with 10ml 0.5% Ropivacaine

## 2023-01-16 NOTE — Op Note (Signed)
DATE OF SERVICE: 01/16/2023  PATIENT:  Peter [name]  72 y.o. male  PRE-OPERATIVE DIAGNOSIS:  ESRD  POST-OPERATIVE DIAGNOSIS:  Same  PROCEDURE:   Right arm arteriovenous graft  SURGEON:  Surgeon(s) and Role:    * Leonie Douglas, MD - Primary  ASSISTANT: Loel Dubonnet, PA-C  An experienced assistant was required given the complexity of this procedure and the standard of surgical care. My assistant helped with exposure through counter tension, suctioning, ligation and retraction to better visualize the surgical field.  My assistant expedited sewing during the case by following my sutures. Wherever I use the term "we" in the report, my assistant actively helped me with that portion of the procedure.  ANESTHESIA:   regional and MAC  EBL: minimal  BLOOD ADMINISTERED:none  DRAINS: none   LOCAL MEDICATIONS USED:  NONE  SPECIMEN:  none  COUNTS: confirmed correct.  TOURNIQUET:  none  PATIENT DISPOSITION:  PACU - hemodynamically stable.   Delay start of Pharmacological VTE agent (>24hrs) due to surgical blood loss or risk of bleeding: no  INDICATION FOR PROCEDURE: Peter [name] is a 72 y.o. male with ESRD in need of permanent dialysis access. After careful discussion of risks, benefits, and alternatives the patient was offered right arm arteriovenous graft. The patient understood and wished to proceed.  OPERATIVE FINDINGS: successful arteriovenous graft. Doppler flow in right radial artery. Brisk doppler flow in outflow vein.   DESCRIPTION OF PROCEDURE: After identification of the patient in the pre-operative holding area, the patient was transferred to the operating room. The patient was positioned supine on the operating room table. Anesthesia was induced. The right arm was prepped and draped in standard fashion. A surgical pause was performed confirming correct patient, procedure, and operative location.  The right brachial artery was exposed using a longitudinal  incision in the distal arm just above the antecubital fossa.  Incision was carried down through subcutaneous tissue until the brachial sheath was encountered.  This was incised sharply.  The brachial artery was exposed and encircled with Silastic Vesseloops proximally and distally to the site of planned inflow.   The right brachial vein at the axilla was exposed using longitudinal incision just below the hairbearing area of the axilla.  Incision was carried down until the brachial sheath was encountered.  The brachial vein was identified, exposed, encircled with Silastic Vesseloops.   Using a curved, sheathed tunneling device, a 4-7 mm tapered Gore-Tex graft was tunneled subcutaneously and gentle arc across the biceps of the right arm.  Patient was then heparinized with 5000 units of IV heparin.   The brachial artery was clamped proximally and distally.  An anterior arteriotomy was made with an 11 blade.  This was extended with Potts scissors.  The 4 mm end of the Gore-Tex graft was spatulated and then anastomosed end-to-side to the brachial arteriotomy using continuous running suture of 6-0 Prolene.  The anastomosis was completed and hemostasis ensured.  The graft was clamped to restore perfusion to the hand.   The brachial vein was clamped proximally and distally.  An anterior venotomy was made with an 11 blade.  This was extended with Potts scissors.  The 7 mm end of the Gore-Tex graft was then anastomosed end to side to the brachial vein venotomy using continuous running suture of 6-0 Prolene.  Immediately prior to completion the anastomosis was de-aired and flushed.  Anastomosis was then completed.  Hemostasis was insured.   Doppler machine was brought onto the field to  interrogate the graft.  Doppler flow was noted in the radial artery.  About the arterial anastomosis flow was noted proximal and distal to the arterial anastomosis.  Distal to the venous anastomosis a Doppler bruit was heard.   Satisfied we ended the case here.   Surgical beds were irrigated copiously.  Hemostasis was again ensured in the surgical beds.  The wounds were closed in layers using 3-0 Vicryl and 4-0 Monocryl.  Clean bandages were applied.  Upon completion of the case instrument and sharps counts were confirmed correct. The patient was transferred to the PACU in good condition. I was present for all portions of the procedure.  FOLLOW UP PLAN: Assuming a normal postoperative course, we will see the patient on an as need basis. The graft can be used in 4 weeks.   Rande Brunt. Lenell Antu, MD [facility] Phone Number: [phone] 01/16/2023 11:38 AM

## 2023-01-16 NOTE — Transfer of Care (Signed)
Immediate Anesthesia Transfer of Care Note  Patient: Peter [name]  Procedure(s) Performed: INSERTION OF RIGHT ARM ARTERIOVENOUS (AV) GORE-TEX GRAFT (Right: Arm Upper)  Patient Location: PACU  Anesthesia Type:MAC combined with regional for post-op pain  Level of Consciousness: awake  Airway & Oxygen Therapy: Patient Spontanous Breathing  Post-op Assessment: Report given to RN and Post -op Vital signs reviewed and stable  Post vital signs: Reviewed and stable  Last Vitals:  Vitals Value Taken Time  BP 89/62 01/16/23 1200  Temp    Pulse 73 01/16/23 1203  Resp 8 01/16/23 1203  SpO2 97 % 01/16/23 1203  Vitals shown include unvalidated device data.  Last Pain:  Vitals:   01/16/23 0755  TempSrc:   PainSc: 0-No pain         Complications: No notable events documented.

## 2023-01-16 NOTE — Discharge Instructions (Signed)
[facility]  Discharge Instructions  AV Fistula or Graft Surgery for Dialysis Access  Please refer to the following instructions for your post-procedure care. Your surgeon or physician assistant will discuss any changes with you.  Activity  You may drive the day following your surgery, if you are comfortable and no longer taking prescription pain medication. Resume full activity as the soreness in your incision resolves.  Bathing/Showering  You may shower after you go home. Keep your incision dry for 48 hours. Do not soak in a bathtub, hot tub, or swim until the incision heals completely. You may not shower if you have a hemodialysis catheter.  Incision Care  Clean your incision with mild soap and water after 48 hours. Pat the area dry with a clean towel. You do not need a bandage unless otherwise instructed. Do not apply any ointments or creams to your incision. You may have skin glue on your incision. Do not peel it off. It will come off on its own in about one week. Your arm may swell a bit after surgery. To reduce swelling use pillows to elevate your arm so it is above your heart. Your doctor will tell you if you need to lightly wrap your arm with an ACE bandage.  Diet  Resume your normal diet. There are not special food restrictions following this procedure. In order to heal from your surgery, it is CRITICAL to get adequate nutrition. Your body requires vitamins, minerals, and protein. Vegetables are the best source of vitamins and minerals. Vegetables also provide the perfect balance of protein. Processed food has little nutritional value, so try to avoid this.  Medications  Resume taking all of your medications. If your incision is causing pain, you may take over-the counter pain relievers such as acetaminophen (Tylenol). If you were prescribed a stronger pain medication, please be aware these medications can cause nausea and constipation. Prevent  nausea by taking the medication with a snack or meal. Avoid constipation by drinking plenty of fluids and eating foods with high amount of fiber, such as fruits, vegetables, and grains.  Do not take Tylenol if you are taking prescription pain medications.  Follow up Your surgeon may want to see you in the office following your access surgery. If so, this will be arranged at the time of your surgery.  Please call us immediately for any of the following conditions:  Increased pain, redness, drainage (pus) from your incision site Fever of 101 degrees or higher Severe or worsening pain at your incision site Hand pain or numbness.  Reduce your risk of vascular disease:  Stop smoking. If you would like help, call [facility] at 1-800-QUIT-NOW ((563)770-1653) or [place] at [phone]  Manage your cholesterol Maintain a desired weight Control your diabetes Keep your blood pressure down  Dialysis  It will take several weeks to several months for your new dialysis access to be ready for use. Your surgeon will determine when it is okay to use it. Your nephrologist will continue to direct your dialysis. You can continue to use your Permcath until your new access is ready for use.   01/16/2023 Peter [name] [id number] 08-Mar-1951  Surgeon(s): Leonie Douglas, MD  Procedure(s): INSERTION OF RIGHT ARM ARTERIOVENOUS (AV) GORE-TEX GRAFT   May stick graft immediately   May stick graft on designated area only:   x Do not stick graft for 4 weeks    If you have any questions, please call the office  at [phone].

## 2023-01-17 ENCOUNTER — Encounter (HOSPITAL_COMMUNITY): Payer: Self-pay | Admitting: Vascular Surgery

## 2023-01-17 DIAGNOSIS — N2581 Secondary hyperparathyroidism of renal origin: Secondary | ICD-10-CM | POA: Diagnosis not present

## 2023-01-17 DIAGNOSIS — D689 Coagulation defect, unspecified: Secondary | ICD-10-CM | POA: Diagnosis not present

## 2023-01-17 DIAGNOSIS — N186 End stage renal disease: Secondary | ICD-10-CM | POA: Diagnosis not present

## 2023-01-17 DIAGNOSIS — Z992 Dependence on renal dialysis: Secondary | ICD-10-CM | POA: Diagnosis not present

## 2023-01-20 ENCOUNTER — Telehealth: Payer: Self-pay | Admitting: Physician Assistant

## 2023-01-20 DIAGNOSIS — Z992 Dependence on renal dialysis: Secondary | ICD-10-CM | POA: Diagnosis not present

## 2023-01-20 DIAGNOSIS — D689 Coagulation defect, unspecified: Secondary | ICD-10-CM | POA: Diagnosis not present

## 2023-01-20 DIAGNOSIS — N186 End stage renal disease: Secondary | ICD-10-CM | POA: Diagnosis not present

## 2023-01-20 DIAGNOSIS — N2581 Secondary hyperparathyroidism of renal origin: Secondary | ICD-10-CM | POA: Diagnosis not present

## 2023-01-20 NOTE — Telephone Encounter (Signed)
Patient Wife stated that they where informed by the MD that if patient is doing well and everything is healing well, a post op is not needed. Patient wife stated if they feel that they need to be seen they will call for an appointment.

## 2023-01-20 NOTE — Telephone Encounter (Signed)
-----   Message from [facility], [place] sent at 01/16/2023 11:53 AM EDT ----- S/p creation of right arm AVG, 4/18  Please arrange follow up with PA in 2-3 wks for incision check, on [place] office day. No studies needed

## 2023-01-21 DIAGNOSIS — D6869 Other thrombophilia: Secondary | ICD-10-CM | POA: Diagnosis not present

## 2023-01-21 DIAGNOSIS — I4891 Unspecified atrial fibrillation: Secondary | ICD-10-CM | POA: Diagnosis not present

## 2023-01-22 DIAGNOSIS — N2581 Secondary hyperparathyroidism of renal origin: Secondary | ICD-10-CM | POA: Diagnosis not present

## 2023-01-22 DIAGNOSIS — Z992 Dependence on renal dialysis: Secondary | ICD-10-CM | POA: Diagnosis not present

## 2023-01-22 DIAGNOSIS — N186 End stage renal disease: Secondary | ICD-10-CM | POA: Diagnosis not present

## 2023-01-22 DIAGNOSIS — D689 Coagulation defect, unspecified: Secondary | ICD-10-CM | POA: Diagnosis not present

## 2023-01-24 DIAGNOSIS — N186 End stage renal disease: Secondary | ICD-10-CM | POA: Diagnosis not present

## 2023-01-24 DIAGNOSIS — D689 Coagulation defect, unspecified: Secondary | ICD-10-CM | POA: Diagnosis not present

## 2023-01-24 DIAGNOSIS — N2581 Secondary hyperparathyroidism of renal origin: Secondary | ICD-10-CM | POA: Diagnosis not present

## 2023-01-24 DIAGNOSIS — Z992 Dependence on renal dialysis: Secondary | ICD-10-CM | POA: Diagnosis not present

## 2023-01-27 DIAGNOSIS — Z992 Dependence on renal dialysis: Secondary | ICD-10-CM | POA: Diagnosis not present

## 2023-01-27 DIAGNOSIS — N186 End stage renal disease: Secondary | ICD-10-CM | POA: Diagnosis not present

## 2023-01-27 DIAGNOSIS — D689 Coagulation defect, unspecified: Secondary | ICD-10-CM | POA: Diagnosis not present

## 2023-01-27 DIAGNOSIS — N2581 Secondary hyperparathyroidism of renal origin: Secondary | ICD-10-CM | POA: Diagnosis not present

## 2023-01-28 DIAGNOSIS — E1129 Type 2 diabetes mellitus with other diabetic kidney complication: Secondary | ICD-10-CM | POA: Diagnosis not present

## 2023-01-28 DIAGNOSIS — Z992 Dependence on renal dialysis: Secondary | ICD-10-CM | POA: Diagnosis not present

## 2023-01-28 DIAGNOSIS — N186 End stage renal disease: Secondary | ICD-10-CM | POA: Diagnosis not present

## 2023-01-29 DIAGNOSIS — D689 Coagulation defect, unspecified: Secondary | ICD-10-CM | POA: Diagnosis not present

## 2023-01-29 DIAGNOSIS — N2581 Secondary hyperparathyroidism of renal origin: Secondary | ICD-10-CM | POA: Diagnosis not present

## 2023-01-29 DIAGNOSIS — Z992 Dependence on renal dialysis: Secondary | ICD-10-CM | POA: Diagnosis not present

## 2023-01-29 DIAGNOSIS — N186 End stage renal disease: Secondary | ICD-10-CM | POA: Diagnosis not present

## 2023-01-31 DIAGNOSIS — Z992 Dependence on renal dialysis: Secondary | ICD-10-CM | POA: Diagnosis not present

## 2023-01-31 DIAGNOSIS — N186 End stage renal disease: Secondary | ICD-10-CM | POA: Diagnosis not present

## 2023-01-31 DIAGNOSIS — N2581 Secondary hyperparathyroidism of renal origin: Secondary | ICD-10-CM | POA: Diagnosis not present

## 2023-01-31 DIAGNOSIS — D689 Coagulation defect, unspecified: Secondary | ICD-10-CM | POA: Diagnosis not present

## 2023-02-03 DIAGNOSIS — Z992 Dependence on renal dialysis: Secondary | ICD-10-CM | POA: Diagnosis not present

## 2023-02-03 DIAGNOSIS — N2581 Secondary hyperparathyroidism of renal origin: Secondary | ICD-10-CM | POA: Diagnosis not present

## 2023-02-03 DIAGNOSIS — D689 Coagulation defect, unspecified: Secondary | ICD-10-CM | POA: Diagnosis not present

## 2023-02-03 DIAGNOSIS — N186 End stage renal disease: Secondary | ICD-10-CM | POA: Diagnosis not present

## 2023-02-05 DIAGNOSIS — N186 End stage renal disease: Secondary | ICD-10-CM | POA: Diagnosis not present

## 2023-02-05 DIAGNOSIS — N2581 Secondary hyperparathyroidism of renal origin: Secondary | ICD-10-CM | POA: Diagnosis not present

## 2023-02-05 DIAGNOSIS — D689 Coagulation defect, unspecified: Secondary | ICD-10-CM | POA: Diagnosis not present

## 2023-02-05 DIAGNOSIS — Z992 Dependence on renal dialysis: Secondary | ICD-10-CM | POA: Diagnosis not present

## 2023-02-07 DIAGNOSIS — N2581 Secondary hyperparathyroidism of renal origin: Secondary | ICD-10-CM | POA: Diagnosis not present

## 2023-02-07 DIAGNOSIS — N186 End stage renal disease: Secondary | ICD-10-CM | POA: Diagnosis not present

## 2023-02-07 DIAGNOSIS — D689 Coagulation defect, unspecified: Secondary | ICD-10-CM | POA: Diagnosis not present

## 2023-02-07 DIAGNOSIS — Z992 Dependence on renal dialysis: Secondary | ICD-10-CM | POA: Diagnosis not present

## 2023-02-10 DIAGNOSIS — D689 Coagulation defect, unspecified: Secondary | ICD-10-CM | POA: Diagnosis not present

## 2023-02-10 DIAGNOSIS — N186 End stage renal disease: Secondary | ICD-10-CM | POA: Diagnosis not present

## 2023-02-10 DIAGNOSIS — N2581 Secondary hyperparathyroidism of renal origin: Secondary | ICD-10-CM | POA: Diagnosis not present

## 2023-02-10 DIAGNOSIS — Z992 Dependence on renal dialysis: Secondary | ICD-10-CM | POA: Diagnosis not present

## 2023-02-12 DIAGNOSIS — N2581 Secondary hyperparathyroidism of renal origin: Secondary | ICD-10-CM | POA: Diagnosis not present

## 2023-02-12 DIAGNOSIS — D689 Coagulation defect, unspecified: Secondary | ICD-10-CM | POA: Diagnosis not present

## 2023-02-12 DIAGNOSIS — Z992 Dependence on renal dialysis: Secondary | ICD-10-CM | POA: Diagnosis not present

## 2023-02-12 DIAGNOSIS — N186 End stage renal disease: Secondary | ICD-10-CM | POA: Diagnosis not present

## 2023-02-14 DIAGNOSIS — N2581 Secondary hyperparathyroidism of renal origin: Secondary | ICD-10-CM | POA: Diagnosis not present

## 2023-02-14 DIAGNOSIS — Z992 Dependence on renal dialysis: Secondary | ICD-10-CM | POA: Diagnosis not present

## 2023-02-14 DIAGNOSIS — N186 End stage renal disease: Secondary | ICD-10-CM | POA: Diagnosis not present

## 2023-02-14 DIAGNOSIS — D689 Coagulation defect, unspecified: Secondary | ICD-10-CM | POA: Diagnosis not present

## 2023-02-17 DIAGNOSIS — N2581 Secondary hyperparathyroidism of renal origin: Secondary | ICD-10-CM | POA: Diagnosis not present

## 2023-02-17 DIAGNOSIS — D689 Coagulation defect, unspecified: Secondary | ICD-10-CM | POA: Diagnosis not present

## 2023-02-17 DIAGNOSIS — Z992 Dependence on renal dialysis: Secondary | ICD-10-CM | POA: Diagnosis not present

## 2023-02-17 DIAGNOSIS — N186 End stage renal disease: Secondary | ICD-10-CM | POA: Diagnosis not present

## 2023-02-19 DIAGNOSIS — Z992 Dependence on renal dialysis: Secondary | ICD-10-CM | POA: Diagnosis not present

## 2023-02-19 DIAGNOSIS — N186 End stage renal disease: Secondary | ICD-10-CM | POA: Diagnosis not present

## 2023-02-19 DIAGNOSIS — D689 Coagulation defect, unspecified: Secondary | ICD-10-CM | POA: Diagnosis not present

## 2023-02-19 DIAGNOSIS — N2581 Secondary hyperparathyroidism of renal origin: Secondary | ICD-10-CM | POA: Diagnosis not present

## 2023-02-20 DIAGNOSIS — N186 End stage renal disease: Secondary | ICD-10-CM | POA: Diagnosis not present

## 2023-02-20 DIAGNOSIS — Z992 Dependence on renal dialysis: Secondary | ICD-10-CM | POA: Diagnosis not present

## 2023-02-20 DIAGNOSIS — Z452 Encounter for adjustment and management of vascular access device: Secondary | ICD-10-CM | POA: Diagnosis not present

## 2023-02-21 DIAGNOSIS — N186 End stage renal disease: Secondary | ICD-10-CM | POA: Diagnosis not present

## 2023-02-21 DIAGNOSIS — D689 Coagulation defect, unspecified: Secondary | ICD-10-CM | POA: Diagnosis not present

## 2023-02-21 DIAGNOSIS — N2581 Secondary hyperparathyroidism of renal origin: Secondary | ICD-10-CM | POA: Diagnosis not present

## 2023-02-21 DIAGNOSIS — Z992 Dependence on renal dialysis: Secondary | ICD-10-CM | POA: Diagnosis not present

## 2023-02-24 DIAGNOSIS — D689 Coagulation defect, unspecified: Secondary | ICD-10-CM | POA: Diagnosis not present

## 2023-02-24 DIAGNOSIS — Z992 Dependence on renal dialysis: Secondary | ICD-10-CM | POA: Diagnosis not present

## 2023-02-24 DIAGNOSIS — N186 End stage renal disease: Secondary | ICD-10-CM | POA: Diagnosis not present

## 2023-02-24 DIAGNOSIS — N2581 Secondary hyperparathyroidism of renal origin: Secondary | ICD-10-CM | POA: Diagnosis not present

## 2023-02-24 DIAGNOSIS — R001 Bradycardia, unspecified: Secondary | ICD-10-CM | POA: Insufficient documentation

## 2023-02-24 DIAGNOSIS — I5032 Chronic diastolic (congestive) heart failure: Secondary | ICD-10-CM | POA: Insufficient documentation

## 2023-02-24 NOTE — Progress Notes (Unsigned)
Cardiology Office Note:   Date:  02/24/2023  ID:  Peter [name], DOB 09-06-1951, MRN [id number]  History of Present Illness:   Peter [name] is a 72 y.o. male who presents for evaluation of atrial fib.  ***.  We saw the patient in 2019 in consultation for evaluation of syncope.   He was bradycardic and beta blocker was stopped.  BP was low and he was thought to have some dehydration.   *** .    ROS: ***  Studies Reviewed:    EKG:  ***  ***  Risk Assessment/Calculations:   {Does this patient have ATRIAL FIBRILLATION?[phone]} No BP recorded.  {Refresh Note OR Click here to enter BP  :1}***        Physical Exam:   VS:  There were no vitals taken for this visit.   Wt Readings from Last 3 Encounters:  01/16/23 228 lb (103.4 kg)  01/07/23 218 lb (98.9 kg)  03/29/21 234 lb 8 oz (106.4 kg)     GEN: Well nourished, well developed in no acute distress NECK: No JVD; No carotid bruits CARDIAC: ***RRR, no murmurs, rubs, gallops RESPIRATORY:  Clear to auscultation without rales, wheezing or rhonchi  ABDOMEN: Soft, non-tender, non-distended EXTREMITIES:  No edema; No deformity   ASSESSMENT AND PLAN:   Syncope mild elevation in troponin:  ***   -Presented to [facility] on 05/29/2018 s/p syncopal episode after working outside.  At the scene, he was found to be hypotensive and bradycardic.  EKG revealed sinus bradycardia with first-degree AV block and PVC.  Syncope in the setting of dehydration?  Versus bradycardia? -Telemetry review with no arrhythmias however, continues to be bradycardic in the 40s to 50s with first-degree AV block and occasional PVC -Atenolol currently on hold.  Patient was also on home doxazosin 2 mg, Lasix 80 mg, hydralazine 100 mg and amlodipine 10 mg -Troponin, only elevated at 0.04, 0.05 with no complaints of chest pain or other ACS symptoms.  Elevation likely in the setting of severely elevated creatinine at 8.18.  He is currently followed by nephrology and primary  care physician and he is not currently on hemodialysis.  -Echocardiogram, pending -Low suspicion for ACS.  Will continue to trend troponin, if continues to be elevated or has episodes of chest pain, will consider ischemic evaluation. -Continue to hold atenolol for bradycardia and AV block -Consider outpatient monitor at discharge   Chronic kidney disease stage V:  ***   -Followed by nephrology and primary care physician with known creatinine greater than 8.0 -Has AV graft in place however is not currently on hemodialysis -States he has an upcoming nephrology appointment next Tuesday and was last seen by his primary care physician approximately 2 weeks ago -Creatinine, 8.18 today -Nephrology consult?   Essential hypertension:  ***   -Stable, 143/59, 149/66, 144/58 -On home amlodipine 10 mg, atenolol 100 mg, doxazosin 2 mg, Lasix 80 mg, hydralazine 100 mg -Beta-blocker on hold   History of mitral prolapse per echocardiogram: ***   -Per echocardiogram from 10/24/2015, LV EF 60 to 65% with G2 DD and mild mitral regurgitation -Per last office visit, asymptomatic -Echocardiogram ordered this admission   Bradycardia:  ***   -Heart rate remains in the 40s to 50s, asymptomatic -No pauses or arrhythmias per telemetry review -Beta-blocker currently on hold   History of grade 2 diastolic dysfunction:  ***   -No current CHF symptoms -On 80 mg Lasix daily for chronic LE swelling -Echocardiogram, pending -Last echocardiogram from 2017 with  normal LV function and G2 DD with mild mitral regurgitation      {Are you ordering a CV Procedure (e.g. stress test, cath, DCCV, TEE, etc)?   Press F2        :[id number]}   Signed, Rollene Rotunda, MD

## 2023-02-26 ENCOUNTER — Ambulatory Visit: Payer: Medicare Other | Attending: Cardiology | Admitting: Cardiology

## 2023-02-26 ENCOUNTER — Encounter: Payer: Self-pay | Admitting: Cardiology

## 2023-02-26 ENCOUNTER — Ambulatory Visit (INDEPENDENT_AMBULATORY_CARE_PROVIDER_SITE_OTHER): Payer: Medicare Other

## 2023-02-26 VITALS — BP 124/68 | HR 70 | Ht 66.0 in | Wt 213.4 lb

## 2023-02-26 DIAGNOSIS — I10 Essential (primary) hypertension: Secondary | ICD-10-CM

## 2023-02-26 DIAGNOSIS — I5032 Chronic diastolic (congestive) heart failure: Secondary | ICD-10-CM

## 2023-02-26 DIAGNOSIS — I341 Nonrheumatic mitral (valve) prolapse: Secondary | ICD-10-CM | POA: Diagnosis not present

## 2023-02-26 DIAGNOSIS — N2581 Secondary hyperparathyroidism of renal origin: Secondary | ICD-10-CM | POA: Diagnosis not present

## 2023-02-26 DIAGNOSIS — I4891 Unspecified atrial fibrillation: Secondary | ICD-10-CM

## 2023-02-26 DIAGNOSIS — R001 Bradycardia, unspecified: Secondary | ICD-10-CM | POA: Diagnosis not present

## 2023-02-26 DIAGNOSIS — Z992 Dependence on renal dialysis: Secondary | ICD-10-CM | POA: Diagnosis not present

## 2023-02-26 DIAGNOSIS — R55 Syncope and collapse: Secondary | ICD-10-CM | POA: Diagnosis not present

## 2023-02-26 DIAGNOSIS — D689 Coagulation defect, unspecified: Secondary | ICD-10-CM | POA: Diagnosis not present

## 2023-02-26 DIAGNOSIS — N186 End stage renal disease: Secondary | ICD-10-CM | POA: Diagnosis not present

## 2023-02-26 MED ORDER — APIXABAN 5 MG PO TABS: 5.0000 mg | ORAL_TABLET | Freq: Two times a day (BID) | ORAL | 11 refills | Status: DC

## 2023-02-26 MED ORDER — APIXABAN 5 MG PO TABS: 5.0000 mg | ORAL_TABLET | Freq: Two times a day (BID) | ORAL | 0 refills | Status: DC

## 2023-02-26 NOTE — Progress Notes (Unsigned)
Enrolled for [employer] to mail a ZIO XT long term holter monitor to the patients address on file.  

## 2023-02-26 NOTE — Patient Instructions (Signed)
Medication Instructions:  START eliquis 5mg  twice daily (every 12 hours)  *If you need a refill on your cardiac medications before your next appointment, please call your pharmacy*  Testing/Procedures: ZIO XT- Long Term Monitor Instructions  Your physician has requested you wear a ZIO patch monitor for 3 days.  This is a single patch monitor. [employer] supplies one patch monitor per enrollment. Additional stickers are not available. Please do not apply patch if you will be having a Nuclear Stress Test,  Echocardiogram, Cardiac CT, MRI, or Chest Xray during the period you would be wearing the  monitor. The patch cannot be worn during these tests. You cannot remove and re-apply the  ZIO XT patch monitor.  Your ZIO patch monitor will be mailed 3 day USPS to your address on file. It may take 3-5 days  to receive your monitor after you have been enrolled.  Once you have received your monitor, please review the enclosed instructions. Your monitor  has already been registered assigning a specific monitor serial # to you.  Billing and Patient Assistance Program Information  We have supplied [employer] with any of your insurance information on file for billing purposes. [employer] offers a sliding scale Patient Assistance Program for patients that do not have  insurance, or whose insurance does not completely cover the cost of the ZIO monitor.  You must apply for the Patient Assistance Program to qualify for this discounted rate.  To apply, please call [employer] at [phone], select option 4, select option 2, ask to apply for  Patient Assistance Program. Meredeth Ide will ask your household income, and how many people  are in your household. They will quote your out-of-pocket cost based on that information.  [employer] will also be able to set up a 54-month, interest-free payment plan if needed.  Applying the monitor   Shave hair from upper left chest.  Hold abrader disc by orange tab. Rub abrader in 40  strokes over the upper left chest as  indicated in your monitor instructions.  Clean area with 4 enclosed alcohol pads. Let dry.  Apply patch as indicated in monitor instructions. Patch will be placed under collarbone on left  side of chest with arrow pointing upward.  Rub patch adhesive wings for 2 minutes. Remove white label marked "1". Remove the white  label marked "2". Rub patch adhesive wings for 2 additional minutes.  While looking in a mirror, press and release button in center of patch. A small green light will  flash 3-4 times. This will be your only indicator that the monitor has been turned on.  Do not shower for the first 24 hours. You may shower after the first 24 hours.  Press the button if you feel a symptom. You will hear a small click. Record Date, Time and  Symptom in the Patient Logbook.  When you are ready to remove the patch, follow instructions on the last 2 pages of Patient  Logbook. Stick patch monitor onto the last page of Patient Logbook.  Place Patient Logbook in the blue and white box. Use locking tab on box and tape box closed  securely. The blue and white box has prepaid postage on it. Please place it in the mailbox as  soon as possible. Your physician should have your test results approximately 7 days after the  monitor has been mailed back to [facility].  Call [facility] Customer Care at [phone] if you have questions regarding  your ZIO XT patch monitor. Call them immediately  if you see an orange light blinking on your  monitor.  If your monitor falls off in less than 4 days, contact our Monitor department at [phone].  If your monitor becomes loose or falls off after 4 days call [employer] at [phone] for  suggestions on securing your monitor    Follow-Up: At [facility], you and your health needs are our priority.  As part of our continuing mission to provide you with exceptional heart care, we have created designated  Provider Care Teams.  These Care Teams include your primary Cardiologist (physician) and Advanced Practice Providers (APPs -  Physician Assistants and Nurse Practitioners) who all work together to provide you with the care you need, when you need it.  We recommend signing up for the patient portal called "MyChart".  Sign up information is provided on this After Visit Summary.  MyChart is used to connect with patients for Virtual Visits (Telemedicine).  Patients are able to view lab/test results, encounter notes, upcoming appointments, etc.  Non-urgent messages can be sent to your provider as well.   To learn more about what you can do with MyChart, go to [email or web address].    Your next appointment:    2-3 weeks with Dr. Antoine Poche

## 2023-02-28 DIAGNOSIS — Z992 Dependence on renal dialysis: Secondary | ICD-10-CM | POA: Diagnosis not present

## 2023-02-28 DIAGNOSIS — E1129 Type 2 diabetes mellitus with other diabetic kidney complication: Secondary | ICD-10-CM | POA: Diagnosis not present

## 2023-02-28 DIAGNOSIS — D689 Coagulation defect, unspecified: Secondary | ICD-10-CM | POA: Diagnosis not present

## 2023-02-28 DIAGNOSIS — N186 End stage renal disease: Secondary | ICD-10-CM | POA: Diagnosis not present

## 2023-02-28 DIAGNOSIS — N2581 Secondary hyperparathyroidism of renal origin: Secondary | ICD-10-CM | POA: Diagnosis not present

## 2023-03-03 DIAGNOSIS — N2581 Secondary hyperparathyroidism of renal origin: Secondary | ICD-10-CM | POA: Diagnosis not present

## 2023-03-03 DIAGNOSIS — D689 Coagulation defect, unspecified: Secondary | ICD-10-CM | POA: Diagnosis not present

## 2023-03-03 DIAGNOSIS — Z992 Dependence on renal dialysis: Secondary | ICD-10-CM | POA: Diagnosis not present

## 2023-03-03 DIAGNOSIS — N186 End stage renal disease: Secondary | ICD-10-CM | POA: Diagnosis not present

## 2023-03-04 DIAGNOSIS — I4891 Unspecified atrial fibrillation: Secondary | ICD-10-CM | POA: Diagnosis not present

## 2023-03-05 DIAGNOSIS — D689 Coagulation defect, unspecified: Secondary | ICD-10-CM | POA: Diagnosis not present

## 2023-03-05 DIAGNOSIS — Z992 Dependence on renal dialysis: Secondary | ICD-10-CM | POA: Diagnosis not present

## 2023-03-05 DIAGNOSIS — N2581 Secondary hyperparathyroidism of renal origin: Secondary | ICD-10-CM | POA: Diagnosis not present

## 2023-03-05 DIAGNOSIS — N186 End stage renal disease: Secondary | ICD-10-CM | POA: Diagnosis not present

## 2023-03-07 DIAGNOSIS — N186 End stage renal disease: Secondary | ICD-10-CM | POA: Diagnosis not present

## 2023-03-07 DIAGNOSIS — Z992 Dependence on renal dialysis: Secondary | ICD-10-CM | POA: Diagnosis not present

## 2023-03-07 DIAGNOSIS — D689 Coagulation defect, unspecified: Secondary | ICD-10-CM | POA: Diagnosis not present

## 2023-03-07 DIAGNOSIS — N2581 Secondary hyperparathyroidism of renal origin: Secondary | ICD-10-CM | POA: Diagnosis not present

## 2023-03-10 DIAGNOSIS — N2581 Secondary hyperparathyroidism of renal origin: Secondary | ICD-10-CM | POA: Diagnosis not present

## 2023-03-10 DIAGNOSIS — Z992 Dependence on renal dialysis: Secondary | ICD-10-CM | POA: Diagnosis not present

## 2023-03-10 DIAGNOSIS — D689 Coagulation defect, unspecified: Secondary | ICD-10-CM | POA: Diagnosis not present

## 2023-03-10 DIAGNOSIS — N186 End stage renal disease: Secondary | ICD-10-CM | POA: Diagnosis not present

## 2023-03-12 DIAGNOSIS — D689 Coagulation defect, unspecified: Secondary | ICD-10-CM | POA: Diagnosis not present

## 2023-03-12 DIAGNOSIS — N2581 Secondary hyperparathyroidism of renal origin: Secondary | ICD-10-CM | POA: Diagnosis not present

## 2023-03-12 DIAGNOSIS — N186 End stage renal disease: Secondary | ICD-10-CM | POA: Diagnosis not present

## 2023-03-12 DIAGNOSIS — Z992 Dependence on renal dialysis: Secondary | ICD-10-CM | POA: Diagnosis not present

## 2023-03-13 DIAGNOSIS — I4891 Unspecified atrial fibrillation: Secondary | ICD-10-CM | POA: Diagnosis not present

## 2023-03-14 DIAGNOSIS — D689 Coagulation defect, unspecified: Secondary | ICD-10-CM | POA: Diagnosis not present

## 2023-03-14 DIAGNOSIS — Z992 Dependence on renal dialysis: Secondary | ICD-10-CM | POA: Diagnosis not present

## 2023-03-14 DIAGNOSIS — N2581 Secondary hyperparathyroidism of renal origin: Secondary | ICD-10-CM | POA: Diagnosis not present

## 2023-03-14 DIAGNOSIS — N186 End stage renal disease: Secondary | ICD-10-CM | POA: Diagnosis not present

## 2023-03-17 DIAGNOSIS — N2581 Secondary hyperparathyroidism of renal origin: Secondary | ICD-10-CM | POA: Diagnosis not present

## 2023-03-17 DIAGNOSIS — Z992 Dependence on renal dialysis: Secondary | ICD-10-CM | POA: Diagnosis not present

## 2023-03-17 DIAGNOSIS — D689 Coagulation defect, unspecified: Secondary | ICD-10-CM | POA: Diagnosis not present

## 2023-03-17 DIAGNOSIS — N186 End stage renal disease: Secondary | ICD-10-CM | POA: Diagnosis not present

## 2023-03-18 DIAGNOSIS — I480 Paroxysmal atrial fibrillation: Secondary | ICD-10-CM | POA: Insufficient documentation

## 2023-03-18 NOTE — Progress Notes (Unsigned)
  Cardiology Office Note:   Date:  03/20/2023  ID:  Peter [name], Peter [name] 02-10-1951, MRN [id number] PCP: [name], MD  [facility] Providers Cardiologist:  None {  History of Present Illness:   Peter [name] is a 72 y.o. male who presents for evaluation atrial fibrillation.  He was noted to have this surgery for right arm AV graft.  He had previously been seen by Dr. Mayford Knife.  He has obstructive sleep apnea, mitral valve prolapse and mild mitral regurgitation.  He was referred for evaluation of atrial fibrillation.  He did have an echocardiogram ordered by [name], MD and I was able to review these results.  EF was normal.  The EF was 82%.  There was normal mitral valve with no prolapse or regurgitation.  He did have some mild left atrial enlargement.  He wore a monitor and he does have continuous atrial fibs.  He had 9 beats of nonsustained VT.  He has felt well.  He goes to dialysis.  He is having issues with colon cancer.  Rapid heart rates during dialysis.  He has no hypotension.  He is not tolerating anticoagulation.  He says he feels better on this feeling less drained when he has dialysis.  He has had no new shortness of breath, PND or orthopnea.  ROS: As stated in the HPI and negative for all other systems.  Studies Reviewed:    EKG:     Risk Assessment/Calculations:    CHA2DS2-VASc Score = 2   This indicates a 2.2% annual risk of stroke. The patient's score is based upon: CHF History: 0 HTN History: 1 Diabetes History: 0 Stroke History: 0 Vascular Disease History: 0 Age Score: 1 Gender Score: 0  Physical Exam:   VS:  BP 132/68 (BP Location: Left Arm, Patient Position: Sitting, Cuff Size: Large)   Pulse 76   Ht 5\' 6"  (1.676 m)   Wt 219 lb 3.2 oz (99.4 kg)   SpO2 98%   BMI 35.38 kg/m    Wt Readings from Last 3 Encounters:  03/20/23 219 lb 3.2 oz (99.4 kg)  02/26/23 213 lb 6.4 oz (96.8 kg)  01/16/23 228 lb (103.4 kg)     GEN: Well nourished,  well developed in no acute distress NECK: No JVD; No carotid bruits CARDIAC: RRR, 3 out of 6 holosystolic murmur heard at the apex no diastolic murmurs, rubs, gallops RESPIRATORY:  Clear to auscultation without rales, wheezing or rhonchi  ABDOMEN: Soft, non-tender, non-distended EXTREMITIES:  No edema; No deformity dialysis fistula with thrill and bruit  ASSESSMENT AND PLAN:   ATRIAL FIB: Patient has atrial fibrillation of unknown duration.  He has good rate control.  He is asymptomatic.  He tolerates anticoagulation.  He is not particularly interested in considering ablation and I do not see an indication for cardioversion.  I will pursue rate control and anticoagulation  MVP: Recent echocardiogram did not suggest severe mitral regurgitation although we will repeat an echocardiogram in 1 year.  He has a loud murmur some of which could be related to his dialysis fistula but I still suspect some mitral regurgitation.     Follow up me in 6 months  Signed, Rollene Rotunda, MD

## 2023-03-19 DIAGNOSIS — N2581 Secondary hyperparathyroidism of renal origin: Secondary | ICD-10-CM | POA: Diagnosis not present

## 2023-03-19 DIAGNOSIS — N186 End stage renal disease: Secondary | ICD-10-CM | POA: Diagnosis not present

## 2023-03-19 DIAGNOSIS — D689 Coagulation defect, unspecified: Secondary | ICD-10-CM | POA: Diagnosis not present

## 2023-03-19 DIAGNOSIS — Z992 Dependence on renal dialysis: Secondary | ICD-10-CM | POA: Diagnosis not present

## 2023-03-20 ENCOUNTER — Ambulatory Visit: Payer: Medicare Other | Attending: Cardiology | Admitting: Cardiology

## 2023-03-20 ENCOUNTER — Encounter: Payer: Self-pay | Admitting: Cardiology

## 2023-03-20 VITALS — BP 132/68 | HR 76 | Ht 66.0 in | Wt 219.2 lb

## 2023-03-20 DIAGNOSIS — I341 Nonrheumatic mitral (valve) prolapse: Secondary | ICD-10-CM

## 2023-03-20 DIAGNOSIS — I480 Paroxysmal atrial fibrillation: Secondary | ICD-10-CM

## 2023-03-20 NOTE — Patient Instructions (Signed)
Medication Instructions:  Your physician recommends that you continue on your current medications as directed. Please refer to the Current Medication list given to you today.  *If you need a refill on your cardiac medications before your next appointment, please call your pharmacy*  Follow-Up: At [facility], you and your health needs are our priority.  As part of our continuing mission to provide you with exceptional heart care, we have created designated Provider Care Teams.  These Care Teams include your primary Cardiologist (physician) and Advanced Practice Providers (APPs -  Physician Assistants and Nurse Practitioners) who all work together to provide you with the care you need, when you need it.  Your next appointment:   6 month(s)  Provider:   Dr. Antoine Poche

## 2023-03-21 DIAGNOSIS — D689 Coagulation defect, unspecified: Secondary | ICD-10-CM | POA: Diagnosis not present

## 2023-03-21 DIAGNOSIS — N186 End stage renal disease: Secondary | ICD-10-CM | POA: Diagnosis not present

## 2023-03-21 DIAGNOSIS — Z992 Dependence on renal dialysis: Secondary | ICD-10-CM | POA: Diagnosis not present

## 2023-03-21 DIAGNOSIS — N2581 Secondary hyperparathyroidism of renal origin: Secondary | ICD-10-CM | POA: Diagnosis not present

## 2023-03-24 DIAGNOSIS — D689 Coagulation defect, unspecified: Secondary | ICD-10-CM | POA: Diagnosis not present

## 2023-03-24 DIAGNOSIS — Z992 Dependence on renal dialysis: Secondary | ICD-10-CM | POA: Diagnosis not present

## 2023-03-24 DIAGNOSIS — N186 End stage renal disease: Secondary | ICD-10-CM | POA: Diagnosis not present

## 2023-03-24 DIAGNOSIS — N2581 Secondary hyperparathyroidism of renal origin: Secondary | ICD-10-CM | POA: Diagnosis not present

## 2023-03-26 DIAGNOSIS — Z992 Dependence on renal dialysis: Secondary | ICD-10-CM | POA: Diagnosis not present

## 2023-03-26 DIAGNOSIS — D689 Coagulation defect, unspecified: Secondary | ICD-10-CM | POA: Diagnosis not present

## 2023-03-26 DIAGNOSIS — N2581 Secondary hyperparathyroidism of renal origin: Secondary | ICD-10-CM | POA: Diagnosis not present

## 2023-03-26 DIAGNOSIS — N186 End stage renal disease: Secondary | ICD-10-CM | POA: Diagnosis not present

## 2023-03-28 DIAGNOSIS — Z992 Dependence on renal dialysis: Secondary | ICD-10-CM | POA: Diagnosis not present

## 2023-03-28 DIAGNOSIS — N186 End stage renal disease: Secondary | ICD-10-CM | POA: Diagnosis not present

## 2023-03-28 DIAGNOSIS — N2581 Secondary hyperparathyroidism of renal origin: Secondary | ICD-10-CM | POA: Diagnosis not present

## 2023-03-28 DIAGNOSIS — D689 Coagulation defect, unspecified: Secondary | ICD-10-CM | POA: Diagnosis not present

## 2023-03-30 DIAGNOSIS — N186 End stage renal disease: Secondary | ICD-10-CM | POA: Diagnosis not present

## 2023-03-30 DIAGNOSIS — E1129 Type 2 diabetes mellitus with other diabetic kidney complication: Secondary | ICD-10-CM | POA: Diagnosis not present

## 2023-03-30 DIAGNOSIS — Z992 Dependence on renal dialysis: Secondary | ICD-10-CM | POA: Diagnosis not present

## 2023-04-01 ENCOUNTER — Encounter (HOSPITAL_COMMUNITY): Payer: Self-pay

## 2023-04-01 ENCOUNTER — Emergency Department (HOSPITAL_COMMUNITY): Payer: Medicare Other

## 2023-04-01 ENCOUNTER — Other Ambulatory Visit: Payer: Self-pay

## 2023-04-01 ENCOUNTER — Inpatient Hospital Stay (HOSPITAL_COMMUNITY)
Admission: EM | Admit: 2023-04-01 | Discharge: 2023-05-01 | DRG: 853 | Disposition: E | Payer: Medicare Other | Attending: Internal Medicine | Admitting: Internal Medicine

## 2023-04-01 DIAGNOSIS — I700 Atherosclerosis of aorta: Secondary | ICD-10-CM | POA: Diagnosis not present

## 2023-04-01 DIAGNOSIS — E669 Obesity, unspecified: Secondary | ICD-10-CM | POA: Diagnosis present

## 2023-04-01 DIAGNOSIS — S46811A Strain of other muscles, fascia and tendons at shoulder and upper arm level, right arm, initial encounter: Secondary | ICD-10-CM | POA: Diagnosis not present

## 2023-04-01 DIAGNOSIS — M00011 Staphylococcal arthritis, right shoulder: Secondary | ICD-10-CM | POA: Diagnosis not present

## 2023-04-01 DIAGNOSIS — M75122 Complete rotator cuff tear or rupture of left shoulder, not specified as traumatic: Secondary | ICD-10-CM | POA: Diagnosis not present

## 2023-04-01 DIAGNOSIS — Z66 Do not resuscitate: Secondary | ICD-10-CM | POA: Diagnosis not present

## 2023-04-01 DIAGNOSIS — I38 Endocarditis, valve unspecified: Secondary | ICD-10-CM | POA: Diagnosis not present

## 2023-04-01 DIAGNOSIS — Z992 Dependence on renal dialysis: Secondary | ICD-10-CM | POA: Diagnosis not present

## 2023-04-01 DIAGNOSIS — A4101 Sepsis due to Methicillin susceptible Staphylococcus aureus: Principal | ICD-10-CM | POA: Diagnosis present

## 2023-04-01 DIAGNOSIS — R509 Fever, unspecified: Secondary | ICD-10-CM | POA: Diagnosis not present

## 2023-04-01 DIAGNOSIS — Z880 Allergy status to penicillin: Secondary | ICD-10-CM

## 2023-04-01 DIAGNOSIS — I2510 Atherosclerotic heart disease of native coronary artery without angina pectoris: Secondary | ICD-10-CM | POA: Diagnosis not present

## 2023-04-01 DIAGNOSIS — M25512 Pain in left shoulder: Secondary | ICD-10-CM | POA: Diagnosis not present

## 2023-04-01 DIAGNOSIS — E860 Dehydration: Secondary | ICD-10-CM | POA: Diagnosis present

## 2023-04-01 DIAGNOSIS — Z9181 History of falling: Secondary | ICD-10-CM

## 2023-04-01 DIAGNOSIS — K5790 Diverticulosis of intestine, part unspecified, without perforation or abscess without bleeding: Secondary | ICD-10-CM | POA: Diagnosis not present

## 2023-04-01 DIAGNOSIS — Z6841 Body Mass Index (BMI) 40.0 and over, adult: Secondary | ICD-10-CM | POA: Diagnosis not present

## 2023-04-01 DIAGNOSIS — I5032 Chronic diastolic (congestive) heart failure: Secondary | ICD-10-CM | POA: Diagnosis not present

## 2023-04-01 DIAGNOSIS — R410 Disorientation, unspecified: Secondary | ICD-10-CM | POA: Diagnosis not present

## 2023-04-01 DIAGNOSIS — M47812 Spondylosis without myelopathy or radiculopathy, cervical region: Secondary | ICD-10-CM | POA: Diagnosis not present

## 2023-04-01 DIAGNOSIS — S80212A Abrasion, left knee, initial encounter: Secondary | ICD-10-CM | POA: Diagnosis present

## 2023-04-01 DIAGNOSIS — Z888 Allergy status to other drugs, medicaments and biological substances status: Secondary | ICD-10-CM

## 2023-04-01 DIAGNOSIS — D72829 Elevated white blood cell count, unspecified: Secondary | ICD-10-CM | POA: Diagnosis not present

## 2023-04-01 DIAGNOSIS — I5031 Acute diastolic (congestive) heart failure: Secondary | ICD-10-CM | POA: Diagnosis not present

## 2023-04-01 DIAGNOSIS — Z79899 Other long term (current) drug therapy: Secondary | ICD-10-CM

## 2023-04-01 DIAGNOSIS — E8770 Fluid overload, unspecified: Secondary | ICD-10-CM | POA: Diagnosis not present

## 2023-04-01 DIAGNOSIS — Y92019 Unspecified place in single-family (private) house as the place of occurrence of the external cause: Secondary | ICD-10-CM

## 2023-04-01 DIAGNOSIS — D631 Anemia in chronic kidney disease: Secondary | ICD-10-CM | POA: Diagnosis present

## 2023-04-01 DIAGNOSIS — Z8249 Family history of ischemic heart disease and other diseases of the circulatory system: Secondary | ICD-10-CM

## 2023-04-01 DIAGNOSIS — M25511 Pain in right shoulder: Secondary | ICD-10-CM | POA: Diagnosis not present

## 2023-04-01 DIAGNOSIS — R112 Nausea with vomiting, unspecified: Secondary | ICD-10-CM | POA: Diagnosis not present

## 2023-04-01 DIAGNOSIS — I341 Nonrheumatic mitral (valve) prolapse: Secondary | ICD-10-CM | POA: Diagnosis present

## 2023-04-01 DIAGNOSIS — I10 Essential (primary) hypertension: Secondary | ICD-10-CM | POA: Diagnosis not present

## 2023-04-01 DIAGNOSIS — M85862 Other specified disorders of bone density and structure, left lower leg: Secondary | ICD-10-CM | POA: Diagnosis not present

## 2023-04-01 DIAGNOSIS — R6521 Severe sepsis with septic shock: Secondary | ICD-10-CM | POA: Diagnosis not present

## 2023-04-01 DIAGNOSIS — Z515 Encounter for palliative care: Secondary | ICD-10-CM | POA: Diagnosis not present

## 2023-04-01 DIAGNOSIS — G4733 Obstructive sleep apnea (adult) (pediatric): Secondary | ICD-10-CM | POA: Diagnosis not present

## 2023-04-01 DIAGNOSIS — I493 Ventricular premature depolarization: Secondary | ICD-10-CM | POA: Diagnosis not present

## 2023-04-01 DIAGNOSIS — L89153 Pressure ulcer of sacral region, stage 3: Secondary | ICD-10-CM | POA: Diagnosis not present

## 2023-04-01 DIAGNOSIS — N186 End stage renal disease: Secondary | ICD-10-CM | POA: Diagnosis not present

## 2023-04-01 DIAGNOSIS — M25431 Effusion, right wrist: Secondary | ICD-10-CM | POA: Diagnosis not present

## 2023-04-01 DIAGNOSIS — Z87442 Personal history of urinary calculi: Secondary | ICD-10-CM

## 2023-04-01 DIAGNOSIS — M25561 Pain in right knee: Secondary | ICD-10-CM | POA: Diagnosis not present

## 2023-04-01 DIAGNOSIS — S46112A Strain of muscle, fascia and tendon of long head of biceps, left arm, initial encounter: Secondary | ICD-10-CM | POA: Diagnosis not present

## 2023-04-01 DIAGNOSIS — J690 Pneumonitis due to inhalation of food and vomit: Secondary | ICD-10-CM | POA: Diagnosis not present

## 2023-04-01 DIAGNOSIS — Z7901 Long term (current) use of anticoagulants: Secondary | ICD-10-CM

## 2023-04-01 DIAGNOSIS — M0009 Staphylococcal polyarthritis: Secondary | ICD-10-CM | POA: Diagnosis present

## 2023-04-01 DIAGNOSIS — E875 Hyperkalemia: Secondary | ICD-10-CM | POA: Diagnosis present

## 2023-04-01 DIAGNOSIS — E872 Acidosis, unspecified: Secondary | ICD-10-CM | POA: Diagnosis present

## 2023-04-01 DIAGNOSIS — I517 Cardiomegaly: Secondary | ICD-10-CM | POA: Diagnosis not present

## 2023-04-01 DIAGNOSIS — K219 Gastro-esophageal reflux disease without esophagitis: Secondary | ICD-10-CM | POA: Diagnosis present

## 2023-04-01 DIAGNOSIS — S0990XA Unspecified injury of head, initial encounter: Secondary | ICD-10-CM | POA: Diagnosis not present

## 2023-04-01 DIAGNOSIS — I132 Hypertensive heart and chronic kidney disease with heart failure and with stage 5 chronic kidney disease, or end stage renal disease: Secondary | ICD-10-CM | POA: Diagnosis present

## 2023-04-01 DIAGNOSIS — R600 Localized edema: Secondary | ICD-10-CM | POA: Diagnosis not present

## 2023-04-01 DIAGNOSIS — Z7189 Other specified counseling: Secondary | ICD-10-CM | POA: Diagnosis not present

## 2023-04-01 DIAGNOSIS — J189 Pneumonia, unspecified organism: Secondary | ICD-10-CM | POA: Diagnosis present

## 2023-04-01 DIAGNOSIS — R918 Other nonspecific abnormal finding of lung field: Secondary | ICD-10-CM | POA: Diagnosis not present

## 2023-04-01 DIAGNOSIS — S46212A Strain of muscle, fascia and tendon of other parts of biceps, left arm, initial encounter: Secondary | ICD-10-CM | POA: Diagnosis not present

## 2023-04-01 DIAGNOSIS — M25462 Effusion, left knee: Secondary | ICD-10-CM | POA: Diagnosis not present

## 2023-04-01 DIAGNOSIS — M4802 Spinal stenosis, cervical region: Secondary | ICD-10-CM | POA: Diagnosis present

## 2023-04-01 DIAGNOSIS — M50222 Other cervical disc displacement at C5-C6 level: Secondary | ICD-10-CM | POA: Diagnosis not present

## 2023-04-01 DIAGNOSIS — R0989 Other specified symptoms and signs involving the circulatory and respiratory systems: Secondary | ICD-10-CM | POA: Diagnosis not present

## 2023-04-01 DIAGNOSIS — D65 Disseminated intravascular coagulation [defibrination syndrome]: Secondary | ICD-10-CM | POA: Diagnosis not present

## 2023-04-01 DIAGNOSIS — G9341 Metabolic encephalopathy: Secondary | ICD-10-CM | POA: Diagnosis not present

## 2023-04-01 DIAGNOSIS — Z1152 Encounter for screening for COVID-19: Secondary | ICD-10-CM

## 2023-04-01 DIAGNOSIS — M50322 Other cervical disc degeneration at C5-C6 level: Secondary | ICD-10-CM | POA: Diagnosis not present

## 2023-04-01 DIAGNOSIS — D62 Acute posthemorrhagic anemia: Secondary | ICD-10-CM | POA: Diagnosis not present

## 2023-04-01 DIAGNOSIS — M00019 Staphylococcal arthritis, unspecified shoulder: Secondary | ICD-10-CM | POA: Insufficient documentation

## 2023-04-01 DIAGNOSIS — M5021 Other cervical disc displacement,  high cervical region: Secondary | ICD-10-CM | POA: Diagnosis not present

## 2023-04-01 DIAGNOSIS — E1122 Type 2 diabetes mellitus with diabetic chronic kidney disease: Secondary | ICD-10-CM | POA: Diagnosis not present

## 2023-04-01 DIAGNOSIS — R0602 Shortness of breath: Secondary | ICD-10-CM | POA: Diagnosis not present

## 2023-04-01 DIAGNOSIS — R7401 Elevation of levels of liver transaminase levels: Secondary | ICD-10-CM | POA: Diagnosis not present

## 2023-04-01 DIAGNOSIS — M25562 Pain in left knee: Secondary | ICD-10-CM | POA: Diagnosis not present

## 2023-04-01 DIAGNOSIS — S0083XA Contusion of other part of head, initial encounter: Secondary | ICD-10-CM | POA: Diagnosis present

## 2023-04-01 DIAGNOSIS — D61818 Other pancytopenia: Secondary | ICD-10-CM | POA: Diagnosis not present

## 2023-04-01 DIAGNOSIS — L089 Local infection of the skin and subcutaneous tissue, unspecified: Secondary | ICD-10-CM | POA: Diagnosis not present

## 2023-04-01 DIAGNOSIS — R55 Syncope and collapse: Secondary | ICD-10-CM | POA: Diagnosis present

## 2023-04-01 DIAGNOSIS — M00031 Staphylococcal arthritis, right wrist: Secondary | ICD-10-CM | POA: Diagnosis not present

## 2023-04-01 DIAGNOSIS — M19012 Primary osteoarthritis, left shoulder: Secondary | ICD-10-CM | POA: Diagnosis present

## 2023-04-01 DIAGNOSIS — R7881 Bacteremia: Secondary | ICD-10-CM | POA: Diagnosis present

## 2023-04-01 DIAGNOSIS — J90 Pleural effusion, not elsewhere classified: Secondary | ICD-10-CM | POA: Diagnosis not present

## 2023-04-01 DIAGNOSIS — M19011 Primary osteoarthritis, right shoulder: Secondary | ICD-10-CM | POA: Diagnosis not present

## 2023-04-01 DIAGNOSIS — R7989 Other specified abnormal findings of blood chemistry: Secondary | ICD-10-CM | POA: Diagnosis not present

## 2023-04-01 DIAGNOSIS — Z452 Encounter for adjustment and management of vascular access device: Secondary | ICD-10-CM | POA: Diagnosis not present

## 2023-04-01 DIAGNOSIS — I4819 Other persistent atrial fibrillation: Secondary | ICD-10-CM | POA: Diagnosis not present

## 2023-04-01 DIAGNOSIS — M25552 Pain in left hip: Secondary | ICD-10-CM | POA: Diagnosis not present

## 2023-04-01 DIAGNOSIS — I340 Nonrheumatic mitral (valve) insufficiency: Secondary | ICD-10-CM | POA: Diagnosis present

## 2023-04-01 DIAGNOSIS — J9811 Atelectasis: Secondary | ICD-10-CM | POA: Diagnosis not present

## 2023-04-01 DIAGNOSIS — I480 Paroxysmal atrial fibrillation: Secondary | ICD-10-CM | POA: Diagnosis present

## 2023-04-01 DIAGNOSIS — K828 Other specified diseases of gallbladder: Secondary | ICD-10-CM | POA: Diagnosis not present

## 2023-04-01 DIAGNOSIS — S80211A Abrasion, right knee, initial encounter: Secondary | ICD-10-CM | POA: Diagnosis present

## 2023-04-01 DIAGNOSIS — M00061 Staphylococcal arthritis, right knee: Secondary | ICD-10-CM | POA: Diagnosis not present

## 2023-04-01 DIAGNOSIS — G4710 Hypersomnia, unspecified: Secondary | ICD-10-CM | POA: Diagnosis not present

## 2023-04-01 DIAGNOSIS — M7989 Other specified soft tissue disorders: Secondary | ICD-10-CM | POA: Diagnosis not present

## 2023-04-01 DIAGNOSIS — N2581 Secondary hyperparathyroidism of renal origin: Secondary | ICD-10-CM | POA: Diagnosis present

## 2023-04-01 DIAGNOSIS — M009 Pyogenic arthritis, unspecified: Secondary | ICD-10-CM | POA: Diagnosis not present

## 2023-04-01 DIAGNOSIS — M25531 Pain in right wrist: Secondary | ICD-10-CM | POA: Diagnosis not present

## 2023-04-01 DIAGNOSIS — A419 Sepsis, unspecified organism: Principal | ICD-10-CM

## 2023-04-01 DIAGNOSIS — W19XXXA Unspecified fall, initial encounter: Secondary | ICD-10-CM | POA: Diagnosis present

## 2023-04-01 DIAGNOSIS — I5030 Unspecified diastolic (congestive) heart failure: Secondary | ICD-10-CM | POA: Diagnosis not present

## 2023-04-01 DIAGNOSIS — E871 Hypo-osmolality and hyponatremia: Secondary | ICD-10-CM | POA: Diagnosis present

## 2023-04-01 DIAGNOSIS — M25461 Effusion, right knee: Secondary | ICD-10-CM | POA: Diagnosis not present

## 2023-04-01 DIAGNOSIS — M00012 Staphylococcal arthritis, left shoulder: Secondary | ICD-10-CM | POA: Diagnosis not present

## 2023-04-01 DIAGNOSIS — R140 Abdominal distension (gaseous): Secondary | ICD-10-CM | POA: Diagnosis not present

## 2023-04-01 DIAGNOSIS — F05 Delirium due to known physiological condition: Secondary | ICD-10-CM | POA: Diagnosis not present

## 2023-04-01 DIAGNOSIS — N250 Renal osteodystrophy: Secondary | ICD-10-CM | POA: Diagnosis not present

## 2023-04-01 DIAGNOSIS — M1711 Unilateral primary osteoarthritis, right knee: Secondary | ICD-10-CM | POA: Diagnosis present

## 2023-04-01 DIAGNOSIS — M1712 Unilateral primary osteoarthritis, left knee: Secondary | ICD-10-CM | POA: Diagnosis not present

## 2023-04-01 DIAGNOSIS — M85812 Other specified disorders of bone density and structure, left shoulder: Secondary | ICD-10-CM | POA: Diagnosis not present

## 2023-04-01 DIAGNOSIS — I499 Cardiac arrhythmia, unspecified: Secondary | ICD-10-CM | POA: Diagnosis not present

## 2023-04-01 DIAGNOSIS — E876 Hypokalemia: Secondary | ICD-10-CM | POA: Diagnosis present

## 2023-04-01 DIAGNOSIS — M25412 Effusion, left shoulder: Secondary | ICD-10-CM | POA: Diagnosis not present

## 2023-04-01 DIAGNOSIS — I120 Hypertensive chronic kidney disease with stage 5 chronic kidney disease or end stage renal disease: Secondary | ICD-10-CM | POA: Diagnosis not present

## 2023-04-01 DIAGNOSIS — E7800 Pure hypercholesterolemia, unspecified: Secondary | ICD-10-CM | POA: Diagnosis present

## 2023-04-01 DIAGNOSIS — E440 Moderate protein-calorie malnutrition: Secondary | ICD-10-CM | POA: Diagnosis not present

## 2023-04-01 DIAGNOSIS — Z96642 Presence of left artificial hip joint: Secondary | ICD-10-CM | POA: Diagnosis present

## 2023-04-01 DIAGNOSIS — Z743 Need for continuous supervision: Secondary | ICD-10-CM | POA: Diagnosis not present

## 2023-04-01 DIAGNOSIS — M75121 Complete rotator cuff tear or rupture of right shoulder, not specified as traumatic: Secondary | ICD-10-CM | POA: Diagnosis not present

## 2023-04-01 DIAGNOSIS — R6889 Other general symptoms and signs: Secondary | ICD-10-CM | POA: Diagnosis not present

## 2023-04-01 DIAGNOSIS — M50221 Other cervical disc displacement at C4-C5 level: Secondary | ICD-10-CM | POA: Diagnosis not present

## 2023-04-01 DIAGNOSIS — Z818 Family history of other mental and behavioral disorders: Secondary | ICD-10-CM

## 2023-04-01 DIAGNOSIS — Z905 Acquired absence of kidney: Secondary | ICD-10-CM

## 2023-04-01 DIAGNOSIS — R0902 Hypoxemia: Secondary | ICD-10-CM | POA: Diagnosis not present

## 2023-04-01 DIAGNOSIS — B9561 Methicillin susceptible Staphylococcus aureus infection as the cause of diseases classified elsewhere: Secondary | ICD-10-CM | POA: Diagnosis not present

## 2023-04-01 DIAGNOSIS — M85861 Other specified disorders of bone density and structure, right lower leg: Secondary | ICD-10-CM | POA: Diagnosis not present

## 2023-04-01 DIAGNOSIS — S46812A Strain of other muscles, fascia and tendons at shoulder and upper arm level, left arm, initial encounter: Secondary | ICD-10-CM | POA: Diagnosis not present

## 2023-04-01 DIAGNOSIS — N2889 Other specified disorders of kidney and ureter: Secondary | ICD-10-CM | POA: Diagnosis not present

## 2023-04-01 DIAGNOSIS — Z91158 Patient's noncompliance with renal dialysis for other reason: Secondary | ICD-10-CM

## 2023-04-01 DIAGNOSIS — L89156 Pressure-induced deep tissue damage of sacral region: Secondary | ICD-10-CM | POA: Diagnosis present

## 2023-04-01 DIAGNOSIS — N261 Atrophy of kidney (terminal): Secondary | ICD-10-CM | POA: Diagnosis not present

## 2023-04-01 DIAGNOSIS — E8809 Other disorders of plasma-protein metabolism, not elsewhere classified: Secondary | ICD-10-CM | POA: Diagnosis not present

## 2023-04-01 DIAGNOSIS — L988 Other specified disorders of the skin and subcutaneous tissue: Secondary | ICD-10-CM | POA: Diagnosis not present

## 2023-04-01 DIAGNOSIS — R531 Weakness: Secondary | ICD-10-CM | POA: Diagnosis not present

## 2023-04-01 DIAGNOSIS — Z87891 Personal history of nicotine dependence: Secondary | ICD-10-CM

## 2023-04-01 DIAGNOSIS — R54 Age-related physical debility: Secondary | ICD-10-CM | POA: Diagnosis present

## 2023-04-01 DIAGNOSIS — I472 Ventricular tachycardia, unspecified: Secondary | ICD-10-CM | POA: Diagnosis not present

## 2023-04-01 DIAGNOSIS — I4891 Unspecified atrial fibrillation: Secondary | ICD-10-CM | POA: Diagnosis not present

## 2023-04-01 DIAGNOSIS — M25411 Effusion, right shoulder: Secondary | ICD-10-CM | POA: Diagnosis not present

## 2023-04-01 LAB — COMPREHENSIVE METABOLIC PANEL
ALT: 56 U/L — ABNORMAL HIGH (ref 0–44)
ALT: 65 U/L — ABNORMAL HIGH (ref 0–44)
AST: 147 U/L — ABNORMAL HIGH (ref 15–41)
AST: 172 U/L — ABNORMAL HIGH (ref 15–41)
Albumin: 2.8 g/dL — ABNORMAL LOW (ref 3.5–5.0)
Albumin: 2.5 g/dL — ABNORMAL LOW (ref 3.5–5.0)
Alkaline Phosphatase: 47 U/L (ref 38–126)
Alkaline Phosphatase: 39 U/L (ref 38–126)
Anion gap: 20 — ABNORMAL HIGH (ref 5–15)
Anion gap: 20 — ABNORMAL HIGH (ref 5–15)
BUN: 102 mg/dL — ABNORMAL HIGH (ref 8–23)
BUN: 104 mg/dL — ABNORMAL HIGH (ref 8–23)
CO2: 18 mmol/L — ABNORMAL LOW (ref 22–32)
CO2: 14 mmol/L — ABNORMAL LOW (ref 22–32)
Calcium: 9.8 mg/dL (ref 8.9–10.3)
Calcium: 9.1 mg/dL (ref 8.9–10.3)
Chloride: 91 mmol/L — ABNORMAL LOW (ref 98–111)
Chloride: 93 mmol/L — ABNORMAL LOW (ref 98–111)
Creatinine, Ser: 13.88 mg/dL — ABNORMAL HIGH (ref 0.61–1.24)
Creatinine, Ser: 13.58 mg/dL — ABNORMAL HIGH (ref 0.61–1.24)
GFR, Estimated: 3 mL/min — ABNORMAL LOW (ref >60)
GFR, Estimated: 3 mL/min — ABNORMAL LOW (ref >60)
Glucose, Bld: 136 mg/dL — ABNORMAL HIGH (ref 70–99)
Glucose, Bld: 125 mg/dL — ABNORMAL HIGH (ref 70–99)
Potassium: 5.3 mmol/L — ABNORMAL HIGH (ref 3.5–5.1)
Potassium: 5.6 mmol/L — ABNORMAL HIGH (ref 3.5–5.1)
Sodium: 129 mmol/L — ABNORMAL LOW (ref 135–145)
Sodium: 127 mmol/L — ABNORMAL LOW (ref 135–145)
Total Bilirubin: 1.1 mg/dL (ref 0.3–1.2)
Total Bilirubin: 1 mg/dL (ref 0.3–1.2)
Total Protein: 7.4 g/dL (ref 6.5–8.1)
Total Protein: 6.6 g/dL (ref 6.5–8.1)

## 2023-04-01 LAB — BLOOD CULTURE ID PANEL (REFLEXED) - BCID2

## 2023-04-01 LAB — CBC WITH DIFFERENTIAL/PLATELET
Abs Immature Granulocytes: 0.21 10*3/uL — ABNORMAL HIGH (ref 0.00–0.07)
Basophils Absolute: 0 10*3/uL (ref 0.0–0.1)
Basophils Relative: 0 %
Eosinophils Absolute: 0 10*3/uL (ref 0.0–0.5)
Eosinophils Relative: 0 %
HCT: 40.6 % (ref 39.0–52.0)
Hemoglobin: 13.8 g/dL (ref 13.0–17.0)
Immature Granulocytes: 2 %
Lymphocytes Relative: 4 %
Lymphs Abs: 0.6 10*3/uL — ABNORMAL LOW (ref 0.7–4.0)
MCH: 33.6 pg (ref 26.0–34.0)
MCHC: 34 g/dL (ref 30.0–36.0)
MCV: 98.8 fL (ref 80.0–100.0)
Monocytes Absolute: 0.9 10*3/uL (ref 0.1–1.0)
Monocytes Relative: 6 %
Neutro Abs: 12.1 10*3/uL — ABNORMAL HIGH (ref 1.7–7.7)
Neutrophils Relative %: 88 %
Platelets: 85 10*3/uL — ABNORMAL LOW (ref 150–400)
RBC: 4.11 MIL/uL — ABNORMAL LOW (ref 4.22–5.81)
RDW: 14.2 % (ref 11.5–15.5)
WBC: 13.8 10*3/uL — ABNORMAL HIGH (ref 4.0–10.5)
nRBC: 0 % (ref 0.0–0.2)

## 2023-04-01 LAB — RESP PANEL BY RT-PCR (RSV, FLU A&B, COVID)  RVPGX2
Influenza A by PCR: NEGATIVE
Influenza B by PCR: NEGATIVE
Resp Syncytial Virus by PCR: NEGATIVE
SARS Coronavirus 2 by RT PCR: NEGATIVE

## 2023-04-01 LAB — CULTURE, BLOOD (ROUTINE X 2)

## 2023-04-01 LAB — PROTIME-INR
INR: 1.6 — ABNORMAL HIGH (ref 0.8–1.2)
Prothrombin Time: 19 seconds — ABNORMAL HIGH (ref 11.4–15.2)

## 2023-04-01 LAB — HEPATITIS B SURFACE ANTIGEN: Hepatitis B Surface Ag: NONREACTIVE

## 2023-04-01 LAB — APTT: aPTT: 33 seconds (ref 24–36)

## 2023-04-01 LAB — LACTIC ACID, PLASMA
Lactic Acid, Venous: 4.6 mmol/L (ref 0.5–1.9)
Lactic Acid, Venous: 4 mmol/L (ref 0.5–1.9)

## 2023-04-01 MED ORDER — ACETAMINOPHEN 325 MG PO TABS
650.0000 mg | ORAL_TABLET | Freq: Four times a day (QID) | ORAL | Status: DC | PRN
  Administered 2023-04-02 – 2023-04-18 (×10): 650 mg via ORAL
  Filled 2023-04-01 (×10): qty 2

## 2023-04-01 MED ORDER — PANTOPRAZOLE SODIUM 40 MG PO TBEC
40.0000 mg | DELAYED_RELEASE_TABLET | Freq: Every day | ORAL | Status: DC
  Filled 2023-04-01: qty 1

## 2023-04-01 MED ORDER — LIDOCAINE-PRILOCAINE 2.5-2.5 % EX CREA
1.0000 | TOPICAL_CREAM | CUTANEOUS | Status: DC | PRN
  Filled 2023-04-01: qty 5

## 2023-04-01 MED ORDER — ACETAMINOPHEN 325 MG PO TABS
ORAL_TABLET | ORAL | Status: AC
  Administered 2023-04-01: 650 mg via ORAL
  Filled 2023-04-01: qty 2

## 2023-04-01 MED ORDER — SODIUM ZIRCONIUM CYCLOSILICATE 10 G PO PACK
10.0000 g | PACK | Freq: Once | ORAL | Status: AC
  Administered 2023-04-02: 10 g via ORAL
  Filled 2023-04-01 (×2): qty 1

## 2023-04-01 MED ORDER — CHLORHEXIDINE GLUCONATE CLOTH 2 % EX PADS
6.0000 | MEDICATED_PAD | Freq: Every day | CUTANEOUS | Status: DC
  Administered 2023-04-02 – 2023-04-05 (×4): 6 via TOPICAL

## 2023-04-01 MED ORDER — FERRIC CITRATE 1 GM 210 MG(FE) PO TABS
630.0000 mg | ORAL_TABLET | Freq: Three times a day (TID) | ORAL | Status: DC
  Administered 2023-04-02 – 2023-04-17 (×35): 630 mg via ORAL
  Filled 2023-04-01 (×46): qty 3

## 2023-04-01 MED ORDER — PROSOURCE PLUS PO LIQD
30.0000 mL | Freq: Two times a day (BID) | ORAL | Status: DC
  Administered 2023-04-02 – 2023-04-11 (×10): 30 mL via ORAL
  Filled 2023-04-01 (×12): qty 30

## 2023-04-01 MED ORDER — CINACALCET HCL 30 MG PO TABS
180.0000 mg | ORAL_TABLET | Freq: Every day | ORAL | Status: DC
  Administered 2023-04-02 – 2023-04-08 (×5): 180 mg via ORAL
  Filled 2023-04-01 (×7): qty 6

## 2023-04-01 MED ORDER — LIDOCAINE HCL (PF) 1 % IJ SOLN
5.0000 mL | INTRAMUSCULAR | Status: DC | PRN
  Filled 2023-04-01: qty 5

## 2023-04-01 MED ORDER — SODIUM CHLORIDE 0.9 % IV SOLN
2.0000 g | INTRAVENOUS | Status: DC
  Administered 2023-04-01: 2 g via INTRAVENOUS
  Filled 2023-04-01: qty 20

## 2023-04-01 MED ORDER — HEPARIN SODIUM (PORCINE) 1000 UNIT/ML IJ SOLN
INTRAMUSCULAR | Status: AC
  Administered 2023-04-01: 6000 [IU] via INTRAVENOUS_CENTRAL
  Filled 2023-04-01: qty 6

## 2023-04-01 MED ORDER — SODIUM CHLORIDE 0.9 % IV SOLN
500.0000 mg | INTRAVENOUS | Status: DC
  Administered 2023-04-01: 500 mg via INTRAVENOUS
  Filled 2023-04-01: qty 5

## 2023-04-01 MED ORDER — ACETAMINOPHEN 500 MG PO TABS
1000.0000 mg | ORAL_TABLET | Freq: Once | ORAL | Status: AC
  Administered 2023-04-01: 1000 mg via ORAL
  Filled 2023-04-01: qty 2

## 2023-04-01 MED ORDER — SODIUM CHLORIDE 0.9 % IV BOLUS
1000.0000 mL | Freq: Once | INTRAVENOUS | Status: AC
  Administered 2023-04-01: 1000 mL via INTRAVENOUS

## 2023-04-01 MED ORDER — CEFAZOLIN SODIUM-DEXTROSE 1-4 GM/50ML-% IV SOLN
1.0000 g | INTRAVENOUS | Status: DC
  Administered 2023-04-01: 1 g via INTRAVENOUS
  Filled 2023-04-01: qty 50

## 2023-04-01 MED ORDER — ATORVASTATIN CALCIUM 10 MG PO TABS
10.0000 mg | ORAL_TABLET | Freq: Every day | ORAL | Status: DC
  Administered 2023-04-01 – 2023-04-17 (×17): 10 mg via ORAL
  Filled 2023-04-01 (×17): qty 1

## 2023-04-01 MED ORDER — HEPARIN SODIUM (PORCINE) 1000 UNIT/ML DIALYSIS
6000.0000 [IU] | Freq: Once | INTRAMUSCULAR | Status: AC
  Filled 2023-04-01: qty 6

## 2023-04-01 MED ORDER — PENTAFLUOROPROP-TETRAFLUOROETH EX AERO: 1.0000 | INHALATION_SPRAY | CUTANEOUS | Status: DC | PRN

## 2023-04-01 MED ORDER — APIXABAN 5 MG PO TABS
5.0000 mg | ORAL_TABLET | Freq: Two times a day (BID) | ORAL | Status: DC
  Administered 2023-04-01 – 2023-04-02 (×2): 5 mg via ORAL
  Filled 2023-04-01 (×2): qty 1

## 2023-04-01 MED ORDER — ONDANSETRON HCL 4 MG/2ML IJ SOLN
4.0000 mg | Freq: Once | INTRAMUSCULAR | Status: AC
  Administered 2023-04-01: 4 mg via INTRAVENOUS
  Filled 2023-04-01: qty 2

## 2023-04-01 NOTE — ED Provider Notes (Addendum)
Peter [name]   CSN: [id number] Arrival date & time: 04/01/23  4098     History  Chief Complaint  Patient presents with   Fever   Emesis    Peter [name] is a 72 y.o. male.  Patient arrives with weakness for 2 days, fever for 2 days with nausea and vomiting.  Sunday he vomited several times but has not vomited since.  Seems like he had a fall at sometime on Sunday.  He is on Eliquis for A-fib.  Has a history of chronic kidney disease on dialysis.  Missed dialysis yesterday.  Family member took care of him yesterday given Tylenol did a home COVID test that was negative.  He denies any abdominal pain or cough or sputum production.  He is having some pain to his knees from his fall.  He lives by himself.  Mobility issues now more chronic.  He denies any diarrhea.  He was able to eat yesterday without any major issues.  Continues to feel really weak and rundown per family.  The history is provided by the patient and a caregiver.       Home Medications Prior to Admission medications   Medication Sig Start Date End Date Taking? Authorizing Provider  apixaban (ELIQUIS) 5 MG TABS tablet Take 1 tablet (5 mg total) by mouth 2 (two) times daily. 02/26/23   Rollene Rotunda, MD  apixaban (ELIQUIS) 5 MG TABS tablet Take 1 tablet (5 mg total) by mouth 2 (two) times daily. 02/26/23   Rollene Rotunda, MD  atorvastatin (LIPITOR) 10 MG tablet Take 10 mg by mouth at bedtime.    [provider]  AURYXIA 1 GM 210 MG(Fe) tablet Take 420-630 mg by mouth See admin instructions. Take 630mg  by mouth three times daily with meals and take 420mg  by mouth with snacks.    [provider]  cinacalcet (SENSIPAR) 90 MG tablet Take 180 mg by mouth daily.    [provider]  traMADol (ULTRAM) 50 MG tablet Take 1 tablet (50 mg total) by mouth every 6 (six) hours as needed. Patient not taking: Reported on 03/20/2023 01/16/23 01/16/24   Loel Dubonnet P, PA-C      Allergies    Penicillins, Doxazosin mesylate, and Triamterene-hctz    Review of Systems   Review of Systems  Physical Exam Updated Vital Signs BP 116/82   Pulse (!) 40   Temp 97.9 F (36.6 C) (Oral)   Resp (!) 41   Ht 5\' 6"  (1.676 m)   Wt 99.3 kg   SpO2 97%   BMI 35.35 kg/m  Physical Exam Vitals and nursing [name] reviewed.  Constitutional:      General: He is not in acute distress.    Appearance: He is well-developed. He is ill-appearing.  HENT:     Head:     Comments: Bruising around the left forehead/eye    Nose: Nose normal.     Mouth/Throat:     Mouth: Mucous membranes are moist.  Eyes:     Extraocular Movements: Extraocular movements intact.     Conjunctiva/sclera: Conjunctivae normal.     Pupils: Pupils are equal, round, and reactive to light.  Cardiovascular:     Rate and Rhythm: Normal rate and regular rhythm.     Heart sounds: No murmur heard. Pulmonary:     Effort: Pulmonary effort is normal. No respiratory distress.     Breath sounds: Normal breath sounds.  Abdominal:  Palpations: Abdomen is soft.     Tenderness: There is no abdominal tenderness. There is no guarding.  Musculoskeletal:        General: Tenderness present. No swelling.     Cervical back: Neck supple.     Comments: Tenderness to both knees  Skin:    General: Skin is warm and dry.     Capillary Refill: Capillary refill takes less than 2 seconds.     Comments: Has had a little bit of redness to his perirectal area but nothing that looks like a major cellulitis or purulence or ulceration, he is got abrasions to both knees  Neurological:     General: No focal deficit present.     Mental Status: He is alert.     Cranial Nerves: No cranial nerve deficit.     Sensory: No sensory deficit.     Motor: No weakness.  Psychiatric:        Mood and Affect: Mood normal.     ED Results / Procedures / Treatments   Labs (all labs ordered are listed, but only  abnormal results are displayed) Labs Reviewed  LACTIC ACID, PLASMA - Abnormal; Notable for the following components:      Result Value   Lactic Acid, Venous 4.6 (*)    All other components within normal limits  COMPREHENSIVE METABOLIC PANEL - Abnormal; Notable for the following components:   Sodium 129 (*)    Potassium 5.3 (*)    Chloride 91 (*)    CO2 18 (*)    Glucose, Bld 136 (*)    BUN 102 (*)    Creatinine, Ser 13.88 (*)    Albumin 2.8 (*)    AST 147 (*)    ALT 56 (*)    GFR, Estimated 3 (*)    Anion gap 20 (*)    All other components within normal limits  CBC WITH DIFFERENTIAL/PLATELET - Abnormal; Notable for the following components:   WBC 13.8 (*)    RBC 4.11 (*)    Platelets 85 (*)    Neutro Abs 12.1 (*)    Lymphs Abs 0.6 (*)    Abs Immature Granulocytes 0.21 (*)    All other components within normal limits  PROTIME-INR - Abnormal; Notable for the following components:   Prothrombin Time 19.0 (*)    INR 1.6 (*)    All other components within normal limits  RESP PANEL BY RT-PCR (RSV, FLU A&B, COVID)  [id number]  CULTURE, BLOOD (ROUTINE X 2)  CULTURE, BLOOD (ROUTINE X 2)  APTT  LACTIC ACID, PLASMA  URINALYSIS, W/ REFLEX TO CULTURE (INFECTION SUSPECTED)    EKG EKG Interpretation Date/Time:  Tuesday April 01 2023 08:17:54 EDT Ventricular Rate:  113 PR Interval:    QRS Duration:  88 QT Interval:  315 QTC Calculation: 405 R Axis:   70  Text Interpretation: Atrial fibrillation Paired ventricular premature complexes Borderline T abnormalities, inferior leads Confirmed by Virgina Norfolk [phone]) on 04/01/2023 8:24:09 AM  Radiology CT Head Wo Contrast  Result Date: 04/01/2023 CLINICAL DATA:  Provided history: Polytrauma, blunt. EXAM: CT HEAD WITHOUT CONTRAST CT CERVICAL SPINE WITHOUT CONTRAST TECHNIQUE: Multidetector CT imaging of the head and cervical spine was performed following the standard protocol without intravenous contrast. Multiplanar CT image reconstructions of  the cervical spine were also generated. RADIATION DOSE REDUCTION: This exam was performed according to the departmental dose-optimization program which includes automated exposure control, adjustment of the mA and/or kV according to patient size and/or use of  iterative reconstruction technique. COMPARISON:  None. FINDINGS: CT HEAD FINDINGS Brain: Mild generalized parenchymal atrophy. There is no acute intracranial hemorrhage. No demarcated cortical infarct. No extra-axial fluid collection. No evidence of an intracranial mass. No midline shift. Vascular: No hyperdense vessel.  Atherosclerotic calcifications. Skull: No calvarial fracture or aggressive osseous lesion. Sinuses/Orbits: No mass or acute finding within the imaged orbits. Mild-to-moderate mucosal thickening, and small volume frothy secretions, within the right maxillary sinus. Small mucous retention cyst within the left maxillary sinus. CT CERVICAL SPINE FINDINGS Alignment: No significant spondylolisthesis. Skull base and vertebrae: The basion-dental and atlanto-dental intervals are maintained.No evidence of acute fracture to the cervical spine. Soft tissues and spinal canal: No prevertebral fluid or swelling. No visible canal hematoma. Disc levels: Cervical spondylosis with multilevel disc space narrowing, disc bulges/central disc protrusions, endplate spurring, uncovertebral hypertrophy and facet arthrosis. Disc space narrowing is greatest at C5-C6 (moderate/advanced), C7-T1 (moderate/advanced), T1-T2 (advanced) and T2-T3 (advanced). 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 Most notably at C2-C3 and C3-C4, central disc protrusions contribute to at least moderate spinal canal stenosis. Multilevel bony neural foraminal narrowing. Upper chest: No consolidation within the imaged lung apices. No visible pneumothorax. IMPRESSION: CT head: 1.  No evidence of an acute intracranial abnormality. 2. Mild  generalized parenchymal atrophy. 3. Paranasal sinus disease as described. CT cervical spine: 1. No evidence of an acute cervical spine fracture. 2. Cervical spondylosis as outlined. Notably at C2-C3 and C3-C4, central disc protrusions contribute to at least moderate spinal canal stenosis. Multilevel bony neural from narrowing. Electronically Signed   By: Jackey Loge D.O.   On: 04/01/2023 10:32   CT Cervical Spine Wo Contrast  Result Date: 04/01/2023 CLINICAL DATA:  Provided history: Polytrauma, blunt. EXAM: CT HEAD WITHOUT CONTRAST CT CERVICAL SPINE WITHOUT CONTRAST TECHNIQUE: Multidetector CT imaging of the head and cervical spine was performed following the standard protocol without intravenous contrast. Multiplanar CT image reconstructions of the cervical spine were also generated. RADIATION DOSE REDUCTION: This exam was performed according to the departmental dose-optimization program which includes automated exposure control, adjustment of the mA and/or kV according to patient size and/or use of iterative reconstruction technique. COMPARISON:  None. FINDINGS: CT HEAD FINDINGS Brain: Mild generalized parenchymal atrophy. There is no acute intracranial hemorrhage. No demarcated cortical infarct. No extra-axial fluid collection. No evidence of an intracranial mass. No midline shift. Vascular: No hyperdense vessel.  Atherosclerotic calcifications. Skull: No calvarial fracture or aggressive osseous lesion. Sinuses/Orbits: No mass or acute finding within the imaged orbits. Mild-to-moderate mucosal thickening, and small volume frothy secretions, within the right maxillary sinus. Small mucous retention cyst within the left maxillary sinus. CT CERVICAL SPINE FINDINGS Alignment: No significant spondylolisthesis. Skull base and vertebrae: The basion-dental and atlanto-dental intervals are maintained.No evidence of acute fracture to the cervical spine. Soft tissues and spinal canal: No prevertebral fluid or swelling.  No visible canal hematoma. Disc levels: Cervical spondylosis with multilevel disc space narrowing, disc bulges/central disc protrusions, endplate spurring, uncovertebral hypertrophy and facet arthrosis. Disc space narrowing is greatest at C5-C6 (moderate/advanced), C7-T1 (moderate/advanced), T1-T2 (advanced) and T2-T3 (advanced). 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 Most notably at C2-C3 and C3-C4, central disc protrusions contribute to at least moderate spinal canal stenosis. Multilevel bony neural foraminal narrowing. Upper chest: No consolidation within the imaged lung apices. No visible pneumothorax. IMPRESSION: CT head: 1.  No evidence of an acute intracranial abnormality. 2. Mild generalized parenchymal atrophy. 3.  Paranasal sinus disease as described. CT cervical spine: 1. No evidence of an acute cervical spine fracture. 2. Cervical spondylosis as outlined. Notably at C2-C3 and C3-C4, central disc protrusions contribute to at least moderate spinal canal stenosis. Multilevel bony neural from narrowing. Electronically Signed   By: Jackey Loge D.O.   On: 04/01/2023 10:32   DG Chest Portable 1 View  Result Date: 04/01/2023 CLINICAL DATA:  Shortness of breath EXAM: PORTABLE CHEST 1 VIEW COMPARISON:  Chest radiograph dated 06/09/2018 FINDINGS: The medial apices are obscured due to overlying mandible. Low lung volumes with bronchovascular crowding. Medial right lower lung patchy opacities. No pleural effusion or pneumothorax. Similar enlarged cardiomediastinal silhouette. No acute osseous abnormality. IMPRESSION: 1. Medial right lower lung patchy opacities, which may represent atelectasis, aspiration, or pneumonia. 2. Similar cardiomegaly. Electronically Signed   By: Agustin Cree M.D.   On: 04/01/2023 08:50    Procedures .Critical Care  Performed by: Virgina Norfolk, DO Authorized by: Virgina Norfolk, DO   Critical care provider statement:     Critical care time (minutes):  35   Critical care was necessary to treat or prevent imminent or life-threatening deterioration of the following conditions:  Sepsis   Critical care was time spent personally by me on the following activities:  Blood draw for specimens, development of treatment plan with patient or surrogate, discussions with primary provider, evaluation of patient's response to treatment, examination of patient, obtaining history from patient or surrogate, ordering and performing treatments and interventions, ordering and review of laboratory studies, ordering and review of radiographic studies, pulse oximetry, re-evaluation of patient's condition and review of old charts   I assumed direction of critical care for this patient from another provider in my specialty: no     Care discussed with: admitting provider       Medications Ordered in ED Medications  cefTRIAXone (ROCEPHIN) 2 g in sodium chloride 0.9 % 100 mL IVPB (0 g Intravenous Stopped 04/01/23 1007)  azithromycin (ZITHROMAX) 500 mg in sodium chloride 0.9 % 250 mL IVPB (0 mg Intravenous Stopped 04/01/23 1037)  acetaminophen (TYLENOL) tablet 1,000 mg (1,000 mg Oral Given 04/01/23 0903)  ondansetron (ZOFRAN) injection 4 mg (4 mg Intravenous Given 04/01/23 0905)  sodium chloride 0.9 % bolus 1,000 mL (1,000 mLs Intravenous New Bag/Given 04/01/23 1054)    ED Course/ Medical Decision Making/ A&P                             Medical Decision Making Amount and/or Complexity of Data Reviewed Labs: ordered. Radiology: ordered.  Risk OTC drugs. Prescription drug management. Decision regarding hospitalization.   Peter [name] is here with fever and weakness.  Patient febrile upon arrival but otherwise unremarkable vitals.  He is tachypneic.  Missed dialysis yesterday.  History of atrial fibrillation on Eliquis.  History of high cholesterol.  Patient with history of CKD.  He arrives febrile.  He had nausea and vomiting 2 days ago.   Fever started then and continued into yesterday.  Had a fall on Sunday as well.  Did not get evaluated.  He has pain to both knees from the fall.  Nausea vomiting has improved.  He has no abdominal pain, no diarrhea.  Differential diagnosis sepsis likely from viral process but could be pneumonia.  Does not really make urine anymore, is not having any abdominal tenderness and seems less likely to be an intra-abdominal process.  He does have a couple of  abrasions and bruising on exam but nothing that looks like a cellulitis.  Ultimately he is in A-fib with a heart rate between 100-120.  With fever.  Will start some antibiotics and sepsis workup with blood cultures, chest x-ray, lactic acid.  Anticipate admission.  Will hold fluids given that he is missed dialysis and his blood pressure is normal.  Per my review and interpretation of labs patient with lactic acid of 4.6.  White count of 13.8.  Pneumonia with what appears to be may be pneumonia.  Will give fluid bolus.  However will not do 30 cc/kg of IV fluids given that his blood pressure is normal.  He is a dialysis patient.  He has missed dialysis as well.  COVID and flu test are negative.  Neck CT is unremarkable per radiology report.  Overall suspect that he has sepsis from respiratory process.  Will admit for further care.  He has no abdominal tenderness.  On repeat exam he still has no tenderness.  I am not convinced that he has a cellulitis.  Dynamically stable.  This chart was dictated using voice recognition software.  Despite best efforts to proofread,  errors can occur which can change the documentation meaning.         Final Clinical Impression(s) / ED Diagnoses Final diagnoses:  Sepsis, due to unspecified organism, unspecified whether acute organ dysfunction present [facility])    Rx / DC Orders ED Discharge Orders     None         Virgina Norfolk, DO 04/01/23 1059    Doyal Saric, DO 04/01/23 1100

## 2023-04-01 NOTE — ED Notes (Signed)
Patient transported to CT 

## 2023-04-01 NOTE — ED Notes (Signed)
Phlebotomy at bedside to obtain 2nd set of cultures ? ?

## 2023-04-01 NOTE — Consult Note (Signed)
[facility] Renal Consultation Note    Indication for Consultation:  Management of ESRD/hemodialysis, anemia, hypertension/volume, and secondary hyperparathyroidism. PCP:  HPI: Minton Ravenscroft is a 72 y.o. male with ESRD, T2DM, HTN, OSA, A-fib (on Eliquis), HL who was admitted with pneumonia.  Presented to ED today with 3 days of feeling weak, tired. Had several episodes of vomiting on Sunday - none since. No CP or dyspnea. No cough or congestion.  In ED, her was febrile to 102F, as well as tachycardic and tachypneic, BP was normal range. Labs showed Na 129, K 5.3, CO2 18, BUN 102, Cr 13.8, alb 2.8, WBC 13.8, Hgb 13.8. CXR with RML pneumonia. COVID/Flu negative. Blood Cx collected and he was started on Ceftriaxone + azithromycin. There was also concern for a fall due to B scraped knees and L eye bruising, he had CT head and B knee xrays which did not show any acute abnormalities.  Seen in 5W bed. Denies cough, congestion, CP, dyspnea - although he appears to be heavily breathing. No abd pain, N/V/D.  Dialyzes on MWF schedule at [facility]. Missed HD yesterday, last dialyzed on 6/28. Denies any recent AVF issues.  Past Medical History:  Diagnosis Date   Anemia in chronic kidney disease (CKD)    CKD (chronic kidney disease) stage 5, GFR less than 15 ml/min (HCC)    M-W-F   Diabetes mellitus without complication (HCC) 03/2012   Hx type II - no meds, diet controlled, lost weight   ED (erectile dysfunction)    Heart murmur    not significant   History of kidney stones    passed 1 , 1 was removed with kidney disease in 2010   Hypercholesteremia    Hypertension    Iron deficiency    Kidney stone 2010   MVP (mitral valve prolapse)    Posterior with MR by ECHO   OSA (obstructive sleep apnea)    does not use cpap, lost weight    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   Syncope    Past Surgical History:  Procedure Laterality Date   A/V  FISTULAGRAM Left 02/10/2018   Procedure: A/V FISTULAGRAM;  Surgeon: Nada Libman, MD;  Location: [facility] INVAS[facility];  Service: Cardiovascular;  Laterality: Left;   APPLICATION OF WOUND VAC Right 03/15/2021   Procedure: APPLICATION OF WOUND VAC;  Surgeon: Leonie Douglas, MD;  Location: [facility] OR;  Service: Vascular;  Laterality: Right;   AV FISTULA PLACEMENT Left 06/14/2016   Procedure: ARTERIOVENOUS (AV) FISTULA CREATION LEFT ARM;  Surgeon: Chuck Hint, MD;  Location: [facility] OR;  Service: Vascular;  Laterality: Left;   AV FISTULA PLACEMENT Right 06/09/2018   Procedure: CREATION Right arm Brachiocephalic;  Surgeon: Maeola Harman, MD;  Location: [facility] OR;  Service: Vascular;  Laterality: Right;   AV FISTULA PLACEMENT Right 01/16/2023   Procedure: INSERTION OF RIGHT ARM ARTERIOVENOUS (AV) GORE-TEX GRAFT;  Surgeon: Leonie Douglas, MD;  Location: [facility] OR;  Service: Vascular;  Laterality: Right;   BASCILIC VEIN TRANSPOSITION Right 09/10/2018   Procedure: CEPHALIC VEIN TRANSPOSITION;  Surgeon: Maeola Harman, MD;  Location: [facility] OR;  Service: Vascular;  Laterality: Right;   BASCILIC VEIN TRANSPOSITION Left 01/25/2021   Procedure: LEFT ARMFIRST STAGE BASCILIC VEIN TRANSPOSITION;  Surgeon: Leonie Douglas, MD;  Location: [facility] OR;  Service: Vascular;  Laterality: Left;  PERIPHERAL NERVE BLOCK   BASCILIC VEIN TRANSPOSITION Left 03/15/2021   Procedure: LEFT UPPER  ARM SECOND STAGE BASILIC TRANSPOSITION;  Surgeon: Leonie Douglas, MD;  Location: [facility] OR;  Service: Vascular;  Laterality: Left;   COLONOSCOPY     INSERTION OF DIALYSIS CATHETER N/A 06/09/2018   Procedure: INSERTION OF DIALYSIS CATHETER;  Surgeon: Maeola Harman, MD;  Location: [facility] OR;  Service: Vascular;  Laterality: N/A;   JOINT REPLACEMENT Left 2010   hip   PARTIAL NEPHRECTOMY Left 2010   PERIPHERAL VASCULAR INTERVENTION Left 02/10/2018   Procedure: PERIPHERAL VASCULAR INTERVENTION;  Surgeon: Nada Libman, MD;   Location: [facility];  Service: Cardiovascular;  Laterality: Left;   TONSILLECTOMY     Family History  Problem Relation Age of Onset   Dementia Mother    Hypertension Father    Social History:  reports that he quit smoking about 36 years ago. His smoking use included cigarettes. His smokeless tobacco use includes chew. He reports that he does not drink alcohol and does not use drugs.  ROS: As per HPI otherwise negative.  Physical Exam: Vitals:   04/01/23 0915 04/01/23 1042 04/01/23 1100 04/01/23 1344  BP:   (!) 102/42 127/72  Pulse: (!) 40  74   Resp: (!) 41  (!) 32   Temp:  97.9 F (36.6 C)  97.9 F (36.6 C)  TempSrc:  Oral  Oral  SpO2: 97%  96%   Weight:      Height:         General: Well developed, breathing heavy although no acute distress. Head: Normocephalic, sclera non-icteric, mucus membranes are moist. L eyelid bruising Neck: Supple without lymphadenopathy/masses. JVD not elevated. Lungs: Clear bilaterally to auscultation without wheezes, rales, or rhonchi. Breathing is unlabored. Heart: RRR with normal S1, S2. No murmurs, rubs, or gallops appreciated. Abdomen: Soft, non-tender, non-distended with normoactive bowel sounds. Musculoskeletal:  Strength and tone appear normal for age. Lower extremities: Trace BLE edema, scabbed B knees and medial R ankle Neuro: Alert and oriented X 3. Moves all extremities spontaneously. Psych:  Responds to questions appropriately with a normal affect. Dialysis Access: RUE AVG + bruit  Allergies  Allergen Reactions   Penicillins Other (See Comments)    UNSPECIFIED REACTION Childhood allergy    Doxazosin Mesylate     ineffective for BP (10/2014)   Triamterene-Hctz     increased creatinine (see 09/2014 office note)   Prior to Admission medications   Medication Sig Start Date End Date Taking? Authorizing Provider  apixaban (ELIQUIS) 5 MG TABS tablet Take 1 tablet (5 mg total) by mouth 2 (two) times daily. 02/26/23   Rollene Rotunda, MD  apixaban (ELIQUIS) 5 MG TABS tablet Take 1 tablet (5 mg total) by mouth 2 (two) times daily. 02/26/23   Rollene Rotunda, MD  atorvastatin (LIPITOR) 10 MG tablet Take 10 mg by mouth at bedtime.    [provider]  AURYXIA 1 GM 210 MG(Fe) tablet Take 420-630 mg by mouth See admin instructions. Take 630mg  by mouth three times daily with meals and take 420mg  by mouth with snacks.    [provider]  cinacalcet (SENSIPAR) 90 MG tablet Take 180 mg by mouth daily.    [provider]  traMADol (ULTRAM) 50 MG tablet Take 1 tablet (50 mg total) by mouth every 6 (six) hours as needed. Patient not taking: Reported on 03/20/2023 01/16/23 01/16/24  Loel Dubonnet P, PA-C   Current Facility-Administered Medications  Medication Dose Route Frequency Provider Last Rate Last Admin   azithromycin (ZITHROMAX) 500 mg in sodium chloride  0.9 % 250 mL IVPB  500 mg Intravenous Q24H Curatolo, Adam, DO   Stopped at 04/01/23 1037   cefTRIAXone (ROCEPHIN) 2 g in sodium chloride 0.9 % 100 mL IVPB  2 g Intravenous Q24H Curatolo, Adam, DO   Stopped at 04/01/23 1007   pantoprazole (PROTONIX) EC tablet 40 mg  40 mg Oral Q0600 Kathlen Mody, MD       Labs: Basic Metabolic Panel: Recent Labs  Lab 04/01/23 0851  NA 129*  K 5.3*  CL 91*  CO2 18*  GLUCOSE 136*  BUN 102*  CREATININE 13.88*  CALCIUM 9.8   Liver Function Tests: Recent Labs  Lab 04/01/23 0851  AST 147*  ALT 56*  ALKPHOS 47  BILITOT 1.1  PROT 7.4  ALBUMIN 2.8*   CBC: Recent Labs  Lab 04/01/23 0851  WBC 13.8*  NEUTROABS 12.1*  HGB 13.8  HCT 40.6  MCV 98.8  PLT 85*   Studies/Results: DG Shoulder Left  Result Date: 04/01/2023 CLINICAL DATA:  Pain after fall EXAM: LEFT SHOULDER - 2 VIEW COMPARISON:  None Available. FINDINGS: Osteopenia. Osteophyte formation of the glenohumeral joint and AC joint with some joint space loss. There is also elevation of the humeral head relative to the acromion. Please correlate for  a full-thickness rotator cuff tear. No acute fracture or dislocation. Surgical clips in the left axillary region. IMPRESSION: Moderate degenerative changes. High-riding humeral head. Please correlate for a full-thickness rotator cuff tear Electronically Signed   By: Karen Kays M.D.   On: 04/01/2023 11:04   DG Knee Complete 4 Views Right  Result Date: 04/01/2023 CLINICAL DATA:  Pain after fall EXAM: RIGHT KNEE - COMPLETE 4 VIEW COMPARISON:  None Available. FINDINGS: Moderate joint space loss of the medial compartment. Small osteophytes seen of all 3 compartments greatest of the patellofemoral joint. There is a moderate joint effusion. Hyperostosis. Osteopenia. No fracture or dislocation. IMPRESSION: Tricompartment degenerative changes identified, overall moderate greatest involving the medial compartment and patellofemoral joint. Moderate joint effusion of uncertain etiology. This has a broad differential. This could be related to the degenerative changes. However please correlate for history of trauma or infection. Electronically Signed   By: Karen Kays M.D.   On: 04/01/2023 11:03   DG Knee Complete 4 Views Left  Result Date: 04/01/2023 CLINICAL DATA:  Pain after fall.  Weakness for 2 days EXAM: LEFT KNEE - COMPLETE 4 VIEW COMPARISON:  None Available. FINDINGS: Osteopenia. Slight joint space loss of the medial compartment. Small osteophytes of all 3 compartments. No joint effusion on lateral view. No fracture or dislocation. Hyperostosis of the patella. Vascular calcifications. IMPRESSION: Mild degenerative changes.  No joint effusion. Electronically Signed   By: Karen Kays M.D.   On: 04/01/2023 11:00   CT Head Wo Contrast  Result Date: 04/01/2023 CLINICAL DATA:  Provided history: Polytrauma, blunt. EXAM: CT HEAD WITHOUT CONTRAST CT CERVICAL SPINE WITHOUT CONTRAST TECHNIQUE: Multidetector CT imaging of the head and cervical spine was performed following the standard protocol without intravenous  contrast. Multiplanar CT image reconstructions of the cervical spine were also generated. RADIATION DOSE REDUCTION: This exam was performed according to the departmental dose-optimization program which includes automated exposure control, adjustment of the mA and/or kV according to patient size and/or use of iterative reconstruction technique. COMPARISON:  None. FINDINGS: CT HEAD FINDINGS Brain: Mild generalized parenchymal atrophy. There is no acute intracranial hemorrhage. No demarcated cortical infarct. No extra-axial fluid collection. No evidence of an intracranial mass. No midline shift. Vascular: No hyperdense  vessel.  Atherosclerotic calcifications. Skull: No calvarial fracture or aggressive osseous lesion. Sinuses/Orbits: No mass or acute finding within the imaged orbits. Mild-to-moderate mucosal thickening, and small volume frothy secretions, within the right maxillary sinus. Small mucous retention cyst within the left maxillary sinus. CT CERVICAL SPINE FINDINGS Alignment: No significant spondylolisthesis. Skull base and vertebrae: The basion-dental and atlanto-dental intervals are maintained.No evidence of acute fracture to the cervical spine. Soft tissues and spinal canal: No prevertebral fluid or swelling. No visible canal hematoma. Disc levels: Cervical spondylosis with multilevel disc space narrowing, disc bulges/central disc protrusions, endplate spurring, uncovertebral hypertrophy and facet arthrosis. Disc space narrowing is greatest at C5-C6 (moderate/advanced), C7-T1 (moderate/advanced), T1-T2 (advanced) and T2-T3 (advanced). 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 Most notably at C2-C3 and C3-C4, central disc protrusions contribute to at least moderate spinal canal stenosis. Multilevel bony neural foraminal narrowing. Upper chest: No consolidation within the imaged lung apices. No visible pneumothorax. IMPRESSION: CT head: 1.  No evidence  of an acute intracranial abnormality. 2. Mild generalized parenchymal atrophy. 3. Paranasal sinus disease as described. CT cervical spine: 1. No evidence of an acute cervical spine fracture. 2. Cervical spondylosis as outlined. Notably at C2-C3 and C3-C4, central disc protrusions contribute to at least moderate spinal canal stenosis. Multilevel bony neural from narrowing. Electronically Signed   By: Jackey Loge D.O.   On: 04/01/2023 10:32   CT Cervical Spine Wo Contrast  Result Date: 04/01/2023 CLINICAL DATA:  Provided history: Polytrauma, blunt. EXAM: CT HEAD WITHOUT CONTRAST CT CERVICAL SPINE WITHOUT CONTRAST TECHNIQUE: Multidetector CT imaging of the head and cervical spine was performed following the standard protocol without intravenous contrast. Multiplanar CT image reconstructions of the cervical spine were also generated. RADIATION DOSE REDUCTION: This exam was performed according to the departmental dose-optimization program which includes automated exposure control, adjustment of the mA and/or kV according to patient size and/or use of iterative reconstruction technique. COMPARISON:  None. FINDINGS: CT HEAD FINDINGS Brain: Mild generalized parenchymal atrophy. There is no acute intracranial hemorrhage. No demarcated cortical infarct. No extra-axial fluid collection. No evidence of an intracranial mass. No midline shift. Vascular: No hyperdense vessel.  Atherosclerotic calcifications. Skull: No calvarial fracture or aggressive osseous lesion. Sinuses/Orbits: No mass or acute finding within the imaged orbits. Mild-to-moderate mucosal thickening, and small volume frothy secretions, within the right maxillary sinus. Small mucous retention cyst within the left maxillary sinus. CT CERVICAL SPINE FINDINGS Alignment: No significant spondylolisthesis. Skull base and vertebrae: The basion-dental and atlanto-dental intervals are maintained.No evidence of acute fracture to the cervical spine. Soft tissues and  spinal canal: No prevertebral fluid or swelling. No visible canal hematoma. Disc levels: Cervical spondylosis with multilevel disc space narrowing, disc bulges/central disc protrusions, endplate spurring, uncovertebral hypertrophy and facet arthrosis. Disc space narrowing is greatest at C5-C6 (moderate/advanced), C7-T1 (moderate/advanced), T1-T2 (advanced) and T2-T3 (advanced). 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 Most notably at C2-C3 and C3-C4, central disc protrusions contribute to at least moderate spinal canal stenosis. Multilevel bony neural foraminal narrowing. Upper chest: No consolidation within the imaged lung apices. No visible pneumothorax. IMPRESSION: CT head: 1.  No evidence of an acute intracranial abnormality. 2. Mild generalized parenchymal atrophy. 3. Paranasal sinus disease as described. CT cervical spine: 1. No evidence of an acute cervical spine fracture. 2. Cervical spondylosis as outlined. Notably at C2-C3 and C3-C4, central disc protrusions contribute to at least moderate spinal canal stenosis. Multilevel bony neural  from narrowing. Electronically Signed   By: Jackey Loge D.O.   On: 04/01/2023 10:32   DG Chest Portable 1 View  Result Date: 04/01/2023 CLINICAL DATA:  Shortness of breath EXAM: PORTABLE CHEST 1 VIEW COMPARISON:  Chest radiograph dated 06/09/2018 FINDINGS: The medial apices are obscured due to overlying mandible. Low lung volumes with bronchovascular crowding. Medial right lower lung patchy opacities. No pleural effusion or pneumothorax. Similar enlarged cardiomediastinal silhouette. No acute osseous abnormality. IMPRESSION: 1. Medial right lower lung patchy opacities, which may represent atelectasis, aspiration, or pneumonia. 2. Similar cardiomegaly. Electronically Signed   By: Agustin Cree M.D.   On: 04/01/2023 08:50    Dialysis Orders:  MWF at [facility] 4hr, 450/800, EDW 97.5kg, 3K/2Ca, AVG, 15g, heparin  10,000 bolus - no ESA, Hgb > 12 - No VDRA (recently stopped d/t Ca 10.9)  Assessment/Plan:  Pneumonia: ?Possible aspiration; started on Ceftriaxone + azithromycin. Per primary.  ESRD:  Usual MWF schedule - missed yesterday, will dialyze tonight.  Hypertension/volume: BP soft, some LE edema - aim for 2.5L UFG tonight.  Anemia: Hgb > 12, no ESA  Metabolic bone disease: Ca ok, Phos pending - continue home binders  Nutrition: Adding protein supps  T2DM  A-Fib  Elevated LFTs  Ozzie Hoyle, PA-C 04/01/2023, 2:17 PM  [employer]

## 2023-04-01 NOTE — Sepsis Progress Note (Signed)
Sepsis protocol monitored by eLink ?

## 2023-04-01 NOTE — Progress Notes (Signed)
Pt receives out-pt HD at [facility] on MWF. Will assist as needed.   Edvin Albus Renal Navigator [phone] 

## 2023-04-01 NOTE — Progress Notes (Addendum)
PHARMACY - PHYSICIAN COMMUNICATION CRITICAL VALUE ALERT - BLOOD CULTURE IDENTIFICATION (BCID)  Peter [name] is an 72 y.o. male who presented to [facility] on 04/01/2023  Assessment:  23 yom with hx ESRD on HD (missed session 7/1 and having off-schedule session 7/2 PM) presenting with weakness, SOB, s/p fall and R knee pain. Initiated on antibiotics for possible PNA. Now 2 of 4 blood culture bottles growing MSSA.  Name of physician (or Provider) Contacted: Opyd, T [facility])  Current antibiotics: ceftriaxone/azithromycin  Changes to prescribed antibiotics recommended:  De-escalate to cefazolin 1g IV q24h (scheduled post-HD), d/c ceftriaxone/azithromycin per provider  Results for orders placed or performed during the hospital encounter of 04/01/23  Blood Culture ID Panel (Reflexed) (Collected: 04/01/2023  8:51 AM)  Result Value Ref Range   Enterococcus faecalis NOT DETECTED NOT DETECTED   Enterococcus Faecium NOT DETECTED NOT DETECTED   Listeria monocytogenes NOT DETECTED NOT DETECTED   Staphylococcus species DETECTED (A) NOT DETECTED   Staphylococcus aureus (BCID) DETECTED (A) NOT DETECTED   Staphylococcus epidermidis NOT DETECTED NOT DETECTED   Staphylococcus lugdunensis NOT DETECTED NOT DETECTED   Streptococcus species NOT DETECTED NOT DETECTED   Streptococcus agalactiae NOT DETECTED NOT DETECTED   Streptococcus pneumoniae NOT DETECTED NOT DETECTED   Streptococcus pyogenes NOT DETECTED NOT DETECTED   A.calcoaceticus-baumannii NOT DETECTED NOT DETECTED   Bacteroides fragilis NOT DETECTED NOT DETECTED   Enterobacterales NOT DETECTED NOT DETECTED   Enterobacter cloacae complex NOT DETECTED NOT DETECTED   Escherichia coli NOT DETECTED NOT DETECTED   Klebsiella aerogenes NOT DETECTED NOT DETECTED   Klebsiella oxytoca NOT DETECTED NOT DETECTED   Klebsiella pneumoniae NOT DETECTED NOT DETECTED   Proteus species NOT DETECTED NOT DETECTED   Salmonella species NOT DETECTED NOT DETECTED    Serratia marcescens NOT DETECTED NOT DETECTED   Haemophilus influenzae NOT DETECTED NOT DETECTED   Neisseria meningitidis NOT DETECTED NOT DETECTED   Pseudomonas aeruginosa NOT DETECTED NOT DETECTED   Stenotrophomonas maltophilia NOT DETECTED NOT DETECTED   Candida albicans NOT DETECTED NOT DETECTED   Candida auris NOT DETECTED NOT DETECTED   Candida glabrata NOT DETECTED NOT DETECTED   Candida krusei NOT DETECTED NOT DETECTED   Candida parapsilosis NOT DETECTED NOT DETECTED   Candida tropicalis NOT DETECTED NOT DETECTED   Cryptococcus neoformans/gattii NOT DETECTED NOT DETECTED   Meth resistant mecA/C and MREJ NOT DETECTED NOT DETECTED    Leia Alf, PharmD, BCPS Please check AMION for all [facility] Pharmacy contact numbers Clinical Pharmacist 04/01/2023 8:28 PM

## 2023-04-01 NOTE — Progress Notes (Signed)
Patient Hepatitis B lab information entered at this time into the spreadsheet for HD is from records of the patient's outpatient clinic, [name].

## 2023-04-01 NOTE — Progress Notes (Signed)
   04/01/23 2010  Vitals  Temp (!) 97.3 F (36.3 C)  Temp Source Oral  BP (!) 142/63  BP Location Left Arm  BP Method Automatic  Patient Position (if appropriate) Lying  Pulse Rate 95  Pulse Rate Source Monitor  Resp (!) 26  Oxygen Therapy  SpO2 99 %  O2 Device Room Air  During Treatment Monitoring  Intra-Hemodialysis Comments Tx completed;Tolerated well  Post Treatment  Dialyzer Clearance Lightly streaked  Duration of HD Treatment -hour(s) 4 hour(s)  Hemodialysis Intake (mL) 0 mL  Liters Processed 93.2  Fluid Removed (mL) 1500 mL  Tolerated HD Treatment Yes  Post-Hemodialysis Comments pt stable  AVG/AVF Arterial Site Held (minutes) 10 minutes  AVG/AVF Venous Site Held (minutes) 10 minutes

## 2023-04-01 NOTE — ED Notes (Signed)
Pt remains in imaging.

## 2023-04-01 NOTE — ED Notes (Signed)
Phlebotomy unable to obtain 2nd set of cultures

## 2023-04-01 NOTE — Progress Notes (Signed)
Received patient in bed to unit. Yes Alert and oriented. CAO x3 Informed consent signed and in chart. Yes  TX duration:4 hours  Patient tolerated well.yes  Transported back to the room 5 west 21 Alert, without acute distress. yes  Hand-off given to patient's nurse. Christiana Fuchs RN  Access used: Rt upper arm fistula Access issues: none  Total UF removed: 1500 Medication(s) given: tylenol 650mg  Post HD VS: 142/63 HR102 spo2 100% Post HD weight: 96.1kg   [name]

## 2023-04-01 NOTE — Procedures (Signed)
HD Note:  Some information was entered later than the data was gathered due to patient care needs. The stated time with the data is accurate.  Received patient in bed to unit.  Alert and oriented to self and situation.  Unable to recall how he became injured. Informed consent signed and in chart.   TX duration:4 hours  Patient in pain when he arrived to the unit. Yelled out when moving either arm.  Made as comfortable as possible by staff.  Patient heart rhythm varied with A-Fib, PVC, and VT during his treatment.Heart rate was elevated  Patient was asymptomatic.   Access used: Upper Right Arm graft Access issues: None.  Bruit and Thrill are present with no drainage of signs of infection.  Hand off to oncoming dialysis nurse completed at 1840    [facility]

## 2023-04-01 NOTE — ED Notes (Signed)
ED TO INPATIENT HANDOFF REPORT  ED Nurse Name and Phone #:   S Name/Age/Gender Peter [name] 72 y.o. male Room/Bed: 009C/009C  Code Status   Code Status: Prior  Home/SNF/Other Home Patient oriented to: self, place, time, and situation Is this baseline? Yes   Triage Complete: Triage complete  Chief Complaint Aspiration pneumonia (HCC) [J69.0]  Triage Note Per EMS, Pt, from home, c/o fever, n/v, and weakness x2 days.  Denies pain.  Pt reports missing dialysis yesterday.  Sts he has not vomited since Sunday, but has not been eating.    Allergies Allergies  Allergen Reactions   Penicillins Other (See Comments)    UNSPECIFIED REACTION Childhood allergy    Doxazosin Mesylate     ineffective for BP (10/2014)   Triamterene-Hctz     increased creatinine (see 09/2014 office note)    Level of Care/Admitting Diagnosis ED Disposition     ED Disposition  Admit   Condition  --   Comment  Hospital Area: [facility] [[id number]]  Level of Care: Telemetry Medical [[id number]]  May admit patient to [facility] or [facility] if equivalent level of care is available:: No  Covid Evaluation: Confirmed COVID Negative  Diagnosis: Aspiration pneumonia [facility]) [[id number]]  Admitting Physician: Kathlen Mody [[id number]]  Attending Physician: Kathlen Mody [[id number]]  Certification:: I certify this patient will need inpatient services for at least 2 midnights  Estimated Length of Stay: 2          B Medical/Surgery History Past Medical History:  Diagnosis Date   Anemia in chronic kidney disease (CKD)    CKD (chronic kidney disease) stage 5, GFR less than 15 ml/min (HCC)    M-W-F   Diabetes mellitus without complication (HCC) 03/2012   Hx type II - no meds, diet controlled, lost weight   ED (erectile dysfunction)    Heart murmur    not significant   History of kidney stones    passed 1 , 1 was removed with kidney disease in 2010   Hypercholesteremia    Hypertension    Iron  deficiency    Kidney stone 2010   MVP (mitral valve prolapse)    Posterior with MR by ECHO   OSA (obstructive sleep apnea)    does not use cpap, lost weight    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   Syncope    Past Surgical History:  Procedure Laterality Date   A/V FISTULAGRAM Left 02/10/2018   Procedure: A/V FISTULAGRAM;  Surgeon: Nada Libman, MD;  Location: [facility] INVASIVE CV LAB;  Service: Cardiovascular;  Laterality: Left;   APPLICATION OF WOUND VAC Right 03/15/2021   Procedure: APPLICATION OF WOUND VAC;  Surgeon: Leonie Douglas, MD;  Location: [facility] OR;  Service: Vascular;  Laterality: Right;   AV FISTULA PLACEMENT Left 06/14/2016   Procedure: ARTERIOVENOUS (AV) FISTULA CREATION LEFT ARM;  Surgeon: Chuck Hint, MD;  Location[facility] OR;  Service: Vascular;  Laterality: Left;   AV FISTULA PLACEMENT Right 06/09/2018   Procedure: CREATION Right arm Brachiocephalic;  Surgeon: Maeola Harman, MD;  Location: [facility] OR;  Service: Vascular;  Laterality: Right;   AV FISTULA PLACEMENT Right 01/16/2023   Procedure: INSERTION OF RIGHT ARM ARTERIOVENOUS (AV) GORE-TEX GRAFT;  Surgeon: Leonie Douglas, MD;  Location: [facility] OR;  Service: Vascular;  Laterality: Right;   BASCILIC VEIN TRANSPOSITION Right 09/10/2018   Procedure: CEPHALIC VEIN TRANSPOSITION;  Surgeon: Maeola Harman, MD;  Location: [facility]  OR;  Service: Vascular;  Laterality: Right;   BASCILIC VEIN TRANSPOSITION Left 01/25/2021   Procedure: LEFT ARMFIRST STAGE BASCILIC VEIN TRANSPOSITION;  Surgeon: Leonie Douglas, MD;  Location: [facility] OR;  Service: Vascular;  Laterality: Left;  PERIPHERAL NERVE BLOCK   BASCILIC VEIN TRANSPOSITION Left 03/15/2021   Procedure: LEFT UPPER ARM SECOND STAGE BASILIC TRANSPOSITION;  Surgeon: Leonie Douglas, MD;  Location: [facility] OR;  Service: Vascular;  Laterality: Left;   COLONOSCOPY     INSERTION OF DIALYSIS CATHETER N/A 06/09/2018   Procedure: INSERTION OF DIALYSIS CATHETER;   Surgeon: Maeola Harman, MD;  Location: [facility] OR;  Service: Vascular;  Laterality: N/A;   JOINT REPLACEMENT Left 2010   hip   PARTIAL NEPHRECTOMY Left 2010   PERIPHERAL VASCULAR INTERVENTION Left 02/10/2018   Procedure: PERIPHERAL VASCULAR INTERVENTION;  Surgeon: Nada Libman, MD;  [facility]: [facility];  Service: Cardiovascular;  Laterality: Left;   TONSILLECTOMY       A IV Location/Drains/Wounds Patient Lines/Drains/Airways Status     Active Line/Drains/Airways     Name Placement date Placement time Site Days   Peripheral IV 04/01/23 20 G 1.25" Anterior;Left;Proximal Forearm 04/01/23  0852  Forearm  less than 1   Fistula / Graft Left Arteriovenous fistula 01/25/21  0803  --  796   Fistula / Graft Left Upper arm Arteriovenous fistula 03/15/21  0833  Upper arm  747   Fistula / Graft Right Upper arm Arteriovenous vein graft 01/16/23  1122  Upper arm  75   Hemodialysis Catheter Right Internal jugular Permanent 06/09/18  1309  Internal jugular  1757   Negative Pressure Wound Therapy Arm Anterior;Left;Upper 03/15/21  1002  --  747            Intake/Output Last 24 hours No intake or output data in the 24 hours ending 04/01/23 1120  Labs/Imaging Results for orders placed or performed during the hospital encounter of 04/01/23 (from the past 48 hour(s))  Resp panel by RT-PCR (RSV, Flu A&B, Covid) Anterior Nasal Swab     Status: None   Collection Time: 04/01/23  8:23 AM   Specimen: Anterior Nasal Swab  Result Value Ref Range   SARS Coronavirus 2 by RT PCR NEGATIVE NEGATIVE   Influenza A by PCR NEGATIVE NEGATIVE   Influenza B by PCR NEGATIVE NEGATIVE    Comment: (NOTE) The Xpert Xpress SARS-CoV-2/FLU/RSV plus assay is intended as an aid in the diagnosis of influenza from Nasopharyngeal swab specimens and should not be used as a sole basis for treatment. Nasal washings and aspirates are unacceptable for Xpert Xpress SARS-CoV-2/FLU/RSV testing.  Fact Sheet for  Patients: BloggerCourse.com  Fact Sheet for Healthcare Providers: SeriousBroker.it  This test is not yet approved or cleared by the [place] FDA and has been authorized for detection and/or diagnosis of SARS-CoV-2 by FDA under an Emergency Use Authorization (EUA). This EUA will remain in effect (meaning this test can be used) for the duration of the COVID-19 declaration under Section 564(b)(1) of the Act, 21 U.S.C. section 360bbb-3(b)(1), unless the authorization is terminated or revoked.     Resp Syncytial Virus by PCR NEGATIVE NEGATIVE    Comment: (NOTE) Fact Sheet for Patients: BloggerCourse.com  Fact Sheet for Healthcare Providers: SeriousBroker.it  This test is not yet approved or cleared by the [place] FDA and has been authorized for detection and/or diagnosis of SARS-CoV-2 by FDA under an Emergency Use Authorization (EUA). This EUA will remain in effect (meaning this test  can be used) for the duration of the COVID-19 declaration under Section 564(b)(1) of the Act, 21 U.S.C. section 360bbb-3(b)(1), unless the authorization is terminated or revoked.  Performed at [facility], [address] [place], [place] [address]   Lactic acid, plasma     Status: Abnormal   Collection Time: 04/01/23  8:51 AM  Result Value Ref Range   Lactic Acid, Venous 4.6 (HH) 0.5 - 1.9 mmol/L    Comment: CRITICAL RESULT CALLED TO, READ BACK BY AND VERIFIED WITH CHENEY, A. ,RN @ 09:58 07.02.24 JLASIGAN Performed at [facility], [address] [place], [place] [address]   Comprehensive metabolic panel     Status: Abnormal   Collection Time: 04/01/23  8:51 AM  Result Value Ref Range   Sodium 129 (L) 135 - 145 mmol/L   Potassium 5.3 (H) 3.5 - 5.1 mmol/L   Chloride 91 (L) 98 - 111 mmol/L   CO2 18 (L) 22 - 32 mmol/L   Glucose, Bld 136 (H) 70 - 99 mg/dL    Comment: Glucose  reference range applies only to samples taken after fasting for at least 8 hours.   BUN 102 (H) 8 - 23 mg/dL   Creatinine, Ser 09.81 (H) 0.61 - 1.24 mg/dL   Calcium 9.8 8.9 - 19.1 mg/dL   Total Protein 7.4 6.5 - 8.1 g/dL   Albumin 2.8 (L) 3.5 - 5.0 g/dL   AST 478 (H) 15 - 41 U/L   ALT 56 (H) 0 - 44 U/L   Alkaline Phosphatase 47 38 - 126 U/L   Total Bilirubin 1.1 0.3 - 1.2 mg/dL   GFR, Estimated 3 (L) >60 mL/min    Comment: (NOTE) Calculated using the CKD-EPI Creatinine Equation (2021)    Anion gap 20 (H) 5 - 15    Comment: Performed at [facility], [address] [place], [place] [address]  CBC with Differential     Status: Abnormal   Collection Time: 04/01/23  8:51 AM  Result Value Ref Range   WBC 13.8 (H) 4.0 - 10.5 K/uL   RBC 4.11 (L) 4.22 - 5.81 MIL/uL   Hemoglobin 13.8 13.0 - 17.0 g/dL   HCT 13.0 86.5 - 78.4 %   MCV 98.8 80.0 - 100.0 fL   MCH 33.6 26.0 - 34.0 pg   MCHC 34.0 30.0 - 36.0 g/dL   RDW 69.6 29.5 - 28.4 %   Platelets 85 (L) 150 - 400 K/uL    Comment: Immature Platelet Fraction may be clinically indicated, consider ordering this additional test [id number] REPEATED TO VERIFY    nRBC 0.0 0.0 - 0.2 %   Neutrophils Relative % 88 %   Neutro Abs 12.1 (H) 1.7 - 7.7 K/uL   Lymphocytes Relative 4 %   Lymphs Abs 0.6 (L) 0.7 - 4.0 K/uL   Monocytes Relative 6 %   Monocytes Absolute 0.9 0.1 - 1.0 K/uL   Eosinophils Relative 0 %   Eosinophils Absolute 0.0 0.0 - 0.5 K/uL   Basophils Relative 0 %   Basophils Absolute 0.0 0.0 - 0.1 K/uL   WBC Morphology Mild Left Shift (1-5% metas, occ myelo)    RBC Morphology MORPHOLOGY UNREMARKABLE    Smear Review MORPHOLOGY UNREMARKABLE    Immature Granulocytes 2 %   Abs Immature Granulocytes 0.21 (H) 0.00 - 0.07 K/uL    Comment: Performed at [facility], [address] [place], [place] [address]  Protime-INR     Status:  Abnormal   Collection Time: 04/01/23  8:51 AM  Result Value Ref Range   Prothrombin Time 19.0  (H) 11.4 - 15.2 seconds   INR 1.6 (H) 0.8 - 1.2    Comment: (NOTE) INR goal varies based on device and disease states. Performed at [facility], [address], [place], [place] [address]   APTT     Status: None   Collection Time: 04/01/23  8:51 AM  Result Value Ref Range   aPTT 33 24 - 36 seconds    Comment: Performed at [facility], [address], [place], [place] [address]   DG Shoulder Left  Result Date: 04/01/2023 CLINICAL DATA:  Pain after fall EXAM: LEFT SHOULDER - 2 VIEW COMPARISON:  None Available. FINDINGS: Osteopenia. Osteophyte formation of the glenohumeral joint and AC joint with some joint space loss. There is also elevation of the humeral head relative to the acromion. Please correlate for a full-thickness rotator cuff tear. No acute fracture or dislocation. Surgical clips in the left axillary region. IMPRESSION: Moderate degenerative changes. High-riding humeral head. Please correlate for a full-thickness rotator cuff tear Electronically Signed   By: Karen Kays M.D.   On: 04/01/2023 11:04   DG Knee Complete 4 Views Right  Result Date: 04/01/2023 CLINICAL DATA:  Pain after fall EXAM: RIGHT KNEE - COMPLETE 4 VIEW COMPARISON:  None Available. FINDINGS: Moderate joint space loss of the medial compartment. Small osteophytes seen of all 3 compartments greatest of the patellofemoral joint. There is a moderate joint effusion. Hyperostosis. Osteopenia. No fracture or dislocation. IMPRESSION: Tricompartment degenerative changes identified, overall moderate greatest involving the medial compartment and patellofemoral joint. Moderate joint effusion of uncertain etiology. This has a broad differential. This could be related to the degenerative changes. However please correlate for history of trauma or infection. Electronically Signed   By: Karen Kays M.D.   On: 04/01/2023 11:03   DG Knee Complete 4 Views Left  Result Date: 04/01/2023 CLINICAL DATA:  Pain after fall.   Weakness for 2 days EXAM: LEFT KNEE - COMPLETE 4 VIEW COMPARISON:  None Available. FINDINGS: Osteopenia. Slight joint space loss of the medial compartment. Small osteophytes of all 3 compartments. No joint effusion on lateral view. No fracture or dislocation. Hyperostosis of the patella. Vascular calcifications. IMPRESSION: Mild degenerative changes.  No joint effusion. Electronically Signed   By: Karen Kays M.D.   On: 04/01/2023 11:00   CT Head Wo Contrast  Result Date: 04/01/2023 CLINICAL DATA:  Provided history: Polytrauma, blunt. EXAM: CT HEAD WITHOUT CONTRAST CT CERVICAL SPINE WITHOUT CONTRAST TECHNIQUE: Multidetector CT imaging of the head and cervical spine was performed following the standard protocol without intravenous contrast. Multiplanar CT image reconstructions of the cervical spine were also generated. RADIATION DOSE REDUCTION: This exam was performed according to the departmental dose-optimization program which includes automated exposure control, adjustment of the mA and/or kV according to patient size and/or use of iterative reconstruction technique. COMPARISON:  None. FINDINGS: CT HEAD FINDINGS Brain: Mild generalized parenchymal atrophy. There is no acute intracranial hemorrhage. No demarcated cortical infarct. No extra-axial fluid collection. No evidence of an intracranial mass. No midline shift. Vascular: No hyperdense vessel.  Atherosclerotic calcifications. Skull: No calvarial fracture or aggressive osseous lesion. Sinuses/Orbits: No mass or acute finding within the imaged orbits. Mild-to-moderate mucosal thickening, and small volume frothy secretions, within the right maxillary sinus. Small mucous retention cyst within the left maxillary sinus. CT CERVICAL SPINE FINDINGS Alignment: No significant spondylolisthesis. Skull base  and vertebrae: The basion-dental and atlanto-dental intervals are maintained.No evidence of acute fracture to the cervical spine. Soft tissues and spinal canal: No  prevertebral fluid or swelling. No visible canal hematoma. Disc levels: Cervical spondylosis with multilevel disc space narrowing, disc bulges/central disc protrusions, endplate spurring, uncovertebral hypertrophy and facet arthrosis. Disc space narrowing is greatest at C5-C6 (moderate/advanced), C7-T1 (moderate/advanced), T1-T2 (advanced) and T2-T3 (advanced). 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 Most notably at C2-C3 and C3-C4, central disc protrusions contribute to at least moderate spinal canal stenosis. Multilevel bony neural foraminal narrowing. Upper chest: No consolidation within the imaged lung apices. No visible pneumothorax. IMPRESSION: CT head: 1.  No evidence of an acute intracranial abnormality. 2. Mild generalized parenchymal atrophy. 3. Paranasal sinus disease as described. CT cervical spine: 1. No evidence of an acute cervical spine fracture. 2. Cervical spondylosis as outlined. Notably at C2-C3 and C3-C4, central disc protrusions contribute to at least moderate spinal canal stenosis. Multilevel bony neural from narrowing. Electronically Signed   By: Jackey Loge D.O.   On: 04/01/2023 10:32   CT Cervical Spine Wo Contrast  Result Date: 04/01/2023 CLINICAL DATA:  Provided history: Polytrauma, blunt. EXAM: CT HEAD WITHOUT CONTRAST CT CERVICAL SPINE WITHOUT CONTRAST TECHNIQUE: Multidetector CT imaging of the head and cervical spine was performed following the standard protocol without intravenous contrast. Multiplanar CT image reconstructions of the cervical spine were also generated. RADIATION DOSE REDUCTION: This exam was performed according to the departmental dose-optimization program which includes automated exposure control, adjustment of the mA and/or kV according to patient size and/or use of iterative reconstruction technique. COMPARISON:  None. FINDINGS: CT HEAD FINDINGS Brain: Mild generalized parenchymal atrophy. There is no  acute intracranial hemorrhage. No demarcated cortical infarct. No extra-axial fluid collection. No evidence of an intracranial mass. No midline shift. Vascular: No hyperdense vessel.  Atherosclerotic calcifications. Skull: No calvarial fracture or aggressive osseous lesion. Sinuses/Orbits: No mass or acute finding within the imaged orbits. Mild-to-moderate mucosal thickening, and small volume frothy secretions, within the right maxillary sinus. Small mucous retention cyst within the left maxillary sinus. CT CERVICAL SPINE FINDINGS Alignment: No significant spondylolisthesis. Skull base and vertebrae: The basion-dental and atlanto-dental intervals are maintained.No evidence of acute fracture to the cervical spine. Soft tissues and spinal canal: No prevertebral fluid or swelling. No visible canal hematoma. Disc levels: Cervical spondylosis with multilevel disc space narrowing, disc bulges/central disc protrusions, endplate spurring, uncovertebral hypertrophy and facet arthrosis. Disc space narrowing is greatest at C5-C6 (moderate/advanced), C7-T1 (moderate/advanced), T1-T2 (advanced) and T2-T3 (advanced). 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 Most notably at C2-C3 and C3-C4, central disc protrusions contribute to at least moderate spinal canal stenosis. Multilevel bony neural foraminal narrowing. Upper chest: No consolidation within the imaged lung apices. No visible pneumothorax. IMPRESSION: CT head: 1.  No evidence of an acute intracranial abnormality. 2. Mild generalized parenchymal atrophy. 3. Paranasal sinus disease as described. CT cervical spine: 1. No evidence of an acute cervical spine fracture. 2. Cervical spondylosis as outlined. Notably at C2-C3 and C3-C4, central disc protrusions contribute to at least moderate spinal canal stenosis. Multilevel bony neural from narrowing. Electronically Signed   By: Jackey Loge D.O.   On: 04/01/2023 10:32    DG Chest Portable 1 View  Result Date: 04/01/2023 CLINICAL DATA:  Shortness of breath EXAM: PORTABLE CHEST 1 VIEW COMPARISON:  Chest radiograph dated 06/09/2018 FINDINGS: The medial apices are obscured due to overlying  mandible. Low lung volumes with bronchovascular crowding. Medial right lower lung patchy opacities. No pleural effusion or pneumothorax. Similar enlarged cardiomediastinal silhouette. No acute osseous abnormality. IMPRESSION: 1. Medial right lower lung patchy opacities, which may represent atelectasis, aspiration, or pneumonia. 2. Similar cardiomegaly. Electronically Signed   By: Agustin Cree M.D.   On: 04/01/2023 08:50    Pending Labs Unresulted Labs (From admission, onward)     Start     Ordered   04/01/23 1610  Lactic acid, plasma  (Septic presentation on arrival (screening labs, nursing and treatment orders for obvious sepsis))  Now then every 2 hours,   R (with STAT occurrences)      04/01/23 0823   04/01/23 0823  Blood Culture (routine x 2)  (Septic presentation on arrival (screening labs, nursing and treatment orders for obvious sepsis))  BLOOD CULTURE X 2,   STAT      04/01/23 0823   04/01/23 0823  Urinalysis, w/ Reflex to Culture (Infection Suspected) -Urine, Clean Catch  (Septic presentation on arrival (screening labs, nursing and treatment orders for obvious sepsis))  Once,   URGENT       Question:  Specimen Source  Answer:  Urine, Clean Catch   04/01/23 0823            Vitals/Pain Today's Vitals   04/01/23 0915 04/01/23 1042 04/01/23 1042 04/01/23 1100  BP:    (!) 102/42  Pulse: (!) 40   74  Resp: (!) 41   (!) 32  Temp:  97.9 F (36.6 C)    TempSrc:  Oral    SpO2: 97%   96%  Weight:      Height:      PainSc:   6      Isolation Precautions No active isolations  Medications Medications  cefTRIAXone (ROCEPHIN) 2 g in sodium chloride 0.9 % 100 mL IVPB (0 g Intravenous Stopped 04/01/23 1007)  azithromycin (ZITHROMAX) 500 mg in sodium chloride 0.9 % 250  mL IVPB (0 mg Intravenous Stopped 04/01/23 1037)  acetaminophen (TYLENOL) tablet 1,000 mg (1,000 mg Oral Given 04/01/23 0903)  ondansetron (ZOFRAN) injection 4 mg (4 mg Intravenous Given 04/01/23 0905)  sodium chloride 0.9 % bolus 1,000 mL (1,000 mLs Intravenous New Bag/Given 04/01/23 1054)    Mobility walks     Focused Assessments Renal Assessment Handoff:  Hemodialysis Schedule: Hemodialysis Schedule: Monday/Wednesday/Friday Last Hemodialysis date and time:    Restricted appendage: right arm   R Recommendations: See Admitting Provider Note  Report given to:   Additional Notes:

## 2023-04-01 NOTE — H&P (Signed)
History and Physical    Patient: Peter [name] ZOX:[id number] DOB: 06/27/1951 DOA: 04/01/2023 DOS: the patient was seen and examined on 04/01/2023 PCP: [name], MD  Patient coming from: Home  Chief Complaint:  Chief Complaint  Patient presents with   Fever   Emesis   HPI: Peter [name] is a 72 y.o. male with medical history significant of ESRD on HD MWF, Type 2 DM , diet controlled,  anemia of CKD, hypertension, Hyperlipidemia, was brought in for generalized weakness. As per the patient and his friend at bedside, patient has been having nausea, vomiting since Sunday morning. He also reports intermittent fevers, and chills. He denies any chest pain or sob, abdominal pain.  He reports falling on Sunday evening ,denies any syncope. He said he was unable to get up and was on the floor for a few hours. He denies any urinary complaints.    ED work up:  Labs show sodium of 127, potassium of 5.6, bicarb of 14, creatinine of 12.58, AST 172, ALT 65, Lactic acid of 4, wbc count of 13.8, platelets of 85, INR of 1.6, RVP negative, influenza negative, COVID negative.  Blood culture done and pending.  CT head and cervical spine shows No evidence of an acute intracranial abnormality. No evidence of an acute cervical spine fracture.  Cervical spondylosis as outlined. Notably at C2-C3 and C3-C4, central disc protrusions contribute to at least moderate spinal canal stenosis.  CXR shows Medial right lower lung patchy opacities, which may represent atelectasis, aspiration, or pneumonia.  He was referred to [facility] for admission for possible aspiration pneumonia.    Review of Systems: As mentioned in the history of present illness. All other systems reviewed and are negative. Past Medical History:  Diagnosis Date   Anemia in chronic kidney disease (CKD)    CKD (chronic kidney disease) stage 5, GFR less than 15 ml/min (HCC)    M-W-F   Diabetes mellitus without complication (HCC) 03/2012   Hx  type II - no meds, diet controlled, lost weight   ED (erectile dysfunction)    Heart murmur    not significant   History of kidney stones    passed 1 , 1 was removed with kidney disease in 2010   Hypercholesteremia    Hypertension    Iron deficiency    Kidney stone 2010   MVP (mitral valve prolapse)    Posterior with MR by ECHO   OSA (obstructive sleep apnea)    does not use cpap, lost weight    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   Syncope    Past Surgical History:  Procedure Laterality Date   A/V FISTULAGRAM Left 02/10/2018   Procedure: A/V FISTULAGRAM;  Surgeon: Nada Libman, MD;  Location: [facility];  Service: Cardiovascular;  Laterality: Left;   APPLICATION OF WOUND VAC Right 03/15/2021   Procedure: APPLICATION OF WOUND VAC;  Surgeon: Leonie Douglas, MD;  Location: [facility] OR;  Service: Vascular;  Laterality: Right;   AV FISTULA PLACEMENT Left 06/14/2016   Procedure: ARTERIOVENOUS (AV) FISTULA CREATION LEFT ARM;  Surgeon: Chuck Hint, MD;  Location: [facility] OR;  Service: Vascular;  Laterality: Left;   AV FISTULA PLACEMENT Right 06/09/2018   Procedure: CREATION Right arm Brachiocephalic;  Surgeon: Maeola Harman, MD;  Location: [facility] OR;  Service: Vascular;  Laterality: Right;   AV FISTULA PLACEMENT Right 01/16/2023   Procedure: INSERTION OF RIGHT ARM ARTERIOVENOUS (AV) GORE-TEX GRAFT;  Surgeon: Leonie Douglas, MD;  Location: [facility] OR;  Service: Vascular;  Laterality: Right;   BASCILIC VEIN TRANSPOSITION Right 09/10/2018   Procedure: CEPHALIC VEIN TRANSPOSITION;  Surgeon: Maeola Harman, MD;  Location: [facility] OR;  Service: Vascular;  Laterality: Right;   BASCILIC VEIN TRANSPOSITION Left 01/25/2021   Procedure: LEFT ARMFIRST STAGE BASCILIC VEIN TRANSPOSITION;  Surgeon: Leonie Douglas, MD;  Location: [facility] OR;  Service: Vascular;  Laterality: Left;  PERIPHERAL NERVE BLOCK   BASCILIC VEIN TRANSPOSITION Left 03/15/2021   Procedure: LEFT  UPPER ARM SECOND STAGE BASILIC TRANSPOSITION;  Surgeon: Leonie Douglas, MD;  Location: [facility] OR;  Service: Vascular;  Laterality: Left;   COLONOSCOPY     INSERTION OF DIALYSIS CATHETER N/A 06/09/2018   Procedure: INSERTION OF DIALYSIS CATHETER;  Surgeon: Maeola Harman, MD;  Location: [facility] OR;  Service: Vascular;  Laterality: N/A;   JOINT REPLACEMENT Left 2010   hip   PARTIAL NEPHRECTOMY Left 2010   PERIPHERAL VASCULAR INTERVENTION Left 02/10/2018   Procedure: PERIPHERAL VASCULAR INTERVENTION;  Surgeon: Nada Libman, MD;  Location: [facility];  Service: Cardiovascular;  Laterality: Left;   TONSILLECTOMY     Social History:  reports that he quit smoking about 36 years ago. His smoking use included cigarettes. His smokeless tobacco use includes chew. He reports that he does not drink alcohol and does not use drugs.  Allergies  Allergen Reactions   Penicillins Other (See Comments)    UNSPECIFIED REACTION Childhood allergy    Doxazosin Mesylate     ineffective for BP (10/2014)   Triamterene-Hctz     increased creatinine (see 09/2014 office note)    Family History  Problem Relation Age of Onset   Dementia Mother    Hypertension Father   Reviewed and unremarkable.   Prior to Admission medications   Medication Sig Start Date End Date Taking? Authorizing Provider  apixaban (ELIQUIS) 5 MG TABS tablet Take 1 tablet (5 mg total) by mouth 2 (two) times daily. 02/26/23   Rollene Rotunda, MD  apixaban (ELIQUIS) 5 MG TABS tablet Take 1 tablet (5 mg total) by mouth 2 (two) times daily. 02/26/23   Rollene Rotunda, MD  atorvastatin (LIPITOR) 10 MG tablet Take 10 mg by mouth at bedtime.    [provider]  AURYXIA 1 GM 210 MG(Fe) tablet Take 420-630 mg by mouth See admin instructions. Take 630mg  by mouth three times daily with meals and take 420mg  by mouth with snacks.    [provider]  cinacalcet (SENSIPAR) 90 MG tablet Take 180 mg by mouth daily.    [provider]  traMADol (ULTRAM) 50 MG tablet Take 1 tablet (50 mg total) by mouth every 6 (six) hours as needed. Patient not taking: Reported on 03/20/2023 01/16/23 01/16/24  Ernestene Mention, PA-C    Physical Exam: Vitals:   04/01/23 0915 04/01/23 1042 04/01/23 1100 04/01/23 1344  BP:   (!) 102/42 127/72  Pulse: (!) 40  74   Resp: (!) 41  (!) 32   Temp:  97.9 F (36.6 C)  97.9 F (36.6 C)  TempSrc:  Oral  Oral  SpO2: 97%  96%   Weight:      Height:       General exam: Appears calm and comfortable  Respiratory system: Clear to auscultation. Respiratory effort normal. Cardiovascular system: S1 & S2 heard, RRR. No JVD,  Gastrointestinal system: Abdomen is nondistended, soft and nontender.  Central nervous system: Alert and oriented. No  focal neurological deficits. Extremities: Symmetric 5 x 5 power. Skin: No rashes,  Psychiatry: Mood & affect appropriate.   Data Reviewed: Results for orders placed or performed during the hospital encounter of 04/01/23 (from the past 24 hour(s))  Resp panel by RT-PCR (RSV, Flu A&B, Covid) Anterior Nasal Swab     Status: None   Collection Time: 04/01/23  8:23 AM   Specimen: Anterior Nasal Swab  Result Value Ref Range   SARS Coronavirus 2 by RT PCR NEGATIVE NEGATIVE   Influenza A by PCR NEGATIVE NEGATIVE   Influenza B by PCR NEGATIVE NEGATIVE   Resp Syncytial Virus by PCR NEGATIVE NEGATIVE  Lactic acid, plasma     Status: Abnormal   Collection Time: 04/01/23  8:51 AM  Result Value Ref Range   Lactic Acid, Venous 4.6 (HH) 0.5 - 1.9 mmol/L  Comprehensive metabolic panel     Status: Abnormal   Collection Time: 04/01/23  8:51 AM  Result Value Ref Range   Sodium 129 (L) 135 - 145 mmol/L   Potassium 5.3 (H) 3.5 - 5.1 mmol/L   Chloride 91 (L) 98 - 111 mmol/L   CO2 18 (L) 22 - 32 mmol/L   Glucose, Bld 136 (H) 70 - 99 mg/dL   BUN 161 (H) 8 - 23 mg/dL   Creatinine, Ser 09.60 (H) 0.61 - 1.24 mg/dL   Calcium 9.8 8.9 - 45.4 mg/dL   Total Protein 7.4  6.5 - 8.1 g/dL   Albumin 2.8 (L) 3.5 - 5.0 g/dL   AST 098 (H) 15 - 41 U/L   ALT 56 (H) 0 - 44 U/L   Alkaline Phosphatase 47 38 - 126 U/L   Total Bilirubin 1.1 0.3 - 1.2 mg/dL   GFR, Estimated 3 (L) >60 mL/min   Anion gap 20 (H) 5 - 15  CBC with Differential     Status: Abnormal   Collection Time: 04/01/23  8:51 AM  Result Value Ref Range   WBC 13.8 (H) 4.0 - 10.5 K/uL   RBC 4.11 (L) 4.22 - 5.81 MIL/uL   Hemoglobin 13.8 13.0 - 17.0 g/dL   HCT 11.9 14.7 - 82.9 %   MCV 98.8 80.0 - 100.0 fL   MCH 33.6 26.0 - 34.0 pg   MCHC 34.0 30.0 - 36.0 g/dL   RDW 56.2 13.0 - 86.5 %   Platelets 85 (L) 150 - 400 K/uL   nRBC 0.0 0.0 - 0.2 %   Neutrophils Relative % 88 %   Neutro Abs 12.1 (H) 1.7 - 7.7 K/uL   Lymphocytes Relative 4 %   Lymphs Abs 0.6 (L) 0.7 - 4.0 K/uL   Monocytes Relative 6 %   Monocytes Absolute 0.9 0.1 - 1.0 K/uL   Eosinophils Relative 0 %   Eosinophils Absolute 0.0 0.0 - 0.5 K/uL   Basophils Relative 0 %   Basophils Absolute 0.0 0.0 - 0.1 K/uL   WBC Morphology Mild Left Shift (1-5% metas, occ myelo)    RBC Morphology MORPHOLOGY UNREMARKABLE    Smear Review MORPHOLOGY UNREMARKABLE    Immature Granulocytes 2 %   Abs Immature Granulocytes 0.21 (H) 0.00 - 0.07 K/uL  Protime-INR     Status: Abnormal   Collection Time: 04/01/23  8:51 AM  Result Value Ref Range   Prothrombin Time 19.0 (H) 11.4 - 15.2 seconds   INR 1.6 (H) 0.8 - 1.2  APTT     Status: None   Collection Time: 04/01/23  8:51 AM  Result Value Ref Range  aPTT 33 24 - 36 seconds  Comprehensive metabolic panel     Status: Abnormal   Collection Time: 04/01/23  2:47 PM  Result Value Ref Range   Sodium 127 (L) 135 - 145 mmol/L   Potassium 5.6 (H) 3.5 - 5.1 mmol/L   Chloride 93 (L) 98 - 111 mmol/L   CO2 14 (L) 22 - 32 mmol/L   Glucose, Bld 125 (H) 70 - 99 mg/dL   BUN 161 (H) 8 - 23 mg/dL   Creatinine, Ser 09.60 (H) 0.61 - 1.24 mg/dL   Calcium 9.1 8.9 - 45.4 mg/dL   Total Protein 6.6 6.5 - 8.1 g/dL   Albumin 2.5  (L) 3.5 - 5.0 g/dL   AST 098 (H) 15 - 41 U/L   ALT 65 (H) 0 - 44 U/L   Alkaline Phosphatase 39 38 - 126 U/L   Total Bilirubin 1.0 0.3 - 1.2 mg/dL   GFR, Estimated 3 (L) >60 mL/min   Anion gap 20 (H) 5 - 15  Hepatitis B surface antigen     Status: None   Collection Time: 04/01/23  2:47 PM  Result Value Ref Range   Hepatitis B Surface Ag NON REACTIVE NON REACTIVE  Lactic acid, plasma     Status: Abnormal   Collection Time: 04/01/23  2:48 PM  Result Value Ref Range   Lactic Acid, Venous 4.0 (HH) 0.5 - 1.9 mmol/L     Assessment and Plan:  Nausea and vomiting:  Unclear etiology, appears to have resolved. ? Viral gastroenteritis.  Symptomatic management with zofran if needed.    Possible aspiration pneumonia/ right lower lung opacities: Started on IV rocephin and azithromycin. He remains on RA with good sats.    ESRD on HD with right arm AV graft  Missed HD yesterday,  Nephrology consulted, plan for HD later today.     Fall without syncope:  Left shoulder pain. X rays unrevealing, will get MRI of the shoulder to evaluate for rotater cuff injury.  Therapy evaluations ordered.  Check orthostatic vital signs in am.    Acute metabolic encephalopathy:  Suspect from missing HD, electrolyte derangements and dehydration.  He was given a liter of fluids in ED.  HD later today.    Lactic acidosis:  ? From poor oral intake, vomiting and dehydration.  Repeat later today.    GERD  Resume PPI.   PAF Rate controlled .  He is on Eliquis 5 mg BID, continue the same.    H/o MVP and mild MR;  Monitor.   Hyperkalemia:  Will give lokelma.  Repeat K in am.    Hyponatremia  Possible from missing HD   Leukocytosis:  ? From aspiration pneumonia vs reactive.  Recheck cbc with diff in am.   Thrombocytopenia:  ? From acute illness.  Recheck and adjust meds as needed.   Right knee pain  X rays of the right knee:  Tri compartment degenerative changes seen.  Moderate joint  effusion of uncertain etiology.  Continue to monitor, if pain doesn't improve, will need to get orthopedics for aspiration.    Advance Care Planning:   Code Status: Full Code   Consults: palliative care consult Nephrology.   Family Communication: discussed with family friend at bedside.   Severity of Illness: The appropriate patient status for this patient is INPATIENT. Inpatient status is judged to be reasonable and necessary in order to provide the required intensity of service to ensure the patient's safety. The patient's presenting symptoms, physical exam  findings, and initial radiographic and laboratory data in the context of their chronic comorbidities is felt to place them at high risk for further clinical deterioration. Furthermore, it is not anticipated that the patient will be medically stable for discharge from the hospital within 2 midnights of admission.   * I certify that at the point of admission it is my clinical judgment that the patient will require inpatient hospital care spanning beyond 2 midnights from the point of admission due to high intensity of service, high risk for further deterioration and high frequency of surveillance required.*  Author: Kathlen Mody, MD 04/01/2023 1:46 PM  For on call review [email or web address].

## 2023-04-01 NOTE — ED Triage Notes (Signed)
Per EMS, Pt, from home, c/o fever, n/v, and weakness x2 days.  Denies pain.  Pt reports missing dialysis yesterday.  Sts he has not vomited since Sunday, but has not been eating.

## 2023-04-02 ENCOUNTER — Inpatient Hospital Stay (HOSPITAL_COMMUNITY): Payer: Medicare Other

## 2023-04-02 ENCOUNTER — Other Ambulatory Visit (HOSPITAL_COMMUNITY): Payer: Medicare Other

## 2023-04-02 DIAGNOSIS — M7989 Other specified soft tissue disorders: Secondary | ICD-10-CM | POA: Diagnosis not present

## 2023-04-02 DIAGNOSIS — N186 End stage renal disease: Secondary | ICD-10-CM | POA: Diagnosis not present

## 2023-04-02 DIAGNOSIS — L988 Other specified disorders of the skin and subcutaneous tissue: Secondary | ICD-10-CM | POA: Diagnosis not present

## 2023-04-02 DIAGNOSIS — Z515 Encounter for palliative care: Secondary | ICD-10-CM | POA: Diagnosis not present

## 2023-04-02 DIAGNOSIS — Z7189 Other specified counseling: Secondary | ICD-10-CM | POA: Diagnosis not present

## 2023-04-02 DIAGNOSIS — R7881 Bacteremia: Secondary | ICD-10-CM | POA: Diagnosis not present

## 2023-04-02 DIAGNOSIS — D631 Anemia in chronic kidney disease: Secondary | ICD-10-CM

## 2023-04-02 DIAGNOSIS — J690 Pneumonitis due to inhalation of food and vomit: Secondary | ICD-10-CM | POA: Diagnosis not present

## 2023-04-02 DIAGNOSIS — B9561 Methicillin susceptible Staphylococcus aureus infection as the cause of diseases classified elsewhere: Secondary | ICD-10-CM | POA: Diagnosis not present

## 2023-04-02 LAB — RENAL FUNCTION PANEL
Albumin: 2.3 g/dL — ABNORMAL LOW (ref 3.5–5.0)
Anion gap: 20 — ABNORMAL HIGH (ref 5–15)
BUN: 58 mg/dL — ABNORMAL HIGH (ref 8–23)
CO2: 21 mmol/L — ABNORMAL LOW (ref 22–32)
Calcium: 9.1 mg/dL (ref 8.9–10.3)
Chloride: 90 mmol/L — ABNORMAL LOW (ref 98–111)
Creatinine, Ser: 7.96 mg/dL — ABNORMAL HIGH (ref 0.61–1.24)
GFR, Estimated: 7 mL/min — ABNORMAL LOW (ref >60)
Glucose, Bld: 117 mg/dL — ABNORMAL HIGH (ref 70–99)
Phosphorus: 4.7 mg/dL — ABNORMAL HIGH (ref 2.5–4.6)
Potassium: 5.2 mmol/L — ABNORMAL HIGH (ref 3.5–5.1)
Sodium: 131 mmol/L — ABNORMAL LOW (ref 135–145)

## 2023-04-02 LAB — CBC WITH DIFFERENTIAL/PLATELET
Abs Immature Granulocytes: 0.16 10*3/uL — ABNORMAL HIGH (ref 0.00–0.07)
Basophils Absolute: 0.1 10*3/uL (ref 0.0–0.1)
Basophils Relative: 1 %
Eosinophils Absolute: 0.1 10*3/uL (ref 0.0–0.5)
Eosinophils Relative: 1 %
HCT: 37 % — ABNORMAL LOW (ref 39.0–52.0)
Hemoglobin: 12.9 g/dL — ABNORMAL LOW (ref 13.0–17.0)
Immature Granulocytes: 2 %
Lymphocytes Relative: 5 %
Lymphs Abs: 0.5 10*3/uL — ABNORMAL LOW (ref 0.7–4.0)
MCH: 33.9 pg (ref 26.0–34.0)
MCHC: 34.9 g/dL (ref 30.0–36.0)
MCV: 97.4 fL (ref 80.0–100.0)
Monocytes Absolute: 0.8 10*3/uL (ref 0.1–1.0)
Monocytes Relative: 9 %
Neutro Abs: 7.5 10*3/uL (ref 1.7–7.7)
Neutrophils Relative %: 82 %
Platelets: 77 10*3/uL — ABNORMAL LOW (ref 150–400)
RBC: 3.8 MIL/uL — ABNORMAL LOW (ref 4.22–5.81)
RDW: 14.4 % (ref 11.5–15.5)
WBC: 9 10*3/uL (ref 4.0–10.5)
nRBC: 0 % (ref 0.0–0.2)

## 2023-04-02 LAB — BODY FLUID CULTURE W GRAM STAIN

## 2023-04-02 LAB — HEPATITIS PANEL, ACUTE
HCV Ab: NONREACTIVE
Hep A IgM: NONREACTIVE
Hep B C IgM: NONREACTIVE
Hepatitis B Surface Ag: NONREACTIVE

## 2023-04-02 LAB — TYPE AND SCREEN
ABO/RH(D): O POS
Antibody Screen: NEGATIVE

## 2023-04-02 LAB — HEPATITIS B SURFACE ANTIBODY, QUANTITATIVE: Hep B S AB Quant (Post): 10.3 m[IU]/mL

## 2023-04-02 LAB — CULTURE, BLOOD (ROUTINE X 2): Culture: NO GROWTH

## 2023-04-02 LAB — PROCALCITONIN: Procalcitonin: 37.09 ng/mL

## 2023-04-02 LAB — LACTIC ACID, PLASMA: Lactic Acid, Venous: 3.9 mmol/L (ref 0.5–1.9)

## 2023-04-02 LAB — C-REACTIVE PROTEIN: CRP: 43 mg/dL — ABNORMAL HIGH (ref <1.0)

## 2023-04-02 LAB — MRSA NEXT GEN BY PCR, NASAL: MRSA by PCR Next Gen: NOT DETECTED

## 2023-04-02 LAB — AMMONIA: Ammonia: 28 umol/L (ref 9–35)

## 2023-04-02 MED ORDER — DILTIAZEM HCL 60 MG PO TABS
60.0000 mg | ORAL_TABLET | Freq: Two times a day (BID) | ORAL | Status: DC
  Administered 2023-04-02 – 2023-04-03 (×2): 60 mg via ORAL
  Filled 2023-04-02 (×2): qty 1

## 2023-04-02 MED ORDER — PANTOPRAZOLE SODIUM 40 MG IV SOLR
40.0000 mg | Freq: Two times a day (BID) | INTRAVENOUS | Status: DC
  Administered 2023-04-02 – 2023-04-18 (×32): 40 mg via INTRAVENOUS
  Filled 2023-04-02 (×32): qty 10

## 2023-04-02 MED ORDER — ORAL CARE MOUTH RINSE: 15.0000 mL | OROMUCOSAL | Status: DC | PRN

## 2023-04-02 MED ORDER — BUPIVACAINE HCL (PF) 0.5 % IJ SOLN
10.0000 mL | Freq: Once | INTRAMUSCULAR | Status: DC
  Filled 2023-04-02 (×2): qty 10

## 2023-04-02 MED ORDER — SODIUM CHLORIDE 0.9 % IV SOLN
3.0000 g | Freq: Every day | INTRAVENOUS | Status: DC
  Administered 2023-04-02 – 2023-04-03 (×2): 3 g via INTRAVENOUS
  Filled 2023-04-02 (×2): qty 8

## 2023-04-02 MED ORDER — CHLORHEXIDINE GLUCONATE CLOTH 2 % EX PADS
6.0000 | MEDICATED_PAD | Freq: Every day | CUTANEOUS | Status: DC
  Administered 2023-04-03 – 2023-04-13 (×7): 6 via TOPICAL

## 2023-04-02 MED ORDER — HEPARIN (PORCINE) 25000 UT/250ML-% IV SOLN
1250.0000 [IU]/h | INTRAVENOUS | Status: DC
  Administered 2023-04-02 – 2023-04-04 (×3): 1200 [IU]/h via INTRAVENOUS
  Administered 2023-04-06: 1350 [IU]/h via INTRAVENOUS
  Administered 2023-04-07: 1250 [IU]/h via INTRAVENOUS
  Filled 2023-04-02 (×4): qty 250

## 2023-04-02 MED ORDER — DILTIAZEM HCL 25 MG/5ML IV SOLN: 10.0000 mg | Freq: Four times a day (QID) | INTRAVENOUS | Status: DC | PRN

## 2023-04-02 MED ORDER — IOHEXOL 350 MG/ML SOLN
75.0000 mL | Freq: Once | INTRAVENOUS | Status: AC | PRN
  Administered 2023-04-02: 75 mL via INTRAVENOUS

## 2023-04-02 MED ORDER — RENA-VITE PO TABS
1.0000 | ORAL_TABLET | Freq: Every day | ORAL | Status: DC
  Administered 2023-04-02 – 2023-04-17 (×16): 1 via ORAL
  Filled 2023-04-02 (×17): qty 1

## 2023-04-02 MED ORDER — LACTATED RINGERS IV BOLUS: 500.0000 mL | Freq: Once | INTRAVENOUS | Status: DC

## 2023-04-02 NOTE — Progress Notes (Addendum)
ANTICOAGULATION CONSULT NOTE - Initial Consult  Pharmacy Consult for transition from apixaban to heparin Indication: atrial fibrillation  Allergies  Allergen Reactions   Penicillins Other (See Comments)    UNSPECIFIED REACTION Childhood allergy    Doxazosin Mesylate     ineffective for BP (10/2014)   Triamterene-Hctz     increased creatinine (see 09/2014 office note)    Patient Measurements: Height: 5\' 6"  (167.6 cm) Weight: 97.6 kg (215 lb 2.7 oz) IBW/kg (Calculated) : 63.8 Heparin Dosing Weight: 85.6 kg  Vital Signs: Temp: 100.4 F (38 C) (07/03 0418) Temp Source: Oral (07/03 0418) BP: 103/80 (07/03 0418) Pulse Rate: 88 (07/03 0418)  Labs: Recent Labs    04/01/23 0851 04/01/23 1447 04/02/23 0340 04/02/23 0555  HGB 13.8  --  12.9*  --   HCT 40.6  --  37.0*  --   PLT 85*  --  77*  --   APTT 33  --   --   --   LABPROT 19.0*  --   --   --   INR 1.6*  --   --   --   CREATININE 13.88* 13.58*  --  7.96*    Estimated Creatinine Clearance: 8.9 mL/min (A) (by C-G formula based on SCr of 7.96 mg/dL (H)).   Medical History: Past Medical History:  Diagnosis Date   Anemia in chronic kidney disease (CKD)    CKD (chronic kidney disease) stage 5, GFR less than 15 ml/min (HCC)    M-W-F   Diabetes mellitus without complication (HCC) 03/2012   Hx type II - no meds, diet controlled, lost weight   ED (erectile dysfunction)    Heart murmur    not significant   History of kidney stones    passed 1 , 1 was removed with kidney disease in 2010   Hypercholesteremia    Hypertension    Iron deficiency    Kidney stone 2010   MVP (mitral valve prolapse)    Posterior with MR by ECHO   OSA (obstructive sleep apnea)    does not use cpap, lost weight    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   Syncope     Medications:  Medications Prior to Admission  Medication Sig Dispense Refill Last Dose   apixaban (ELIQUIS) 5 MG TABS tablet Take 1 tablet (5 mg total)  by mouth 2 (two) times daily. 60 tablet 11    apixaban (ELIQUIS) 5 MG TABS tablet Take 1 tablet (5 mg total) by mouth 2 (two) times daily. 28 tablet 0    atorvastatin (LIPITOR) 10 MG tablet Take 10 mg by mouth at bedtime.      AURYXIA 1 GM 210 MG(Fe) tablet Take 420-630 mg by mouth See admin instructions. Take 630mg  by mouth three times daily with meals and take 420mg  by mouth with snacks.      cinacalcet (SENSIPAR) 90 MG tablet Take 180 mg by mouth daily.      traMADol (ULTRAM) 50 MG tablet Take 1 tablet (50 mg total) by mouth every 6 (six) hours as needed. (Patient not taking: Reported on 03/20/2023) 15 tablet 0    Scheduled:   (feeding supplement) PROSource Plus  30 mL Oral BID BM   atorvastatin  10 mg Oral QHS   Chlorhexidine Gluconate Cloth  6 each Topical Daily   cinacalcet  180 mg Oral Q breakfast   ferric citrate  630 mg Oral TID WC   pantoprazole (PROTONIX) IV  40  mg Intravenous Q12H    Assessment: 72 YOM w/ prior history of AF requiring heparin gtt. Last dose of apixaban given ~10 AM on 04/02/2023  Goal of Therapy:  Heparin level 0.3-0.7 units/ml aPTT 66-102 seconds Monitor platelets by anticoagulation protocol: Yes   Plan:  Start heparin infusion at 1200 units/hr at 22:00 on 04/02/2023 Check heparin level and aPTT in 8 hours and daily while on heparin May stop aPTT when HL correlates Continue to monitor H&H and platelets  Jaleyah Longhi BS, PharmD, BCPS Clinical Pharmacist 04/02/2023 11:49 AM  Contact: [phone] after 3 PM  "Be curious, not judgmental..." -Debbora Dus

## 2023-04-02 NOTE — Progress Notes (Signed)
[facility] NEPHROLOGY PROGRESS NOTE  Assessment/ Plan: Pt is a 72 y.o. yo male   with ESRD, T2DM, HTN, OSA, A-fib (on Eliquis), HL who was admitted with pneumonia.   Dialysis Orders:  MWF at [facility] 4hr, 450/800, EDW 97.5kg, 3K/2Ca, AVG, 15g, heparin 10,000 bolus - no ESA, Hgb > 12 - No VDRA (recently stopped d/t Ca 10.9)  # Aspiration pneumonia, sepsis: Currently on antibiotics, requiring oxygenation, echo.  Per primary team.  # ESRD MWF schedule.  He received dialysis yesterday office schedule.  We will plan for 3.5 hours of regular HD today.  UF as tolerated.  # Anemia of CKD: Hemoglobin above goal.  No need for ESA.  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  Continue Sensipar and Auryxia.  Monitor lab.  # HTN/volume: BP okay.  Off of antihypertensives.  Discussed with the patient, primary team and patient's significant other.  Subjective: Seen and examined.  Reports weakness and falling back to sleep easily.  He however denies nausea, vomiting, chest pain.  Breathing is much better.  His significant other was presented as well. Objective Vital signs in last 24 hours: Vitals:   04/01/23 2031 04/02/23 0000 04/02/23 0021 04/02/23 0418  BP: (!) 135/41 (!) 112/52  103/80  Pulse: 97 60 (!) 53 88  Resp: (!) 25 (!) 27 20 20   Temp:  97.6 F (36.4 C)  (!) 100.4 F (38 C)  TempSrc:  Oral  Oral  SpO2: 96% 92% 92% 91%  Weight: 97.6 kg     Height:       Weight change:   Intake/Output Summary (Last 24 hours) at 04/02/2023 1202 Last data filed at 04/02/2023 0400 Gross per 24 hour  Intake 50 ml  Output 1500 ml  Net -1450 ml       Labs: RENAL PANEL Recent Labs  Lab 04/01/23 0851 04/01/23 1447 04/02/23 0555  NA 129* 127* 131*  K 5.3* 5.6* 5.2*  CL 91* 93* 90*  CO2 18* 14* 21*  GLUCOSE 136* 125* 117*  BUN 102* 104* 58*  CREATININE 13.88* 13.58* 7.96*  CALCIUM 9.8 9.1 9.1  PHOS  --   --  4.7*  ALBUMIN 2.8* 2.5* 2.3*    Liver Function  Tests: Recent Labs  Lab 04/01/23 0851 04/01/23 1447 04/02/23 0555  AST 147* 172*  --   ALT 56* 65*  --   ALKPHOS 47 39  --   BILITOT 1.1 1.0  --   PROT 7.4 6.6  --   ALBUMIN 2.8* 2.5* 2.3*   No results for input(s): "LIPASE", "AMYLASE" in the last 168 hours. Recent Labs  Lab 04/02/23 0340  AMMONIA 28   CBC: Recent Labs    01/16/23 0820 04/01/23 0851 04/02/23 0340  HGB 12.6* 13.8 12.9*  MCV  --  98.8 97.4    Cardiac Enzymes: No results for input(s): "CKTOTAL", "CKMB", "CKMBINDEX", "TROPONINI" in the last 168 hours. CBG: No results for input(s): "GLUCAP" in the last 168 hours.  Iron Studies: No results for input(s): "IRON", "TIBC", "TRANSFERRIN", "FERRITIN" in the last 72 hours. Studies/Results: VAS [place] LOWER EXTREMITY VENOUS (DVT)  Result Date: 04/02/2023  Lower Venous DVT Study Patient Name:  Peter [name]  Date of Exam:   04/02/2023 Medical Rec #: [id number]          Accession #:    [id number] Date of Birth: Jan 01, 1951          Patient Gender: M Patient Age:   17 years  Exam Location:  [facility] Procedure:      VAS [place] LOWER EXTREMITY VENOUS (DVT) Referring Phys: Kathlen Mody --------------------------------------------------------------------------------  Indications: Swelling.  Limitations: Body habitus and poor ultrasound/tissue interface. Comparison Study: Previous 01/09/18 negative. Performing Technologist: McKayla Maag RVT, [place]  Examination Guidelines: A complete evaluation includes B-mode imaging, spectral Doppler, color Doppler, and power Doppler as needed of all accessible portions of each vessel. Bilateral testing is considered an integral part of a complete examination. Limited examinations for reoccurring indications may be performed as noted. The reflux portion of the exam is performed with the patient in reverse Trendelenburg.  +-----+---------------+---------+-----------+----------+--------------+  RIGHTCompressibilityPhasicitySpontaneityPropertiesThrombus Aging +-----+---------------+---------+-----------+----------+--------------+ CFV  Full           Yes      Yes                                 +-----+---------------+---------+-----------+----------+--------------+ SFJ  Full                                                        +-----+---------------+---------+-----------+----------+--------------+   +---------+---------------+---------+-----------+----------+-------------------+ LEFT     CompressibilityPhasicitySpontaneityPropertiesThrombus Aging      +---------+---------------+---------+-----------+----------+-------------------+ CFV      Full           Yes      Yes                                      +---------+---------------+---------+-----------+----------+-------------------+ SFJ      Full                                                             +---------+---------------+---------+-----------+----------+-------------------+ FV Prox  Full                                                             +---------+---------------+---------+-----------+----------+-------------------+ FV Mid   Full                                                             +---------+---------------+---------+-----------+----------+-------------------+ FV DistalFull                                                             +---------+---------------+---------+-----------+----------+-------------------+ PFV      Full                                                             +---------+---------------+---------+-----------+----------+-------------------+  POP      Full           Yes      Yes                                      +---------+---------------+---------+-----------+----------+-------------------+ PTV      Full                                                              +---------+---------------+---------+-----------+----------+-------------------+ PERO                                                  Not well visualized +---------+---------------+---------+-----------+----------+-------------------+    Summary: RIGHT: - No evidence of common femoral vein obstruction.  LEFT: - There is no evidence of deep vein thrombosis in the lower extremity. However, portions of this examination were limited- see technologist comments above.  - No cystic structure found in the popliteal fossa.  *See table(s) above for measurements and observations.    Preliminary    DG Abd Portable 1V  Result Date: 04/02/2023 CLINICAL DATA:  Nausea and vomiting EXAM: PORTABLE ABDOMEN - 1 VIEW COMPARISON:  Abdominal CT 04/05/2009 FINDINGS: Normal bowel gas pattern. Stool at the rectum without generalized abnormal stool retention. No concerning mass effect or calcification. Cardiomegaly with clear lung bases. Generalized spondylosis. Unremarkable left hip replacement. IMPRESSION: Normal bowel gas pattern. Electronically Signed   By: Tiburcio Pea M.D.   On: 04/02/2023 06:59   DG Chest Port 1 View  Result Date: 04/02/2023 CLINICAL DATA:  Shortness of breath EXAM: PORTABLE CHEST 1 VIEW COMPARISON:  04/01/2023 FINDINGS: Chronic cardiomegaly. Stable mediastinal contours. Low volume but clear lungs with no effusion or pneumothorax. Artifact from EKG leads. IMPRESSION: Stable low volume chest with cardiomegaly. Electronically Signed   By: Tiburcio Pea M.D.   On: 04/02/2023 06:58   DG Shoulder Left  Result Date: 04/01/2023 CLINICAL DATA:  Pain after fall EXAM: LEFT SHOULDER - 2 VIEW COMPARISON:  None Available. FINDINGS: Osteopenia. Osteophyte formation of the glenohumeral joint and AC joint with some joint space loss. There is also elevation of the humeral head relative to the acromion. Please correlate for a full-thickness rotator cuff tear. No acute fracture or dislocation. Surgical clips in the  left axillary region. IMPRESSION: Moderate degenerative changes. High-riding humeral head. Please correlate for a full-thickness rotator cuff tear Electronically Signed   By: Karen Kays M.D.   On: 04/01/2023 11:04   DG Knee Complete 4 Views Right  Result Date: 04/01/2023 CLINICAL DATA:  Pain after fall EXAM: RIGHT KNEE - COMPLETE 4 VIEW COMPARISON:  None Available. FINDINGS: Moderate joint space loss of the medial compartment. Small osteophytes seen of all 3 compartments greatest of the patellofemoral joint. There is a moderate joint effusion. Hyperostosis. Osteopenia. No fracture or dislocation. IMPRESSION: Tricompartment degenerative changes identified, overall moderate greatest involving the medial compartment and patellofemoral joint. Moderate joint effusion of uncertain etiology. This has a broad differential. This could be related to the degenerative changes. However please correlate for history of trauma or infection. Electronically Signed  By: Karen Kays M.D.   On: 04/01/2023 11:03   DG Knee Complete 4 Views Left  Result Date: 04/01/2023 CLINICAL DATA:  Pain after fall.  Weakness for 2 days EXAM: LEFT KNEE - COMPLETE 4 VIEW COMPARISON:  None Available. FINDINGS: Osteopenia. Slight joint space loss of the medial compartment. Small osteophytes of all 3 compartments. No joint effusion on lateral view. No fracture or dislocation. Hyperostosis of the patella. Vascular calcifications. IMPRESSION: Mild degenerative changes.  No joint effusion. Electronically Signed   By: Karen Kays M.D.   On: 04/01/2023 11:00   CT Head Wo Contrast  Result Date: 04/01/2023 CLINICAL DATA:  Provided history: Polytrauma, blunt. EXAM: CT HEAD WITHOUT CONTRAST CT CERVICAL SPINE WITHOUT CONTRAST TECHNIQUE: Multidetector CT imaging of the head and cervical spine was performed following the standard protocol without intravenous contrast. Multiplanar CT image reconstructions of the cervical spine were also generated.  RADIATION DOSE REDUCTION: This exam was performed according to the departmental dose-optimization program which includes automated exposure control, adjustment of the mA and/or kV according to patient size and/or use of iterative reconstruction technique. COMPARISON:  None. FINDINGS: CT HEAD FINDINGS Brain: Mild generalized parenchymal atrophy. There is no acute intracranial hemorrhage. No demarcated cortical infarct. No extra-axial fluid collection. No evidence of an intracranial mass. No midline shift. Vascular: No hyperdense vessel.  Atherosclerotic calcifications. Skull: No calvarial fracture or aggressive osseous lesion. Sinuses/Orbits: No mass or acute finding within the imaged orbits. Mild-to-moderate mucosal thickening, and small volume frothy secretions, within the right maxillary sinus. Small mucous retention cyst within the left maxillary sinus. CT CERVICAL SPINE FINDINGS Alignment: No significant spondylolisthesis. Skull base and vertebrae: The basion-dental and atlanto-dental intervals are maintained.No evidence of acute fracture to the cervical spine. Soft tissues and spinal canal: No prevertebral fluid or swelling. No visible canal hematoma. Disc levels: Cervical spondylosis with multilevel disc space narrowing, disc bulges/central disc protrusions, endplate spurring, uncovertebral hypertrophy and facet arthrosis. Disc space narrowing is greatest at C5-C6 (moderate/advanced), C7-T1 (moderate/advanced), T1-T2 (advanced) and T2-T3 (advanced). 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 Most notably at C2-C3 and C3-C4, central disc protrusions contribute to at least moderate spinal canal stenosis. Multilevel bony neural foraminal narrowing. Upper chest: No consolidation within the imaged lung apices. No visible pneumothorax. IMPRESSION: CT head: 1.  No evidence of an acute intracranial abnormality. 2. Mild generalized parenchymal atrophy. 3.  Paranasal sinus disease as described. CT cervical spine: 1. No evidence of an acute cervical spine fracture. 2. Cervical spondylosis as outlined. Notably at C2-C3 and C3-C4, central disc protrusions contribute to at least moderate spinal canal stenosis. Multilevel bony neural from narrowing. Electronically Signed   By: Jackey Loge D.O.   On: 04/01/2023 10:32   CT Cervical Spine Wo Contrast  Result Date: 04/01/2023 CLINICAL DATA:  Provided history: Polytrauma, blunt. EXAM: CT HEAD WITHOUT CONTRAST CT CERVICAL SPINE WITHOUT CONTRAST TECHNIQUE: Multidetector CT imaging of the head and cervical spine was performed following the standard protocol without intravenous contrast. Multiplanar CT image reconstructions of the cervical spine were also generated. RADIATION DOSE REDUCTION: This exam was performed according to the departmental dose-optimization program which includes automated exposure control, adjustment of the mA and/or kV according to patient size and/or use of iterative reconstruction technique. COMPARISON:  None. FINDINGS: CT HEAD FINDINGS Brain: Mild generalized parenchymal atrophy. There is no acute intracranial hemorrhage. No demarcated cortical infarct. No extra-axial fluid collection. No evidence of an intracranial mass. No midline shift. Vascular: No  hyperdense vessel.  Atherosclerotic calcifications. Skull: No calvarial fracture or aggressive osseous lesion. Sinuses/Orbits: No mass or acute finding within the imaged orbits. Mild-to-moderate mucosal thickening, and small volume frothy secretions, within the right maxillary sinus. Small mucous retention cyst within the left maxillary sinus. CT CERVICAL SPINE FINDINGS Alignment: No significant spondylolisthesis. Skull base and vertebrae: The basion-dental and atlanto-dental intervals are maintained.No evidence of acute fracture to the cervical spine. Soft tissues and spinal canal: No prevertebral fluid or swelling. No visible canal hematoma. Disc  levels: Cervical spondylosis with multilevel disc space narrowing, disc bulges/central disc protrusions, endplate spurring, uncovertebral hypertrophy and facet arthrosis. Disc space narrowing is greatest at C5-C6 (moderate/advanced), C7-T1 (moderate/advanced), T1-T2 (advanced) and T2-T3 (advanced). 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 Most notably at C2-C3 and C3-C4, central disc protrusions contribute to at least moderate spinal canal stenosis. Multilevel bony neural foraminal narrowing. Upper chest: No consolidation within the imaged lung apices. No visible pneumothorax. IMPRESSION: CT head: 1.  No evidence of an acute intracranial abnormality. 2. Mild generalized parenchymal atrophy. 3. Paranasal sinus disease as described. CT cervical spine: 1. No evidence of an acute cervical spine fracture. 2. Cervical spondylosis as outlined. Notably at C2-C3 and C3-C4, central disc protrusions contribute to at least moderate spinal canal stenosis. Multilevel bony neural from narrowing. Electronically Signed   By: Jackey Loge D.O.   On: 04/01/2023 10:32   DG Chest Portable 1 View  Result Date: 04/01/2023 CLINICAL DATA:  Shortness of breath EXAM: PORTABLE CHEST 1 VIEW COMPARISON:  Chest radiograph dated 06/09/2018 FINDINGS: The medial apices are obscured due to overlying mandible. Low lung volumes with bronchovascular crowding. Medial right lower lung patchy opacities. No pleural effusion or pneumothorax. Similar enlarged cardiomediastinal silhouette. No acute osseous abnormality. IMPRESSION: 1. Medial right lower lung patchy opacities, which may represent atelectasis, aspiration, or pneumonia. 2. Similar cardiomegaly. Electronically Signed   By: Agustin Cree M.D.   On: 04/01/2023 08:50    Medications: Infusions:  ampicillin-sulbactam (UNASYN) IV     heparin      Scheduled Medications:  (feeding supplement) PROSource Plus  30 mL Oral BID BM   atorvastatin   10 mg Oral QHS   Chlorhexidine Gluconate Cloth  6 each Topical Daily   [START ON 04/03/2023] Chlorhexidine Gluconate Cloth  6 each Topical Q0600   cinacalcet  180 mg Oral Q breakfast   ferric citrate  630 mg Oral TID WC   pantoprazole (PROTONIX) IV  40 mg Intravenous Q12H    have reviewed scheduled and prn medications.  Physical Exam: General: Ill looking male.  Not in distress falling back to sleep easily. Heart:RRR, s1s2 nl Lungs: Basal rhonchi, no increased work of breathing. Abdomen:soft, Non-tender, non-distended Extremities:No edema Dialysis Access: AV graft has thrill and bruit.  Darcus Edds Jaynie Collins 04/02/2023,12:02 PM  LOS: 1 day

## 2023-04-02 NOTE — Progress Notes (Signed)
Left lower extremity venous study completed.   Preliminary results relayed to MD.  Please see CV Procedures for preliminary results.  Hashim Eichhorst, RVT  9:27 AM 04/02/23

## 2023-04-02 NOTE — Progress Notes (Signed)
Pharmacy Antibiotic Note  Peter [name] is a 72 y.o. male admitted on 04/01/2023 with pneumonia and IAI .  Pharmacy has been consulted for unasyn dosing.  Plan: DC cefazolin Start Unasyn 3 grams iv qday- give after HD on HD days  Height: 5\' 6"  (167.6 cm) Weight: 97.6 kg (215 lb 2.7 oz) IBW/kg (Calculated) : 63.8  Temp (24hrs), Avg:98.1 F (36.7 C), Min:97.3 F (36.3 C), Max:100.4 F (38 C)  Recent Labs  Lab 04/01/23 0851 04/01/23 1447 04/01/23 1448 04/01/23 2315 04/02/23 0340 04/02/23 0555  WBC 13.8*  --   --   --  9.0  --   CREATININE 13.88* 13.58*  --   --   --  7.96*  LATICACIDVEN 4.6*  --  4.0* 3.9*  --   --     Estimated Creatinine Clearance: 8.9 mL/min (A) (by C-G formula based on SCr of 7.96 mg/dL (H)).    Allergies  Allergen Reactions   Penicillins Other (See Comments)    UNSPECIFIED REACTION Childhood allergy    Doxazosin Mesylate     ineffective for BP (10/2014)   Triamterene-Hctz     increased creatinine (see 09/2014 office note)    Antimicrobials this admission: CTX 7/2 x1 dose  Azith 7/2 x1 dose Cefazolin 7/2 >> 7/3 Unasyn 7/3 >>  Dose adjustments this admission: Unasyn adjusted for HD  Microbiology results: 7/2 BCID (2/4) SA 7/2 Bcx- SA- susc. pending   Thank you for allowing pharmacy to be a part of this patient's care.  Greta Doom BS, PharmD, BCPS Clinical Pharmacist 04/02/2023 11:57 AM  Contact: [phone] after 3 PM  "Be curious, not judgmental..." -Debbora Dus

## 2023-04-02 NOTE — Consult Note (Signed)
[facility]       Reason for Consult: MSSA bacteremia     Referring Physician: CHAMP autoconsult  Principal Problem:   Aspiration pneumonia (HCC) Active Problems:   Obstructive sleep apnea   Essential hypertension, benign   MVP (mitral valve prolapse)   ESRD on dialysis (HCC)   Anemia in chronic kidney disease   PAF (paroxysmal atrial fibrillation) (HCC)    (feeding supplement) PROSource Plus  30 mL Oral BID BM   atorvastatin  10 mg Oral QHS   Chlorhexidine Gluconate Cloth  6 each Topical Daily   cinacalcet  180 mg Oral Q breakfast   ferric citrate  630 mg Oral TID WC   pantoprazole (PROTONIX) IV  40 mg Intravenous Q12H    Recommendations: Cefazolin TTE Repeat blood cultures Can add metronidazole or change to ampicillin/sulbactam if there is concern for aspiration pneumonia   Assessment: He came in with N/V fever, chills and now blood culture positive with Staph aureus.  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     HPI: Peter [name] is a 72 y.o. male with a history of ESRD on IHD, right arm graft in place, here with fever, generalized weakness and n/v.  He has felt poorly and admitted overnight with blood cultures now positive in 1/2 sets with Staph aureus, BCID with MSSA.  He was started on cefazolin and repeat blood cultures sent.  He is feeling better since admission.  Friend at bedside.  Initial WBC 13.8 and Tmax 102.6.  main complaint is not getting food last night or this am.     Review of Systems:  Constitutional: negative for fevers and chills All other systems reviewed and are negative    Past Medical History:  Diagnosis Date   Anemia in chronic kidney disease (CKD)    CKD (chronic kidney disease) stage 5, GFR less than 15 ml/min (HCC)    M-W-F   Diabetes mellitus without complication (HCC) 03/2012   Hx type II - no meds, diet controlled, lost weight   ED (erectile dysfunction)    Heart murmur    not  significant   History of kidney stones    passed 1 , 1 was removed with kidney disease in 2010   Hypercholesteremia    Hypertension    Iron deficiency    Kidney stone 2010   MVP (mitral valve prolapse)    Posterior with MR by ECHO   OSA (obstructive sleep apnea)    does not use cpap, lost weight    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   Syncope     Social History   Tobacco Use   Smoking status: Former    Types: Cigarettes    Quit date: 06/13/1986    Years since quitting: 36.8   Smokeless tobacco: Current    Types: Chew  Vaping Use   Vaping Use: Never used  Substance Use Topics   Alcohol use: No   Drug use: No    Family History  Problem Relation Age of Onset   Dementia Mother    Hypertension Father     Allergies  Allergen Reactions   Penicillins Other (See Comments)    UNSPECIFIED REACTION Childhood allergy    Doxazosin Mesylate     ineffective for BP (10/2014)   Triamterene-Hctz     increased creatinine (see 09/2014 office note)    Physical Exam: Constitutional: alert  Vitals:   04/02/23 0021 04/02/23  0418  BP:  103/80  Pulse: (!) 53 88  Resp: 20 20  Temp:  (!) 100.4 F (38 C)  SpO2: 92% 91%   EYES: anicteric Respiratory: some increase in his respiratory rate, speaking full sentences Musculoskeletal: no edema Skin: right arm fistula area without significant warmth  Lab Results  Component Value Date   WBC 9.0 04/02/2023   HGB 12.9 (L) 04/02/2023   HCT 37.0 (L) 04/02/2023   MCV 97.4 04/02/2023   PLT 77 (L) 04/02/2023    Lab Results  Component Value Date   CREATININE 7.96 (H) 04/02/2023   BUN 58 (H) 04/02/2023   NA 131 (L) 04/02/2023   K 5.2 (H) 04/02/2023   CL 90 (L) 04/02/2023   CO2 21 (L) 04/02/2023    Lab Results  Component Value Date   ALT 65 (H) 04/01/2023   AST 172 (H) 04/01/2023   ALKPHOS 39 04/01/2023     Microbiology: Recent Results (from the past 240 hour(s))  Resp panel by RT-PCR (RSV, Flu A&B,  Covid) Anterior Nasal Swab     Status: None   Collection Time: 04/01/23  8:23 AM   Specimen: Anterior Nasal Swab  Result Value Ref Range Status   SARS Coronavirus 2 by RT PCR NEGATIVE NEGATIVE Final   Influenza A by PCR NEGATIVE NEGATIVE Final   Influenza B by PCR NEGATIVE NEGATIVE Final    Comment: (NOTE) The Xpert Xpress SARS-CoV-2/FLU/RSV plus assay is intended as an aid in the diagnosis of influenza from Nasopharyngeal swab specimens and should not be used as a sole basis for treatment. Nasal washings and aspirates are unacceptable for Xpert Xpress SARS-CoV-2/FLU/RSV testing.  Fact Sheet for Patients: BloggerCourse.com  Fact Sheet for Healthcare Providers: SeriousBroker.it  This test is not yet approved or cleared by the [place] FDA and has been authorized for detection and/or diagnosis of SARS-CoV-2 by FDA under an Emergency Use Authorization (EUA). This EUA will remain in effect (meaning this test can be used) for the duration of the COVID-19 declaration under Section 564(b)(1) of the Act, 21 U.S.C. section 360bbb-3(b)(1), unless the authorization is terminated or revoked.     Resp Syncytial Virus by PCR NEGATIVE NEGATIVE Final    Comment: (NOTE) Fact Sheet for Patients: BloggerCourse.com  Fact Sheet for Healthcare Providers: SeriousBroker.it  This test is not yet approved or cleared by the [place] FDA and has been authorized for detection and/or diagnosis of SARS-CoV-2 by FDA under an Emergency Use Authorization (EUA). This EUA will remain in effect (meaning this test can be used) for the duration of the COVID-19 declaration under Section 564(b)(1) of the Act, 21 U.S.C. section 360bbb-3(b)(1), unless the authorization is terminated or revoked.  Performed at [facility], [address] [place], [place] [address]   Blood Culture (routine x 2)      Status: Abnormal (Preliminary result)   Collection Time: 04/01/23  8:51 AM   Specimen: BLOOD LEFT FOREARM  Result Value Ref Range Status   Specimen Description BLOOD LEFT FOREARM  Final   Special Requests   Final    BOTTLES DRAWN AEROBIC AND ANAEROBIC Blood Culture results may not be optimal due to an excessive volume of blood received in culture bottles   Culture  Setup Time   Final    GRAM POSITIVE COCCI IN CLUSTERS IN BOTH AEROBIC AND ANAEROBIC BOTTLES CRITICAL RESULT CALLED TO, READ BACK BY AND VERIFIED WITH: PHARMD HAILEY VONDOLEN ON 04/01/23 @ 2017 BY DRT    Culture (  A)  Final    STAPHYLOCOCCUS AUREUS SUSCEPTIBILITIES TO FOLLOW Performed at [facility], [address], [place], [place] [address]    Report Status PENDING  Incomplete  Blood Culture ID Panel (Reflexed)     Status: Abnormal   Collection Time: 04/01/23  8:51 AM  Result Value Ref Range Status   Enterococcus faecalis NOT DETECTED NOT DETECTED Final   Enterococcus Faecium NOT DETECTED NOT DETECTED Final   Listeria monocytogenes NOT DETECTED NOT DETECTED Final   Staphylococcus species DETECTED (A) NOT DETECTED Final    Comment: CRITICAL RESULT CALLED TO, READ BACK BY AND VERIFIED WITH: PHARMD HAILEY VONDOLEN ON 04/01/23 @ 2017 BY DRT    Staphylococcus aureus (BCID) DETECTED (A) NOT DETECTED Final    Comment: CRITICAL RESULT CALLED TO, READ BACK BY AND VERIFIED WITH: PHARMD HAILEY VONDOLEN ON 04/01/23 @ 2017 BY DRT    Staphylococcus epidermidis NOT DETECTED NOT DETECTED Final   Staphylococcus lugdunensis NOT DETECTED NOT DETECTED Final   Streptococcus species NOT DETECTED NOT DETECTED Final   Streptococcus agalactiae NOT DETECTED NOT DETECTED Final   Streptococcus pneumoniae NOT DETECTED NOT DETECTED Final   Streptococcus pyogenes NOT DETECTED NOT DETECTED Final   A.calcoaceticus-baumannii NOT DETECTED NOT DETECTED Final   Bacteroides fragilis NOT DETECTED NOT DETECTED Final   Enterobacterales NOT DETECTED NOT  DETECTED Final   Enterobacter cloacae complex NOT DETECTED NOT DETECTED Final   Escherichia coli NOT DETECTED NOT DETECTED Final   Klebsiella aerogenes NOT DETECTED NOT DETECTED Final   Klebsiella oxytoca NOT DETECTED NOT DETECTED Final   Klebsiella pneumoniae NOT DETECTED NOT DETECTED Final   Proteus species NOT DETECTED NOT DETECTED Final   Salmonella species NOT DETECTED NOT DETECTED Final   Serratia marcescens NOT DETECTED NOT DETECTED Final   Haemophilus influenzae NOT DETECTED NOT DETECTED Final   Neisseria meningitidis NOT DETECTED NOT DETECTED Final   Pseudomonas aeruginosa NOT DETECTED NOT DETECTED Final   Stenotrophomonas maltophilia NOT DETECTED NOT DETECTED Final   Candida albicans NOT DETECTED NOT DETECTED Final   Candida auris NOT DETECTED NOT DETECTED Final   Candida glabrata NOT DETECTED NOT DETECTED Final   Candida krusei NOT DETECTED NOT DETECTED Final   Candida parapsilosis NOT DETECTED NOT DETECTED Final   Candida tropicalis NOT DETECTED NOT DETECTED Final   Cryptococcus neoformans/gattii NOT DETECTED NOT DETECTED Final   Meth resistant mecA/C and MREJ NOT DETECTED NOT DETECTED Final    Comment: Performed at [facility], [address], [place], [place] [address]  Blood Culture (routine x 2)     Status: None (Preliminary result)   Collection Time: 04/01/23  1:09 PM   Specimen: BLOOD LEFT HAND  Result Value Ref Range Status   Specimen Description BLOOD LEFT HAND  Final   Special Requests   Final    BOTTLES DRAWN AEROBIC AND ANAEROBIC Blood Culture adequate volume   Culture   Final    NO GROWTH < 24 HOURS Performed at [facility], [address] [place], [place] [address]    Report Status PENDING  Incomplete  Culture, blood (Routine X 2) w Reflex to ID Panel     Status: None (Preliminary result)   Collection Time: 04/01/23 11:15 PM   Specimen: BLOOD  Result Value Ref Range Status   Specimen Description BLOOD BLOOD LEFT ARM  Final   Special  Requests   Final    BOTTLES DRAWN AEROBIC AND ANAEROBIC Blood Culture results may not be optimal due  to an inadequate volume of blood received in culture bottles   Culture   Final    NO GROWTH < 12 HOURS Performed at [facility], [address] [place], [place] [address]    Report Status PENDING  Incomplete  Culture, blood (Routine X 2) w Reflex to ID Panel     Status: None (Preliminary result)   Collection Time: 04/01/23 11:21 PM   Specimen: BLOOD  Result Value Ref Range Status   Specimen Description BLOOD BLOOD LEFT HAND  Final   Special Requests   Final    BOTTLES DRAWN AEROBIC AND ANAEROBIC Blood Culture results may not be optimal due to an inadequate volume of blood received in culture bottles   Culture   Final    NO GROWTH < 12 HOURS Performed at [facility], [address], [place], [place] [address]    Report Status PENDING  Incomplete  MRSA Next Gen by PCR, Nasal     Status: None   Collection Time: 04/02/23  5:42 AM   Specimen: Nasal Mucosa; Nasal Swab  Result Value Ref Range Status   MRSA by PCR Next Gen NOT DETECTED NOT DETECTED Final    Comment: (NOTE) The GeneXpert MRSA Assay (FDA approved for NASAL specimens only), is one component of a comprehensive MRSA colonization surveillance program. It is not intended to diagnose MRSA infection nor to guide or monitor treatment for MRSA infections. Test performance is not FDA approved in patients less than 73 years old. Performed at [facility], [address], [place], [place] [address]     Gardiner Barefoot, MD [email or web address] 04/02/2023, 11:43 AM

## 2023-04-02 NOTE — Consult Note (Signed)
[facility] Consultation Note  Referring Provider: [facility] Primary Care Physician:  [name], MD Primary Gastroenterologist:  Deboraha Sprang PCP  Reason for Consultation:  Nausea, vomiting  HPI: Peter [name] is a 72 y.o. male admitted fevers, staphylococcal bacteremia, fevers.  No abdominal pain.  Had some nausea, vomiting.  Is improving.  No blood in stool.  No obvious prior endoscopy.  Colonoscopy 2020 Dr. Evette Cristal showing polyps and repeat 2025 advised.   Past Medical History:  Diagnosis Date   Anemia in chronic kidney disease (CKD)    CKD (chronic kidney disease) stage 5, GFR less than 15 ml/min (HCC)    M-W-F   Diabetes mellitus without complication (HCC) 03/2012   Hx type II - no meds, diet controlled, lost weight   ED (erectile dysfunction)    Heart murmur    not significant   History of kidney stones    passed 1 , 1 was removed with kidney disease in 2010   Hypercholesteremia    Hypertension    Iron deficiency    Kidney stone 2010   MVP (mitral valve prolapse)    Posterior with MR by ECHO   OSA (obstructive sleep apnea)    does not use cpap, lost weight    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   Syncope     Past Surgical History:  Procedure Laterality Date   A/V FISTULAGRAM Left 02/10/2018   Procedure: A/V FISTULAGRAM;  Surgeon: Nada Libman, MD;  Location: [facility];  Service: Cardiovascular;  Laterality: Left;   APPLICATION OF WOUND VAC Right 03/15/2021   Procedure: APPLICATION OF WOUND VAC;  Surgeon: Leonie Douglas, MD;  Location: [facility] OR;  Service: Vascular;  Laterality: Right;   AV FISTULA PLACEMENT Left 06/14/2016   Procedure: ARTERIOVENOUS (AV) FISTULA CREATION LEFT ARM;  Surgeon: Chuck Hint, MD;  Location: [facility] OR;  Service: Vascular;  Laterality: Left;   AV FISTULA PLACEMENT Right 06/09/2018   Procedure: CREATION Right arm Brachiocephalic;  Surgeon: Maeola Harman, MD;  Location: [facility]  OR;  Service: Vascular;  Laterality: Right;   AV FISTULA PLACEMENT Right 01/16/2023   Procedure: INSERTION OF RIGHT ARM ARTERIOVENOUS (AV) GORE-TEX GRAFT;  Surgeon: Leonie Douglas, MD;  Location: [facility] OR;  Service: Vascular;  Laterality: Right;   BASCILIC VEIN TRANSPOSITION Right 09/10/2018   Procedure: CEPHALIC VEIN TRANSPOSITION;  Surgeon: Maeola Harman, MD;  Location: [facility] OR;  Service: Vascular;  Laterality: Right;   BASCILIC VEIN TRANSPOSITION Left 01/25/2021   Procedure: LEFT ARMFIRST STAGE BASCILIC VEIN TRANSPOSITION;  Surgeon: Leonie Douglas, MD;  Location: [facility] OR;  Service: Vascular;  Laterality: Left;  PERIPHERAL NERVE BLOCK   BASCILIC VEIN TRANSPOSITION Left 03/15/2021   Procedure: LEFT UPPER ARM SECOND STAGE BASILIC TRANSPOSITION;  Surgeon: Leonie Douglas, MD;  Location: [facility] OR;  Service: Vascular;  Laterality: Left;   COLONOSCOPY     INSERTION OF DIALYSIS CATHETER N/A 06/09/2018   Procedure: INSERTION OF DIALYSIS CATHETER;  Surgeon: Maeola Harman, MD;  Location: [facility] OR;  Service: Vascular;  Laterality: N/A;   JOINT REPLACEMENT Left 2010   hip   PARTIAL NEPHRECTOMY Left 2010   PERIPHERAL VASCULAR INTERVENTION Left 02/10/2018   Procedure: PERIPHERAL VASCULAR INTERVENTION;  Surgeon: Nada Libman, MD;  Location: [facility];  Service: Cardiovascular;  Laterality: Left;   TONSILLECTOMY      Prior to Admission medications   Medication Sig Start Date End Date Taking? Authorizing  Provider  apixaban (ELIQUIS) 5 MG TABS tablet Take 1 tablet (5 mg total) by mouth 2 (two) times daily. 02/26/23   Rollene Rotunda, MD  apixaban (ELIQUIS) 5 MG TABS tablet Take 1 tablet (5 mg total) by mouth 2 (two) times daily. 02/26/23   Rollene Rotunda, MD  atorvastatin (LIPITOR) 10 MG tablet Take 10 mg by mouth at bedtime.    [provider]  AURYXIA 1 GM 210 MG(Fe) tablet Take 420-630 mg by mouth See admin instructions. Take 630mg  by mouth three times daily with meals and  take 420mg  by mouth with snacks.    [provider]  cinacalcet (SENSIPAR) 90 MG tablet Take 180 mg by mouth daily.    [provider]  traMADol (ULTRAM) 50 MG tablet Take 1 tablet (50 mg total) by mouth every 6 (six) hours as needed. Patient not taking: Reported on 03/20/2023 01/16/23 01/16/24  Loel Dubonnet P, PA-C    Current Facility-Administered Medications  Medication Dose Route Frequency Provider Last Rate Last Admin   (feeding supplement) PROSource Plus liquid 30 mL  30 mL Oral BID BM Julien Nordmann, PA-C   30 mL at 04/02/23 1301   acetaminophen (TYLENOL) tablet 650 mg  650 mg Oral Q6H PRN Julien Nordmann, PA-C   650 mg at 04/02/23 1023   Ampicillin-Sulbactam (UNASYN) 3 g in sodium chloride 0.9 % 100 mL IVPB  3 g Intravenous Daily Reome, Earle J, RPH 200 mL/hr at 04/02/23 1308 3 g at 04/02/23 1308   atorvastatin (LIPITOR) tablet 10 mg  10 mg Oral QHS Kathlen Mody, MD   10 mg at 04/01/23 2221   bupivacaine(PF) (MARCAINE) 0.5 % injection 10 mL  10 mL Infiltration Once Freeman Caldron, PA-C       Chlorhexidine Gluconate Cloth 2 % PADS 6 each  6 each Topical Daily Kathlen Mody, MD   6 each at 04/02/23 1020   [START ON 04/03/2023] Chlorhexidine Gluconate Cloth 2 % PADS 6 each  6 each Topical Q0600 Maxie Barb, MD       cinacalcet [facility]) tablet 180 mg  180 mg Oral Q breakfast Kathlen Mody, MD   180 mg at 04/02/23 1020   diltiazem (CARDIZEM) injection 10 mg  10 mg Intravenous Q6H PRN Leroy Sea, MD       diltiazem (CARDIZEM) tablet 60 mg  60 mg Oral Q12H Leroy Sea, MD       ferric citrate (AURYXIA) tablet 630 mg  630 mg Oral TID WC Kathlen Mody, MD   630 mg at 04/02/23 1259   heparin ADULT infusion 100 units/mL (25000 units/243mL)  1,200 Units/hr Intravenous Continuous Reome, Earle J, RPH       lidocaine (PF) (XYLOCAINE) 1 % injection 5 mL  5 mL Intradermal PRN Julien Nordmann, PA-C       Oral care mouth rinse  15 mL Mouth Rinse PRN  Kathlen Mody, MD       pantoprazole (PROTONIX) injection 40 mg  40 mg Intravenous Q12H Leroy Sea, MD   40 mg at 04/02/23 1019    Allergies as of 04/01/2023 - Review Complete 04/01/2023  Allergen Reaction Noted   Penicillins Other (See Comments) 09/27/2013   Doxazosin mesylate  11/30/2020   Triamterene-hctz  11/30/2020    Family History  Problem Relation Age of Onset   Dementia Mother    Hypertension Father     Social History   Socioeconomic History   Marital status: Media planner  Spouse name: Not on file   Number of children: Not on file   Years of education: Not on file   Highest education level: Not on file  Occupational History   Not on file  Tobacco Use   Smoking status: Former    Types: Cigarettes    Quit date: 06/13/1986    Years since quitting: 36.8   Smokeless tobacco: Current    Types: Chew  Vaping Use   Vaping Use: Never used  Substance and Sexual Activity   Alcohol use: No   Drug use: No   Sexual activity: Yes  Other Topics Concern   Not on file  Social History Narrative   Lives alone.   Daughter.     Social Determinants of Health   Financial Resource Strain: Low Risk  (05/30/2018)   Overall Financial Resource Strain (CARDIA)    Difficulty of Paying Living Expenses: Not hard at all  Food Insecurity: No Food Insecurity (04/01/2023)   Hunger Vital Sign    Worried About Running Out of Food in the Last Year: Never true    Ran Out of Food in the Last Year: Never true  Transportation Needs: No Transportation Needs (04/01/2023)   PRAPARE - Administrator, Civil Service (Medical): No    Lack of Transportation (Non-Medical): No  Physical Activity: Inactive (05/30/2018)   Exercise Vital Sign    Days of Exercise per Week: 0 days    Minutes of Exercise per Session: 0 min  Stress: No Stress Concern Present (05/30/2018)   [employer] of Occupational Health - Occupational Stress Questionnaire    Feeling of Stress : Only a little   Social Connections: Socially Integrated (05/30/2018)   Social Connection and Isolation Panel [NHANES]    Frequency of Communication with Friends and Family: More than three times a week    Frequency of Social Gatherings with Friends and Family: More than three times a week    Attends Religious Services: More than 4 times per year    Active Member of [employer] or Organizations: Yes    Attends Banker Meetings: More than 4 times per year    Marital Status: Living with partner  Intimate Partner Violence: Not At Risk (04/01/2023)   Humiliation, Afraid, Rape, and Kick questionnaire    Fear of Current or Ex-Partner: No    Emotionally Abused: No    Physically Abused: No    Sexually Abused: No    Review of Systems: As per HPI, all others negative  Physical Exam: Vital signs in last 24 hours: Temp:  [97.3 F (36.3 C)-100.4 F (38 C)] 98.6 F (37 C) (07/03 1310) Pulse Rate:  [50-126] 126 (07/03 1310) Resp:  [20-32] 20 (07/03 0418) BP: (83-144)/(41-91) 128/70 (07/03 1310) SpO2:  [91 %-100 %] 93 % (07/03 1310) Weight:  [97.6 kg-98.2 kg] 97.6 kg (07/02 2031)   General:   Alert, older-appearing than stated age, cooperative in NAD Head:  Normocephalic and atraumatic. Eyes:  Sclera clear, no icterus.   Conjunctiva pink. Ears:  Normal auditory acuity. Nose:  No deformity, discharge,  or lesions. Mouth:  No deformity or lesions.  Oropharynx pink & moist. Neck:  Supple; no masses or thyromegaly. Lungs:  Mild dyspnea Abdomen:  Soft, nontender and nondistended. No masses, hepatosplenomegaly or hernias noted. Without guarding, and without rebound.     Msk:  Symmetrical without gross deformities. Normal posture. Pulses:  Normal pulses noted. Extremities:  Without clubbing or edema. Neurologic:  Alert and  oriented  x4; diffusely weak, otherwise grossly normal neurologically. Skin:  Intact without significant lesions or rashes. Psych:  Alert and cooperative. Normal mood and  affect.   Lab Results: Recent Labs    04/01/23 0851 04/02/23 0340  WBC 13.8* 9.0  HGB 13.8 12.9*  HCT 40.6 37.0*  PLT 85* 77*   BMET Recent Labs    04/01/23 0851 04/01/23 1447 04/02/23 0555  NA 129* 127* 131*  K 5.3* 5.6* 5.2*  CL 91* 93* 90*  CO2 18* 14* 21*  GLUCOSE 136* 125* 117*  BUN 102* 104* 58*  CREATININE 13.88* 13.58* 7.96*  CALCIUM 9.8 9.1 9.1   LFT Recent Labs    04/01/23 1447 04/02/23 0555  PROT 6.6  --   ALBUMIN 2.5* 2.3*  AST 172*  --   ALT 65*  --   ALKPHOS 39  --   BILITOT 1.0  --    PT/INR Recent Labs    04/01/23 0851  LABPROT 19.0*  INR 1.6*    Studies/Results: CT ABDOMEN PELVIS W CONTRAST  Result Date: 04/02/2023 CLINICAL DATA:  Abdominal pain, acute, nonlocalized. End stage renal disease. Nausea and vomiting. Sepsis. EXAM: CT ABDOMEN AND PELVIS WITH CONTRAST TECHNIQUE: Multidetector CT imaging of the abdomen and pelvis was performed using the standard protocol following bolus administration of intravenous contrast. RADIATION DOSE REDUCTION: This exam was performed according to the departmental dose-optimization program which includes automated exposure control, adjustment of the mA and/or kV according to patient size and/or use of iterative reconstruction technique. CONTRAST:  75mL OMNIPAQUE IOHEXOL 350 MG/ML SOLN COMPARISON:  04/05/2009 FINDINGS: Lower chest: Mild dependent atelectasis at the lung bases. Tiny amount of pleural fluid. Non dependent lung as well aerated. There is cardiomegaly and coronary artery calcification. Hepatobiliary: No liver parenchymal abnormality. No calcified gallstones. No ductal dilatation. Pancreas: Normal Spleen: Normal Adrenals/Urinary Tract: Adrenal glands are normal. Atrophic kidneys, more so on the left than the right. No hydroureteronephrosis. No stone or mass. Bladder is not well seen because of artifact from a left hip replacement. No bladder distension. Stomach/Bowel: Stomach and small intestine are normal.  Normal appendix. No abnormal colon finding. Diverticulosis without evidence of diverticulitis. Vascular/Lymphatic: Aorta and IVC are normal.  No adenopathy. Reproductive: Normal Other: No free fluid or air. Musculoskeletal: Ordinary mild degenerative change affects the spine. IMPRESSION: 1. No acute finding by CT. 2. Atrophic kidneys, more so on the left than the right. 3. Diverticulosis without evidence of diverticulitis. 4. Cardiomegaly and coronary artery calcification. 5. Mild dependent atelectasis at the lung bases. Tiny amount of pleural fluid. Electronically Signed   By: Paulina Fusi M.D.   On: 04/02/2023 13:01   VAS [place] LOWER EXTREMITY VENOUS (DVT)  Result Date: 04/02/2023  Lower Venous DVT Study Patient Name:  Peter [name]  Date of Exam:   04/02/2023 Medical Rec #: [id number]          Accession #:    [id number] Date of Birth: Sep 27, 1951          Patient Gender: M Patient Age:   5 years Exam Location:  [facility] Procedure:      VAS [place] LOWER EXTREMITY VENOUS (DVT) Referring Phys: Kathlen Mody --------------------------------------------------------------------------------  Indications: Swelling.  Limitations: Body habitus and poor ultrasound/tissue interface. Comparison Study: Previous 01/09/18 negative. Performing Technologist: McKayla Maag RVT, [place]  Examination Guidelines: A complete evaluation includes B-mode imaging, spectral Doppler, color Doppler, and power Doppler as needed of all accessible portions of each vessel. Bilateral testing is considered an integral  part of a complete examination. Limited examinations for reoccurring indications may be performed as noted. The reflux portion of the exam is performed with the patient in reverse Trendelenburg.  +-----+---------------+---------+-----------+----------+--------------+ RIGHTCompressibilityPhasicitySpontaneityPropertiesThrombus Aging +-----+---------------+---------+-----------+----------+--------------+ CFV  Full           Yes       Yes                                 +-----+---------------+---------+-----------+----------+--------------+ SFJ  Full                                                        +-----+---------------+---------+-----------+----------+--------------+   +---------+---------------+---------+-----------+----------+-------------------+ LEFT     CompressibilityPhasicitySpontaneityPropertiesThrombus Aging      +---------+---------------+---------+-----------+----------+-------------------+ CFV      Full           Yes      Yes                                      +---------+---------------+---------+-----------+----------+-------------------+ SFJ      Full                                                             +---------+---------------+---------+-----------+----------+-------------------+ FV Prox  Full                                                             +---------+---------------+---------+-----------+----------+-------------------+ FV Mid   Full                                                             +---------+---------------+---------+-----------+----------+-------------------+ FV DistalFull                                                             +---------+---------------+---------+-----------+----------+-------------------+ PFV      Full                                                             +---------+---------------+---------+-----------+----------+-------------------+ POP      Full           Yes      Yes                                      +---------+---------------+---------+-----------+----------+-------------------+  PTV      Full                                                             +---------+---------------+---------+-----------+----------+-------------------+ PERO                                                  Not well visualized  +---------+---------------+---------+-----------+----------+-------------------+    Summary: RIGHT: - No evidence of common femoral vein obstruction.  LEFT: - There is no evidence of deep vein thrombosis in the lower extremity. However, portions of this examination were limited- see technologist comments above.  - No cystic structure found in the popliteal fossa.  *See table(s) above for measurements and observations.    Preliminary    DG Abd Portable 1V  Result Date: 04/02/2023 CLINICAL DATA:  Nausea and vomiting EXAM: PORTABLE ABDOMEN - 1 VIEW COMPARISON:  Abdominal CT 04/05/2009 FINDINGS: Normal bowel gas pattern. Stool at the rectum without generalized abnormal stool retention. No concerning mass effect or calcification. Cardiomegaly with clear lung bases. Generalized spondylosis. Unremarkable left hip replacement. IMPRESSION: Normal bowel gas pattern. Electronically Signed   By: Tiburcio Pea M.D.   On: 04/02/2023 06:59   DG Chest Port 1 View  Result Date: 04/02/2023 CLINICAL DATA:  Shortness of breath EXAM: PORTABLE CHEST 1 VIEW COMPARISON:  04/01/2023 FINDINGS: Chronic cardiomegaly. Stable mediastinal contours. Low volume but clear lungs with no effusion or pneumothorax. Artifact from EKG leads. IMPRESSION: Stable low volume chest with cardiomegaly. Electronically Signed   By: Tiburcio Pea M.D.   On: 04/02/2023 06:58   DG Shoulder Left  Result Date: 04/01/2023 CLINICAL DATA:  Pain after fall EXAM: LEFT SHOULDER - 2 VIEW COMPARISON:  None Available. FINDINGS: Osteopenia. Osteophyte formation of the glenohumeral joint and AC joint with some joint space loss. There is also elevation of the humeral head relative to the acromion. Please correlate for a full-thickness rotator cuff tear. No acute fracture or dislocation. Surgical clips in the left axillary region. IMPRESSION: Moderate degenerative changes. High-riding humeral head. Please correlate for a full-thickness rotator cuff tear Electronically  Signed   By: Karen Kays M.D.   On: 04/01/2023 11:04   DG Knee Complete 4 Views Right  Result Date: 04/01/2023 CLINICAL DATA:  Pain after fall EXAM: RIGHT KNEE - COMPLETE 4 VIEW COMPARISON:  None Available. FINDINGS: Moderate joint space loss of the medial compartment. Small osteophytes seen of all 3 compartments greatest of the patellofemoral joint. There is a moderate joint effusion. Hyperostosis. Osteopenia. No fracture or dislocation. IMPRESSION: Tricompartment degenerative changes identified, overall moderate greatest involving the medial compartment and patellofemoral joint. Moderate joint effusion of uncertain etiology. This has a broad differential. This could be related to the degenerative changes. However please correlate for history of trauma or infection. Electronically Signed   By: Karen Kays M.D.   On: 04/01/2023 11:03   DG Knee Complete 4 Views Left  Result Date: 04/01/2023 CLINICAL DATA:  Pain after fall.  Weakness for 2 days EXAM: LEFT KNEE - COMPLETE 4 VIEW COMPARISON:  None Available. FINDINGS: Osteopenia. Slight joint space loss of the medial compartment. Small osteophytes of all 3 compartments.  No joint effusion on lateral view. No fracture or dislocation. Hyperostosis of the patella. Vascular calcifications. IMPRESSION: Mild degenerative changes.  No joint effusion. Electronically Signed   By: Karen Kays M.D.   On: 04/01/2023 11:00   CT Head Wo Contrast  Result Date: 04/01/2023 CLINICAL DATA:  Provided history: Polytrauma, blunt. EXAM: CT HEAD WITHOUT CONTRAST CT CERVICAL SPINE WITHOUT CONTRAST TECHNIQUE: Multidetector CT imaging of the head and cervical spine was performed following the standard protocol without intravenous contrast. Multiplanar CT image reconstructions of the cervical spine were also generated. RADIATION DOSE REDUCTION: This exam was performed according to the departmental dose-optimization program which includes automated exposure control, adjustment of the mA  and/or kV according to patient size and/or use of iterative reconstruction technique. COMPARISON:  None. FINDINGS: CT HEAD FINDINGS Brain: Mild generalized parenchymal atrophy. There is no acute intracranial hemorrhage. No demarcated cortical infarct. No extra-axial fluid collection. No evidence of an intracranial mass. No midline shift. Vascular: No hyperdense vessel.  Atherosclerotic calcifications. Skull: No calvarial fracture or aggressive osseous lesion. Sinuses/Orbits: No mass or acute finding within the imaged orbits. Mild-to-moderate mucosal thickening, and small volume frothy secretions, within the right maxillary sinus. Small mucous retention cyst within the left maxillary sinus. CT CERVICAL SPINE FINDINGS Alignment: No significant spondylolisthesis. Skull base and vertebrae: The basion-dental and atlanto-dental intervals are maintained.No evidence of acute fracture to the cervical spine. Soft tissues and spinal canal: No prevertebral fluid or swelling. No visible canal hematoma. Disc levels: Cervical spondylosis with multilevel disc space narrowing, disc bulges/central disc protrusions, endplate spurring, uncovertebral hypertrophy and facet arthrosis. Disc space narrowing is greatest at C5-C6 (moderate/advanced), C7-T1 (moderate/advanced), T1-T2 (advanced) and T2-T3 (advanced). 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 Most notably at C2-C3 and C3-C4, central disc protrusions contribute to at least moderate spinal canal stenosis. Multilevel bony neural foraminal narrowing. Upper chest: No consolidation within the imaged lung apices. No visible pneumothorax. IMPRESSION: CT head: 1.  No evidence of an acute intracranial abnormality. 2. Mild generalized parenchymal atrophy. 3. Paranasal sinus disease as described. CT cervical spine: 1. No evidence of an acute cervical spine fracture. 2. Cervical spondylosis as outlined. Notably at C2-C3 and C3-C4,  central disc protrusions contribute to at least moderate spinal canal stenosis. Multilevel bony neural from narrowing. Electronically Signed   By: Jackey Loge D.O.   On: 04/01/2023 10:32   CT Cervical Spine Wo Contrast  Result Date: 04/01/2023 CLINICAL DATA:  Provided history: Polytrauma, blunt. EXAM: CT HEAD WITHOUT CONTRAST CT CERVICAL SPINE WITHOUT CONTRAST TECHNIQUE: Multidetector CT imaging of the head and cervical spine was performed following the standard protocol without intravenous contrast. Multiplanar CT image reconstructions of the cervical spine were also generated. RADIATION DOSE REDUCTION: This exam was performed according to the departmental dose-optimization program which includes automated exposure control, adjustment of the mA and/or kV according to patient size and/or use of iterative reconstruction technique. COMPARISON:  None. FINDINGS: CT HEAD FINDINGS Brain: Mild generalized parenchymal atrophy. There is no acute intracranial hemorrhage. No demarcated cortical infarct. No extra-axial fluid collection. No evidence of an intracranial mass. No midline shift. Vascular: No hyperdense vessel.  Atherosclerotic calcifications. Skull: No calvarial fracture or aggressive osseous lesion. Sinuses/Orbits: No mass or acute finding within the imaged orbits. Mild-to-moderate mucosal thickening, and small volume frothy secretions, within the right maxillary sinus. Small mucous retention cyst within the left maxillary sinus. CT CERVICAL SPINE FINDINGS Alignment: No significant spondylolisthesis. Skull base and vertebrae: The basion-dental and  atlanto-dental intervals are maintained.No evidence of acute fracture to the cervical spine. Soft tissues and spinal canal: No prevertebral fluid or swelling. No visible canal hematoma. Disc levels: Cervical spondylosis with multilevel disc space narrowing, disc bulges/central disc protrusions, endplate spurring, uncovertebral hypertrophy and facet arthrosis. Disc  space narrowing is greatest at C5-C6 (moderate/advanced), C7-T1 (moderate/advanced), T1-T2 (advanced) and T2-T3 (advanced). 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 Most notably at C2-C3 and C3-C4, central disc protrusions contribute to at least moderate spinal canal stenosis. Multilevel bony neural foraminal narrowing. Upper chest: No consolidation within the imaged lung apices. No visible pneumothorax. IMPRESSION: CT head: 1.  No evidence of an acute intracranial abnormality. 2. Mild generalized parenchymal atrophy. 3. Paranasal sinus disease as described. CT cervical spine: 1. No evidence of an acute cervical spine fracture. 2. Cervical spondylosis as outlined. Notably at C2-C3 and C3-C4, central disc protrusions contribute to at least moderate spinal canal stenosis. Multilevel bony neural from narrowing. Electronically Signed   By: Jackey Loge D.O.   On: 04/01/2023 10:32   DG Chest Portable 1 View  Result Date: 04/01/2023 CLINICAL DATA:  Shortness of breath EXAM: PORTABLE CHEST 1 VIEW COMPARISON:  Chest radiograph dated 06/09/2018 FINDINGS: The medial apices are obscured due to overlying mandible. Low lung volumes with bronchovascular crowding. Medial right lower lung patchy opacities. No pleural effusion or pneumothorax. Similar enlarged cardiomediastinal silhouette. No acute osseous abnormality. IMPRESSION: 1. Medial right lower lung patchy opacities, which may represent atelectasis, aspiration, or pneumonia. 2. Similar cardiomegaly. Electronically Signed   By: Agustin Cree M.D.   On: 04/01/2023 08:50    Impression:   Nausea, vomiting.  Patient reports feeling better now in this regard. Suspect multifactorial, but principally from infection. Elevated LFTs.  Likely at least in part infection-mediated. Fevers, aspiration pneumonia, staphylococcal bacteremia.  Plan:   Antiemetics, cautious IVF, PPI, supportive care. Treatment for infection. Do  not see need for endoscopy or further GI work-up at the present time. [facility] will sign-off; please call back with questions; thank you for the consultation.   LOS: 1 day   Jandi Swiger M  04/02/2023, 3:03 PM  Cell [phone] If no answer or after 5 PM call [phone]

## 2023-04-02 NOTE — Evaluation (Signed)
Clinical/Bedside Swallow Evaluation Patient Details  Name: Peter [name] MRN: [id number] Date of Birth: 12-Feb-1951  Today's Date: 04/02/2023 Time: SLP Start Time (ACUTE ONLY): 1330 SLP Stop Time (ACUTE ONLY): 1345 SLP Time Calculation (min) (ACUTE ONLY): 15 min  Past Medical History:  Past Medical History:  Diagnosis Date   Anemia in chronic kidney disease (CKD)    CKD (chronic kidney disease) stage 5, GFR less than 15 ml/min (HCC)    M-W-F   Diabetes mellitus without complication (HCC) 03/2012   Hx type II - no meds, diet controlled, lost weight   ED (erectile dysfunction)    Heart murmur    not significant   History of kidney stones    passed 1 , 1 was removed with kidney disease in 2010   Hypercholesteremia    Hypertension    Iron deficiency    Kidney stone 2010   MVP (mitral valve prolapse)    Posterior with MR by ECHO   OSA (obstructive sleep apnea)    does not use cpap, lost weight    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   Syncope    Past Surgical History:  Past Surgical History:  Procedure Laterality Date   A/V FISTULAGRAM Left 02/10/2018   Procedure: A/V FISTULAGRAM;  Surgeon: Nada Libman, MD;  Location: [facility];  Service: Cardiovascular;  Laterality: Left;   APPLICATION OF WOUND VAC Right 03/15/2021   Procedure: APPLICATION OF WOUND VAC;  Surgeon: Leonie Douglas, MD;  Location: [facility] OR;  Service: Vascular;  Laterality: Right;   AV FISTULA PLACEMENT Left 06/14/2016   Procedure: ARTERIOVENOUS (AV) FISTULA CREATION LEFT ARM;  Surgeon: Chuck Hint, MD;  Location: [facility] OR;  Service: Vascular;  Laterality: Left;   AV FISTULA PLACEMENT Right 06/09/2018   Procedure: CREATION Right arm Brachiocephalic;  Surgeon: Maeola Harman, MD;  Location: [facility] OR;  Service: Vascular;  Laterality: Right;   AV FISTULA PLACEMENT Right 01/16/2023   Procedure: INSERTION OF RIGHT ARM ARTERIOVENOUS (AV) GORE-TEX GRAFT;  Surgeon: Leonie Douglas, MD;  Location: [facility] OR;  Service: Vascular;  Laterality: Right;   BASCILIC VEIN TRANSPOSITION Right 09/10/2018   Procedure: CEPHALIC VEIN TRANSPOSITION;  Surgeon: Maeola Harman, MD;  Location: [facility] OR;  Service: Vascular;  Laterality: Right;   BASCILIC VEIN TRANSPOSITION Left 01/25/2021   Procedure: LEFT ARMFIRST STAGE BASCILIC VEIN TRANSPOSITION;  Surgeon: Leonie Douglas, MD;  Location: [facility] OR;  Service: Vascular;  Laterality: Left;  PERIPHERAL NERVE BLOCK   BASCILIC VEIN TRANSPOSITION Left 03/15/2021   Procedure: LEFT UPPER ARM SECOND STAGE BASILIC TRANSPOSITION;  Surgeon: Leonie Douglas, MD;  Location: [facility] OR;  Service: Vascular;  Laterality: Left;   COLONOSCOPY     INSERTION OF DIALYSIS CATHETER N/A 06/09/2018   Procedure: INSERTION OF DIALYSIS CATHETER;  Surgeon: Maeola Harman, MD;  Location: [facility] OR;  Service: Vascular;  Laterality: N/A;   JOINT REPLACEMENT Left 2010   hip   PARTIAL NEPHRECTOMY Left 2010   PERIPHERAL VASCULAR INTERVENTION Left 02/10/2018   Procedure: PERIPHERAL VASCULAR INTERVENTION;  Surgeon: Nada Libman, MD;  Location: [facility];  Service: Cardiovascular;  Laterality: Left;   TONSILLECTOMY     HPI:  Patient is a 72 y.o. male with PMH ESRD on HD, DM-2, HTN, OSA, a-fib on Eliquis, HLD, anemia, CKD who presented to the hospital on 7/2/202 with generalized weakness, fall, low-grade fever. In ER he was found to be septic and  admitted to the hospital. CT head negative for acute intracranial abnormality. CT chest from 7/2 reports  Medial right lower lung patchy opacities, which may represent  atelectasis, aspiration, or pneumonia and repeat CXR on 7/3 showed low volume but clear lungs with no effusion or pneumothorax.    Assessment / Plan / Recommendation  Clinical Impression  Patient is not currently presenting with clinical s/s of dysphagia as per this bedside swallow evaluation. No h/o dysphagia found in medical chart and patient and  friend who was in the room both deny h/o dysphagia. Patient has been lethargic and drowsy this afternoon but he was able to maintain adequate alertness. Patient's swallow assessed via straw sips of thin liquids (soda) with SLP noting timely swallow initiation and no overt s/s aspiration. No significant change in vitals from PO intake, however patient did have instances of increased HR and increased RR that seemed to correlate more with SLP raising HOB as opposed to PO intake. His friend reported that patient gets SOB from activities but not typically when eating meals. SLP is not recommending further skilled intervention but is recommending patient take rest breaks and conserve energy during PO intake. SLP Visit Diagnosis: Dysphagia, unspecified (R13.10)    Aspiration Risk  No limitations    Diet Recommendation Thin liquid;Regular    Liquid Administration via: Cup;Straw Supervision: Patient able to self feed;Staff to assist with self feeding Compensations: Small sips/bites;Slow rate Postural Changes: Seated upright at 90 degrees    Other  Recommendations Oral Care Recommendations: Oral care BID    Recommendations for follow up therapy are one component of a multi-disciplinary discharge planning process, led by the attending physician.  Recommendations may be updated based on patient status, additional functional criteria and insurance authorization.  Follow up Recommendations No SLP follow up      Assistance Recommended at Discharge    Functional Status Assessment Patient has not had a recent decline in their functional status  Frequency and Duration      N/A      Prognosis        Swallow Study   General Date of Onset: 04/01/23 HPI: Patient is a 72 y.o. male with PMH ESRD on HD, DM-2, HTN, OSA, a-fib on Eliquis, HLD, anemia, CKD who presented to the hospital on 7/2/202 with generalized weakness, fall, low-grade fever. In ER he was found to be septic and admitted to the hospital. CT  head negative for acute intracranial abnormality. CT chest from 7/2 reports  Medial right lower lung patchy opacities, which may represent  atelectasis, aspiration, or pneumonia and repeat CXR on 7/3 showed low volume but clear lungs with no effusion or pneumothorax. Type of Study: Bedside Swallow Evaluation Previous Swallow Assessment: none found Diet Prior to this Study: Regular;Thin liquids (Level 0) Temperature Spikes Noted: No Respiratory Status: Room air History of Recent Intubation: No Behavior/Cognition: Alert;Cooperative;Pleasant mood;Lethargic/Drowsy Oral Cavity Assessment: Within Functional Limits Oral Care Completed by SLP: No Oral Cavity - Dentition: Adequate natural dentition Vision: Functional for self-feeding Self-Feeding Abilities: Needs assist;Needs set up Patient Positioning: Partially reclined Baseline Vocal Quality: Low vocal intensity;Hoarse Volitional Cough: Strong Volitional Swallow: Able to elicit    Oral/Motor/Sensory Function Overall Oral Motor/Sensory Function: Within functional limits   Ice Chips     Thin Liquid Thin Liquid: Within functional limits Presentation: Straw    Nectar Thick     Honey Thick     Puree Puree: Not tested   Solid     Solid: Not tested  Sonia Baller, MA, CCC-SLP Speech Therapy

## 2023-04-02 NOTE — Consult Note (Signed)
Reason for Consult:Right knee pain Referring Physician: Susa Raring Time called: 1411 Time at bedside: [address] Bitler is an 72 y.o. male.  HPI: Peter [name] was admitted yesterday with sepsis. Source is still unknown. He has right knee pain and effusion and orthopedic surgery was consulted to r/o septic joint. Complicating factors is that he suffered a fall Sunday where he landed on that knee. He pins the onset of pain and swelling to that fall.  Past Medical History:  Diagnosis Date   Anemia in chronic kidney disease (CKD)    CKD (chronic kidney disease) stage 5, GFR less than 15 ml/min (HCC)    M-W-F   Diabetes mellitus without complication (HCC) 03/2012   Hx type II - no meds, diet controlled, lost weight   ED (erectile dysfunction)    Heart murmur    not significant   History of kidney stones    passed 1 , 1 was removed with kidney disease in 2010   Hypercholesteremia    Hypertension    Iron deficiency    Kidney stone 2010   MVP (mitral valve prolapse)    Posterior with MR by ECHO   OSA (obstructive sleep apnea)    does not use cpap, lost weight    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   Syncope     Past Surgical History:  Procedure Laterality Date   A/V FISTULAGRAM Left 02/10/2018   Procedure: A/V FISTULAGRAM;  Surgeon: Nada Libman, MD;  [facility];  Service: Cardiovascular;  Laterality: Left;   APPLICATION OF WOUND VAC Right 03/15/2021   Procedure: APPLICATION OF WOUND VAC;  Surgeon: Leonie Douglas, MD;  Location: [facility] OR;  Service: Vascular;  Laterality: Right;   AV FISTULA PLACEMENT Left 06/14/2016   Procedure: ARTERIOVENOUS (AV) FISTULA CREATION LEFT ARM;  Surgeon: Chuck Hint, MD;  Location: [facility] OR;  Service: Vascular;  Laterality: Left;   AV FISTULA PLACEMENT Right 06/09/2018   Procedure: CREATION Right arm Brachiocephalic;  Surgeon: Maeola Harman, MD;  Location: [facility] OR;  Service: Vascular;   Laterality: Right;   AV FISTULA PLACEMENT Right 01/16/2023   Procedure: INSERTION OF RIGHT ARM ARTERIOVENOUS (AV) GORE-TEX GRAFT;  Surgeon: Leonie Douglas, MD;  [facility];  Service: Vascular;  Laterality: Right;   BASCILIC VEIN TRANSPOSITION Right 09/10/2018   Procedure: CEPHALIC VEIN TRANSPOSITION;  Surgeon: Maeola Harman, MD;  Location: [facility] OR;  Service: Vascular;  Laterality: Right;   BASCILIC VEIN TRANSPOSITION Left 01/25/2021   Procedure: LEFT ARMFIRST STAGE BASCILIC VEIN TRANSPOSITION;  Surgeon: Leonie Douglas, MD;  [facility];  Service: Vascular;  Laterality: Left;  PERIPHERAL NERVE BLOCK   BASCILIC VEIN TRANSPOSITION Left 03/15/2021   Procedure: LEFT UPPER ARM SECOND STAGE BASILIC TRANSPOSITION;  Surgeon: Leonie Douglas, MD;  [facility];  Service: Vascular;  Laterality: Left;   COLONOSCOPY     INSERTION OF DIALYSIS CATHETER N/A 06/09/2018   Procedure: INSERTION OF DIALYSIS CATHETER;  Surgeon: Maeola Harman, MD;  Location: [facility] OR;  Service: Vascular;  Laterality: N/A;   JOINT REPLACEMENT Left 2010   hip   PARTIAL NEPHRECTOMY Left 2010   PERIPHERAL VASCULAR INTERVENTION Left 02/10/2018   Procedure: PERIPHERAL VASCULAR INTERVENTION;  Surgeon: Nada Libman, MD;  [facility];  Service: Cardiovascular;  Laterality: Left;   TONSILLECTOMY      Family History  Problem Relation Age of Onset   Dementia  Mother    Hypertension Father     Social History:  reports that he quit smoking about 36 years ago. His smoking use included cigarettes. His smokeless tobacco use includes chew. He reports that he does not drink alcohol and does not use drugs.  Allergies:  Allergies  Allergen Reactions   Penicillins Other (See Comments)    UNSPECIFIED REACTION Childhood allergy    Doxazosin Mesylate     ineffective for BP (10/2014)   Triamterene-Hctz     increased creatinine (see 09/2014 office note)    Medications: I have reviewed the  patient's current medications.  Results for orders placed or performed during the hospital encounter of 04/01/23 (from the past 48 hour(s))  Resp panel by RT-PCR (RSV, Flu A&B, Covid) Anterior Nasal Swab     Status: None   Collection Time: 04/01/23  8:23 AM   Specimen: Anterior Nasal Swab  Result Value Ref Range   SARS Coronavirus 2 by RT PCR NEGATIVE NEGATIVE   Influenza A by PCR NEGATIVE NEGATIVE   Influenza B by PCR NEGATIVE NEGATIVE    Comment: (NOTE) The Xpert Xpress SARS-CoV-2/FLU/RSV plus assay is intended as an aid in the diagnosis of influenza from Nasopharyngeal swab specimens and should not be used as a sole basis for treatment. Nasal washings and aspirates are unacceptable for Xpert Xpress SARS-CoV-2/FLU/RSV testing.  Fact Sheet for Patients: BloggerCourse.com  Fact Sheet for Healthcare Providers: SeriousBroker.it  This test is not yet approved or cleared by the [place] FDA and has been authorized for detection and/or diagnosis of SARS-CoV-2 by FDA under an Emergency Use Authorization (EUA). This EUA will remain in effect (meaning this test can be used) for the duration of the COVID-19 declaration under Section 564(b)(1) of the Act, 21 U.S.C. section 360bbb-3(b)(1), unless the authorization is terminated or revoked.     Resp Syncytial Virus by PCR NEGATIVE NEGATIVE    Comment: (NOTE) Fact Sheet for Patients: BloggerCourse.com  Fact Sheet for Healthcare Providers: SeriousBroker.it  This test is not yet approved or cleared by the [place] FDA and has been authorized for detection and/or diagnosis of SARS-CoV-2 by FDA under an Emergency Use Authorization (EUA). This EUA will remain in effect (meaning this test can be used) for the duration of the COVID-19 declaration under Section 564(b)(1) of the Act, 21 U.S.C. section 360bbb-3(b)(1), unless the  authorization is terminated or revoked.  Performed at [facility] Lab, [address] [place], [place] [address]   Lactic acid, plasma     Status: Abnormal   Collection Time: 04/01/23  8:51 AM  Result Value Ref Range   Lactic Acid, Venous 4.6 (HH) 0.5 - 1.9 mmol/L    Comment: CRITICAL RESULT CALLED TO, READ BACK BY AND VERIFIED WITH CHENEY, A. ,RN @ 09:58 07.02.24 JLASIGAN Performed at [facility], [address] [place], [place] [address]   Comprehensive metabolic panel     Status: Abnormal   Collection Time: 04/01/23  8:51 AM  Result Value Ref Range   Sodium 129 (L) 135 - 145 mmol/L   Potassium 5.3 (H) 3.5 - 5.1 mmol/L   Chloride 91 (L) 98 - 111 mmol/L   CO2 18 (L) 22 - 32 mmol/L   Glucose, Bld 136 (H) 70 - 99 mg/dL    Comment: Glucose reference range applies only to samples taken after fasting for at least 8 hours.   BUN 102 (H) 8 - 23 mg/dL   Creatinine, Ser 09.81 (H) 0.61 - 1.24  mg/dL   Calcium 9.8 8.9 - 09.8 mg/dL   Total Protein 7.4 6.5 - 8.1 g/dL   Albumin 2.8 (L) 3.5 - 5.0 g/dL   AST 119 (H) 15 - 41 U/L   ALT 56 (H) 0 - 44 U/L   Alkaline Phosphatase 47 38 - 126 U/L   Total Bilirubin 1.1 0.3 - 1.2 mg/dL   GFR, Estimated 3 (L) >60 mL/min    Comment: (NOTE) Calculated using the CKD-EPI Creatinine Equation (2021)    Anion gap 20 (H) 5 - 15    Comment: Performed at [facility], [address] [place], [place] [address]  CBC with Differential     Status: Abnormal   Collection Time: 04/01/23  8:51 AM  Result Value Ref Range   WBC 13.8 (H) 4.0 - 10.5 K/uL   RBC 4.11 (L) 4.22 - 5.81 MIL/uL   Hemoglobin 13.8 13.0 - 17.0 g/dL   HCT 95.6 21.3 - 08.6 %   MCV 98.8 80.0 - 100.0 fL   MCH 33.6 26.0 - 34.0 pg   MCHC 34.0 30.0 - 36.0 g/dL   RDW 57.8 46.9 - 62.9 %   Platelets 85 (L) 150 - 400 K/uL    Comment: Immature Platelet Fraction may be clinically indicated, consider ordering this additional test [id number] REPEATED TO VERIFY    nRBC 0.0 0.0 - 0.2 %    Neutrophils Relative % 88 %   Neutro Abs 12.1 (H) 1.7 - 7.7 K/uL   Lymphocytes Relative 4 %   Lymphs Abs 0.6 (L) 0.7 - 4.0 K/uL   Monocytes Relative 6 %   Monocytes Absolute 0.9 0.1 - 1.0 K/uL   Eosinophils Relative 0 %   Eosinophils Absolute 0.0 0.0 - 0.5 K/uL   Basophils Relative 0 %   Basophils Absolute 0.0 0.0 - 0.1 K/uL   WBC Morphology Mild Left Shift (1-5% metas, occ myelo)    RBC Morphology MORPHOLOGY UNREMARKABLE    Smear Review MORPHOLOGY UNREMARKABLE    Immature Granulocytes 2 %   Abs Immature Granulocytes 0.21 (H) 0.00 - 0.07 K/uL    Comment: Performed at [facility], [address] [place], [place] [address]  Protime-INR     Status: Abnormal   Collection Time: 04/01/23  8:51 AM  Result Value Ref Range   Prothrombin Time 19.0 (H) 11.4 - 15.2 seconds   INR 1.6 (H) 0.8 - 1.2    Comment: (NOTE) INR goal varies based on device and disease states. Performed at [facility], [address], [place], [place] [address]   APTT     Status: None   Collection Time: 04/01/23  8:51 AM  Result Value Ref Range   aPTT 33 24 - 36 seconds    Comment: Performed at [facility], [address] [place], [place] [address]  Blood Culture (routine x 2)     Status: Abnormal (Preliminary result)   Collection Time: 04/01/23  8:51 AM   Specimen: BLOOD LEFT FOREARM  Result Value Ref Range   Specimen Description BLOOD LEFT FOREARM    Special Requests      BOTTLES DRAWN AEROBIC AND ANAEROBIC Blood Culture results may not be optimal due to an excessive volume of blood received in culture bottles   Culture  Setup Time      GRAM POSITIVE COCCI IN CLUSTERS IN BOTH AEROBIC AND ANAEROBIC BOTTLES CRITICAL RESULT CALLED TO, READ BACK BY AND VERIFIED WITH: PHARMD HAILEY VONDOLEN ON 04/01/23 @  2017 BY DRT    Culture (A)     STAPHYLOCOCCUS AUREUS SUSCEPTIBILITIES TO FOLLOW Performed at [facility], [address] [place], [place] [address]    Report Status PENDING    Blood Culture ID Panel (Reflexed)     Status: Abnormal   Collection Time: 04/01/23  8:51 AM  Result Value Ref Range   Enterococcus faecalis NOT DETECTED NOT DETECTED   Enterococcus Faecium NOT DETECTED NOT DETECTED   Listeria monocytogenes NOT DETECTED NOT DETECTED   Staphylococcus species DETECTED (A) NOT DETECTED    Comment: CRITICAL RESULT CALLED TO, READ BACK BY AND VERIFIED WITH: PHARMD HAILEY VONDOLEN ON 04/01/23 @ 2017 BY DRT    Staphylococcus aureus (BCID) DETECTED (A) NOT DETECTED    Comment: CRITICAL RESULT CALLED TO, READ BACK BY AND VERIFIED WITH: PHARMD HAILEY VONDOLEN ON 04/01/23 @ 2017 BY DRT    Staphylococcus epidermidis NOT DETECTED NOT DETECTED   Staphylococcus lugdunensis NOT DETECTED NOT DETECTED   Streptococcus species NOT DETECTED NOT DETECTED   Streptococcus agalactiae NOT DETECTED NOT DETECTED   Streptococcus pneumoniae NOT DETECTED NOT DETECTED   Streptococcus pyogenes NOT DETECTED NOT DETECTED   A.calcoaceticus-baumannii NOT DETECTED NOT DETECTED   Bacteroides fragilis NOT DETECTED NOT DETECTED   Enterobacterales NOT DETECTED NOT DETECTED   Enterobacter cloacae complex NOT DETECTED NOT DETECTED   Escherichia coli NOT DETECTED NOT DETECTED   Klebsiella aerogenes NOT DETECTED NOT DETECTED   Klebsiella oxytoca NOT DETECTED NOT DETECTED   Klebsiella pneumoniae NOT DETECTED NOT DETECTED   Proteus species NOT DETECTED NOT DETECTED   Salmonella species NOT DETECTED NOT DETECTED   Serratia marcescens NOT DETECTED NOT DETECTED   Haemophilus influenzae NOT DETECTED NOT DETECTED   Neisseria meningitidis NOT DETECTED NOT DETECTED   Pseudomonas aeruginosa NOT DETECTED NOT DETECTED   Stenotrophomonas maltophilia NOT DETECTED NOT DETECTED   Candida albicans NOT DETECTED NOT DETECTED   Candida auris NOT DETECTED NOT DETECTED   Candida glabrata NOT DETECTED NOT DETECTED   Candida krusei NOT DETECTED NOT DETECTED   Candida parapsilosis NOT DETECTED NOT DETECTED   Candida  tropicalis NOT DETECTED NOT DETECTED   Cryptococcus neoformans/gattii NOT DETECTED NOT DETECTED   Meth resistant mecA/C and MREJ NOT DETECTED NOT DETECTED    Comment: Performed at [facility], [address] [place], [place] [address]  Blood Culture (routine x 2)     Status: None (Preliminary result)   Collection Time: 04/01/23  1:09 PM   Specimen: BLOOD LEFT HAND  Result Value Ref Range   Specimen Description BLOOD LEFT HAND    Special Requests      BOTTLES DRAWN AEROBIC AND ANAEROBIC Blood Culture adequate volume   Culture      NO GROWTH < 24 HOURS Performed at [facility], [address], [place], [place] [address]    Report Status PENDING   Comprehensive metabolic panel     Status: Abnormal   Collection Time: 04/01/23  2:47 PM  Result Value Ref Range   Sodium 127 (L) 135 - 145 mmol/L   Potassium 5.6 (H) 3.5 - 5.1 mmol/L   Chloride 93 (L) 98 - 111 mmol/L   CO2 14 (L) 22 - 32 mmol/L   Glucose, Bld 125 (H) 70 - 99 mg/dL    Comment: Glucose reference range applies only to samples taken after fasting for at least 8 hours.   BUN 104 (H) 8 - 23 mg/dL   Creatinine, Ser 91.47 (H) 0.61 -  1.24 mg/dL   Calcium 9.1 8.9 - 16.1 mg/dL   Total Protein 6.6 6.5 - 8.1 g/dL   Albumin 2.5 (L) 3.5 - 5.0 g/dL   AST 096 (H) 15 - 41 U/L   ALT 65 (H) 0 - 44 U/L   Alkaline Phosphatase 39 38 - 126 U/L   Total Bilirubin 1.0 0.3 - 1.2 mg/dL   GFR, Estimated 3 (L) >60 mL/min    Comment: (NOTE) Calculated using the CKD-EPI Creatinine Equation (2021)    Anion gap 20 (H) 5 - 15    Comment: Performed at [facility], [address], [place], [place] [address]  Hepatitis B surface antigen     Status: None   Collection Time: 04/01/23  2:47 PM  Result Value Ref Range   Hepatitis B Surface Ag NON REACTIVE NON REACTIVE    Comment: Performed at [facility], [address], [place], [place] [address]  Hepatitis B surface antibody,quantitative     Status: None   Collection Time:  04/01/23  2:47 PM  Result Value Ref Range   Hep B S AB Quant (Post) 10.3 Immunity>10 mIU/mL    Comment: (NOTE)  Status of Immunity                     Anti-HBs Level  ------------------                     -------------- Inconsistent with Immunity                  0.0 - 10.0 Consistent with Immunity                         >10.0 Performed At: [facility] [address] [place], [place] [id number] Jolene Schimke MD ZH:[id number]   Hepatitis panel, acute     Status: None   Collection Time: 04/01/23  2:47 PM  Result Value Ref Range   Hepatitis B Surface Ag NON REACTIVE NON REACTIVE   HCV Ab NON REACTIVE NON REACTIVE    Comment: (NOTE) Nonreactive HCV antibody screen is consistent with no HCV infections,  unless recent infection is suspected or other evidence exists to indicate HCV infection.     Hep A IgM NON REACTIVE NON REACTIVE   Hep B C IgM NON REACTIVE NON REACTIVE    Comment: Performed at [facility], [address] [place], [place] [address]  Lactic acid, plasma     Status: Abnormal   Collection Time: 04/01/23  2:48 PM  Result Value Ref Range   Lactic Acid, Venous 4.0 (HH) 0.5 - 1.9 mmol/L    Comment: CRITICAL VALUE NOTED. VALUE IS CONSISTENT WITH PREVIOUSLY REPORTED/CALLED VALUE Performed at [facility], [address], [place], [place] [address]   Lactic acid, plasma     Status: Abnormal   Collection Time: 04/01/23 11:15 PM  Result Value Ref Range   Lactic Acid, Venous 3.9 (HH) 0.5 - 1.9 mmol/L    Comment: CRITICAL VALUE NOTED. VALUE IS CONSISTENT WITH PREVIOUSLY REPORTED/CALLED VALUE Performed at [facility], [address], [place], [place] [address]   Culture, blood (Routine X 2) w Reflex to ID Panel     Status: None (Preliminary result)   Collection Time: 04/01/23 11:15 PM   Specimen: BLOOD  Result Value Ref Range   Specimen Description BLOOD BLOOD LEFT ARM    Special Requests  BOTTLES DRAWN AEROBIC AND ANAEROBIC Blood Culture  results may not be optimal due to an inadequate volume of blood received in culture bottles   Culture      NO GROWTH < 12 HOURS Performed at [facility], [address], [place], [place] [address]    Report Status PENDING   Culture, blood (Routine X 2) w Reflex to ID Panel     Status: None (Preliminary result)   Collection Time: 04/01/23 11:21 PM   Specimen: BLOOD  Result Value Ref Range   Specimen Description BLOOD BLOOD LEFT HAND    Special Requests      BOTTLES DRAWN AEROBIC AND ANAEROBIC Blood Culture results may not be optimal due to an inadequate volume of blood received in culture bottles   Culture      NO GROWTH < 12 HOURS Performed at [facility], [address] [place], [place] [address]    Report Status PENDING   CBC with Differential/Platelet     Status: Abnormal   Collection Time: 04/02/23  3:40 AM  Result Value Ref Range   WBC 9.0 4.0 - 10.5 K/uL   RBC 3.80 (L) 4.22 - 5.81 MIL/uL   Hemoglobin 12.9 (L) 13.0 - 17.0 g/dL   HCT 09.8 (L) 11.9 - 14.7 %   MCV 97.4 80.0 - 100.0 fL   MCH 33.9 26.0 - 34.0 pg   MCHC 34.9 30.0 - 36.0 g/dL   RDW 82.9 56.2 - 13.0 %   Platelets 77 (L) 150 - 400 K/uL    Comment: Immature Platelet Fraction may be clinically indicated, consider ordering this additional test [id number]    nRBC 0.0 0.0 - 0.2 %   Neutrophils Relative % 82 %   Neutro Abs 7.5 1.7 - 7.7 K/uL   Lymphocytes Relative 5 %   Lymphs Abs 0.5 (L) 0.7 - 4.0 K/uL   Monocytes Relative 9 %   Monocytes Absolute 0.8 0.1 - 1.0 K/uL   Eosinophils Relative 1 %   Eosinophils Absolute 0.1 0.0 - 0.5 K/uL   Basophils Relative 1 %   Basophils Absolute 0.1 0.0 - 0.1 K/uL   Immature Granulocytes 2 %   Abs Immature Granulocytes 0.16 (H) 0.00 - 0.07 K/uL    Comment: Performed at [facility], [address], [place], [place] [address]  Ammonia     Status: None   Collection Time: 04/02/23  3:40 AM  Result Value Ref Range   Ammonia 28 9 - 35 umol/L    Comment:  Performed at [facility], [address], [place], [place] [address]  MRSA Next Gen by PCR, Nasal     Status: None   Collection Time: 04/02/23  5:42 AM   Specimen: Nasal Mucosa; Nasal Swab  Result Value Ref Range   MRSA by PCR Next Gen NOT DETECTED NOT DETECTED    Comment: (NOTE) The GeneXpert MRSA Assay (FDA approved for NASAL specimens only), is one component of a comprehensive MRSA colonization surveillance program. It is not intended to diagnose MRSA infection nor to guide or monitor treatment for MRSA infections. Test performance is not FDA approved in patients less than 12 years old. Performed at [facility], [address] [place], [place] [address]   Type and screen     Status: None   Collection Time: 04/02/23  5:51 AM  Result Value Ref Range   ABO/RH(D) O POS    Antibody Screen NEG    Sample  Expiration      04/05/2023,2359 Performed at [facility], [address], [place], [place] [address]   Renal function panel     Status: Abnormal   Collection Time: 04/02/23  5:55 AM  Result Value Ref Range   Sodium 131 (L) 135 - 145 mmol/L   Potassium 5.2 (H) 3.5 - 5.1 mmol/L   Chloride 90 (L) 98 - 111 mmol/L   CO2 21 (L) 22 - 32 mmol/L   Glucose, Bld 117 (H) 70 - 99 mg/dL    Comment: Glucose reference range applies only to samples taken after fasting for at least 8 hours.   BUN 58 (H) 8 - 23 mg/dL   Creatinine, Ser 6.04 (H) 0.61 - 1.24 mg/dL   Calcium 9.1 8.9 - 54.0 mg/dL   Phosphorus 4.7 (H) 2.5 - 4.6 mg/dL   Albumin 2.3 (L) 3.5 - 5.0 g/dL   GFR, Estimated 7 (L) >60 mL/min    Comment: (NOTE) Calculated using the CKD-EPI Creatinine Equation (2021)    Anion gap 20 (H) 5 - 15    Comment: Performed at [facility], [address], [place], [place] [address]  Procalcitonin     Status: None   Collection Time: 04/02/23  5:56 AM  Result Value Ref Range   Procalcitonin 37.09 ng/mL    Comment:        Interpretation: PCT >= 10 ng/mL: Important  systemic inflammatory response, almost exclusively due to severe bacterial sepsis or septic shock. (NOTE)       Sepsis PCT Algorithm           Lower Respiratory Tract                                      Infection PCT Algorithm    ----------------------------     ----------------------------         PCT < 0.25 ng/mL                PCT < 0.10 ng/mL          Strongly encourage             Strongly discourage   discontinuation of antibiotics    initiation of antibiotics    ----------------------------     -----------------------------       PCT 0.25 - 0.50 ng/mL            PCT 0.10 - 0.25 ng/mL               OR       >80% decrease in PCT            Discourage initiation of                                            antibiotics      Encourage discontinuation           of antibiotics    ----------------------------     -----------------------------         PCT >= 0.50 ng/mL              PCT 0.26 - 0.50 ng/mL                AND       <80% decrease in PCT  Encourage initiation of                                             antibiotics       Encourage continuation           of antibiotics    ----------------------------     -----------------------------        PCT >= 0.50 ng/mL                  PCT > 0.50 ng/mL               AND         increase in PCT                  Strongly encourage                                      initiation of antibiotics    Strongly encourage escalation           of antibiotics                                     -----------------------------                                           PCT <= 0.25 ng/mL                                                 OR                                        > 80% decrease in PCT                                      Discontinue / Do not initiate                                             antibiotics  Performed at [facility], [address], [place], [place] [address]   C-reactive protein     Status:  Abnormal   Collection Time: 04/02/23  5:56 AM  Result Value Ref Range   CRP 43.0 (H) <1.0 mg/dL    Comment: Performed at [facility], [address], [place], [place] [address]    CT ABDOMEN PELVIS W CONTRAST  Result Date: 04/02/2023 CLINICAL DATA:  Abdominal pain, acute, nonlocalized. End stage renal disease. Nausea and vomiting. Sepsis. EXAM: CT ABDOMEN AND PELVIS WITH CONTRAST TECHNIQUE: Multidetector CT imaging of the abdomen and pelvis was performed using the standard protocol following bolus administration of intravenous contrast. RADIATION DOSE REDUCTION: This exam was performed according  to the departmental dose-optimization program which includes automated exposure control, adjustment of the mA and/or kV according to patient size and/or use of iterative reconstruction technique. CONTRAST:  75mL OMNIPAQUE IOHEXOL 350 MG/ML SOLN COMPARISON:  04/05/2009 FINDINGS: Lower chest: Mild dependent atelectasis at the lung bases. Tiny amount of pleural fluid. Non dependent lung as well aerated. There is cardiomegaly and coronary artery calcification. Hepatobiliary: No liver parenchymal abnormality. No calcified gallstones. No ductal dilatation. Pancreas: Normal Spleen: Normal Adrenals/Urinary Tract: Adrenal glands are normal. Atrophic kidneys, more so on the left than the right. No hydroureteronephrosis. No stone or mass. Bladder is not well seen because of artifact from a left hip replacement. No bladder distension. Stomach/Bowel: Stomach and small intestine are normal. Normal appendix. No abnormal colon finding. Diverticulosis without evidence of diverticulitis. Vascular/Lymphatic: Aorta and IVC are normal.  No adenopathy. Reproductive: Normal Other: No free fluid or air. Musculoskeletal: Ordinary mild degenerative change affects the spine. IMPRESSION: 1. No acute finding by CT. 2. Atrophic kidneys, more so on the left than the right. 3. Diverticulosis without evidence of diverticulitis. 4.  Cardiomegaly and coronary artery calcification. 5. Mild dependent atelectasis at the lung bases. Tiny amount of pleural fluid. Electronically Signed   By: Paulina Fusi M.D.   On: 04/02/2023 13:01   VAS [place] LOWER EXTREMITY VENOUS (DVT)  Result Date: 04/02/2023  Lower Venous DVT Study Patient Name:  Peter [name]  Date of Exam:   04/02/2023 Medical Rec #: [id number]          Accession #:    [id number] Date of Birth: [birth year]          Patient Gender: M Patient Age:   50 years Exam Location:  [facility] Procedure:      VAS [place] LOWER EXTREMITY VENOUS (DVT) Referring Phys: Kathlen Mody --------------------------------------------------------------------------------  Indications: Swelling.  Limitations: Body habitus and poor ultrasound/tissue interface. Comparison Study: Previous 01/09/18 negative. Performing Technologist: McKayla Maag RVT, [place]  Examination Guidelines: A complete evaluation includes B-mode imaging, spectral Doppler, color Doppler, and power Doppler as needed of all accessible portions of each vessel. Bilateral testing is considered an integral part of a complete examination. Limited examinations for reoccurring indications may be performed as noted. The reflux portion of the exam is performed with the patient in reverse Trendelenburg.  +-----+---------------+---------+-----------+----------+--------------+ RIGHTCompressibilityPhasicitySpontaneityPropertiesThrombus Aging +-----+---------------+---------+-----------+----------+--------------+ CFV  Full           Yes      Yes                                 +-----+---------------+---------+-----------+----------+--------------+ SFJ  Full                                                        +-----+---------------+---------+-----------+----------+--------------+   +---------+---------------+---------+-----------+----------+-------------------+ LEFT     CompressibilityPhasicitySpontaneityPropertiesThrombus Aging       +---------+---------------+---------+-----------+----------+-------------------+ CFV      Full           Yes      Yes                                      +---------+---------------+---------+-----------+----------+-------------------+ SFJ      Full                                                             +---------+---------------+---------+-----------+----------+-------------------+  FV Prox  Full                                                             +---------+---------------+---------+-----------+----------+-------------------+ FV Mid   Full                                                             +---------+---------------+---------+-----------+----------+-------------------+ FV DistalFull                                                             +---------+---------------+---------+-----------+----------+-------------------+ PFV      Full                                                             +---------+---------------+---------+-----------+----------+-------------------+ POP      Full           Yes      Yes                                      +---------+---------------+---------+-----------+----------+-------------------+ PTV      Full                                                             +---------+---------------+---------+-----------+----------+-------------------+ PERO                                                  Not well visualized +---------+---------------+---------+-----------+----------+-------------------+    Summary: RIGHT: - No evidence of common femoral vein obstruction.  LEFT: - There is no evidence of deep vein thrombosis in the lower extremity. However, portions of this examination were limited- see technologist comments above.  - No cystic structure found in the popliteal fossa.  *See table(s) above for measurements and observations.    Preliminary    DG Abd Portable 1V  Result Date:  04/02/2023 CLINICAL DATA:  Nausea and vomiting EXAM: PORTABLE ABDOMEN - 1 VIEW COMPARISON:  Abdominal CT 04/05/2009 FINDINGS: Normal bowel gas pattern. Stool at the rectum without generalized abnormal stool retention. No concerning mass effect or calcification. Cardiomegaly with clear lung bases. Generalized spondylosis. Unremarkable left hip replacement. IMPRESSION: Normal bowel gas pattern. Electronically Signed   By: Tiburcio Pea M.D.   On: 04/02/2023 06:59   DG Chest Port 1 View  Result Date: 04/02/2023 CLINICAL DATA:  Shortness of breath EXAM: PORTABLE CHEST 1 VIEW COMPARISON:  04/01/2023 FINDINGS: Chronic cardiomegaly. Stable mediastinal contours. Low volume but clear lungs with no effusion or pneumothorax. Artifact from EKG leads. IMPRESSION: Stable low volume chest with cardiomegaly. Electronically Signed   By: Tiburcio Pea M.D.   On: 04/02/2023 06:58   DG Shoulder Left  Result Date: 04/01/2023 CLINICAL DATA:  Pain after fall EXAM: LEFT SHOULDER - 2 VIEW COMPARISON:  None Available. FINDINGS: Osteopenia. Osteophyte formation of the glenohumeral joint and AC joint with some joint space loss. There is also elevation of the humeral head relative to the acromion. Please correlate for a full-thickness rotator cuff tear. No acute fracture or dislocation. Surgical clips in the left axillary region. IMPRESSION: Moderate degenerative changes. High-riding humeral head. Please correlate for a full-thickness rotator cuff tear Electronically Signed   By: Karen Kays M.D.   On: 04/01/2023 11:04   DG Knee Complete 4 Views Right  Result Date: 04/01/2023 CLINICAL DATA:  Pain after fall EXAM: RIGHT KNEE - COMPLETE 4 VIEW COMPARISON:  None Available. FINDINGS: Moderate joint space loss of the medial compartment. Small osteophytes seen of all 3 compartments greatest of the patellofemoral joint. There is a moderate joint effusion. Hyperostosis. Osteopenia. No fracture or dislocation. IMPRESSION: Tricompartment  degenerative changes identified, overall moderate greatest involving the medial compartment and patellofemoral joint. Moderate joint effusion of uncertain etiology. This has a broad differential. This could be related to the degenerative changes. However please correlate for history of trauma or infection. Electronically Signed   By: Karen Kays M.D.   On: 04/01/2023 11:03   DG Knee Complete 4 Views Left  Result Date: 04/01/2023 CLINICAL DATA:  Pain after fall.  Weakness for 2 days EXAM: LEFT KNEE - COMPLETE 4 VIEW COMPARISON:  None Available. FINDINGS: Osteopenia. Slight joint space loss of the medial compartment. Small osteophytes of all 3 compartments. No joint effusion on lateral view. No fracture or dislocation. Hyperostosis of the patella. Vascular calcifications. IMPRESSION: Mild degenerative changes.  No joint effusion. Electronically Signed   By: Karen Kays M.D.   On: 04/01/2023 11:00   CT Head Wo Contrast  Result Date: 04/01/2023 CLINICAL DATA:  Provided history: Polytrauma, blunt. EXAM: CT HEAD WITHOUT CONTRAST CT CERVICAL SPINE WITHOUT CONTRAST TECHNIQUE: Multidetector CT imaging of the head and cervical spine was performed following the standard protocol without intravenous contrast. Multiplanar CT image reconstructions of the cervical spine were also generated. RADIATION DOSE REDUCTION: This exam was performed according to the departmental dose-optimization program which includes automated exposure control, adjustment of the mA and/or kV according to patient size and/or use of iterative reconstruction technique. COMPARISON:  None. FINDINGS: CT HEAD FINDINGS Brain: Mild generalized parenchymal atrophy. There is no acute intracranial hemorrhage. No demarcated cortical infarct. No extra-axial fluid collection. No evidence of an intracranial mass. No midline shift. Vascular: No hyperdense vessel.  Atherosclerotic calcifications. Skull: No calvarial fracture or aggressive osseous lesion.  Sinuses/Orbits: No mass or acute finding within the imaged orbits. Mild-to-moderate mucosal thickening, and small volume frothy secretions, within the right maxillary sinus. Small mucous retention cyst within the left maxillary sinus. CT CERVICAL SPINE FINDINGS Alignment: No significant spondylolisthesis. Skull base and vertebrae: The basion-dental and atlanto-dental intervals are maintained.No evidence of acute fracture to the cervical spine. Soft tissues and spinal canal: No prevertebral fluid or swelling. No visible canal hematoma. Disc levels: Cervical spondylosis with multilevel disc space narrowing, disc bulges/central disc protrusions, endplate spurring, uncovertebral hypertrophy and facet arthrosis. Disc space narrowing  is greatest at C5-C6 (moderate/advanced), C7-T1 (moderate/advanced), T1-T2 (advanced) and T2-T3 (advanced). 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 Most notably at C2-C3 and C3-C4, central disc protrusions contribute to at least moderate spinal canal stenosis. Multilevel bony neural foraminal narrowing. Upper chest: No consolidation within the imaged lung apices. No visible pneumothorax. IMPRESSION: CT head: 1.  No evidence of an acute intracranial abnormality. 2. Mild generalized parenchymal atrophy. 3. Paranasal sinus disease as described. CT cervical spine: 1. No evidence of an acute cervical spine fracture. 2. Cervical spondylosis as outlined. Notably at C2-C3 and C3-C4, central disc protrusions contribute to at least moderate spinal canal stenosis. Multilevel bony neural from narrowing. Electronically Signed   By: Jackey Loge D.O.   On: 04/01/2023 10:32   CT Cervical Spine Wo Contrast  Result Date: 04/01/2023 CLINICAL DATA:  Provided history: Polytrauma, blunt. EXAM: CT HEAD WITHOUT CONTRAST CT CERVICAL SPINE WITHOUT CONTRAST TECHNIQUE: Multidetector CT imaging of the head and cervical spine was performed following the  standard protocol without intravenous contrast. Multiplanar CT image reconstructions of the cervical spine were also generated. RADIATION DOSE REDUCTION: This exam was performed according to the departmental dose-optimization program which includes automated exposure control, adjustment of the mA and/or kV according to patient size and/or use of iterative reconstruction technique. COMPARISON:  None. FINDINGS: CT HEAD FINDINGS Brain: Mild generalized parenchymal atrophy. There is no acute intracranial hemorrhage. No demarcated cortical infarct. No extra-axial fluid collection. No evidence of an intracranial mass. No midline shift. Vascular: No hyperdense vessel.  Atherosclerotic calcifications. Skull: No calvarial fracture or aggressive osseous lesion. Sinuses/Orbits: No mass or acute finding within the imaged orbits. Mild-to-moderate mucosal thickening, and small volume frothy secretions, within the right maxillary sinus. Small mucous retention cyst within the left maxillary sinus. CT CERVICAL SPINE FINDINGS Alignment: No significant spondylolisthesis. Skull base and vertebrae: The basion-dental and atlanto-dental intervals are maintained.No evidence of acute fracture to the cervical spine. Soft tissues and spinal canal: No prevertebral fluid or swelling. No visible canal hematoma. Disc levels: Cervical spondylosis with multilevel disc space narrowing, disc bulges/central disc protrusions, endplate spurring, uncovertebral hypertrophy and facet arthrosis. Disc space narrowing is greatest at C5-C6 (moderate/advanced), C7-T1 (moderate/advanced), T1-T2 (advanced) and T2-T3 (advanced). 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 Most notably at C2-C3 and C3-C4, central disc protrusions contribute to at least moderate spinal canal stenosis. Multilevel bony neural foraminal narrowing. Upper chest: No consolidation within the imaged lung apices. No visible pneumothorax.  IMPRESSION: CT head: 1.  No evidence of an acute intracranial abnormality. 2. Mild generalized parenchymal atrophy. 3. Paranasal sinus disease as described. CT cervical spine: 1. No evidence of an acute cervical spine fracture. 2. Cervical spondylosis as outlined. Notably at C2-C3 and C3-C4, central disc protrusions contribute to at least moderate spinal canal stenosis. Multilevel bony neural from narrowing. Electronically Signed   By: Jackey Loge D.O.   On: 04/01/2023 10:32   DG Chest Portable 1 View  Result Date: 04/01/2023 CLINICAL DATA:  Shortness of breath EXAM: PORTABLE CHEST 1 VIEW COMPARISON:  Chest radiograph dated 06/09/2018 FINDINGS: The medial apices are obscured due to overlying mandible. Low lung volumes with bronchovascular crowding. Medial right lower lung patchy opacities. No pleural effusion or pneumothorax. Similar enlarged cardiomediastinal silhouette. No acute osseous abnormality. IMPRESSION: 1. Medial right lower lung patchy opacities, which may represent atelectasis, aspiration, or pneumonia. 2. Similar cardiomegaly. Electronically Signed   By: Milus Height.D.  On: 04/01/2023 08:50    Review of Systems  Unable to perform ROS: Acuity of condition  Musculoskeletal:  Positive for arthralgias (Right knee).   Blood pressure 128/70, pulse (!) 126, temperature 98.6 F (37 C), temperature source Oral, resp. rate 20, height 5\' 6"  (1.676 m), weight 97.6 kg, SpO2 93 %. Physical Exam Constitutional:      General: He is not in acute distress.    Appearance: He is well-developed. He is not diaphoretic.  HENT:     Head: Normocephalic and atraumatic.  Eyes:     General: No scleral icterus.       Right eye: No discharge.        Left eye: No discharge.     Conjunctiva/sclera: Conjunctivae normal.  Cardiovascular:     Rate and Rhythm: Normal rate and regular rhythm.  Pulmonary:     Effort: Pulmonary effort is normal. No respiratory distress.  Musculoskeletal:     Cervical back: Normal  range of motion.     Comments: RLE No traumatic wounds or rash, ant knee ecchymotic  Mod TTP knee, painless PROM from 85-170 degrees  Mod knee effusion  Knee stable to varus/ valgus and anterior/posterior stress  Sens DPN, SPN, TN intact  Motor EHL, ext, flex, evers 5/5  DP 2+, PT 1+, No significant edema  Skin:    General: Skin is warm and dry.  Neurological:     Mental Status: He is alert.  Psychiatric:        Mood and Affect: Mood normal.        Behavior: Behavior normal.    Assessment/Plan: Right knee pain -- Will aspirate later today.    Freeman Caldron, PA-C Orthopedic Surgery [phone] 04/02/2023, 2:23 PM

## 2023-04-02 NOTE — Progress Notes (Signed)
   04/02/23 1850  Vitals  Temp 98.7 F (37.1 C)  Temp Source Oral  BP 104/68  BP Location Right Leg  BP Method Automatic  Patient Position (if appropriate) Lying  Pulse Rate 62  Resp 20  Oxygen Therapy  SpO2 98 %  O2 Device Room Air  During Treatment Monitoring  Intra-Hemodialysis Comments Tx completed  Post Treatment  Dialyzer Clearance Lightly streaked  Duration of HD Treatment -hour(s) 3.5 hour(s)  Hemodialysis Intake (mL) 0 mL  Liters Processed 82  Fluid Removed (mL) 1400 mL  Tolerated HD Treatment Yes  Post-Hemodialysis Comments Pt goal met.  AVG/AVF Arterial Site Held (minutes) 10 minutes  AVG/AVF Venous Site Held (minutes) 10 minutes

## 2023-04-02 NOTE — Progress Notes (Signed)
Initial Nutrition Assessment  DOCUMENTATION CODES:   Not applicable  INTERVENTION:  Continue current diet as ordered by MD Prosource Plus BID as ordered per MD. This provides 100kcal and 15g of protein per packet Renavite daily for increased micronutrient needs  NUTRITION DIAGNOSIS:   Increased nutrient needs related to chronic illness (ESRD on HD) as evidenced by estimated needs.  GOAL:   Patient will meet greater than or equal to 90% of their needs  MONITOR:   PO intake, Labs  REASON FOR ASSESSMENT:   Consult Assessment of nutrition requirement/status  ASSESSMENT:   Pt with hx of HLD, HTN, ESRD on HD, and DM type 2 presented to ED with weakness with nausea and vomiting. Imaging in ED worrisome for aspiration pneumonia.  Pt with ID team at first attempted visit and out of room for HD at second attempt. Will follow-up for physical exam and nutrition interview.    Reviewed chart and noted that no intake recorded. Prosource in place ordered by provider. Will monitor intake trends to determine if additional supplements needed to augment intake.  Weight appears stable in hx with some fluctuations likely related to fluid status. EDW from outpatient HD listed as 97.5 kg.   Nutritionally Relevant Medications: Scheduled Meds:  PROSource Plus  30 mL BID BM   atorvastatin  10 mg QHS   cinacalcet  180 mg Q breakfast   ferric citrate  630 mg TID WC   pantoprazole (PROTONIX) IV  40 mg Q12H   sodium zirconium cyclosilicate  10 g Once   Continuous Infusions:   ceFAZolin (ANCEF) IV Stopped (04/01/23 2323)   Labs Reviewed: Na 131, chloride 90 K 5.2 BUN 58, creatinine 7.96 Phosphorus 4.7  NUTRITION - FOCUSED PHYSICAL EXAM: Defer to follow-up assessment  Diet Order:   Diet Order             DIET SOFT Fluid consistency: Thin  Diet effective now                   EDUCATION NEEDS:   No education needs have been identified at this time  Skin:  Skin Assessment:  Reviewed RN Assessment  Last BM:  unsure  Height:   Ht Readings from Last 1 Encounters:  04/01/23 5\' 6"  (1.676 m)    Weight:   Wt Readings from Last 1 Encounters:  04/01/23 97.6 kg    Ideal Body Weight:  64.5 kg  BMI:  Body mass index is 34.73 kg/m.  Estimated Nutritional Needs:   Kcal:  1800-2000 kcal/d  Protein:  90-110 g/d  Fluid:  1L+UOP    Greig Castilla, RD, LDN Clinical Dietitian RD pager # available in [facility]  After hours/weekend pager # available in [facility]

## 2023-04-02 NOTE — Progress Notes (Addendum)
PROGRESS NOTE                                                                                                                                                                                                             Patient Demographics:    Peter [name], is a 72 y.o. male, DOB - [birth year], OZH:[id number]  Outpatient Primary MD for the patient is [name], MD    LOS - 1  Admit date - 2023/04/22    Chief Complaint  Patient presents with   Fever   Emesis       Brief Narrative (HPI from H&P)   72 y.o. male with medical history significant of ESRD on HD MWF, Type 2 DM  diet controlled,  anemia of CKD, hypertension, paroxysmal atrial fibrillation on Eliquis, hyperlipidemia, was brought in for generalized weakness, apparently patient was doing fine up till 2 days ago, sometime on Sunday he started developing nausea vomiting generalized weakness, had a fall, low-grade fever, subsequently brought to the ER where he was found to be septic and admitted to the hospital.   Subjective:    Peter [name] today has, No headache, No chest pain, No abdominal pain - No Nausea, No new weakness tingling or numbness, mild nausea, no cough or shortness of breath.   Assessment  & Plan :    Sepsis present on admission with Staph aureus bacteremia 1 out of 2, nausea vomiting and syncope at home.  No clear source of infection.  ID to see the patient shortly, source of infection not clear, for now cover with Ancef, obtain CT scan abdomen pelvis as he had nausea vomiting preceding his symptoms, will request [facility] and ID to see the patient as well.  Get echocardiogram ? Subtle Endocarditis, continue to monitor closely still tenuous.  Aspiration pneumonia.  Cover with Unasyn and follow cultures.  ESRD.  On MWF schedule.  Nephrology on board.  Right arm AV graft.  Fall without syncope.  Has soft tissue injuries.  MRI of the left shoulder to be  checked as he had some left shoulder discomfort, no erythema or warmth on exam, no pain at rest.  Monitor.  GERD.  IV PPI.  Paroxysmal atrial fibrillation [place] vas 2 score of 3.  Switch Eliquis to heparin till clinical situation stabilizes, placed on low-dose Cardizem p.o. and as  needed IV.  Right knee pain, post fall at home..  Improving.  X-ray nonacute.  Continue to monitor.  At risk for developing hemarthrosis. Ortho input.  Toxic encephalopathy due to sepsis.  Improving.  Mild asymptomatic transaminitis.  Check acute hepatitis panel and trend.  CT abdomen pelvis ordered and pending.  Bilirubin stable.       Condition - Extremely Guarded  Family Communication  : Family friend, POA at bedside on 04/02/2023  Code Status : Full code  Consults  : Nephrology, GI, ID, Ortho, palliative care  PUD Prophylaxis :  PPI   Procedures  :     CT scan head and C-spine.  1.  No evidence of an acute intracranial abnormality. 2. Mild generalized parenchymal atrophy. 3. Paranasal sinus disease as described. CT cervical spine: 1. No evidence of an acute cervical spine fracture. 2. Cervical spondylosis as outlined. Notably at C2-C3 and C3-C4, central disc protrusions contribute to at least moderate spinal canal stenosis. Multilevel bony neural from narrowing.   Lower extremity bilateral venous duplex.  No DVT.     MRI left shoulder.    Echocardiogram.    CT abdomen pelvis -       Disposition Plan  :    Status is: Inpatient   DVT Prophylaxis  :  Hep gtt >> Eliquis  Place and maintain sequential compression device Start: 04/02/23 1116    Lab Results  Component Value Date   PLT 77 (L) 04/02/2023    Diet :  Diet Order             Diet renal with fluid restriction Fluid restriction: 1200 mL Fluid; Room service appropriate? Yes; Fluid consistency: Thin  Diet effective now                    Inpatient Medications  Scheduled Meds:  (feeding supplement) PROSource Plus  30 mL  Oral BID BM   atorvastatin  10 mg Oral QHS   Chlorhexidine Gluconate Cloth  6 each Topical Daily   cinacalcet  180 mg Oral Q breakfast   ferric citrate  630 mg Oral TID WC   pantoprazole (PROTONIX) IV  40 mg Intravenous Q12H   Continuous Infusions:   ceFAZolin (ANCEF) IV Stopped (04/01/23 2323)   PRN Meds:.acetaminophen, lidocaine (PF), mouth rinse  Antibiotics  :    Anti-infectives (From admission, onward)    Start     Dose/Rate Route Frequency Ordered Stop   04/01/23 2200  ceFAZolin (ANCEF) IVPB 1 g/50 mL premix        1 g 100 mL/hr over 30 Minutes Intravenous Every 24 hours 04/01/23 2039     04/01/23 0830  cefTRIAXone (ROCEPHIN) 2 g in sodium chloride 0.9 % 100 mL IVPB  Status:  Discontinued        2 g 200 mL/hr over 30 Minutes Intravenous Every 24 hours 04/01/23 0823 04/01/23 2039   04/01/23 0830  azithromycin (ZITHROMAX) 500 mg in sodium chloride 0.9 % 250 mL IVPB  Status:  Discontinued        500 mg 250 mL/hr over 60 Minutes Intravenous Every 24 hours 04/01/23 0823 04/01/23 2039         Objective:   Vitals:   04/01/23 2031 04/02/23 0000 04/02/23 0021 04/02/23 0418  BP: (!) 135/41 (!) 112/52  103/80  Pulse: 97 60 (!) 53 88  Resp: (!) 25 (!) 27 20 20   Temp:  97.6 F (36.4 C)  (!) 100.4 F (38 C)  TempSrc:  Oral  Oral  SpO2: 96% 92% 92% 91%  Weight: 97.6 kg     Height:        Wt Readings from Last 3 Encounters:  04/01/23 97.6 kg  03/20/23 99.4 kg  02/26/23 96.8 kg     Intake/Output Summary (Last 24 hours) at 04/02/2023 1117 Last data filed at 04/02/2023 0400 Gross per 24 hour  Intake 50 ml  Output 1500 ml  Net -1450 ml     Physical Exam  Awake Alert but appears lethargic, No new F.N deficits, Normal affect [place].AT,PERRAL Supple Neck, No JVD,   Symmetrical Chest wall movement, Good air movement bilaterally, CTAB RRR,No Gallops,Rubs or new Murmurs,  +ve B.Sounds, Abd Soft, No tenderness,   No Cyanosis, Clubbing or edema      Data Review:     Recent Labs  Lab 04/01/23 0851 04/02/23 0340  WBC 13.8* 9.0  HGB 13.8 12.9*  HCT 40.6 37.0*  PLT 85* 77*  MCV 98.8 97.4  MCH 33.6 33.9  MCHC 34.0 34.9  RDW 14.2 14.4  LYMPHSABS 0.6* 0.5*  MONOABS 0.9 0.8  EOSABS 0.0 0.1  BASOSABS 0.0 0.1    Recent Labs  Lab 04/01/23 0851 04/01/23 1447 04/01/23 1448 04/01/23 2315 04/02/23 0340 04/02/23 0555 04/02/23 0556  NA 129* 127*  --   --   --  131*  --   K 5.3* 5.6*  --   --   --  5.2*  --   CL 91* 93*  --   --   --  90*  --   CO2 18* 14*  --   --   --  21*  --   ANIONGAP 20* 20*  --   --   --  20*  --   GLUCOSE 136* 125*  --   --   --  117*  --   BUN 102* 104*  --   --   --  58*  --   CREATININE 13.88* 13.58*  --   --   --  7.96*  --   AST 147* 172*  --   --   --   --   --   ALT 56* 65*  --   --   --   --   --   ALKPHOS 47 39  --   --   --   --   --   BILITOT 1.1 1.0  --   --   --   --   --   ALBUMIN 2.8* 2.5*  --   --   --  2.3*  --   CRP  --   --   --   --   --   --  43.0*  PROCALCITON  --   --   --   --   --   --  37.09  LATICACIDVEN 4.6*  --  4.0* 3.9*  --   --   --   INR 1.6*  --   --   --   --   --   --   AMMONIA  --   --   --   --  28  --   --   CALCIUM 9.8 9.1  --   --   --  9.1  --       Recent Labs  Lab 04/01/23 0851 04/01/23 1447 04/01/23 1448 04/01/23 2315 04/02/23 0340 04/02/23 0555 04/02/23 0556  CRP  --   --   --   --   --   --  43.0*  PROCALCITON  --   --   --   --   --   --  37.09  LATICACIDVEN 4.6*  --  4.0* 3.9*  --   --   --   INR 1.6*  --   --   --   --   --   --   AMMONIA  --   --   --   --  28  --   --   CALCIUM 9.8 9.1  --   --   --  9.1  --      Micro Results Recent Results (from the past 240 hour(s))  Resp panel by RT-PCR (RSV, Flu A&B, Covid) Anterior Nasal Swab     Status: None   Collection Time: 04/01/23  8:23 AM   Specimen: Anterior Nasal Swab  Result Value Ref Range Status   SARS Coronavirus 2 by RT PCR NEGATIVE NEGATIVE Final   Influenza A by PCR NEGATIVE NEGATIVE  Final   Influenza B by PCR NEGATIVE NEGATIVE Final    Comment: (NOTE) The Xpert Xpress SARS-CoV-2/FLU/RSV plus assay is intended as an aid in the diagnosis of influenza from Nasopharyngeal swab specimens and should not be used as a sole basis for treatment. Nasal washings and aspirates are unacceptable for Xpert Xpress SARS-CoV-2/FLU/RSV testing.  Fact Sheet for Patients: BloggerCourse.com  Fact Sheet for Healthcare Providers: SeriousBroker.it  This test is not yet approved or cleared by the [place] FDA and has been authorized for detection and/or diagnosis of SARS-CoV-2 by FDA under an Emergency Use Authorization (EUA). This EUA will remain in effect (meaning this test can be used) for the duration of the COVID-19 declaration under Section 564(b)(1) of the Act, 21 U.S.C. section 360bbb-3(b)(1), unless the authorization is terminated or revoked.     Resp Syncytial Virus by PCR NEGATIVE NEGATIVE Final    Comment: (NOTE) Fact Sheet for Patients: BloggerCourse.com  Fact Sheet for Healthcare Providers: SeriousBroker.it  This test is not yet approved or cleared by the [place] FDA and has been authorized for detection and/or diagnosis of SARS-CoV-2 by FDA under an Emergency Use Authorization (EUA). This EUA will remain in effect (meaning this test can be used) for the duration of the COVID-19 declaration under Section 564(b)(1) of the Act, 21 U.S.C. section 360bbb-3(b)(1), unless the authorization is terminated or revoked.  Performed at [facility], [address] [place], [place] [address]   Blood Culture (routine x 2)     Status: Abnormal (Preliminary result)   Collection Time: 04/01/23  8:51 AM   Specimen: BLOOD LEFT FOREARM  Result Value Ref Range Status   Specimen Description BLOOD LEFT FOREARM  Final   Special Requests   Final    BOTTLES DRAWN AEROBIC  AND ANAEROBIC Blood Culture results may not be optimal due to an excessive volume of blood received in culture bottles   Culture  Setup Time   Final    GRAM POSITIVE COCCI IN CLUSTERS IN BOTH AEROBIC AND ANAEROBIC BOTTLES CRITICAL RESULT CALLED TO, READ BACK BY AND VERIFIED WITH: PHARMD HAILEY VONDOLEN ON 04/01/23 @ 2017 BY DRT    Culture (A)  Final    STAPHYLOCOCCUS AUREUS SUSCEPTIBILITIES TO FOLLOW Performed at [facility], [address] [place], [place] [address]    Report Status PENDING  Incomplete  Blood Culture ID Panel (Reflexed)     Status: Abnormal   Collection Time: 04/01/23  8:51 AM  Result Value Ref Range Status  Enterococcus faecalis NOT DETECTED NOT DETECTED Final   Enterococcus Faecium NOT DETECTED NOT DETECTED Final   Listeria monocytogenes NOT DETECTED NOT DETECTED Final   Staphylococcus species DETECTED (A) NOT DETECTED Final    Comment: CRITICAL RESULT CALLED TO, READ BACK BY AND VERIFIED WITH: PHARMD HAILEY VONDOLEN ON Apr 28, 2023 @ 2017 BY DRT    Staphylococcus aureus (BCID) DETECTED (A) NOT DETECTED Final    Comment: CRITICAL RESULT CALLED TO, READ BACK BY AND VERIFIED WITH: PHARMD HAILEY VONDOLEN ON 04-28-23 @ 2017 BY DRT    Staphylococcus epidermidis NOT DETECTED NOT DETECTED Final   Staphylococcus lugdunensis NOT DETECTED NOT DETECTED Final   Streptococcus species NOT DETECTED NOT DETECTED Final   Streptococcus agalactiae NOT DETECTED NOT DETECTED Final   Streptococcus pneumoniae NOT DETECTED NOT DETECTED Final   Streptococcus pyogenes NOT DETECTED NOT DETECTED Final   A.calcoaceticus-baumannii NOT DETECTED NOT DETECTED Final   Bacteroides fragilis NOT DETECTED NOT DETECTED Final   Enterobacterales NOT DETECTED NOT DETECTED Final   Enterobacter cloacae complex NOT DETECTED NOT DETECTED Final   Escherichia coli NOT DETECTED NOT DETECTED Final   Klebsiella aerogenes NOT DETECTED NOT DETECTED Final   Klebsiella oxytoca NOT DETECTED NOT DETECTED Final    Klebsiella pneumoniae NOT DETECTED NOT DETECTED Final   Proteus species NOT DETECTED NOT DETECTED Final   Salmonella species NOT DETECTED NOT DETECTED Final   Serratia marcescens NOT DETECTED NOT DETECTED Final   Haemophilus influenzae NOT DETECTED NOT DETECTED Final   Neisseria meningitidis NOT DETECTED NOT DETECTED Final   Pseudomonas aeruginosa NOT DETECTED NOT DETECTED Final   Stenotrophomonas maltophilia NOT DETECTED NOT DETECTED Final   Candida albicans NOT DETECTED NOT DETECTED Final   Candida auris NOT DETECTED NOT DETECTED Final   Candida glabrata NOT DETECTED NOT DETECTED Final   Candida krusei NOT DETECTED NOT DETECTED Final   Candida parapsilosis NOT DETECTED NOT DETECTED Final   Candida tropicalis NOT DETECTED NOT DETECTED Final   Cryptococcus neoformans/gattii NOT DETECTED NOT DETECTED Final   Meth resistant mecA/C and MREJ NOT DETECTED NOT DETECTED Final    Comment: Performed at [facility], [address], [place], [place] [address]  Blood Culture (routine x 2)     Status: None (Preliminary result)   Collection Time: Apr 28, 2023  1:09 PM   Specimen: BLOOD LEFT HAND  Result Value Ref Range Status   Specimen Description BLOOD LEFT HAND  Final   Special Requests   Final    BOTTLES DRAWN AEROBIC AND ANAEROBIC Blood Culture adequate volume   Culture   Final    NO GROWTH < 24 HOURS Performed at [facility], [address], [place], [place] [address]    Report Status PENDING  Incomplete  Culture, blood (Routine X 2) w Reflex to ID Panel     Status: None (Preliminary result)   Collection Time: 2023/04/28 11:15 PM   Specimen: BLOOD  Result Value Ref Range Status   Specimen Description BLOOD BLOOD LEFT ARM  Final   Special Requests   Final    BOTTLES DRAWN AEROBIC AND ANAEROBIC Blood Culture results may not be optimal due to an inadequate volume of blood received in culture bottles   Culture   Final    NO GROWTH < 12 HOURS Performed at [facility],  [address] [place], [place] [address]    Report Status PENDING  Incomplete  Culture, blood (Routine X 2) w Reflex to ID Panel  Status: None (Preliminary result)   Collection Time: 04/01/23 11:21 PM   Specimen: BLOOD  Result Value Ref Range Status   Specimen Description BLOOD BLOOD LEFT HAND  Final   Special Requests   Final    BOTTLES DRAWN AEROBIC AND ANAEROBIC Blood Culture results may not be optimal due to an inadequate volume of blood received in culture bottles   Culture   Final    NO GROWTH < 12 HOURS Performed at [facility], [address], [place], [place] [address]    Report Status PENDING  Incomplete  MRSA Next Gen by PCR, Nasal     Status: None   Collection Time: 04/02/23  5:42 AM   Specimen: Nasal Mucosa; Nasal Swab  Result Value Ref Range Status   MRSA by PCR Next Gen NOT DETECTED NOT DETECTED Final    Comment: (NOTE) The GeneXpert MRSA Assay (FDA approved for NASAL specimens only), is one component of a comprehensive MRSA colonization surveillance program. It is not intended to diagnose MRSA infection nor to guide or monitor treatment for MRSA infections. Test performance is not FDA approved in patients less than 50 years old. Performed at [facility], [address], [place], [place] [address]     Radiology Reports VAS [place] LOWER EXTREMITY VENOUS (DVT)  Result Date: 04/02/2023  Lower Venous DVT Study Patient Name:  Peter [name]  Date of Exam:   04/02/2023 Medical Rec #: [id number]          Accession #:    [id number] Date of Birth: [birth year]          Patient Gender: M Patient Age:   82 years Exam Location:  [facility] Procedure:      VAS [place] LOWER EXTREMITY VENOUS (DVT) Referring Phys: Kathlen Mody --------------------------------------------------------------------------------  Indications: Swelling.  Limitations: Body habitus and poor ultrasound/tissue interface. Comparison Study: Previous 01/09/18 negative. Performing Technologist: McKayla  Maag RVT, [place]  Examination Guidelines: A complete evaluation includes B-mode imaging, spectral Doppler, color Doppler, and power Doppler as needed of all accessible portions of each vessel. Bilateral testing is considered an integral part of a complete examination. Limited examinations for reoccurring indications may be performed as noted. The reflux portion of the exam is performed with the patient in reverse Trendelenburg.  +-----+---------------+---------+-----------+----------+--------------+ RIGHTCompressibilityPhasicitySpontaneityPropertiesThrombus Aging +-----+---------------+---------+-----------+----------+--------------+ CFV  Full           Yes      Yes                                 +-----+---------------+---------+-----------+----------+--------------+ SFJ  Full                                                        +-----+---------------+---------+-----------+----------+--------------+   +---------+---------------+---------+-----------+----------+-------------------+ LEFT     CompressibilityPhasicitySpontaneityPropertiesThrombus Aging      +---------+---------------+---------+-----------+----------+-------------------+ CFV      Full           Yes      Yes                                      +---------+---------------+---------+-----------+----------+-------------------+ SFJ      Full                                                             +---------+---------------+---------+-----------+----------+-------------------+  FV Prox  Full                                                             +---------+---------------+---------+-----------+----------+-------------------+ FV Mid   Full                                                             +---------+---------------+---------+-----------+----------+-------------------+ FV DistalFull                                                              +---------+---------------+---------+-----------+----------+-------------------+ PFV      Full                                                             +---------+---------------+---------+-----------+----------+-------------------+ POP      Full           Yes      Yes                                      +---------+---------------+---------+-----------+----------+-------------------+ PTV      Full                                                             +---------+---------------+---------+-----------+----------+-------------------+ PERO                                                  Not well visualized +---------+---------------+---------+-----------+----------+-------------------+    Summary: RIGHT: - No evidence of common femoral vein obstruction.  LEFT: - There is no evidence of deep vein thrombosis in the lower extremity. However, portions of this examination were limited- see technologist comments above.  - No cystic structure found in the popliteal fossa.  *See table(s) above for measurements and observations.    Preliminary    DG Abd Portable 1V  Result Date: 04/02/2023 CLINICAL DATA:  Nausea and vomiting EXAM: PORTABLE ABDOMEN - 1 VIEW COMPARISON:  Abdominal CT 04/05/2009 FINDINGS: Normal bowel gas pattern. Stool at the rectum without generalized abnormal stool retention. No concerning mass effect or calcification. Cardiomegaly with clear lung bases. Generalized spondylosis. Unremarkable left hip replacement. IMPRESSION: Normal bowel gas pattern. Electronically Signed   By: Tiburcio Pea M.D.   On: 04/02/2023 06:59   DG Chest Port 1 View  Result Date: 04/02/2023 CLINICAL DATA:  Shortness of breath EXAM: PORTABLE CHEST 1 VIEW COMPARISON:  04/01/2023 FINDINGS: Chronic cardiomegaly. Stable mediastinal contours. Low volume but clear lungs with no effusion or pneumothorax. Artifact from EKG leads. IMPRESSION: Stable low volume chest with cardiomegaly. Electronically  Signed   By: Tiburcio Pea M.D.   On: 04/02/2023 06:58   DG Shoulder Left  Result Date: 04/01/2023 CLINICAL DATA:  Pain after fall EXAM: LEFT SHOULDER - 2 VIEW COMPARISON:  None Available. FINDINGS: Osteopenia. Osteophyte formation of the glenohumeral joint and AC joint with some joint space loss. There is also elevation of the humeral head relative to the acromion. Please correlate for a full-thickness rotator cuff tear. No acute fracture or dislocation. Surgical clips in the left axillary region. IMPRESSION: Moderate degenerative changes. High-riding humeral head. Please correlate for a full-thickness rotator cuff tear Electronically Signed   By: Karen Kays M.D.   On: 04/01/2023 11:04   DG Knee Complete 4 Views Right  Result Date: 04/01/2023 CLINICAL DATA:  Pain after fall EXAM: RIGHT KNEE - COMPLETE 4 VIEW COMPARISON:  None Available. FINDINGS: Moderate joint space loss of the medial compartment. Small osteophytes seen of all 3 compartments greatest of the patellofemoral joint. There is a moderate joint effusion. Hyperostosis. Osteopenia. No fracture or dislocation. IMPRESSION: Tricompartment degenerative changes identified, overall moderate greatest involving the medial compartment and patellofemoral joint. Moderate joint effusion of uncertain etiology. This has a broad differential. This could be related to the degenerative changes. However please correlate for history of trauma or infection. Electronically Signed   By: Karen Kays M.D.   On: 04/01/2023 11:03   DG Knee Complete 4 Views Left  Result Date: 04/01/2023 CLINICAL DATA:  Pain after fall.  Weakness for 2 days EXAM: LEFT KNEE - COMPLETE 4 VIEW COMPARISON:  None Available. FINDINGS: Osteopenia. Slight joint space loss of the medial compartment. Small osteophytes of all 3 compartments. No joint effusion on lateral view. No fracture or dislocation. Hyperostosis of the patella. Vascular calcifications. IMPRESSION: Mild degenerative changes.   No joint effusion. Electronically Signed   By: Karen Kays M.D.   On: 04/01/2023 11:00   CT Head Wo Contrast  Result Date: 04/01/2023 CLINICAL DATA:  Provided history: Polytrauma, blunt. EXAM: CT HEAD WITHOUT CONTRAST CT CERVICAL SPINE WITHOUT CONTRAST TECHNIQUE: Multidetector CT imaging of the head and cervical spine was performed following the standard protocol without intravenous contrast. Multiplanar CT image reconstructions of the cervical spine were also generated. RADIATION DOSE REDUCTION: This exam was performed according to the departmental dose-optimization program which includes automated exposure control, adjustment of the mA and/or kV according to patient size and/or use of iterative reconstruction technique. COMPARISON:  None. FINDINGS: CT HEAD FINDINGS Brain: Mild generalized parenchymal atrophy. There is no acute intracranial hemorrhage. No demarcated cortical infarct. No extra-axial fluid collection. No evidence of an intracranial mass. No midline shift. Vascular: No hyperdense vessel.  Atherosclerotic calcifications. Skull: No calvarial fracture or aggressive osseous lesion. Sinuses/Orbits: No mass or acute finding within the imaged orbits. Mild-to-moderate mucosal thickening, and small volume frothy secretions, within the right maxillary sinus. Small mucous retention cyst within the left maxillary sinus. CT CERVICAL SPINE FINDINGS Alignment: No significant spondylolisthesis. Skull base and vertebrae: The basion-dental and atlanto-dental intervals are maintained.No evidence of acute fracture to the cervical spine. Soft tissues and spinal canal: No prevertebral fluid or swelling. No visible canal hematoma. Disc levels: Cervical spondylosis with multilevel disc space narrowing, disc bulges/central disc protrusions, endplate spurring, uncovertebral hypertrophy and facet arthrosis. Disc space narrowing  is greatest at C5-C6 (moderate/advanced), C7-T1 (moderate/advanced), T1-T2 (advanced) and T2-T3  (advanced). 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 Most notably at C2-C3 and C3-C4, central disc protrusions contribute to at least moderate spinal canal stenosis. Multilevel bony neural foraminal narrowing. Upper chest: No consolidation within the imaged lung apices. No visible pneumothorax. IMPRESSION: CT head: 1.  No evidence of an acute intracranial abnormality. 2. Mild generalized parenchymal atrophy. 3. Paranasal sinus disease as described. CT cervical spine: 1. No evidence of an acute cervical spine fracture. 2. Cervical spondylosis as outlined. Notably at C2-C3 and C3-C4, central disc protrusions contribute to at least moderate spinal canal stenosis. Multilevel bony neural from narrowing. Electronically Signed   By: Jackey Loge D.O.   On: 04/01/2023 10:32   CT Cervical Spine Wo Contrast  Result Date: 04/01/2023 CLINICAL DATA:  Provided history: Polytrauma, blunt. EXAM: CT HEAD WITHOUT CONTRAST CT CERVICAL SPINE WITHOUT CONTRAST TECHNIQUE: Multidetector CT imaging of the head and cervical spine was performed following the standard protocol without intravenous contrast. Multiplanar CT image reconstructions of the cervical spine were also generated. RADIATION DOSE REDUCTION: This exam was performed according to the departmental dose-optimization program which includes automated exposure control, adjustment of the mA and/or kV according to patient size and/or use of iterative reconstruction technique. COMPARISON:  None. FINDINGS: CT HEAD FINDINGS Brain: Mild generalized parenchymal atrophy. There is no acute intracranial hemorrhage. No demarcated cortical infarct. No extra-axial fluid collection. No evidence of an intracranial mass. No midline shift. Vascular: No hyperdense vessel.  Atherosclerotic calcifications. Skull: No calvarial fracture or aggressive osseous lesion. Sinuses/Orbits: No mass or acute finding within the imaged orbits.  Mild-to-moderate mucosal thickening, and small volume frothy secretions, within the right maxillary sinus. Small mucous retention cyst within the left maxillary sinus. CT CERVICAL SPINE FINDINGS Alignment: No significant spondylolisthesis. Skull base and vertebrae: The basion-dental and atlanto-dental intervals are maintained.No evidence of acute fracture to the cervical spine. Soft tissues and spinal canal: No prevertebral fluid or swelling. No visible canal hematoma. Disc levels: Cervical spondylosis with multilevel disc space narrowing, disc bulges/central disc protrusions, endplate spurring, uncovertebral hypertrophy and facet arthrosis. Disc space narrowing is greatest at C5-C6 (moderate/advanced), C7-T1 (moderate/advanced), T1-T2 (advanced) and T2-T3 (advanced). 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 Most notably at C2-C3 and C3-C4, central disc protrusions contribute to at least moderate spinal canal stenosis. Multilevel bony neural foraminal narrowing. Upper chest: No consolidation within the imaged lung apices. No visible pneumothorax. IMPRESSION: CT head: 1.  No evidence of an acute intracranial abnormality. 2. Mild generalized parenchymal atrophy. 3. Paranasal sinus disease as described. CT cervical spine: 1. No evidence of an acute cervical spine fracture. 2. Cervical spondylosis as outlined. Notably at C2-C3 and C3-C4, central disc protrusions contribute to at least moderate spinal canal stenosis. Multilevel bony neural from narrowing. Electronically Signed   By: Jackey Loge D.O.   On: 04/01/2023 10:32   DG Chest Portable 1 View  Result Date: 04/01/2023 CLINICAL DATA:  Shortness of breath EXAM: PORTABLE CHEST 1 VIEW COMPARISON:  Chest radiograph dated 06/09/2018 FINDINGS: The medial apices are obscured due to overlying mandible. Low lung volumes with bronchovascular crowding. Medial right lower lung patchy opacities. No pleural effusion or  pneumothorax. Similar enlarged cardiomediastinal silhouette. No acute osseous abnormality. IMPRESSION: 1. Medial right lower lung patchy opacities, which may represent atelectasis, aspiration, or pneumonia. 2. Similar cardiomegaly. Electronically Signed   By: Milus Height.D.  On: 04/01/2023 08:50      Signature  -   Susa Raring M.D on 04/02/2023 at 11:17 AM   -  To page go to [email or web address]

## 2023-04-02 NOTE — Consult Note (Signed)
Consultation Note Date: 04/02/2023   Patient Name: Peter [name]  DOB: 1951-01-29  MRN: [id number]  Age / Sex: 72 y.o., male  PCP: Peter Heys, MD Referring Physician: Leroy Sea, MD  Reason for Consultation: Establishing goals of care  HPI/Patient Profile: 72 y.o. male   admitted on 04/01/2023 with past   medical history significant of ESRD on HD MWF, Type 2 DM , diet controlled,  anemia of CKD, hypertension, Hyperlipidemia, was brought in for generalized weakness.   As per the patient and his friend at bedside, patient has been having nausea/ vomiting since Sunday morning.   Patient lives alone independently, /his friend significant other/Peter [name] is very active and supportive in his care.  Admitted for treatment and stabilization, full  workup in progress .     Patient faces treatment option decisions advanced directive decisions and anticipatory care needs.   Clinical Assessment and Goals of Care:  This NP Peter [name] reviewed medical records, received report from team, assessed the patient and then meet at the patient's bedside along with his Peter [name] to discuss diagnosis, prognosis, GOC, disposition and options.   Concept of Palliative Care was introduced as specialized medical care for people and their families living with serious illness.  If focuses on providing relief from the symptoms and stress of a serious illness.  The goal is to improve quality of life for both the patient and the family.  Values and goals of care important to patient and family were attempted to be elicited.   A  discussion was had today regarding advanced directives.  Concepts specific to code status, artifical feeding and hydration, continued IV antibiotics and rehospitalization was had.    MOST form was introduced   Patient has been here for less than 24 hours.  Hope is treat the treatable  and patient's return to baseline.   Education offered today regarding  the importance of continued conversation with family and their  medical providers regarding overall plan of care and treatment options,  ensuring decisions are within the context of the patients values and GOCs.  PMT will continue to follow and support holistically,       Questions and concerns addressed.  Patient/ family   encouraged to call with questions or concerns.     PMT will continue to support holistically.          Patient verbalizes that he wishes a significant other/Peter [name] to be his decision maker in the event that he cannot make decisions for himself.   If his documents do not surface, we can assist him with completion with the support of spiritual care department while hospitalized      SUMMARY OF RECOMMENDATIONS    Code Status/Advance Care Planning: Full code   Palliative Prophylaxis:  Aspiration, Bowel Regimen, Delirium Protocol, and Frequent Pain Assessment  Additional Recommendations (Limitations, Scope, Preferences): Full Scope Treatment  Psycho-social/Spiritual:  Desire for further Chaplaincy support:no   Prognosis:  Unable to determine  Discharge Planning: To Be Determined  Primary Diagnoses: Present on Admission:  Aspiration pneumonia (HCC)  Obstructive sleep apnea  Essential hypertension, benign  MVP (mitral valve prolapse)  Anemia in chronic kidney disease  PAF (paroxysmal atrial fibrillation) (HCC)   I have reviewed the medical record, interviewed the patient and family, and examined the patient. The following aspects are pertinent.  Past Medical History:  Diagnosis Date   Anemia in chronic kidney disease (CKD)    CKD (chronic kidney disease) stage 5, GFR less than 15 ml/min (HCC)    M-W-F   Diabetes mellitus without complication (HCC) 03/2012   Hx type II - no meds, diet controlled, lost weight   ED (erectile dysfunction)    Heart murmur    not  significant   History of kidney stones    passed 1 , 1 was removed with kidney disease in 2010   Hypercholesteremia    Hypertension    Iron deficiency    Kidney stone 2010   MVP (mitral valve prolapse)    Posterior with MR by ECHO   OSA (obstructive sleep apnea)    does not use cpap, lost weight    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   Syncope    Social History   Socioeconomic History   Marital status: Media planner    Spouse name: Not on file   Number of children: Not on file   Years of education: Not on file   Highest education level: Not on file  Occupational History   Not on file  Tobacco Use   Smoking status: Former    Types: Cigarettes    Quit date: 06/13/1986    Years since quitting: 36.8   Smokeless tobacco: Current    Types: Chew  Vaping Use   Vaping Use: Never used  Substance and Sexual Activity   Alcohol use: No   Drug use: No   Sexual activity: Yes  Other Topics Concern   Not on file  Social History Narrative   Lives alone.   Daughter.     Social Determinants of Health   Financial Resource Strain: Low Risk  (05/30/2018)   Overall Financial Resource Strain (CARDIA)    Difficulty of Paying Living Expenses: Not hard at all  Food Insecurity: No Food Insecurity (04/01/2023)   Hunger Vital Sign    Worried About Running Out of Food in the Last Year: Never true    Ran Out of Food in the Last Year: Never true  Transportation Needs: No Transportation Needs (04/01/2023)   PRAPARE - Administrator, Civil Service (Medical): No    Lack of Transportation (Non-Medical): No  Physical Activity: Inactive (05/30/2018)   Exercise Vital Sign    Days of Exercise per Week: 0 days    Minutes of Exercise per Session: 0 min  Stress: No Stress Concern Present (05/30/2018)   [employer] of Occupational Health - Occupational Stress Questionnaire    Feeling of Stress : Only a little  Social Connections: Socially Integrated (05/30/2018)    Social Connection and Isolation Panel [NHANES]    Frequency of Communication with Friends and Family: More than three times a week    Frequency of Social Gatherings with Friends and Family: More than three times a week    Attends Religious Services: More than 4 times per year    Active Member of [employer] or Organizations: Yes    Attends Banker Meetings: More than 4 times per year    Marital  Status: Living with partner   Family History  Problem Relation Age of Onset   Dementia Mother    Hypertension Father    Scheduled Meds:  (feeding supplement) PROSource Plus  30 mL Oral BID BM   apixaban  5 mg Oral BID   atorvastatin  10 mg Oral QHS   Chlorhexidine Gluconate Cloth  6 each Topical Daily   cinacalcet  180 mg Oral Q breakfast   ferric citrate  630 mg Oral TID WC   pantoprazole (PROTONIX) IV  40 mg Intravenous Q12H   Continuous Infusions:   ceFAZolin (ANCEF) IV Stopped (04/01/23 2323)   PRN Meds:.acetaminophen, lidocaine (PF), mouth rinse Medications Prior to Admission:  Prior to Admission medications   Medication Sig Start Date End Date Taking? Authorizing Provider  apixaban (ELIQUIS) 5 MG TABS tablet Take 1 tablet (5 mg total) by mouth 2 (two) times daily. 02/26/23   Rollene Rotunda, MD  apixaban (ELIQUIS) 5 MG TABS tablet Take 1 tablet (5 mg total) by mouth 2 (two) times daily. 02/26/23   Rollene Rotunda, MD  atorvastatin (LIPITOR) 10 MG tablet Take 10 mg by mouth at bedtime.    [provider]  AURYXIA 1 GM 210 MG(Fe) tablet Take 420-630 mg by mouth See admin instructions. Take 630mg  by mouth three times daily with meals and take 420mg  by mouth with snacks.    [provider]  cinacalcet (SENSIPAR) 90 MG tablet Take 180 mg by mouth daily.    [provider]  traMADol (ULTRAM) 50 MG tablet Take 1 tablet (50 mg total) by mouth every 6 (six) hours as needed. Patient not taking: Reported on 03/20/2023 01/16/23 01/16/24  Loel Dubonnet P, PA-C    Allergies  Allergen Reactions   Penicillins Other (See Comments)    UNSPECIFIED REACTION Childhood allergy    Doxazosin Mesylate     ineffective for BP (10/2014)   Triamterene-Hctz     increased creatinine (see 09/2014 office note)   Review of Systems  Neurological:  Positive for weakness.    Physical Exam Constitutional:      Appearance: He is overweight. He is ill-appearing.  Cardiovascular:     Rate and Rhythm: Tachycardia present.  Pulmonary:     Effort: Pulmonary effort is normal.  Skin:    General: Skin is warm and dry.  Neurological:     Mental Status: He is lethargic.     Vital Signs: BP 103/80 (BP Location: Left Arm)   Pulse 88   Temp (!) 100.4 F (38 C) (Oral)   Resp 20   Ht 5\' 6"  (1.676 m)   Wt 97.6 kg   SpO2 91%   BMI 34.73 kg/m  Pain Scale: 0-10   Pain Score: 6    SpO2: SpO2: 91 % O2 Device:SpO2: 91 % O2 Flow Rate: .   IO: Intake/output summary:  Intake/Output Summary (Last 24 hours) at 04/02/2023 1043 Last data filed at 04/02/2023 0400 Gross per 24 hour  Intake 50 ml  Output 1500 ml  Net -1450 ml    LBM:   Baseline Weight: Weight: 99.3 kg Most recent weight: Weight: 97.6 kg     Palliative Assessment/Data: Currently 40%, prior to admission 80%   Time 75  minutes discussed with Dr. Thedore Mins and Dr. Ronalee Belts    Signed by: Peter Creed, NP   Please contact Palliative Medicine Team phone at [phone] for questions and concerns.  For individual provider: See Loretha Stapler

## 2023-04-02 NOTE — Progress Notes (Signed)
MRI was attempted 04/02/23 8am. Pt in too much pain to tolerate exam. A call was placed to his RN to see if he could have pain meds. RN stated no pain meds  were ordered and to send pt back to floor.

## 2023-04-02 NOTE — Evaluation (Signed)
Occupational Therapy Evaluation Patient Details Name: Peter [name] MRN: [id number] DOB: [birth year] Today's Date: 04/02/2023   History of Present Illness Pt is a 72 y.o. male admitted 04/01/23 after presenting to ED with generalized weakness, nausea, vomiting and intermittent fevers and chills since Sunday morning. Pt subsequently found have aspiration pneumonia and found to be septic with blood cultures positive for Staph aureus, BCID with MSSA. Pt further reported falling on Sunday evening with pt unable to get up and on the floor for a few hours, bruising and swelling noted around Left eye/face. PMH significant of ESRD on HD MWF, Type 2 DM, anemia of CKD, hypertension, paroxysmal atrial fibrillation, and hyperlipidemia.   Clinical Impression   At baseline, pt completes ADLs and IADLs Independently and functional transfers/mobility Independent without an AD to Mod I with use of Rollator. At baseline, pt drives. Pt now presents with generalized B UE weakness worse on Right, decreased B UE AROM worse on Right, mildly edematous Right hand/wrist, decreased Right UE fine and gross motor coordination, decreased cognition, impaired praxis, increased pain, and decreased safety and independence with ADLs, bed mobility in preparation for ADLs, and functional mobility/transfers. Pt currently demonstrates ability to complete UB ADLs with Set up to Max assist primarily utilizing non-dominant Left UE, LB ADLs with Total assist +2, and rolling in the bed with Total assist +2 secondary to pain and decreased functional use of B UE worse on Right. Pt will benefit from acute skilled OT services to address deficits outlined below and to increase safety, decrease caregiver burden, and independence with ADLs, bed mobility in preparation for ADLs, and functional transfers/mobility. Post acute discharge, pt will benefit from intensive inpatient skilled OT services < 3 hours per day to maximize rehab potential.       Recommendations for follow up therapy are one component of a multi-disciplinary discharge planning process, led by the attending physician.  Recommendations may be updated based on patient status, additional functional criteria and insurance authorization.   Assistance Recommended at Discharge Frequent or constant Supervision/Assistance  Patient can return home with the following Two people to help with walking and/or transfers;Two people to help with bathing/dressing/bathroom;Assistance with cooking/housework;Assistance with feeding;Direct supervision/assist for medications management;Direct supervision/assist for financial management;Assist for transportation;Help with stairs or ramp for entrance (Set up for self feeding)    Functional Status Assessment  Patient has had a recent decline in their functional status and demonstrates the ability to make significant improvements in function in a reasonable and predictable amount of time.  Equipment Recommendations  Other (comment) (Defer to next level of care)    Recommendations for Other Services       Precautions / Restrictions Precautions Precautions: Fall Restrictions Weight Bearing Restrictions: No      Mobility Bed Mobility Overal bed mobility: Needs Assistance Bed Mobility: Rolling Rolling: Total assist, +2 for physical assistance, +2 for safety/equipment         General bed mobility comments: Pt functional level significantly affected by pain, generalized weakness and decreased AROM in Bilateral UE worse in Right, decreased cognition, and praxis deficits.    Transfers                   General transfer comment: Deferred this session for pt/therapist safety.      Balance Overall balance assessment: History of Falls  ADL either performed or assessed with clinical judgement   ADL Overall ADL's : Needs assistance/impaired Eating/Feeding: Set up;Bed  level Eating/Feeding Details (indicate cue type and reason): Pt reports he is Right handed but is unable to utilize Right UE for self feeding this day. Pt demonstrates ability to self feed with Set up using Left UE. Grooming: Minimal assistance;Bed level Grooming Details (indicate cue type and reason): Pt reports he is Right handed but is unable to utilize Right UE for grooming this day. Pt demonstrates ability to complete grooming tasks using Left UE with Min assist. Upper Body Bathing: Maximal assistance;Bed level   Lower Body Bathing: Total assistance;+2 for physical assistance;+2 for safety/equipment;Bed level   Upper Body Dressing : Maximal assistance;Bed level   Lower Body Dressing: Total assistance;+2 for physical assistance;+2 for safety/equipment;Bed level       Toileting- Clothing Manipulation and Hygiene: Total assistance;+2 for physical assistance;+2 for safety/equipment;Bed level         General ADL Comments: Pt functional level significantly affected by pain, generalized weakness and decreased AROM in Bilateral UE worse in Right, decreased cognition, and praxis deficits.     Vision Patient Visual Report: No change from baseline;Other (comment) (Pt reports no change. Uncertain if pt typicallys wears glasses. Pt with limited answers to questions this day secondary to decreased cognition, increased pain, and lethergy.) Additional Comments: OT to further assess during fuctional tasks during future skilled OT sessions.     Perception     Praxis Praxis Praxis tested?: Deficits Deficits: Initiation;Organization;Motor Impersistence    Pertinent Vitals/Pain Pain Assessment Pain Assessment: Faces Faces Pain Scale: Hurts whole lot Pain Location: Bilateral UE worse with AROM/AAROM/PROM, Right knee, and Left hip Pain Descriptors / Indicators: Discomfort, Grimacing, Guarding Pain Intervention(s): Limited activity within patient's tolerance, Monitored during session,  Repositioned, Patient requesting pain meds-RN notified, RN gave pain meds during session     Hand Dominance Right   Extremity/Trunk Assessment Upper Extremity Assessment Upper Extremity Assessment: Generalized weakness;RUE deficits/detail;LUE deficits/detail RUE Deficits / Details: Pt demonstrates strength 3-/5 in Right hand. Pt unable to perform other Right UE AROM. PROM of Right wrist and elbow WFL. PROM of Right UE significantly limited due to pain. OT to assess further during future skilled OT sessions. Mildly edematous Right hand and wrist noted. RUE Sensation: decreased light touch;decreased proprioception RUE Coordination: decreased fine motor;decreased gross motor LUE Deficits / Details: generalized weakness, overall Left UE strength 3- to 3/5 with impaired AROM shoulder flexion, PROM WFL LUE Sensation: decreased proprioception LUE Coordination: decreased fine motor;decreased gross motor   Lower Extremity Assessment Lower Extremity Assessment: Defer to PT evaluation       Communication Communication Communication: Receptive difficulties;Expressive difficulties;Other (comment) (Requires increased time for processing and word finding. It is unclear if pt is a reliable reporter as pt gave conflicting informaiton during OT session.)   Cognition Arousal/Alertness: Lethargic Behavior During Therapy: Flat affect Overall Cognitive Status: Impaired/Different from baseline Area of Impairment: Orientation, Attention, Memory, Following commands, Safety/judgement, Awareness, Problem solving                 Orientation Level:  (Pt inititially stating it is February but self corrected with increased time.) Current Attention Level: Focused Memory: Decreased short-term memory Following Commands: Follows one step commands inconsistently, Follows one step commands with increased time Safety/Judgement: Decreased awareness of safety, Decreased awareness of deficits Awareness:  Emergent Problem Solving: Slow processing, Requires verbal cues, Requires tactile cues, Difficulty sequencing, Decreased initiation  General Comments  Pt with increased HR 110s to low 130s throughout session. All other VSS on RA throughout session. Pt with significant pain. Pt's Bilateral UE warm to the touch worse on Right. Edematous Right hand/wrist. RN notified. OT educated pt on Right UE positioning to address edematous Right hand/wrist with pt verbalizing understanding. Pt will benefit from reinforcement of education.    Exercises     Shoulder Instructions      Home Living Family/patient expects to be discharged to:: Private residence Living Arrangements: Alone (Pt reports to OT that he lives alone and is no longer married. However, pt reported to another staff during session that he is married. Note from April 2024 states pt lives with spouse.) Available Help at Discharge: Friend(s) (Pt reports frequent assistance from friend [name]) Type of Home: House Home Access: Stairs to enter [employer] of Steps: 3 Entrance Stairs-Rails: None Home Layout: One level     Bathroom Shower/Tub: Producer, television/film/video: Standard     Home Equipment: Rollator (4 wheels)          Prior Functioning/Environment Prior Level of Function : Independent/Modified Independent             Mobility Comments: Pt reports he typically performs functional mobility Independent without an AD and occasionally uses Rollator ADLs Comments: Pt reports he is typically Independent will all ADLs and IADLs and drives        OT Problem List: Decreased strength;Decreased activity tolerance;Decreased coordination;Decreased cognition;Pain;Impaired UE functional use      OT Treatment/Interventions: Self-care/ADL training;Therapeutic exercise;Energy conservation;DME and/or AE instruction;Therapeutic activities;Cognitive remediation/compensation;Patient/family education;Balance training     OT Goals(Current goals can be found in the care plan section) Acute Rehab OT Goals Patient Stated Goal: Pt unable to state this session. OT Goal Formulation: Patient unable to participate in goal setting Time For Goal Achievement: 04/16/23 Potential to Achieve Goals: Good ADL Goals Pt Will Perform Grooming: with supervision;sitting (sitting EOB with Fair balance) Pt Will Perform Lower Body Bathing: sit to/from stand;with min assist (with adaptive equipment as needed) Pt Will Perform Upper Body Dressing: with supervision;sitting (sitting EOB with Fair balance) Pt Will Perform Lower Body Dressing: sit to/from stand;with min assist (with adaptive equipment as needed) Pt Will Transfer to Toilet: with min assist;ambulating;bedside commode (with least restrictive AD) Pt Will Perform Toileting - Clothing Manipulation and hygiene: with min guard assist;sit to/from stand  OT Frequency: Min 2X/week    Co-evaluation              AM-PAC OT "6 Clicks" Daily Activity     Outcome Measure Help from another person eating meals?: A Little Help from another person taking care of personal grooming?: A Little Help from another person toileting, which includes using toliet, bedpan, or urinal?: Total Help from another person bathing (including washing, rinsing, drying)?: A Lot Help from another person to put on and taking off regular upper body clothing?: A Lot Help from another person to put on and taking off regular lower body clothing?: Total 6 Click Score: 12   End of Session Nurse Communication: Mobility status;Other (comment);Patient requests pain meds (Pt with limited participation. Pt with increased HR 110s to low 130s throughout session. Pt with significant pain, mildly edematous Right hand/wrist, Bilateral UE warm to the touch worse on Right.)  Activity Tolerance: No increased pain;Patient limited by lethargy;Other (comment) (Limited by decreased cognition and praxis deficits) Patient left:  in bed;with call bell/phone within reach;with bed alarm set;with nursing/sitter in room  OT Visit Diagnosis: History of falling (Z91.81);Other abnormalities of gait and mobility (R26.89);Muscle weakness (generalized) (M62.81);Ataxia, unspecified (R27.0);Pain                Time: 4098-1191 OT Time Calculation (min): 29 min Charges:  OT General Charges $OT Visit: 1 Visit OT Evaluation $OT Eval Moderate Complexity: 1 Mod  Ryelan Kazee "[employer] OTR/L, MA Acute Rehab [phone]   Lendon Colonel 04/02/2023, 2:02 PM

## 2023-04-02 NOTE — Evaluation (Signed)
Physical Therapy Evaluation Patient Details Name: Peter [name] MRN: [id number] DOB: 08-02-1951 Today's Date: 04/02/2023  History of Present Illness  Pt is a 72 y.o. male admitted 04/01/23 after presenting to ED with generalized weakness, nausea, vomiting and intermittent fevers and chills since Sunday morning. Pt subsequently found have aspiration pneumonia and found to be septic with blood cultures positive for Staph aureus, BCID with MSSA. Pt further reported falling on Sunday evening, unable to get up and on the floor for a few hours, bruising and swelling noted around Left eye/face. PMH significant of ESRD on HD MWF, Type 2 DM, anemia of CKD, hypertension, paroxysmal atrial fibrillation, and hyperlipidemia.  Clinical Impression  Pt presents today with impaired functional mobility, limited by strength, balance, activity tolerance, cognition, and pain. Pt reports having a fall a few days ago, reporting R knee pain since then, having difficulty moving his knee against gravity. Pt requires increased time and multimodal cues today, with maxA needed for supine<>sit with HOB elevated, unable to progress OOB due to pt fatiguing at EOB and requesting to return to supine. At baseline, pt is independent with all mobility and lives alone, but has intermittent help from a friend. Pt will continue to benefit from skilled acute PT to progress mobility and address deficits. At this time, recommend subacute PT in a facility setting upon discharge. Acute PT will continue to follow as appropriate.       Assistance Recommended at Discharge Frequent or constant Supervision/Assistance  If plan is discharge home, recommend the following:  Can travel by private vehicle  Two people to help with walking and/or transfers;A lot of help with bathing/dressing/bathroom;Assistance with cooking/housework;Assist for transportation;Help with stairs or ramp for entrance;Direct supervision/assist for medications  management   No    Equipment Recommendations None recommended by PT  Recommendations for Other Services       Functional Status Assessment Patient has had a recent decline in their functional status and demonstrates the ability to make significant improvements in function in a reasonable and predictable amount of time.     Precautions / Restrictions Precautions Precautions: Fall;Other (comment) Precaution Comments: watch O2 Restrictions Weight Bearing Restrictions: No      Mobility  Bed Mobility Overal bed mobility: Needs Assistance Bed Mobility: Supine to Sit, Sit to Supine     Supine to sit: Max assist, HOB elevated Sit to supine: Max assist, HOB elevated   General bed mobility comments: Pt able to progress LLE off bed but requiring cueing and assist for RLE management as well as trunk support into sitting. Assist for BLE back onto bed and trunk support for controlled descent    Transfers Overall transfer level: Needs assistance                 General transfer comment: unable to assess today as pt fatiguing sitting EOB, pt requesting to return to supine    Ambulation/Gait               General Gait Details: unable to assess today  Stairs            Wheelchair Mobility     Tilt Bed    Modified Rankin (Stroke Patients Only)       Balance Overall balance assessment: Needs assistance, History of Falls Sitting-balance support: No upper extremity supported, Feet supported Sitting balance-Leahy Scale: Fair Sitting balance - Comments: close guard on EOB  Pertinent Vitals/Pain Pain Assessment Pain Assessment: Faces Faces Pain Scale: Hurts whole lot Pain Location: generalized throughout 4 extremities Pain Descriptors / Indicators: Aching, Discomfort, Grimacing, Guarding Pain Intervention(s): Limited activity within patient's tolerance, Monitored during session, Repositioned    Home  Living Family/patient expects to be discharged to:: Private residence Living Arrangements: Alone Available Help at Discharge: Friend(s);Available PRN/intermittently Type of Home: House Home Access: Stairs to enter Entrance Stairs-Rails: None Entrance Stairs-Number of Steps: 3   Home Layout: One level Home Equipment: Agricultural consultant (2 wheels);Cane - single point      Prior Function Prior Level of Function : Independent/Modified Independent;Driving;History of Falls (last six months)             Mobility Comments: independent with all mobility, recently got a RW after the fall on Sunday, denies other falls recently ADLs Comments: independent, drives     Hand Dominance   Dominant Hand: Right    Extremity/Trunk Assessment   Upper Extremity Assessment Upper Extremity Assessment: Defer to OT evaluation RUE Deficits / Details: Pt demonstrates strength 3-/5 in Right hand. Pt unable to perform other Right UE AROM. PROM of Right wrist and elbow WFL. PROM of Right UE significantly limited due to pain. OT to assess further during future skilled OT sessions. Mildly edematous Right hand and wrist noted. RUE Sensation: decreased light touch;decreased proprioception RUE Coordination: decreased fine motor;decreased gross motor LUE Deficits / Details: generalized weakness, overall Left UE strength 3- to 3/5 with impaired AROM shoulder flexion, PROM WFL LUE Sensation: decreased proprioception LUE Coordination: decreased fine motor;decreased gross motor    Lower Extremity Assessment Lower Extremity Assessment: Generalized weakness;RLE deficits/detail RLE Deficits / Details: R knee painful with passive extension, requiring assistance for hip and knee ROM due to weakness and pain. Swelling noted to R knee    Cervical / Trunk Assessment Cervical / Trunk Assessment: Kyphotic  Communication   Communication: No difficulties  Cognition Arousal/Alertness: Awake/alert, Lethargic Behavior During  Therapy: Flat affect Overall Cognitive Status: Impaired/Different from baseline Area of Impairment: Memory, Following commands, Safety/judgement, Awareness, Problem solving, Attention                   Current Attention Level: Focused Memory: Decreased short-term memory Following Commands: Follows one step commands with increased time, Follows one step commands inconsistently Safety/Judgement: Decreased awareness of safety, Decreased awareness of deficits Awareness: Emergent Problem Solving: Slow processing, Requires verbal cues, Requires tactile cues General Comments: Pt oriented x4, intermittently drowsy but able to answer questions and particiapte with increased time for processing and multimodal cueing        General Comments General comments (skin integrity, edema, etc.): HR increasing to 110-130s at EOB, SPO2 stable. Noted R knee swelling, as well as RUE, both UE propped on pillows at end of session    Exercises     Assessment/Plan    PT Assessment Patient needs continued PT services  PT Problem List Decreased strength;Decreased range of motion;Decreased activity tolerance;Decreased balance;Decreased coordination;Decreased mobility;Decreased cognition;Decreased knowledge of use of DME;Decreased safety awareness;Decreased knowledge of precautions;Cardiopulmonary status limiting activity;Pain       PT Treatment Interventions DME instruction;Gait training;Stair training;Functional mobility training;Therapeutic activities;Therapeutic exercise;Balance training;Neuromuscular re-education;Cognitive remediation;Patient/family education    PT Goals (Current goals can be found in the Care Plan section)  Acute Rehab PT Goals Patient Stated Goal: get better PT Goal Formulation: With patient Time For Goal Achievement: 04/16/23 Potential to Achieve Goals: Fair    Frequency Min 3X/week     Co-evaluation  AM-PAC PT "6 Clicks" Mobility  Outcome Measure Help  needed turning from your back to your side while in a flat bed without using bedrails?: A Lot Help needed moving from lying on your back to sitting on the side of a flat bed without using bedrails?: A Lot Help needed moving to and from a bed to a chair (including a wheelchair)?: Total Help needed standing up from a chair using your arms (e.g., wheelchair or bedside chair)?: Total Help needed to walk in hospital room?: Total Help needed climbing 3-5 steps with a railing? : Total 6 Click Score: 8    End of Session   Activity Tolerance: Patient limited by pain Patient left: in bed;with call bell/phone within reach;with bed alarm set Nurse Communication: Mobility status PT Visit Diagnosis: Muscle weakness (generalized) (M62.81);History of falling (Z91.81);Other abnormalities of gait and mobility (R26.89);Pain Pain - Right/Left: Right Pain - part of body: Knee    Time: 1610-9604 PT Time Calculation (min) (ACUTE ONLY): 16 min   Charges:   PT Evaluation $PT Eval Moderate Complexity: 1 Mod   PT General Charges $$ ACUTE PT VISIT: 1 Visit         Lindalou Hose, PT DPT Acute Rehabilitation Services Office [phone]   Leonie Man 04/02/2023, 3:01 PM

## 2023-04-02 NOTE — Progress Notes (Signed)
PRE-PROCEDURE DIAGNOSIS:  Right knee effusion(s) POST-OPERATIVE DIAGNOSIS:  Same PROCEDURE:  Aspiration / Intra-articular injection right knee  PROCEDURE DETAILS:  After informed verbal consent was obtained the superolateral portal was prepped with chlorhexidine. Approximately 3 mL of lidocaine were injected superficially and then into the deeper tissues. Flash identified appropriate positioning and pressurized joint space. Medicine was left to sit for several minutes to take effect. Next, a 20 mL syringe with an 18-gauge needle was used to aspirate approximately 20 ml of turbid appearing joint fluid. He tolerated this well without complication and a Band-Aid was placed. Aspirate was sent to the lab for gram stain, analysis, and cultures.   Janine Ores, PA-C  04/02/2023 9:59 PM

## 2023-04-03 ENCOUNTER — Encounter (HOSPITAL_COMMUNITY): Payer: Self-pay | Admitting: Certified Registered"

## 2023-04-03 ENCOUNTER — Inpatient Hospital Stay (HOSPITAL_COMMUNITY): Payer: Medicare Other

## 2023-04-03 ENCOUNTER — Other Ambulatory Visit (HOSPITAL_COMMUNITY): Payer: Medicare Other

## 2023-04-03 ENCOUNTER — Encounter (HOSPITAL_COMMUNITY): Admission: EM | Disposition: E | Payer: Self-pay | Source: Home / Self Care | Attending: Pulmonary Disease

## 2023-04-03 DIAGNOSIS — B9561 Methicillin susceptible Staphylococcus aureus infection as the cause of diseases classified elsewhere: Secondary | ICD-10-CM | POA: Diagnosis not present

## 2023-04-03 DIAGNOSIS — A419 Sepsis, unspecified organism: Secondary | ICD-10-CM | POA: Diagnosis not present

## 2023-04-03 DIAGNOSIS — M00061 Staphylococcal arthritis, right knee: Secondary | ICD-10-CM | POA: Diagnosis not present

## 2023-04-03 DIAGNOSIS — M00031 Staphylococcal arthritis, right wrist: Secondary | ICD-10-CM

## 2023-04-03 DIAGNOSIS — I4891 Unspecified atrial fibrillation: Secondary | ICD-10-CM

## 2023-04-03 DIAGNOSIS — M00019 Staphylococcal arthritis, unspecified shoulder: Secondary | ICD-10-CM | POA: Insufficient documentation

## 2023-04-03 DIAGNOSIS — R7881 Bacteremia: Secondary | ICD-10-CM

## 2023-04-03 DIAGNOSIS — M00012 Staphylococcal arthritis, left shoulder: Secondary | ICD-10-CM | POA: Diagnosis not present

## 2023-04-03 DIAGNOSIS — Z7901 Long term (current) use of anticoagulants: Secondary | ICD-10-CM

## 2023-04-03 DIAGNOSIS — N186 End stage renal disease: Secondary | ICD-10-CM | POA: Diagnosis not present

## 2023-04-03 DIAGNOSIS — J690 Pneumonitis due to inhalation of food and vomit: Secondary | ICD-10-CM | POA: Diagnosis not present

## 2023-04-03 LAB — BASIC METABOLIC PANEL
Anion gap: 18 — ABNORMAL HIGH (ref 5–15)
BUN: 68 mg/dL — ABNORMAL HIGH (ref 8–23)
CO2: 25 mmol/L (ref 22–32)
Calcium: 8.7 mg/dL — ABNORMAL LOW (ref 8.9–10.3)
Chloride: 87 mmol/L — ABNORMAL LOW (ref 98–111)
Creatinine, Ser: 7.58 mg/dL — ABNORMAL HIGH (ref 0.61–1.24)
GFR, Estimated: 7 mL/min — ABNORMAL LOW (ref >60)
Glucose, Bld: 143 mg/dL — ABNORMAL HIGH (ref 70–99)
Potassium: 4.4 mmol/L (ref 3.5–5.1)
Sodium: 130 mmol/L — ABNORMAL LOW (ref 135–145)

## 2023-04-03 LAB — CBC WITH DIFFERENTIAL/PLATELET
Abs Immature Granulocytes: 0.14 10*3/uL — ABNORMAL HIGH (ref 0.00–0.07)
Basophils Absolute: 0 10*3/uL (ref 0.0–0.1)
Basophils Relative: 0 %
Eosinophils Absolute: 0 10*3/uL (ref 0.0–0.5)
Eosinophils Relative: 0 %
HCT: 35.2 % — ABNORMAL LOW (ref 39.0–52.0)
Hemoglobin: 12.1 g/dL — ABNORMAL LOW (ref 13.0–17.0)
Immature Granulocytes: 2 %
Lymphocytes Relative: 8 %
Lymphs Abs: 0.7 10*3/uL (ref 0.7–4.0)
MCH: 33.4 pg (ref 26.0–34.0)
MCHC: 34.4 g/dL (ref 30.0–36.0)
MCV: 97.2 fL (ref 80.0–100.0)
Monocytes Absolute: 0.8 10*3/uL (ref 0.1–1.0)
Monocytes Relative: 9 %
Neutro Abs: 7.2 10*3/uL (ref 1.7–7.7)
Neutrophils Relative %: 81 %
Platelets: 87 10*3/uL — ABNORMAL LOW (ref 150–400)
RBC: 3.62 MIL/uL — ABNORMAL LOW (ref 4.22–5.81)
RDW: 14.3 % (ref 11.5–15.5)
WBC: 8.8 10*3/uL (ref 4.0–10.5)
nRBC: 0 % (ref 0.0–0.2)

## 2023-04-03 LAB — ECHOCARDIOGRAM COMPLETE
AR max vel: 1.77 cm2
AV Area VTI: 1.54 cm2
AV Area mean vel: 1.67 cm2
AV Mean grad: 9 mmHg
AV Peak grad: 16.6 mmHg
Ao pk vel: 2.04 m/s
Area-P 1/2: 4.15 cm2
Height: 66 in
MV M vel: 2.8 m/s
MV Peak grad: 31.4 mmHg
S' Lateral: 2.6 cm
Weight: 3456.81 oz

## 2023-04-03 LAB — SYNOVIAL CELL COUNT + DIFF, W/ CRYSTALS
Crystals, Fluid: NONE SEEN
Eosinophils-Synovial: 0 % (ref 0–1)
Lymphocytes-Synovial Fld: 0 % (ref 0–20)
Monocyte-Macrophage-Synovial Fluid: 2 % — ABNORMAL LOW (ref 50–90)
Neutrophil, Synovial: 98 % — ABNORMAL HIGH (ref 0–25)
WBC, Synovial: 3700 /mm3 — ABNORMAL HIGH (ref 0–200)

## 2023-04-03 LAB — APTT
aPTT: 71 seconds — ABNORMAL HIGH (ref 24–36)
aPTT: 71 seconds — ABNORMAL HIGH (ref 24–36)

## 2023-04-03 LAB — HEPATIC FUNCTION PANEL
ALT: 76 U/L — ABNORMAL HIGH (ref 0–44)
AST: 158 U/L — ABNORMAL HIGH (ref 15–41)
Albumin: 1.9 g/dL — ABNORMAL LOW (ref 3.5–5.0)
Alkaline Phosphatase: 45 U/L (ref 38–126)
Bilirubin, Direct: 0.4 mg/dL — ABNORMAL HIGH (ref 0.0–0.2)
Indirect Bilirubin: 0.9 mg/dL (ref 0.3–0.9)
Total Bilirubin: 1.3 mg/dL — ABNORMAL HIGH (ref 0.3–1.2)
Total Protein: 6.5 g/dL (ref 6.5–8.1)

## 2023-04-03 LAB — MAGNESIUM: Magnesium: 2.2 mg/dL (ref 1.7–2.4)

## 2023-04-03 LAB — BLOOD GAS, VENOUS
Acid-Base Excess: 6.1 mmol/L — ABNORMAL HIGH (ref 0.0–2.0)
Bicarbonate: 29.6 mmol/L — ABNORMAL HIGH (ref 20.0–28.0)
Drawn by: 62344
O2 Saturation: 85.2 %
Patient temperature: 36.7
pCO2, Ven: 37 mmHg — ABNORMAL LOW (ref 44–60)
pH, Ven: 7.51 — ABNORMAL HIGH (ref 7.25–7.43)
pO2, Ven: 51 mmHg — ABNORMAL HIGH (ref 32–45)

## 2023-04-03 LAB — PROCALCITONIN: Procalcitonin: 38.83 ng/mL

## 2023-04-03 LAB — CULTURE, BLOOD (ROUTINE X 2)
Culture: NO GROWTH
Culture: NO GROWTH

## 2023-04-03 LAB — BRAIN NATRIURETIC PEPTIDE: B Natriuretic Peptide: 136.6 pg/mL — ABNORMAL HIGH (ref 0.0–100.0)

## 2023-04-03 LAB — AMMONIA: Ammonia: 20 umol/L (ref 9–35)

## 2023-04-03 LAB — HEPARIN LEVEL (UNFRACTIONATED): Heparin Unfractionated: >1.1 IU/mL — ABNORMAL HIGH (ref 0.30–0.70)

## 2023-04-03 LAB — C-REACTIVE PROTEIN: CRP: 43.8 mg/dL — ABNORMAL HIGH (ref <1.0)

## 2023-04-03 SURGERY — ARTHROSCOPY, KNEE
Anesthesia: General | Site: Knee | Laterality: Right

## 2023-04-03 MED ORDER — NAFCILLIN SODIUM 2 G IJ SOLR: 2.0000 g | INTRAMUSCULAR | Status: DC

## 2023-04-03 MED ORDER — METOPROLOL TARTRATE 25 MG PO TABS: 25.0000 mg | ORAL_TABLET | Freq: Two times a day (BID) | ORAL | Status: DC

## 2023-04-03 MED ORDER — MAGNESIUM SULFATE IN D5W 1-5 GM/100ML-% IV SOLN
1.0000 g | Freq: Once | INTRAVENOUS | Status: AC
  Administered 2023-04-03: 1 g via INTRAVENOUS
  Filled 2023-04-03: qty 100

## 2023-04-03 MED ORDER — SODIUM CHLORIDE 0.9 % IV SOLN
2.0000 g | INTRAVENOUS | Status: DC
  Administered 2023-04-03 – 2023-04-10 (×39): 2 g via INTRAVENOUS
  Filled 2023-04-03: qty 2
  Filled 2023-04-03 (×5): qty 8
  Filled 2023-04-03: qty 2
  Filled 2023-04-03: qty 8
  Filled 2023-04-03 (×2): qty 2
  Filled 2023-04-03 (×12): qty 8
  Filled 2023-04-03: qty 2
  Filled 2023-04-03 (×4): qty 8
  Filled 2023-04-03 (×2): qty 2
  Filled 2023-04-03: qty 8
  Filled 2023-04-03: qty 2
  Filled 2023-04-03: qty 8
  Filled 2023-04-03: qty 2
  Filled 2023-04-03 (×4): qty 8
  Filled 2023-04-03 (×4): qty 2
  Filled 2023-04-03 (×7): qty 8

## 2023-04-03 NOTE — Progress Notes (Signed)
ANTICOAGULATION CONSULT NOTE - follow-up  Pharmacy Consult for heparin Indication: atrial fibrillation  Allergies  Allergen Reactions   Doxazosin Mesylate     ineffective for BP (10/2014)   Triamterene-Hctz     increased creatinine (see 09/2014 office note)    Patient Measurements: Height: 5\' 6"  (167.6 cm) Weight: 98 kg (216 lb 0.8 oz) IBW/kg (Calculated) : 63.8 Heparin Dosing Weight: 85.6 kg  Vital Signs: Temp: 98 F (36.7 C) (07/04 1054) Temp Source: Oral (07/04 1054) BP: 128/44 (07/04 1054) Pulse Rate: 9 (07/04 1054)  Labs: Recent Labs    04/01/23 0851 04/01/23 1447 04/02/23 0340 04/02/23 0555 04/03/23 0140 04/03/23 0730  HGB 13.8  --  12.9*  --  12.1*  --   HCT 40.6  --  37.0*  --  35.2*  --   PLT 85*  --  77*  --  87*  --   APTT 33  --   --   --  71* 71*  LABPROT 19.0*  --   --   --   --   --   INR 1.6*  --   --   --   --   --   HEPARINUNFRC  --   --   --   --  >1.10*  --   CREATININE 13.88* 13.58*  --  7.96*  --   --      Estimated Creatinine Clearance: 8.9 mL/min (A) (by C-G formula based on SCr of 7.96 mg/dL (H)).   Medical History: Past Medical History:  Diagnosis Date   Anemia in chronic kidney disease (CKD)    CKD (chronic kidney disease) stage 5, GFR less than 15 ml/min (HCC)    M-W-F   Diabetes mellitus without complication (HCC) 03/2012   Hx type II - no meds, diet controlled, lost weight   ED (erectile dysfunction)    Heart murmur    not significant   History of kidney stones    passed 1 , 1 was removed with kidney disease in 2010   Hypercholesteremia    Hypertension    Iron deficiency    Kidney stone 2010   MVP (mitral valve prolapse)    Posterior with MR by ECHO   OSA (obstructive sleep apnea)    does not use cpap, lost weight    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   Syncope     Medications:  Medications Prior to Admission  Medication Sig Dispense Refill Last Dose   apixaban (ELIQUIS) 5 MG TABS  tablet Take 1 tablet (5 mg total) by mouth 2 (two) times daily. 60 tablet 11    apixaban (ELIQUIS) 5 MG TABS tablet Take 1 tablet (5 mg total) by mouth 2 (two) times daily. 28 tablet 0    atorvastatin (LIPITOR) 10 MG tablet Take 10 mg by mouth at bedtime.      AURYXIA 1 GM 210 MG(Fe) tablet Take 420-630 mg by mouth See admin instructions. Take 630mg  by mouth three times daily with meals and take 420mg  by mouth with snacks.      cinacalcet (SENSIPAR) 90 MG tablet Take 180 mg by mouth daily.      traMADol (ULTRAM) 50 MG tablet Take 1 tablet (50 mg total) by mouth every 6 (six) hours as needed. (Patient not taking: Reported on 03/20/2023) 15 tablet 0    Scheduled:   (feeding supplement) PROSource Plus  30 mL Oral BID BM   atorvastatin  10 mg Oral QHS  bupivacaine(PF)  10 mL Infiltration Once   Chlorhexidine Gluconate Cloth  6 each Topical Daily   Chlorhexidine Gluconate Cloth  6 each Topical Q0600   cinacalcet  180 mg Oral Q breakfast   diltiazem  60 mg Oral Q12H   ferric citrate  630 mg Oral TID WC   multivitamin  1 tablet Oral QHS   pantoprazole (PROTONIX) IV  40 mg Intravenous Q12H    Assessment: Peter [name] w/ prior history of AF requiring heparin gtt. Last dose of apixaban given ~10 AM on 04/02/2023  7/4 AM update: HL >1.10- effects of apixaban aPTT 71 seconds (therapeutic) Hgb 12.1 / PLT 71  No signs of bleeding or issues with the heparin infusion  Confirmatory aPTT @ 0730 resulted as 71 seconds  Goal of Therapy:  Heparin level 0.3-0.7 units/ml aPTT 66-102 seconds Monitor platelets by anticoagulation protocol: Yes   Plan:  Continue heparin infusion at 1200 units/hr Check daily (aPTT / HL) while on heparin May stop aPTT when HL correlates Continue to monitor H&H and platelets  Rakiyah Esch BS, PharmD, BCPS Clinical Pharmacist 04/03/2023 12:14 PM  Contact: [phone] after 3 PM  "Be curious, not judgmental..." -Debbora Dus

## 2023-04-03 NOTE — Progress Notes (Signed)
[facility] Progress Note  Patient ID: Peter [name] MRN: [id number] DOB/AGE: 1951/08/02 72 y.o.  Subjective:  72 y/o male, ESRD on HD, T2DM, HTN Admitted on 04/01/2023. 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  Aspiration performed, 20cc of fluid obtained and sent for cell count and culture  Cell count notable for WBC of 3,700, pmn\'s 98%  Fluid amber color and no crystals   Body fluid culture was showing gram positive cocci    Pt evaluated at bedside again today after MRI of L shoulder and c-spine  States his R knee feels better than yesterday  Does not report significant pain in his L shoulder   Reports history of R knee and L shoulder pain  Does not remember how he landed on Sunday when he fell   He is RHD   Pt is currently afebrile   He received ancef, rocephin and azithromycin on 7/2   Unasyn on 7/3 and 7/4   Abx just changed to nafcillin to start at 1200 today (7/4)  Current dialysis access in R arm    ROS As above  Objective:   VITALS:   Vitals:   04/03/23 0400 04/03/23 0459 04/03/23 0800 04/03/23 1054  BP: (!) 96/53 (!) 116/55 110/63 (!) 128/44  Pulse: 90 (!) 55 92 (!) 9  Resp: 20 (!) 21 (!) 29 (!) 29  Temp: 99.5 F (37.5 C)  98.2 F (36.8 C) 98 F (36.7 C)  TempSrc: Oral  Oral Oral  SpO2: 91% 90% 93% 91%  Weight:      Height:        Estimated body mass index is 34.87 kg/m as calculated from the following:   Height as of this encounter: 5\' 6" (1.676 m).   Weight as of  this encounter: 98 kg.   Intake/Output      07 /03 0701 07/04 0700 07/04 0701 07/05 0700   I.V. (mL/kg) 87.1 (0.9)    IV Piggyback 100    Total Intake(mL/kg) 187.1 (1.9)    Urine (mL/kg/hr) 0 (0)    Other 1400    Total Output 1400    Net -1212.9           LABS  Results for orders placed or performed during the hospital encounter of 04/01/23 (from the past 24 hour(s))  Body fluid culture w Gram Stain     Status: None (Preliminary result)   Collection Time: 04/02/23  9:13 PM   Specimen: Body Fluid  Result Value Ref Range   Specimen Description FLUID SYNOVIAL RIGHT KNEE    Special Requests NONE    Gram Stain      ABUNDANT WBC PRESENT, PREDOMINANTLY PMN RARE GRAM POSITIVE COCCI INTRACELLULAR Gram Stain Report Called to,Read Back By and Verified With: RN S.ODUNTAN [id number] @ 2258 FH    Culture      RARE STAPHYLOCOCCUS AUREUS CULTURE REINCUBATED  FOR BETTER GROWTH Performed at [facility], [address], [place], [place] [address]    Report Status PENDING   Synovial cell count + diff, w/ crystals     Status: Abnormal   Collection Time: 04/02/23  9:14 PM  Result Value Ref Range   Color, Synovial AMBER (A) YELLOW   Appearance-Synovial CLOUDY (A) CLEAR   Crystals, Fluid NO CRYSTALS SEEN    WBC, Synovial 3,700 (H) 0 - 200 /cu mm   Neutrophil, Synovial 98 (H) 0 - 25 %   Lymphocytes-Synovial Fld 0 0 - 20 %   Monocyte-Macrophage-Synovial Fluid 2 (L) 50 - 90 %   Eosinophils-Synovial 0 0 - 1 %  CBC with Differential/Platelet     Status: Abnormal   Collection Time: 04/03/23  1:40 AM  Result Value Ref Range   WBC 8.8 4.0 - 10.5 K/uL   RBC 3.62 (L) 4.22 - 5.81 MIL/uL   Hemoglobin 12.1 (L) 13.0 - 17.0 g/dL   HCT 60.4 (L) 54.0 - 98.1 %   MCV 97.2 80.0 - 100.0 fL   MCH 33.4 26.0 - 34.0 pg   MCHC 34.4 30.0 - 36.0 g/dL   RDW 19.1 47.8 - 29.5 %   Platelets 87 (L) 150 - 400 K/uL   nRBC 0.0 0.0 - 0.2 %   Neutrophils Relative % 81 %   Neutro Abs 7.2 1.7 - 7.7 K/uL    Lymphocytes Relative 8 %   Lymphs Abs 0.7 0.7 - 4.0 K/uL   Monocytes Relative 9 %   Monocytes Absolute 0.8 0.1 - 1.0 K/uL   Eosinophils Relative 0 %   Eosinophils Absolute 0.0 0.0 - 0.5 K/uL   Basophils Relative 0 %   Basophils Absolute 0.0 0.0 - 0.1 K/uL   Immature Granulocytes 2 %   Abs Immature Granulocytes 0.14 (H) 0.00 - 0.07 K/uL  Brain natriuretic peptide     Status: Abnormal   Collection Time: 04/03/23  1:40 AM  Result Value Ref Range   B Natriuretic Peptide 136.6 (H) 0.0 - 100.0 pg/mL  C-reactive protein     Status: Abnormal   Collection Time: 04/03/23  1:40 AM  Result Value Ref Range   CRP 43.8 (H) <1.0 mg/dL  Procalcitonin     Status: None   Collection Time: 04/03/23  1:40 AM  Result Value Ref Range   Procalcitonin 38.83 ng/mL  APTT     Status: Abnormal   Collection Time: 04/03/23  1:40 AM  Result Value Ref Range   aPTT 71 (H) 24 - 36 seconds  Heparin level (unfractionated)     Status: Abnormal   Collection Time: 04/03/23  1:40 AM  Result Value Ref Range   Heparin Unfractionated >1.10 (H) 0.30 - 0.70 IU/mL  APTT     Status: Abnormal   Collection Time: 04/03/23  7:30 AM  Result Value Ref Range   aPTT 71 (H) 24 - 36 seconds  Blood gas, venous     Status: Abnormal   Collection Time: 04/03/23 10:53 AM  Result Value Ref Range   pH, Ven 7.51 (H) 7.25 - 7.43   pCO2, Ven 37 (L) 44 - 60 mmHg   pO2, Ven 51 (H) 32 - 45 mmHg   Bicarbonate 29.6 (H) 20.0 - 28.0 mmol/L   Acid-Base Excess 6.1 (H) 0.0 - 2.0 mmol/L   O2 Saturation 85.2 %   Patient temperature 36.7    Collection site BLOOD LEFT HAND    Drawn by [id number]  PHYSICAL EXAM:   Gen: awake but lethargic. Able to participate with exam  Lungs: unlabored Ext:       Left Upper extremity   No pain with passive ROM of L shoulder   Tolerates near full range  Unable to move shoulder actively   Motor and sensory functions intact  No erythema around shoulder        Right lower extremity   R knee with persistent  mild effusion   Bandaid in place laterally from previous aspiration    No erythema  No real tenderness with firm palpation around knee   Tolerates about a 45-50 degree arc in motion. He does lack full extension which is suspect is baseline from his severe arthritis   Ext warm    Assessment/Plan:       Anti-infectives (From admission, onward)    Start     Dose/Rate Route Frequency Ordered Stop   04/03/23 1200  nafcillin 2 g in sodium chloride 0.9 % 100 mL IVPB        2 g 216 mL/hr over 30 Minutes Intravenous Every 4 hours 04/03/23 1112     04/03/23 1145  nafcillin injection 2 g  Status:  Discontinued        2 g Intravenous Every 4 hours 04/03/23 1050 04/03/23 1112   04/02/23 1245  Ampicillin-Sulbactam (UNASYN) 3 g in sodium chloride 0.9 % 100 mL IVPB  Status:  Discontinued        3 g 200 mL/hr over 30 Minutes Intravenous Daily 04/02/23 1152 04/03/23 1050   04/01/23 2200  ceFAZolin (ANCEF) IVPB 1 g/50 mL premix  Status:  Discontinued        1 g 100 mL/hr over 30 Minutes Intravenous Every 24 hours 04/01/23 2039 04/02/23 1146   04/01/23 0830  cefTRIAXone (ROCEPHIN) 2 g in sodium chloride 0.9 % 100 mL IVPB  Status:  Discontinued        2 g 200 mL/hr over 30 Minutes Intravenous Every 24 hours 04/01/23 0823 04/01/23 2039   04/01/23 0830  azithromycin (ZITHROMAX) 500 mg in sodium chloride 0.9 % 250 mL IVPB  Status:  Discontinued        500 mg 250 mL/hr over 60 Minutes Intravenous Every 24 hours 04/01/23 0823 04/01/23 2039     . 73 y/o male with numerous medical comorbidities including ESRD on HD admitted for sepsis with R knee effusion and L shoulder effusion with acute on chronic changes   All findings discussed with extensive detail with Dr. Steward Drone   -R knee effusion, ? Septic joint  Given current labs, imaging and clinical exam it does decrease suspicion for septic joint  Will continue to follow cultures   Additionally given MRI findings of L shoulder will order IR aspiration  of L shoulder effusion and have this sent for cell count and cultures.  This will provide another data point for clinical decision making    Ice, elevate   No ROM or WB restrictions   - L shoulder effusion, RTC tear, complete tear of long head of biceps, GH arthritis   Pt reports chronic shoulder issues   Given effusion its possible there is some acute injury there as well but suspect RTC tear is somewhat chronic in nature     As above will have IR perform arthrocentesis  - ID Continue abx at the direction of the ID service    - Dispo:  Will continue to follow     Mearl Latin, PA-C  [phone] (C) 04/03/2023, 11:36 AM  [facility] [address] [place] [address] [phone] [phone] (F)    After 5pm and on the weekends please log on to Amion, go to orthopaedics and the look under the [facility] Call for the provider(s) on call. You can also call our office at [phone] and then follow the prompts to be connected to the call team.  Patient ID: Peter [name], male   DOB: [birth year], 72 y.o.   MRN: [id number]

## 2023-04-03 NOTE — Progress Notes (Addendum)
[facility] Progress Note   Subjective:  Seen in room - CCU evaluating him at time of my visit, lethargic and diaphoretic - likely going to ICU. S/p HD yesterday - went fine with 1.4L UF. Per notes, R knee aspirate Cx is positive - ID already following.  Objective Vitals:   04/02/23 2330 04/03/23 0400 04/03/23 0459 04/03/23 0800  BP: (!) 104/58 (!) 96/53 (!) 116/55   Pulse: 85 90 (!) 55   Resp: 20 20 (!) 21   Temp: 99.8 F (37.7 C) 99.5 F (37.5 C)  98.2 F (36.8 C)  TempSrc: Oral Oral  Oral  SpO2: 90% 91% 90% 93%  Weight:      Height:       Physical Exam General: Ill appearing man, lethargic and diaphoretic Heart:Irregularly irregular Lungs: CTA anteriorly, poor effort Abdomen: soft Extremities: 1+ BLE edema; 1+ RUE edema Dialysis Access: R AVG + bruit  Additional Objective Labs: Basic Metabolic Panel: Recent Labs  Lab 04/01/23 0851 04/01/23 1447 04/02/23 0555  NA 129* 127* 131*  K 5.3* 5.6* 5.2*  CL 91* 93* 90*  CO2 18* 14* 21*  GLUCOSE 136* 125* 117*  BUN 102* 104* 58*  CREATININE 13.88* 13.58* 7.96*  CALCIUM 9.8 9.1 9.1  PHOS  --   --  4.7*   Liver Function Tests: Recent Labs  Lab 04/01/23 0851 04/01/23 1447 04/02/23 0555  AST 147* 172*  --   ALT 56* 65*  --   ALKPHOS 47 39  --   BILITOT 1.1 1.0  --   PROT 7.4 6.6  --   ALBUMIN 2.8* 2.5* 2.3*   CBC: Recent Labs  Lab 04/01/23 0851 04/02/23 0340 04/03/23 0140  WBC 13.8* 9.0 8.8  NEUTROABS 12.1* 7.5 7.2  HGB 13.8 12.9* 12.1*  HCT 40.6 37.0* 35.2*  MCV 98.8 97.4 97.2  PLT 85* 77* 87*   Blood Culture    Component Value Date/Time   SDES FLUID SYNOVIAL RIGHT KNEE 04/02/2023 2113   SPECREQUEST NONE 04/02/2023 2113   CULT PENDING 04/02/2023 2113   REPTSTATUS PENDING 04/02/2023 2113   Studies/Results: VAS [place] LOWER EXTREMITY VENOUS (DVT)  Result Date: 04/02/2023  Lower Venous DVT Study Patient Name:  RAWAD REGAN  Date of Exam:   04/02/2023 Medical Rec #: [id number]           Accession #:    [id number] Date of Birth: 1950/10/31          Patient Gender: M Patient Age:   72 years Exam Location:  [facility] Procedure:      VAS [place] LOWER EXTREMITY VENOUS (DVT) Referring Phys: Kathlen Mody --------------------------------------------------------------------------------  Indications: Swelling.  Limitations: Body habitus and poor ultrasound/tissue interface. Comparison Study: Previous 01/09/18 negative. Performing Technologist: McKayla Maag RVT, [place]  Examination Guidelines: A complete evaluation includes B-mode imaging, spectral Doppler, color Doppler, and power Doppler as needed of all accessible portions of each vessel. Bilateral testing is considered an integral part of a complete examination. Limited examinations for reoccurring indications may be performed as noted. The reflux portion of the exam is performed with the patient in reverse Trendelenburg.  +-----+---------------+---------+-----------+----------+--------------+ RIGHTCompressibilityPhasicitySpontaneityPropertiesThrombus Aging +-----+---------------+---------+-----------+----------+--------------+ CFV  Full           Yes      Yes                                 +-----+---------------+---------+-----------+----------+--------------+ SFJ  Full                                                        +-----+---------------+---------+-----------+----------+--------------+   +---------+---------------+---------+-----------+----------+-------------------+  LEFT     CompressibilityPhasicitySpontaneityPropertiesThrombus Aging      +---------+---------------+---------+-----------+----------+-------------------+ CFV      Full           Yes      Yes                                      +---------+---------------+---------+-----------+----------+-------------------+ SFJ      Full                                                              +---------+---------------+---------+-----------+----------+-------------------+ FV Prox  Full                                                             +---------+---------------+---------+-----------+----------+-------------------+ FV Mid   Full                                                             +---------+---------------+---------+-----------+----------+-------------------+ FV DistalFull                                                             +---------+---------------+---------+-----------+----------+-------------------+ PFV      Full                                                             +---------+---------------+---------+-----------+----------+-------------------+ POP      Full           Yes      Yes                                      +---------+---------------+---------+-----------+----------+-------------------+ PTV      Full                                                             +---------+---------------+---------+-----------+----------+-------------------+ PERO                                                  Not well visualized +---------+---------------+---------+-----------+----------+-------------------+    Summary: RIGHT: - No evidence of common  femoral vein obstruction.  LEFT: - There is no evidence of deep vein thrombosis in the lower extremity. However, portions of this examination were limited- see technologist comments above.  - No cystic structure found in the popliteal fossa.  *See table(s) above for measurements and observations. Electronically signed by Coral Else MD on 04/02/2023 at 9:15:20 PM.    Final    CT ABDOMEN PELVIS W CONTRAST  Result Date: 04/02/2023 CLINICAL DATA:  Abdominal pain, acute, nonlocalized. End stage renal disease. Nausea and vomiting. Sepsis. EXAM: CT ABDOMEN AND PELVIS WITH CONTRAST TECHNIQUE: Multidetector CT imaging of the abdomen and pelvis was performed using the standard protocol  following bolus administration of intravenous contrast. RADIATION DOSE REDUCTION: This exam was performed according to the departmental dose-optimization program which includes automated exposure control, adjustment of the mA and/or kV according to patient size and/or use of iterative reconstruction technique. CONTRAST:  75mL OMNIPAQUE IOHEXOL 350 MG/ML SOLN COMPARISON:  04/05/2009 FINDINGS: Lower chest: Mild dependent atelectasis at the lung bases. Tiny amount of pleural fluid. Non dependent lung as well aerated. There is cardiomegaly and coronary artery calcification. Hepatobiliary: No liver parenchymal abnormality. No calcified gallstones. No ductal dilatation. Pancreas: Normal Spleen: Normal Adrenals/Urinary Tract: Adrenal glands are normal. Atrophic kidneys, more so on the left than the right. No hydroureteronephrosis. No stone or mass. Bladder is not well seen because of artifact from a left hip replacement. No bladder distension. Stomach/Bowel: Stomach and small intestine are normal. Normal appendix. No abnormal colon finding. Diverticulosis without evidence of diverticulitis. Vascular/Lymphatic: Aorta and IVC are normal.  No adenopathy. Reproductive: Normal Other: No free fluid or air. Musculoskeletal: Ordinary mild degenerative change affects the spine. IMPRESSION: 1. No acute finding by CT. 2. Atrophic kidneys, more so on the left than the right. 3. Diverticulosis without evidence of diverticulitis. 4. Cardiomegaly and coronary artery calcification. 5. Mild dependent atelectasis at the lung bases. Tiny amount of pleural fluid. Electronically Signed   By: Paulina Fusi M.D.   On: 04/02/2023 13:01   DG Abd Portable 1V  Result Date: 04/02/2023 CLINICAL DATA:  Nausea and vomiting EXAM: PORTABLE ABDOMEN - 1 VIEW COMPARISON:  Abdominal CT 04/05/2009 FINDINGS: Normal bowel gas pattern. Stool at the rectum without generalized abnormal stool retention. No concerning mass effect or calcification. Cardiomegaly with  clear lung bases. Generalized spondylosis. Unremarkable left hip replacement. IMPRESSION: Normal bowel gas pattern. Electronically Signed   By: Tiburcio Pea M.D.   On: 04/02/2023 06:59   DG Chest Port 1 View  Result Date: 04/02/2023 CLINICAL DATA:  Shortness of breath EXAM: PORTABLE CHEST 1 VIEW COMPARISON:  04/01/2023 FINDINGS: Chronic cardiomegaly. Stable mediastinal contours. Low volume but clear lungs with no effusion or pneumothorax. Artifact from EKG leads. IMPRESSION: Stable low volume chest with cardiomegaly. Electronically Signed   By: Tiburcio Pea M.D.   On: 04/02/2023 06:58   DG Shoulder Left  Result Date: 04/01/2023 CLINICAL DATA:  Pain after fall EXAM: LEFT SHOULDER - 2 VIEW COMPARISON:  None Available. FINDINGS: Osteopenia. Osteophyte formation of the glenohumeral joint and AC joint with some joint space loss. There is also elevation of the humeral head relative to the acromion. Please correlate for a full-thickness rotator cuff tear. No acute fracture or dislocation. Surgical clips in the left axillary region. IMPRESSION: Moderate degenerative changes. High-riding humeral head. Please correlate for a full-thickness rotator cuff tear Electronically Signed   By: Karen Kays M.D.   On: 04/01/2023 11:04   DG Knee Complete 4 Views Right  Result  Date: 04/01/2023 CLINICAL DATA:  Pain after fall EXAM: RIGHT KNEE - COMPLETE 4 VIEW COMPARISON:  None Available. FINDINGS: Moderate joint space loss of the medial compartment. Small osteophytes seen of all 3 compartments greatest of the patellofemoral joint. There is a moderate joint effusion. Hyperostosis. Osteopenia. No fracture or dislocation. IMPRESSION: Tricompartment degenerative changes identified, overall moderate greatest involving the medial compartment and patellofemoral joint. Moderate joint effusion of uncertain etiology. This has a broad differential. This could be related to the degenerative changes. However please correlate for  history of trauma or infection. Electronically Signed   By: Karen Kays M.D.   On: 04/01/2023 11:03   DG Knee Complete 4 Views Left  Result Date: 04/01/2023 CLINICAL DATA:  Pain after fall.  Weakness for 2 days EXAM: LEFT KNEE - COMPLETE 4 VIEW COMPARISON:  None Available. FINDINGS: Osteopenia. Slight joint space loss of the medial compartment. Small osteophytes of all 3 compartments. No joint effusion on lateral view. No fracture or dislocation. Hyperostosis of the patella. Vascular calcifications. IMPRESSION: Mild degenerative changes.  No joint effusion. Electronically Signed   By: Karen Kays M.D.   On: 04/01/2023 11:00   CT Head Wo Contrast  Result Date: 04/01/2023 CLINICAL DATA:  Provided history: Polytrauma, blunt. EXAM: CT HEAD WITHOUT CONTRAST CT CERVICAL SPINE WITHOUT CONTRAST TECHNIQUE: Multidetector CT imaging of the head and cervical spine was performed following the standard protocol without intravenous contrast. Multiplanar CT image reconstructions of the cervical spine were also generated. RADIATION DOSE REDUCTION: This exam was performed according to the departmental dose-optimization program which includes automated exposure control, adjustment of the mA and/or kV according to patient size and/or use of iterative reconstruction technique. COMPARISON:  None. FINDINGS: CT HEAD FINDINGS Brain: Mild generalized parenchymal atrophy. There is no acute intracranial hemorrhage. No demarcated cortical infarct. No extra-axial fluid collection. No evidence of an intracranial mass. No midline shift. Vascular: No hyperdense vessel.  Atherosclerotic calcifications. Skull: No calvarial fracture or aggressive osseous lesion. Sinuses/Orbits: No mass or acute finding within the imaged orbits. Mild-to-moderate mucosal thickening, and small volume frothy secretions, within the right maxillary sinus. Small mucous retention cyst within the left maxillary sinus. CT CERVICAL SPINE FINDINGS Alignment: No  significant spondylolisthesis. Skull base and vertebrae: The basion-dental and atlanto-dental intervals are maintained.No evidence of acute fracture to the cervical spine. Soft tissues and spinal canal: No prevertebral fluid or swelling. No visible canal hematoma. Disc levels: Cervical spondylosis with multilevel disc space narrowing, disc bulges/central disc protrusions, endplate spurring, uncovertebral hypertrophy and facet arthrosis. Disc space narrowing is greatest at C5-C6 (moderate/advanced), C7-T1 (moderate/advanced), T1-T2 (advanced) and T2-T3 (advanced). 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 Most notably at C2-C3 and C3-C4, central disc protrusions contribute to at least moderate spinal canal stenosis. Multilevel bony neural foraminal narrowing. Upper chest: No consolidation within the imaged lung apices. No visible pneumothorax. IMPRESSION: CT head: 1.  No evidence of an acute intracranial abnormality. 2. Mild generalized parenchymal atrophy. 3. Paranasal sinus disease as described. CT cervical spine: 1. No evidence of an acute cervical spine fracture. 2. Cervical spondylosis as outlined. Notably at C2-C3 and C3-C4, central disc protrusions contribute to at least moderate spinal canal stenosis. Multilevel bony neural from narrowing. Electronically Signed   By: Jackey Loge D.O.   On: 04/01/2023 10:32   CT Cervical Spine Wo Contrast  Result Date: 04/01/2023 CLINICAL DATA:  Provided history: Polytrauma, blunt. EXAM: CT HEAD WITHOUT CONTRAST CT CERVICAL SPINE WITHOUT CONTRAST TECHNIQUE: Multidetector  CT imaging of the head and cervical spine was performed following the standard protocol without intravenous contrast. Multiplanar CT image reconstructions of the cervical spine were also generated. RADIATION DOSE REDUCTION: This exam was performed according to the departmental dose-optimization program which includes automated exposure control,  adjustment of the mA and/or kV according to patient size and/or use of iterative reconstruction technique. COMPARISON:  None. FINDINGS: CT HEAD FINDINGS Brain: Mild generalized parenchymal atrophy. There is no acute intracranial hemorrhage. No demarcated cortical infarct. No extra-axial fluid collection. No evidence of an intracranial mass. No midline shift. Vascular: No hyperdense vessel.  Atherosclerotic calcifications. Skull: No calvarial fracture or aggressive osseous lesion. Sinuses/Orbits: No mass or acute finding within the imaged orbits. Mild-to-moderate mucosal thickening, and small volume frothy secretions, within the right maxillary sinus. Small mucous retention cyst within the left maxillary sinus. CT CERVICAL SPINE FINDINGS Alignment: No significant spondylolisthesis. Skull base and vertebrae: The basion-dental and atlanto-dental intervals are maintained.No evidence of acute fracture to the cervical spine. Soft tissues and spinal canal: No prevertebral fluid or swelling. No visible canal hematoma. Disc levels: Cervical spondylosis with multilevel disc space narrowing, disc bulges/central disc protrusions, endplate spurring, uncovertebral hypertrophy and facet arthrosis. Disc space narrowing is greatest at C5-C6 (moderate/advanced), C7-T1 (moderate/advanced), T1-T2 (advanced) and T2-T3 (advanced). 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 Most notably at C2-C3 and C3-C4, central disc protrusions contribute to at least moderate spinal canal stenosis. Multilevel bony neural foraminal narrowing. Upper chest: No consolidation within the imaged lung apices. No visible pneumothorax. IMPRESSION: CT head: 1.  No evidence of an acute intracranial abnormality. 2. Mild generalized parenchymal atrophy. 3. Paranasal sinus disease as described. CT cervical spine: 1. No evidence of an acute cervical spine fracture. 2. Cervical spondylosis as outlined. Notably at  C2-C3 and C3-C4, central disc protrusions contribute to at least moderate spinal canal stenosis. Multilevel bony neural from narrowing. Electronically Signed   By: Jackey Loge D.O.   On: 04/01/2023 10:32   Medications:  ampicillin-sulbactam (UNASYN) IV 3 g (04/03/23 1610)   heparin 1,200 Units/hr (04/02/23 2143)    (feeding supplement) PROSource Plus  30 mL Oral BID BM   atorvastatin  10 mg Oral QHS   bupivacaine(PF)  10 mL Infiltration Once   Chlorhexidine Gluconate Cloth  6 each Topical Daily   Chlorhexidine Gluconate Cloth  6 each Topical Q0600   cinacalcet  180 mg Oral Q breakfast   diltiazem  60 mg Oral Q12H   ferric citrate  630 mg Oral TID WC   multivitamin  1 tablet Oral QHS   pantoprazole (PROTONIX) IV  40 mg Intravenous Q12H   Assessment/ Plan: Pt is a 72 y.o. yo male with ESRD, T2DM, HTN, OSA, A-fib (on Eliquis), HL who was admitted with pneumonia.    Dialysis Orders:  MWF at [facility] 4hr, 450/800, EDW 97.5kg, 3K/2Ca, AVG, 15g, heparin 10,000 bolus - no ESA, Hgb > 12 - No VDRA (recently stopped d/t Ca 10.9)   # Sepsis due to aspiration pneumonia, septic joint: Currently on antibiotics, requiring oxygenation. Per primary team.  # MSSA bacteremia: 1st set Blood Cx 7/2 positive, repeat last that day negative ? ID following. On Unasyn. AVG without signs of infection.  # R knee aspirate with ^ WBC: Likely septic arthritis   # ESRD: Continue HD on usual MWF schedule.  Next HD 7/5.    # Anemia of CKD: Hemoglobin above goal.  No need for ESA.   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  Continue Sensipar and Auryxia.  Monitor lab.   # HTN/volume: BP okay.  Off of antihypertensives. Does have edema.  # Elevated LFTs  # A-Fib  Ozzie Hoyle, PA-C 04/03/2023, 9:17 AM  [facility]  Nephrology attending: I have personally seen and examined the patient.  Chart reviewed.  I agree with above.  Completed dialysis yesterday.  He has AMS with  encephalopathy possibly due to sepsis.  Undergoing evaluation for septic arthritis, covered with antibiotics.  Ortho is following.  Discussed with multiple family members at the bedside today.  Plan for regular dialysis tomorrow.  Eddie North, [facility].

## 2023-04-03 NOTE — Significant Event (Signed)
Patient currently admitted with MSSA bacteremia. Right knee aspiration with a cell count of 3700. Culture being reincubated with possible SA. Given his severe osteoarthritis right knee with his improved knee pain and low cell count (lower suspicion for septic arthritis) will plan for left shoulder aspiration. May ultimately require intervention for his shoulder and knee but given his right knee arthritis and very low cell count, would wait on shoulder aspiration to minimize possible operative trips.

## 2023-04-03 NOTE — Consult Note (Signed)
Subjective:   He is complaining of left shoulder pain right knee pain and right wrist pain  Antibiotics:  Anti-infectives (From admission, onward)    Start     Dose/Rate Route Frequency Ordered Stop   04/03/23 1200  nafcillin 2 g in sodium chloride 0.9 % 100 mL IVPB        2 g 216 mL/hr over 30 Minutes Intravenous Every 4 hours 04/03/23 1112     04/03/23 1145  nafcillin injection 2 g  Status:  Discontinued        2 g Intravenous Every 4 hours 04/03/23 1050 04/03/23 1112   04/02/23 1245  Ampicillin-Sulbactam (UNASYN) 3 g in sodium chloride 0.9 % 100 mL IVPB  Status:  Discontinued        3 g 200 mL/hr over 30 Minutes Intravenous Daily 04/02/23 1152 04/03/23 1050   04/01/23 2200  ceFAZolin (ANCEF) IVPB 1 g/50 mL premix  Status:  Discontinued        1 g 100 mL/hr over 30 Minutes Intravenous Every 24 hours 04/01/23 2039 04/02/23 1146   04/01/23 0830  cefTRIAXone (ROCEPHIN) 2 g in sodium chloride 0.9 % 100 mL IVPB  Status:  Discontinued        2 g 200 mL/hr over 30 Minutes Intravenous Every 24 hours 04/01/23 0823 04/01/23 2039   04/01/23 0830  azithromycin (ZITHROMAX) 500 mg in sodium chloride 0.9 % 250 mL IVPB  Status:  Discontinued        500 mg 250 mL/hr over 60 Minutes Intravenous Every 24 hours 04/01/23 0823 04/01/23 2039       Medications: Scheduled Meds:  (feeding supplement) PROSource Plus  30 mL Oral BID BM   atorvastatin  10 mg Oral QHS   bupivacaine(PF)  10 mL Infiltration Once   Chlorhexidine Gluconate Cloth  6 each Topical Daily   Chlorhexidine Gluconate Cloth  6 each Topical Q0600   cinacalcet  180 mg Oral Q breakfast   diltiazem  60 mg Oral Q12H   ferric citrate  630 mg Oral TID WC   multivitamin  1 tablet Oral QHS   pantoprazole (PROTONIX) IV  40 mg Intravenous Q12H   Continuous Infusions:  heparin 1,200 Units/hr (04/02/23 2143)   nafcillin IV 2 g (04/03/23 1243)   PRN Meds:.acetaminophen, diltiazem, lidocaine (PF), mouth  rinse    Objective: Weight change: -0.338 kg  Intake/Output Summary (Last 24 hours) at 04/03/2023 1416 Last data filed at 04/03/2023 0500 Gross per 24 hour  Intake 187.07 ml  Output 1400 ml  Net -1212.93 ml   Blood pressure (!) 120/108, pulse 85, temperature 98.5 F (36.9 C), temperature source Oral, resp. rate (!) 23, height 5\' 6"  (1.676 m), weight 98 kg, SpO2 94 %. Temp:  [98 F (36.7 C)-99.9 F (37.7 C)] 98.5 F (36.9 C) (07/04 1310) Pulse Rate:  [9-112] 85 (07/04 1310) Resp:  [20-29] 23 (07/04 1310) BP: (72-147)/(44-119) 120/108 (07/04 1310) SpO2:  [90 %-98 %] 94 % (07/04 1310) Weight:  [98 kg-99 kg] 98 kg (07/03 1908)  Physical Exam: Physical Exam Constitutional:      Appearance: He is well-developed. He is ill-appearing.  HENT:     Head: Normocephalic and atraumatic.  Eyes:     Conjunctiva/sclera: Conjunctivae normal.  Cardiovascular:     Rate and Rhythm: Normal rate and regular rhythm.  Pulmonary:     Effort: Pulmonary effort is normal. No respiratory distress.     Breath sounds: Normal breath sounds.  No stridor. No wheezing.  Abdominal:     General: There is no distension.     Palpations: Abdomen is soft.  Musculoskeletal:     Right shoulder: Swelling, effusion and tenderness present. Decreased range of motion.     Right wrist: Swelling and tenderness present. Decreased range of motion.     Cervical back: Normal range of motion and neck supple.     Right knee: Swelling and effusion present. Decreased range of motion. Tenderness present.  Skin:    General: Skin is warm and dry.     Findings: No erythema or rash.  Neurological:     General: No focal deficit present.     Mental Status: He is alert and oriented to person, place, and time.  Psychiatric:        Mood and Affect: Mood normal.        Speech: Speech is delayed.        Behavior: Behavior normal. Behavior is cooperative.        Thought Content: Thought content normal.        Cognition and Memory:  Cognition is impaired. Memory is impaired.      CBC:    BMET Recent Labs    04/01/23 1447 04/02/23 0555  NA 127* 131*  K 5.6* 5.2*  CL 93* 90*  CO2 14* 21*  GLUCOSE 125* 117*  BUN 104* 58*  CREATININE 13.58* 7.96*  CALCIUM 9.1 9.1     Liver Panel  Recent Labs    04/01/23 1447 04/02/23 0555 04/03/23 1058  PROT 6.6  --  6.5  ALBUMIN 2.5* 2.3* 1.9*  AST 172*  --  158*  ALT 65*  --  76*  ALKPHOS 39  --  45  BILITOT 1.0  --  1.3*  BILIDIR  --   --  0.4*  IBILI  --   --  0.9       Sedimentation Rate No results for input(s): "ESRSEDRATE" in the last 72 hours. C-Reactive Protein Recent Labs    04/02/23 0556 04/03/23 0140  CRP 43.0* 43.8*    Micro Results: Recent Results (from the past 720 hour(s))  Resp panel by RT-PCR (RSV, Flu A&B, Covid) Anterior Nasal Swab     Status: None   Collection Time: 04/01/23  8:23 AM   Specimen: Anterior Nasal Swab  Result Value Ref Range Status   SARS Coronavirus 2 by RT PCR NEGATIVE NEGATIVE Final   Influenza A by PCR NEGATIVE NEGATIVE Final   Influenza B by PCR NEGATIVE NEGATIVE Final    Comment: (NOTE) The Xpert Xpress SARS-CoV-2/FLU/RSV plus assay is intended as an aid in the diagnosis of influenza from Nasopharyngeal swab specimens and should not be used as a sole basis for treatment. Nasal washings and aspirates are unacceptable for Xpert Xpress SARS-CoV-2/FLU/RSV testing.  Fact Sheet for Patients: BloggerCourse.com  Fact Sheet for Healthcare Providers: SeriousBroker.it  This test is not yet approved or cleared by the [place] FDA and has been authorized for detection and/or diagnosis of SARS-CoV-2 by FDA under an Emergency Use Authorization (EUA). This EUA will remain in effect (meaning this test can be used) for the duration of the COVID-19 declaration under Section 564(b)(1) of the Act, 21 U.S.C. section 360bbb-3(b)(1), unless the authorization is  terminated or revoked.     Resp Syncytial Virus by PCR NEGATIVE NEGATIVE Final    Comment: (NOTE) Fact Sheet for Patients: BloggerCourse.com  Fact Sheet for Healthcare Providers: SeriousBroker.it  This test is not yet  approved or cleared by the [place] and has been authorized for detection and/or diagnosis of SARS-CoV-2 by FDA under an Emergency Use Authorization (EUA). This EUA will remain in effect (meaning this test can be used) for the duration of the COVID-19 declaration under Section 564(b)(1) of the Act, 21 U.S.C. section 360bbb-3(b)(1), unless the authorization is terminated or revoked.  Performed at [facility], [address], [place], [place] [address]   Blood Culture (routine x 2)     Status: Abnormal   Collection Time: 04/01/23  8:51 AM   Specimen: BLOOD LEFT FOREARM  Result Value Ref Range Status   Specimen Description BLOOD LEFT FOREARM  Final   Special Requests   Final    BOTTLES DRAWN AEROBIC AND ANAEROBIC Blood Culture results may not be optimal due to an excessive volume of blood received in culture bottles   Culture  Setup Time   Final    GRAM POSITIVE COCCI IN CLUSTERS IN BOTH AEROBIC AND ANAEROBIC BOTTLES CRITICAL RESULT CALLED TO, READ BACK BY AND VERIFIED WITH: PHARMD HAILEY VONDOLEN ON 04/01/23 @ 2017 BY DRT Performed at [facility], [address] [place], [place] [address]    Culture STAPHYLOCOCCUS AUREUS (A)  Final   Report Status 04/03/2023 FINAL  Final   Organism ID, Bacteria STAPHYLOCOCCUS AUREUS  Final      Susceptibility   Staphylococcus aureus - MIC*    CIPROFLOXACIN <=0.5 SENSITIVE Sensitive     ERYTHROMYCIN <=0.25 SENSITIVE Sensitive     GENTAMICIN <=0.5 SENSITIVE Sensitive     OXACILLIN <=0.25 SENSITIVE Sensitive     TETRACYCLINE <=1 SENSITIVE Sensitive     VANCOMYCIN 1 SENSITIVE Sensitive     TRIMETH/SULFA <=10 SENSITIVE Sensitive     CLINDAMYCIN <=0.25  SENSITIVE Sensitive     RIFAMPIN <=0.5 SENSITIVE Sensitive     Inducible Clindamycin NEGATIVE Sensitive     LINEZOLID 2 SENSITIVE Sensitive     * STAPHYLOCOCCUS AUREUS  Blood Culture ID Panel (Reflexed)     Status: Abnormal   Collection Time: 04/01/23  8:51 AM  Result Value Ref Range Status   Enterococcus faecalis NOT DETECTED NOT DETECTED Final   Enterococcus Faecium NOT DETECTED NOT DETECTED Final   Listeria monocytogenes NOT DETECTED NOT DETECTED Final   Staphylococcus species DETECTED (A) NOT DETECTED Final    Comment: CRITICAL RESULT CALLED TO, READ BACK BY AND VERIFIED WITH: PHARMD HAILEY VONDOLEN ON 04/01/23 @ 2017 BY DRT    Staphylococcus aureus (BCID) DETECTED (A) NOT DETECTED Final    Comment: CRITICAL RESULT CALLED TO, READ BACK BY AND VERIFIED WITH: PHARMD HAILEY VONDOLEN ON 04/01/23 @ 2017 BY DRT    Staphylococcus epidermidis NOT DETECTED NOT DETECTED Final   Staphylococcus lugdunensis NOT DETECTED NOT DETECTED Final   Streptococcus species NOT DETECTED NOT DETECTED Final   Streptococcus agalactiae NOT DETECTED NOT DETECTED Final   Streptococcus pneumoniae NOT DETECTED NOT DETECTED Final   Streptococcus pyogenes NOT DETECTED NOT DETECTED Final   A.calcoaceticus-baumannii NOT DETECTED NOT DETECTED Final   Bacteroides fragilis NOT DETECTED NOT DETECTED Final   Enterobacterales NOT DETECTED NOT DETECTED Final   Enterobacter cloacae complex NOT DETECTED NOT DETECTED Final   Escherichia coli NOT DETECTED NOT DETECTED Final   Klebsiella aerogenes NOT DETECTED NOT DETECTED Final   Klebsiella oxytoca NOT DETECTED NOT DETECTED Final   Klebsiella pneumoniae NOT DETECTED NOT DETECTED Final   Proteus species NOT DETECTED NOT DETECTED Final   Salmonella species NOT DETECTED NOT  DETECTED Final   Serratia marcescens NOT DETECTED NOT DETECTED Final   Haemophilus influenzae NOT DETECTED NOT DETECTED Final   Neisseria meningitidis NOT DETECTED NOT DETECTED Final   Pseudomonas aeruginosa  NOT DETECTED NOT DETECTED Final   Stenotrophomonas maltophilia NOT DETECTED NOT DETECTED Final   Candida albicans NOT DETECTED NOT DETECTED Final   Candida auris NOT DETECTED NOT DETECTED Final   Candida glabrata NOT DETECTED NOT DETECTED Final   Candida krusei NOT DETECTED NOT DETECTED Final   Candida parapsilosis NOT DETECTED NOT DETECTED Final   Candida tropicalis NOT DETECTED NOT DETECTED Final   Cryptococcus neoformans/gattii NOT DETECTED NOT DETECTED Final   Meth resistant mecA/C and MREJ NOT DETECTED NOT DETECTED Final    Comment: Performed at [facility], [address] [place], [place] [address]  Blood Culture (routine x 2)     Status: None (Preliminary result)   Collection Time: 04/01/23  1:09 PM   Specimen: BLOOD LEFT HAND  Result Value Ref Range Status   Specimen Description BLOOD LEFT HAND  Final   Special Requests   Final    BOTTLES DRAWN AEROBIC AND ANAEROBIC Blood Culture adequate volume   Culture   Final    NO GROWTH 2 DAYS Performed at [facility], [address] [place], [place] [address]    Report Status PENDING  Incomplete  Culture, blood (Routine X 2) w Reflex to ID Panel     Status: None (Preliminary result)   Collection Time: 04/01/23 11:15 PM   Specimen: BLOOD  Result Value Ref Range Status   Specimen Description BLOOD BLOOD LEFT ARM  Final   Special Requests   Final    BOTTLES DRAWN AEROBIC AND ANAEROBIC Blood Culture results may not be optimal due to an inadequate volume of blood received in culture bottles   Culture   Final    NO GROWTH 2 DAYS Performed at [facility], [address], [place], [place] [address]    Report Status PENDING  Incomplete  Culture, blood (Routine X 2) w Reflex to ID Panel     Status: None (Preliminary result)   Collection Time: 04/01/23 11:21 PM   Specimen: BLOOD  Result Value Ref Range Status   Specimen Description BLOOD BLOOD LEFT HAND  Final   Special Requests   Final    BOTTLES DRAWN AEROBIC AND  ANAEROBIC Blood Culture results may not be optimal due to an inadequate volume of blood received in culture bottles   Culture   Final    NO GROWTH 2 DAYS Performed at [facility], [address] [place], [place] [address]    Report Status PENDING  Incomplete  MRSA Next Gen by PCR, Nasal     Status: None   Collection Time: 04/02/23  5:42 AM   Specimen: Nasal Mucosa; Nasal Swab  Result Value Ref Range Status   MRSA by PCR Next Gen NOT DETECTED NOT DETECTED Final    Comment: (NOTE) The GeneXpert MRSA Assay (FDA approved for NASAL specimens only), is one component of a comprehensive MRSA colonization surveillance program. It is not intended to diagnose MRSA infection nor to guide or monitor treatment for MRSA infections. Test performance is not FDA approved in patients less than 77 years old. Performed at [facility], [address], [place], [place] [address]   Body fluid culture w Gram Stain     Status: None (Preliminary result)   Collection Time: 04/02/23  9:13  PM   Specimen: Body Fluid  Result Value Ref Range Status   Specimen Description FLUID SYNOVIAL RIGHT KNEE  Final   Special Requests NONE  Final   Gram Stain   Final    ABUNDANT WBC PRESENT, PREDOMINANTLY PMN RARE GRAM POSITIVE COCCI INTRACELLULAR Gram Stain Report Called to,Read Back By and Verified With: RN [email or web address] [id number] @ 2258 FH    Culture   Final    RARE STAPHYLOCOCCUS AUREUS CULTURE REINCUBATED FOR BETTER GROWTH Performed at [facility], [address] [place], [place] [address]    Report Status PENDING  Incomplete    Studies/Results: MR SHOULDER LEFT WO CONTRAST  Result Date: 04/03/2023 CLINICAL DATA:  Left shoulder pain. EXAM: MRI OF THE LEFT SHOULDER WITHOUT CONTRAST TECHNIQUE: Multiplanar, multisequence MR imaging of the shoulder was performed. No intravenous contrast was administered. COMPARISON:  None Available. FINDINGS: Rotator cuff: Complete tear of the supraspinatus and  infraspinatus tendons with 5.7 cm of retraction. Teres minor tendon is intact. Moderate tendinosis of the subscapularis tendon with a small partial-thickness tear. Muscles: Severe atrophy of the supraspinatus, infraspinatus and subscapularis muscles. Biceps Long Head: Complete tear of the proximal long head of the biceps tendon. Acromioclavicular Joint: Moderate arthropathy of the acromioclavicular joint. Small amount of subacromial/subdeltoid bursal fluid. Glenohumeral Joint: Large joint effusion. Partial-thickness cartilage loss of the glenohumeral joint with areas of high-grade partial-thickness cartilage loss. Labrum: Generalized labral degeneration. Bones: No fracture or dislocation. No aggressive osseous lesion. Other: No fluid collection or hematoma. Muscle edema in the short of the biceps muscle and coracobrachialis muscle concerning for muscle strain. IMPRESSION: 1. Complete tear of the supraspinatus and infraspinatus tendons with 5.7 cm of retraction. 2. Moderate tendinosis of the subscapularis tendon with a small partial-thickness tear. 3. Complete tear of the proximal long head of the biceps tendon. 4. Moderate osteoarthritis of the glenohumeral joint. Large glenohumeral joint effusion. Electronically Signed   By: Elige Ko M.D.   On: 04/03/2023 10:45   MR CERVICAL SPINE WO CONTRAST  Result Date: 04/03/2023 CLINICAL DATA:  In stage renal disease, cervical bone lesion. EXAM: MRI CERVICAL SPINE WITHOUT CONTRAST TECHNIQUE: Multiplanar, multisequence MR imaging of the cervical spine was performed. No intravenous contrast was administered. COMPARISON:  CT cervical spine 04/01/2023 FINDINGS: Alignment: Physiologic. Vertebrae: No acute fracture, evidence of discitis, or aggressive bone lesion. Cord: Normal signal and morphology. Posterior Fossa, vertebral arteries, paraspinal tissues: Posterior fossa demonstrates no focal abnormality. Vertebral artery flow voids are maintained. Paraspinal soft tissues are  unremarkable. Disc levels: Discs: Degenerative disease with disc height loss at C4-5 and C5-6. Degenerative disease with disc height loss at C7-T1, T1-2 and T2-3. C2-3: Small central disc protrusion. Moderate right and mild left facet arthropathy. Mild bilateral foraminal stenosis. No spinal stenosis. C3-4: Broad-based disc bulge. Broad-based disc bulge with a small central disc protrusion. Moderate bilateral facet arthropathy. No severe bilateral foraminal stenosis. Severe spinal stenosis. C4-5: Broad-based disc bulge with a broad central disc protrusion. Severe spinal stenosis. Severe bilateral foraminal stenosis. C5-6: Mild broad-based disc bulge. Moderate right facet arthropathy. Severe bilateral foraminal stenosis. Mild spinal stenosis. C6-7: Mild broad-based disc bulge. Mild bilateral facet arthropathy. Severe right foraminal stenosis. Moderate left foraminal stenosis. No spinal stenosis. C7-T1: Minimal broad-based disc bulge. No foraminal or central canal stenosis. T1-2: Broad-based disc bulge. Moderate bilateral foraminal stenosis. No spinal stenosis. Bilateral facet arthropathy. T2-3: Broad-based disc bulge. Bilateral facet arthropathy. Mild bilateral foraminal stenosis. IMPRESSION: 1. Diffuse cervical spine spondylosis as described above. 2. No acute  osseous injury of the cervical spine. 3. No aggressive osseous lesion of the cervical spine. 4. At C3-4 there is a broad-based disc bulge with a small central disc protrusion. Moderate bilateral facet arthropathy. No severe bilateral foraminal stenosis. Severe spinal stenosis. 5. At C4-5 there is a broad-based disc bulge with a broad central disc protrusion. Severe spinal stenosis. Severe bilateral foraminal stenosis. 6. At C5-6 there is a mild broad-based disc bulge. Moderate right facet arthropathy. Severe bilateral foraminal stenosis. Mild spinal stenosis. 7. At C6-7 there is a mild broad-based disc bulge. Mild bilateral facet arthropathy. Severe right  foraminal stenosis. Moderate left foraminal stenosis. Electronically Signed   By: Elige Ko M.D.   On: 04/03/2023 10:40   VAS [place] LOWER EXTREMITY VENOUS (DVT)  Result Date: 04/02/2023  Lower Venous DVT Study Patient Name:  KYLLE FOSKETT  Date of Exam:   04/02/2023 Medical Rec #: [id number]          Accession #:    [id number] Date of Birth: [birth year]          Patient Gender: M Patient Age:   72 years Exam Location:  [facility] Procedure:      VAS [place] LOWER EXTREMITY VENOUS (DVT) Referring Phys: Kathlen Mody --------------------------------------------------------------------------------  Indications: Swelling.  Limitations: Body habitus and poor ultrasound/tissue interface. Comparison Study: Previous 01/09/18 negative. Performing Technologist: McKayla Maag RVT, [place]  Examination Guidelines: A complete evaluation includes B-mode imaging, spectral Doppler, color Doppler, and power Doppler as needed of all accessible portions of each vessel. Bilateral testing is considered an integral part of a complete examination. Limited examinations for reoccurring indications may be performed as noted. The reflux portion of the exam is performed with the patient in reverse Trendelenburg.  +-----+---------------+---------+-----------+----------+--------------+ RIGHTCompressibilityPhasicitySpontaneityPropertiesThrombus Aging +-----+---------------+---------+-----------+----------+--------------+ CFV  Full           Yes      Yes                                 +-----+---------------+---------+-----------+----------+--------------+ SFJ  Full                                                        +-----+---------------+---------+-----------+----------+--------------+   +---------+---------------+---------+-----------+----------+-------------------+ LEFT     CompressibilityPhasicitySpontaneityPropertiesThrombus Aging       +---------+---------------+---------+-----------+----------+-------------------+ CFV      Full           Yes      Yes                                      +---------+---------------+---------+-----------+----------+-------------------+ SFJ      Full                                                             +---------+---------------+---------+-----------+----------+-------------------+ FV Prox  Full                                                             +---------+---------------+---------+-----------+----------+-------------------+  FV Mid   Full                                                             +---------+---------------+---------+-----------+----------+-------------------+ FV DistalFull                                                             +---------+---------------+---------+-----------+----------+-------------------+ PFV      Full                                                             +---------+---------------+---------+-----------+----------+-------------------+ POP      Full           Yes      Yes                                      +---------+---------------+---------+-----------+----------+-------------------+ PTV      Full                                                             +---------+---------------+---------+-----------+----------+-------------------+ PERO                                                  Not well visualized +---------+---------------+---------+-----------+----------+-------------------+    Summary: RIGHT: - No evidence of common femoral vein obstruction.  LEFT: - There is no evidence of deep vein thrombosis in the lower extremity. However, portions of this examination were limited- see technologist comments above.  - No cystic structure found in the popliteal fossa.  *See table(s) above for measurements and observations. Electronically signed by Coral Else MD on 04/02/2023 at 9:15:20 PM.     Final    CT ABDOMEN PELVIS W CONTRAST  Result Date: 04/02/2023 CLINICAL DATA:  Abdominal pain, acute, nonlocalized. End stage renal disease. Nausea and vomiting. Sepsis. EXAM: CT ABDOMEN AND PELVIS WITH CONTRAST TECHNIQUE: Multidetector CT imaging of the abdomen and pelvis was performed using the standard protocol following bolus administration of intravenous contrast. RADIATION DOSE REDUCTION: This exam was performed according to the departmental dose-optimization program which includes automated exposure control, adjustment of the mA and/or kV according to patient size and/or use of iterative reconstruction technique. CONTRAST:  75mL OMNIPAQUE IOHEXOL 350 MG/ML SOLN COMPARISON:  04/05/2009 FINDINGS: Lower chest: Mild dependent atelectasis at the lung bases. Tiny amount of pleural fluid. Non dependent lung as well aerated. There is cardiomegaly and coronary artery calcification. Hepatobiliary: No liver parenchymal abnormality. No calcified gallstones. No ductal dilatation. Pancreas: Normal Spleen: Normal Adrenals/Urinary Tract: Adrenal  glands are normal. Atrophic kidneys, more so on the left than the right. No hydroureteronephrosis. No stone or mass. Bladder is not well seen because of artifact from a left hip replacement. No bladder distension. Stomach/Bowel: Stomach and small intestine are normal. Normal appendix. No abnormal colon finding. Diverticulosis without evidence of diverticulitis. Vascular/Lymphatic: Aorta and IVC are normal.  No adenopathy. Reproductive: Normal Other: No free fluid or air. Musculoskeletal: Ordinary mild degenerative change affects the spine. IMPRESSION: 1. No acute finding by CT. 2. Atrophic kidneys, more so on the left than the right. 3. Diverticulosis without evidence of diverticulitis. 4. Cardiomegaly and coronary artery calcification. 5. Mild dependent atelectasis at the lung bases. Tiny amount of pleural fluid. Electronically Signed   By: Paulina Fusi M.D.   On: 04/02/2023  13:01   DG Abd Portable 1V  Result Date: 04/02/2023 CLINICAL DATA:  Nausea and vomiting EXAM: PORTABLE ABDOMEN - 1 VIEW COMPARISON:  Abdominal CT 04/05/2009 FINDINGS: Normal bowel gas pattern. Stool at the rectum without generalized abnormal stool retention. No concerning mass effect or calcification. Cardiomegaly with clear lung bases. Generalized spondylosis. Unremarkable left hip replacement. IMPRESSION: Normal bowel gas pattern. Electronically Signed   By: Tiburcio Pea M.D.   On: 04/02/2023 06:59   DG Chest Port 1 View  Result Date: 04/02/2023 CLINICAL DATA:  Shortness of breath EXAM: PORTABLE CHEST 1 VIEW COMPARISON:  04/01/2023 FINDINGS: Chronic cardiomegaly. Stable mediastinal contours. Low volume but clear lungs with no effusion or pneumothorax. Artifact from EKG leads. IMPRESSION: Stable low volume chest with cardiomegaly. Electronically Signed   By: Tiburcio Pea M.D.   On: 04/02/2023 06:58      Assessment/Plan:  INTERVAL HISTORY:   MRI of left shoulder with large joint effusion  Right knee aspirate with staphylococcus aureus growing   Principal Problem:   MSSA bacteremia Active Problems:   Obstructive sleep apnea   Essential hypertension, benign   MVP (mitral valve prolapse)   ESRD on dialysis (HCC)   Anemia in chronic kidney disease   PAF (paroxysmal atrial fibrillation) (HCC)   Aspiration pneumonia (HCC)    Peter [name] is a 72 y.o. male with end-stage renal disease on hemodialysis with atrial fibrillation on anticoagulation who has been admitted with MSSA bacteremia.  He has multiple joints that are exquisitely tender to palpation with reduced range of motion and in the case of his knee and shoulder effusions.  Knee aspirate did only show 3 700 white blood cells with 90% neutrophils but cultures are growing Staphylococcus aureus--which in this setting would be highly unsusual to be a contaminant.  ? A sampling error that only led to 3700 wbc. 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  I will want to get an MRI of the brain as well but I am not going to rush to do  that at this point in time as I think already should be given to the potential surgical sites.  Currently will need a transesophageal echocardiogram as well during this admission.  Fortunately his AV graft does not appear overtly infected.   I have personally spent 60 minutes involved in face-to-face and non-face-to-face activities for this patient on the day of the visit. Professional time spent includes the following activities: Preparing to see the patient (review of tests), Obtaining and/or reviewing separately obtained history (admission/discharge record), Performing a medically appropriate examination and/or evaluation , Ordering medications/tests/procedures, referring and communicating with other health care professionals, Documenting clinical information in the EMR, Independently interpreting results (not separately reported), Communicating results to the patient/family/caregiver, Counseling and educating the patient/family/caregiver and Care coordination (not separately reported).       LOS: 2 days   Acey Lav 04/03/2023, 2:16 PM

## 2023-04-03 NOTE — Progress Notes (Signed)
PROGRESS NOTE                                                                                                                                                                                                             Patient Demographics:    Peter [name], is a 72 y.o. male, DOB - 02-05-1951, BJY:[id number]  Outpatient Primary MD for the patient is [name], MD    LOS - 2  Admit date - 04/01/2023    Chief Complaint  Patient presents with   Fever   Emesis       Brief Narrative (HPI from H&P)   72 y.o. male with medical history significant of ESRD on HD MWF, Type 2 DM  diet controlled,  anemia of CKD, hypertension, paroxysmal atrial fibrillation on Eliquis, hyperlipidemia, was brought in for generalized weakness, apparently patient was doing fine up till 2 days ago, sometime on Sunday he started developing nausea vomiting generalized weakness, had a fall, low-grade fever, subsequently brought to the ER where he was found to be septic and admitted to the hospital.   Subjective:    [place] today has, No headache, No chest pain, No abdominal pain - No Nausea, No new weakness tingling or numbness, mild nausea, no cough or shortness of breath.   Assessment  & Plan :    Sepsis present on admission with Staph aureus bacteremia 1 out of 2, nausea vomiting and syncope at home.  No clear source of infection.  ID to see the patient shortly, source of infection not clear, for now cover with Ancef, stable CT scan abdomen pelvis nausea vomiting per [facility] likely due to his infection and sepsis, ID following, follow-up on the results of MRI C-spine, MRI left shoulder, Echocardiogram ? Subtle Endocarditis, cultures from his right knee arthrocentesis, continue to monitor closely, still overall tenuous, PCCM also involved.  Aspiration pneumonia.  Cover with Unasyn and follow cultures.  ESRD.  On MWF schedule.  Nephrology on board.   Right arm AV graft.  Fall without syncope.  Has soft tissue injurie, right knee pain, left shoulder and C-spine pain.  MRI of left shoulder and C-spine pending.  Question disseminated infection.  GERD.  IV PPI.  Paroxysmal atrial fibrillation [place] vas 2 score of 3.  Switch Eliquis to heparin till clinical situation stabilizes, placed on low-dose  Cardizem p.o. and as needed IV.  Right knee pain, post fall at home.  XRay nonacute, arthrocentesis consistent with inflammatory versus infectious fluid collection, follow cultures, Ortho on board, may require washout.  Toxic encephalopathy due to sepsis.  Improving.  Mild asymptomatic transaminitis.  [place] of acute hepatitis panel, likely shock liver, trend stable, GI on board,.  CT abdomen pelvis nonacute.  Bilirubin stable.       Condition - Extremely Guarded  Family Communication  : Family friend, POA at bedside on 04/02/2023  Code Status : Full code  Consults  : Nephrology, GI, ID, Ortho, PCCM, palliative care  PUD Prophylaxis :  PPI   Procedures  :      MRI left shoulder and C-spine.    Echocardiogram.     CT scan head and C-spine.  1.  No evidence of an acute intracranial abnormality. 2. Mild generalized parenchymal atrophy. 3. Paranasal sinus disease as described. CT cervical spine: 1. No evidence of an acute cervical spine fracture. 2. Cervical spondylosis as outlined. Notably at C2-C3 and C3-C4, central disc protrusions contribute to at least moderate spinal canal stenosis. Multilevel bony neural from narrowing.   Lower extremity bilateral venous duplex.  No DVT.    Right knee arthrocentesis.  5000 WBCs predominantly neutrophils, cultures pending.    CT abdomen pelvis - 1. No acute finding by CT. 2. Atrophic kidneys, more so on the left than the right. 3. Diverticulosis without evidence of diverticulitis. 4. Cardiomegaly and coronary artery calcification. 5. Mild dependent atelectasis at the lung bases.      Disposition  Plan  :    Status is: Inpatient   DVT Prophylaxis  :  Hep gtt >> Eliquis  Place and maintain sequential compression device Start: 04/02/23 1116    Lab Results  Component Value Date   PLT 87 (L) 04/03/2023    Diet :  Diet Order             Diet NPO time specified  Diet effective midnight                    Inpatient Medications  Scheduled Meds:  (feeding supplement) PROSource Plus  30 mL Oral BID BM   atorvastatin  10 mg Oral QHS   bupivacaine(PF)  10 mL Infiltration Once   Chlorhexidine Gluconate Cloth  6 each Topical Daily   Chlorhexidine Gluconate Cloth  6 each Topical Q0600   cinacalcet  180 mg Oral Q breakfast   diltiazem  60 mg Oral Q12H   ferric citrate  630 mg Oral TID WC   multivitamin  1 tablet Oral QHS   pantoprazole (PROTONIX) IV  40 mg Intravenous Q12H   Continuous Infusions:  ampicillin-sulbactam (UNASYN) IV 3 g (04/03/23 0828)   heparin 1,200 Units/hr (04/02/23 2143)   PRN Meds:.acetaminophen, diltiazem, lidocaine (PF), mouth rinse  Antibiotics  :    Anti-infectives (From admission, onward)    Start     Dose/Rate Route Frequency Ordered Stop   04/02/23 1245  Ampicillin-Sulbactam (UNASYN) 3 g in sodium chloride 0.9 % 100 mL IVPB        3 g 200 mL/hr over 30 Minutes Intravenous Daily 04/02/23 1152     04/01/23 2200  ceFAZolin (ANCEF) IVPB 1 g/50 mL premix  Status:  Discontinued        1 g 100 mL/hr over 30 Minutes Intravenous Every 24 hours 04/01/23 2039 04/02/23 1146   04/01/23 0830  cefTRIAXone (ROCEPHIN) 2 g in sodium  chloride 0.9 % 100 mL IVPB  Status:  Discontinued        2 g 200 mL/hr over 30 Minutes Intravenous Every 24 hours 04/01/23 0823 04/01/23 2039   04/01/23 0830  azithromycin (ZITHROMAX) 500 mg in sodium chloride 0.9 % 250 mL IVPB  Status:  Discontinued        500 mg 250 mL/hr over 60 Minutes Intravenous Every 24 hours 04/01/23 0823 04/01/23 2039         Objective:   Vitals:   04/02/23 2330 04/03/23 0400 04/03/23 0459  04/03/23 0800  BP: (!) 104/58 (!) 96/53 (!) 116/55   Pulse: 85 90 (!) 55   Resp: 20 20 (!) 21   Temp: 99.8 F (37.7 C) 99.5 F (37.5 C)  98.2 F (36.8 C)  TempSrc: Oral Oral  Oral  SpO2: 90% 91% 90% 93%  Weight:      Height:        Wt Readings from Last 3 Encounters:  04/02/23 98 kg  03/20/23 99.4 kg  02/26/23 96.8 kg     Intake/Output Summary (Last 24 hours) at 04/03/2023 0852 Last data filed at 04/03/2023 0500 Gross per 24 hour  Intake 187.07 ml  Output 1400 ml  Net -1212.93 ml     Physical Exam  Awake Alert but appears lethargic, No new F.N deficits, Normal affect [place].AT,PERRAL Supple Neck, No JVD,   Symmetrical Chest wall movement, Good air movement bilaterally, CTAB RRR,No Gallops,Rubs or new Murmurs,  +ve B.Sounds, Abd Soft, No tenderness,   Diffuse tenderness in right knee, left shoulder, some C-spine tenderness as well     Data Review:    Recent Labs  Lab 04/01/23 0851 04/02/23 0340 04/03/23 0140  WBC 13.8* 9.0 8.8  HGB 13.8 12.9* 12.1*  HCT 40.6 37.0* 35.2*  PLT 85* 77* 87*  MCV 98.8 97.4 97.2  MCH 33.6 33.9 33.4  MCHC 34.0 34.9 34.4  RDW 14.2 14.4 14.3  LYMPHSABS 0.6* 0.5* 0.7  MONOABS 0.9 0.8 0.8  EOSABS 0.0 0.1 0.0  BASOSABS 0.0 0.1 0.0    Recent Labs  Lab 04/01/23 0851 04/01/23 1447 04/01/23 1448 04/01/23 2315 04/02/23 0340 04/02/23 0555 04/02/23 0556 04/03/23 0140  NA 129* 127*  --   --   --  131*  --   --   K 5.3* 5.6*  --   --   --  5.2*  --   --   CL 91* 93*  --   --   --  90*  --   --   CO2 18* 14*  --   --   --  21*  --   --   ANIONGAP 20* 20*  --   --   --  20*  --   --   GLUCOSE 136* 125*  --   --   --  117*  --   --   BUN 102* 104*  --   --   --  58*  --   --   CREATININE 13.88* 13.58*  --   --   --  7.96*  --   --   AST 147* 172*  --   --   --   --   --   --   ALT 56* 65*  --   --   --   --   --   --   ALKPHOS 47 39  --   --   --   --   --   --  BILITOT 1.1 1.0  --   --   --   --   --   --   ALBUMIN 2.8* 2.5*  --    --   --  2.3*  --   --   CRP  --   --   --   --   --   --  43.0* 43.8*  PROCALCITON  --   --   --   --   --   --  37.09 38.83  LATICACIDVEN 4.6*  --  4.0* 3.9*  --   --   --   --   INR 1.6*  --   --   --   --   --   --   --   AMMONIA  --   --   --   --  28  --   --   --   BNP  --   --   --   --   --   --   --  136.6*  CALCIUM 9.8 9.1  --   --   --  9.1  --   --       Recent Labs  Lab 04/01/23 0851 04/01/23 1447 04/01/23 1448 04/01/23 2315 04/02/23 0340 04/02/23 0555 04/02/23 0556 04/03/23 0140  CRP  --   --   --   --   --   --  43.0* 43.8*  PROCALCITON  --   --   --   --   --   --  37.09 38.83  LATICACIDVEN 4.6*  --  4.0* 3.9*  --   --   --   --   INR 1.6*  --   --   --   --   --   --   --   AMMONIA  --   --   --   --  28  --   --   --   BNP  --   --   --   --   --   --   --  136.6*  CALCIUM 9.8 9.1  --   --   --  9.1  --   --      Micro Results Recent Results (from the past 240 hour(s))  Resp panel by RT-PCR (RSV, Flu A&B, Covid) Anterior Nasal Swab     Status: None   Collection Time: 04/01/23  8:23 AM   Specimen: Anterior Nasal Swab  Result Value Ref Range Status   SARS Coronavirus 2 by RT PCR NEGATIVE NEGATIVE Final   Influenza A by PCR NEGATIVE NEGATIVE Final   Influenza B by PCR NEGATIVE NEGATIVE Final    Comment: (NOTE) The Xpert Xpress SARS-CoV-2/FLU/RSV plus assay is intended as an aid in the diagnosis of influenza from Nasopharyngeal swab specimens and should not be used as a sole basis for treatment. Nasal washings and aspirates are unacceptable for Xpert Xpress SARS-CoV-2/FLU/RSV testing.  Fact Sheet for Patients: BloggerCourse.com  Fact Sheet for Healthcare Providers: SeriousBroker.it  This test is not yet approved or cleared by the [place] FDA and has been authorized for detection and/or diagnosis of SARS-CoV-2 by FDA under an Emergency Use Authorization (EUA). This EUA will remain in effect  (meaning this test can be used) for the duration of the COVID-19 declaration under Section 564(b)(1) of the Act, 21 U.S.C. section 360bbb-3(b)(1), unless the authorization is terminated or revoked.     Resp Syncytial Virus by PCR NEGATIVE NEGATIVE Final  Comment: (NOTE) Fact Sheet for Patients: BloggerCourse.com  Fact Sheet for Healthcare Providers: SeriousBroker.it  This test is not yet approved or cleared by the [place] FDA and has been authorized for detection and/or diagnosis of SARS-CoV-2 by FDA under an Emergency Use Authorization (EUA). This EUA will remain in effect (meaning this test can be used) for the duration of the COVID-19 declaration under Section 564(b)(1) of the Act, 21 U.S.C. section 360bbb-3(b)(1), unless the authorization is terminated or revoked.  Performed at [facility], [address], [place], [place] [address]   Blood Culture (routine x 2)     Status: Abnormal (Preliminary result)   Collection Time: 04/01/23  8:51 AM   Specimen: BLOOD LEFT FOREARM  Result Value Ref Range Status   Specimen Description BLOOD LEFT FOREARM  Final   Special Requests   Final    BOTTLES DRAWN AEROBIC AND ANAEROBIC Blood Culture results may not be optimal due to an excessive volume of blood received in culture bottles   Culture  Setup Time   Final    GRAM POSITIVE COCCI IN CLUSTERS IN BOTH AEROBIC AND ANAEROBIC BOTTLES CRITICAL RESULT CALLED TO, READ BACK BY AND VERIFIED WITH: PHARMD HAILEY VONDOLEN ON 04/01/23 @ 2017 BY DRT    Culture (A)  Final    STAPHYLOCOCCUS AUREUS SUSCEPTIBILITIES TO FOLLOW Performed at [facility], [address], [place], [place] [address]    Report Status PENDING  Incomplete  Blood Culture ID Panel (Reflexed)     Status: Abnormal   Collection Time: 04/01/23  8:51 AM  Result Value Ref Range Status   Enterococcus faecalis NOT DETECTED NOT DETECTED Final   Enterococcus Faecium  NOT DETECTED NOT DETECTED Final   Listeria monocytogenes NOT DETECTED NOT DETECTED Final   Staphylococcus species DETECTED (A) NOT DETECTED Final    Comment: CRITICAL RESULT CALLED TO, READ BACK BY AND VERIFIED WITH: PHARMD HAILEY VONDOLEN ON 04/01/23 @ 2017 BY DRT    Staphylococcus aureus (BCID) DETECTED (A) NOT DETECTED Final    Comment: CRITICAL RESULT CALLED TO, READ BACK BY AND VERIFIED WITH: PHARMD HAILEY VONDOLEN ON 04/01/23 @ 2017 BY DRT    Staphylococcus epidermidis NOT DETECTED NOT DETECTED Final   Staphylococcus lugdunensis NOT DETECTED NOT DETECTED Final   Streptococcus species NOT DETECTED NOT DETECTED Final   Streptococcus agalactiae NOT DETECTED NOT DETECTED Final   Streptococcus pneumoniae NOT DETECTED NOT DETECTED Final   Streptococcus pyogenes NOT DETECTED NOT DETECTED Final   A.calcoaceticus-baumannii NOT DETECTED NOT DETECTED Final   Bacteroides fragilis NOT DETECTED NOT DETECTED Final   Enterobacterales NOT DETECTED NOT DETECTED Final   Enterobacter cloacae complex NOT DETECTED NOT DETECTED Final   Escherichia coli NOT DETECTED NOT DETECTED Final   Klebsiella aerogenes NOT DETECTED NOT DETECTED Final   Klebsiella oxytoca NOT DETECTED NOT DETECTED Final   Klebsiella pneumoniae NOT DETECTED NOT DETECTED Final   Proteus species NOT DETECTED NOT DETECTED Final   Salmonella species NOT DETECTED NOT DETECTED Final   Serratia marcescens NOT DETECTED NOT DETECTED Final   Haemophilus influenzae NOT DETECTED NOT DETECTED Final   Neisseria meningitidis NOT DETECTED NOT DETECTED Final   Pseudomonas aeruginosa NOT DETECTED NOT DETECTED Final   Stenotrophomonas maltophilia NOT DETECTED NOT DETECTED Final   Candida albicans NOT DETECTED NOT DETECTED Final   Candida auris NOT DETECTED NOT DETECTED Final   Candida glabrata NOT DETECTED NOT DETECTED Final   Candida krusei NOT DETECTED NOT DETECTED Final   Candida parapsilosis  NOT DETECTED NOT DETECTED Final   Candida tropicalis NOT  DETECTED NOT DETECTED Final   Cryptococcus neoformans/gattii NOT DETECTED NOT DETECTED Final   Meth resistant mecA/C and MREJ NOT DETECTED NOT DETECTED Final    Comment: Performed at [facility], [address], [place], [place] [address]  Blood Culture (routine x 2)     Status: None (Preliminary result)   Collection Time: 04/01/23  1:09 PM   Specimen: BLOOD LEFT HAND  Result Value Ref Range Status   Specimen Description BLOOD LEFT HAND  Final   Special Requests   Final    BOTTLES DRAWN AEROBIC AND ANAEROBIC Blood Culture adequate volume   Culture   Final    NO GROWTH < 24 HOURS Performed at [facility], [address], [place], [place] [address]    Report Status PENDING  Incomplete  Culture, blood (Routine X 2) w Reflex to ID Panel     Status: None (Preliminary result)   Collection Time: 04/01/23 11:15 PM   Specimen: BLOOD  Result Value Ref Range Status   Specimen Description BLOOD BLOOD LEFT ARM  Final   Special Requests   Final    BOTTLES DRAWN AEROBIC AND ANAEROBIC Blood Culture results may not be optimal due to an inadequate volume of blood received in culture bottles   Culture   Final    NO GROWTH < 12 HOURS Performed at [facility], [address], [place], [place] [address]    Report Status PENDING  Incomplete  Culture, blood (Routine X 2) w Reflex to ID Panel     Status: None (Preliminary result)   Collection Time: 04/01/23 11:21 PM   Specimen: BLOOD  Result Value Ref Range Status   Specimen Description BLOOD BLOOD LEFT HAND  Final   Special Requests   Final    BOTTLES DRAWN AEROBIC AND ANAEROBIC Blood Culture results may not be optimal due to an inadequate volume of blood received in culture bottles   Culture   Final    NO GROWTH < 12 HOURS Performed at [facility], [address] [place], [place] [address]    Report Status PENDING  Incomplete  MRSA Next Gen by PCR, Nasal     Status: None   Collection Time: 04/02/23  5:42 AM   Specimen:  Nasal Mucosa; Nasal Swab  Result Value Ref Range Status   MRSA by PCR Next Gen NOT DETECTED NOT DETECTED Final    Comment: (NOTE) The GeneXpert MRSA Assay (FDA approved for NASAL specimens only), is one component of a comprehensive MRSA colonization surveillance program. It is not intended to diagnose MRSA infection nor to guide or monitor treatment for MRSA infections. Test performance is not FDA approved in patients less than 1 years old. Performed at [facility], [address], [place], [place] [address]   Body fluid culture w Gram Stain     Status: None (Preliminary result)   Collection Time: 04/02/23  9:13 PM   Specimen: Body Fluid  Result Value Ref Range Status   Specimen Description FLUID SYNOVIAL RIGHT KNEE  Final   Special Requests NONE  Final   Gram Stain   Final    ABUNDANT WBC PRESENT, PREDOMINANTLY PMN RARE GRAM POSITIVE COCCI INTRACELLULAR Gram Stain Report Called to,Read Back By and Verified With: RN Loney Loh [phone] @ 2258 FH Performed at [facility], [address], [place], [place] [address]    Culture PENDING  Incomplete   Report Status PENDING  Incomplete    Radiology Reports VAS [place] LOWER EXTREMITY VENOUS (DVT)  Result Date: 04/02/2023  Lower Venous DVT Study Patient Name:  TYJOHN MASAKI  Date of Exam:   04/02/2023 Medical Rec #: [id number]          Accession #:    [id number] Date of Birth: June 05, 1951          Patient Gender: M Patient Age:   33 years Exam Location:  [facility] Procedure:      VAS [place] LOWER EXTREMITY VENOUS (DVT) Referring Phys: Kathlen Mody --------------------------------------------------------------------------------  Indications: Swelling.  Limitations: Body habitus and poor ultrasound/tissue interface. Comparison Study: Previous 01/09/18 negative. Performing Technologist: McKayla Maag RVT, [place]  Examination Guidelines: A complete evaluation includes B-mode imaging, spectral Doppler, color Doppler, and power Doppler as  needed of all accessible portions of each vessel. Bilateral testing is considered an integral part of a complete examination. Limited examinations for reoccurring indications may be performed as noted. The reflux portion of the exam is performed with the patient in reverse Trendelenburg.  +-----+---------------+---------+-----------+----------+--------------+ RIGHTCompressibilityPhasicitySpontaneityPropertiesThrombus Aging +-----+---------------+---------+-----------+----------+--------------+ CFV  Full           Yes      Yes                                 +-----+---------------+---------+-----------+----------+--------------+ SFJ  Full                                                        +-----+---------------+---------+-----------+----------+--------------+   +---------+---------------+---------+-----------+----------+-------------------+ LEFT     CompressibilityPhasicitySpontaneityPropertiesThrombus Aging      +---------+---------------+---------+-----------+----------+-------------------+ CFV      Full           Yes      Yes                                      +---------+---------------+---------+-----------+----------+-------------------+ SFJ      Full                                                             +---------+---------------+---------+-----------+----------+-------------------+ FV Prox  Full                                                             +---------+---------------+---------+-----------+----------+-------------------+ FV Mid   Full                                                             +---------+---------------+---------+-----------+----------+-------------------+ FV DistalFull                                                             +---------+---------------+---------+-----------+----------+-------------------+  PFV      Full                                                              +---------+---------------+---------+-----------+----------+-------------------+ POP      Full           Yes      Yes                                      +---------+---------------+---------+-----------+----------+-------------------+ PTV      Full                                                             +---------+---------------+---------+-----------+----------+-------------------+ PERO                                                  Not well visualized +---------+---------------+---------+-----------+----------+-------------------+    Summary: RIGHT: - No evidence of common femoral vein obstruction.  LEFT: - There is no evidence of deep vein thrombosis in the lower extremity. However, portions of this examination were limited- see technologist comments above.  - No cystic structure found in the popliteal fossa.  *See table(s) above for measurements and observations. Electronically signed by Coral Else MD on 04/02/2023 at 9:15:20 PM.    Final    CT ABDOMEN PELVIS W CONTRAST  Result Date: 04/02/2023 CLINICAL DATA:  Abdominal pain, acute, nonlocalized. End stage renal disease. Nausea and vomiting. Sepsis. EXAM: CT ABDOMEN AND PELVIS WITH CONTRAST TECHNIQUE: Multidetector CT imaging of the abdomen and pelvis was performed using the standard protocol following bolus administration of intravenous contrast. RADIATION DOSE REDUCTION: This exam was performed according to the departmental dose-optimization program which includes automated exposure control, adjustment of the mA and/or kV according to patient size and/or use of iterative reconstruction technique. CONTRAST:  75mL OMNIPAQUE IOHEXOL 350 MG/ML SOLN COMPARISON:  04/05/2009 FINDINGS: Lower chest: Mild dependent atelectasis at the lung bases. Tiny amount of pleural fluid. Non dependent lung as well aerated. There is cardiomegaly and coronary artery calcification. Hepatobiliary: No liver parenchymal abnormality. No calcified gallstones.  No ductal dilatation. Pancreas: Normal Spleen: Normal Adrenals/Urinary Tract: Adrenal glands are normal. Atrophic kidneys, more so on the left than the right. No hydroureteronephrosis. No stone or mass. Bladder is not well seen because of artifact from a left hip replacement. No bladder distension. Stomach/Bowel: Stomach and small intestine are normal. Normal appendix. No abnormal colon finding. Diverticulosis without evidence of diverticulitis. Vascular/Lymphatic: Aorta and IVC are normal.  No adenopathy. Reproductive: Normal Other: No free fluid or air. Musculoskeletal: Ordinary mild degenerative change affects the spine. IMPRESSION: 1. No acute finding by CT. 2. Atrophic kidneys, more so on the left than the right. 3. Diverticulosis without evidence of diverticulitis. 4. Cardiomegaly and coronary artery calcification. 5. Mild dependent atelectasis at the lung bases. Tiny amount of pleural fluid. Electronically Signed   By: Loraine Leriche  Shogry M.D.   On: 04/02/2023 13:01   DG Abd Portable 1V  Result Date: 04/02/2023 CLINICAL DATA:  Nausea and vomiting EXAM: PORTABLE ABDOMEN - 1 VIEW COMPARISON:  Abdominal CT 04/05/2009 FINDINGS: Normal bowel gas pattern. Stool at the rectum without generalized abnormal stool retention. No concerning mass effect or calcification. Cardiomegaly with clear lung bases. Generalized spondylosis. Unremarkable left hip replacement. IMPRESSION: Normal bowel gas pattern. Electronically Signed   By: Tiburcio Pea M.D.   On: 04/02/2023 06:59   DG Chest Port 1 View  Result Date: 04/02/2023 CLINICAL DATA:  Shortness of breath EXAM: PORTABLE CHEST 1 VIEW COMPARISON:  04/01/2023 FINDINGS: Chronic cardiomegaly. Stable mediastinal contours. Low volume but clear lungs with no effusion or pneumothorax. Artifact from EKG leads. IMPRESSION: Stable low volume chest with cardiomegaly. Electronically Signed   By: Tiburcio Pea M.D.   On: 04/02/2023 06:58   DG Shoulder Left  Result Date:  04/01/2023 CLINICAL DATA:  Pain after fall EXAM: LEFT SHOULDER - 2 VIEW COMPARISON:  None Available. FINDINGS: Osteopenia. Osteophyte formation of the glenohumeral joint and AC joint with some joint space loss. There is also elevation of the humeral head relative to the acromion. Please correlate for a full-thickness rotator cuff tear. No acute fracture or dislocation. Surgical clips in the left axillary region. IMPRESSION: Moderate degenerative changes. High-riding humeral head. Please correlate for a full-thickness rotator cuff tear Electronically Signed   By: Karen Kays M.D.   On: 04/01/2023 11:04   DG Knee Complete 4 Views Right  Result Date: 04/01/2023 CLINICAL DATA:  Pain after fall EXAM: RIGHT KNEE - COMPLETE 4 VIEW COMPARISON:  None Available. FINDINGS: Moderate joint space loss of the medial compartment. Small osteophytes seen of all 3 compartments greatest of the patellofemoral joint. There is a moderate joint effusion. Hyperostosis. Osteopenia. No fracture or dislocation. IMPRESSION: Tricompartment degenerative changes identified, overall moderate greatest involving the medial compartment and patellofemoral joint. Moderate joint effusion of uncertain etiology. This has a broad differential. This could be related to the degenerative changes. However please correlate for history of trauma or infection. Electronically Signed   By: Karen Kays M.D.   On: 04/01/2023 11:03   DG Knee Complete 4 Views Left  Result Date: 04/01/2023 CLINICAL DATA:  Pain after fall.  Weakness for 2 days EXAM: LEFT KNEE - COMPLETE 4 VIEW COMPARISON:  None Available. FINDINGS: Osteopenia. Slight joint space loss of the medial compartment. Small osteophytes of all 3 compartments. No joint effusion on lateral view. No fracture or dislocation. Hyperostosis of the patella. Vascular calcifications. IMPRESSION: Mild degenerative changes.  No joint effusion. Electronically Signed   By: Karen Kays M.D.   On: 04/01/2023 11:00   CT  Head Wo Contrast  Result Date: 04/01/2023 CLINICAL DATA:  Provided history: Polytrauma, blunt. EXAM: CT HEAD WITHOUT CONTRAST CT CERVICAL SPINE WITHOUT CONTRAST TECHNIQUE: Multidetector CT imaging of the head and cervical spine was performed following the standard protocol without intravenous contrast. Multiplanar CT image reconstructions of the cervical spine were also generated. RADIATION DOSE REDUCTION: This exam was performed according to the departmental dose-optimization program which includes automated exposure control, adjustment of the mA and/or kV according to patient size and/or use of iterative reconstruction technique. COMPARISON:  None. FINDINGS: CT HEAD FINDINGS Brain: Mild generalized parenchymal atrophy. There is no acute intracranial hemorrhage. No demarcated cortical infarct. No extra-axial fluid collection. No evidence of an intracranial mass. No midline shift. Vascular: No hyperdense vessel.  Atherosclerotic calcifications. Skull: No calvarial fracture or aggressive  osseous lesion. Sinuses/Orbits: No mass or acute finding within the imaged orbits. Mild-to-moderate mucosal thickening, and small volume frothy secretions, within the right maxillary sinus. Small mucous retention cyst within the left maxillary sinus. CT CERVICAL SPINE FINDINGS Alignment: No significant spondylolisthesis. Skull base and vertebrae: The basion-dental and atlanto-dental intervals are maintained.No evidence of acute fracture to the cervical spine. Soft tissues and spinal canal: No prevertebral fluid or swelling. No visible canal hematoma. Disc levels: Cervical spondylosis with multilevel disc space narrowing, disc bulges/central disc protrusions, endplate spurring, uncovertebral hypertrophy and facet arthrosis. Disc space narrowing is greatest at C5-C6 (moderate/advanced), C7-T1 (moderate/advanced), T1-T2 (advanced) and T2-T3 (advanced). 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 Multilevel spinal  canal stenosis. Most notably at C2-C3 and C3-C4, central disc protrusions contribute to at least moderate spinal canal stenosis. Multilevel bony neural foraminal narrowing. Upper chest: No consolidation within the imaged lung apices. No visible pneumothorax. IMPRESSION: CT head: 1.  No evidence of an acute intracranial abnormality. 2. Mild generalized parenchymal atrophy. 3. Paranasal sinus disease as described. CT cervical spine: 1. No evidence of an acute cervical spine fracture. 2. Cervical spondylosis as outlined. Notably at C2-C3 and C3-C4, central disc protrusions contribute to at least moderate spinal canal stenosis. Multilevel bony neural from narrowing. Electronically Signed   By: Jackey Loge D.O.   On: 04/01/2023 10:32   CT Cervical Spine Wo Contrast  Result Date: 04/01/2023 CLINICAL DATA:  Provided history: Polytrauma, blunt. EXAM: CT HEAD WITHOUT CONTRAST CT CERVICAL SPINE WITHOUT CONTRAST TECHNIQUE: Multidetector CT imaging of the head and cervical spine was performed following the standard protocol without intravenous contrast. Multiplanar CT image reconstructions of the cervical spine were also generated. RADIATION DOSE REDUCTION: This exam was performed according to the departmental dose-optimization program which includes automated exposure control, adjustment of the mA and/or kV according to patient size and/or use of iterative reconstruction technique. COMPARISON:  None. FINDINGS: CT HEAD FINDINGS Brain: Mild generalized parenchymal atrophy. There is no acute intracranial hemorrhage. No demarcated cortical infarct. No extra-axial fluid collection. No evidence of an intracranial mass. No midline shift. Vascular: No hyperdense vessel.  Atherosclerotic calcifications. Skull: No calvarial fracture or aggressive osseous lesion. Sinuses/Orbits: No mass or acute finding within the imaged orbits. Mild-to-moderate mucosal thickening, and small volume frothy secretions, within the right maxillary sinus.  Small mucous retention cyst within the left maxillary sinus. CT CERVICAL SPINE FINDINGS Alignment: No significant spondylolisthesis. Skull base and vertebrae: The basion-dental and atlanto-dental intervals are maintained.No evidence of acute fracture to the cervical spine. Soft tissues and spinal canal: No prevertebral fluid or swelling. No visible canal hematoma. Disc levels: Cervical spondylosis with multilevel disc space narrowing, disc bulges/central disc protrusions, endplate spurring, uncovertebral hypertrophy and facet arthrosis. Disc space narrowing is greatest at C5-C6 (moderate/advanced), C7-T1 (moderate/advanced), T1-T2 (advanced) and T2-T3 (advanced). 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 Most notably at C2-C3 and C3-C4, central disc protrusions contribute to at least moderate spinal canal stenosis. Multilevel bony neural foraminal narrowing. Upper chest: No consolidation within the imaged lung apices. No visible pneumothorax. IMPRESSION: CT head: 1.  No evidence of an acute intracranial abnormality. 2. Mild generalized parenchymal atrophy. 3. Paranasal sinus disease as described. CT cervical spine: 1. No evidence of an acute cervical spine fracture. 2. Cervical spondylosis as outlined. Notably at C2-C3 and C3-C4, central disc protrusions contribute to at least moderate spinal canal stenosis. Multilevel bony neural from narrowing. Electronically Signed   By: Jackey Loge  D.O.   On: 04/01/2023 10:32   DG Chest Portable 1 View  Result Date: 04/01/2023 CLINICAL DATA:  Shortness of breath EXAM: PORTABLE CHEST 1 VIEW COMPARISON:  Chest radiograph dated 06/09/2018 FINDINGS: The medial apices are obscured due to overlying mandible. Low lung volumes with bronchovascular crowding. Medial right lower lung patchy opacities. No pleural effusion or pneumothorax. Similar enlarged cardiomediastinal silhouette. No acute osseous abnormality. IMPRESSION: 1.  Medial right lower lung patchy opacities, which may represent atelectasis, aspiration, or pneumonia. 2. Similar cardiomegaly. Electronically Signed   By: Agustin Cree M.D.   On: 04/01/2023 08:50      Signature  -   Susa Raring M.D on 04/03/2023 at 8:52 AM   -  To page go to [email or web address]

## 2023-04-03 NOTE — Progress Notes (Signed)
Patient ID: Peter [name], male   DOB: 04-06-1951, 72 y.o.   MRN: [id number]    Progress Note from the Palliative Medicine Team at [facility]   Patient Name: Peter [name]        Date: 04/03/2023 DOB: 1951/08/29  Age: 72 y.o. MRN#: [id number] Attending Physician: Leroy Sea, MD Primary Care Physician: [name], MD Admit Date: 04/01/2023    Extensive chart review has been completed prior to meeting with patient/family  including labs, vital signs, imaging, progress/consult notes, orders, medications and available advance directive documents.   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 Started not to feel well on Sunday, 6/30 with nausea and vomiting. Sustained a fall, onto right knee and left shoulder pain. Admitted to [facility] on 7/2 for further workup with suspected sepsis, febrile 102.3 on admit. Blood cultures positive for MSSA bacteremia, source unclear at this time, being treated additionally for aspiration pneumonia, and mild transaminitis. Nephrology, ID, GI, and palliative care following. Ortho consulted and underwent aspiration of right knee showing many WBC, primarily PMNs. He underwent iHD 7/3 with 1.4L removed 7/3. TTE pending. Overnight, had intermittent low blood pressure episodes and patient is more sonolucent. Given concern for worsening sepsis, PCCM consulted.    This NP assessed patient at the bedside as a follow up for palliative medicine needs and emotional support.  Significant Other/ Peter [name] at bedside  Patient remains lethargic, he is alert and oriented x 3.   no complaints voiced at this time.    Continue to treat the treatable, all are hopeful for improvement.     Education offered today regarding  the importance of continued conversation with family and their  medical providers regarding overall plan of care and treatment  options,  ensuring decisions are within the context of the patients values and GOCs.  Peter [name] is looking at home  for patient's advance care planning documents.   Education offered that [employer] and living will can be secured while here in the hospital with the assistance of spiritual care.  Questions and concerns addressed     Time: 25 minutes  Detailed review of medical records ( labs, imaging, vital signs), medically appropriate exam ( MS, skin, resp)   discussed with treatment team, counseling and education to patient, family, staff, documenting clinical information, medication management, coordination of care    Lorinda Creed NP  Palliative Medicine Team Team Phone # [phone] Pager [phone]

## 2023-04-03 NOTE — Progress Notes (Signed)
Exam was attempted but patients size and inability to lay other than on his back or raise his arm prevented [place] from being able to acquire any images. Patients wrist remained too close to bore wall no matter how we tried to position him.

## 2023-04-03 NOTE — Progress Notes (Signed)
Echocardiogram 2D Echocardiogram has been performed.  Peter [name] 04/03/2023, 4:53 PM

## 2023-04-03 NOTE — Plan of Care (Signed)
  Problem: Clinical Measurements: Goal: Ability to maintain clinical measurements within normal limits will improve Outcome: Progressing   Problem: Fluid Volume: Goal: Hemodynamic stability will improve Outcome: Progressing   Problem: Clinical Measurements: Goal: Signs and symptoms of infection will decrease Outcome: Progressing

## 2023-04-03 NOTE — Consult Note (Signed)
NAME:  Peter [name], MRN:  [id number], DOB:  December 11, 1950, LOS: 2 ADMISSION DATE:  04/01/2023, CONSULTATION DATE:  04/03/23 REFERRING MD:  Dr. 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  Started not to feel well on Sunday, 6/30 with nausea and vomiting.  Sustained a fall, onto right knee and left shoulder pain.  Admitted to [facility] on 7/2 for further workup with suspected sepsis, febrile 102.3 on admit.  Blood cultures positive for MSSA bacteremia, source unclear at this time, being treated additionally for aspiration pneumonia, and mild transaminitis.  Nephrology, ID, GI, and palliative care following.  Ortho consulted and underwent aspiration of right knee showing many WBC, primarily PMNs.  He underwent iHD 7/3 with 1.4L removed 7/3.  TTE pending.  Overnight, had intermittent low blood pressure episodes and patient is more sonolucent.  Given concern for worsening sepsis, PCCM consulted.    Pertinent  Medical History   Past Medical History:  Diagnosis Date   Anemia in chronic kidney disease (CKD)    CKD (chronic kidney disease) stage 5, GFR less than 15 ml/min (HCC)    M-W-F   Diabetes mellitus without complication (HCC) 03/2012   Hx type II - no meds, diet controlled, lost weight   ED (erectile dysfunction)    Heart murmur    not significant   History of kidney stones    passed 1 , 1 was removed with kidney disease in 2010   Hypercholesteremia    Hypertension    Iron deficiency    Kidney stone 2010   MVP (mitral valve prolapse)    Posterior with MR by ECHO   OSA (obstructive sleep apnea)    does not use cpap, lost weight    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   Syncope      Significant Hospital Events: Including procedures, antibiotic start and stop  dates in addition to other pertinent events   7/2 admitted  7/3 iHD, right knee joint asp  Interim History / Subjective:  No complaints at rest per patient, denies SOB, N/V. C/o of right hand pain and other joint pain with movement.   Objective   Blood pressure (!) 116/55, pulse (!) 55, temperature 98.2 F (36.8 C), temperature source Oral, resp. rate (!) 21, height 5\' 6"  (1.676 m), weight 98 kg, SpO2 93 %.        Intake/Output Summary (Last 24 hours) at 04/03/2023 0840 Last data filed at 04/03/2023 0500 Gross per 24 hour  Intake 187.07 ml  Output 1400 ml  Net -1212.93 ml   Filed Weights   04/01/23 2031 04/02/23 1601 04/02/23 1908  Weight: 97.6 kg 99 kg 98 kg   Examination: General:  AoC ill appearing elderly male lying in bed in NAD HEENT: MM pink/moist, poor dentition, pupils 3/r, soft/ hoarse phonation Neuro: awakens to voice, oriented to person, place, time but will drift off to sleep if not stimulated, f/c, MAE- limited due to weakness and some pain CV: irir, afib, rate controlled, no murmur PULM:  non labored, mostly clear, few scattered rhonchi right base GI: soft, bs+, NT/ ND Extremities: warm/dry, mild generalized edema, np, R AVF +b/t, dressing cdi, mild swelling to right knee, no obvious tenderness, no obv  deformity, erythema to right hand Skin: healing abrasions to bilateral knees  Labs reviewed> CPR 43, PCT 38, H/H stable   Resolved Hospital Problem list    Assessment & Plan:   MSSA bacteremia- source unclear, possible right knee joint vs disseminated infection, r/o IE Aspiration PNA Toxic/ metabolic encephalopathy OSA- untreated  ESRD  PAF  Transaminitis, mild with N/V- hepatitis panel neg GERD Fall P:  - at this time, BP remains stable, afib > rate controlled, and he remains easily responsive to verbal but somnolent, ok to remain in PCU and monitor closely.  - currently on room air, stable  Ongoing aggressive pulmonary hygiene - may need to hold  cardizem or lower dose if BP does not tolerate; heparin per pharmacy - will add CPAP at bedtime, ideally needs f/u outpt for this - encephalopathy likely 2/2 sepsis but will check VBG, recheck ammonia (ok yest) and LFTs for completeness.  CTH neg on admit.  - consider albumin bolus if recurrent hypotension, UF -1.4L overnight, BNP 186> 136 - echo pending  - following cultures - abx per ID, currently on unasyn - MRI of right shoulder and cspine pending - iHD per Nephrology> pending iHD 7/5 - remainder per TRH primary with ortho, ID, Nehro PMT, GI following   Best Practice (right click and "Reselect all SmartList Selections" daily)   Per primary team   Labs   CBC: Recent Labs  Lab 04/01/23 0851 04/02/23 0340 04/03/23 0140  WBC 13.8* 9.0 8.8  NEUTROABS 12.1* 7.5 7.2  HGB 13.8 12.9* 12.1*  HCT 40.6 37.0* 35.2*  MCV 98.8 97.4 97.2  PLT 85* 77* 87*    Basic Metabolic Panel: Recent Labs  Lab 04/01/23 0851 04/01/23 1447 04/02/23 0555  NA 129* 127* 131*  K 5.3* 5.6* 5.2*  CL 91* 93* 90*  CO2 18* 14* 21*  GLUCOSE 136* 125* 117*  BUN 102* 104* 58*  CREATININE 13.88* 13.58* 7.96*  CALCIUM 9.8 9.1 9.1  PHOS  --   --  4.7*   GFR: Estimated Creatinine Clearance: 8.9 mL/min (A) (by C-G formula based on SCr of 7.96 mg/dL (H)). Recent Labs  Lab 04/01/23 0851 04/01/23 1448 04/01/23 2315 04/02/23 0340 04/02/23 0556 04/03/23 0140  PROCALCITON  --   --   --   --  37.09 38.83  WBC 13.8*  --   --  9.0  --  8.8  LATICACIDVEN 4.6* 4.0* 3.9*  --   --   --     Liver Function Tests: Recent Labs  Lab 04/01/23 0851 04/01/23 1447 04/02/23 0555  AST 147* 172*  --   ALT 56* 65*  --   ALKPHOS 47 39  --   BILITOT 1.1 1.0  --   PROT 7.4 6.6  --   ALBUMIN 2.8* 2.5* 2.3*   No results for input(s): "LIPASE", "AMYLASE" in the last 168 hours. Recent Labs  Lab 04/02/23 0340  AMMONIA 28    ABG    Component Value Date/Time   TCO2 28 01/16/2023 0820   O2SAT 81.6 05/29/2018  2135     Coagulation Profile: Recent Labs  Lab 04/01/23 0851  INR 1.6*    Cardiac Enzymes: No results for input(s): "CKTOTAL", "CKMB", "CKMBINDEX", "TROPONINI" in the last 168 hours.  HbA1C: Hgb A1c MFr Bld  Date/Time Value Ref Range Status  05/30/2018 02:49 AM 5.7 (H) 4.8 - 5.6 % Final    Comment:    (NOTE) Pre diabetes:  5.7%-6.4% Diabetes:              >6.4% Glycemic control for   <7.0% adults with diabetes     CBG: No results for input(s): "GLUCAP" in the last 168 hours.  Review of Systems:   Limited> symptoms present on admit.  Currently denies all symptoms except for right hand pain with ROM Review of Systems  Constitutional:  Positive for fever and malaise/fatigue. Negative for chills.  Respiratory:  Negative for cough, sputum production, shortness of breath and wheezing.   Cardiovascular:  Negative for chest pain and leg swelling.  Gastrointestinal:  Positive for nausea and vomiting. Negative for abdominal pain and blood in stool.  Musculoskeletal:  Positive for falls and joint pain.  Neurological:  Positive for weakness. Negative for focal weakness and headaches.   Past Medical History:  He,  has a past medical history of Anemia in chronic kidney disease (CKD), CKD (chronic kidney disease) stage 5, GFR less than 15 ml/min (HCC), Diabetes mellitus without complication (HCC) (03/2012), ED (erectile dysfunction), Heart murmur, History of kidney stones, Hypercholesteremia, Hypertension, Iron deficiency, Kidney stone (2010), MVP (mitral valve prolapse), OSA (obstructive sleep apnea), Renal oncocytoma of left kidney (2010), and Syncope.   Surgical History:   Past Surgical History:  Procedure Laterality Date   A/V FISTULAGRAM Left 02/10/2018   Procedure: A/V FISTULAGRAM;  Surgeon: Nada Libman, MD;  Location: [facility];  Service: Cardiovascular;  Laterality: Left;   APPLICATION OF WOUND VAC Right 03/15/2021   Procedure: APPLICATION OF WOUND VAC;   Surgeon: Leonie Douglas, MD;  Location: [facility] OR;  Service: Vascular;  Laterality: Right;   AV FISTULA PLACEMENT Left 06/14/2016   Procedure: ARTERIOVENOUS (AV) FISTULA CREATION LEFT ARM;  Surgeon: Chuck Hint, MD;  Location: [facility] OR;  Service: Vascular;  Laterality: Left;   AV FISTULA PLACEMENT Right 06/09/2018   Procedure: CREATION Right arm Brachiocephalic;  Surgeon: Maeola Harman, MD;  Location: [facility] OR;  Service: Vascular;  Laterality: Right;   AV FISTULA PLACEMENT Right 01/16/2023   Procedure: INSERTION OF RIGHT ARM ARTERIOVENOUS (AV) GORE-TEX GRAFT;  Surgeon: Leonie Douglas, MD;  Location: [facility] OR;  Service: Vascular;  Laterality: Right;   BASCILIC VEIN TRANSPOSITION Right 09/10/2018   Procedure: CEPHALIC VEIN TRANSPOSITION;  Surgeon: Maeola Harman, MD;  Location[facility] OR;  Service: Vascular;  Laterality: Right;   BASCILIC VEIN TRANSPOSITION Left 01/25/2021   Procedure: LEFT ARMFIRST STAGE BASCILIC VEIN TRANSPOSITION;  Surgeon: Leonie Douglas, MD;  Location: [facility] OR;  Service: Vascular;  Laterality: Left;  PERIPHERAL NERVE BLOCK   BASCILIC VEIN TRANSPOSITION Left 03/15/2021   Procedure: LEFT UPPER ARM SECOND STAGE BASILIC TRANSPOSITION;  Surgeon: Leonie Douglas, MD;  Location: [facility] OR;  Service: Vascular;  Laterality: Left;   COLONOSCOPY     INSERTION OF DIALYSIS CATHETER N/A 06/09/2018   Procedure: INSERTION OF DIALYSIS CATHETER;  Surgeon: Maeola Harman, MD;  Location: [facility] OR;  Service: Vascular;  Laterality: N/A;   JOINT REPLACEMENT Left 2010   hip   PARTIAL NEPHRECTOMY Left 2010   PERIPHERAL VASCULAR INTERVENTION Left 02/10/2018   Procedure: PERIPHERAL VASCULAR INTERVENTION;  Surgeon: Nada Libman, MD;  Location: [facility];  Service: Cardiovascular;  Laterality: Left;   TONSILLECTOMY       Social History:   reports that he quit smoking about 36 years ago. His smoking use included cigarettes. His smokeless tobacco use includes chew. He  reports that he does not drink alcohol  and does not use drugs.   Family History:  His family history includes Dementia in his mother; Hypertension in his father.   Allergies Allergies  Allergen Reactions   Penicillins Other (See Comments)    UNSPECIFIED REACTION Childhood allergy    Doxazosin Mesylate     ineffective for BP (10/2014)   Triamterene-Hctz     increased creatinine (see 09/2014 office note)     Home Medications  Prior to Admission medications   Medication Sig Start Date End Date Taking? Authorizing Provider  apixaban (ELIQUIS) 5 MG TABS tablet Take 1 tablet (5 mg total) by mouth 2 (two) times daily. 02/26/23   Rollene Rotunda, MD  apixaban (ELIQUIS) 5 MG TABS tablet Take 1 tablet (5 mg total) by mouth 2 (two) times daily. 02/26/23   Rollene Rotunda, MD  atorvastatin (LIPITOR) 10 MG tablet Take 10 mg by mouth at bedtime.    [provider]  AURYXIA 1 GM 210 MG(Fe) tablet Take 420-630 mg by mouth See admin instructions. Take 630mg  by mouth three times daily with meals and take 420mg  by mouth with snacks.    [provider]  cinacalcet (SENSIPAR) 90 MG tablet Take 180 mg by mouth daily.    [provider]  traMADol (ULTRAM) 50 MG tablet Take 1 tablet (50 mg total) by mouth every 6 (six) hours as needed. Patient not taking: Reported on 03/20/2023 01/16/23 01/16/24  Ernestene Mention, PA-C     Critical care time: n/a      Posey Boyer, MSN, NP, AG-ACNP-BC [facility] 04/03/2023, 8:40 AM  See Amion for pager If no response to pager , please call [phone] until 7pm After 7:00 pm call Elink  [phone]

## 2023-04-03 NOTE — Progress Notes (Signed)
ANTICOAGULATION CONSULT NOTE - follow-up  Pharmacy Consult for heparin Indication: atrial fibrillation  Allergies  Allergen Reactions   Penicillins Other (See Comments)    UNSPECIFIED REACTION Childhood allergy    Doxazosin Mesylate     ineffective for BP (10/2014)   Triamterene-Hctz     increased creatinine (see 09/2014 office note)    Patient Measurements: Height: 5\' 6"  (167.6 cm) Weight: 98 kg (216 lb 0.8 oz) IBW/kg (Calculated) : 63.8 Heparin Dosing Weight: 85.6 kg  Vital Signs: Temp: 99.5 F (37.5 C) (07/04 0400) Temp Source: Oral (07/04 0400) BP: 116/55 (07/04 0459) Pulse Rate: 55 (07/04 0459)  Labs: Recent Labs    04/01/23 0851 04/01/23 1447 04/02/23 0340 04/02/23 0555 04/03/23 0140  HGB 13.8  --  12.9*  --  12.1*  HCT 40.6  --  37.0*  --  35.2*  PLT 85*  --  77*  --  87*  APTT 33  --   --   --  71*  LABPROT 19.0*  --   --   --   --   INR 1.6*  --   --   --   --   HEPARINUNFRC  --   --   --   --  >1.10*  CREATININE 13.88* 13.58*  --  7.96*  --      Estimated Creatinine Clearance: 8.9 mL/min (A) (by C-G formula based on SCr of 7.96 mg/dL (H)).   Medical History: Past Medical History:  Diagnosis Date   Anemia in chronic kidney disease (CKD)    CKD (chronic kidney disease) stage 5, GFR less than 15 ml/min (HCC)    M-W-F   Diabetes mellitus without complication (HCC) 03/2012   Hx type II - no meds, diet controlled, lost weight   ED (erectile dysfunction)    Heart murmur    not significant   History of kidney stones    passed 1 , 1 was removed with kidney disease in 2010   Hypercholesteremia    Hypertension    Iron deficiency    Kidney stone 2010   MVP (mitral valve prolapse)    Posterior with MR by ECHO   OSA (obstructive sleep apnea)    does not use cpap, lost weight    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   Syncope     Medications:  Medications Prior to Admission  Medication Sig Dispense Refill Last Dose    apixaban (ELIQUIS) 5 MG TABS tablet Take 1 tablet (5 mg total) by mouth 2 (two) times daily. 60 tablet 11    apixaban (ELIQUIS) 5 MG TABS tablet Take 1 tablet (5 mg total) by mouth 2 (two) times daily. 28 tablet 0    atorvastatin (LIPITOR) 10 MG tablet Take 10 mg by mouth at bedtime.      AURYXIA 1 GM 210 MG(Fe) tablet Take 420-630 mg by mouth See admin instructions. Take 630mg  by mouth three times daily with meals and take 420mg  by mouth with snacks.      cinacalcet (SENSIPAR) 90 MG tablet Take 180 mg by mouth daily.      traMADol (ULTRAM) 50 MG tablet Take 1 tablet (50 mg total) by mouth every 6 (six) hours as needed. (Patient not taking: Reported on 03/20/2023) 15 tablet 0    Scheduled:   (feeding supplement) PROSource Plus  30 mL Oral BID BM   atorvastatin  10 mg Oral QHS   bupivacaine(PF)  10 mL Infiltration Once  Chlorhexidine Gluconate Cloth  6 each Topical Daily   Chlorhexidine Gluconate Cloth  6 each Topical Q0600   cinacalcet  180 mg Oral Q breakfast   diltiazem  60 mg Oral Q12H   ferric citrate  630 mg Oral TID WC   multivitamin  1 tablet Oral QHS   pantoprazole (PROTONIX) IV  40 mg Intravenous Q12H    Assessment: 72 YOM w/ prior history of AF requiring heparin gtt. Last dose of apixaban given ~10 AM on 04/02/2023  7/4 AM update: HL >1.10- effects of apixaban aPTT 71 seconds (therapeutic) Hgb 12.1 / PLT 71  No signs of bleeding or issues with the heparin infusion  Goal of Therapy:  Heparin level 0.3-0.7 units/ml aPTT 66-102 seconds Monitor platelets by anticoagulation protocol: Yes   Plan:  Continue heparin infusion at 1200 units/hr Check aPTT confirmatory level at 0900 and then daily (aPTT / HL) while on heparin May stop aPTT when HL correlates Continue to monitor H&H and platelets  Peter [name] BS, PharmD, BCPS Clinical Pharmacist 04/03/2023 7:13 AM  Contact: [phone] after 3 PM  "Be curious, not judgmental..." -Debbora Dus

## 2023-04-03 NOTE — Significant Event (Signed)
Called by RN and informed that lab called with joint aspiration showing many WBC, primarily PMNs.  Sounds suspicious that this may be the source of his MSSA bacteremia?  Pt already on Unasyn, ill leave this ABx alone for now and defer any changes to ID who is following.

## 2023-04-04 ENCOUNTER — Inpatient Hospital Stay (HOSPITAL_COMMUNITY): Payer: Medicare Other

## 2023-04-04 ENCOUNTER — Ambulatory Visit (HOSPITAL_BASED_OUTPATIENT_CLINIC_OR_DEPARTMENT_OTHER): Payer: Self-pay | Admitting: Orthopaedic Surgery

## 2023-04-04 DIAGNOSIS — I341 Nonrheumatic mitral (valve) prolapse: Secondary | ICD-10-CM | POA: Diagnosis not present

## 2023-04-04 DIAGNOSIS — I4819 Other persistent atrial fibrillation: Secondary | ICD-10-CM | POA: Diagnosis not present

## 2023-04-04 DIAGNOSIS — M00012 Staphylococcal arthritis, left shoulder: Secondary | ICD-10-CM | POA: Diagnosis not present

## 2023-04-04 DIAGNOSIS — M00031 Staphylococcal arthritis, right wrist: Secondary | ICD-10-CM | POA: Diagnosis not present

## 2023-04-04 DIAGNOSIS — I493 Ventricular premature depolarization: Secondary | ICD-10-CM

## 2023-04-04 DIAGNOSIS — J690 Pneumonitis due to inhalation of food and vomit: Secondary | ICD-10-CM | POA: Diagnosis not present

## 2023-04-04 DIAGNOSIS — R7881 Bacteremia: Secondary | ICD-10-CM | POA: Diagnosis not present

## 2023-04-04 DIAGNOSIS — M00061 Staphylococcal arthritis, right knee: Secondary | ICD-10-CM | POA: Diagnosis not present

## 2023-04-04 DIAGNOSIS — M00011 Staphylococcal arthritis, right shoulder: Secondary | ICD-10-CM

## 2023-04-04 DIAGNOSIS — B9561 Methicillin susceptible Staphylococcus aureus infection as the cause of diseases classified elsewhere: Secondary | ICD-10-CM | POA: Diagnosis not present

## 2023-04-04 LAB — CULTURE, BLOOD (ROUTINE X 2): Special Requests: ADEQUATE

## 2023-04-04 LAB — CBC WITH DIFFERENTIAL/PLATELET
Abs Immature Granulocytes: 0.3 10*3/uL — ABNORMAL HIGH (ref 0.00–0.07)
Basophils Absolute: 0 10*3/uL (ref 0.0–0.1)
Basophils Relative: 0 %
Eosinophils Absolute: 0.1 10*3/uL (ref 0.0–0.5)
Eosinophils Relative: 2 %
HCT: 33.5 % — ABNORMAL LOW (ref 39.0–52.0)
Hemoglobin: 11.7 g/dL — ABNORMAL LOW (ref 13.0–17.0)
Immature Granulocytes: 3 %
Lymphocytes Relative: 11 %
Lymphs Abs: 1 10*3/uL (ref 0.7–4.0)
MCH: 33.6 pg (ref 26.0–34.0)
MCHC: 34.9 g/dL (ref 30.0–36.0)
MCV: 96.3 fL (ref 80.0–100.0)
Monocytes Absolute: 0.7 10*3/uL (ref 0.1–1.0)
Monocytes Relative: 8 %
Neutro Abs: 6.7 10*3/uL (ref 1.7–7.7)
Neutrophils Relative %: 76 %
Platelets: 98 10*3/uL — ABNORMAL LOW (ref 150–400)
RBC: 3.48 MIL/uL — ABNORMAL LOW (ref 4.22–5.81)
RDW: 14.2 % (ref 11.5–15.5)
WBC: 8.8 10*3/uL (ref 4.0–10.5)
nRBC: 0 % (ref 0.0–0.2)

## 2023-04-04 LAB — COMPREHENSIVE METABOLIC PANEL
ALT: 92 U/L — ABNORMAL HIGH (ref 0–44)
AST: 230 U/L — ABNORMAL HIGH (ref 15–41)
Albumin: 1.7 g/dL — ABNORMAL LOW (ref 3.5–5.0)
Alkaline Phosphatase: 48 U/L (ref 38–126)
Anion gap: 14 (ref 5–15)
BUN: 77 mg/dL — ABNORMAL HIGH (ref 8–23)
CO2: 24 mmol/L (ref 22–32)
Calcium: 8.2 mg/dL — ABNORMAL LOW (ref 8.9–10.3)
Chloride: 89 mmol/L — ABNORMAL LOW (ref 98–111)
Creatinine, Ser: 8.18 mg/dL — ABNORMAL HIGH (ref 0.61–1.24)
GFR, Estimated: 6 mL/min — ABNORMAL LOW (ref >60)
Glucose, Bld: 108 mg/dL — ABNORMAL HIGH (ref 70–99)
Potassium: 4.1 mmol/L (ref 3.5–5.1)
Sodium: 127 mmol/L — ABNORMAL LOW (ref 135–145)
Total Bilirubin: 3.4 mg/dL — ABNORMAL HIGH (ref 0.3–1.2)
Total Protein: 5.9 g/dL — ABNORMAL LOW (ref 6.5–8.1)

## 2023-04-04 LAB — PROCALCITONIN: Procalcitonin: 32.52 ng/mL

## 2023-04-04 LAB — APTT: aPTT: 78 seconds — ABNORMAL HIGH (ref 24–36)

## 2023-04-04 LAB — HEPARIN LEVEL (UNFRACTIONATED): Heparin Unfractionated: >1.1 IU/mL — ABNORMAL HIGH (ref 0.30–0.70)

## 2023-04-04 LAB — BRAIN NATRIURETIC PEPTIDE: B Natriuretic Peptide: 112.2 pg/mL — ABNORMAL HIGH (ref 0.0–100.0)

## 2023-04-04 LAB — MAGNESIUM: Magnesium: 2.3 mg/dL (ref 1.7–2.4)

## 2023-04-04 LAB — C-REACTIVE PROTEIN: CRP: 40.7 mg/dL — ABNORMAL HIGH (ref <1.0)

## 2023-04-04 MED ORDER — HYDROMORPHONE HCL 1 MG/ML IJ SOLN: 1.0000 mg | Freq: Once | INTRAMUSCULAR | Status: AC

## 2023-04-04 MED ORDER — METOPROLOL TARTRATE 12.5 MG HALF TABLET
12.5000 mg | ORAL_TABLET | Freq: Two times a day (BID) | ORAL | Status: DC
  Administered 2023-04-04 – 2023-04-05 (×3): 12.5 mg via ORAL
  Filled 2023-04-04 (×5): qty 1

## 2023-04-04 MED ORDER — IOHEXOL 350 MG/ML SOLN
75.0000 mL | Freq: Once | INTRAVENOUS | Status: AC | PRN
  Administered 2023-04-04: 75 mL via INTRAVENOUS

## 2023-04-04 MED ORDER — OXYCODONE HCL 5 MG PO TABS
5.0000 mg | ORAL_TABLET | ORAL | Status: DC | PRN
  Administered 2023-04-06 – 2023-04-17 (×22): 5 mg via ORAL
  Filled 2023-04-04 (×24): qty 1

## 2023-04-04 MED ORDER — HYDROMORPHONE HCL 1 MG/ML IJ SOLN
INTRAMUSCULAR | Status: AC
  Administered 2023-04-04: 1 mg via INTRAVENOUS
  Filled 2023-04-04: qty 1

## 2023-04-04 NOTE — Progress Notes (Signed)
PROGRESS NOTE                                                                                                                                                                                                             Patient Demographics:    Peter [name], is a 72 y.o. male, DOB - [birth year], ZOX:[id number]  Outpatient Primary MD for the patient is [name], MD    LOS - 3  Admit date - 04/01/2023    Chief Complaint  Patient presents with   Fever   Emesis       Brief Narrative (HPI from H&P)   72 y.o. male with medical history significant of ESRD on HD MWF, Type 2 DM  diet controlled,  anemia of CKD, hypertension, paroxysmal atrial fibrillation on Eliquis, hyperlipidemia, was brought in for generalized weakness, apparently patient was doing fine up till 2 days ago, sometime on Sunday he started developing nausea vomiting generalized weakness, had a fall, low-grade fever, subsequently brought to the ER where he was found to be septic and admitted to the hospital.   Subjective:   Patient in bed says he feels little better denies any headache but diffuse neck pain, pain in both shoulders, right wrist, right knee.  Still appears tired and weak.   Assessment  & Plan :    Sepsis present on admission with Staph aureus bacteremia 1 out of 2, nausea vomiting and syncope at home.  No clear source of infection.  ID to see the patient shortly, source of infection not clear, for now cover with Ancef, stable CT scan abdomen pelvis nausea vomiting per [facility] likely due to his infection and sepsis, ID following, underwent MRI C-spine, left shoulder with nonspecific findings, echocardiogram with nonspecific changes and mitral valve, blood cultures growing MSSA, arthrocentesis of right knee prelim cultures also likely growing Staph aureus, has multiple joint pains and aches appears toxic clinically I suspect him to have endocarditis.   Currently I have consulted ID, PCCM as he still appears tenuous on a daily basis, also consulted cardiology, I have also requested TEE which will be done as soon as we can schedule it.   Aspiration pneumonia.  Antibiotics per ID currently on nafcillin only.  Changes being made by ID based on their assessment.  ESRD.  On MWF schedule.  Nephrology on board.  Right arm AV graft.  Fall without syncope.  Has soft tissue injurie, multiple joint pains including C-spine discomfort, bilateral shoulder pains, right knee and right wrist pain.  Orthopedics on board.  Underwent right knee arthrocentesis with prelim cultures growing MSSA, right shoulder aspiration also being considered by orthopedics.  Right knee pain, bilateral shoulder discomfort, right wrist and neck pain.  XRay nonacute, orthopedic following, kindly see above.  GERD.  IV PPI.  Paroxysmal atrial fibrillation [place] vas 2 score of 3.  Switch Eliquis to heparin till clinical situation stabilizes, placed on low-dose Cardizem p.o. and as needed IV.  Toxic encephalopathy due to sepsis.  Stable but overall still tenuous, minimal improvement.  Mild asymptomatic transaminitis.  Gegick of acute hepatitis panel, likely shock liver, trend stable, GI on board,.  CT abdomen pelvis nonacute.  Bilirubin stable.       Condition - Extremely Guarded  Family Communication  : Family friend, POA at bedside on 04/02/2023  Code Status : Full code  Consults  : Nephrology, GI, ID, Ortho, PCCM, cardiology, palliative care  PUD Prophylaxis :  PPI   Procedures  :      MRI left shoulder and C-spine.    Echocardiogram.   1. Left ventricular ejection fraction, by estimation, is 60 to 65%. The left ventricle has normal function. The left ventricle has no regional wall motion abnormalities. There is severe left ventricular hypertrophy. Left ventricular diastolic parameters  are indeterminate.  2. Right ventricular systolic function is normal. The right  ventricular size is normal.  3. Left atrial size was moderately dilated.  4. Mild appearing MR but some splay artifact cannot r/o some posterior leaflet prolapse in addition to MAC with anteriorly directed MR Given bacteremia and abnormal posterior mitral annulus suggest TEE to further assess if clnically indicated. The mitral valve is degenerative. Mild mitral valve regurgitation. No evidence of mitral stenosis. Moderate mitral annular calcification.  5. The aortic valve is tricuspid. There is moderate calcification of the aortic valve. There is moderate thickening of the aortic valve. Aortic valve regurgitation is not visualized. Mild aortic valve stenosis.  6. The inferior vena cava is dilated in size with >50% respiratory variability, suggesting right atrial pressure of 8 mmHg.  CT scan head and C-spine.  1.  No evidence of an acute intracranial abnormality. 2. Mild generalized parenchymal atrophy. 3. Paranasal sinus disease as described. CT cervical spine: 1. No evidence of an acute cervical spine fracture. 2. Cervical spondylosis as outlined. Notably at C2-C3 and C3-C4, central disc protrusions contribute to at least moderate spinal canal stenosis. Multilevel bony neural from narrowing.   Lower extremity bilateral venous duplex.  No DVT.    Right knee arthrocentesis.  5000 WBCs predominantly neutrophils, cultures pending.    CT abdomen pelvis - 1. No acute finding by CT. 2. Atrophic kidneys, more so on the left than the right. 3. Diverticulosis without evidence of diverticulitis. 4. Cardiomegaly and coronary artery calcification. 5. Mild dependent atelectasis at the lung bases.      Disposition Plan  :    Status is: Inpatient   DVT Prophylaxis  :  Hep gtt >> Eliquis  Place and maintain sequential compression device Start: 04/02/23 1116    Lab Results  Component Value Date   PLT 98 (L) 04/04/2023    Diet :  Diet Order             Diet NPO time specified Except for: Sips with Meds   Diet effective midnight  Inpatient Medications  Scheduled Meds:  (feeding supplement) PROSource Plus  30 mL Oral BID BM   atorvastatin  10 mg Oral QHS   bupivacaine(PF)  10 mL Infiltration Once   Chlorhexidine Gluconate Cloth  6 each Topical Daily   Chlorhexidine Gluconate Cloth  6 each Topical Q0600   cinacalcet  180 mg Oral Q breakfast   ferric citrate  630 mg Oral TID WC   metoprolol tartrate  12.5 mg Oral BID   multivitamin  1 tablet Oral QHS   pantoprazole (PROTONIX) IV  40 mg Intravenous Q12H   Continuous Infusions:  heparin 1,200 Units/hr (04/03/23 1733)   nafcillin IV 2 g (04/04/23 0455)   PRN Meds:.acetaminophen, diltiazem, lidocaine (PF), mouth rinse  Antibiotics  :    Anti-infectives (From admission, onward)    Start     Dose/Rate Route Frequency Ordered Stop   04/03/23 1200  nafcillin 2 g in sodium chloride 0.9 % 100 mL IVPB        2 g 216 mL/hr over 30 Minutes Intravenous Every 4 hours 04/03/23 1112     04/03/23 1145  nafcillin injection 2 g  Status:  Discontinued        2 g Intravenous Every 4 hours 04/03/23 1050 04/03/23 1112   04/02/23 1245  Ampicillin-Sulbactam (UNASYN) 3 g in sodium chloride 0.9 % 100 mL IVPB  Status:  Discontinued        3 g 200 mL/hr over 30 Minutes Intravenous Daily 04/02/23 1152 04/03/23 1050   04/01/23 2200  ceFAZolin (ANCEF) IVPB 1 g/50 mL premix  Status:  Discontinued        1 g 100 mL/hr over 30 Minutes Intravenous Every 24 hours 04/01/23 2039 04/02/23 1146   04/01/23 0830  cefTRIAXone (ROCEPHIN) 2 g in sodium chloride 0.9 % 100 mL IVPB  Status:  Discontinued        2 g 200 mL/hr over 30 Minutes Intravenous Every 24 hours 04/01/23 0823 04/01/23 2039   04/01/23 0830  azithromycin (ZITHROMAX) 500 mg in sodium chloride 0.9 % 250 mL IVPB  Status:  Discontinued        500 mg 250 mL/hr over 60 Minutes Intravenous Every 24 hours 04/01/23 0823 04/01/23 2039         Objective:   Vitals:   04/03/23 2222  04/03/23 2241 04/03/23 2304 04/04/23 0356  BP: (!) 115/49 (!) 118/46 (!) 107/46 115/65  Pulse: 60  60 67  Resp: 18  20 17   Temp:   98.2 F (36.8 C) 98 F (36.7 C)  TempSrc:   Oral Oral  SpO2: 91%  90% 90%  Weight:      Height:        Wt Readings from Last 3 Encounters:  04/02/23 98 kg  03/20/23 99.4 kg  02/26/23 96.8 kg     Intake/Output Summary (Last 24 hours) at 04/04/2023 0821 Last data filed at 04/04/2023 0500 Gross per 24 hour  Intake 245.4 ml  Output --  Net 245.4 ml     Physical Exam  Awake Alert but appears lethargic, No new F.N deficits, Normal affect [place].AT,PERRAL Supple Neck, No JVD,   Symmetrical Chest wall movement, Good air movement bilaterally, CTAB RRR,No Gallops,Rubs or new Murmurs,  +ve B.Sounds, Abd Soft, No tenderness,   Diffuse tenderness in right knee, left shoulder, right wrist, now right shoulder as well long with C-spine tenderness        Data Review:    Recent Labs  Lab 04/01/23 [phone]  04/02/23 0340 04/03/23 0140 04/04/23 0441  WBC 13.8* 9.0 8.8 8.8  HGB 13.8 12.9* 12.1* 11.7*  HCT 40.6 37.0* 35.2* 33.5*  PLT 85* 77* 87* 98*  MCV 98.8 97.4 97.2 96.3  MCH 33.6 33.9 33.4 33.6  MCHC 34.0 34.9 34.4 34.9  RDW 14.2 14.4 14.3 14.2  LYMPHSABS 0.6* 0.5* 0.7 1.0  MONOABS 0.9 0.8 0.8 0.7  EOSABS 0.0 0.1 0.0 0.1  BASOSABS 0.0 0.1 0.0 0.0    Recent Labs  Lab 04/01/23 0851 04/01/23 1447 04/01/23 1448 04/01/23 2315 04/02/23 0340 04/02/23 0555 04/02/23 0556 04/03/23 0140 04/03/23 1058 04/03/23 2115 04/04/23 0441  NA 129* 127*  --   --   --  131*  --   --   --  130* 127*  K 5.3* 5.6*  --   --   --  5.2*  --   --   --  4.4 4.1  CL 91* 93*  --   --   --  90*  --   --   --  87* 89*  CO2 18* 14*  --   --   --  21*  --   --   --  25 24  ANIONGAP 20* 20*  --   --   --  20*  --   --   --  18* 14  GLUCOSE 136* 125*  --   --   --  117*  --   --   --  143* 108*  BUN 102* 104*  --   --   --  58*  --   --   --  68* 77*  CREATININE 13.88* 13.58*   --   --   --  7.96*  --   --   --  7.58* 8.18*  AST 147* 172*  --   --   --   --   --   --  158*  --  230*  ALT 56* 65*  --   --   --   --   --   --  76*  --  92*  ALKPHOS 47 39  --   --   --   --   --   --  45  --  48  BILITOT 1.1 1.0  --   --   --   --   --   --  1.3*  --  3.4*  ALBUMIN 2.8* 2.5*  --   --   --  2.3*  --   --  1.9*  --  1.7*  CRP  --   --   --   --   --   --  43.0* 43.8*  --   --  40.7*  PROCALCITON  --   --   --   --   --   --  37.09 38.83  --   --  32.52  LATICACIDVEN 4.6*  --  4.0* 3.9*  --   --   --   --   --   --   --   INR 1.6*  --   --   --   --   --   --   --   --   --   --   AMMONIA  --   --   --   --  28  --   --   --  20  --   --   BNP  --   --   --   --   --   --   --  136.6*  --   --  112.2*  MG  --   --   --   --   --   --   --   --   --  2.2 2.3  CALCIUM 9.8 9.1  --   --   --  9.1  --   --   --  8.7* 8.2*      Recent Labs  Lab 04/01/23 0851 04/01/23 1447 04/01/23 1448 04/01/23 2315 04/02/23 0340 04/02/23 0555 04/02/23 0556 04/03/23 0140 04/03/23 1058 04/03/23 2115 04/04/23 0441  CRP  --   --   --   --   --   --  43.0* 43.8*  --   --  40.7*  PROCALCITON  --   --   --   --   --   --  37.09 38.83  --   --  32.52  LATICACIDVEN 4.6*  --  4.0* 3.9*  --   --   --   --   --   --   --   INR 1.6*  --   --   --   --   --   --   --   --   --   --   AMMONIA  --   --   --   --  28  --   --   --  20  --   --   BNP  --   --   --   --   --   --   --  136.6*  --   --  112.2*  MG  --   --   --   --   --   --   --   --   --  2.2 2.3  CALCIUM 9.8 9.1  --   --   --  9.1  --   --   --  8.7* 8.2*     Micro Results Recent Results (from the past 240 hour(s))  Resp panel by RT-PCR (RSV, Flu A&B, Covid) Anterior Nasal Swab     Status: None   Collection Time: 04/01/23  8:23 AM   Specimen: Anterior Nasal Swab  Result Value Ref Range Status   SARS Coronavirus 2 by RT PCR NEGATIVE NEGATIVE Final   Influenza A by PCR NEGATIVE NEGATIVE Final   Influenza B by PCR  NEGATIVE NEGATIVE Final    Comment: (NOTE) The Xpert Xpress SARS-CoV-2/FLU/RSV plus assay is intended as an aid in the diagnosis of influenza from Nasopharyngeal swab specimens and should not be used as a sole basis for treatment. Nasal washings and aspirates are unacceptable for Xpert Xpress SARS-CoV-2/FLU/RSV testing.  Fact Sheet for Patients: BloggerCourse.com  Fact Sheet for Healthcare Providers: SeriousBroker.it  This test is not yet approved or cleared by the [place] FDA and has been authorized for detection and/or diagnosis of SARS-CoV-2 by FDA under an Emergency Use Authorization (EUA). This EUA will remain in effect (meaning this test can be used) for the duration of the COVID-19 declaration under Section 564(b)(1) of the Act, 21 U.S.C. section 360bbb-3(b)(1), unless the authorization is terminated or revoked.     Resp Syncytial Virus by PCR NEGATIVE NEGATIVE Final    Comment: (NOTE) Fact Sheet for Patients: BloggerCourse.com  Fact Sheet for Healthcare Providers: SeriousBroker.it  This test is not yet approved or cleared by the [place] FDA and has been authorized for detection and/or diagnosis of SARS-CoV-2 by FDA under an Emergency  Use Authorization (EUA). This EUA will remain in effect (meaning this test can be used) for the duration of the COVID-19 declaration under Section 564(b)(1) of the Act, 21 U.S.C. section 360bbb-3(b)(1), unless the authorization is terminated or revoked.  Performed at [facility], [address], [place], [place] [address]   Blood Culture (routine x 2)     Status: Abnormal   Collection Time: 04/01/23  8:51 AM   Specimen: BLOOD LEFT FOREARM  Result Value Ref Range Status   Specimen Description BLOOD LEFT FOREARM  Final   Special Requests   Final    BOTTLES DRAWN AEROBIC AND ANAEROBIC Blood Culture results may not be  optimal due to an excessive volume of blood received in culture bottles   Culture  Setup Time   Final    GRAM POSITIVE COCCI IN CLUSTERS IN BOTH AEROBIC AND ANAEROBIC BOTTLES CRITICAL RESULT CALLED TO, READ BACK BY AND VERIFIED WITH: PHARMD HAILEY VONDOLEN ON 04/01/23 @ 2017 BY DRT Performed at [facility], [address] [place], [place] [address]    Culture STAPHYLOCOCCUS AUREUS (A)  Final   Report Status 04/03/2023 FINAL  Final   Organism ID, Bacteria STAPHYLOCOCCUS AUREUS  Final      Susceptibility   Staphylococcus aureus - MIC*    CIPROFLOXACIN <=0.5 SENSITIVE Sensitive     ERYTHROMYCIN <=0.25 SENSITIVE Sensitive     GENTAMICIN <=0.5 SENSITIVE Sensitive     OXACILLIN <=0.25 SENSITIVE Sensitive     TETRACYCLINE <=1 SENSITIVE Sensitive     VANCOMYCIN 1 SENSITIVE Sensitive     TRIMETH/SULFA <=10 SENSITIVE Sensitive     CLINDAMYCIN <=0.25 SENSITIVE Sensitive     RIFAMPIN <=0.5 SENSITIVE Sensitive     Inducible Clindamycin NEGATIVE Sensitive     LINEZOLID 2 SENSITIVE Sensitive     * STAPHYLOCOCCUS AUREUS  Blood Culture ID Panel (Reflexed)     Status: Abnormal   Collection Time: 04/01/23  8:51 AM  Result Value Ref Range Status   Enterococcus faecalis NOT DETECTED NOT DETECTED Final   Enterococcus Faecium NOT DETECTED NOT DETECTED Final   Listeria monocytogenes NOT DETECTED NOT DETECTED Final   Staphylococcus species DETECTED (A) NOT DETECTED Final    Comment: CRITICAL RESULT CALLED TO, READ BACK BY AND VERIFIED WITH: PHARMD HAILEY VONDOLEN ON 04/01/23 @ 2017 BY DRT    Staphylococcus aureus (BCID) DETECTED (A) NOT DETECTED Final    Comment: CRITICAL RESULT CALLED TO, READ BACK BY AND VERIFIED WITH: PHARMD HAILEY VONDOLEN ON 04/01/23 @ 2017 BY DRT    Staphylococcus epidermidis NOT DETECTED NOT DETECTED Final   Staphylococcus lugdunensis NOT DETECTED NOT DETECTED Final   Streptococcus species NOT DETECTED NOT DETECTED Final   Streptococcus agalactiae NOT DETECTED NOT DETECTED  Final   Streptococcus pneumoniae NOT DETECTED NOT DETECTED Final   Streptococcus pyogenes NOT DETECTED NOT DETECTED Final   A.calcoaceticus-baumannii NOT DETECTED NOT DETECTED Final   Bacteroides fragilis NOT DETECTED NOT DETECTED Final   Enterobacterales NOT DETECTED NOT DETECTED Final   Enterobacter cloacae complex NOT DETECTED NOT DETECTED Final   Escherichia coli NOT DETECTED NOT DETECTED Final   Klebsiella aerogenes NOT DETECTED NOT DETECTED Final   Klebsiella oxytoca NOT DETECTED NOT DETECTED Final   Klebsiella pneumoniae NOT DETECTED NOT DETECTED Final   Proteus species NOT DETECTED NOT DETECTED Final   Salmonella species NOT DETECTED NOT DETECTED Final   Serratia marcescens NOT DETECTED NOT DETECTED Final   Haemophilus influenzae NOT DETECTED NOT DETECTED Final  Neisseria meningitidis NOT DETECTED NOT DETECTED Final   Pseudomonas aeruginosa NOT DETECTED NOT DETECTED Final   Stenotrophomonas maltophilia NOT DETECTED NOT DETECTED Final   Candida albicans NOT DETECTED NOT DETECTED Final   Candida auris NOT DETECTED NOT DETECTED Final   Candida glabrata NOT DETECTED NOT DETECTED Final   Candida krusei NOT DETECTED NOT DETECTED Final   Candida parapsilosis NOT DETECTED NOT DETECTED Final   Candida tropicalis NOT DETECTED NOT DETECTED Final   Cryptococcus neoformans/gattii NOT DETECTED NOT DETECTED Final   Meth resistant mecA/C and MREJ NOT DETECTED NOT DETECTED Final    Comment: Performed at [facility], [address] [place], [place] [address]  Blood Culture (routine x 2)     Status: None (Preliminary result)   Collection Time: 04/01/23  1:09 PM   Specimen: BLOOD LEFT HAND  Result Value Ref Range Status   Specimen Description BLOOD LEFT HAND  Final   Special Requests   Final    BOTTLES DRAWN AEROBIC AND ANAEROBIC Blood Culture adequate volume   Culture   Final    NO GROWTH 2 DAYS Performed at [facility], [address] [place], [place] [address]    Report  Status PENDING  Incomplete  Culture, blood (Routine X 2) w Reflex to ID Panel     Status: None (Preliminary result)   Collection Time: 04/01/23 11:15 PM   Specimen: BLOOD  Result Value Ref Range Status   Specimen Description BLOOD BLOOD LEFT ARM  Final   Special Requests   Final    BOTTLES DRAWN AEROBIC AND ANAEROBIC Blood Culture results may not be optimal due to an inadequate volume of blood received in culture bottles   Culture   Final    NO GROWTH 2 DAYS Performed at [facility], [address] [place], [place] [address]    Report Status PENDING  Incomplete  Culture, blood (Routine X 2) w Reflex to ID Panel     Status: None (Preliminary result)   Collection Time: 04/01/23 11:21 PM   Specimen: BLOOD  Result Value Ref Range Status   Specimen Description BLOOD BLOOD LEFT HAND  Final   Special Requests   Final    BOTTLES DRAWN AEROBIC AND ANAEROBIC Blood Culture results may not be optimal due to an inadequate volume of blood received in culture bottles   Culture   Final    NO GROWTH 2 DAYS Performed at [facility], [address] [place], [place] [address]    Report Status PENDING  Incomplete  MRSA Next Gen by PCR, Nasal     Status: None   Collection Time: 04/02/23  5:42 AM   Specimen: Nasal Mucosa; Nasal Swab  Result Value Ref Range Status   MRSA by PCR Next Gen NOT DETECTED NOT DETECTED Final    Comment: (NOTE) The GeneXpert MRSA Assay (FDA approved for NASAL specimens only), is one component of a comprehensive MRSA colonization surveillance program. It is not intended to diagnose MRSA infection nor to guide or monitor treatment for MRSA infections. Test performance is not FDA approved in patients less than 68 years old. Performed at [facility], [address], [place], [place] [address]   Body fluid culture w Gram Stain     Status: None (Preliminary result)   Collection Time: 04/02/23  9:13 PM   Specimen: Body Fluid  Result Value Ref Range Status    Specimen Description FLUID SYNOVIAL RIGHT KNEE  Final  Special Requests NONE  Final   Gram Stain   Final    ABUNDANT WBC PRESENT, PREDOMINANTLY PMN RARE GRAM POSITIVE COCCI INTRACELLULAR Gram Stain Report Called to,Read Back By and Verified With: RN [email or web address] [id number] @ 2258 FH    Culture   Final    RARE STAPHYLOCOCCUS AUREUS CULTURE REINCUBATED FOR BETTER GROWTH Performed at [facility], [address] [place], [place] [address]    Report Status PENDING  Incomplete    Radiology Reports ECHOCARDIOGRAM COMPLETE  Result Date: 04/03/2023    ECHOCARDIOGRAM REPORT   Patient Name:   JOATHAN DAVID Klump Date of Exam: 04/03/2023 Medical Rec #:  [id number]         Height:       66.0 in Accession #:    [id number]        Weight:       216.0 lb Date of Birth:  April 06, 1951         BSA:          2.067 m Patient Age:    72 years          BP:           106/33 mmHg Patient Gender: M                 HR:           110 bpm. Exam Location:  Inpatient Procedure: 2D Echo, Cardiac Doppler and Color Doppler Indications:    Bacteremia R78.81  History:        Patient has prior history of Echocardiogram examinations, most                 recent 05/30/2018. CHF, Arrythmias:Atrial Fibrillation and                 Bradycardia, Signs/Symptoms:Shortness of Breath and Syncope;                 Risk Factors:Hypertension, Sleep Apnea and Diabetes. ESRD,                 Arteriovenous fistula.  Sonographer:    Lucendia Herrlich Referring Phys: [id number] CORNELIUS N VAN DAM IMPRESSIONS  1. Left ventricular ejection fraction, by estimation, is 60 to 65%. The left ventricle has normal function. The left ventricle has no regional wall motion abnormalities. There is severe left ventricular hypertrophy. Left ventricular diastolic parameters  are indeterminate.  2. Right ventricular systolic function is normal. The right ventricular size is normal.  3. Left atrial size was moderately dilated.  4. Mild appearing MR but some splay artifact cannot  r/o some posterior leaflet prolapse in addition to MAC with anteriorly directed MR Given bacteremia and abnormal posterior mitral annulus suggest TEE to further assess if clnically indicated. The mitral valve is degenerative. Mild mitral valve regurgitation. No evidence of mitral stenosis. Moderate mitral annular calcification.  5. The aortic valve is tricuspid. There is moderate calcification of the aortic valve. There is moderate thickening of the aortic valve. Aortic valve regurgitation is not visualized. Mild aortic valve stenosis.  6. The inferior vena cava is dilated in size with >50% respiratory variability, suggesting right atrial pressure of 8 mmHg. FINDINGS  Left Ventricle: Left ventricular ejection fraction, by estimation, is 60 to 65%. The left ventricle has normal function. The left ventricle has no regional wall motion abnormalities. The left ventricular internal cavity size was normal in size. There is  severe left ventricular hypertrophy. Left ventricular diastolic parameters are indeterminate. Right  Ventricle: The right ventricular size is normal. No increase in right ventricular wall thickness. Right ventricular systolic function is normal. Left Atrium: Left atrial size was moderately dilated. Right Atrium: Right atrial size was normal in size. Pericardium: There is no evidence of pericardial effusion. Mitral Valve: Mild appearing MR but some splay artifact cannot r/o some posterior leaflet prolapse in addition to MAC with anteriorly directed MR Given bacteremia and abnormal posterior mitral annulus suggest TEE to further assess if clnically indicated.  The mitral valve is degenerative in appearance. There is moderate thickening of the mitral valve leaflet(s). There is moderate calcification of the mitral valve leaflet(s). Moderate mitral annular calcification. Mild mitral valve regurgitation. No evidence of mitral valve stenosis. Tricuspid Valve: The tricuspid valve is normal in structure.  Tricuspid valve regurgitation is mild . No evidence of tricuspid stenosis. Aortic Valve: The aortic valve is tricuspid. There is moderate calcification of the aortic valve. There is moderate thickening of the aortic valve. Aortic valve regurgitation is not visualized. Mild aortic stenosis is present. Aortic valve mean gradient measures 9.0 mmHg. Aortic valve peak gradient measures 16.6 mmHg. Aortic valve area, by VTI measures 1.54 cm. Pulmonic Valve: The pulmonic valve was normal in structure. Pulmonic valve regurgitation is not visualized. No evidence of pulmonic stenosis. Aorta: The aortic root is normal in size and structure. Venous: The inferior vena cava is dilated in size with greater than 50% respiratory variability, suggesting right atrial pressure of 8 mmHg. IAS/Shunts: No atrial level shunt detected by color flow Doppler.  LEFT VENTRICLE PLAX 2D LVIDd:         4.00 cm   Diastology LVIDs:         2.60 cm   LV e' lateral:   13.40 cm/s LV PW:         1.50 cm   LV E/e' lateral: 8.8 LV IVS:        1.80 cm LVOT diam:     2.30 cm LV SV:         51 LV SV Index:   24 LVOT Area:     4.15 cm  RIGHT VENTRICLE             IVC RV S prime:     15.20 cm/s  IVC diam: 2.10 cm TAPSE (M-mode): 1.9 cm LEFT ATRIUM            Index        RIGHT ATRIUM           Index LA diam:      4.50 cm  2.18 cm/m   RA Area:     19.40 cm LA Vol (A2C): 104.0 ml 50.33 ml/m  RA Volume:   46.80 ml  22.65 ml/m LA Vol (A4C): 128.0 ml 61.94 ml/m  AORTIC VALVE AV Area (Vmax):    1.77 cm AV Area (Vmean):   1.67 cm AV Area (VTI):     1.54 cm AV Vmax:           204.00 cm/s AV Vmean:          137.000 cm/s AV VTI:            0.329 m AV Peak Grad:      16.6 mmHg AV Mean Grad:      9.0 mmHg LVOT Vmax:         87.00 cm/s LVOT Vmean:        55.067 cm/s LVOT VTI:  0.122 m LVOT/AV VTI ratio: 0.37  AORTA Ao Root diam: 3.70 cm Ao Asc diam:  3.90 cm MITRAL VALVE                TRICUSPID VALVE MV Area (PHT): 4.15 cm     TR Peak grad:   30.7 mmHg  MV Decel Time: 183 msec     TR Vmax:        277.00 cm/s MR Peak grad: 31.4 mmHg MR Vmax:      280.00 cm/s   SHUNTS MV E velocity: 118.00 cm/s  Systemic VTI:  0.12 m MV A velocity: 45.30 cm/s   Systemic Diam: 2.30 cm MV E/A ratio:  2.60 Charlton Haws MD Electronically signed by Charlton Haws MD Signature Date/Time: 04/03/2023/5:07:21 PM    Final    MR SHOULDER LEFT WO CONTRAST  Result Date: 04/03/2023 CLINICAL DATA:  Left shoulder pain. EXAM: MRI OF THE LEFT SHOULDER WITHOUT CONTRAST TECHNIQUE: Multiplanar, multisequence MR imaging of the shoulder was performed. No intravenous contrast was administered. COMPARISON:  None Available. FINDINGS: Rotator cuff: Complete tear of the supraspinatus and infraspinatus tendons with 5.7 cm of retraction. Teres minor tendon is intact. Moderate tendinosis of the subscapularis tendon with a small partial-thickness tear. Muscles: Severe atrophy of the supraspinatus, infraspinatus and subscapularis muscles. Biceps Long Head: Complete tear of the proximal long head of the biceps tendon. Acromioclavicular Joint: Moderate arthropathy of the acromioclavicular joint. Small amount of subacromial/subdeltoid bursal fluid. Glenohumeral Joint: Large joint effusion. Partial-thickness cartilage loss of the glenohumeral joint with areas of high-grade partial-thickness cartilage loss. Labrum: Generalized labral degeneration. Bones: No fracture or dislocation. No aggressive osseous lesion. Other: No fluid collection or hematoma. Muscle edema in the short of the biceps muscle and coracobrachialis muscle concerning for muscle strain. IMPRESSION: 1. Complete tear of the supraspinatus and infraspinatus tendons with 5.7 cm of retraction. 2. Moderate tendinosis of the subscapularis tendon with a small partial-thickness tear. 3. Complete tear of the proximal long head of the biceps tendon. 4. Moderate osteoarthritis of the glenohumeral joint. Large glenohumeral joint effusion. Electronically Signed   By:  Elige Ko M.D.   On: 04/03/2023 10:45   MR CERVICAL SPINE WO CONTRAST  Result Date: 04/03/2023 CLINICAL DATA:  In stage renal disease, cervical bone lesion. EXAM: MRI CERVICAL SPINE WITHOUT CONTRAST TECHNIQUE: Multiplanar, multisequence MR imaging of the cervical spine was performed. No intravenous contrast was administered. COMPARISON:  CT cervical spine 04/01/2023 FINDINGS: Alignment: Physiologic. Vertebrae: No acute fracture, evidence of discitis, or aggressive bone lesion. Cord: Normal signal and morphology. Posterior Fossa, vertebral arteries, paraspinal tissues: Posterior fossa demonstrates no focal abnormality. Vertebral artery flow voids are maintained. Paraspinal soft tissues are unremarkable. Disc levels: Discs: Degenerative disease with disc height loss at C4-5 and C5-6. Degenerative disease with disc height loss at C7-T1, T1-2 and T2-3. C2-3: Small central disc protrusion. Moderate right and mild left facet arthropathy. Mild bilateral foraminal stenosis. No spinal stenosis. C3-4: Broad-based disc bulge. Broad-based disc bulge with a small central disc protrusion. Moderate bilateral facet arthropathy. No severe bilateral foraminal stenosis. Severe spinal stenosis. C4-5: Broad-based disc bulge with a broad central disc protrusion. Severe spinal stenosis. Severe bilateral foraminal stenosis. C5-6: Mild broad-based disc bulge. Moderate right facet arthropathy. Severe bilateral foraminal stenosis. Mild spinal stenosis. C6-7: Mild broad-based disc bulge. Mild bilateral facet arthropathy. Severe right foraminal stenosis. Moderate left foraminal stenosis. No spinal stenosis. C7-T1: Minimal broad-based disc bulge. No foraminal or central canal stenosis. T1-2: Broad-based disc bulge. Moderate bilateral foraminal  stenosis. No spinal stenosis. Bilateral facet arthropathy. T2-3: Broad-based disc bulge. Bilateral facet arthropathy. Mild bilateral foraminal stenosis. IMPRESSION: 1. Diffuse cervical spine  spondylosis as described above. 2. No acute osseous injury of the cervical spine. 3. No aggressive osseous lesion of the cervical spine. 4. At C3-4 there is a broad-based disc bulge with a small central disc protrusion. Moderate bilateral facet arthropathy. No severe bilateral foraminal stenosis. Severe spinal stenosis. 5. At C4-5 there is a broad-based disc bulge with a broad central disc protrusion. Severe spinal stenosis. Severe bilateral foraminal stenosis. 6. At C5-6 there is a mild broad-based disc bulge. Moderate right facet arthropathy. Severe bilateral foraminal stenosis. Mild spinal stenosis. 7. At C6-7 there is a mild broad-based disc bulge. Mild bilateral facet arthropathy. Severe right foraminal stenosis. Moderate left foraminal stenosis. Electronically Signed   By: Elige Ko M.D.   On: 04/03/2023 10:40   VAS [place] LOWER EXTREMITY VENOUS (DVT)  Result Date: 04/02/2023  Lower Venous DVT Study Patient Name:  MARKEAL DELCONTE  Date of Exam:   04/02/2023 Medical Rec #: [id number]          Accession #:    [id number] Date of Birth: Jul 22, 1951          Patient Gender: M Patient Age:   72 years Exam Location:  [facility] Procedure:      VAS [place] LOWER EXTREMITY VENOUS (DVT) Referring Phys: Kathlen Mody --------------------------------------------------------------------------------  Indications: Swelling.  Limitations: Body habitus and poor ultrasound/tissue interface. Comparison Study: Previous 01/09/18 negative. Performing Technologist: McKayla Maag RVT, [place]  Examination Guidelines: A complete evaluation includes B-mode imaging, spectral Doppler, color Doppler, and power Doppler as needed of all accessible portions of each vessel. Bilateral testing is considered an integral part of a complete examination. Limited examinations for reoccurring indications may be performed as noted. The reflux portion of the exam is performed with the patient in reverse Trendelenburg.   +-----+---------------+---------+-----------+----------+--------------+ RIGHTCompressibilityPhasicitySpontaneityPropertiesThrombus Aging +-----+---------------+---------+-----------+----------+--------------+ CFV  Full           Yes      Yes                                 +-----+---------------+---------+-----------+----------+--------------+ SFJ  Full                                                        +-----+---------------+---------+-----------+----------+--------------+   +---------+---------------+---------+-----------+----------+-------------------+ LEFT     CompressibilityPhasicitySpontaneityPropertiesThrombus Aging      +---------+---------------+---------+-----------+----------+-------------------+ CFV      Full           Yes      Yes                                      +---------+---------------+---------+-----------+----------+-------------------+ SFJ      Full                                                             +---------+---------------+---------+-----------+----------+-------------------+ FV Prox  Full                                                             +---------+---------------+---------+-----------+----------+-------------------+  FV Mid   Full                                                             +---------+---------------+---------+-----------+----------+-------------------+ FV DistalFull                                                             +---------+---------------+---------+-----------+----------+-------------------+ PFV      Full                                                             +---------+---------------+---------+-----------+----------+-------------------+ POP      Full           Yes      Yes                                      +---------+---------------+---------+-----------+----------+-------------------+ PTV      Full                                                              +---------+---------------+---------+-----------+----------+-------------------+ PERO                                                  Not well visualized +---------+---------------+---------+-----------+----------+-------------------+    Summary: RIGHT: - No evidence of common femoral vein obstruction.  LEFT: - There is no evidence of deep vein thrombosis in the lower extremity. However, portions of this examination were limited- see technologist comments above.  - No cystic structure found in the popliteal fossa.  *See table(s) above for measurements and observations. Electronically signed by Coral Else MD on 04/02/2023 at 9:15:20 PM.    Final    CT ABDOMEN PELVIS W CONTRAST  Result Date: 04/02/2023 CLINICAL DATA:  Abdominal pain, acute, nonlocalized. End stage renal disease. Nausea and vomiting. Sepsis. EXAM: CT ABDOMEN AND PELVIS WITH CONTRAST TECHNIQUE: Multidetector CT imaging of the abdomen and pelvis was performed using the standard protocol following bolus administration of intravenous contrast. RADIATION DOSE REDUCTION: This exam was performed according to the departmental dose-optimization program which includes automated exposure control, adjustment of the mA and/or kV according to patient size and/or use of iterative reconstruction technique. CONTRAST:  75mL OMNIPAQUE IOHEXOL 350 MG/ML SOLN COMPARISON:  04/05/2009 FINDINGS: Lower chest: Mild dependent atelectasis at the lung bases. Tiny amount of pleural fluid. Non dependent lung as well aerated. There is cardiomegaly and coronary artery calcification. Hepatobiliary: No liver parenchymal abnormality. No calcified gallstones. No ductal dilatation. Pancreas: Normal Spleen: Normal Adrenals/Urinary Tract: Adrenal  glands are normal. Atrophic kidneys, more so on the left than the right. No hydroureteronephrosis. No stone or mass. Bladder is not well seen because of artifact from a left hip replacement. No bladder distension.  Stomach/Bowel: Stomach and small intestine are normal. Normal appendix. No abnormal colon finding. Diverticulosis without evidence of diverticulitis. Vascular/Lymphatic: Aorta and IVC are normal.  No adenopathy. Reproductive: Normal Other: No free fluid or air. Musculoskeletal: Ordinary mild degenerative change affects the spine. IMPRESSION: 1. No acute finding by CT. 2. Atrophic kidneys, more so on the left than the right. 3. Diverticulosis without evidence of diverticulitis. 4. Cardiomegaly and coronary artery calcification. 5. Mild dependent atelectasis at the lung bases. Tiny amount of pleural fluid. Electronically Signed   By: Paulina Fusi M.D.   On: 04/02/2023 13:01   DG Abd Portable 1V  Result Date: 04/02/2023 CLINICAL DATA:  Nausea and vomiting EXAM: PORTABLE ABDOMEN - 1 VIEW COMPARISON:  Abdominal CT 04/05/2009 FINDINGS: Normal bowel gas pattern. Stool at the rectum without generalized abnormal stool retention. No concerning mass effect or calcification. Cardiomegaly with clear lung bases. Generalized spondylosis. Unremarkable left hip replacement. IMPRESSION: Normal bowel gas pattern. Electronically Signed   By: Tiburcio Pea M.D.   On: 04/02/2023 06:59   DG Chest Port 1 View  Result Date: 04/02/2023 CLINICAL DATA:  Shortness of breath EXAM: PORTABLE CHEST 1 VIEW COMPARISON:  04/01/2023 FINDINGS: Chronic cardiomegaly. Stable mediastinal contours. Low volume but clear lungs with no effusion or pneumothorax. Artifact from EKG leads. IMPRESSION: Stable low volume chest with cardiomegaly. Electronically Signed   By: Tiburcio Pea M.D.   On: 04/02/2023 06:58   DG Shoulder Left  Result Date: 04/01/2023 CLINICAL DATA:  Pain after fall EXAM: LEFT SHOULDER - 2 VIEW COMPARISON:  None Available. FINDINGS: Osteopenia. Osteophyte formation of the glenohumeral joint and AC joint with some joint space loss. There is also elevation of the humeral head relative to the acromion. Please correlate for a  full-thickness rotator cuff tear. No acute fracture or dislocation. Surgical clips in the left axillary region. IMPRESSION: Moderate degenerative changes. High-riding humeral head. Please correlate for a full-thickness rotator cuff tear Electronically Signed   By: Karen Kays M.D.   On: 04/01/2023 11:04   DG Knee Complete 4 Views Right  Result Date: 04/01/2023 CLINICAL DATA:  Pain after fall EXAM: RIGHT KNEE - COMPLETE 4 VIEW COMPARISON:  None Available. FINDINGS: Moderate joint space loss of the medial compartment. Small osteophytes seen of all 3 compartments greatest of the patellofemoral joint. There is a moderate joint effusion. Hyperostosis. Osteopenia. No fracture or dislocation. IMPRESSION: Tricompartment degenerative changes identified, overall moderate greatest involving the medial compartment and patellofemoral joint. Moderate joint effusion of uncertain etiology. This has a broad differential. This could be related to the degenerative changes. However please correlate for history of trauma or infection. Electronically Signed   By: Karen Kays M.D.   On: 04/01/2023 11:03   DG Knee Complete 4 Views Left  Result Date: 04/01/2023 CLINICAL DATA:  Pain after fall.  Weakness for 2 days EXAM: LEFT KNEE - COMPLETE 4 VIEW COMPARISON:  None Available. FINDINGS: Osteopenia. Slight joint space loss of the medial compartment. Small osteophytes of all 3 compartments. No joint effusion on lateral view. No fracture or dislocation. Hyperostosis of the patella. Vascular calcifications. IMPRESSION: Mild degenerative changes.  No joint effusion. Electronically Signed   By: Karen Kays M.D.   On: 04/01/2023 11:00   CT Head Wo Contrast  Result Date: 04/01/2023 CLINICAL  DATA:  Provided history: Polytrauma, blunt. EXAM: CT HEAD WITHOUT CONTRAST CT CERVICAL SPINE WITHOUT CONTRAST TECHNIQUE: Multidetector CT imaging of the head and cervical spine was performed following the standard protocol without intravenous  contrast. Multiplanar CT image reconstructions of the cervical spine were also generated. RADIATION DOSE REDUCTION: This exam was performed according to the departmental dose-optimization program which includes automated exposure control, adjustment of the mA and/or kV according to patient size and/or use of iterative reconstruction technique. COMPARISON:  None. FINDINGS: CT HEAD FINDINGS Brain: Mild generalized parenchymal atrophy. There is no acute intracranial hemorrhage. No demarcated cortical infarct. No extra-axial fluid collection. No evidence of an intracranial mass. No midline shift. Vascular: No hyperdense vessel.  Atherosclerotic calcifications. Skull: No calvarial fracture or aggressive osseous lesion. Sinuses/Orbits: No mass or acute finding within the imaged orbits. Mild-to-moderate mucosal thickening, and small volume frothy secretions, within the right maxillary sinus. Small mucous retention cyst within the left maxillary sinus. CT CERVICAL SPINE FINDINGS Alignment: No significant spondylolisthesis. Skull base and vertebrae: The basion-dental and atlanto-dental intervals are maintained.No evidence of acute fracture to the cervical spine. Soft tissues and spinal canal: No prevertebral fluid or swelling. No visible canal hematoma. Disc levels: Cervical spondylosis with multilevel disc space narrowing, disc bulges/central disc protrusions, endplate spurring, uncovertebral hypertrophy and facet arthrosis. Disc space narrowing is greatest at C5-C6 (moderate/advanced), C7-T1 (moderate/advanced), T1-T2 (advanced) and T2-T3 (advanced). 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 Most notably at C2-C3 and C3-C4, central disc protrusions contribute to at least moderate spinal canal stenosis. Multilevel bony neural foraminal narrowing. Upper chest: No consolidation within the imaged lung apices. No visible pneumothorax. IMPRESSION: CT head: 1.  No evidence  of an acute intracranial abnormality. 2. Mild generalized parenchymal atrophy. 3. Paranasal sinus disease as described. CT cervical spine: 1. No evidence of an acute cervical spine fracture. 2. Cervical spondylosis as outlined. Notably at C2-C3 and C3-C4, central disc protrusions contribute to at least moderate spinal canal stenosis. Multilevel bony neural from narrowing. Electronically Signed   By: Jackey Loge D.O.   On: 04/01/2023 10:32   CT Cervical Spine Wo Contrast  Result Date: 04/01/2023 CLINICAL DATA:  Provided history: Polytrauma, blunt. EXAM: CT HEAD WITHOUT CONTRAST CT CERVICAL SPINE WITHOUT CONTRAST TECHNIQUE: Multidetector CT imaging of the head and cervical spine was performed following the standard protocol without intravenous contrast. Multiplanar CT image reconstructions of the cervical spine were also generated. RADIATION DOSE REDUCTION: This exam was performed according to the departmental dose-optimization program which includes automated exposure control, adjustment of the mA and/or kV according to patient size and/or use of iterative reconstruction technique. COMPARISON:  None. FINDINGS: CT HEAD FINDINGS Brain: Mild generalized parenchymal atrophy. There is no acute intracranial hemorrhage. No demarcated cortical infarct. No extra-axial fluid collection. No evidence of an intracranial mass. No midline shift. Vascular: No hyperdense vessel.  Atherosclerotic calcifications. Skull: No calvarial fracture or aggressive osseous lesion. Sinuses/Orbits: No mass or acute finding within the imaged orbits. Mild-to-moderate mucosal thickening, and small volume frothy secretions, within the right maxillary sinus. Small mucous retention cyst within the left maxillary sinus. CT CERVICAL SPINE FINDINGS Alignment: No significant spondylolisthesis. Skull base and vertebrae: The basion-dental and atlanto-dental intervals are maintained.No evidence of acute fracture to the cervical spine. Soft tissues and  spinal canal: No prevertebral fluid or swelling. No visible canal hematoma. Disc levels: Cervical spondylosis with multilevel disc space narrowing, disc bulges/central disc protrusions, endplate spurring, uncovertebral hypertrophy and facet arthrosis. Disc space  narrowing is greatest at C5-C6 (moderate/advanced), C7-T1 (moderate/advanced), T1-T2 (advanced) and T2-T3 (advanced). 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 Most notably at C2-C3 and C3-C4, central disc protrusions contribute to at least moderate spinal canal stenosis. Multilevel bony neural foraminal narrowing. Upper chest: No consolidation within the imaged lung apices. No visible pneumothorax. IMPRESSION: CT head: 1.  No evidence of an acute intracranial abnormality. 2. Mild generalized parenchymal atrophy. 3. Paranasal sinus disease as described. CT cervical spine: 1. No evidence of an acute cervical spine fracture. 2. Cervical spondylosis as outlined. Notably at C2-C3 and C3-C4, central disc protrusions contribute to at least moderate spinal canal stenosis. Multilevel bony neural from narrowing. Electronically Signed   By: Jackey Loge D.O.   On: 04/01/2023 10:32   DG Chest Portable 1 View  Result Date: 04/01/2023 CLINICAL DATA:  Shortness of breath EXAM: PORTABLE CHEST 1 VIEW COMPARISON:  Chest radiograph dated 06/09/2018 FINDINGS: The medial apices are obscured due to overlying mandible. Low lung volumes with bronchovascular crowding. Medial right lower lung patchy opacities. No pleural effusion or pneumothorax. Similar enlarged cardiomediastinal silhouette. No acute osseous abnormality. IMPRESSION: 1. Medial right lower lung patchy opacities, which may represent atelectasis, aspiration, or pneumonia. 2. Similar cardiomegaly. Electronically Signed   By: Agustin Cree M.D.   On: 04/01/2023 08:50      Signature  -   Susa Raring M.D on 04/04/2023 at 8:21 AM   -  To page go to [email or web address]

## 2023-04-04 NOTE — Consult Note (Signed)
Cardiology Consultation   Patient ID: Peter [name] MRN: [id number]; DOB: 1951-06-20  Admit date: 04/01/2023 Date of Consult: 04/04/2023  PCP:  [name], MD   [facility] Providers Cardiologist:  Rollene Rotunda, MD       Patient Profile:   Peter [name] is a 72 y.o. male with a hx of ESRD on HD MWF, type 2 dM, anemia of CKD, HTN, HLD, Mild MR who is being seen 04/04/2023 for the evaluation of atrial fibrillation, possible endocarditis at the request of Dr. Thedore Mins  History of Present Illness:   Peter [name] is a 72 year old male with above medical history. Per chart review, patient was previously followed by Dr. Mayford Knife for OSA, mild MR. Patient underwent echocardiogram 10/24/15 that showed EF 60-65%, no regional wall motion abnormalities, grade 2 DD, mild MR. In 04/2018, patient had an episode of syncope. Echocardiogram 05/30/18 showed EF 60-65%, no regional wall motion abnormalities, moderate LVH, grade II DD, moderate, late systolic prolapse of the middle scallop of the posterior leaflet with moderate mitral regurgitation. Carotid ultrasounds on 05/30/18 showed 1-39% stenosis in bilateral ICAs.   Patient was referred to Dr. Antoine Poche in 01/2023 for evaluation of atrial fibrillation. Patient had undergone surgery for right arm AV graft and was found to have atrial fibrillation. He was unable to feel his atrial fibrillation, and denied syncope, near syncope, palpitations. He underwent echocardiogram that showed normal EF, normal mitral valve with no prolapse or regurgitation. He was started on eliquis 5 mg BID. He wore a cardiac monitor that was resulted 03/03/23 and showed continuous atrial fibrillation that was well rate controlled without AV nodal medications.   Patient presented to the ED on 7/2 complaining of fever, nausea, vomiting, and weakness that he been going on for the past 2 days. He did miss dialysis the day prior to presentation. He was febrile to 102.3 on admission.  Patient was found to be septic with 1 out of 2 blood cultures growing staph aureus. Also found to have aspiration pneumonia. Infectious disease was consulted, and patient was started on antibiotics. Patient had recently had a fall at home, and patient complained of right knee pain. Found to have an effusion, and ortho was consulted to rule out septic joint. Aspiration of knee joint showed many WBC. On 7/4, patient developed intermittent low blood pressure episodes and he was somnolent. PCCM was consulted, and it was thought that his encephalopathy was due to sepsis. Patient continued to complain of joint pain, and ID is concerned that patient's shoulder and knee are both infected. Patient also had some issues with word finding, and ID transitioned his antibiotics to nafcillin to cover potential CNS infection.   Echocardiogram on 04/03/23 showed EF 60-65%, no regional wall motion abnormalities, severe LVH, nrmal RV systolic function, mild MIR with possible posterior leaflet prolapse in addition to MAC. Recommended TEE for further evaluation. Cardiology consulted for TEE, afib.   On interview, patient mainly complains of joint pain. Does report that he feels better today than previously. No chest pain, palpitations, dizziness, near syncope or syncope   Past Medical History:  Diagnosis Date   Anemia in chronic kidney disease (CKD)    CKD (chronic kidney disease) stage 5, GFR less than 15 ml/min (HCC)    M-W-F   Diabetes mellitus without complication (HCC) 03/2012   Hx type II - no meds, diet controlled, lost weight   ED (erectile dysfunction)    Heart murmur    not significant  History of kidney stones    passed 1 , 1 was removed with kidney disease in 2010   Hypercholesteremia    Hypertension    Iron deficiency    Kidney stone 2010   MVP (mitral valve prolapse)    Posterior with MR by ECHO   OSA (obstructive sleep apnea)    does not use cpap, lost weight    Renal oncocytoma of left kidney 2010    w partial nephrectomy   Syncope     Past Surgical History:  Procedure Laterality Date   A/V FISTULAGRAM Left 02/10/2018   Procedure: A/V FISTULAGRAM;  Surgeon: Nada Libman, MD;  Location: [facility];  Service: Cardiovascular;  Laterality: Left;   APPLICATION OF WOUND VAC Right 03/15/2021   Procedure: APPLICATION OF WOUND VAC;  Surgeon: Leonie Douglas, MD;  Location: [facility] OR;  Service: Vascular;  Laterality: Right;   AV FISTULA PLACEMENT Left 06/14/2016   Procedure: ARTERIOVENOUS (AV) FISTULA CREATION LEFT ARM;  Surgeon: Chuck Hint, MD;  Location: [facility] OR;  Service: Vascular;  Laterality: Left;   AV FISTULA PLACEMENT Right 06/09/2018   Procedure: CREATION Right arm Brachiocephalic;  Surgeon: Maeola Harman, MD;  Location: [facility] OR;  Service: Vascular;  Laterality: Right;   AV FISTULA PLACEMENT Right 01/16/2023   Procedure: INSERTION OF RIGHT ARM ARTERIOVENOUS (AV) GORE-TEX GRAFT;  Surgeon: Leonie Douglas, MD;  Location: [facility] OR;  Service: Vascular;  Laterality: Right;   BASCILIC VEIN TRANSPOSITION Right 09/10/2018   Procedure: CEPHALIC VEIN TRANSPOSITION;  Surgeon: Maeola Harman, MD;  Location: [facility] OR;  Service: Vascular;  Laterality: Right;   BASCILIC VEIN TRANSPOSITION Left 01/25/2021   Procedure: LEFT ARMFIRST STAGE BASCILIC VEIN TRANSPOSITION;  Surgeon: Leonie Douglas, MD;  Location: [facility] OR;  Service: Vascular;  Laterality: Left;  PERIPHERAL NERVE BLOCK   BASCILIC VEIN TRANSPOSITION Left 03/15/2021   Procedure: LEFT UPPER ARM SECOND STAGE BASILIC TRANSPOSITION;  Surgeon: Leonie Douglas, MD;  Location: [facility] OR;  Service: Vascular;  Laterality: Left;   COLONOSCOPY     INSERTION OF DIALYSIS CATHETER N/A 06/09/2018   Procedure: INSERTION OF DIALYSIS CATHETER;  Surgeon: Maeola Harman, MD;  Location: [facility] OR;  Service: Vascular;  Laterality: N/A;   JOINT REPLACEMENT Left 2010   hip   PARTIAL NEPHRECTOMY Left 2010   PERIPHERAL VASCULAR  INTERVENTION Left 02/10/2018   Procedure: PERIPHERAL VASCULAR INTERVENTION;  Surgeon: Nada Libman, MD;  Location: [facility];  Service: Cardiovascular;  Laterality: Left;   TONSILLECTOMY       Home Medications:  Prior to Admission medications   Medication Sig Start Date End Date Taking? Authorizing Provider  acetaminophen (TYLENOL) 500 MG tablet Take 1,000 mg by mouth as needed for mild pain or moderate pain.   Yes [provider]  apixaban (ELIQUIS) 5 MG TABS tablet Take 1 tablet (5 mg total) by mouth 2 (two) times daily. 02/26/23  Yes Rollene Rotunda, MD  atorvastatin (LIPITOR) 10 MG tablet Take 10 mg by mouth at bedtime.   Yes [provider]  AURYXIA 1 GM 210 MG(Fe) tablet Take 420-630 mg by mouth See admin instructions. Take 630mg  by mouth three times daily with meals and take 420mg  by mouth with snacks.   Yes [provider]  VITAMIN D, CHOLECALCIFEROL, PO Take 1 tablet by mouth once a week. Patient had Dialysis on Monday, Wednesday, and Friday. 03/24/23 03/22/24 Yes [provider]    Inpatient Medications: Scheduled Meds:  (feeding  supplement) PROSource Plus  30 mL Oral BID BM   atorvastatin  10 mg Oral QHS   bupivacaine(PF)  10 mL Infiltration Once   Chlorhexidine Gluconate Cloth  6 each Topical Daily   Chlorhexidine Gluconate Cloth  6 each Topical Q0600   cinacalcet  180 mg Oral Q breakfast   ferric citrate  630 mg Oral TID WC   metoprolol tartrate  12.5 mg Oral BID   multivitamin  1 tablet Oral QHS   pantoprazole (PROTONIX) IV  40 mg Intravenous Q12H   Continuous Infusions:  heparin 1,200 Units/hr (04/03/23 1733)   nafcillin IV 2 g (04/04/23 0836)   PRN Meds: acetaminophen, diltiazem, lidocaine (PF), mouth rinse  Allergies:    Allergies  Allergen Reactions   Doxazosin Mesylate     ineffective for BP (10/2014)   Triamterene-Hctz     increased creatinine (see 09/2014 office note)    Social History:   Social History    Socioeconomic History   Marital status: Media planner    Spouse name: Not on file   Number of children: Not on file   Years of education: Not on file   Highest education level: Not on file  Occupational History   Not on file  Tobacco Use   Smoking status: Former    Types: Cigarettes    Quit date: 06/13/1986    Years since quitting: 36.8   Smokeless tobacco: Current    Types: Chew  Vaping Use   Vaping Use: Never used  Substance and Sexual Activity   Alcohol use: No   Drug use: No   Sexual activity: Yes  Other Topics Concern   Not on file  Social History Narrative   Lives alone.   Daughter.     Social Determinants of Health   Financial Resource Strain: Low Risk  (05/30/2018)   Overall Financial Resource Strain (CARDIA)    Difficulty of Paying Living Expenses: Not hard at all  Food Insecurity: No Food Insecurity (04/01/2023)   Hunger Vital Sign    Worried About Running Out of Food in the Last Year: Never true    Ran Out of Food in the Last Year: Never true  Transportation Needs: No Transportation Needs (04/01/2023)   PRAPARE - Administrator, Civil Service (Medical): No    Lack of Transportation (Non-Medical): No  Physical Activity: Inactive (05/30/2018)   Exercise Vital Sign    Days of Exercise per Week: 0 days    Minutes of Exercise per Session: 0 min  Stress: No Stress Concern Present (05/30/2018)   [employer] of Occupational Health - Occupational Stress Questionnaire    Feeling of Stress : Only a little  Social Connections: Socially Integrated (05/30/2018)   Social Connection and Isolation Panel [NHANES]    Frequency of Communication with Friends and Family: More than three times a week    Frequency of Social Gatherings with Friends and Family: More than three times a week    Attends Religious Services: More than 4 times per year    Active Member of [employer] or Organizations: Yes    Attends Banker Meetings: More than 4 times per year     Marital Status: Living with partner  Intimate Partner Violence: Not At Risk (04/01/2023)   Humiliation, Afraid, Rape, and Kick questionnaire    Fear of Current or Ex-Partner: No    Emotionally Abused: No    Physically Abused: No    Sexually Abused: No    Family History:  Family History  Problem Relation Age of Onset   Dementia Mother    Hypertension Father      ROS:  Please see the history of present illness.   All other ROS reviewed and negative.     Physical Exam/Data:   Vitals:   04/03/23 2222 04/03/23 2241 04/03/23 2304 04/04/23 0356  BP: (!) 115/49 (!) 118/46 (!) 107/46 115/65  Pulse: 60  60 67  Resp: 18  20 17   Temp:   98.2 F (36.8 C) 98 F (36.7 C)  TempSrc:   Oral Oral  SpO2: 91%  90% 90%  Weight:      Height:        Intake/Output Summary (Last 24 hours) at 04/04/2023 0909 Last data filed at 04/04/2023 0500 Gross per 24 hour  Intake 245.4 ml  Output --  Net 245.4 ml      04/02/2023    7:08 PM 04/02/2023    4:01 PM 04/01/2023    8:31 PM  Last 3 Weights  Weight (lbs) 216 lb 0.8 oz 218 lb 4.1 oz 215 lb 2.7 oz  Weight (kg) 98 kg 99 kg 97.6 kg     Body mass index is 34.87 kg/m.  General:  Well nourished, well developed, in no acute distress. Sitting upright in the bed  HEENT: normal Neck: no JVD Vascular: Radial pulses 2+ bilaterally Cardiac:  normal S1, S2; irregular rate and rhythm. Faint systolic murmur at apex  Lungs:  breathing unlabored. Anterior lung exam clear  Abd: soft, nontender  Ext: no edema in BLE.  Skin: warm and dry  Neuro:  CNs 2-12 intact, no focal abnormalities noted Psych:  Normal affect   EKG:  The EKG was personally reviewed and demonstrates: Atrial fibrillation with PVCs,  HR 82 BPM  Telemetry:  Telemetry was personally reviewed and demonstrates:  Atrial fibrillation, HR int he 70s-80s   Relevant CV Studies:  Echocardiogram 04/03/23 1. Left ventricular ejection fraction, by estimation, is 60 to 65%. The  left ventricle has  normal function. The left ventricle has no regional  wall motion abnormalities. There is severe left ventricular hypertrophy.  Left ventricular diastolic parameters   are indeterminate.   2. Right ventricular systolic function is normal. The right ventricular  size is normal.   3. Left atrial size was moderately dilated.   4. Mild appearing MR but some splay artifact cannot r/o some posterior  leaflet prolapse in addition to MAC with anteriorly directed MR Given  bacteremia and abnormal posterior mitral annulus suggest TEE to further  assess if clnically indicated. The  mitral valve is degenerative. Mild mitral valve regurgitation. No evidence  of mitral stenosis. Moderate mitral annular calcification.   5. The aortic valve is tricuspid. There is moderate calcification of the  aortic valve. There is moderate thickening of the aortic valve. Aortic  valve regurgitation is not visualized. Mild aortic valve stenosis.   6. The inferior vena cava is dilated in size with >50% respiratory  variability, suggesting right atrial pressure of 8 mmHg.   Laboratory Data:  High Sensitivity Troponin:  No results for input(s): "TROPONINIHS" in the last 720 hours.   Chemistry Recent Labs  Lab 04/02/23 0555 04/03/23 2115 04/04/23 0441  NA 131* 130* 127*  K 5.2* 4.4 4.1  CL 90* 87* 89*  CO2 21* 25 24  GLUCOSE 117* 143* 108*  BUN 58* 68* 77*  CREATININE 7.96* 7.58* 8.18*  CALCIUM 9.1 8.7* 8.2*  MG  --  2.2 2.3  GFRNONAA 7* 7* 6*  ANIONGAP 20* 18* 14    Recent Labs  Lab 04/01/23 1447 04/02/23 0555 04/03/23 1058 04/04/23 0441  PROT 6.6  --  6.5 5.9*  ALBUMIN 2.5* 2.3* 1.9* 1.7*  AST 172*  --  158* 230*  ALT 65*  --  76* 92*  ALKPHOS 39  --  45 48  BILITOT 1.0  --  1.3* 3.4*   Lipids No results for input(s): "CHOL", "TRIG", "HDL", "LABVLDL", "LDLCALC", "CHOLHDL" in the last 168 hours.  Hematology Recent Labs  Lab 04/02/23 0340 04/03/23 0140 04/04/23 0441  WBC 9.0 8.8 8.8  RBC  3.80* 3.62* 3.48*  HGB 12.9* 12.1* 11.7*  HCT 37.0* 35.2* 33.5*  MCV 97.4 97.2 96.3  MCH 33.9 33.4 33.6  MCHC 34.9 34.4 34.9  RDW 14.4 14.3 14.2  PLT 77* 87* 98*   Thyroid No results for input(s): "TSH", "FREET4" in the last 168 hours.  BNP Recent Labs  Lab 04/03/23 0140 04/04/23 0441  BNP 136.6* 112.2*    DDimer No results for input(s): "DDIMER" in the last 168 hours.   Radiology/Studies:  ECHOCARDIOGRAM COMPLETE  Result Date: 04/03/2023    ECHOCARDIOGRAM REPORT   Patient Name:   SHAHAN DAVID Wyka Date of Exam: 04/03/2023 Medical Rec #:  [id number]         Height:       66.0 in Accession #:    [id number]        Weight:       216.0 lb Date of Birth:  Jul 15, 1951         BSA:          2.067 m Patient Age:    72 years          BP:           106/33 mmHg Patient Gender: M                 HR:           110 bpm. Exam Location:  Inpatient Procedure: 2D Echo, Cardiac Doppler and Color Doppler Indications:    Bacteremia R78.81  History:        Patient has prior history of Echocardiogram examinations, most                 recent 05/30/2018. CHF, Arrythmias:Atrial Fibrillation and                 Bradycardia, Signs/Symptoms:Shortness of Breath and Syncope;                 Risk Factors:Hypertension, Sleep Apnea and Diabetes. ESRD,                 Arteriovenous fistula.  Sonographer:    Lucendia Herrlich Referring Phys: [id number] CORNELIUS N VAN DAM IMPRESSIONS  1. Left ventricular ejection fraction, by estimation, is 60 to 65%. The left ventricle has normal function. The left ventricle has no regional wall motion abnormalities. There is severe left ventricular hypertrophy. Left ventricular diastolic parameters  are indeterminate.  2. Right ventricular systolic function is normal. The right ventricular size is normal.  3. Left atrial size was moderately dilated.  4. Mild appearing MR but some splay artifact cannot r/o some posterior leaflet prolapse in addition to MAC with anteriorly directed MR Given bacteremia and  abnormal posterior mitral annulus suggest TEE to further assess if clnically indicated. The mitral valve is degenerative. Mild mitral valve regurgitation. No evidence of mitral stenosis. Moderate mitral annular calcification.  5. The aortic valve is tricuspid. There is moderate calcification of the aortic valve. There is moderate thickening of the aortic valve. Aortic valve regurgitation is not visualized. Mild aortic valve stenosis.  6. The inferior vena cava is dilated in size with >50% respiratory variability, suggesting right atrial pressure of 8 mmHg. FINDINGS  Left Ventricle: Left ventricular ejection fraction, by estimation, is 60 to 65%. The left ventricle has normal function. The left ventricle has no regional wall motion abnormalities. The left ventricular internal cavity size was normal in size. There is  severe left ventricular hypertrophy. Left ventricular diastolic parameters are indeterminate. Right Ventricle: The right ventricular size is normal. No increase in right ventricular wall thickness. Right ventricular systolic function is normal. Left Atrium: Left atrial size was moderately dilated. Right Atrium: Right atrial size was normal in size. Pericardium: There is no evidence of pericardial effusion. Mitral Valve: Mild appearing MR but some splay artifact cannot r/o some posterior leaflet prolapse in addition to MAC with anteriorly directed MR Given bacteremia and abnormal posterior mitral annulus suggest TEE to further assess if clnically indicated.  The mitral valve is degenerative in appearance. There is moderate thickening of the mitral valve leaflet(s). There is moderate calcification of the mitral valve leaflet(s). Moderate mitral annular calcification. Mild mitral valve regurgitation. No evidence of mitral valve stenosis. Tricuspid Valve: The tricuspid valve is normal in structure. Tricuspid valve regurgitation is mild . No evidence of tricuspid stenosis. Aortic Valve: The aortic valve is  tricuspid. There is moderate calcification of the aortic valve. There is moderate thickening of the aortic valve. Aortic valve regurgitation is not visualized. Mild aortic stenosis is present. Aortic valve mean gradient measures 9.0 mmHg. Aortic valve peak gradient measures 16.6 mmHg. Aortic valve area, by VTI measures 1.54 cm. Pulmonic Valve: The pulmonic valve was normal in structure. Pulmonic valve regurgitation is not visualized. No evidence of pulmonic stenosis. Aorta: The aortic root is normal in size and structure. Venous: The inferior vena cava is dilated in size with greater than 50% respiratory variability, suggesting right atrial pressure of 8 mmHg. IAS/Shunts: No atrial level shunt detected by color flow Doppler.  LEFT VENTRICLE PLAX 2D LVIDd:         4.00 cm   Diastology LVIDs:         2.60 cm   LV e' lateral:   13.40 cm/s LV PW:         1.50 cm   LV E/e' lateral: 8.8 LV IVS:        1.80 cm LVOT diam:     2.30 cm LV SV:         51 LV SV Index:   24 LVOT Area:     4.15 cm  RIGHT VENTRICLE             IVC RV S prime:     15.20 cm/s  IVC diam: 2.10 cm TAPSE (M-mode): 1.9 cm LEFT ATRIUM            Index        RIGHT ATRIUM           Index LA diam:      4.50 cm  2.18 cm/m   RA Area:     19.40 cm LA Vol (A2C): 104.0 ml 50.33 ml/m  RA Volume:   46.80 ml  22.65 ml/m LA Vol (A4C): 128.0 ml 61.94 ml/m  AORTIC VALVE AV Area (Vmax):    1.77 cm AV Area (Vmean):   1.67  cm AV Area (VTI):     1.54 cm AV Vmax:           204.00 cm/s AV Vmean:          137.000 cm/s AV VTI:            0.329 m AV Peak Grad:      16.6 mmHg AV Mean Grad:      9.0 mmHg LVOT Vmax:         87.00 cm/s LVOT Vmean:        55.067 cm/s LVOT VTI:          0.122 m LVOT/AV VTI ratio: 0.37  AORTA Ao Root diam: 3.70 cm Ao Asc diam:  3.90 cm MITRAL VALVE                TRICUSPID VALVE MV Area (PHT): 4.15 cm     TR Peak grad:   30.7 mmHg MV Decel Time: 183 msec     TR Vmax:        277.00 cm/s MR Peak grad: 31.4 mmHg MR Vmax:      280.00 cm/s    SHUNTS MV E velocity: 118.00 cm/s  Systemic VTI:  0.12 m MV A velocity: 45.30 cm/s   Systemic Diam: 2.30 cm MV E/A ratio:  2.60 Charlton Haws MD Electronically signed by Charlton Haws MD Signature Date/Time: 04/03/2023/5:07:21 PM    Final    MR SHOULDER LEFT WO CONTRAST  Result Date: 04/03/2023 CLINICAL DATA:  Left shoulder pain. EXAM: MRI OF THE LEFT SHOULDER WITHOUT CONTRAST TECHNIQUE: Multiplanar, multisequence MR imaging of the shoulder was performed. No intravenous contrast was administered. COMPARISON:  None Available. FINDINGS: Rotator cuff: Complete tear of the supraspinatus and infraspinatus tendons with 5.7 cm of retraction. Teres minor tendon is intact. Moderate tendinosis of the subscapularis tendon with a small partial-thickness tear. Muscles: Severe atrophy of the supraspinatus, infraspinatus and subscapularis muscles. Biceps Long Head: Complete tear of the proximal long head of the biceps tendon. Acromioclavicular Joint: Moderate arthropathy of the acromioclavicular joint. Small amount of subacromial/subdeltoid bursal fluid. Glenohumeral Joint: Large joint effusion. Partial-thickness cartilage loss of the glenohumeral joint with areas of high-grade partial-thickness cartilage loss. Labrum: Generalized labral degeneration. Bones: No fracture or dislocation. No aggressive osseous lesion. Other: No fluid collection or hematoma. Muscle edema in the short of the biceps muscle and coracobrachialis muscle concerning for muscle strain. IMPRESSION: 1. Complete tear of the supraspinatus and infraspinatus tendons with 5.7 cm of retraction. 2. Moderate tendinosis of the subscapularis tendon with a small partial-thickness tear. 3. Complete tear of the proximal long head of the biceps tendon. 4. Moderate osteoarthritis of the glenohumeral joint. Large glenohumeral joint effusion. Electronically Signed   By: Elige Ko M.D.   On: 04/03/2023 10:45   MR CERVICAL SPINE WO CONTRAST  Result Date: 04/03/2023 CLINICAL  DATA:  In stage renal disease, cervical bone lesion. EXAM: MRI CERVICAL SPINE WITHOUT CONTRAST TECHNIQUE: Multiplanar, multisequence MR imaging of the cervical spine was performed. No intravenous contrast was administered. COMPARISON:  CT cervical spine 04/01/2023 FINDINGS: Alignment: Physiologic. Vertebrae: No acute fracture, evidence of discitis, or aggressive bone lesion. Cord: Normal signal and morphology. Posterior Fossa, vertebral arteries, paraspinal tissues: Posterior fossa demonstrates no focal abnormality. Vertebral artery flow voids are maintained. Paraspinal soft tissues are unremarkable. Disc levels: Discs: Degenerative disease with disc height loss at C4-5 and C5-6. Degenerative disease with disc height loss at C7-T1, T1-2 and T2-3. C2-3: Small central disc protrusion. Moderate right and mild  left facet arthropathy. Mild bilateral foraminal stenosis. No spinal stenosis. C3-4: Broad-based disc bulge. Broad-based disc bulge with a small central disc protrusion. Moderate bilateral facet arthropathy. No severe bilateral foraminal stenosis. Severe spinal stenosis. C4-5: Broad-based disc bulge with a broad central disc protrusion. Severe spinal stenosis. Severe bilateral foraminal stenosis. C5-6: Mild broad-based disc bulge. Moderate right facet arthropathy. Severe bilateral foraminal stenosis. Mild spinal stenosis. C6-7: Mild broad-based disc bulge. Mild bilateral facet arthropathy. Severe right foraminal stenosis. Moderate left foraminal stenosis. No spinal stenosis. C7-T1: Minimal broad-based disc bulge. No foraminal or central canal stenosis. T1-2: Broad-based disc bulge. Moderate bilateral foraminal stenosis. No spinal stenosis. Bilateral facet arthropathy. T2-3: Broad-based disc bulge. Bilateral facet arthropathy. Mild bilateral foraminal stenosis. IMPRESSION: 1. Diffuse cervical spine spondylosis as described above. 2. No acute osseous injury of the cervical spine. 3. No aggressive osseous lesion of  the cervical spine. 4. At C3-4 there is a broad-based disc bulge with a small central disc protrusion. Moderate bilateral facet arthropathy. No severe bilateral foraminal stenosis. Severe spinal stenosis. 5. At C4-5 there is a broad-based disc bulge with a broad central disc protrusion. Severe spinal stenosis. Severe bilateral foraminal stenosis. 6. At C5-6 there is a mild broad-based disc bulge. Moderate right facet arthropathy. Severe bilateral foraminal stenosis. Mild spinal stenosis. 7. At C6-7 there is a mild broad-based disc bulge. Mild bilateral facet arthropathy. Severe right foraminal stenosis. Moderate left foraminal stenosis. Electronically Signed   By: Elige Ko M.D.   On: 04/03/2023 10:40   VAS [place] LOWER EXTREMITY VENOUS (DVT)  Result Date: 04/02/2023  Lower Venous DVT Study Patient Name:  ADARRYLL LARRIMORE  Date of Exam:   04/02/2023 Medical Rec #: [id number]          Accession #:    [id number] Date of Birth: 01-04-1951          Patient Gender: M Patient Age:   28 years Exam Location:  [facility] Procedure:      VAS [place] LOWER EXTREMITY VENOUS (DVT) Referring Phys: Kathlen Mody --------------------------------------------------------------------------------  Indications: Swelling.  Limitations: Body habitus and poor ultrasound/tissue interface. Comparison Study: Previous 01/09/18 negative. Performing Technologist: McKayla Maag RVT, [place]  Examination Guidelines: A complete evaluation includes B-mode imaging, spectral Doppler, color Doppler, and power Doppler as needed of all accessible portions of each vessel. Bilateral testing is considered an integral part of a complete examination. Limited examinations for reoccurring indications may be performed as noted. The reflux portion of the exam is performed with the patient in reverse Trendelenburg.  +-----+---------------+---------+-----------+----------+--------------+ RIGHTCompressibilityPhasicitySpontaneityPropertiesThrombus Aging  +-----+---------------+---------+-----------+----------+--------------+ CFV  Full           Yes      Yes                                 +-----+---------------+---------+-----------+----------+--------------+ SFJ  Full                                                        +-----+---------------+---------+-----------+----------+--------------+   +---------+---------------+---------+-----------+----------+-------------------+ LEFT     CompressibilityPhasicitySpontaneityPropertiesThrombus Aging      +---------+---------------+---------+-----------+----------+-------------------+ CFV      Full           Yes      Yes                                      +---------+---------------+---------+-----------+----------+-------------------+  SFJ      Full                                                             +---------+---------------+---------+-----------+----------+-------------------+ FV Prox  Full                                                             +---------+---------------+---------+-----------+----------+-------------------+ FV Mid   Full                                                             +---------+---------------+---------+-----------+----------+-------------------+ FV DistalFull                                                             +---------+---------------+---------+-----------+----------+-------------------+ PFV      Full                                                             +---------+---------------+---------+-----------+----------+-------------------+ POP      Full           Yes      Yes                                      +---------+---------------+---------+-----------+----------+-------------------+ PTV      Full                                                             +---------+---------------+---------+-----------+----------+-------------------+ PERO                                                   Not well visualized +---------+---------------+---------+-----------+----------+-------------------+    Summary: RIGHT: - No evidence of common femoral vein obstruction.  LEFT: - There is no evidence of deep vein thrombosis in the lower extremity. However, portions of this examination were limited- see technologist comments above.  - No cystic structure found in the popliteal fossa.  *See table(s) above for measurements and observations. Electronically signed by Coral Else MD on 04/02/2023 at 9:15:20 PM.    Final    CT ABDOMEN PELVIS W CONTRAST  Result Date: 04/02/2023 CLINICAL  DATA:  Abdominal pain, acute, nonlocalized. End stage renal disease. Nausea and vomiting. Sepsis. EXAM: CT ABDOMEN AND PELVIS WITH CONTRAST TECHNIQUE: Multidetector CT imaging of the abdomen and pelvis was performed using the standard protocol following bolus administration of intravenous contrast. RADIATION DOSE REDUCTION: This exam was performed according to the departmental dose-optimization program which includes automated exposure control, adjustment of the mA and/or kV according to patient size and/or use of iterative reconstruction technique. CONTRAST:  75mL OMNIPAQUE IOHEXOL 350 MG/ML SOLN COMPARISON:  04/05/2009 FINDINGS: Lower chest: Mild dependent atelectasis at the lung bases. Tiny amount of pleural fluid. Non dependent lung as well aerated. There is cardiomegaly and coronary artery calcification. Hepatobiliary: No liver parenchymal abnormality. No calcified gallstones. No ductal dilatation. Pancreas: Normal Spleen: Normal Adrenals/Urinary Tract: Adrenal glands are normal. Atrophic kidneys, more so on the left than the right. No hydroureteronephrosis. No stone or mass. Bladder is not well seen because of artifact from a left hip replacement. No bladder distension. Stomach/Bowel: Stomach and small intestine are normal. Normal appendix. No abnormal colon finding. Diverticulosis without evidence of diverticulitis.  Vascular/Lymphatic: Aorta and IVC are normal.  No adenopathy. Reproductive: Normal Other: No free fluid or air. Musculoskeletal: Ordinary mild degenerative change affects the spine. IMPRESSION: 1. No acute finding by CT. 2. Atrophic kidneys, more so on the left than the right. 3. Diverticulosis without evidence of diverticulitis. 4. Cardiomegaly and coronary artery calcification. 5. Mild dependent atelectasis at the lung bases. Tiny amount of pleural fluid. Electronically Signed   By: Paulina Fusi M.D.   On: 04/02/2023 13:01   DG Abd Portable 1V  Result Date: 04/02/2023 CLINICAL DATA:  Nausea and vomiting EXAM: PORTABLE ABDOMEN - 1 VIEW COMPARISON:  Abdominal CT 04/05/2009 FINDINGS: Normal bowel gas pattern. Stool at the rectum without generalized abnormal stool retention. No concerning mass effect or calcification. Cardiomegaly with clear lung bases. Generalized spondylosis. Unremarkable left hip replacement. IMPRESSION: Normal bowel gas pattern. Electronically Signed   By: Tiburcio Pea M.D.   On: 04/02/2023 06:59   DG Chest Port 1 View  Result Date: 04/02/2023 CLINICAL DATA:  Shortness of breath EXAM: PORTABLE CHEST 1 VIEW COMPARISON:  04/01/2023 FINDINGS: Chronic cardiomegaly. Stable mediastinal contours. Low volume but clear lungs with no effusion or pneumothorax. Artifact from EKG leads. IMPRESSION: Stable low volume chest with cardiomegaly. Electronically Signed   By: Tiburcio Pea M.D.   On: 04/02/2023 06:58   DG Shoulder Left  Result Date: 04/01/2023 CLINICAL DATA:  Pain after fall EXAM: LEFT SHOULDER - 2 VIEW COMPARISON:  None Available. FINDINGS: Osteopenia. Osteophyte formation of the glenohumeral joint and AC joint with some joint space loss. There is also elevation of the humeral head relative to the acromion. Please correlate for a full-thickness rotator cuff tear. No acute fracture or dislocation. Surgical clips in the left axillary region. IMPRESSION: Moderate degenerative changes.  High-riding humeral head. Please correlate for a full-thickness rotator cuff tear Electronically Signed   By: Karen Kays M.D.   On: 04/01/2023 11:04   DG Knee Complete 4 Views Right  Result Date: 04/01/2023 CLINICAL DATA:  Pain after fall EXAM: RIGHT KNEE - COMPLETE 4 VIEW COMPARISON:  None Available. FINDINGS: Moderate joint space loss of the medial compartment. Small osteophytes seen of all 3 compartments greatest of the patellofemoral joint. There is a moderate joint effusion. Hyperostosis. Osteopenia. No fracture or dislocation. IMPRESSION: Tricompartment degenerative changes identified, overall moderate greatest involving the medial compartment and patellofemoral joint. Moderate joint effusion of uncertain etiology. This has  a broad differential. This could be related to the degenerative changes. However please correlate for history of trauma or infection. Electronically Signed   By: Karen Kays M.D.   On: 04/01/2023 11:03   DG Knee Complete 4 Views Left  Result Date: 04/01/2023 CLINICAL DATA:  Pain after fall.  Weakness for 2 days EXAM: LEFT KNEE - COMPLETE 4 VIEW COMPARISON:  None Available. FINDINGS: Osteopenia. Slight joint space loss of the medial compartment. Small osteophytes of all 3 compartments. No joint effusion on lateral view. No fracture or dislocation. Hyperostosis of the patella. Vascular calcifications. IMPRESSION: Mild degenerative changes.  No joint effusion. Electronically Signed   By: Karen Kays M.D.   On: 04/01/2023 11:00   CT Head Wo Contrast  Result Date: 04/01/2023 CLINICAL DATA:  Provided history: Polytrauma, blunt. EXAM: CT HEAD WITHOUT CONTRAST CT CERVICAL SPINE WITHOUT CONTRAST TECHNIQUE: Multidetector CT imaging of the head and cervical spine was performed following the standard protocol without intravenous contrast. Multiplanar CT image reconstructions of the cervical spine were also generated. RADIATION DOSE REDUCTION: This exam was performed according to the  departmental dose-optimization program which includes automated exposure control, adjustment of the mA and/or kV according to patient size and/or use of iterative reconstruction technique. COMPARISON:  None. FINDINGS: CT HEAD FINDINGS Brain: Mild generalized parenchymal atrophy. There is no acute intracranial hemorrhage. No demarcated cortical infarct. No extra-axial fluid collection. No evidence of an intracranial mass. No midline shift. Vascular: No hyperdense vessel.  Atherosclerotic calcifications. Skull: No calvarial fracture or aggressive osseous lesion. Sinuses/Orbits: No mass or acute finding within the imaged orbits. Mild-to-moderate mucosal thickening, and small volume frothy secretions, within the right maxillary sinus. Small mucous retention cyst within the left maxillary sinus. CT CERVICAL SPINE FINDINGS Alignment: No significant spondylolisthesis. Skull base and vertebrae: The basion-dental and atlanto-dental intervals are maintained.No evidence of acute fracture to the cervical spine. Soft tissues and spinal canal: No prevertebral fluid or swelling. No visible canal hematoma. Disc levels: Cervical spondylosis with multilevel disc space narrowing, disc bulges/central disc protrusions, endplate spurring, uncovertebral hypertrophy and facet arthrosis. Disc space narrowing is greatest at C5-C6 (moderate/advanced), C7-T1 (moderate/advanced), T1-T2 (advanced) and T2-T3 (advanced). 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 Most notably at C2-C3 and C3-C4, central disc protrusions contribute to at least moderate spinal canal stenosis. Multilevel bony neural foraminal narrowing. Upper chest: No consolidation within the imaged lung apices. No visible pneumothorax. IMPRESSION: CT head: 1.  No evidence of an acute intracranial abnormality. 2. Mild generalized parenchymal atrophy. 3. Paranasal sinus disease as described. CT cervical spine: 1. No evidence of  an acute cervical spine fracture. 2. Cervical spondylosis as outlined. Notably at C2-C3 and C3-C4, central disc protrusions contribute to at least moderate spinal canal stenosis. Multilevel bony neural from narrowing. Electronically Signed   By: Jackey Loge D.O.   On: 04/01/2023 10:32   CT Cervical Spine Wo Contrast  Result Date: 04/01/2023 CLINICAL DATA:  Provided history: Polytrauma, blunt. EXAM: CT HEAD WITHOUT CONTRAST CT CERVICAL SPINE WITHOUT CONTRAST TECHNIQUE: Multidetector CT imaging of the head and cervical spine was performed following the standard protocol without intravenous contrast. Multiplanar CT image reconstructions of the cervical spine were also generated. RADIATION DOSE REDUCTION: This exam was performed according to the departmental dose-optimization program which includes automated exposure control, adjustment of the mA and/or kV according to patient size and/or use of iterative reconstruction technique. COMPARISON:  None. FINDINGS: CT HEAD FINDINGS Brain: Mild generalized parenchymal atrophy. There  is no acute intracranial hemorrhage. No demarcated cortical infarct. No extra-axial fluid collection. No evidence of an intracranial mass. No midline shift. Vascular: No hyperdense vessel.  Atherosclerotic calcifications. Skull: No calvarial fracture or aggressive osseous lesion. Sinuses/Orbits: No mass or acute finding within the imaged orbits. Mild-to-moderate mucosal thickening, and small volume frothy secretions, within the right maxillary sinus. Small mucous retention cyst within the left maxillary sinus. CT CERVICAL SPINE FINDINGS Alignment: No significant spondylolisthesis. Skull base and vertebrae: The basion-dental and atlanto-dental intervals are maintained.No evidence of acute fracture to the cervical spine. Soft tissues and spinal canal: No prevertebral fluid or swelling. No visible canal hematoma. Disc levels: Cervical spondylosis with multilevel disc space narrowing, disc  bulges/central disc protrusions, endplate spurring, uncovertebral hypertrophy and facet arthrosis. Disc space narrowing is greatest at C5-C6 (moderate/advanced), C7-T1 (moderate/advanced), T1-T2 (advanced) and T2-T3 (advanced). 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 Most notably at C2-C3 and C3-C4, central disc protrusions contribute to at least moderate spinal canal stenosis. Multilevel bony neural foraminal narrowing. Upper chest: No consolidation within the imaged lung apices. No visible pneumothorax. IMPRESSION: CT head: 1.  No evidence of an acute intracranial abnormality. 2. Mild generalized parenchymal atrophy. 3. Paranasal sinus disease as described. CT cervical spine: 1. No evidence of an acute cervical spine fracture. 2. Cervical spondylosis as outlined. Notably at C2-C3 and C3-C4, central disc protrusions contribute to at least moderate spinal canal stenosis. Multilevel bony neural from narrowing. Electronically Signed   By: Jackey Loge D.O.   On: 04/01/2023 10:32   DG Chest Portable 1 View  Result Date: 04/01/2023 CLINICAL DATA:  Shortness of breath EXAM: PORTABLE CHEST 1 VIEW COMPARISON:  Chest radiograph dated 06/09/2018 FINDINGS: The medial apices are obscured due to overlying mandible. Low lung volumes with bronchovascular crowding. Medial right lower lung patchy opacities. No pleural effusion or pneumothorax. Similar enlarged cardiomediastinal silhouette. No acute osseous abnormality. IMPRESSION: 1. Medial right lower lung patchy opacities, which may represent atelectasis, aspiration, or pneumonia. 2. Similar cardiomegaly. Electronically Signed   By: Agustin Cree M.D.   On: 04/01/2023 08:50     Assessment and Plan:   Persistent Atrial Fibrillation  - Patient formally diagnosed with afib in 12/2022 and was started on eliquis 5 mg BID. He has been rate controlled off AV nodal medications and he was asymptomatic, so never attempted  cardioversion, ablation, or antiarrhythmic therapies. Three day zio in 01/2023 for 100% afib  - Now, patient is admitted with sepsis/bacteremia - His HR has been well controlled in the 70s-80s. Remains asymptomatic  - Has metoprolol tartrate 12.5 mg BID ordered with hold parameters for low BP  - Continue IV heparin until all procedures complete   Bacteremia  - Patient has staph aureus bacteremia. ID suspects that his knee and shoulder are infected. Ortho following  - On abx per primary, ID  - Echocardiogram this admission showed mild appearing MR - could not rule out posterior leaflet prolapse - ID requesting TEE - TEE has been scheduled for Tuesday, 7/9. I did not consent patient today due to encephalopathy. Will consent Monday if he is able, otherwise will need to contact family   Otherwise per primary  - Aspiration pneumonia  - ESRD  - GERD  - Fall - Toxic encephalopathy  - Mild asymptomatic transaminitis   Risk Assessment/Risk Scores:        CHA2DS2-VASc Score = 2   This indicates a 2.2% annual risk of stroke. The patient's score  is based upon: CHF History: 0 HTN History: 1 Diabetes History: 0 Stroke History: 0 Vascular Disease History: 0 Age Score: 1 Gender Score: 0   For questions or updates, please contact [facility] Please consult [email or web address] for contact info under    Signed, Jonita Albee, PA-C  04/04/2023 9:09 AM

## 2023-04-04 NOTE — Progress Notes (Signed)
[facility] Progress Note holiday coverage   Patient ID: Peter [name] MRN: [id number] DOB/AGE: 03-20-1951 72 y.o.  Subjective:  Seen in HD Doing fair Tired appearing   Spoke with IR and they are planning on L shoulder aspiration this afternoon  Will not aspirate R wrist without confirmation of effusion    On nafcillin. Remains afebrile with normal White count   ROS As above  Objective:   VITALS:   Vitals:   04/04/23 1035 04/04/23 1100 04/04/23 1130 04/04/23 1200  BP: 103/63 103/63 (!) 117/56 117/60  Pulse:  74 86 78  Resp:  19 18 15   Temp:      TempSrc:      SpO2:  92% 91% 91%  Weight:      Height:        Estimated body mass index is 35.16 kg/m as calculated from the following:   Height as of this encounter: 5\' 6"  (1.676 m).   Weight as of this encounter: 98.8 kg.   Intake/Output      07/04 0701 07/05 0700 07/05 0701 07/06 0700   I.V. (mL/kg) 137.4 (1.4)    IV Piggyback 108    Total Intake(mL/kg) 245.4 (2.5)    Urine (mL/kg/hr)     Other     Total Output     Net +245.4         Stool Occurrence 1 x      LABS  Results for orders placed or performed during the hospital encounter of 04/01/23 (from the past 24 hour(s))  Culture, blood (Routine X 2) w Reflex to ID Panel     Status: None (Preliminary result)   Collection Time: 04/03/23  2:43 PM   Specimen: BLOOD LEFT HAND  Result Value Ref Range   Specimen Description BLOOD LEFT HAND    Special Requests      BOTTLES DRAWN AEROBIC AND ANAEROBIC Blood Culture adequate volume   Culture      NO GROWTH < 24 HOURS Performed at [facility], [address] [place], [place] [address]    Report Status PENDING   Culture, blood (Routine X 2) w Reflex to ID Panel     Status: None (Preliminary result)   Collection Time: 04/03/23  2:47 PM   Specimen: BLOOD LEFT HAND  Result Value Ref Range   Specimen Description  BLOOD LEFT HAND    Special Requests      BOTTLES DRAWN AEROBIC AND ANAEROBIC Blood Culture adequate volume   Culture      NO GROWTH < 24 HOURS Performed at [facility], [address] [place], [place] [address]    Report Status PENDING   Basic metabolic panel     Status: Abnormal   Collection Time: 04/03/23  9:15 PM  Result Value Ref Range   Sodium 130 (L) 135 - 145 mmol/L   Potassium 4.4 3.5 - 5.1 mmol/L   Chloride 87 (L) 98 - 111 mmol/L   CO2 25 22 - 32 mmol/L   Glucose, Bld 143 (H) 70 - 99 mg/dL   BUN 68 (H) 8 - 23 mg/dL   Creatinine, Ser 2.95 (H) 0.61 - 1.24 mg/dL   Calcium 8.7 (L) 8.9 - 10.3 mg/dL   GFR, Estimated 7 (L) >60 mL/min   Anion gap 18 (  H) 5 - 15  Magnesium     Status: None   Collection Time: 04/03/23  9:15 PM  Result Value Ref Range   Magnesium 2.2 1.7 - 2.4 mg/dL  CBC with Differential/Platelet     Status: Abnormal   Collection Time: 04/04/23  4:41 AM  Result Value Ref Range   WBC 8.8 4.0 - 10.5 K/uL   RBC 3.48 (L) 4.22 - 5.81 MIL/uL   Hemoglobin 11.7 (L) 13.0 - 17.0 g/dL   HCT 96.0 (L) 45.4 - 09.8 %   MCV 96.3 80.0 - 100.0 fL   MCH 33.6 26.0 - 34.0 pg   MCHC 34.9 30.0 - 36.0 g/dL   RDW 11.9 14.7 - 82.9 %   Platelets 98 (L) 150 - 400 K/uL   nRBC 0.0 0.0 - 0.2 %   Neutrophils Relative % 76 %   Neutro Abs 6.7 1.7 - 7.7 K/uL   Lymphocytes Relative 11 %   Lymphs Abs 1.0 0.7 - 4.0 K/uL   Monocytes Relative 8 %   Monocytes Absolute 0.7 0.1 - 1.0 K/uL   Eosinophils Relative 2 %   Eosinophils Absolute 0.1 0.0 - 0.5 K/uL   Basophils Relative 0 %   Basophils Absolute 0.0 0.0 - 0.1 K/uL   Immature Granulocytes 3 %   Abs Immature Granulocytes 0.30 (H) 0.00 - 0.07 K/uL  Brain natriuretic peptide     Status: Abnormal   Collection Time: 04/04/23  4:41 AM  Result Value Ref Range   B Natriuretic Peptide 112.2 (H) 0.0 - 100.0 pg/mL  C-reactive protein     Status: Abnormal   Collection Time: 04/04/23  4:41 AM  Result Value Ref Range   CRP 40.7 (H) <1.0  mg/dL  Procalcitonin     Status: None   Collection Time: 04/04/23  4:41 AM  Result Value Ref Range   Procalcitonin 32.52 ng/mL  Heparin level (unfractionated)     Status: Abnormal   Collection Time: 04/04/23  4:41 AM  Result Value Ref Range   Heparin Unfractionated >1.10 (H) 0.30 - 0.70 IU/mL  APTT     Status: Abnormal   Collection Time: 04/04/23  4:41 AM  Result Value Ref Range   aPTT 78 (H) 24 - 36 seconds  Magnesium     Status: None   Collection Time: 04/04/23  4:41 AM  Result Value Ref Range   Magnesium 2.3 1.7 - 2.4 mg/dL  Comprehensive metabolic panel     Status: Abnormal   Collection Time: 04/04/23  4:41 AM  Result Value Ref Range   Sodium 127 (L) 135 - 145 mmol/L   Potassium 4.1 3.5 - 5.1 mmol/L   Chloride 89 (L) 98 - 111 mmol/L   CO2 24 22 - 32 mmol/L   Glucose, Bld 108 (H) 70 - 99 mg/dL   BUN 77 (H) 8 - 23 mg/dL   Creatinine, Ser 5.62 (H) 0.61 - 1.24 mg/dL   Calcium 8.2 (L) 8.9 - 10.3 mg/dL   Total Protein 5.9 (L) 6.5 - 8.1 g/dL   Albumin 1.7 (L) 3.5 - 5.0 g/dL   AST 130 (H) 15 - 41 U/L   ALT 92 (H) 0 - 44 U/L   Alkaline Phosphatase 48 38 - 126 U/L   Total Bilirubin 3.4 (H) 0.3 - 1.2 mg/dL   GFR, Estimated 6 (L) >60 mL/min   Anion gap 14 5 - 15     PHYSICAL EXAM:   Gen: awake but tired appearing. Currently in HD unit  Lungs: unlabored Ext:       Left Upper extremity              still No significant pain with passive ROM of L shoulder                         Tolerates near full range             Unable to move shoulder actively              Motor and sensory functions intact             No erythema around shoulder                   Right lower extremity              R knee with persistent mild effusion              Bandaid in place laterally from previous aspiration                 No erythema             No real tenderness with firm palpation around knee              still Tolerates about a 45 degree arc in motion. He does lack full extension which  is suspect is baseline from his severe arthritis              Ext warm         Right upper extremity   HD hooked up to R arm   Wrist is tender over his ulnar styloid  No appreciable increased temp to wrist    No tenderness with palpation over distal radius or carpals   Assessment/Plan:      Anti-infectives (From admission, onward)    Start     Dose/Rate Route Frequency Ordered Stop   04/03/23 1200  nafcillin 2 g in sodium chloride 0.9 % 100 mL IVPB        2 g 216 mL/hr over 30 Minutes Intravenous Every 4 hours 04/03/23 1112     04/03/23 1145  nafcillin injection 2 g  Status:  Discontinued        2 g Intravenous Every 4 hours 04/03/23 1050 04/03/23 1112   04/02/23 1245  Ampicillin-Sulbactam (UNASYN) 3 g in sodium chloride 0.9 % 100 mL IVPB  Status:  Discontinued        3 g 200 mL/hr over 30 Minutes Intravenous Daily 04/02/23 1152 04/03/23 1050   04/01/23 2200  ceFAZolin (ANCEF) IVPB 1 g/50 mL premix  Status:  Discontinued        1 g 100 mL/hr over 30 Minutes Intravenous Every 24 hours 04/01/23 2039 04/02/23 1146   04/01/23 0830  cefTRIAXone (ROCEPHIN) 2 g in sodium chloride 0.9 % 100 mL IVPB  Status:  Discontinued        2 g 200 mL/hr over 30 Minutes Intravenous Every 24 hours 04/01/23 0823 04/01/23 2039   04/01/23 0830  azithromycin (ZITHROMAX) 500 mg in sodium chloride 0.9 % 250 mL IVPB  Status:  Discontinued        500 mg 250 mL/hr over 60 Minutes Intravenous Every 24 hours 04/01/23 0823 04/01/23 2039     .  72 y/o male with numerous medical comorbidities including ESRD on HD admitted for sepsis with R knee effusion and L shoulder effusion with acute on  chronic changes    -R knee effusion, L shoulder effusion, R wrist pain, R shoulder effusion   IR plans L shoulder aspiration later today   Check R wrist films to eval for fracture of ulnar styloid. Pt does not remember how he landed when he fell. If negative then CT wrist to eval for effusion (unable to position yesterday  for MRI). Unable to proceed with R wrist aspiration until we can prove there is an effusion    MRI of R shoulder pending as well    - L shoulder effusion, RTC tear, complete tear of long head of biceps, GH arthritis              Pt reports chronic shoulder issues              Given effusion its possible there is some acute injury there as well but suspect RTC tear is somewhat chronic in nature                           As above will have IR perform arthrocentesis   - ID Continue abx at the direction of the ID service     - Dispo:             Will continue to follow    Keep NPO for now   Pt possible endocarditis. Plan for TEE next week     Mearl Latin, PA-C [phone] (C) 04/04/2023, 12:35 PM  [facility] [address] [place] [address] [phone] [place]814 [phone] (F)    After 5pm and on the weekends please log on to Amion, go to orthopaedics and the look under the [facility] Call for the provider(s) on call. You can also call our office at [phone] and then follow the prompts to be connected to the call team.  Patient ID: Peter [name], male   DOB: 1951/07/06, 72 y.o.   MRN: [id number]

## 2023-04-04 NOTE — Plan of Care (Signed)
  Problem: Health Behavior/Discharge Planning: Goal: Ability to manage health-related needs will improve Outcome: Progressing   Problem: Clinical Measurements: Goal: Will remain free from infection Outcome: Progressing Goal: Respiratory complications will improve Outcome: Progressing Goal: Cardiovascular complication will be avoided Outcome: Progressing   Problem: Activity: Goal: Risk for activity intolerance will decrease Outcome: Progressing   Problem: Coping: Goal: Level of anxiety will decrease Outcome: Progressing   Problem: Elimination: Goal: Will not experience complications related to bowel motility Outcome: Progressing Goal: Will not experience complications related to urinary retention Outcome: Progressing   Problem: Safety: Goal: Ability to remain free from injury will improve Outcome: Progressing

## 2023-04-04 NOTE — Care Management Important Message (Signed)
Important Message  Patient Details  Name: Peter [name] MRN: [id number] Date of Birth: 03-Feb-1951   Medicare Important Message Given:  Yes     Lindora Alviar 04/04/2023, 3:00 PM

## 2023-04-04 NOTE — Progress Notes (Signed)
Physical Therapy Treatment Patient Details Name: Peter [name] MRN: [id number] DOB: May 16, 1951 Today's Date: 04/04/2023   History of Present Illness Pt is a 72 y.o. male admitted 04/01/23 after presenting to ED with generalized weakness, nausea, vomiting and intermittent fevers and chills since Sunday morning. Pt subsequently found have aspiration pneumonia and found to be septic with blood cultures positive for Staph aureus, BCID with MSSA. Pt further reported falling on Sunday evening, unable to get up and on the floor for a few hours, bruising and swelling noted around Left eye/face. PMH significant of ESRD on HD MWF, Type 2 DM, anemia of CKD, hypertension, paroxysmal atrial fibrillation, and hyperlipidemia.    PT Comments  Pt continues to be limited by fatigue and pain in all extremities, but improved tolerance of today's session compared to evaluation. Pt continues to require maxA for bed mobility with increased time to pivot hips fully to EOB, but maintaining balance for ~5 minutes with close guard for safety. Pt declined attempting any stands or transfers due to fatigue at EOB and overall pain, able to complete a few LE exercises before requesting to return to supine. Discharge recommendations remain appropriate, acute PT will continue to follow.      Assistance Recommended at Discharge Frequent or constant Supervision/Assistance  If plan is discharge home, recommend the following:  Can travel by private vehicle    Two people to help with walking and/or transfers;A lot of help with bathing/dressing/bathroom;Assistance with cooking/housework;Assist for transportation;Help with stairs or ramp for entrance;Direct supervision/assist for medications management   No  Equipment Recommendations  None recommended by PT    Recommendations for Other Services       Precautions / Restrictions Precautions Precautions: Fall;Other (comment) Precaution Comments: watch O2 and  BP Restrictions Weight Bearing Restrictions: No     Mobility  Bed Mobility Overal bed mobility: Needs Assistance Bed Mobility: Supine to Sit, Sit to Supine     Supine to sit: Max assist, HOB elevated Sit to supine: Max assist, HOB elevated   General bed mobility comments: maxA for bed mobility with assist for trunk and RLE management    Transfers                   General transfer comment: pt declining attempting today    Ambulation/Gait               General Gait Details: unable   Stairs             Wheelchair Mobility     Tilt Bed    Modified Rankin (Stroke Patients Only)       Balance Overall balance assessment: Needs assistance, History of Falls Sitting-balance support: Single extremity supported, Feet supported Sitting balance-Leahy Scale: Fair Sitting balance - Comments: minA needed initially but progressing to close guard, maintaining at EOB ~5 minutes                                    Cognition Arousal/Alertness: Awake/alert Behavior During Therapy: Flat affect Overall Cognitive Status: Impaired/Different from baseline Area of Impairment: Memory, Following commands, Safety/judgement, Awareness, Problem solving, Attention                   Current Attention Level: Focused Memory: Decreased short-term memory Following Commands: Follows one step commands with increased time, Follows one step commands inconsistently Safety/Judgement: Decreased awareness of safety, Decreased awareness of deficits Awareness: Emergent Problem  Solving: Slow processing, Requires verbal cues, Requires tactile cues General Comments: pt remains oriented but did not recall knee aspiration, continuing to require increased time to participate. Mildly drowsy but participatory during session        Exercises General Exercises - Lower Extremity Long Arc Quad: AROM, Both, 10 reps, Seated Hip Flexion/Marching: AROM, Both, 10 reps,  Seated    General Comments General comments (skin integrity, edema, etc.): Pt reports feeling a little lightheaded, BP 95/58 at EOB, improving to 113/90 once back in supine      Pertinent Vitals/Pain Pain Assessment Pain Assessment: Faces Faces Pain Scale: Hurts even more Pain Location: generalized throughout 4 extremities Pain Descriptors / Indicators: Aching, Discomfort, Grimacing, Guarding Pain Intervention(s): Limited activity within patient's tolerance, Monitored during session, Repositioned    Home Living                          Prior Function            PT Goals (current goals can now be found in the care plan section) Acute Rehab PT Goals Patient Stated Goal: get better PT Goal Formulation: With patient Time For Goal Achievement: 04/16/23 Potential to Achieve Goals: Fair Progress towards PT goals: Progressing toward goals    Frequency    Min 3X/week      PT Plan Current plan remains appropriate    Co-evaluation              AM-PAC PT "6 Clicks" Mobility   Outcome Measure  Help needed turning from your back to your side while in a flat bed without using bedrails?: A Lot Help needed moving from lying on your back to sitting on the side of a flat bed without using bedrails?: A Lot Help needed moving to and from a bed to a chair (including a wheelchair)?: Total Help needed standing up from a chair using your arms (e.g., wheelchair or bedside chair)?: Total Help needed to walk in hospital room?: Total Help needed climbing 3-5 steps with a railing? : Total 6 Click Score: 8    End of Session   Activity Tolerance: Patient limited by fatigue;Patient limited by pain Patient left: in bed;with call bell/phone within reach;with bed alarm set Nurse Communication: Mobility status PT Visit Diagnosis: Muscle weakness (generalized) (M62.81);History of falling (Z91.81);Other abnormalities of gait and mobility (R26.89);Pain Pain - Right/Left: Right Pain  - part of body: Knee     Time: 1610-9604 PT Time Calculation (min) (ACUTE ONLY): 17 min  Charges:    $Therapeutic Activity: 8-22 mins PT General Charges $$ ACUTE PT VISIT: 1 Visit                     Lindalou Hose, PT DPT Acute Rehabilitation Services Office [phone]    Leonie Man 04/04/2023, 2:22 PM

## 2023-04-04 NOTE — Progress Notes (Signed)
ANTICOAGULATION CONSULT NOTE - follow-up  Pharmacy Consult for heparin Indication: atrial fibrillation  Allergies  Allergen Reactions   Doxazosin Mesylate     ineffective for BP (10/2014)   Triamterene-Hctz     increased creatinine (see 09/2014 office note)    Patient Measurements: Height: 5\' 6"  (167.6 cm) Weight: 98 kg (216 lb 0.8 oz) IBW/kg (Calculated) : 63.8 Heparin Dosing Weight: 85.6 kg  Vital Signs: Temp: 98 F (36.7 C) (07/05 0356) Temp Source: Oral (07/05 0356) BP: 115/65 (07/05 0356) Pulse Rate: 67 (07/05 0356)  Labs: Recent Labs    04/01/23 0851 04/01/23 1447 04/02/23 0340 04/02/23 0555 04/03/23 0140 04/03/23 0730 04/03/23 2115 04/04/23 0441  HGB 13.8  --  12.9*  --  12.1*  --   --  11.7*  HCT 40.6  --  37.0*  --  35.2*  --   --  33.5*  PLT 85*  --  77*  --  87*  --   --  98*  APTT 33  --   --   --  71* 71*  --  78*  LABPROT 19.0*  --   --   --   --   --   --   --   INR 1.6*  --   --   --   --   --   --   --   HEPARINUNFRC  --   --   --   --  >1.10*  --   --  >1.10*  CREATININE 13.88*   < >  --  7.96*  --   --  7.58* 8.18*   < > = values in this interval not displayed.     Estimated Creatinine Clearance: 8.7 mL/min (A) (by C-G formula based on SCr of 8.18 mg/dL (H)).   Medical History: Past Medical History:  Diagnosis Date   Anemia in chronic kidney disease (CKD)    CKD (chronic kidney disease) stage 5, GFR less than 15 ml/min (HCC)    M-W-F   Diabetes mellitus without complication (HCC) 03/2012   Hx type II - no meds, diet controlled, lost weight   ED (erectile dysfunction)    Heart murmur    not significant   History of kidney stones    passed 1 , 1 was removed with kidney disease in 2010   Hypercholesteremia    Hypertension    Iron deficiency    Kidney stone 2010   MVP (mitral valve prolapse)    Posterior with MR by ECHO   OSA (obstructive sleep apnea)    does not use cpap, lost weight    Renal oncocytoma of left kidney 2010   w  partial nephrectomy   Syncope     Medications:  Medications Prior to Admission  Medication Sig Dispense Refill Last Dose   acetaminophen (TYLENOL) 500 MG tablet Take 1,000 mg by mouth as needed for mild pain or moderate pain.   Past Week   apixaban (ELIQUIS) 5 MG TABS tablet Take 1 tablet (5 mg total) by mouth 2 (two) times daily. 60 tablet 11 Past Week at 22:00   atorvastatin (LIPITOR) 10 MG tablet Take 10 mg by mouth at bedtime.   Past Week   AURYXIA 1 GM 210 MG(Fe) tablet Take 420-630 mg by mouth See admin instructions. Take 630mg  by mouth three times daily with meals and take 420mg  by mouth with snacks.   Past Week   VITAMIN D, CHOLECALCIFEROL, PO Take 1 tablet by mouth once a  week. Patient had Dialysis on Monday, Wednesday, and Friday.      Scheduled:   (feeding supplement) PROSource Plus  30 mL Oral BID BM   atorvastatin  10 mg Oral QHS   bupivacaine(PF)  10 mL Infiltration Once   Chlorhexidine Gluconate Cloth  6 each Topical Daily   Chlorhexidine Gluconate Cloth  6 each Topical Q0600   cinacalcet  180 mg Oral Q breakfast   ferric citrate  630 mg Oral TID WC   metoprolol tartrate  12.5 mg Oral BID   multivitamin  1 tablet Oral QHS   pantoprazole (PROTONIX) IV  40 mg Intravenous Q12H    Assessment: 72 YOM w/ prior history of AF requiring heparin gtt. Last dose of apixaban given ~10 AM on 04/02/2023  7/5 AM update: HL >1.10- effects of apixaban aPTT 78 seconds (therapeutic) Hgb 11.7 / PLT 98  No signs of bleeding or issues with the heparin infusion  Goal of Therapy:  Heparin level 0.3-0.7 units/ml aPTT 66-102 seconds Monitor platelets by anticoagulation protocol: Yes   Plan:  Continue heparin infusion at 1200 units/hr Check daily (aPTT / HL) while on heparin May stop aPTT when HL correlates Continue to monitor H&H and platelets  Nicole Kindred, PharmD 04/04/23 [phone]

## 2023-04-04 NOTE — Progress Notes (Signed)
Received patient in bed,during the last 1.5 hours of his treatment.Awake alert and oriented x 3.  Access used :Right upper arm AVF that worked well.  Duration of treatment: 4 hours.  Fluid removed: 1.4 our of 2 liters prescribed.  Hemo comment: Several episodes of hypotension during the first 2 hours of her treatment. At one time her bp dropped to md 80"s  thus 100 cc of NS was given.   Dent to CT scan after HD.  Hand off to the patient's nurse.

## 2023-04-04 NOTE — Progress Notes (Signed)
[facility] Progress Note    Assessment/ Plan: Pt is a 72 y.o. yo male with ESRD, T2DM, HTN, OSA, A-fib (on Eliquis), HL who was admitted with pneumonia.    Dialysis Orders:  MWF at [facility] 4hr, 450/800, EDW 97.5kg, 3K/2Ca, AVG, 15g, heparin 10,000 bolus - no ESA, Hgb > 12 - No VDRA (recently stopped d/t Ca 10.9)   # Sepsis due to aspiration pneumonia, septic joint: Currently on antibiotics, requiring oxygenation. Per primary team.  # MSSA bacteremia: 1st set Blood Cx 7/2 positive w/ aspirate from knee staph aureus seen by ID w/ rec of nafcillin.  AVG without signs of infection.  # R knee aspirate with ^ WBC: Likely septic arthritis   # ESRD: Continue HD on usual MWF schedule.  Next HD 7/5.    # Anemia of CKD: Hemoglobin above goal.  No need for ESA.   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  Continue Sensipar and Auryxia.  Monitor lab.   # HTN/volume: BP okay.  Off of antihypertensives. Does have edema.  # Elevated LFTs  # A-Fib  Subjective:  Seen in room - alert today. R knee aspirate Cx is positive - ID already following. Severe pain esp right wrist but denies fevers.  Objective Vitals:   04/03/23 2222 04/03/23 2241 04/03/23 2304 04/04/23 0356  BP: (!) 115/49 (!) 118/46 (!) 107/46 115/65  Pulse: 60  60 67  Resp: 18  20 17   Temp:   98.2 F (36.8 C) 98 F (36.7 C)  TempSrc:   Oral Oral  SpO2: 91%  90% 90%  Weight:      Height:       Physical Exam General: Ill appearing man, awake and conversant Heart:Irregularly irregular Lungs: CTA anteriorly, poor effort Abdomen: soft Extremities: 1+ BLE edema; 1+ RUE edema Dialysis Access: R AVG + bruit  Additional Objective Labs: Basic Metabolic Panel: Recent Labs  Lab 04/02/23 0555 04/03/23 2115 04/04/23 0441  NA 131* 130* 127*  K 5.2* 4.4 4.1  CL 90* 87* 89*  CO2 21* 25 24  GLUCOSE 117* 143* 108*  BUN 58* 68* 77*  CREATININE 7.96* 7.58* 8.18*  CALCIUM 9.1 8.7* 8.2*  PHOS 4.7*   --   --    Liver Function Tests: Recent Labs  Lab 04/01/23 1447 04/02/23 0555 04/03/23 1058 04/04/23 0441  AST 172*  --  158* 230*  ALT 65*  --  76* 92*  ALKPHOS 39  --  45 48  BILITOT 1.0  --  1.3* 3.4*  PROT 6.6  --  6.5 5.9*  ALBUMIN 2.5* 2.3* 1.9* 1.7*   CBC: Recent Labs  Lab 04/01/23 0851 04/02/23 0340 04/03/23 0140 04/04/23 0441  WBC 13.8* 9.0 8.8 8.8  NEUTROABS 12.1* 7.5 7.2 6.7  HGB 13.8 12.9* 12.1* 11.7*  HCT 40.6 37.0* 35.2* 33.5*  MCV 98.8 97.4 97.2 96.3  PLT 85* 77* 87* 98*   Blood Culture    Component Value Date/Time   SDES FLUID SYNOVIAL RIGHT KNEE 04/02/2023 2113   SPECREQUEST NONE 04/02/2023 2113   CULT  04/02/2023 2113    RARE STAPHYLOCOCCUS AUREUS CULTURE REINCUBATED FOR BETTER GROWTH Performed at [facility], [address], [place], [place] [address]    REPTSTATUS PENDING 04/02/2023 2113   Studies/Results: ECHOCARDIOGRAM COMPLETE  Result Date: 04/03/2023    ECHOCARDIOGRAM REPORT   Patient Name:   Larita Fife DAVID Bayer Date of Exam: 04/03/2023 Medical Rec #:  [id number]  Height:       66.0 in Accession #:    [id number]        Weight:       216.0 lb Date of Birth:  [birth year]         BSA:          2.067 m Patient Age:    72 years          BP:           106/33 mmHg Patient Gender: M                 HR:           110 bpm. Exam Location:  Inpatient Procedure: 2D Echo, Cardiac Doppler and Color Doppler Indications:    Bacteremia R78.81  History:        Patient has prior history of Echocardiogram examinations, most                 recent 05/30/2018. CHF, Arrythmias:Atrial Fibrillation and                 Bradycardia, Signs/Symptoms:Shortness of Breath and Syncope;                 Risk Factors:Hypertension, Sleep Apnea and Diabetes. ESRD,                 Arteriovenous fistula.  Sonographer:    Lucendia Herrlich Referring Phys: [id number] CORNELIUS N VAN DAM IMPRESSIONS  1. Left ventricular ejection fraction, by estimation, is 60 to 65%. The left ventricle has  normal function. The left ventricle has no regional wall motion abnormalities. There is severe left ventricular hypertrophy. Left ventricular diastolic parameters  are indeterminate.  2. Right ventricular systolic function is normal. The right ventricular size is normal.  3. Left atrial size was moderately dilated.  4. Mild appearing MR but some splay artifact cannot r/o some posterior leaflet prolapse in addition to MAC with anteriorly directed MR Given bacteremia and abnormal posterior mitral annulus suggest TEE to further assess if clnically indicated. The mitral valve is degenerative. Mild mitral valve regurgitation. No evidence of mitral stenosis. Moderate mitral annular calcification.  5. The aortic valve is tricuspid. There is moderate calcification of the aortic valve. There is moderate thickening of the aortic valve. Aortic valve regurgitation is not visualized. Mild aortic valve stenosis.  6. The inferior vena cava is dilated in size with >50% respiratory variability, suggesting right atrial pressure of 8 mmHg. FINDINGS  Left Ventricle: Left ventricular ejection fraction, by estimation, is 60 to 65%. The left ventricle has normal function. The left ventricle has no regional wall motion abnormalities. The left ventricular internal cavity size was normal in size. There is  severe left ventricular hypertrophy. Left ventricular diastolic parameters are indeterminate. Right Ventricle: The right ventricular size is normal. No increase in right ventricular wall thickness. Right ventricular systolic function is normal. Left Atrium: Left atrial size was moderately dilated. Right Atrium: Right atrial size was normal in size. Pericardium: There is no evidence of pericardial effusion. Mitral Valve: Mild appearing MR but some splay artifact cannot r/o some posterior leaflet prolapse in addition to MAC with anteriorly directed MR Given bacteremia and abnormal posterior mitral annulus suggest TEE to further assess if  clnically indicated.  The mitral valve is degenerative in appearance. There is moderate thickening of the mitral valve leaflet(s). There is moderate calcification of the mitral valve leaflet(s). Moderate mitral annular calcification. Mild mitral valve regurgitation. No evidence of mitral  valve stenosis. Tricuspid Valve: The tricuspid valve is normal in structure. Tricuspid valve regurgitation is mild . No evidence of tricuspid stenosis. Aortic Valve: The aortic valve is tricuspid. There is moderate calcification of the aortic valve. There is moderate thickening of the aortic valve. Aortic valve regurgitation is not visualized. Mild aortic stenosis is present. Aortic valve mean gradient measures 9.0 mmHg. Aortic valve peak gradient measures 16.6 mmHg. Aortic valve area, by VTI measures 1.54 cm. Pulmonic Valve: The pulmonic valve was normal in structure. Pulmonic valve regurgitation is not visualized. No evidence of pulmonic stenosis. Aorta: The aortic root is normal in size and structure. Venous: The inferior vena cava is dilated in size with greater than 50% respiratory variability, suggesting right atrial pressure of 8 mmHg. IAS/Shunts: No atrial level shunt detected by color flow Doppler.  LEFT VENTRICLE PLAX 2D LVIDd:         4.00 cm   Diastology LVIDs:         2.60 cm   LV e' lateral:   13.40 cm/s LV PW:         1.50 cm   LV E/e' lateral: 8.8 LV IVS:        1.80 cm LVOT diam:     2.30 cm LV SV:         51 LV SV Index:   24 LVOT Area:     4.15 cm  RIGHT VENTRICLE             IVC RV S prime:     15.20 cm/s  IVC diam: 2.10 cm TAPSE (M-mode): 1.9 cm LEFT ATRIUM            Index        RIGHT ATRIUM           Index LA diam:      4.50 cm  2.18 cm/m   RA Area:     19.40 cm LA Vol (A2C): 104.0 ml 50.33 ml/m  RA Volume:   46.80 ml  22.65 ml/m LA Vol (A4C): 128.0 ml 61.94 ml/m  AORTIC VALVE AV Area (Vmax):    1.77 cm AV Area (Vmean):   1.67 cm AV Area (VTI):     1.54 cm AV Vmax:           204.00 cm/s AV Vmean:           137.000 cm/s AV VTI:            0.329 m AV Peak Grad:      16.6 mmHg AV Mean Grad:      9.0 mmHg LVOT Vmax:         87.00 cm/s LVOT Vmean:        55.067 cm/s LVOT VTI:          0.122 m LVOT/AV VTI ratio: 0.37  AORTA Ao Root diam: 3.70 cm Ao Asc diam:  3.90 cm MITRAL VALVE                TRICUSPID VALVE MV Area (PHT): 4.15 cm     TR Peak grad:   30.7 mmHg MV Decel Time: 183 msec     TR Vmax:        277.00 cm/s MR Peak grad: 31.4 mmHg MR Vmax:      280.00 cm/s   SHUNTS MV E velocity: 118.00 cm/s  Systemic VTI:  0.12 m MV A velocity: 45.30 cm/s   Systemic Diam: 2.30 cm MV E/A ratio:  2.60 Charlton Haws MD  Electronically signed by Charlton Haws MD Signature Date/Time: 04/03/2023/5:07:21 PM    Final    MR SHOULDER LEFT WO CONTRAST  Result Date: 04/03/2023 CLINICAL DATA:  Left shoulder pain. EXAM: MRI OF THE LEFT SHOULDER WITHOUT CONTRAST TECHNIQUE: Multiplanar, multisequence MR imaging of the shoulder was performed. No intravenous contrast was administered. COMPARISON:  None Available. FINDINGS: Rotator cuff: Complete tear of the supraspinatus and infraspinatus tendons with 5.7 cm of retraction. Teres minor tendon is intact. Moderate tendinosis of the subscapularis tendon with a small partial-thickness tear. Muscles: Severe atrophy of the supraspinatus, infraspinatus and subscapularis muscles. Biceps Long Head: Complete tear of the proximal long head of the biceps tendon. Acromioclavicular Joint: Moderate arthropathy of the acromioclavicular joint. Small amount of subacromial/subdeltoid bursal fluid. Glenohumeral Joint: Large joint effusion. Partial-thickness cartilage loss of the glenohumeral joint with areas of high-grade partial-thickness cartilage loss. Labrum: Generalized labral degeneration. Bones: No fracture or dislocation. No aggressive osseous lesion. Other: No fluid collection or hematoma. Muscle edema in the short of the biceps muscle and coracobrachialis muscle concerning for muscle strain. IMPRESSION:  1. Complete tear of the supraspinatus and infraspinatus tendons with 5.7 cm of retraction. 2. Moderate tendinosis of the subscapularis tendon with a small partial-thickness tear. 3. Complete tear of the proximal long head of the biceps tendon. 4. Moderate osteoarthritis of the glenohumeral joint. Large glenohumeral joint effusion. Electronically Signed   By: Elige Ko M.D.   On: 04/03/2023 10:45   MR CERVICAL SPINE WO CONTRAST  Result Date: 04/03/2023 CLINICAL DATA:  In stage renal disease, cervical bone lesion. EXAM: MRI CERVICAL SPINE WITHOUT CONTRAST TECHNIQUE: Multiplanar, multisequence MR imaging of the cervical spine was performed. No intravenous contrast was administered. COMPARISON:  CT cervical spine 04/01/2023 FINDINGS: Alignment: Physiologic. Vertebrae: No acute fracture, evidence of discitis, or aggressive bone lesion. Cord: Normal signal and morphology. Posterior Fossa, vertebral arteries, paraspinal tissues: Posterior fossa demonstrates no focal abnormality. Vertebral artery flow voids are maintained. Paraspinal soft tissues are unremarkable. Disc levels: Discs: Degenerative disease with disc height loss at C4-5 and C5-6. Degenerative disease with disc height loss at C7-T1, T1-2 and T2-3. C2-3: Small central disc protrusion. Moderate right and mild left facet arthropathy. Mild bilateral foraminal stenosis. No spinal stenosis. C3-4: Broad-based disc bulge. Broad-based disc bulge with a small central disc protrusion. Moderate bilateral facet arthropathy. No severe bilateral foraminal stenosis. Severe spinal stenosis. C4-5: Broad-based disc bulge with a broad central disc protrusion. Severe spinal stenosis. Severe bilateral foraminal stenosis. C5-6: Mild broad-based disc bulge. Moderate right facet arthropathy. Severe bilateral foraminal stenosis. Mild spinal stenosis. C6-7: Mild broad-based disc bulge. Mild bilateral facet arthropathy. Severe right foraminal stenosis. Moderate left foraminal  stenosis. No spinal stenosis. C7-T1: Minimal broad-based disc bulge. No foraminal or central canal stenosis. T1-2: Broad-based disc bulge. Moderate bilateral foraminal stenosis. No spinal stenosis. Bilateral facet arthropathy. T2-3: Broad-based disc bulge. Bilateral facet arthropathy. Mild bilateral foraminal stenosis. IMPRESSION: 1. Diffuse cervical spine spondylosis as described above. 2. No acute osseous injury of the cervical spine. 3. No aggressive osseous lesion of the cervical spine. 4. At C3-4 there is a broad-based disc bulge with a small central disc protrusion. Moderate bilateral facet arthropathy. No severe bilateral foraminal stenosis. Severe spinal stenosis. 5. At C4-5 there is a broad-based disc bulge with a broad central disc protrusion. Severe spinal stenosis. Severe bilateral foraminal stenosis. 6. At C5-6 there is a mild broad-based disc bulge. Moderate right facet arthropathy. Severe bilateral foraminal stenosis. Mild spinal stenosis. 7. At C6-7 there is a  mild broad-based disc bulge. Mild bilateral facet arthropathy. Severe right foraminal stenosis. Moderate left foraminal stenosis. Electronically Signed   By: Elige Ko M.D.   On: 04/03/2023 10:40   VAS [place] LOWER EXTREMITY VENOUS (DVT)  Result Date: 04/02/2023  Lower Venous DVT Study Patient Name:  DMITRI TSO  Date of Exam:   04/02/2023 Medical Rec #: [id number]          Accession #:    [id number] Date of Birth: [birth year]          Patient Gender: M Patient Age:   66 years Exam Location:  [facility] Procedure:      VAS [place] LOWER EXTREMITY VENOUS (DVT) Referring Phys: Kathlen Mody --------------------------------------------------------------------------------  Indications: Swelling.  Limitations: Body habitus and poor ultrasound/tissue interface. Comparison Study: Previous 01/09/18 negative. Performing Technologist: McKayla Maag RVT, [place]  Examination Guidelines: A complete evaluation includes B-mode imaging, spectral Doppler, color  Doppler, and power Doppler as needed of all accessible portions of each vessel. Bilateral testing is considered an integral part of a complete examination. Limited examinations for reoccurring indications may be performed as noted. The reflux portion of the exam is performed with the patient in reverse Trendelenburg.  +-----+---------------+---------+-----------+----------+--------------+ RIGHTCompressibilityPhasicitySpontaneityPropertiesThrombus Aging +-----+---------------+---------+-----------+----------+--------------+ CFV  Full           Yes      Yes                                 +-----+---------------+---------+-----------+----------+--------------+ SFJ  Full                                                        +-----+---------------+---------+-----------+----------+--------------+   +---------+---------------+---------+-----------+----------+-------------------+ LEFT     CompressibilityPhasicitySpontaneityPropertiesThrombus Aging      +---------+---------------+---------+-----------+----------+-------------------+ CFV      Full           Yes      Yes                                      +---------+---------------+---------+-----------+----------+-------------------+ SFJ      Full                                                             +---------+---------------+---------+-----------+----------+-------------------+ FV Prox  Full                                                             +---------+---------------+---------+-----------+----------+-------------------+ FV Mid   Full                                                             +---------+---------------+---------+-----------+----------+-------------------+  FV DistalFull                                                             +---------+---------------+---------+-----------+----------+-------------------+ PFV      Full                                                              +---------+---------------+---------+-----------+----------+-------------------+ POP      Full           Yes      Yes                                      +---------+---------------+---------+-----------+----------+-------------------+ PTV      Full                                                             +---------+---------------+---------+-----------+----------+-------------------+ PERO                                                  Not well visualized +---------+---------------+---------+-----------+----------+-------------------+    Summary: RIGHT: - No evidence of common femoral vein obstruction.  LEFT: - There is no evidence of deep vein thrombosis in the lower extremity. However, portions of this examination were limited- see technologist comments above.  - No cystic structure found in the popliteal fossa.  *See table(s) above for measurements and observations. Electronically signed by Coral Else MD on 04/02/2023 at 9:15:20 PM.    Final    CT ABDOMEN PELVIS W CONTRAST  Result Date: 04/02/2023 CLINICAL DATA:  Abdominal pain, acute, nonlocalized. End stage renal disease. Nausea and vomiting. Sepsis. EXAM: CT ABDOMEN AND PELVIS WITH CONTRAST TECHNIQUE: Multidetector CT imaging of the abdomen and pelvis was performed using the standard protocol following bolus administration of intravenous contrast. RADIATION DOSE REDUCTION: This exam was performed according to the departmental dose-optimization program which includes automated exposure control, adjustment of the mA and/or kV according to patient size and/or use of iterative reconstruction technique. CONTRAST:  75mL OMNIPAQUE IOHEXOL 350 MG/ML SOLN COMPARISON:  04/05/2009 FINDINGS: Lower chest: Mild dependent atelectasis at the lung bases. Tiny amount of pleural fluid. Non dependent lung as well aerated. There is cardiomegaly and coronary artery calcification. Hepatobiliary: No liver parenchymal abnormality. No calcified  gallstones. No ductal dilatation. Pancreas: Normal Spleen: Normal Adrenals/Urinary Tract: Adrenal glands are normal. Atrophic kidneys, more so on the left than the right. No hydroureteronephrosis. No stone or mass. Bladder is not well seen because of artifact from a left hip replacement. No bladder distension. Stomach/Bowel: Stomach and small intestine are normal. Normal appendix. No abnormal colon finding. Diverticulosis without evidence of diverticulitis. Vascular/Lymphatic: Aorta and IVC are normal.  No adenopathy. Reproductive: Normal Other:  No free fluid or air. Musculoskeletal: Ordinary mild degenerative change affects the spine. IMPRESSION: 1. No acute finding by CT. 2. Atrophic kidneys, more so on the left than the right. 3. Diverticulosis without evidence of diverticulitis. 4. Cardiomegaly and coronary artery calcification. 5. Mild dependent atelectasis at the lung bases. Tiny amount of pleural fluid. Electronically Signed   By: Paulina Fusi M.D.   On: 04/02/2023 13:01   Medications:  heparin 1,200 Units/hr (04/03/23 1733)   nafcillin IV 2 g (04/04/23 0455)    (feeding supplement) PROSource Plus  30 mL Oral BID BM   atorvastatin  10 mg Oral QHS   bupivacaine(PF)  10 mL Infiltration Once   Chlorhexidine Gluconate Cloth  6 each Topical Daily   Chlorhexidine Gluconate Cloth  6 each Topical Q0600   cinacalcet  180 mg Oral Q breakfast   ferric citrate  630 mg Oral TID WC   metoprolol tartrate  12.5 mg Oral BID   multivitamin  1 tablet Oral QHS   pantoprazole (PROTONIX) IV  40 mg Intravenous Q12H

## 2023-04-04 NOTE — TOC Initial Note (Signed)
Transition of Care [facility]) - Initial/Assessment Note    Patient Details  Name: Peter [name] MRN: [id number] Date of Birth: [birth year]  [facility] CM/SW Contact:    Mearl Latin, LCSW Phone Number: 04/04/2023, 4:47 PM  Clinical Narrative:                 Patient currently in dialysis and TEE planned for next week. CSW will continue to follow and assess for SNF placement that can transport to dialysis outpatient.     Barriers to Discharge: Continued Medical Work up   Patient Goals and CMS Choice            Expected Discharge Plan and Services In-house Referral: Clinical Social Work   Post Acute Care Choice: Dialysis Living arrangements for the past 2 months: Single Family Home                                      Prior Living Arrangements/Services Living arrangements for the past 2 months: Single Family Home   Patient language and need for interpreter reviewed:: Yes Do you feel safe going back to the place where you live?: Yes      Need for Family Participation in Patient Care: Yes (Comment) Care giver support system in place?: Yes (comment)   Criminal Activity/Legal Involvement Pertinent to Current Situation/Hospitalization: No - Comment as needed  Activities of Daily Living      Permission Sought/Granted                  Emotional Assessment Appearance:: Appears stated age     Orientation: : Oriented to Self, Oriented to Place, Oriented to  Time, Oriented to Situation Alcohol / Substance Use: Not Applicable Psych Involvement: No (comment)  Admission diagnosis:  Aspiration pneumonia (HCC) [J69.0] Sepsis, due to unspecified organism, unspecified whether acute organ dysfunction present [facility]) [A41.9] Patient Active Problem List   Diagnosis Date Noted   Staphylococcal arthritis of right shoulder (HCC) 04/04/2023   MSSA bacteremia 04/03/2023   Staphylococcal arthritis of right knee (HCC) 04/03/2023   Staphylococcal arthritis of  right wrist (HCC) 04/03/2023   Staphylococcal arthritis of left shoulder (HCC) 04/03/2023   Aspiration pneumonia (HCC) 04/01/2023   PAF (paroxysmal atrial fibrillation) (HCC) 03/18/2023   Chronic diastolic HF (heart failure) (HCC) 02/24/2023   Bradycardia 02/24/2023   Arteriovenous fistula, acquired (HCC) 03/29/2021   Chronic kidney disease due to benign hypertension 03/29/2021   Chronic kidney disease, stage 5 (HCC) 03/29/2021   Dependence on renal dialysis (HCC) 03/29/2021   Diabetic renal disease (HCC) 03/29/2021   Kidney stone 03/29/2021   Erectile dysfunction 03/29/2021   Personal history of colonic polyps 03/29/2021   Pure hypercholesterolemia 03/29/2021   Encounter for removal of sutures 09/01/2020   Headache, unspecified 05/06/2020   Allergy, unspecified, initial encounter 05/03/2019   Anaphylactic shock, unspecified, initial encounter 05/03/2019   ESRD on dialysis (HCC) 10/08/2018   Hypokalemia 06/25/2018   Unspecified protein-calorie malnutrition (HCC) 06/24/2018   Encounter for immunization 06/22/2018   Anemia in chronic kidney disease 06/08/2018   Coagulation defect, unspecified (HCC) 06/08/2018   Complication of vascular dialysis catheter 06/08/2018   Hyperparathyroidism due to renal insufficiency (HCC) 06/08/2018   Peripheral vascular disease (HCC) 06/08/2018   Iron deficiency anemia, unspecified 06/08/2018   Pruritus, unspecified 06/08/2018   Shortness of breath 06/08/2018   Type 2 diabetes mellitus with other diabetic kidney complication (HCC)  06/08/2018   Elevated troponin 05/30/2018   Elevated TSH 05/30/2018   OSA on CPAP 05/29/2018   Syncope and collapse 05/29/2018   Syncope 05/29/2018   Morbid obesity (HCC) 10/13/2013   Diastolic dysfunction 10/13/2013   MVP (mitral valve prolapse)    Obstructive sleep apnea 09/27/2013   Essential hypertension, benign 09/27/2013   PCP:  [name], MD Pharmacy:   [facility] [phone] - [place], [place] - [address] AT [facility] [address] [place] [address] Phone: [phone] Fax: [phone]     Social Determinants of Health (SDOH) Social History: SDOH Screenings   Food Insecurity: No Food Insecurity (04/01/2023)  Housing: Patient Declined (04/01/2023)  Transportation Needs: No Transportation Needs (04/01/2023)  Utilities: Not At Risk (04/01/2023)  Financial Resource Strain: Low Risk  (05/30/2018)  Physical Activity: Inactive (05/30/2018)  Social Connections: Socially Integrated (05/30/2018)  Stress: No Stress Concern Present (05/30/2018)  Tobacco Use: High Risk (04/01/2023)   SDOH Interventions:     Readmission Risk Interventions     No data to display

## 2023-04-04 NOTE — Progress Notes (Signed)
Subjective: Planing of right shoulder pain   Antibiotics:  Anti-infectives (From admission, onward)    Start     Dose/Rate Route Frequency Ordered Stop   04/03/23 1200  nafcillin 2 g in sodium chloride 0.9 % 100 mL IVPB        2 g 216 mL/hr over 30 Minutes Intravenous Every 4 hours 04/03/23 1112     04/03/23 1145  nafcillin injection 2 g  Status:  Discontinued        2 g Intravenous Every 4 hours 04/03/23 1050 04/03/23 1112   04/02/23 1245  Ampicillin-Sulbactam (UNASYN) 3 g in sodium chloride 0.9 % 100 mL IVPB  Status:  Discontinued        3 g 200 mL/hr over 30 Minutes Intravenous Daily 04/02/23 1152 04/03/23 1050   04/01/23 2200  ceFAZolin (ANCEF) IVPB 1 g/50 mL premix  Status:  Discontinued        1 g 100 mL/hr over 30 Minutes Intravenous Every 24 hours 04/01/23 2039 04/02/23 1146   04/01/23 0830  cefTRIAXone (ROCEPHIN) 2 g in sodium chloride 0.9 % 100 mL IVPB  Status:  Discontinued        2 g 200 mL/hr over 30 Minutes Intravenous Every 24 hours 04/01/23 0823 04/01/23 2039   04/01/23 0830  azithromycin (ZITHROMAX) 500 mg in sodium chloride 0.9 % 250 mL IVPB  Status:  Discontinued        500 mg 250 mL/hr over 60 Minutes Intravenous Every 24 hours 04/01/23 0823 04/01/23 2039       Medications: Scheduled Meds:  (feeding supplement) PROSource Plus  30 mL Oral BID BM   atorvastatin  10 mg Oral QHS   bupivacaine(PF)  10 mL Infiltration Once   Chlorhexidine Gluconate Cloth  6 each Topical Daily   Chlorhexidine Gluconate Cloth  6 each Topical Q0600   cinacalcet  180 mg Oral Q breakfast   ferric citrate  630 mg Oral TID WC   metoprolol tartrate  12.5 mg Oral BID   multivitamin  1 tablet Oral QHS   pantoprazole (PROTONIX) IV  40 mg Intravenous Q12H   Continuous Infusions:  heparin 1,200 Units/hr (04/04/23 1340)   nafcillin IV 2 g (04/04/23 0836)   PRN Meds:.acetaminophen, diltiazem, lidocaine (PF), mouth rinse, oxyCODONE    Objective: Weight change:    Intake/Output Summary (Last 24 hours) at 04/04/2023 1448 Last data filed at 04/04/2023 0500 Gross per 24 hour  Intake 245.4 ml  Output --  Net 245.4 ml   Blood pressure (!) 112/57, pulse 72, temperature 97.8 F (36.6 C), resp. rate (!) 0, height 5\' 6"  (1.676 m), weight 98.8 kg, SpO2 92 %. Temp:  [97.8 F (36.6 C)-98.2 F (36.8 C)] 97.8 F (36.6 C) (07/05 1029) Pulse Rate:  [60-107] 72 (07/05 1430) Resp:  [0-24] 0 (07/05 1430) BP: (49-127)/(30-68) 112/57 (07/05 1430) SpO2:  [89 %-98 %] 92 % (07/05 1430) Weight:  [98.8 kg] 98.8 kg (07/05 1029)  Physical Exam: Physical Exam Constitutional:      Appearance: He is obese. He is ill-appearing.  Eyes:     Extraocular Movements: Extraocular movements intact.     Conjunctiva/sclera: Conjunctivae normal.  Cardiovascular:     Rate and Rhythm: Tachycardia present.  Pulmonary:     Effort: Pulmonary effort is normal. No respiratory distress.     Breath sounds: No wheezing or rales.  Abdominal:     General: There is no distension.  Musculoskeletal:  Right shoulder: Swelling and effusion present. Decreased range of motion.     Left shoulder: Swelling and effusion present. Decreased range of motion.     Right wrist: Swelling and tenderness present. Decreased range of motion.     Right knee: Swelling and effusion present. Decreased range of motion.  Skin:    General: Skin is warm.  Neurological:     General: No focal deficit present.     Mental Status: He is alert and oriented to person, place, and time.  Psychiatric:        Mood and Affect: Mood normal.        Behavior: Behavior normal.        Thought Content: Thought content normal.        Judgment: Judgment normal.      CBC:    BMET Recent Labs    04/03/23 2115 04/04/23 0441  NA 130* 127*  K 4.4 4.1  CL 87* 89*  CO2 25 24  GLUCOSE 143* 108*  BUN 68* 77*  CREATININE 7.58* 8.18*  CALCIUM 8.7* 8.2*     Liver Panel  Recent Labs    04/03/23 1058 04/04/23 0441   PROT 6.5 5.9*  ALBUMIN 1.9* 1.7*  AST 158* 230*  ALT 76* 92*  ALKPHOS 45 48  BILITOT 1.3* 3.4*  BILIDIR 0.4*  --   IBILI 0.9  --        Sedimentation Rate No results for input(s): "ESRSEDRATE" in the last 72 hours. C-Reactive Protein Recent Labs    04/03/23 0140 04/04/23 0441  CRP 43.8* 40.7*    Micro Results: Recent Results (from the past 720 hour(s))  Resp panel by RT-PCR (RSV, Flu A&B, Covid) Anterior Nasal Swab     Status: None   Collection Time: 04/01/23  8:23 AM   Specimen: Anterior Nasal Swab  Result Value Ref Range Status   SARS Coronavirus 2 by RT PCR NEGATIVE NEGATIVE Final   Influenza A by PCR NEGATIVE NEGATIVE Final   Influenza B by PCR NEGATIVE NEGATIVE Final    Comment: (NOTE) The Xpert Xpress SARS-CoV-2/FLU/RSV plus assay is intended as an aid in the diagnosis of influenza from Nasopharyngeal swab specimens and should not be used as a sole basis for treatment. Nasal washings and aspirates are unacceptable for Xpert Xpress SARS-CoV-2/FLU/RSV testing.  Fact Sheet for Patients: BloggerCourse.com  Fact Sheet for Healthcare Providers: SeriousBroker.it  This test is not yet approved or cleared by the [place] FDA and has been authorized for detection and/or diagnosis of SARS-CoV-2 by FDA under an Emergency Use Authorization (EUA). This EUA will remain in effect (meaning this test can be used) for the duration of the COVID-19 declaration under Section 564(b)(1) of the Act, 21 U.S.C. section 360bbb-3(b)(1), unless the authorization is terminated or revoked.     Resp Syncytial Virus by PCR NEGATIVE NEGATIVE Final    Comment: (NOTE) Fact Sheet for Patients: BloggerCourse.com  Fact Sheet for Healthcare Providers: SeriousBroker.it  This test is not yet approved or cleared by the [place] FDA and has been authorized for detection and/or  diagnosis of SARS-CoV-2 by FDA under an Emergency Use Authorization (EUA). This EUA will remain in effect (meaning this test can be used) for the duration of the COVID-19 declaration under Section 564(b)(1) of the Act, 21 U.S.C. section 360bbb-3(b)(1), unless the authorization is terminated or revoked.  Performed at [facility], [address] [place], [place] [address]   Blood Culture (routine x 2)  Status: Abnormal   Collection Time: 04/01/23  8:51 AM   Specimen: BLOOD LEFT FOREARM  Result Value Ref Range Status   Specimen Description BLOOD LEFT FOREARM  Final   Special Requests   Final    BOTTLES DRAWN AEROBIC AND ANAEROBIC Blood Culture results may not be optimal due to an excessive volume of blood received in culture bottles   Culture  Setup Time   Final    GRAM POSITIVE COCCI IN CLUSTERS IN BOTH AEROBIC AND ANAEROBIC BOTTLES CRITICAL RESULT CALLED TO, READ BACK BY AND VERIFIED WITH: PHARMD HAILEY VONDOLEN ON 04/01/23 @ 2017 BY DRT Performed at [facility], [address] [place], [place] [address]    Culture STAPHYLOCOCCUS AUREUS (A)  Final   Report Status 04/03/2023 FINAL  Final   Organism ID, Bacteria STAPHYLOCOCCUS AUREUS  Final      Susceptibility   Staphylococcus aureus - MIC*    CIPROFLOXACIN <=0.5 SENSITIVE Sensitive     ERYTHROMYCIN <=0.25 SENSITIVE Sensitive     GENTAMICIN <=0.5 SENSITIVE Sensitive     OXACILLIN <=0.25 SENSITIVE Sensitive     TETRACYCLINE <=1 SENSITIVE Sensitive     VANCOMYCIN 1 SENSITIVE Sensitive     TRIMETH/SULFA <=10 SENSITIVE Sensitive     CLINDAMYCIN <=0.25 SENSITIVE Sensitive     RIFAMPIN <=0.5 SENSITIVE Sensitive     Inducible Clindamycin NEGATIVE Sensitive     LINEZOLID 2 SENSITIVE Sensitive     * STAPHYLOCOCCUS AUREUS  Blood Culture ID Panel (Reflexed)     Status: Abnormal   Collection Time: 04/01/23  8:51 AM  Result Value Ref Range Status   Enterococcus faecalis NOT DETECTED NOT DETECTED Final   Enterococcus  Faecium NOT DETECTED NOT DETECTED Final   Listeria monocytogenes NOT DETECTED NOT DETECTED Final   Staphylococcus species DETECTED (A) NOT DETECTED Final    Comment: CRITICAL RESULT CALLED TO, READ BACK BY AND VERIFIED WITH: PHARMD HAILEY VONDOLEN ON 04/01/23 @ 2017 BY DRT    Staphylococcus aureus (BCID) DETECTED (A) NOT DETECTED Final    Comment: CRITICAL RESULT CALLED TO, READ BACK BY AND VERIFIED WITH: PHARMD HAILEY VONDOLEN ON 04/01/23 @ 2017 BY DRT    Staphylococcus epidermidis NOT DETECTED NOT DETECTED Final   Staphylococcus lugdunensis NOT DETECTED NOT DETECTED Final   Streptococcus species NOT DETECTED NOT DETECTED Final   Streptococcus agalactiae NOT DETECTED NOT DETECTED Final   Streptococcus pneumoniae NOT DETECTED NOT DETECTED Final   Streptococcus pyogenes NOT DETECTED NOT DETECTED Final   A.calcoaceticus-baumannii NOT DETECTED NOT DETECTED Final   Bacteroides fragilis NOT DETECTED NOT DETECTED Final   Enterobacterales NOT DETECTED NOT DETECTED Final   Enterobacter cloacae complex NOT DETECTED NOT DETECTED Final   Escherichia coli NOT DETECTED NOT DETECTED Final   Klebsiella aerogenes NOT DETECTED NOT DETECTED Final   Klebsiella oxytoca NOT DETECTED NOT DETECTED Final   Klebsiella pneumoniae NOT DETECTED NOT DETECTED Final   Proteus species NOT DETECTED NOT DETECTED Final   Salmonella species NOT DETECTED NOT DETECTED Final   Serratia marcescens NOT DETECTED NOT DETECTED Final   Haemophilus influenzae NOT DETECTED NOT DETECTED Final   Neisseria meningitidis NOT DETECTED NOT DETECTED Final   Pseudomonas aeruginosa NOT DETECTED NOT DETECTED Final   Stenotrophomonas maltophilia NOT DETECTED NOT DETECTED Final   Candida albicans NOT DETECTED NOT DETECTED Final   Candida auris NOT DETECTED NOT DETECTED Final   Candida glabrata NOT DETECTED NOT DETECTED Final   Candida krusei NOT DETECTED NOT DETECTED Final   Candida parapsilosis  NOT DETECTED NOT DETECTED Final   Candida  tropicalis NOT DETECTED NOT DETECTED Final   Cryptococcus neoformans/gattii NOT DETECTED NOT DETECTED Final   Meth resistant mecA/C and MREJ NOT DETECTED NOT DETECTED Final    Comment: Performed at [facility], [address], [place], [place] [address]  Blood Culture (routine x 2)     Status: None (Preliminary result)   Collection Time: 04/01/23  1:09 PM   Specimen: BLOOD LEFT HAND  Result Value Ref Range Status   Specimen Description BLOOD LEFT HAND  Final   Special Requests   Final    BOTTLES DRAWN AEROBIC AND ANAEROBIC Blood Culture adequate volume   Culture   Final    NO GROWTH 3 DAYS Performed at [facility], [address] [place], [place] [address]    Report Status PENDING  Incomplete  Culture, blood (Routine X 2) w Reflex to ID Panel     Status: None (Preliminary result)   Collection Time: 04/01/23 11:15 PM   Specimen: BLOOD  Result Value Ref Range Status   Specimen Description BLOOD BLOOD LEFT ARM  Final   Special Requests   Final    BOTTLES DRAWN AEROBIC AND ANAEROBIC Blood Culture results may not be optimal due to an inadequate volume of blood received in culture bottles   Culture   Final    NO GROWTH 3 DAYS Performed at [facility], [address], [place], [place] [address]    Report Status PENDING  Incomplete  Culture, blood (Routine X 2) w Reflex to ID Panel     Status: None (Preliminary result)   Collection Time: 04/01/23 11:21 PM   Specimen: BLOOD  Result Value Ref Range Status   Specimen Description BLOOD BLOOD LEFT HAND  Final   Special Requests   Final    BOTTLES DRAWN AEROBIC AND ANAEROBIC Blood Culture results may not be optimal due to an inadequate volume of blood received in culture bottles   Culture   Final    NO GROWTH 3 DAYS Performed at [facility], [address], [place], [place] [address]    Report Status PENDING  Incomplete  MRSA Next Gen by PCR, Nasal     Status: None   Collection Time: 04/02/23  5:42 AM    Specimen: Nasal Mucosa; Nasal Swab  Result Value Ref Range Status   MRSA by PCR Next Gen NOT DETECTED NOT DETECTED Final    Comment: (NOTE) The GeneXpert MRSA Assay (FDA approved for NASAL specimens only), is one component of a comprehensive MRSA colonization surveillance program. It is not intended to diagnose MRSA infection nor to guide or monitor treatment for MRSA infections. Test performance is not FDA approved in patients less than 63 years old. Performed at [facility], [address], [place], [place] [address]   Body fluid culture w Gram Stain     Status: None (Preliminary result)   Collection Time: 04/02/23  9:13 PM   Specimen: Body Fluid  Result Value Ref Range Status   Specimen Description FLUID SYNOVIAL RIGHT KNEE  Final   Special Requests NONE  Final   Gram Stain   Final    ABUNDANT WBC PRESENT, PREDOMINANTLY PMN RARE GRAM POSITIVE COCCI INTRACELLULAR Gram Stain Report Called to,Read Back By and Verified With: RN [id number] @ 2258 FH    Culture   Final    RARE STAPHYLOCOCCUS AUREUS SUSCEPTIBILITIES TO FOLLOW Performed at [facility],  [address], [place], [place] [address]    Report Status PENDING  Incomplete  Culture, blood (Routine X 2) w Reflex to ID Panel     Status: None (Preliminary result)   Collection Time: 04/03/23  2:43 PM   Specimen: BLOOD LEFT HAND  Result Value Ref Range Status   Specimen Description BLOOD LEFT HAND  Final   Special Requests   Final    BOTTLES DRAWN AEROBIC AND ANAEROBIC Blood Culture adequate volume   Culture   Final    NO GROWTH < 24 HOURS Performed at [facility], [address], [place], [place] [address]    Report Status PENDING  Incomplete  Culture, blood (Routine X 2) w Reflex to ID Panel     Status: None (Preliminary result)   Collection Time: 04/03/23  2:47 PM   Specimen: BLOOD LEFT HAND  Result Value Ref Range Status   Specimen Description BLOOD LEFT HAND  Final   Special Requests   Final     BOTTLES DRAWN AEROBIC AND ANAEROBIC Blood Culture adequate volume   Culture   Final    NO GROWTH < 24 HOURS Performed at [facility], [address], [place], [place] [address]    Report Status PENDING  Incomplete    Studies/Results: ECHOCARDIOGRAM COMPLETE  Result Date: 04/03/2023    ECHOCARDIOGRAM REPORT   Patient Name:   WAYNE DAVID Mood Date of Exam: 04/03/2023 Medical Rec #:  [id number]         Height:       66.0 in Accession #:    [id number]        Weight:       216.0 lb Date of Birth:  08-13-1951         BSA:          2.067 m Patient Age:    72 years          BP:           106/33 mmHg Patient Gender: M                 HR:           110 bpm. Exam Location:  Inpatient Procedure: 2D Echo, Cardiac Doppler and Color Doppler Indications:    Bacteremia R78.81  History:        Patient has prior history of Echocardiogram examinations, most                 recent 05/30/2018. CHF, Arrythmias:Atrial Fibrillation and                 Bradycardia, Signs/Symptoms:Shortness of Breath and Syncope;                 Risk Factors:Hypertension, Sleep Apnea and Diabetes. ESRD,                 Arteriovenous fistula.  Sonographer:    Lucendia Herrlich Referring Phys: [id number] Jentry Warnell N [place] IMPRESSIONS  1. Left ventricular ejection fraction, by estimation, is 60 to 65%. The left ventricle has normal function. The left ventricle has no regional wall motion abnormalities. There is severe left ventricular hypertrophy. Left ventricular diastolic parameters  are indeterminate.  2. Right ventricular systolic function is normal. The right ventricular size is normal.  3. Left atrial size was moderately dilated.  4. Mild appearing MR but some splay artifact cannot r/o some posterior leaflet prolapse in addition to MAC with anteriorly directed MR Given bacteremia  and abnormal posterior mitral annulus suggest TEE to further assess if clnically indicated. The mitral valve is degenerative. Mild mitral valve regurgitation. No  evidence of mitral stenosis. Moderate mitral annular calcification.  5. The aortic valve is tricuspid. There is moderate calcification of the aortic valve. There is moderate thickening of the aortic valve. Aortic valve regurgitation is not visualized. Mild aortic valve stenosis.  6. The inferior vena cava is dilated in size with >50% respiratory variability, suggesting right atrial pressure of 8 mmHg. FINDINGS  Left Ventricle: Left ventricular ejection fraction, by estimation, is 60 to 65%. The left ventricle has normal function. The left ventricle has no regional wall motion abnormalities. The left ventricular internal cavity size was normal in size. There is  severe left ventricular hypertrophy. Left ventricular diastolic parameters are indeterminate. Right Ventricle: The right ventricular size is normal. No increase in right ventricular wall thickness. Right ventricular systolic function is normal. Left Atrium: Left atrial size was moderately dilated. Right Atrium: Right atrial size was normal in size. Pericardium: There is no evidence of pericardial effusion. Mitral Valve: Mild appearing MR but some splay artifact cannot r/o some posterior leaflet prolapse in addition to MAC with anteriorly directed MR Given bacteremia and abnormal posterior mitral annulus suggest TEE to further assess if clnically indicated.  The mitral valve is degenerative in appearance. There is moderate thickening of the mitral valve leaflet(s). There is moderate calcification of the mitral valve leaflet(s). Moderate mitral annular calcification. Mild mitral valve regurgitation. No evidence of mitral valve stenosis. Tricuspid Valve: The tricuspid valve is normal in structure. Tricuspid valve regurgitation is mild . No evidence of tricuspid stenosis. Aortic Valve: The aortic valve is tricuspid. There is moderate calcification of the aortic valve. There is moderate thickening of the aortic valve. Aortic valve regurgitation is not visualized.  Mild aortic stenosis is present. Aortic valve mean gradient measures 9.0 mmHg. Aortic valve peak gradient measures 16.6 mmHg. Aortic valve area, by VTI measures 1.54 cm. Pulmonic Valve: The pulmonic valve was normal in structure. Pulmonic valve regurgitation is not visualized. No evidence of pulmonic stenosis. Aorta: The aortic root is normal in size and structure. Venous: The inferior vena cava is dilated in size with greater than 50% respiratory variability, suggesting right atrial pressure of 8 mmHg. IAS/Shunts: No atrial level shunt detected by color flow Doppler.  LEFT VENTRICLE PLAX 2D LVIDd:         4.00 cm   Diastology LVIDs:         2.60 cm   LV e' lateral:   13.40 cm/s LV PW:         1.50 cm   LV E/e' lateral: 8.8 LV IVS:        1.80 cm LVOT diam:     2.30 cm LV SV:         51 LV SV Index:   24 LVOT Area:     4.15 cm  RIGHT VENTRICLE             IVC RV S prime:     15.20 cm/s  IVC diam: 2.10 cm TAPSE (M-mode): 1.9 cm LEFT ATRIUM            Index        RIGHT ATRIUM           Index LA diam:      4.50 cm  2.18 cm/m   RA Area:     19.40 cm LA Vol (A2C): 104.0 ml 50.33 ml/m  RA Volume:   46.80 ml  22.65 ml/m LA Vol (A4C): 128.0 ml 61.94 ml/m  AORTIC VALVE AV Area (Vmax):    1.77 cm AV Area (Vmean):   1.67 cm AV Area (VTI):     1.54 cm AV Vmax:           204.00 cm/s AV Vmean:          137.000 cm/s AV VTI:            0.329 m AV Peak Grad:      16.6 mmHg AV Mean Grad:      9.0 mmHg LVOT Vmax:         87.00 cm/s LVOT Vmean:        55.067 cm/s LVOT VTI:          0.122 m LVOT/AV VTI ratio: 0.37  AORTA Ao Root diam: 3.70 cm Ao Asc diam:  3.90 cm MITRAL VALVE                TRICUSPID VALVE MV Area (PHT): 4.15 cm     TR Peak grad:   30.7 mmHg MV Decel Time: 183 msec     TR Vmax:        277.00 cm/s MR Peak grad: 31.4 mmHg MR Vmax:      280.00 cm/s   SHUNTS MV E velocity: 118.00 cm/s  Systemic VTI:  0.12 m MV A velocity: 45.30 cm/s   Systemic Diam: 2.30 cm MV E/A ratio:  2.60 Charlton Haws MD Electronically  signed by Charlton Haws MD Signature Date/Time: 04/03/2023/5:07:21 PM    Final    MR SHOULDER LEFT WO CONTRAST  Result Date: 04/03/2023 CLINICAL DATA:  Left shoulder pain. EXAM: MRI OF THE LEFT SHOULDER WITHOUT CONTRAST TECHNIQUE: Multiplanar, multisequence MR imaging of the shoulder was performed. No intravenous contrast was administered. COMPARISON:  None Available. FINDINGS: Rotator cuff: Complete tear of the supraspinatus and infraspinatus tendons with 5.7 cm of retraction. Teres minor tendon is intact. Moderate tendinosis of the subscapularis tendon with a small partial-thickness tear. Muscles: Severe atrophy of the supraspinatus, infraspinatus and subscapularis muscles. Biceps Long Head: Complete tear of the proximal long head of the biceps tendon. Acromioclavicular Joint: Moderate arthropathy of the acromioclavicular joint. Small amount of subacromial/subdeltoid bursal fluid. Glenohumeral Joint: Large joint effusion. Partial-thickness cartilage loss of the glenohumeral joint with areas of high-grade partial-thickness cartilage loss. Labrum: Generalized labral degeneration. Bones: No fracture or dislocation. No aggressive osseous lesion. Other: No fluid collection or hematoma. Muscle edema in the short of the biceps muscle and coracobrachialis muscle concerning for muscle strain. IMPRESSION: 1. Complete tear of the supraspinatus and infraspinatus tendons with 5.7 cm of retraction. 2. Moderate tendinosis of the subscapularis tendon with a small partial-thickness tear. 3. Complete tear of the proximal long head of the biceps tendon. 4. Moderate osteoarthritis of the glenohumeral joint. Large glenohumeral joint effusion. Electronically Signed   By: Elige Ko M.D.   On: 04/03/2023 10:45   MR CERVICAL SPINE WO CONTRAST  Result Date: 04/03/2023 CLINICAL DATA:  In stage renal disease, cervical bone lesion. EXAM: MRI CERVICAL SPINE WITHOUT CONTRAST TECHNIQUE: Multiplanar, multisequence MR imaging of the cervical  spine was performed. No intravenous contrast was administered. COMPARISON:  CT cervical spine 04/01/2023 FINDINGS: Alignment: Physiologic. Vertebrae: No acute fracture, evidence of discitis, or aggressive bone lesion. Cord: Normal signal and morphology. Posterior Fossa, vertebral arteries, paraspinal tissues: Posterior fossa demonstrates no focal abnormality. Vertebral artery flow voids are maintained. Paraspinal soft tissues are unremarkable.  Disc levels: Discs: Degenerative disease with disc height loss at C4-5 and C5-6. Degenerative disease with disc height loss at C7-T1, T1-2 and T2-3. C2-3: Small central disc protrusion. Moderate right and mild left facet arthropathy. Mild bilateral foraminal stenosis. No spinal stenosis. C3-4: Broad-based disc bulge. Broad-based disc bulge with a small central disc protrusion. Moderate bilateral facet arthropathy. No severe bilateral foraminal stenosis. Severe spinal stenosis. C4-5: Broad-based disc bulge with a broad central disc protrusion. Severe spinal stenosis. Severe bilateral foraminal stenosis. C5-6: Mild broad-based disc bulge. Moderate right facet arthropathy. Severe bilateral foraminal stenosis. Mild spinal stenosis. C6-7: Mild broad-based disc bulge. Mild bilateral facet arthropathy. Severe right foraminal stenosis. Moderate left foraminal stenosis. No spinal stenosis. C7-T1: Minimal broad-based disc bulge. No foraminal or central canal stenosis. T1-2: Broad-based disc bulge. Moderate bilateral foraminal stenosis. No spinal stenosis. Bilateral facet arthropathy. T2-3: Broad-based disc bulge. Bilateral facet arthropathy. Mild bilateral foraminal stenosis. IMPRESSION: 1. Diffuse cervical spine spondylosis as described above. 2. No acute osseous injury of the cervical spine. 3. No aggressive osseous lesion of the cervical spine. 4. At C3-4 there is a broad-based disc bulge with a small central disc protrusion. Moderate bilateral facet arthropathy. No severe bilateral  foraminal stenosis. Severe spinal stenosis. 5. At C4-5 there is a broad-based disc bulge with a broad central disc protrusion. Severe spinal stenosis. Severe bilateral foraminal stenosis. 6. At C5-6 there is a mild broad-based disc bulge. Moderate right facet arthropathy. Severe bilateral foraminal stenosis. Mild spinal stenosis. 7. At C6-7 there is a mild broad-based disc bulge. Mild bilateral facet arthropathy. Severe right foraminal stenosis. Moderate left foraminal stenosis. Electronically Signed   By: Elige Ko M.D.   On: 04/03/2023 10:40      Assessment/Plan:  INTERVAL HISTORY: MRI of wrist was not able to be performed TTE with MV prolapse   Principal Problem:   MSSA bacteremia Active Problems:   Obstructive sleep apnea   Essential hypertension, benign   MVP (mitral valve prolapse)   ESRD on dialysis (HCC)   Anemia in chronic kidney disease   PAF (paroxysmal atrial fibrillation) (HCC)   Aspiration pneumonia (HCC)   Staphylococcal arthritis of right knee (HCC)   Staphylococcal arthritis of right wrist (HCC)   Staphylococcal arthritis of left shoulder (HCC)    Velmar Kilgus is a 72 y.o. male with  ith end-stage renal disease on hemodialysis with atrial fibrillation on anticoagulation who has been admitted with MSSA bacteremia.   He has multiple joints that are exquisitely tender to palpation with reduced range of motion and in the case of his knee and shoulder effusions.   Knee aspirate did only show 3 700 white blood cells with 90% neutrophils but cultures are growing Staphylococcus aureus--which in this setting would be highly unsusual to be a contaminant.   ? A sampling error that only led to 3700 wbc. It is not typical   It may be worth getting MRI of the knee as well to better define effusion and if other pathology here  MRI of the wrist not be performed in part due to body habitus and their inability to get the wrist in a good place in the scanner.  We have  worked with orthopedic surgery and a CT with contrast was ordered.  An MRI of the right shoulder is also been ordered since we had suspected infection there.  I think ultimately he is going to need surgery to all of the sites  There is strong suspicion for endocarditis as well.  Source control is imperative here  We will continue nafcillin  TEE and MRI of the brain will be needed along the a bit source control of the multiple joints that appear to be infected would be the higher priority item.  End-stage renal disease on hemodialysis.  Atrial fibrillation also with PVCs being followed by cardiology  I have personally spent 56 minutes involved in face-to-face and non-face-to-face activities for this patient on the day of the visit. Professional time spent includes the following activities: Preparing to see the patient (review of tests), Obtaining and/or reviewing separately obtained history (admission/discharge record), Performing a medically appropriate examination and/or evaluation , Ordering medications/tests/procedures, referring and communicating with other health care professionals, Documenting clinical information in the EMR, Independently interpreting results (not separately reported), Communicating results to the patient/family/caregiver, Counseling and educating the patient/family/caregiver and Care coordination (not separately reported).     LOS: 3 days   Acey Lav 04/04/2023, 2:48 PM\

## 2023-04-05 ENCOUNTER — Inpatient Hospital Stay (HOSPITAL_COMMUNITY): Payer: Medicare Other

## 2023-04-05 DIAGNOSIS — B9561 Methicillin susceptible Staphylococcus aureus infection as the cause of diseases classified elsewhere: Secondary | ICD-10-CM | POA: Diagnosis not present

## 2023-04-05 DIAGNOSIS — M00012 Staphylococcal arthritis, left shoulder: Secondary | ICD-10-CM | POA: Diagnosis not present

## 2023-04-05 DIAGNOSIS — M00031 Staphylococcal arthritis, right wrist: Secondary | ICD-10-CM | POA: Diagnosis not present

## 2023-04-05 DIAGNOSIS — R7881 Bacteremia: Secondary | ICD-10-CM | POA: Diagnosis not present

## 2023-04-05 DIAGNOSIS — J690 Pneumonitis due to inhalation of food and vomit: Secondary | ICD-10-CM | POA: Diagnosis not present

## 2023-04-05 DIAGNOSIS — M00061 Staphylococcal arthritis, right knee: Secondary | ICD-10-CM | POA: Diagnosis not present

## 2023-04-05 LAB — CBC WITH DIFFERENTIAL/PLATELET
Abs Immature Granulocytes: 0.3 10*3/uL — ABNORMAL HIGH (ref 0.00–0.07)
Basophils Absolute: 0 10*3/uL (ref 0.0–0.1)
Basophils Relative: 0 %
Eosinophils Absolute: 0.2 10*3/uL (ref 0.0–0.5)
Eosinophils Relative: 2 %
HCT: 35.4 % — ABNORMAL LOW (ref 39.0–52.0)
Hemoglobin: 12.4 g/dL — ABNORMAL LOW (ref 13.0–17.0)
Lymphocytes Relative: 17 %
Lymphs Abs: 1.8 10*3/uL (ref 0.7–4.0)
MCH: 33.4 pg (ref 26.0–34.0)
MCHC: 35 g/dL (ref 30.0–36.0)
MCV: 95.4 fL (ref 80.0–100.0)
Metamyelocytes Relative: 1 %
Monocytes Absolute: 0.7 10*3/uL (ref 0.1–1.0)
Monocytes Relative: 7 %
Myelocytes: 2 %
Neutro Abs: 7.3 10*3/uL (ref 1.7–7.7)
Neutrophils Relative %: 71 %
Platelets: 130 10*3/uL — ABNORMAL LOW (ref 150–400)
RBC: 3.71 MIL/uL — ABNORMAL LOW (ref 4.22–5.81)
RDW: 14.4 % (ref 11.5–15.5)
WBC: 10.3 10*3/uL (ref 4.0–10.5)
nRBC: 0 % (ref 0.0–0.2)
nRBC: 0 /100 WBC

## 2023-04-05 LAB — BODY FLUID CULTURE W GRAM STAIN

## 2023-04-05 LAB — SYNOVIAL CELL COUNT + DIFF, W/ CRYSTALS
Crystals, Fluid: NONE SEEN
Crystals, Fluid: NONE SEEN
Eosinophils-Synovial: 0 % (ref 0–1)
Eosinophils-Synovial: 0 % (ref 0–1)
Lymphocytes-Synovial Fld: 4 % (ref 0–20)
Lymphocytes-Synovial Fld: 3 % (ref 0–20)
Monocyte-Macrophage-Synovial Fluid: 14 % — ABNORMAL LOW (ref 50–90)
Monocyte-Macrophage-Synovial Fluid: 6 % — ABNORMAL LOW (ref 50–90)
Neutrophil, Synovial: 82 % — ABNORMAL HIGH (ref 0–25)
Neutrophil, Synovial: 91 % — ABNORMAL HIGH (ref 0–25)
WBC, Synovial: 18150 /mm3 — ABNORMAL HIGH (ref 0–200)
WBC, Synovial: 226950 /mm3 — ABNORMAL HIGH (ref 0–200)

## 2023-04-05 LAB — HEPARIN LEVEL (UNFRACTIONATED)
Heparin Unfractionated: 0.47 IU/mL (ref 0.30–0.70)
Heparin Unfractionated: 0.51 IU/mL (ref 0.30–0.70)

## 2023-04-05 LAB — C-REACTIVE PROTEIN: CRP: 26.4 mg/dL — ABNORMAL HIGH (ref <1.0)

## 2023-04-05 LAB — CULTURE, BLOOD (ROUTINE X 2): Culture: NO GROWTH

## 2023-04-05 LAB — BRAIN NATRIURETIC PEPTIDE: B Natriuretic Peptide: 142.3 pg/mL — ABNORMAL HIGH (ref 0.0–100.0)

## 2023-04-05 LAB — PROCALCITONIN: Procalcitonin: 23.82 ng/mL

## 2023-04-05 LAB — APTT
aPTT: 61 seconds — ABNORMAL HIGH (ref 24–36)
aPTT: 98 seconds — ABNORMAL HIGH (ref 24–36)

## 2023-04-05 LAB — AEROBIC/ANAEROBIC CULTURE W GRAM STAIN (SURGICAL/DEEP WOUND)

## 2023-04-05 MED ORDER — LIDOCAINE HCL (PF) 1 % IJ SOLN
5.0000 mL | Freq: Once | INTRAMUSCULAR | Status: AC
  Administered 2023-04-05: 5 mL via INTRADERMAL

## 2023-04-05 MED ORDER — IOHEXOL 180 MG/ML  SOLN
10.0000 mL | Freq: Once | INTRAMUSCULAR | Status: AC | PRN
  Administered 2023-04-05: 8 mL via INTRA_ARTICULAR

## 2023-04-05 MED ORDER — SODIUM CHLORIDE (PF) 0.9% IJ SOLUTION - NO CHARGE
10.0000 mL | Freq: Once | INTRAMUSCULAR | Status: AC
  Administered 2023-04-05: 10 mL

## 2023-04-05 NOTE — Progress Notes (Signed)
Subjective:  No new complaints  Antibiotics:  Anti-infectives (From admission, onward)    Start     Dose/Rate Route Frequency Ordered Stop   04/03/23 1200  nafcillin 2 g in sodium chloride 0.9 % 100 mL IVPB        2 g 216 mL/hr over 30 Minutes Intravenous Every 4 hours 04/03/23 1112     04/03/23 1145  nafcillin injection 2 g  Status:  Discontinued        2 g Intravenous Every 4 hours 04/03/23 1050 04/03/23 1112   04/02/23 1245  Ampicillin-Sulbactam (UNASYN) 3 g in sodium chloride 0.9 % 100 mL IVPB  Status:  Discontinued        3 g 200 mL/hr over 30 Minutes Intravenous Daily 04/02/23 1152 04/03/23 1050   04/01/23 2200  ceFAZolin (ANCEF) IVPB 1 g/50 mL premix  Status:  Discontinued        1 g 100 mL/hr over 30 Minutes Intravenous Every 24 hours 04/01/23 2039 04/02/23 1146   04/01/23 0830  cefTRIAXone (ROCEPHIN) 2 g in sodium chloride 0.9 % 100 mL IVPB  Status:  Discontinued        2 g 200 mL/hr over 30 Minutes Intravenous Every 24 hours 04/01/23 0823 04/01/23 2039   04/01/23 0830  azithromycin (ZITHROMAX) 500 mg in sodium chloride 0.9 % 250 mL IVPB  Status:  Discontinued        500 mg 250 mL/hr over 60 Minutes Intravenous Every 24 hours 04/01/23 0823 04/01/23 2039       Medications: Scheduled Meds:  (feeding supplement) PROSource Plus  30 mL Oral BID BM   atorvastatin  10 mg Oral QHS   bupivacaine(PF)  10 mL Infiltration Once   Chlorhexidine Gluconate Cloth  6 each Topical Daily   Chlorhexidine Gluconate Cloth  6 each Topical Q0600   cinacalcet  180 mg Oral Q breakfast   ferric citrate  630 mg Oral TID WC   metoprolol tartrate  12.5 mg Oral BID   multivitamin  1 tablet Oral QHS   pantoprazole (PROTONIX) IV  40 mg Intravenous Q12H   Continuous Infusions:  heparin 1,350 Units/hr (04/05/23 0602)   nafcillin IV 2 g (04/05/23 1201)   PRN Meds:.acetaminophen, diltiazem, lidocaine (PF), mouth rinse, oxyCODONE    Objective: Weight change:   Intake/Output  Summary (Last 24 hours) at 04/05/2023 1344 Last data filed at 04/05/2023 0020 Gross per 24 hour  Intake 588 ml  Output 1400 ml  Net -812 ml    Blood pressure 108/60, pulse 79, temperature (!) 97.1 F (36.2 C), temperature source Axillary, resp. rate 16, height 5\' 6"  (1.676 m), weight 97.4 kg, SpO2 95 %. Temp:  [97.1 F (36.2 C)-98.5 F (36.9 C)] 97.1 F (36.2 C) (07/06 0824) Pulse Rate:  [71-95] 79 (07/06 0824) Resp:  [0-22] 16 (07/06 0824) BP: (95-138)/(47-84) 108/60 (07/06 0824) SpO2:  [90 %-95 %] 95 % (07/06 0824) Weight:  [97.4 kg] 97.4 kg (07/05 1506)  Physical Exam: Physical Exam Constitutional:      Appearance: He is obese. He is ill-appearing.  Eyes:     Extraocular Movements: Extraocular movements intact.     Conjunctiva/sclera: Conjunctivae normal.  Cardiovascular:     Rate and Rhythm: Tachycardia present.  Pulmonary:     Effort: Pulmonary effort is normal. No respiratory distress.     Breath sounds: No wheezing or rales.  Abdominal:     General: There is no distension.  Musculoskeletal:  Right shoulder: Swelling and effusion present. Decreased range of motion.     Left shoulder: Swelling and effusion present. Decreased range of motion.     Right wrist: Swelling and tenderness present. Decreased range of motion.     Right knee: Swelling and effusion present. Decreased range of motion.  Skin:    General: Skin is warm.  Neurological:     General: No focal deficit present.     Mental Status: He is alert and oriented to person, place, and time.  Psychiatric:        Mood and Affect: Mood normal.        Speech: Speech is delayed.        Behavior: Behavior is cooperative.        Thought Content: Thought content normal.        Judgment: Judgment normal.      CBC:    BMET Recent Labs    04/03/23 2115 04/04/23 0441  NA 130* 127*  K 4.4 4.1  CL 87* 89*  CO2 25 24  GLUCOSE 143* 108*  BUN 68* 77*  CREATININE 7.58* 8.18*  CALCIUM 8.7* 8.2*       Liver Panel  Recent Labs    04/03/23 1058 04/04/23 0441  PROT 6.5 5.9*  ALBUMIN 1.9* 1.7*  AST 158* 230*  ALT 76* 92*  ALKPHOS 45 48  BILITOT 1.3* 3.4*  BILIDIR 0.4*  --   IBILI 0.9  --         Sedimentation Rate No results for input(s): "ESRSEDRATE" in the last 72 hours. C-Reactive Protein Recent Labs    04/04/23 0441 04/05/23 0315  CRP 40.7* 26.4*     Micro Results: Recent Results (from the past 720 hour(s))  Resp panel by RT-PCR (RSV, Flu A&B, Covid) Anterior Nasal Swab     Status: None   Collection Time: 04/01/23  8:23 AM   Specimen: Anterior Nasal Swab  Result Value Ref Range Status   SARS Coronavirus 2 by RT PCR NEGATIVE NEGATIVE Final   Influenza A by PCR NEGATIVE NEGATIVE Final   Influenza B by PCR NEGATIVE NEGATIVE Final    Comment: (NOTE) The Xpert Xpress SARS-CoV-2/FLU/RSV plus assay is intended as an aid in the diagnosis of influenza from Nasopharyngeal swab specimens and should not be used as a sole basis for treatment. Nasal washings and aspirates are unacceptable for Xpert Xpress SARS-CoV-2/FLU/RSV testing.  Fact Sheet for Patients: BloggerCourse.com  Fact Sheet for Healthcare Providers: SeriousBroker.it  This test is not yet approved or cleared by the [place] FDA and has been authorized for detection and/or diagnosis of SARS-CoV-2 by FDA under an Emergency Use Authorization (EUA). This EUA will remain in effect (meaning this test can be used) for the duration of the COVID-19 declaration under Section 564(b)(1) of the Act, 21 U.S.C. section 360bbb-3(b)(1), unless the authorization is terminated or revoked.     Resp Syncytial Virus by PCR NEGATIVE NEGATIVE Final    Comment: (NOTE) Fact Sheet for Patients: BloggerCourse.com  Fact Sheet for Healthcare Providers: SeriousBroker.it  This test is not yet approved or cleared by  the [place] FDA and has been authorized for detection and/or diagnosis of SARS-CoV-2 by FDA under an Emergency Use Authorization (EUA). This EUA will remain in effect (meaning this test can be used) for the duration of the COVID-19 declaration under Section 564(b)(1) of the Act, 21 U.S.C. section 360bbb-3(b)(1), unless the authorization is terminated or revoked.  Performed at [facility], [address] [place], [place] [address]   Blood Culture (routine x 2)     Status: Abnormal   Collection Time: 04/01/23  8:51 AM   Specimen: BLOOD LEFT FOREARM  Result Value Ref Range Status   Specimen Description BLOOD LEFT FOREARM  Final   Special Requests   Final    BOTTLES DRAWN AEROBIC AND ANAEROBIC Blood Culture results may not be optimal due to an excessive volume of blood received in culture bottles   Culture  Setup Time   Final    GRAM POSITIVE COCCI IN CLUSTERS IN BOTH AEROBIC AND ANAEROBIC BOTTLES CRITICAL RESULT CALLED TO, READ BACK BY AND VERIFIED WITH: PHARMD HAILEY VONDOLEN ON 04/01/23 @ 2017 BY DRT Performed at [facility], [address], [place], [place] [address]    Culture STAPHYLOCOCCUS AUREUS (A)  Final   Report Status 04/03/2023 FINAL  Final   Organism ID, Bacteria STAPHYLOCOCCUS AUREUS  Final      Susceptibility   Staphylococcus aureus - MIC*    CIPROFLOXACIN <=0.5 SENSITIVE Sensitive     ERYTHROMYCIN <=0.25 SENSITIVE Sensitive     GENTAMICIN <=0.5 SENSITIVE Sensitive     OXACILLIN <=0.25 SENSITIVE Sensitive     TETRACYCLINE <=1 SENSITIVE Sensitive     VANCOMYCIN 1 SENSITIVE Sensitive     TRIMETH/SULFA <=10 SENSITIVE Sensitive     CLINDAMYCIN <=0.25 SENSITIVE Sensitive     RIFAMPIN <=0.5 SENSITIVE Sensitive     Inducible Clindamycin NEGATIVE Sensitive     LINEZOLID 2 SENSITIVE Sensitive     * STAPHYLOCOCCUS AUREUS  Blood Culture ID Panel (Reflexed)     Status: Abnormal   Collection Time: 04/01/23  8:51 AM  Result Value Ref Range Status    Enterococcus faecalis NOT DETECTED NOT DETECTED Final   Enterococcus Faecium NOT DETECTED NOT DETECTED Final   Listeria monocytogenes NOT DETECTED NOT DETECTED Final   Staphylococcus species DETECTED (A) NOT DETECTED Final    Comment: CRITICAL RESULT CALLED TO, READ BACK BY AND VERIFIED WITH: PHARMD HAILEY VONDOLEN ON 04/01/23 @ 2017 BY DRT    Staphylococcus aureus (BCID) DETECTED (A) NOT DETECTED Final    Comment: CRITICAL RESULT CALLED TO, READ BACK BY AND VERIFIED WITH: PHARMD HAILEY VONDOLEN ON 04/01/23 @ 2017 BY DRT    Staphylococcus epidermidis NOT DETECTED NOT DETECTED Final   Staphylococcus lugdunensis NOT DETECTED NOT DETECTED Final   Streptococcus species NOT DETECTED NOT DETECTED Final   Streptococcus agalactiae NOT DETECTED NOT DETECTED Final   Streptococcus pneumoniae NOT DETECTED NOT DETECTED Final   Streptococcus pyogenes NOT DETECTED NOT DETECTED Final   A.calcoaceticus-baumannii NOT DETECTED NOT DETECTED Final   Bacteroides fragilis NOT DETECTED NOT DETECTED Final   Enterobacterales NOT DETECTED NOT DETECTED Final   Enterobacter cloacae complex NOT DETECTED NOT DETECTED Final   Escherichia coli NOT DETECTED NOT DETECTED Final   Klebsiella aerogenes NOT DETECTED NOT DETECTED Final   Klebsiella oxytoca NOT DETECTED NOT DETECTED Final   Klebsiella pneumoniae NOT DETECTED NOT DETECTED Final   Proteus species NOT DETECTED NOT DETECTED Final   Salmonella species NOT DETECTED NOT DETECTED Final   Serratia marcescens NOT DETECTED NOT DETECTED Final   Haemophilus influenzae NOT DETECTED NOT DETECTED Final   Neisseria meningitidis NOT DETECTED NOT DETECTED Final   Pseudomonas aeruginosa NOT DETECTED NOT DETECTED Final   Stenotrophomonas maltophilia NOT DETECTED NOT DETECTED Final   Candida albicans NOT DETECTED NOT DETECTED Final   Candida auris NOT DETECTED NOT DETECTED Final   Candida glabrata NOT DETECTED  NOT DETECTED Final   Candida krusei NOT DETECTED NOT DETECTED Final    Candida parapsilosis NOT DETECTED NOT DETECTED Final   Candida tropicalis NOT DETECTED NOT DETECTED Final   Cryptococcus neoformans/gattii NOT DETECTED NOT DETECTED Final   Meth resistant mecA/C and MREJ NOT DETECTED NOT DETECTED Final    Comment: Performed at [facility], [address] [place], [place] [address]  Blood Culture (routine x 2)     Status: None (Preliminary result)   Collection Time: 04/01/23  1:09 PM   Specimen: BLOOD LEFT HAND  Result Value Ref Range Status   Specimen Description BLOOD LEFT HAND  Final   Special Requests   Final    BOTTLES DRAWN AEROBIC AND ANAEROBIC Blood Culture adequate volume   Culture   Final    NO GROWTH 4 DAYS Performed at [facility], [address] [place], [place] [address]    Report Status PENDING  Incomplete  Culture, blood (Routine X 2) w Reflex to ID Panel     Status: None (Preliminary result)   Collection Time: 04/01/23 11:15 PM   Specimen: BLOOD  Result Value Ref Range Status   Specimen Description BLOOD BLOOD LEFT ARM  Final   Special Requests   Final    BOTTLES DRAWN AEROBIC AND ANAEROBIC Blood Culture results may not be optimal due to an inadequate volume of blood received in culture bottles   Culture   Final    NO GROWTH 4 DAYS Performed at [facility], [address], [place], [place] [address]    Report Status PENDING  Incomplete  Culture, blood (Routine X 2) w Reflex to ID Panel     Status: None (Preliminary result)   Collection Time: 04/01/23 11:21 PM   Specimen: BLOOD  Result Value Ref Range Status   Specimen Description BLOOD BLOOD LEFT HAND  Final   Special Requests   Final    BOTTLES DRAWN AEROBIC AND ANAEROBIC Blood Culture results may not be optimal due to an inadequate volume of blood received in culture bottles   Culture   Final    NO GROWTH 4 DAYS Performed at [facility], [address], [place], [place] [address]    Report Status PENDING  Incomplete  MRSA Next Gen by PCR, Nasal      Status: None   Collection Time: 04/02/23  5:42 AM   Specimen: Nasal Mucosa; Nasal Swab  Result Value Ref Range Status   MRSA by PCR Next Gen NOT DETECTED NOT DETECTED Final    Comment: (NOTE) The GeneXpert MRSA Assay (FDA approved for NASAL specimens only), is one component of a comprehensive MRSA colonization surveillance program. It is not intended to diagnose MRSA infection nor to guide or monitor treatment for MRSA infections. Test performance is not FDA approved in patients less than 64 years old. Performed at [facility], [address] [place], [place] [address]   Body fluid culture w Gram Stain     Status: None   Collection Time: 04/02/23  9:13 PM   Specimen: Body Fluid  Result Value Ref Range Status   Specimen Description FLUID SYNOVIAL RIGHT KNEE  Final   Special Requests NONE  Final   Gram Stain   Final    ABUNDANT WBC PRESENT, PREDOMINANTLY PMN RARE GRAM POSITIVE COCCI INTRACELLULAR Gram Stain Report Called to,Read Back By and Verified With: RN Loney Loh [phone] @ 2258 FH Performed at [facility], [address] [place], [place] [address]    Culture RARE STAPHYLOCOCCUS AUREUS  Final   Report Status 04/05/2023 FINAL  Final   Organism ID, Bacteria STAPHYLOCOCCUS AUREUS  Final      Susceptibility   Staphylococcus aureus - MIC*    CIPROFLOXACIN <=0.5 SENSITIVE Sensitive     ERYTHROMYCIN <=0.25 SENSITIVE Sensitive     GENTAMICIN <=0.5 SENSITIVE Sensitive     OXACILLIN <=0.25 SENSITIVE Sensitive     TETRACYCLINE <=1 SENSITIVE Sensitive     VANCOMYCIN 1 SENSITIVE Sensitive     TRIMETH/SULFA <=10 SENSITIVE Sensitive     CLINDAMYCIN <=0.25 SENSITIVE Sensitive     RIFAMPIN <=0.5 SENSITIVE Sensitive     Inducible Clindamycin NEGATIVE Sensitive     LINEZOLID 2 SENSITIVE Sensitive     * RARE STAPHYLOCOCCUS AUREUS  Culture, blood (Routine X 2) w Reflex to ID Panel     Status: None (Preliminary result)   Collection Time: 04/03/23  2:43 PM   Specimen: BLOOD  LEFT HAND  Result Value Ref Range Status   Specimen Description BLOOD LEFT HAND  Final   Special Requests   Final    BOTTLES DRAWN AEROBIC AND ANAEROBIC Blood Culture adequate volume   Culture   Final    NO GROWTH 2 DAYS Performed at [facility], [address], [place], [place] [address]    Report Status PENDING  Incomplete  Culture, blood (Routine X 2) w Reflex to ID Panel     Status: None (Preliminary result)   Collection Time: 04/03/23  2:47 PM   Specimen: BLOOD LEFT HAND  Result Value Ref Range Status   Specimen Description BLOOD LEFT HAND  Final   Special Requests   Final    BOTTLES DRAWN AEROBIC AND ANAEROBIC Blood Culture adequate volume   Culture   Final    NO GROWTH 2 DAYS Performed at [facility], [address] [place], [place] [address]    Report Status PENDING  Incomplete    Studies/Results: MR SHOULDER RIGHT WO CONTRAST  Result Date: 04/05/2023 CLINICAL DATA:  Septic arthritis suspected, shoulder, xray done EXAM: MRI OF THE RIGHT SHOULDER WITHOUT CONTRAST TECHNIQUE: Multiplanar, multisequence MR imaging of the shoulder was performed. No intravenous contrast was administered. COMPARISON:  None Available. FINDINGS: Rotator cuff: Complete full-thickness tears of the supraspinatus, infraspinatus, and subscapularis tendons. The supraspinatus and subscapularis tendons are retracted to the level of the glenohumeral joint. Intact teres minor. Muscles: Advanced rotator cuff muscle atrophy and fatty infiltration. Biceps long head:  Not visualized, torn and retracted. Acromioclavicular Joint: Moderate degenerative changes of the AC joint. Moderate volume subacromial-subdeltoid bursal fluid communicating with the glenohumeral joint. Glenohumeral Joint: Large glenohumeral joint effusion. Diffuse chondral thinning with multifocal areas of full-thickness fissuring along both sides of the joint. Labrum:  Diffusely degenerated. Bones: No acute fracture. No dislocation. No bone  marrow edema. No erosion is seen. No suspicious marrow replacing bone lesion. Other: None. IMPRESSION: 1. Large glenohumeral joint effusion, nonspecific. Arthrocentesis with joint fluid analysis is recommended to exclude the possibility of septic arthritis. 2. Complete full-thickness tears of the supraspinatus, infraspinatus, and subscapularis tendons with advanced rotator cuff muscle atrophy and fatty infiltration. 3. Torn and retracted long head of the biceps tendon. 4. Moderate glenohumeral and AC joint osteoarthritis. Electronically Signed   By: Duanne Guess D.O.   On: 04/05/2023 09:11   DG Wrist Complete Right  Result Date: 04/04/2023 CLINICAL DATA:  Recent fall with right wrist pain, initial encounter EXAM: RIGHT  WRIST - COMPLETE 3+ VIEW COMPARISON:  CT of the wrist obtained on the same day. 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 No acute fracture or dislocation is noted. Vascular calcifications are noted as well as mild soft tissue swelling. No acute abnormality seen. IMPRESSION: No acute abnormality noted. Electronically Signed   By: Alcide Clever M.D.   On: 04/04/2023 19:11   CT WRIST RIGHT W CONTRAST  Result Date: 04/04/2023 CLINICAL DATA:  Evaluate for septic joint/effusion. Unable to tolerate MRI. EXAM: CT OF THE UPPER RIGHT EXTREMITY WITH CONTRAST TECHNIQUE: Multidetector CT imaging of the upper right extremity was performed according to the standard protocol following intravenous contrast administration. RADIATION DOSE REDUCTION: This exam was performed according to the departmental dose-optimization program which includes automated exposure control, adjustment of the mA and/or kV according to patient size and/or use of iterative reconstruction technique. CONTRAST:  75mL OMNIPAQUE IOHEXOL 350 MG/ML SOLN COMPARISON:  None Available. FINDINGS: Patient scanned with arms down positioning which limits detailed osseous and soft tissue assessment due to soft tissue  attenuation from habitus. Bones/Joint/Cartilage No fracture. Possible remote triquetral fracture. No erosive change. There may be a small distal radioulnar joint effusion, series 5, image 62. Suspected small radiocarpal joint effusion sagittal series 11, image 66. This is difficult to accurately assess on the current exam. Ligaments Suboptimally assessed by CT. Muscles and Tendons No obvious intramuscular collection. Limited assessment for tenosynovial fluid. Soft tissues No soft tissue subcutaneous collection. Mild edema is noted about the ulnar aspect of the wrist and distal forearm. Incidental note of edema in the subcutaneous tissues about the right flank, partially included in this field of view. IMPRESSION: 1. Patient scanned with arms down positioning which limits detailed osseous and soft tissue assessment due to soft tissue attenuation from habitus. 2. There may be a small distal radioulnar joint effusion. Suspected small radiocarpal joint effusion. This is difficult to accurately assess on the current exam. No gross CT findings of osteomyelitis. 3. Mild edema about the ulnar aspect of the wrist and distal forearm. No soft tissue collection. Electronically Signed   By: Narda Rutherford M.D.   On: 04/04/2023 17:06   ECHOCARDIOGRAM COMPLETE  Result Date: 04/03/2023    ECHOCARDIOGRAM REPORT   Patient Name:   Peter [name] Date of Exam: 04/03/2023 Medical Rec #:  [id number]         Height:       66.0 in Accession #:    [id number]        Weight:       216.0 lb Date of Birth:  December 20, 1950         BSA:          2.067 m Patient Age:    72 years          BP:           106/33 mmHg Patient Gender: M                 HR:           110 bpm. Exam Location:  Inpatient Procedure: 2D Echo, Cardiac Doppler and Color Doppler Indications:    Bacteremia R78.81  History:        Patient has prior history of Echocardiogram examinations, most                 recent 05/30/2018. CHF, Arrythmias:Atrial Fibrillation and  Bradycardia, Signs/Symptoms:Shortness of Breath and Syncope;                 Risk Factors:Hypertension, Sleep Apnea and Diabetes. ESRD,                 Arteriovenous fistula.  Sonographer:    Lucendia Herrlich Referring Phys: [id number] N [place] IMPRESSIONS  1. Left ventricular ejection fraction, by estimation, is 60 to 65%. The left ventricle has normal function. The left ventricle has no regional wall motion abnormalities. There is severe left ventricular hypertrophy. Left ventricular diastolic parameters  are indeterminate.  2. Right ventricular systolic function is normal. The right ventricular size is normal.  3. Left atrial size was moderately dilated.  4. Mild appearing MR but some splay artifact cannot r/o some posterior leaflet prolapse in addition to MAC with anteriorly directed MR Given bacteremia and abnormal posterior mitral annulus suggest TEE to further assess if clnically indicated. The mitral valve is degenerative. Mild mitral valve regurgitation. No evidence of mitral stenosis. Moderate mitral annular calcification.  5. The aortic valve is tricuspid. There is moderate calcification of the aortic valve. There is moderate thickening of the aortic valve. Aortic valve regurgitation is not visualized. Mild aortic valve stenosis.  6. The inferior vena cava is dilated in size with >50% respiratory variability, suggesting right atrial pressure of 8 mmHg. FINDINGS  Left Ventricle: Left ventricular ejection fraction, by estimation, is 60 to 65%. The left ventricle has normal function. The left ventricle has no regional wall motion abnormalities. The left ventricular internal cavity size was normal in size. There is  severe left ventricular hypertrophy. Left ventricular diastolic parameters are indeterminate. Right Ventricle: The right ventricular size is normal. No increase in right ventricular wall thickness. Right ventricular systolic function is normal. Left Atrium: Left atrial size was moderately  dilated. Right Atrium: Right atrial size was normal in size. Pericardium: There is no evidence of pericardial effusion. Mitral Valve: Mild appearing MR but some splay artifact cannot r/o some posterior leaflet prolapse in addition to MAC with anteriorly directed MR Given bacteremia and abnormal posterior mitral annulus suggest TEE to further assess if clnically indicated.  The mitral valve is degenerative in appearance. There is moderate thickening of the mitral valve leaflet(s). There is moderate calcification of the mitral valve leaflet(s). Moderate mitral annular calcification. Mild mitral valve regurgitation. No evidence of mitral valve stenosis. Tricuspid Valve: The tricuspid valve is normal in structure. Tricuspid valve regurgitation is mild . No evidence of tricuspid stenosis. Aortic Valve: The aortic valve is tricuspid. There is moderate calcification of the aortic valve. There is moderate thickening of the aortic valve. Aortic valve regurgitation is not visualized. Mild aortic stenosis is present. Aortic valve mean gradient measures 9.0 mmHg. Aortic valve peak gradient measures 16.6 mmHg. Aortic valve area, by VTI measures 1.54 cm. Pulmonic Valve: The pulmonic valve was normal in structure. Pulmonic valve regurgitation is not visualized. No evidence of pulmonic stenosis. Aorta: The aortic root is normal in size and structure. Venous: The inferior vena cava is dilated in size with greater than 50% respiratory variability, suggesting right atrial pressure of 8 mmHg. IAS/Shunts: No atrial level shunt detected by color flow Doppler.  LEFT VENTRICLE PLAX 2D LVIDd:         4.00 cm   Diastology LVIDs:         2.60 cm   LV e' lateral:   13.40 cm/s LV PW:         1.50 cm  LV E/e' lateral: 8.8 LV IVS:        1.80 cm LVOT diam:     2.30 cm LV SV:         51 LV SV Index:   24 LVOT Area:     4.15 cm  RIGHT VENTRICLE             IVC RV S prime:     15.20 cm/s  IVC diam: 2.10 cm TAPSE (M-mode): 1.9 cm LEFT ATRIUM             Index        RIGHT ATRIUM           Index LA diam:      4.50 cm  2.18 cm/m   RA Area:     19.40 cm LA Vol (A2C): 104.0 ml 50.33 ml/m  RA Volume:   46.80 ml  22.65 ml/m LA Vol (A4C): 128.0 ml 61.94 ml/m  AORTIC VALVE AV Area (Vmax):    1.77 cm AV Area (Vmean):   1.67 cm AV Area (VTI):     1.54 cm AV Vmax:           204.00 cm/s AV Vmean:          137.000 cm/s AV VTI:            0.329 m AV Peak Grad:      16.6 mmHg AV Mean Grad:      9.0 mmHg LVOT Vmax:         87.00 cm/s LVOT Vmean:        55.067 cm/s LVOT VTI:          0.122 m LVOT/AV VTI ratio: 0.37  AORTA Ao Root diam: 3.70 cm Ao Asc diam:  3.90 cm MITRAL VALVE                TRICUSPID VALVE MV Area (PHT): 4.15 cm     TR Peak grad:   30.7 mmHg MV Decel Time: 183 msec     TR Vmax:        277.00 cm/s MR Peak grad: 31.4 mmHg MR Vmax:      280.00 cm/s   SHUNTS MV E velocity: 118.00 cm/s  Systemic VTI:  0.12 m MV A velocity: 45.30 cm/s   Systemic Diam: 2.30 cm MV E/A ratio:  2.60 Charlton Haws MD Electronically signed by Charlton Haws MD Signature Date/Time: 04/03/2023/5:07:21 PM    Final       Assessment/Plan:  INTERVAL HISTORY:  MRI of right shoulder also site shows a large effusion  Principal Problem:   MSSA bacteremia Active Problems:   Obstructive sleep apnea   Essential hypertension, benign   MVP (mitral valve prolapse)   ESRD on dialysis (HCC)   Anemia in chronic kidney disease   PAF (paroxysmal atrial fibrillation) (HCC)   Aspiration pneumonia (HCC)   Staphylococcal arthritis of right knee (HCC)   Staphylococcal arthritis of right wrist (HCC)   Staphylococcal arthritis of left shoulder (HCC)   Staphylococcal arthritis of right shoulder (HCC)    Peter [name] is a 72 y.o. male with  ith end-stage renal disease on hemodialysis with atrial fibrillation on anticoagulation who has been admitted with MSSA bacteremia.   He has multiple joints that are exquisitely tender to palpation with reduced range of motion and in the case  of his knee and shoulder effusions.   Knee aspirate did only show 3 700 white blood cells with 90% neutrophils but cultures  are growing Staphylococcus aureus--which in this setting would be highly unsusual to be a contaminant.   ? A sampling error that only led to 3700 wbc. It is not typical   It may be worth getting MRI of the knee as well to better define effusion and if other pathology here  MRI of the wrist not be performed in part due to body habitus and their inability to get the wrist in a good place in the scanner.  We have worked with orthopedic surgery and a CT with contrast was ordered whic did not show clear cut effusion  An MRI of the right shoulder also shows a large effusion  I think ultimately he is going to need surgery to all of the sites  There is strong suspicion for endocarditis as well.  Source control is imperative here  We will continue nafcillin  TEE and MRI of the brain will be needed along the a bit source control of the multiple joints that appear to be infected would be the higher priority item.  End-stage renal disease on hemodialysis.  Atrial fibrillation also with PVCs being followed by cardiology  I have personally spent 50 minutes involved in face-to-face and non-face-to-face activities for this patient on the day of the visit. Professional time spent includes the following activities: Preparing to see the patient (review of tests), Obtaining and/or reviewing separately obtained history (admission/discharge record), Performing a medically appropriate examination and/or evaluation , Ordering medications/tests/procedures, referring and communicating with other health care professionals, Documenting clinical information in the EMR, Independently interpreting results (not separately reported), Communicating results to the patient/family/caregiver, Counseling and educating the patient/family/caregiver and Care coordination (not separately reported).       LOS: 4 days   Peter [name] 04/05/2023, 1:44 PM\

## 2023-04-05 NOTE — Progress Notes (Signed)
ANTICOAGULATION CONSULT NOTE - follow-up  Pharmacy Consult for heparin Indication: atrial fibrillation  Allergies  Allergen Reactions   Doxazosin Mesylate     ineffective for BP (10/2014)   Triamterene-Hctz     increased creatinine (see 09/2014 office note)    Patient Measurements: Height: 5\' 6"  (167.6 cm) Weight: 97.4 kg (214 lb 11.7 oz) IBW/kg (Calculated) : 63.8 Heparin Dosing Weight: 85.6 kg  Vital Signs: Temp: 98.2 F (36.8 C) (07/06 0000) Temp Source: Axillary (07/06 0000) BP: 104/67 (07/06 0000) Pulse Rate: 84 (07/06 0000)  Labs: Recent Labs    04/02/23 0555 04/03/23 0140 04/03/23 0140 04/03/23 0730 04/03/23 2115 04/04/23 0441 04/05/23 0315  HGB  --  12.1*   < >  --   --  11.7* 12.4*  HCT  --  35.2*  --   --   --  33.5* 35.4*  PLT  --  87*  --   --   --  98* 130*  APTT  --  71*   < > 71*  --  78* 61*  HEPARINUNFRC  --  >1.10*  --   --   --  >1.10* 0.47  CREATININE 7.96*  --   --   --  7.58* 8.18*  --    < > = values in this interval not displayed.     Estimated Creatinine Clearance: 8.7 mL/min (A) (by C-G formula based on SCr of 8.18 mg/dL (H)).   Medical History: Past Medical History:  Diagnosis Date   Anemia in chronic kidney disease (CKD)    CKD (chronic kidney disease) stage 5, GFR less than 15 ml/min (HCC)    M-W-F   Diabetes mellitus without complication (HCC) 03/2012   Hx type II - no meds, diet controlled, lost weight   ED (erectile dysfunction)    Heart murmur    not significant   History of kidney stones    passed 1 , 1 was removed with kidney disease in 2010   Hypercholesteremia    Hypertension    Iron deficiency    Kidney stone 2010   MVP (mitral valve prolapse)    Posterior with MR by ECHO   OSA (obstructive sleep apnea)    does not use cpap, lost weight    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   Syncope     Medications:  Medications Prior to Admission  Medication Sig Dispense Refill Last Dose    acetaminophen (TYLENOL) 500 MG tablet Take 1,000 mg by mouth as needed for mild pain or moderate pain.   Past Week   apixaban (ELIQUIS) 5 MG TABS tablet Take 1 tablet (5 mg total) by mouth 2 (two) times daily. 60 tablet 11 Past Week at 22:00   atorvastatin (LIPITOR) 10 MG tablet Take 10 mg by mouth at bedtime.   Past Week   AURYXIA 1 GM 210 MG(Fe) tablet Take 420-630 mg by mouth See admin instructions. Take 630mg  by mouth three times daily with meals and take 420mg  by mouth with snacks.   Past Week   VITAMIN D, CHOLECALCIFEROL, PO Take 1 tablet by mouth once a week. Patient had Dialysis on Monday, Wednesday, and Friday.      Scheduled:   (feeding supplement) PROSource Plus  30 mL Oral BID BM   atorvastatin  10 mg Oral QHS   bupivacaine(PF)  10 mL Infiltration Once   Chlorhexidine Gluconate Cloth  6 each Topical Daily   Chlorhexidine Gluconate Cloth  6 each  Topical Q0600   cinacalcet  180 mg Oral Q breakfast   ferric citrate  630 mg Oral TID WC   metoprolol tartrate  12.5 mg Oral BID   multivitamin  1 tablet Oral QHS   pantoprazole (PROTONIX) IV  40 mg Intravenous Q12H    Assessment: 72 YOM w/ prior history of AF requiring heparin gtt. Last dose of apixaban given ~10 AM on 04/02/2023  Heparin level 0.47 and aPTT 61 level still not correlating- no issues with infusion per RN and no overt s/sx of bleeding to report. CBC stable.    Goal of Therapy:  Heparin level 0.3-0.7 units/ml aPTT 66-102 seconds Monitor platelets by anticoagulation protocol: Yes   Plan:  Increase heparin infusion to 1350 units/hr Check daily (aPTT / HL) while on heparin May stop aPTT when HL correlates Continue to monitor H&H and platelets  Ruben Im, PharmD Clinical Pharmacist 04/05/2023 5:42 AM Please check AMION for all [facility] Pharmacy numbers

## 2023-04-05 NOTE — Progress Notes (Addendum)
PROGRESS NOTE                                                                                                                                                                                                             Patient Demographics:    Peter [name], is a 72 y.o. male, DOB - 05-06-1951, ONG:[id number]  Outpatient Primary MD for the patient is [name], MD    LOS - 4  Admit date - 04/01/2023    Chief Complaint  Patient presents with   Fever   Emesis       Brief Narrative (HPI from H&P)   72 y.o. male with medical history significant of ESRD on HD MWF, Type 2 DM  diet controlled,  anemia of CKD, hypertension, paroxysmal atrial fibrillation on Eliquis, hyperlipidemia, was brought in for generalized weakness, apparently patient was doing fine up till 2 days ago, sometime on Sunday he started developing nausea vomiting generalized weakness, had a fall, low-grade fever, subsequently brought to the ER where he was found to be septic and admitted to the hospital.   Subjective:   Patient in bed, appears comfortable, denies any headache, no fever, no chest pain or pressure, no shortness of breath , no abdominal pain. No new focal weakness.   Assessment  & Plan :    Sepsis present on admission with Staph aureus bacteremia 1 out of 2, nausea vomiting and syncope at home.  No clear source of infection.  ID to see the patient shortly, source of infection not clear, for now cover with Ance[facility], stable CT scan abdomen pelvis nausea vomiting per [facility] likely due to his infection and sepsis, ID following, underwent MRI C-spine, left shoulder with nonspecific findings, echocardiogram with nonspecific changes and mitral valve, blood cultures growing MSSA, arthrocentesis of right knee prelim cultures also likely growing Staph aureus, has multiple joint pains and aches appears toxic clinically I suspect him to have endocarditis.   Currently I have consulted ID, PCCM as he still appears tenuous on a daily basis, also consulted cardiology, I have also requested TEE which will be done as soon as we can schedule it.   Aspiration pneumonia.  Antibiotics per ID currently on nafcillin only.  Changes being made by ID based on their assessment.  ESRD.  On MWF schedule.  Nephrology on board, Right arm  AV graft.  Fall without syncope.  Has soft tissue injurie, multiple joint pains including C-spine discomfort, bilateral shoulder pains, right knee and right wrist pain.  Orthopedics on board.  Underwent right knee arthrocentesis with prelim cultures growing MSSA, right shoulder aspiration also being considered by orthopedics.  Right knee pain, bilateral shoulder discomfort, right wrist and neck pain.  X - Ray nonacute, orthopedic following, kindly see above.  GERD.  IV PPI.  Paroxysmal atrial fibrillation [place] vas 2 score of 3.  Switch Eliquis to heparin till clinical situation stabilizes, placed on low-dose Cardizem p.o. and as needed IV.  Toxic encephalopathy due to sepsis.  Stable but overall still tenuous, minimal improvement.  Mild asymptomatic transaminitis.  Negative of acute hepatitis panel, likely shock liver, trend stable, GI on board,.  CT abdomen pelvis nonacute.  Bilirubin stable.       Condition - Extremely Guarded  Family Communication  : Family friend, [name] [phone] at bedside on 04/02/2023, of note patient has confirmed to me multiple times that he wants [name] to be the decision maker for him.  Code Status : Full code  Consults  : Nephrology, GI, ID, Ortho, PCCM, cardiology, palliative care  PUD Prophylaxis :  PPI   Procedures  :     TTE  MRI left shoulder and C-spine.    Echocardiogram.   1. Left ventricular ejection fraction, by estimation, is 60 to 65%. The left ventricle has normal function. The left ventricle has no regional wall motion abnormalities. There is severe left ventricular  hypertrophy. Left ventricular diastolic parameters  are indeterminate.  2. Right ventricular systolic function is normal. The right ventricular size is normal.  3. Left atrial size was moderately dilated.  4. Mild appearing MR but some splay artifact cannot r/o some posterior leaflet prolapse in addition to MAC with anteriorly directed MR Given bacteremia and abnormal posterior mitral annulus suggest TEE to further assess if clnically indicated. The mitral valve is degenerative. Mild mitral valve regurgitation. No evidence of mitral stenosis. Moderate mitral annular calcification.  5. The aortic valve is tricuspid. There is moderate calcification of the aortic valve. There is moderate thickening of the aortic valve. Aortic valve regurgitation is not visualized. Mild aortic valve stenosis.  6. The inferior vena cava is dilated in size with >50% respiratory variability, suggesting right atrial pressure of 8 mmHg.  CT scan head and C-spine.  1.  No evidence of an acute intracranial abnormality. 2. Mild generalized parenchymal atrophy. 3. Paranasal sinus disease as described. CT cervical spine: 1. No evidence of an acute cervical spine fracture. 2. Cervical spondylosis as outlined. Notably at C2-C3 and C3-C4, central disc protrusions contribute to at least moderate spinal canal stenosis. Multilevel bony neural from narrowing.   Lower extremity bilateral venous duplex.  No DVT.    Right knee arthrocentesis.  5000 WBCs predominantly neutrophils, cultures pending.    CT abdomen pelvis - 1. No acute finding by CT. 2. Atrophic kidneys, more so on the left than the right. 3. Diverticulosis without evidence of diverticulitis. 4. Cardiomegaly and coronary artery calcification. 5. Mild dependent atelectasis at the lung bases.      Disposition Plan  :    Status is: Inpatient   DVT Prophylaxis  :  Hep gtt >> Eliquis  Place and maintain sequential compression device Start: 04/02/23 1116    Lab Results   Component Value Date   PLT 130 (L) 04/05/2023    Diet :  Diet Order  Diet NPO time specified Except for: Sips with Meds  Diet effective midnight                    Inpatient Medications  Scheduled Meds:  (feeding supplement) PROSource Plus  30 mL Oral BID BM   atorvastatin  10 mg Oral QHS   bupivacaine(PF)  10 mL Infiltration Once   Chlorhexidine Gluconate Cloth  6 each Topical Daily   Chlorhexidine Gluconate Cloth  6 each Topical Q0600   cinacalcet  180 mg Oral Q breakfast   ferric citrate  630 mg Oral TID WC   metoprolol tartrate  12.5 mg Oral BID   multivitamin  1 tablet Oral QHS   pantoprazole (PROTONIX) IV  40 mg Intravenous Q12H   Continuous Infusions:  heparin 1,350 Units/hr (04/05/23 0602)   nafcillin IV 2 g (04/05/23 0814)   PRN Meds:.acetaminophen, diltiazem, lidocaine (PF), mouth rinse, oxyCODONE    Objective:   Vitals:   04/04/23 2200 04/05/23 0000 04/05/23 0400 04/05/23 0824  BP:  104/67 109/69 108/60  Pulse: 84 84 73 79  Resp: 19 16  16   Temp:  98.2 F (36.8 C) 98 F (36.7 C)   TempSrc:  Axillary Oral   SpO2: 90% 93% 92% 95%  Weight:      Height:        Wt Readings from Last 3 Encounters:  04/04/23 97.4 kg  03/20/23 99.4 kg  02/26/23 96.8 kg     Intake/Output Summary (Last 24 hours) at 04/05/2023 0835 Last data filed at 04/05/2023 0020 Gross per 24 hour  Intake 688 ml  Output 1400 ml  Net -712 ml     Physical Exam  Awake Alert but appears lethargic, No new F.N deficits, Normal affect [place].AT,PERRAL Supple Neck, No JVD,   Symmetrical Chest wall movement, Good air movement bilaterally, CTAB RRR,No Gallops,Rubs or new Murmurs,  +ve B.Sounds, Abd Soft, No tenderness,   Diffuse tenderness in right knee, left shoulder, right wrist, now right shoulder as well long with C-spine tenderness        Data Review:    Recent Labs  Lab 04/01/23 0851 04/02/23 0340 04/03/23 0140 04/04/23 0441 04/05/23 0315  WBC 13.8* 9.0 8.8  8.8 10.3  HGB 13.8 12.9* 12.1* 11.7* 12.4*  HCT 40.6 37.0* 35.2* 33.5* 35.4*  PLT 85* 77* 87* 98* 130*  MCV 98.8 97.4 97.2 96.3 95.4  MCH 33.6 33.9 33.4 33.6 33.4  MCHC 34.0 34.9 34.4 34.9 35.0  RDW 14.2 14.4 14.3 14.2 14.4  LYMPHSABS 0.6* 0.5* 0.7 1.0 1.8  MONOABS 0.9 0.8 0.8 0.7 0.7  EOSABS 0.0 0.1 0.0 0.1 0.2  BASOSABS 0.0 0.1 0.0 0.0 0.0    Recent Labs  Lab 04/01/23 0851 04/01/23 1447 04/01/23 1448 04/01/23 2315 04/02/23 0340 04/02/23 0555 04/02/23 0556 04/03/23 0140 04/03/23 1058 04/03/23 2115 04/04/23 0441 04/05/23 0315  NA 129* 127*  --   --   --  131*  --   --   --  130* 127*  --   K 5.3* 5.6*  --   --   --  5.2*  --   --   --  4.4 4.1  --   CL 91* 93*  --   --   --  90*  --   --   --  87* 89*  --   CO2 18* 14*  --   --   --  21*  --   --   --  25  24  --   ANIONGAP 20* 20*  --   --   --  20*  --   --   --  18* 14  --   GLUCOSE 136* 125*  --   --   --  117*  --   --   --  143* 108*  --   BUN 102* 104*  --   --   --  58*  --   --   --  68* 77*  --   CREATININE 13.88* 13.58*  --   --   --  7.96*  --   --   --  7.58* 8.18*  --   AST 147* 172*  --   --   --   --   --   --  158*  --  230*  --   ALT 56* 65*  --   --   --   --   --   --  76*  --  92*  --   ALKPHOS 47 39  --   --   --   --   --   --  45  --  48  --   BILITOT 1.1 1.0  --   --   --   --   --   --  1.3*  --  3.4*  --   ALBUMIN 2.8* 2.5*  --   --   --  2.3*  --   --  1.9*  --  1.7*  --   CRP  --   --   --   --   --   --  43.0* 43.8*  --   --  40.7* 26.4*  PROCALCITON  --   --   --   --   --   --  37.09 38.83  --   --  32.52 23.82  LATICACIDVEN 4.6*  --  4.0* 3.9*  --   --   --   --   --   --   --   --   INR 1.6*  --   --   --   --   --   --   --   --   --   --   --   AMMONIA  --   --   --   --  28  --   --   --  20  --   --   --   BNP  --   --   --   --   --   --   --  136.6*  --   --  112.2* 142.3*  MG  --   --   --   --   --   --   --   --   --  2.2 2.3  --   CALCIUM 9.8 9.1  --   --   --  9.1  --   --    --  8.7* 8.2*  --       Recent Labs  Lab 04/01/23 0851 04/01/23 1447 04/01/23 1448 04/01/23 2315 04/02/23 0340 04/02/23 0555 04/02/23 0556 04/03/23 0140 04/03/23 1058 04/03/23 2115 04/04/23 0441 04/05/23 0315  CRP  --   --   --   --   --   --  43.0* 43.8*  --   --  40.7* 26.4*  PROCALCITON  --   --   --   --   --   --  37.09 38.83  --   --  32.52 23.82  LATICACIDVEN 4.6*  --  4.0* 3.9*  --   --   --   --   --   --   --   --   INR 1.6*  --   --   --   --   --   --   --   --   --   --   --   AMMONIA  --   --   --   --  28  --   --   --  20  --   --   --   BNP  --   --   --   --   --   --   --  136.6*  --   --  112.2* 142.3*  MG  --   --   --   --   --   --   --   --   --  2.2 2.3  --   CALCIUM 9.8 9.1  --   --   --  9.1  --   --   --  8.7* 8.2*  --     Radiology Reports DG Wrist Complete Right  Result Date: 04/04/2023 CLINICAL DATA:  Recent fall with right wrist pain, initial encounter EXAM: RIGHT WRIST - COMPLETE 3+ VIEW COMPARISON:  CT of the wrist obtained on the same day. 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 No acute fracture or dislocation is noted. Vascular calcifications are noted as well as mild soft tissue swelling. No acute abnormality seen. IMPRESSION: No acute abnormality noted. Electronically Signed   By: Alcide Clever M.D.   On: 04/04/2023 19:11   CT WRIST RIGHT W CONTRAST  Result Date: 04/04/2023 CLINICAL DATA:  Evaluate for septic joint/effusion. Unable to tolerate MRI. EXAM: CT OF THE UPPER RIGHT EXTREMITY WITH CONTRAST TECHNIQUE: Multidetector CT imaging of the upper right extremity was performed according to the standard protocol following intravenous contrast administration. RADIATION DOSE REDUCTION: This exam was performed according to the departmental dose-optimization program which includes automated exposure control, adjustment of the mA and/or kV according to patient size and/or use of iterative reconstruction technique.  CONTRAST:  75mL OMNIPAQUE IOHEXOL 350 MG/ML SOLN COMPARISON:  None Available. FINDINGS: Patient scanned with arms down positioning which limits detailed osseous and soft tissue assessment due to soft tissue attenuation from habitus. Bones/Joint/Cartilage No fracture. Possible remote triquetral fracture. No erosive change. There may be a small distal radioulnar joint effusion, series 5, image 62. Suspected small radiocarpal joint effusion sagittal series 11, image 66. This is difficult to accurately assess on the current exam. Ligaments Suboptimally assessed by CT. Muscles and Tendons No obvious intramuscular collection. Limited assessment for tenosynovial fluid. Soft tissues No soft tissue subcutaneous collection. Mild edema is noted about the ulnar aspect of the wrist and distal forearm. Incidental note of edema in the subcutaneous tissues about the right flank, partially included in this field of view. IMPRESSION: 1. Patient scanned with arms down positioning which limits detailed osseous and soft tissue assessment due to soft tissue attenuation from habitus. 2. There may be a small distal radioulnar joint effusion. Suspected small radiocarpal joint effusion. This is difficult to accurately assess on the current exam. No gross CT findings of osteomyelitis. 3. Mild edema about the ulnar aspect of the wrist and distal forearm. No soft tissue collection. Electronically Signed   By: Ivette Loyal.D.  On: 04/04/2023 17:06   ECHOCARDIOGRAM COMPLETE  Result Date: 04/03/2023    ECHOCARDIOGRAM REPORT   Patient Name:   TJADEN DAVID Bayles Date of Exam: 04/03/2023 Medical Rec #:  [id number]         Height:       66.0 in Accession #:    [id number]        Weight:       216.0 lb Date of Birth:  27-Apr-1951         BSA:          2.067 m Patient Age:    72 years          BP:           106/33 mmHg Patient Gender: M                 HR:           110 bpm. Exam Location:  Inpatient Procedure: 2D Echo, Cardiac Doppler and Color  Doppler Indications:    Bacteremia R78.81  History:        Patient has prior history of Echocardiogram examinations, most                 recent 05/30/2018. CHF, Arrythmias:Atrial Fibrillation and                 Bradycardia, Signs/Symptoms:Shortness of Breath and Syncope;                 Risk Factors:Hypertension, Sleep Apnea and Diabetes. ESRD,                 Arteriovenous fistula.  Sonographer:    Lucendia Herrlich Referring Phys: [id number] CORNELIUS N VAN DAM IMPRESSIONS  1. Left ventricular ejection fraction, by estimation, is 60 to 65%. The left ventricle has normal function. The left ventricle has no regional wall motion abnormalities. There is severe left ventricular hypertrophy. Left ventricular diastolic parameters  are indeterminate.  2. Right ventricular systolic function is normal. The right ventricular size is normal.  3. Left atrial size was moderately dilated.  4. Mild appearing MR but some splay artifact cannot r/o some posterior leaflet prolapse in addition to MAC with anteriorly directed MR Given bacteremia and abnormal posterior mitral annulus suggest TEE to further assess if clnically indicated. The mitral valve is degenerative. Mild mitral valve regurgitation. No evidence of mitral stenosis. Moderate mitral annular calcification.  5. The aortic valve is tricuspid. There is moderate calcification of the aortic valve. There is moderate thickening of the aortic valve. Aortic valve regurgitation is not visualized. Mild aortic valve stenosis.  6. The inferior vena cava is dilated in size with >50% respiratory variability, suggesting right atrial pressure of 8 mmHg. FINDINGS  Left Ventricle: Left ventricular ejection fraction, by estimation, is 60 to 65%. The left ventricle has normal function. The left ventricle has no regional wall motion abnormalities. The left ventricular internal cavity size was normal in size. There is  severe left ventricular hypertrophy. Left ventricular diastolic parameters are  indeterminate. Right Ventricle: The right ventricular size is normal. No increase in right ventricular wall thickness. Right ventricular systolic function is normal. Left Atrium: Left atrial size was moderately dilated. Right Atrium: Right atrial size was normal in size. Pericardium: There is no evidence of pericardial effusion. Mitral Valve: Mild appearing MR but some splay artifact cannot r/o some posterior leaflet prolapse in addition to MAC with anteriorly directed MR Given bacteremia and abnormal posterior mitral annulus suggest TEE to further  assess if clnically indicated.  The mitral valve is degenerative in appearance. There is moderate thickening of the mitral valve leaflet(s). There is moderate calcification of the mitral valve leaflet(s). Moderate mitral annular calcification. Mild mitral valve regurgitation. No evidence of mitral valve stenosis. Tricuspid Valve: The tricuspid valve is normal in structure. Tricuspid valve regurgitation is mild . No evidence of tricuspid stenosis. Aortic Valve: The aortic valve is tricuspid. There is moderate calcification of the aortic valve. There is moderate thickening of the aortic valve. Aortic valve regurgitation is not visualized. Mild aortic stenosis is present. Aortic valve mean gradient measures 9.0 mmHg. Aortic valve peak gradient measures 16.6 mmHg. Aortic valve area, by VTI measures 1.54 cm. Pulmonic Valve: The pulmonic valve was normal in structure. Pulmonic valve regurgitation is not visualized. No evidence of pulmonic stenosis. Aorta: The aortic root is normal in size and structure. Venous: The inferior vena cava is dilated in size with greater than 50% respiratory variability, suggesting right atrial pressure of 8 mmHg. IAS/Shunts: No atrial level shunt detected by color flow Doppler.  LEFT VENTRICLE PLAX 2D LVIDd:         4.00 cm   Diastology LVIDs:         2.60 cm   LV e' lateral:   13.40 cm/s LV PW:         1.50 cm   LV E/e' lateral: 8.8 LV IVS:         1.80 cm LVOT diam:     2.30 cm LV SV:         51 LV SV Index:   24 LVOT Area:     4.15 cm  RIGHT VENTRICLE             IVC RV S prime:     15.20 cm/s  IVC diam: 2.10 cm TAPSE (M-mode): 1.9 cm LEFT ATRIUM            Index        RIGHT ATRIUM           Index LA diam:      4.50 cm  2.18 cm/m   RA Area:     19.40 cm LA Vol (A2C): 104.0 ml 50.33 ml/m  RA Volume:   46.80 ml  22.65 ml/m LA Vol (A4C): 128.0 ml 61.94 ml/m  AORTIC VALVE AV Area (Vmax):    1.77 cm AV Area (Vmean):   1.67 cm AV Area (VTI):     1.54 cm AV Vmax:           204.00 cm/s AV Vmean:          137.000 cm/s AV VTI:            0.329 m AV Peak Grad:      16.6 mmHg AV Mean Grad:      9.0 mmHg LVOT Vmax:         87.00 cm/s LVOT Vmean:        55.067 cm/s LVOT VTI:          0.122 m LVOT/AV VTI ratio: 0.37  AORTA Ao Root diam: 3.70 cm Ao Asc diam:  3.90 cm MITRAL VALVE                TRICUSPID VALVE MV Area (PHT): 4.15 cm     TR Peak grad:   30.7 mmHg MV Decel Time: 183 msec     TR Vmax:        277.00 cm/s MR Peak grad: 31.4 mmHg  MR Vmax:      280.00 cm/s   SHUNTS MV E velocity: 118.00 cm/s  Systemic VTI:  0.12 m MV A velocity: 45.30 cm/s   Systemic Diam: 2.30 cm MV E/A ratio:  2.60 Charlton Haws MD Electronically signed by Charlton Haws MD Signature Date/Time: 04/03/2023/5:07:21 PM    Final    MR SHOULDER LEFT WO CONTRAST  Result Date: 04/03/2023 CLINICAL DATA:  Left shoulder pain. EXAM: MRI OF THE LEFT SHOULDER WITHOUT CONTRAST TECHNIQUE: Multiplanar, multisequence MR imaging of the shoulder was performed. No intravenous contrast was administered. COMPARISON:  None Available. FINDINGS: Rotator cuff: Complete tear of the supraspinatus and infraspinatus tendons with 5.7 cm of retraction. Teres minor tendon is intact. Moderate tendinosis of the subscapularis tendon with a small partial-thickness tear. Muscles: Severe atrophy of the supraspinatus, infraspinatus and subscapularis muscles. Biceps Long Head: Complete tear of the proximal long head of the  biceps tendon. Acromioclavicular Joint: Moderate arthropathy of the acromioclavicular joint. Small amount of subacromial/subdeltoid bursal fluid. Glenohumeral Joint: Large joint effusion. Partial-thickness cartilage loss of the glenohumeral joint with areas of high-grade partial-thickness cartilage loss. Labrum: Generalized labral degeneration. Bones: No fracture or dislocation. No aggressive osseous lesion. Other: No fluid collection or hematoma. Muscle edema in the short of the biceps muscle and coracobrachialis muscle concerning for muscle strain. IMPRESSION: 1. Complete tear of the supraspinatus and infraspinatus tendons with 5.7 cm of retraction. 2. Moderate tendinosis of the subscapularis tendon with a small partial-thickness tear. 3. Complete tear of the proximal long head of the biceps tendon. 4. Moderate osteoarthritis of the glenohumeral joint. Large glenohumeral joint effusion. Electronically Signed   By: Elige Ko M.D.   On: 04/03/2023 10:45   MR CERVICAL SPINE WO CONTRAST  Result Date: 04/03/2023 CLINICAL DATA:  In stage renal disease, cervical bone lesion. EXAM: MRI CERVICAL SPINE WITHOUT CONTRAST TECHNIQUE: Multiplanar, multisequence MR imaging of the cervical spine was performed. No intravenous contrast was administered. COMPARISON:  CT cervical spine 04/01/2023 FINDINGS: Alignment: Physiologic. Vertebrae: No acute fracture, evidence of discitis, or aggressive bone lesion. Cord: Normal signal and morphology. Posterior Fossa, vertebral arteries, paraspinal tissues: Posterior fossa demonstrates no focal abnormality. Vertebral artery flow voids are maintained. Paraspinal soft tissues are unremarkable. Disc levels: Discs: Degenerative disease with disc height loss at C4-5 and C5-6. Degenerative disease with disc height loss at C7-T1, T1-2 and T2-3. C2-3: Small central disc protrusion. Moderate right and mild left facet arthropathy. Mild bilateral foraminal stenosis. No spinal stenosis. C3-4:  Broad-based disc bulge. Broad-based disc bulge with a small central disc protrusion. Moderate bilateral facet arthropathy. No severe bilateral foraminal stenosis. Severe spinal stenosis. C4-5: Broad-based disc bulge with a broad central disc protrusion. Severe spinal stenosis. Severe bilateral foraminal stenosis. C5-6: Mild broad-based disc bulge. Moderate right facet arthropathy. Severe bilateral foraminal stenosis. Mild spinal stenosis. C6-7: Mild broad-based disc bulge. Mild bilateral facet arthropathy. Severe right foraminal stenosis. Moderate left foraminal stenosis. No spinal stenosis. C7-T1: Minimal broad-based disc bulge. No foraminal or central canal stenosis. T1-2: Broad-based disc bulge. Moderate bilateral foraminal stenosis. No spinal stenosis. Bilateral facet arthropathy. T2-3: Broad-based disc bulge. Bilateral facet arthropathy. Mild bilateral foraminal stenosis. IMPRESSION: 1. Diffuse cervical spine spondylosis as described above. 2. No acute osseous injury of the cervical spine. 3. No aggressive osseous lesion of the cervical spine. 4. At C3-4 there is a broad-based disc bulge with a small central disc protrusion. Moderate bilateral facet arthropathy. No severe bilateral foraminal stenosis. Severe spinal stenosis. 5. At C4-5 there is a broad-based  disc bulge with a broad central disc protrusion. Severe spinal stenosis. Severe bilateral foraminal stenosis. 6. At C5-6 there is a mild broad-based disc bulge. Moderate right facet arthropathy. Severe bilateral foraminal stenosis. Mild spinal stenosis. 7. At C6-7 there is a mild broad-based disc bulge. Mild bilateral facet arthropathy. Severe right foraminal stenosis. Moderate left foraminal stenosis. Electronically Signed   By: Elige Ko M.D.   On: 04/03/2023 10:40   VAS [place] LOWER EXTREMITY VENOUS (DVT)  Result Date: 04/02/2023  Lower Venous DVT Study Patient Name:  KAZIAH KOTARSKI  Date of Exam:   04/02/2023 Medical Rec #: [id number]           Accession #:    [id number] Date of Birth: 10/03/1950          Patient Gender: M Patient Age:   53 years Exam Location:  [facility] Procedure:      VAS [place] LOWER EXTREMITY VENOUS (DVT) Referring Phys: Kathlen Mody --------------------------------------------------------------------------------  Indications: Swelling.  Limitations: Body habitus and poor ultrasound/tissue interface. Comparison Study: Previous 01/09/18 negative. Performing Technologist: McKayla Maag RVT, [place]  Examination Guidelines: A complete evaluation includes B-mode imaging, spectral Doppler, color Doppler, and power Doppler as needed of all accessible portions of each vessel. Bilateral testing is considered an integral part of a complete examination. Limited examinations for reoccurring indications may be performed as noted. The reflux portion of the exam is performed with the patient in reverse Trendelenburg.  +-----+---------------+---------+-----------+----------+--------------+ RIGHTCompressibilityPhasicitySpontaneityPropertiesThrombus Aging +-----+---------------+---------+-----------+----------+--------------+ CFV  Full           Yes      Yes                                 +-----+---------------+---------+-----------+----------+--------------+ SFJ  Full                                                        +-----+---------------+---------+-----------+----------+--------------+   +---------+---------------+---------+-----------+----------+-------------------+ LEFT     CompressibilityPhasicitySpontaneityPropertiesThrombus Aging      +---------+---------------+---------+-----------+----------+-------------------+ CFV      Full           Yes      Yes                                      +---------+---------------+---------+-----------+----------+-------------------+ SFJ      Full                                                              +---------+---------------+---------+-----------+----------+-------------------+ FV Prox  Full                                                             +---------+---------------+---------+-----------+----------+-------------------+ FV Mid   Full                                                             +---------+---------------+---------+-----------+----------+-------------------+  FV DistalFull                                                             +---------+---------------+---------+-----------+----------+-------------------+ PFV      Full                                                             +---------+---------------+---------+-----------+----------+-------------------+ POP      Full           Yes      Yes                                      +---------+---------------+---------+-----------+----------+-------------------+ PTV      Full                                                             +---------+---------------+---------+-----------+----------+-------------------+ PERO                                                  Not well visualized +---------+---------------+---------+-----------+----------+-------------------+    Summary: RIGHT: - No evidence of common femoral vein obstruction.  LEFT: - There is no evidence of deep vein thrombosis in the lower extremity. However, portions of this examination were limited- see technologist comments above.  - No cystic structure found in the popliteal fossa.  *See table(s) above for measurements and observations. Electronically signed by Coral Else MD on 04/02/2023 at 9:15:20 PM.    Final    CT ABDOMEN PELVIS W CONTRAST  Result Date: 04/02/2023 CLINICAL DATA:  Abdominal pain, acute, nonlocalized. End stage renal disease. Nausea and vomiting. Sepsis. EXAM: CT ABDOMEN AND PELVIS WITH CONTRAST TECHNIQUE: Multidetector CT imaging of the abdomen and pelvis was performed using the standard protocol  following bolus administration of intravenous contrast. RADIATION DOSE REDUCTION: This exam was performed according to the departmental dose-optimization program which includes automated exposure control, adjustment of the mA and/or kV according to patient size and/or use of iterative reconstruction technique. CONTRAST:  75mL OMNIPAQUE IOHEXOL 350 MG/ML SOLN COMPARISON:  04/05/2009 FINDINGS: Lower chest: Mild dependent atelectasis at the lung bases. Tiny amount of pleural fluid. Non dependent lung as well aerated. There is cardiomegaly and coronary artery calcification. Hepatobiliary: No liver parenchymal abnormality. No calcified gallstones. No ductal dilatation. Pancreas: Normal Spleen: Normal Adrenals/Urinary Tract: Adrenal glands are normal. Atrophic kidneys, more so on the left than the right. No hydroureteronephrosis. No stone or mass. Bladder is not well seen because of artifact from a left hip replacement. No bladder distension. Stomach/Bowel: Stomach and small intestine are normal. Normal appendix. No abnormal colon finding. Diverticulosis without evidence of diverticulitis. Vascular/Lymphatic: Aorta and IVC are normal.  No adenopathy. Reproductive: Normal Other: No  free fluid or air. Musculoskeletal: Ordinary mild degenerative change affects the spine. IMPRESSION: 1. No acute finding by CT. 2. Atrophic kidneys, more so on the left than the right. 3. Diverticulosis without evidence of diverticulitis. 4. Cardiomegaly and coronary artery calcification. 5. Mild dependent atelectasis at the lung bases. Tiny amount of pleural fluid. Electronically Signed   By: Paulina Fusi M.D.   On: 04/02/2023 13:01   DG Abd Portable 1V  Result Date: 04/02/2023 CLINICAL DATA:  Nausea and vomiting EXAM: PORTABLE ABDOMEN - 1 VIEW COMPARISON:  Abdominal CT 04/05/2009 FINDINGS: Normal bowel gas pattern. Stool at the rectum without generalized abnormal stool retention. No concerning mass effect or calcification. Cardiomegaly with  clear lung bases. Generalized spondylosis. Unremarkable left hip replacement. IMPRESSION: Normal bowel gas pattern. Electronically Signed   By: Tiburcio Pea M.D.   On: 04/02/2023 06:59   DG Chest Port 1 View  Result Date: 04/02/2023 CLINICAL DATA:  Shortness of breath EXAM: PORTABLE CHEST 1 VIEW COMPARISON:  04/01/2023 FINDINGS: Chronic cardiomegaly. Stable mediastinal contours. Low volume but clear lungs with no effusion or pneumothorax. Artifact from EKG leads. IMPRESSION: Stable low volume chest with cardiomegaly. Electronically Signed   By: Tiburcio Pea M.D.   On: 04/02/2023 06:58   DG Shoulder Left  Result Date: 04/01/2023 CLINICAL DATA:  Pain after fall EXAM: LEFT SHOULDER - 2 VIEW COMPARISON:  None Available. FINDINGS: Osteopenia. Osteophyte formation of the glenohumeral joint and AC joint with some joint space loss. There is also elevation of the humeral head relative to the acromion. Please correlate for a full-thickness rotator cuff tear. No acute fracture or dislocation. Surgical clips in the left axillary region. IMPRESSION: Moderate degenerative changes. High-riding humeral head. Please correlate for a full-thickness rotator cuff tear Electronically Signed   By: Karen Kays M.D.   On: 04/01/2023 11:04   DG Knee Complete 4 Views Right  Result Date: 04/01/2023 CLINICAL DATA:  Pain after fall EXAM: RIGHT KNEE - COMPLETE 4 VIEW COMPARISON:  None Available. FINDINGS: Moderate joint space loss of the medial compartment. Small osteophytes seen of all 3 compartments greatest of the patellofemoral joint. There is a moderate joint effusion. Hyperostosis. Osteopenia. No fracture or dislocation. IMPRESSION: Tricompartment degenerative changes identified, overall moderate greatest involving the medial compartment and patellofemoral joint. Moderate joint effusion of uncertain etiology. This has a broad differential. This could be related to the degenerative changes. However please correlate for  history of trauma or infection. Electronically Signed   By: Karen Kays M.D.   On: 04/01/2023 11:03   DG Knee Complete 4 Views Left  Result Date: 04/01/2023 CLINICAL DATA:  Pain after fall.  Weakness for 2 days EXAM: LEFT KNEE - COMPLETE 4 VIEW COMPARISON:  None Available. FINDINGS: Osteopenia. Slight joint space loss of the medial compartment. Small osteophytes of all 3 compartments. No joint effusion on lateral view. No fracture or dislocation. Hyperostosis of the patella. Vascular calcifications. IMPRESSION: Mild degenerative changes.  No joint effusion. Electronically Signed   By: Karen Kays M.D.   On: 04/01/2023 11:00   CT Head Wo Contrast  Result Date: 04/01/2023 CLINICAL DATA:  Provided history: Polytrauma, blunt. EXAM: CT HEAD WITHOUT CONTRAST CT CERVICAL SPINE WITHOUT CONTRAST TECHNIQUE: Multidetector CT imaging of the head and cervical spine was performed following the standard protocol without intravenous contrast. Multiplanar CT image reconstructions of the cervical spine were also generated. RADIATION DOSE REDUCTION: This exam was performed according to the departmental dose-optimization program which includes automated exposure control, adjustment of  the mA and/or kV according to patient size and/or use of iterative reconstruction technique. COMPARISON:  None. FINDINGS: CT HEAD FINDINGS Brain: Mild generalized parenchymal atrophy. There is no acute intracranial hemorrhage. No demarcated cortical infarct. No extra-axial fluid collection. No evidence of an intracranial mass. No midline shift. Vascular: No hyperdense vessel.  Atherosclerotic calcifications. Skull: No calvarial fracture or aggressive osseous lesion. Sinuses/Orbits: No mass or acute finding within the imaged orbits. Mild-to-moderate mucosal thickening, and small volume frothy secretions, within the right maxillary sinus. Small mucous retention cyst within the left maxillary sinus. CT CERVICAL SPINE FINDINGS Alignment: No  significant spondylolisthesis. Skull base and vertebrae: The basion-dental and atlanto-dental intervals are maintained.No evidence of acute fracture to the cervical spine. Soft tissues and spinal canal: No prevertebral fluid or swelling. No visible canal hematoma. Disc levels: Cervical spondylosis with multilevel disc space narrowing, disc bulges/central disc protrusions, endplate spurring, uncovertebral hypertrophy and facet arthrosis. Disc space narrowing is greatest at C5-C6 (moderate/advanced), C7-T1 (moderate/advanced), T1-T2 (advanced) and T2-T3 (advanced). 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 Most notably at C2-C3 and C3-C4, central disc protrusions contribute to at least moderate spinal canal stenosis. Multilevel bony neural foraminal narrowing. Upper chest: No consolidation within the imaged lung apices. No visible pneumothorax. IMPRESSION: CT head: 1.  No evidence of an acute intracranial abnormality. 2. Mild generalized parenchymal atrophy. 3. Paranasal sinus disease as described. CT cervical spine: 1. No evidence of an acute cervical spine fracture. 2. Cervical spondylosis as outlined. Notably at C2-C3 and C3-C4, central disc protrusions contribute to at least moderate spinal canal stenosis. Multilevel bony neural from narrowing. Electronically Signed   By: Jackey Loge D.O.   On: 04/01/2023 10:32   CT Cervical Spine Wo Contrast  Result Date: 04/01/2023 CLINICAL DATA:  Provided history: Polytrauma, blunt. EXAM: CT HEAD WITHOUT CONTRAST CT CERVICAL SPINE WITHOUT CONTRAST TECHNIQUE: Multidetector CT imaging of the head and cervical spine was performed following the standard protocol without intravenous contrast. Multiplanar CT image reconstructions of the cervical spine were also generated. RADIATION DOSE REDUCTION: This exam was performed according to the departmental dose-optimization program which includes automated exposure control,  adjustment of the mA and/or kV according to patient size and/or use of iterative reconstruction technique. COMPARISON:  None. FINDINGS: CT HEAD FINDINGS Brain: Mild generalized parenchymal atrophy. There is no acute intracranial hemorrhage. No demarcated cortical infarct. No extra-axial fluid collection. No evidence of an intracranial mass. No midline shift. Vascular: No hyperdense vessel.  Atherosclerotic calcifications. Skull: No calvarial fracture or aggressive osseous lesion. Sinuses/Orbits: No mass or acute finding within the imaged orbits. Mild-to-moderate mucosal thickening, and small volume frothy secretions, within the right maxillary sinus. Small mucous retention cyst within the left maxillary sinus. CT CERVICAL SPINE FINDINGS Alignment: No significant spondylolisthesis. Skull base and vertebrae: The basion-dental and atlanto-dental intervals are maintained.No evidence of acute fracture to the cervical spine. Soft tissues and spinal canal: No prevertebral fluid or swelling. No visible canal hematoma. Disc levels: Cervical spondylosis with multilevel disc space narrowing, disc bulges/central disc protrusions, endplate spurring, uncovertebral hypertrophy and facet arthrosis. Disc space narrowing is greatest at C5-C6 (moderate/advanced), C7-T1 (moderate/advanced), T1-T2 (advanced) and T2-T3 (advanced). 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 Most notably at C2-C3 and C3-C4, central disc protrusions contribute to at least moderate spinal canal stenosis. Multilevel bony neural foraminal narrowing. Upper chest: No consolidation within the imaged lung apices. No visible pneumothorax. IMPRESSION: CT head: 1.  No evidence  of an acute intracranial abnormality. 2. Mild generalized parenchymal atrophy. 3. Paranasal sinus disease as described. CT cervical spine: 1. No evidence of an acute cervical spine fracture. 2. Cervical spondylosis as outlined. Notably at  C2-C3 and C3-C4, central disc protrusions contribute to at least moderate spinal canal stenosis. Multilevel bony neural from narrowing. Electronically Signed   By: Jackey Loge D.O.   On: 04/01/2023 10:32      Signature  -   Susa Raring M.D on 04/05/2023 at 8:35 AM   -  To page go to [email or web address]

## 2023-04-05 NOTE — Progress Notes (Signed)
Peter [name] Progress Note    Assessment/ Plan: Pt is a 72 y.o. yo male with ESRD, T2DM, HTN, OSA, A-fib (on Eliquis), HL who was admitted with pneumonia.    Dialysis Orders:  MWF at [facility] 4hr, 450/800, EDW 97.5kg, 3K/2Ca, AVG, 15g, heparin 10,000 bolus - no ESA, Hgb > 12 - No VDRA (recently stopped d/t Ca 10.9)   # Sepsis due to aspiration pneumonia, septic joint: Currently on antibiotics, requiring oxygenation. Per primary team.  # MSSA bacteremia: 1st set Blood Cx 7/2 positive w/ aspirate from knee staph aureus.  AVG without signs of infection. ID following, remains on unasyn and nafcillin. Strong suspicion for endocarditis. Plan for TEE next week.   # R knee aspirate with ^ WBC: Likely septic arthritis. Also concern for shoulder effusion. MRI shoulder ordered. Plan for L shoulder aspiration today    # ESRD: Continue HD on usual MWF schedule.  Next HD 7/8.    # Anemia of CKD: Hemoglobin above goal.  No need for ESA.   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  Continue Sensipar and Auryxia.  Monitor lab.   # HTN/volume: BP okay.  Off of antihypertensives. Does have edema.  # Elevated LFTs  # A-Fib  Subjective:  Completed dialysis yesterday -net UF 1.4L  Seen in room. Afebrile. Asked how he's doing - he says "I wish I knew"    Objective Vitals:   04/04/23 2200 04/05/23 0000 04/05/23 0400 04/05/23 0824  BP:  104/67 109/69 108/60  Pulse: 84 84 73 79  Resp: 19 16  16   Temp:  98.2 F (36.8 C) 98 F (36.7 C) (!) 97.1 F (36.2 C)  TempSrc:  Axillary Oral Axillary  SpO2: 90% 93% 92% 95%  Weight:      Height:       Physical Exam General: Ill appearing man, awake and conversant Heart:Irregularly irregular Lungs: CTA anteriorly, poor effort Abdomen: soft Extremities: 1+ BLE edema; 1+ RUE edema Dialysis Access: R AVG + bruit  Additional Objective Labs: Basic Metabolic Panel: Recent Labs  Lab 04/02/23 0555 04/03/23 2115 04/04/23 0441   NA 131* 130* 127*  K 5.2* 4.4 4.1  CL 90* 87* 89*  CO2 21* 25 24  GLUCOSE 117* 143* 108*  BUN 58* 68* 77*  CREATININE 7.96* 7.58* 8.18*  CALCIUM 9.1 8.7* 8.2*  PHOS 4.7*  --   --     Liver Function Tests: Recent Labs  Lab 04/01/23 1447 04/02/23 0555 04/03/23 1058 04/04/23 0441  AST 172*  --  158* 230*  ALT 65*  --  76* 92*  ALKPHOS 39  --  45 48  BILITOT 1.0  --  1.3* 3.4*  PROT 6.6  --  6.5 5.9*  ALBUMIN 2.5* 2.3* 1.9* 1.7*    CBC: Recent Labs  Lab 04/01/23 0851 04/02/23 0340 04/03/23 0140 04/04/23 0441 04/05/23 0315  WBC 13.8* 9.0 8.8 8.8 10.3  NEUTROABS 12.1* 7.5 7.2 6.7 7.3  HGB 13.8 12.9* 12.1* 11.7* 12.4*  HCT 40.6 37.0* 35.2* 33.5* 35.4*  MCV 98.8 97.4 97.2 96.3 95.4  PLT 85* 77* 87* 98* 130*    Blood Culture    Component Value Date/Time   SDES BLOOD LEFT HAND 04/03/2023 1447   SPECREQUEST  04/03/2023 1447    BOTTLES DRAWN AEROBIC AND ANAEROBIC Blood Culture adequate volume   CULT  04/03/2023 1447    NO GROWTH 2 DAYS Performed at [facility], [address] [place], [place] [address]  REPTSTATUS PENDING 04/03/2023 1447   Studies/Results: DG Wrist Complete Right  Result Date: 04/04/2023 CLINICAL DATA:  Recent fall with right wrist pain, initial encounter EXAM: RIGHT WRIST - COMPLETE 3+ VIEW COMPARISON:  CT of the wrist obtained on the same day. 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 No acute fracture or dislocation is noted. Vascular calcifications are noted as well as mild soft tissue swelling. No acute abnormality seen. IMPRESSION: No acute abnormality noted. Electronically Signed   By: Alcide Clever M.D.   On: 04/04/2023 19:11   CT WRIST RIGHT W CONTRAST  Result Date: 04/04/2023 CLINICAL DATA:  Evaluate for septic joint/effusion. Unable to tolerate MRI. EXAM: CT OF THE UPPER RIGHT EXTREMITY WITH CONTRAST TECHNIQUE: Multidetector CT imaging of the upper right extremity was performed according to the  standard protocol following intravenous contrast administration. RADIATION DOSE REDUCTION: This exam was performed according to the departmental dose-optimization program which includes automated exposure control, adjustment of the mA and/or kV according to patient size and/or use of iterative reconstruction technique. CONTRAST:  75mL OMNIPAQUE IOHEXOL 350 MG/ML SOLN COMPARISON:  None Available. FINDINGS: Patient scanned with arms down positioning which limits detailed osseous and soft tissue assessment due to soft tissue attenuation from habitus. Bones/Joint/Cartilage No fracture. Possible remote triquetral fracture. No erosive change. There may be a small distal radioulnar joint effusion, series 5, image 62. Suspected small radiocarpal joint effusion sagittal series 11, image 66. This is difficult to accurately assess on the current exam. Ligaments Suboptimally assessed by CT. Muscles and Tendons No obvious intramuscular collection. Limited assessment for tenosynovial fluid. Soft tissues No soft tissue subcutaneous collection. Mild edema is noted about the ulnar aspect of the wrist and distal forearm. Incidental note of edema in the subcutaneous tissues about the right flank, partially included in this field of view. IMPRESSION: 1. Patient scanned with arms down positioning which limits detailed osseous and soft tissue assessment due to soft tissue attenuation from habitus. 2. There may be a small distal radioulnar joint effusion. Suspected small radiocarpal joint effusion. This is difficult to accurately assess on the current exam. No gross CT findings of osteomyelitis. 3. Mild edema about the ulnar aspect of the wrist and distal forearm. No soft tissue collection. Electronically Signed   By: Narda Rutherford M.D.   On: 04/04/2023 17:06   ECHOCARDIOGRAM COMPLETE  Result Date: 04/03/2023    ECHOCARDIOGRAM REPORT   Patient Name:   Peter [name] Date of Exam: 04/03/2023 Medical Rec #:  [id number]         Height:        66.0 in Accession #:    [id number]        Weight:       216.0 lb Date of Birth:  Apr 12, 1951         BSA:          2.067 m Patient Age:    72 years          BP:           106/33 mmHg Patient Gender: M                 HR:           110 bpm. Exam Location:  Inpatient Procedure: 2D Echo, Cardiac Doppler and Color Doppler Indications:    Bacteremia R78.81  History:        Patient has prior history of Echocardiogram examinations, most  recent 05/30/2018. CHF, Arrythmias:Atrial Fibrillation and                 Bradycardia, Signs/Symptoms:Shortness of Breath and Syncope;                 Risk Factors:Hypertension, Sleep Apnea and Diabetes. ESRD,                 Arteriovenous fistula.  Sonographer:    Lucendia Herrlich Referring Phys: [id number] CORNELIUS N VAN DAM IMPRESSIONS  1. Left ventricular ejection fraction, by estimation, is 60 to 65%. The left ventricle has normal function. The left ventricle has no regional wall motion abnormalities. There is severe left ventricular hypertrophy. Left ventricular diastolic parameters  are indeterminate.  2. Right ventricular systolic function is normal. The right ventricular size is normal.  3. Left atrial size was moderately dilated.  4. Mild appearing MR but some splay artifact cannot r/o some posterior leaflet prolapse in addition to MAC with anteriorly directed MR Given bacteremia and abnormal posterior mitral annulus suggest TEE to further assess if clnically indicated. The mitral valve is degenerative. Mild mitral valve regurgitation. No evidence of mitral stenosis. Moderate mitral annular calcification.  5. The aortic valve is tricuspid. There is moderate calcification of the aortic valve. There is moderate thickening of the aortic valve. Aortic valve regurgitation is not visualized. Mild aortic valve stenosis.  6. The inferior vena cava is dilated in size with >50% respiratory variability, suggesting right atrial pressure of 8 mmHg. FINDINGS  Left Ventricle:  Left ventricular ejection fraction, by estimation, is 60 to 65%. The left ventricle has normal function. The left ventricle has no regional wall motion abnormalities. The left ventricular internal cavity size was normal in size. There is  severe left ventricular hypertrophy. Left ventricular diastolic parameters are indeterminate. Right Ventricle: The right ventricular size is normal. No increase in right ventricular wall thickness. Right ventricular systolic function is normal. Left Atrium: Left atrial size was moderately dilated. Right Atrium: Right atrial size was normal in size. Pericardium: There is no evidence of pericardial effusion. Mitral Valve: Mild appearing MR but some splay artifact cannot r/o some posterior leaflet prolapse in addition to MAC with anteriorly directed MR Given bacteremia and abnormal posterior mitral annulus suggest TEE to further assess if clnically indicated.  The mitral valve is degenerative in appearance. There is moderate thickening of the mitral valve leaflet(s). There is moderate calcification of the mitral valve leaflet(s). Moderate mitral annular calcification. Mild mitral valve regurgitation. No evidence of mitral valve stenosis. Tricuspid Valve: The tricuspid valve is normal in structure. Tricuspid valve regurgitation is mild . No evidence of tricuspid stenosis. Aortic Valve: The aortic valve is tricuspid. There is moderate calcification of the aortic valve. There is moderate thickening of the aortic valve. Aortic valve regurgitation is not visualized. Mild aortic stenosis is present. Aortic valve mean gradient measures 9.0 mmHg. Aortic valve peak gradient measures 16.6 mmHg. Aortic valve area, by VTI measures 1.54 cm. Pulmonic Valve: The pulmonic valve was normal in structure. Pulmonic valve regurgitation is not visualized. No evidence of pulmonic stenosis. Aorta: The aortic root is normal in size and structure. Venous: The inferior vena cava is dilated in size with greater  than 50% respiratory variability, suggesting right atrial pressure of 8 mmHg. IAS/Shunts: No atrial level shunt detected by color flow Doppler.  LEFT VENTRICLE PLAX 2D LVIDd:         4.00 cm   Diastology LVIDs:  2.60 cm   LV e' lateral:   13.40 cm/s LV PW:         1.50 cm   LV E/e' lateral: 8.8 LV IVS:        1.80 cm LVOT diam:     2.30 cm LV SV:         51 LV SV Index:   24 LVOT Area:     4.15 cm  RIGHT VENTRICLE             IVC RV S prime:     15.20 cm/s  IVC diam: 2.10 cm TAPSE (M-mode): 1.9 cm LEFT ATRIUM            Index        RIGHT ATRIUM           Index LA diam:      4.50 cm  2.18 cm/m   RA Area:     19.40 cm LA Vol (A2C): 104.0 ml 50.33 ml/m  RA Volume:   46.80 ml  22.65 ml/m LA Vol (A4C): 128.0 ml 61.94 ml/m  AORTIC VALVE AV Area (Vmax):    1.77 cm AV Area (Vmean):   1.67 cm AV Area (VTI):     1.54 cm AV Vmax:           204.00 cm/s AV Vmean:          137.000 cm/s AV VTI:            0.329 m AV Peak Grad:      16.6 mmHg AV Mean Grad:      9.0 mmHg LVOT Vmax:         87.00 cm/s LVOT Vmean:        55.067 cm/s LVOT VTI:          0.122 m LVOT/AV VTI ratio: 0.37  AORTA Ao Root diam: 3.70 cm Ao Asc diam:  3.90 cm MITRAL VALVE                TRICUSPID VALVE MV Area (PHT): 4.15 cm     TR Peak grad:   30.7 mmHg MV Decel Time: 183 msec     TR Vmax:        277.00 cm/s MR Peak grad: 31.4 mmHg MR Vmax:      280.00 cm/s   SHUNTS MV E velocity: 118.00 cm/s  Systemic VTI:  0.12 m MV A velocity: 45.30 cm/s   Systemic Diam: 2.30 cm MV E/A ratio:  2.60 Charlton Haws MD Electronically signed by Charlton Haws MD Signature Date/Time: 04/03/2023/5:07:21 PM    Final    MR SHOULDER LEFT WO CONTRAST  Result Date: 04/03/2023 CLINICAL DATA:  Left shoulder pain. EXAM: MRI OF THE LEFT SHOULDER WITHOUT CONTRAST TECHNIQUE: Multiplanar, multisequence MR imaging of the shoulder was performed. No intravenous contrast was administered. COMPARISON:  None Available. FINDINGS: Rotator cuff: Complete tear of the supraspinatus and  infraspinatus tendons with 5.7 cm of retraction. Teres minor tendon is intact. Moderate tendinosis of the subscapularis tendon with a small partial-thickness tear. Muscles: Severe atrophy of the supraspinatus, infraspinatus and subscapularis muscles. Biceps Long Head: Complete tear of the proximal long head of the biceps tendon. Acromioclavicular Joint: Moderate arthropathy of the acromioclavicular joint. Small amount of subacromial/subdeltoid bursal fluid. Glenohumeral Joint: Large joint effusion. Partial-thickness cartilage loss of the glenohumeral joint with areas of high-grade partial-thickness cartilage loss. Labrum: Generalized labral degeneration. Bones: No fracture or dislocation. No aggressive osseous lesion. Other: No fluid collection or hematoma. Muscle  edema in the short of the biceps muscle and coracobrachialis muscle concerning for muscle strain. IMPRESSION: 1. Complete tear of the supraspinatus and infraspinatus tendons with 5.7 cm of retraction. 2. Moderate tendinosis of the subscapularis tendon with a small partial-thickness tear. 3. Complete tear of the proximal long head of the biceps tendon. 4. Moderate osteoarthritis of the glenohumeral joint. Large glenohumeral joint effusion. Electronically Signed   By: Elige Ko M.D.   On: 04/03/2023 10:45   MR CERVICAL SPINE WO CONTRAST  Result Date: 04/03/2023 CLINICAL DATA:  In stage renal disease, cervical bone lesion. EXAM: MRI CERVICAL SPINE WITHOUT CONTRAST TECHNIQUE: Multiplanar, multisequence MR imaging of the cervical spine was performed. No intravenous contrast was administered. COMPARISON:  CT cervical spine 04/01/2023 FINDINGS: Alignment: Physiologic. Vertebrae: No acute fracture, evidence of discitis, or aggressive bone lesion. Cord: Normal signal and morphology. Posterior Fossa, vertebral arteries, paraspinal tissues: Posterior fossa demonstrates no focal abnormality. Vertebral artery flow voids are maintained. Paraspinal soft tissues are  unremarkable. Disc levels: Discs: Degenerative disease with disc height loss at C4-5 and C5-6. Degenerative disease with disc height loss at C7-T1, T1-2 and T2-3. C2-3: Small central disc protrusion. Moderate right and mild left facet arthropathy. Mild bilateral foraminal stenosis. No spinal stenosis. C3-4: Broad-based disc bulge. Broad-based disc bulge with a small central disc protrusion. Moderate bilateral facet arthropathy. No severe bilateral foraminal stenosis. Severe spinal stenosis. C4-5: Broad-based disc bulge with a broad central disc protrusion. Severe spinal stenosis. Severe bilateral foraminal stenosis. C5-6: Mild broad-based disc bulge. Moderate right facet arthropathy. Severe bilateral foraminal stenosis. Mild spinal stenosis. C6-7: Mild broad-based disc bulge. Mild bilateral facet arthropathy. Severe right foraminal stenosis. Moderate left foraminal stenosis. No spinal stenosis. C7-T1: Minimal broad-based disc bulge. No foraminal or central canal stenosis. T1-2: Broad-based disc bulge. Moderate bilateral foraminal stenosis. No spinal stenosis. Bilateral facet arthropathy. T2-3: Broad-based disc bulge. Bilateral facet arthropathy. Mild bilateral foraminal stenosis. IMPRESSION: 1. Diffuse cervical spine spondylosis as described above. 2. No acute osseous injury of the cervical spine. 3. No aggressive osseous lesion of the cervical spine. 4. At C3-4 there is a broad-based disc bulge with a small central disc protrusion. Moderate bilateral facet arthropathy. No severe bilateral foraminal stenosis. Severe spinal stenosis. 5. At C4-5 there is a broad-based disc bulge with a broad central disc protrusion. Severe spinal stenosis. Severe bilateral foraminal stenosis. 6. At C5-6 there is a mild broad-based disc bulge. Moderate right facet arthropathy. Severe bilateral foraminal stenosis. Mild spinal stenosis. 7. At C6-7 there is a mild broad-based disc bulge. Mild bilateral facet arthropathy. Severe right  foraminal stenosis. Moderate left foraminal stenosis. Electronically Signed   By: Elige Ko M.D.   On: 04/03/2023 10:40   Medications:  heparin 1,350 Units/hr (04/05/23 0602)   nafcillin IV 2 g (04/05/23 0814)    (feeding supplement) PROSource Plus  30 mL Oral BID BM   atorvastatin  10 mg Oral QHS   bupivacaine(PF)  10 mL Infiltration Once   Chlorhexidine Gluconate Cloth  6 each Topical Daily   Chlorhexidine Gluconate Cloth  6 each Topical Q0600   cinacalcet  180 mg Oral Q breakfast   ferric citrate  630 mg Oral TID WC   metoprolol tartrate  12.5 mg Oral BID   multivitamin  1 tablet Oral QHS   pantoprazole (PROTONIX) IV  40 mg Intravenous Q12H     Tomasa Blase PA-C [name] 04/05/2023,9:11 AM

## 2023-04-05 NOTE — Progress Notes (Signed)
[facility] Progress Note holiday coverage   Patient ID: Peter [name] MRN: [id number] DOB/AGE: 1950/11/25 72 y.o.  Subjective:  Patient doing fair this morning.  Notes pain is manageable at rest.  Tired appearing   Remains on unasyn and nafcillin per ID. Remains afebrile with normal White count   ROS As above  Objective:   VITALS:   Vitals:   04/04/23 1800 04/04/23 2000 04/04/23 2200 04/05/23 0000  BP:  (!) 134/47  104/67  Pulse: 87 73 84 84  Resp: (!) 22 20 19 16   Temp:  98.5 F (36.9 C)  98.2 F (36.8 C)  TempSrc:  Oral  Axillary  SpO2: 90% 90% 90% 93%  Weight:      Height:        Estimated body mass index is 34.66 kg/m as calculated from the following:   Height as of this encounter: 5\' 6"  (1.676 m).   Weight as of this encounter: 97.4 kg.   Intake/Output      07/05 0701 07/06 0700 07/06 0701 07/07 0700   P.O. 240    I.V. (mL/kg)     IV Piggyback 208    Total Intake(mL/kg) 448 (4.6)    Other 1400    Total Output 1400    Net -952           LABS  Results for orders placed or performed during the hospital encounter of 04/01/23 (from the past 24 hour(s))  Brain natriuretic peptide     Status: Abnormal   Collection Time: 04/05/23  3:15 AM  Result Value Ref Range   B Natriuretic Peptide 142.3 (H) 0.0 - 100.0 pg/mL  Heparin level (unfractionated)     Status: None   Collection Time: 04/05/23  3:15 AM  Result Value Ref Range   Heparin Unfractionated 0.47 0.30 - 0.70 IU/mL  APTT     Status: Abnormal   Collection Time: 04/05/23  3:15 AM  Result Value Ref Range   aPTT 61 (H) 24 - 36 seconds  Procalcitonin     Status: None   Collection Time: 04/05/23  3:15 AM  Result Value Ref Range   Procalcitonin 23.82 ng/mL  CBC with Differential/Platelet     Status: Abnormal   Collection Time: 04/05/23  3:15 AM  Result Value Ref Range   WBC 10.3 4.0 - 10.5 K/uL   RBC 3.71  (L) 4.22 - 5.81 MIL/uL   Hemoglobin 12.4 (L) 13.0 - 17.0 g/dL   HCT 40.9 (L) 81.1 - 91.4 %   MCV 95.4 80.0 - 100.0 fL   MCH 33.4 26.0 - 34.0 pg   MCHC 35.0 30.0 - 36.0 g/dL   RDW 78.2 95.6 - 21.3 %   Platelets 130 (L) 150 - 400 K/uL   nRBC 0.0 0.0 - 0.2 %   Neutrophils Relative % 71 %   Neutro Abs 7.3 1.7 - 7.7 K/uL   Lymphocytes Relative 17 %   Lymphs Abs 1.8 0.7 - 4.0 K/uL   Monocytes Relative 7 %   Monocytes Absolute 0.7 0.1 - 1.0 K/uL   Eosinophils Relative 2 %   Eosinophils Absolute 0.2 0.0 - 0.5 K/uL   Basophils Relative 0 %   Basophils Absolute 0.0 0.0 - 0.1 K/uL   nRBC 0 0 /100 WBC   Metamyelocytes Relative 1 %  Myelocytes 2 %   Abs Immature Granulocytes 0.30 (H) 0.00 - 0.07 K/uL   Burr Cells PRESENT    Polychromasia PRESENT   C-reactive protein     Status: Abnormal   Collection Time: 04/05/23  3:15 AM  Result Value Ref Range   CRP 26.4 (H) <1.0 mg/dL   PHYSICAL EXAM:   Gen: awake but tired appearing.  Lungs: unlabored Ext:  - Left Upper extremity              Still no significant pain with passive ROM of L shoulder. Tolerates near full range. Unable to move shoulder actively. Motor and sensory functions intact. No erythema around shoulder              - Right lower extremity              R knee with persistent mild effusion. Bandaid from previous aspiration removed. No erythema. No real tenderness with firm palpation around knee. Tolerates about a 45 degree arc in motion. He does lack full extension which I suspect is baseline from his severe arthritis.    - Right upper extremity    Not significantly tender about the right shoulder. Notes increase in pain with passive motion of the shoulder. Cannot actively forward elevate the arm. Wrist is tender over the distal radius and ulnar styloid. No significant tenderness over the carpals. Pain worsens with motion of the wrist. No appreciable increased temp to wrist. Fingers warm and well perfused. Able to wiggle fingers  some. Sensation intact through median, ulnar, radial nerve distributions.    Assessment/Plan:  Anti-infectives (From admission, onward)    Start     Dose/Rate Route Frequency Ordered Stop   04/03/23 1200  nafcillin 2 g in sodium chloride 0.9 % 100 mL IVPB        2 g 216 mL/hr over 30 Minutes Intravenous Every 4 hours 04/03/23 1112     04/03/23 1145  nafcillin injection 2 g  Status:  Discontinued        2 g Intravenous Every 4 hours 04/03/23 1050 04/03/23 1112   04/02/23 1245  Ampicillin-Sulbactam (UNASYN) 3 g in sodium chloride 0.9 % 100 mL IVPB  Status:  Discontinued        3 g 200 mL/hr over 30 Minutes Intravenous Daily 04/02/23 1152 04/03/23 1050   04/01/23 2200  ceFAZolin (ANCEF) IVPB 1 g/50 mL premix  Status:  Discontinued        1 g 100 mL/hr over 30 Minutes Intravenous Every 24 hours 04/01/23 2039 04/02/23 1146   04/01/23 0830  cefTRIAXone (ROCEPHIN) 2 g in sodium chloride 0.9 % 100 mL IVPB  Status:  Discontinued        2 g 200 mL/hr over 30 Minutes Intravenous Every 24 hours 04/01/23 0823 04/01/23 2039   04/01/23 0830  azithromycin (ZITHROMAX) 500 mg in sodium chloride 0.9 % 250 mL IVPB  Status:  Discontinued        500 mg 250 mL/hr over 60 Minutes Intravenous Every 24 hours 04/01/23 0823 04/01/23 2039     .  72 y/o male with numerous medical comorbidities including ESRD on HD admitted for sepsis with R knee effusion and L shoulder effusion with acute on chronic changes    R knee effusion, L shoulder effusion, R wrist pain, R shoulder effusion   - IR plans L shoulder aspiration later today   - R wrist films negative for any acute fracture. Chronic ulnar styloid fracture with nonunion noted.  CT scan R wrist shows possible small effusion at the radioulnar joint. No aspiration of this needed   - MRI of R shoulder completed this AM, results pending.     L shoulder effusion, RTC tear, complete tear of long head of biceps, GH arthritis              - Pt reports chronic shoulder  issues              - Given effusion its possible there is some acute injury there as well but suspect rotator cuff tear is somewhat chronic in nature                           - As above will have IR perform arthrocentesis of this    ID: Continue abx at the direction of the ID service     Dispo: Will continue to follow. Keep NPO for now until IR aspiration of L shoulder completed and MRI R shoulder results available. Patient with possible endocarditis. Plan for TEE next week   Thompson Caul PA-C [facility] [phone] (office) [email or web address]     After 5pm and on the weekends please log on to Amion, go to orthopaedics and the look under the [facility] Call for the provider(s) on call. You can also call our office at [phone] and then follow the prompts to be connected to the call team.

## 2023-04-05 NOTE — Progress Notes (Signed)
Progress Note  Patient Name: Peter [name] Date of Encounter: 04/05/2023  Primary Cardiologist: Rollene Rotunda, MD   Subjective   Oxygen cannula is irritating his nose.  Otherwise no new complaints  Inpatient Medications    Scheduled Meds:  (feeding supplement) PROSource Plus  30 mL Oral BID BM   atorvastatin  10 mg Oral QHS   bupivacaine(PF)  10 mL Infiltration Once   Chlorhexidine Gluconate Cloth  6 each Topical Daily   Chlorhexidine Gluconate Cloth  6 each Topical Q0600   cinacalcet  180 mg Oral Q breakfast   ferric citrate  630 mg Oral TID WC   metoprolol tartrate  12.5 mg Oral BID   multivitamin  1 tablet Oral QHS   pantoprazole (PROTONIX) IV  40 mg Intravenous Q12H   Continuous Infusions:  heparin 1,350 Units/hr (04/05/23 0602)   nafcillin IV 2 g (04/05/23 0814)   PRN Meds: acetaminophen, diltiazem, lidocaine (PF), mouth rinse, oxyCODONE   Vital Signs    Vitals:   04/04/23 2200 04/05/23 0000 04/05/23 0400 04/05/23 0824  BP:  104/67 109/69 108/60  Pulse: 84 84 73 79  Resp: 19 16  16   Temp:  98.2 F (36.8 C) 98 F (36.7 C) (!) 97.1 F (36.2 C)  TempSrc:  Axillary Oral Axillary  SpO2: 90% 93% 92% 95%  Weight:      Height:        Intake/Output Summary (Last 24 hours) at 04/05/2023 0956 Last data filed at 04/05/2023 0020 Gross per 24 hour  Intake 588 ml  Output 1400 ml  Net -812 ml   Filed Weights   04/02/23 1908 04/04/23 1029 04/04/23 1506  Weight: 98 kg 98.8 kg 97.4 kg    Telemetry    Rate controlled atrial fibrillation with frequent PVCs- Personally Reviewed  ECG    Atrial fibrillation with PVCs- Personally Reviewed  Physical Exam   GEN: No acute distress.   Neck: No JVD Cardiac: Heart sounds faint RRR, no rubs, or gallops.  Holosystolic murmur to the left axilla. Respiratory: Clear to auscultation bilaterally. GI: Soft, nontender, non-distended  MS: No edema; No deformity. Neuro:  Nonfocal  Psych: Normal affect   Labs     Chemistry Recent Labs  Lab 04/01/23 1447 04/02/23 0555 04/03/23 1058 04/03/23 2115 04/04/23 0441  NA 127* 131*  --  130* 127*  K 5.6* 5.2*  --  4.4 4.1  CL 93* 90*  --  87* 89*  CO2 14* 21*  --  25 24  GLUCOSE 125* 117*  --  143* 108*  BUN 104* 58*  --  68* 77*  CREATININE 13.58* 7.96*  --  7.58* 8.18*  CALCIUM 9.1 9.1  --  8.7* 8.2*  PROT 6.6  --  6.5  --  5.9*  ALBUMIN 2.5* 2.3* 1.9*  --  1.7*  AST 172*  --  158*  --  230*  ALT 65*  --  76*  --  92*  ALKPHOS 39  --  45  --  48  BILITOT 1.0  --  1.3*  --  3.4*  GFRNONAA 3* 7*  --  7* 6*  ANIONGAP 20* 20*  --  18* 14     Hematology Recent Labs  Lab 04/03/23 0140 04/04/23 0441 04/05/23 0315  WBC 8.8 8.8 10.3  RBC 3.62* 3.48* 3.71*  HGB 12.1* 11.7* 12.4*  HCT 35.2* 33.5* 35.4*  MCV 97.2 96.3 95.4  MCH 33.4 33.6 33.4  MCHC 34.4 34.9 35.0  RDW 14.3 14.2 14.4  PLT 87* 98* 130*    Cardiac EnzymesNo results for input(s): "TROPONINI" in the last 168 hours. No results for input(s): "TROPIPOC" in the last 168 hours.   BNP Recent Labs  Lab 04/03/23 0140 04/04/23 0441 04/05/23 0315  BNP 136.6* 112.2* 142.3*     DDimer No results for input(s): "DDIMER" in the last 168 hours.   Summary of Pertinent studies    TTE: 7/4 EF 60 to 65%, mild MR with posterior leaflet prolapse, significant MAC.  Endocarditis cannot be excluded  Cardiac cath:  Imaging:  Labs:   Patient Profile     72 y.o. male with history of ESRD on hemodialysis, diabetes, persistent atrial fibrillation, mitral valve regurgitation, hypertension, hyperlipidemia who was admitted on 04/01/2023 for altered mental status and sepsis. Found to have Staph aureus bacteremia. Cardiology consulted for atrial fibrillation and possible endocarditis.   Assessment & Plan    Persistent atrial fibrillation First ECG in our system documenting atrial fibrillation dates to April of this year (P waves are present in the 2022 ECGs) He recently wore a heart monitor  that showed 100% A-fib burden with controlled rates, average about 68 bpm. Continue rate control for now.  Goal heart rate is less than 110 bpm and well patients at rest.  We can tolerate even higher rates in patients acutely ill. Continue heparin drip for now.  Resume Eliquis when acute illness is over, and there is very low likelihood of an invasive intervention.  Concern for endocarditis Late systolic prolapse of the mitral valve noted on prior TTE 2019.  Endocarditis cannot be excluded on recent echo.  Will plan for transesophageal echo next week.  PVCs EF is normal, no symptoms.  Continue to monitor Continue beta-blocker  End-stage renal disease on hemodialysis Per nephrology  Staff aureus bacteremia complicated by septis and encephalopathy Per primary   For questions or updates, please contact [facility] Please consult [email or web address] for contact info under Cardiology/STEMI.      Signed, Maurice Small, MD 04/05/2023, 9:56 AM

## 2023-04-05 NOTE — Procedures (Signed)
Radiology Procedure Note  Risks and benefits of joint aspiration were discussed with the patient including, but not limited to bleeding, infection, damage to adjacent structures.  All of the patient's questions were answered, patient is agreeable to proceed. Consent signed and in chart.  A timeout was performed with all members of the team prior to start of the procedure. Correct patient and correct procedure was confirmed. Allergies were reviewed.   PROCEDURE SUMMARY:  Successful fluoro guided right and left shoulder aspiration.  Right shoulder yielded ~3-4 mL of yellow-green fluid. Left shoulder yielded ~1-2 mL of dark yellow fluid. No immediate complications.  Patient tolerated well.   EBL = trace  Please see full dictation in imaging section of Epic for procedure details.   Kennieth Francois PA-C 04/05/2023 1:52 PM

## 2023-04-05 NOTE — Progress Notes (Signed)
ANTICOAGULATION CONSULT NOTE - follow-up  Pharmacy Consult for heparin Indication: atrial fibrillation  Allergies  Allergen Reactions   Doxazosin Mesylate     ineffective for BP (10/2014)   Triamterene-Hctz     increased creatinine (see 09/2014 office note)    Patient Measurements: Height: 5\' 6"  (167.6 cm) Weight: 97.4 kg (214 lb 11.7 oz) IBW/kg (Calculated) : 63.8 Heparin Dosing Weight: 85.6 kg  Vital Signs: Temp: 97.1 F (36.2 C) (07/06 0824) Temp Source: Axillary (07/06 0824) BP: 108/60 (07/06 0824) Pulse Rate: 79 (07/06 0824)  Labs: Recent Labs    04/03/23 0140 04/03/23 0730 04/03/23 2115 04/04/23 0441 04/05/23 0315 04/05/23 1458  HGB 12.1*  --   --  11.7* 12.4*  --   HCT 35.2*  --   --  33.5* 35.4*  --   PLT 87*  --   --  98* 130*  --   APTT 71*   < >  --  78* 61* 98*  HEPARINUNFRC >1.10*  --   --  >1.10* 0.47 0.51  CREATININE  --   --  7.58* 8.18*  --   --    < > = values in this interval not displayed.     Estimated Creatinine Clearance: 8.7 mL/min (A) (by C-G formula based on SCr of 8.18 mg/dL (H)).   Medical History: Past Medical History:  Diagnosis Date   Anemia in chronic kidney disease (CKD)    CKD (chronic kidney disease) stage 5, GFR less than 15 ml/min (HCC)    M-W-F   Diabetes mellitus without complication (HCC) 03/2012   Hx type II - no meds, diet controlled, lost weight   ED (erectile dysfunction)    Heart murmur    not significant   History of kidney stones    passed 1 , 1 was removed with kidney disease in 2010   Hypercholesteremia    Hypertension    Iron deficiency    Kidney stone 2010   MVP (mitral valve prolapse)    Posterior with MR by ECHO   OSA (obstructive sleep apnea)    does not use cpap, lost weight    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   Syncope     Medications:  Medications Prior to Admission  Medication Sig Dispense Refill Last Dose   acetaminophen (TYLENOL) 500 MG tablet Take 1,000  mg by mouth as needed for mild pain or moderate pain.   Past Week   apixaban (ELIQUIS) 5 MG TABS tablet Take 1 tablet (5 mg total) by mouth 2 (two) times daily. 60 tablet 11 Past Week at 22:00   atorvastatin (LIPITOR) 10 MG tablet Take 10 mg by mouth at bedtime.   Past Week   AURYXIA 1 GM 210 MG(Fe) tablet Take 420-630 mg by mouth See admin instructions. Take 630mg  by mouth three times daily with meals and take 420mg  by mouth with snacks.   Past Week   VITAMIN D, CHOLECALCIFEROL, PO Take 1 tablet by mouth once a week. Patient had Dialysis on Monday, Wednesday, and Friday.      Scheduled:   (feeding supplement) PROSource Plus  30 mL Oral BID BM   atorvastatin  10 mg Oral QHS   bupivacaine(PF)  10 mL Infiltration Once   Chlorhexidine Gluconate Cloth  6 each Topical Daily   Chlorhexidine Gluconate Cloth  6 each Topical Q0600   cinacalcet  180 mg Oral Q breakfast   ferric citrate  630 mg Oral TID WC  metoprolol tartrate  12.5 mg Oral BID   multivitamin  1 tablet Oral QHS   pantoprazole (PROTONIX) IV  40 mg Intravenous Q12H    Assessment: 72 YOM w/ prior history of AF requiring heparin gtt. Last dose of apixaban given ~10 AM on 04/02/2023  Heparin level 0.51 and aPTT 98, levels correlating. No issues with infusion or bleeding reported. CBC stable.  Patient had fluoro guided right and left shoulder aspiration today, note states EBL = trace.   Goal of Therapy:  Heparin level 0.3-0.7 units/ml aPTT 66-102 seconds Monitor platelets by anticoagulation protocol: Yes   Plan:  Continue heparin infusion at 1350 units/hr Check anti-Xa level daily while on heparin Continue to monitor H&H and platelets    Thank you for allowing pharmacy to be a part of this patient's care.   Signe Colt, PharmD 04/05/2023 4:43 PM  **Pharmacist phone directory can be found on [email or web address] listed under [facility]**

## 2023-04-06 ENCOUNTER — Encounter (HOSPITAL_COMMUNITY): Payer: Self-pay | Admitting: Certified Registered Nurse Anesthetist

## 2023-04-06 DIAGNOSIS — J690 Pneumonitis due to inhalation of food and vomit: Secondary | ICD-10-CM | POA: Diagnosis not present

## 2023-04-06 DIAGNOSIS — M00031 Staphylococcal arthritis, right wrist: Secondary | ICD-10-CM | POA: Diagnosis not present

## 2023-04-06 DIAGNOSIS — N186 End stage renal disease: Secondary | ICD-10-CM | POA: Diagnosis not present

## 2023-04-06 DIAGNOSIS — A419 Sepsis, unspecified organism: Secondary | ICD-10-CM | POA: Insufficient documentation

## 2023-04-06 DIAGNOSIS — M00061 Staphylococcal arthritis, right knee: Secondary | ICD-10-CM | POA: Diagnosis not present

## 2023-04-06 DIAGNOSIS — M00012 Staphylococcal arthritis, left shoulder: Secondary | ICD-10-CM | POA: Diagnosis not present

## 2023-04-06 DIAGNOSIS — Z992 Dependence on renal dialysis: Secondary | ICD-10-CM

## 2023-04-06 DIAGNOSIS — B9561 Methicillin susceptible Staphylococcus aureus infection as the cause of diseases classified elsewhere: Secondary | ICD-10-CM | POA: Diagnosis not present

## 2023-04-06 LAB — CULTURE, BLOOD (ROUTINE X 2): Culture: NO GROWTH

## 2023-04-06 LAB — CBC WITH DIFFERENTIAL/PLATELET
Abs Immature Granulocytes: 1.35 10*3/uL — ABNORMAL HIGH (ref 0.00–0.07)
Basophils Absolute: 0.1 10*3/uL (ref 0.0–0.1)
Basophils Relative: 1 %
Eosinophils Absolute: 0.2 10*3/uL (ref 0.0–0.5)
Eosinophils Relative: 1 %
HCT: 35 % — ABNORMAL LOW (ref 39.0–52.0)
Hemoglobin: 12.6 g/dL — ABNORMAL LOW (ref 13.0–17.0)
Immature Granulocytes: 9 %
Lymphocytes Relative: 8 %
Lymphs Abs: 1.2 10*3/uL (ref 0.7–4.0)
MCH: 33.9 pg (ref 26.0–34.0)
MCHC: 36 g/dL (ref 30.0–36.0)
MCV: 94.1 fL (ref 80.0–100.0)
Monocytes Absolute: 0.7 10*3/uL (ref 0.1–1.0)
Monocytes Relative: 5 %
Neutro Abs: 11.6 10*3/uL — ABNORMAL HIGH (ref 1.7–7.7)
Neutrophils Relative %: 76 %
Platelets: 185 10*3/uL (ref 150–400)
RBC: 3.72 MIL/uL — ABNORMAL LOW (ref 4.22–5.81)
RDW: 14.6 % (ref 11.5–15.5)
WBC: 15.1 10*3/uL — ABNORMAL HIGH (ref 4.0–10.5)
nRBC: 0.4 % — ABNORMAL HIGH (ref 0.0–0.2)

## 2023-04-06 LAB — COMPREHENSIVE METABOLIC PANEL
ALT: 40 U/L (ref 0–44)
AST: 89 U/L — ABNORMAL HIGH (ref 15–41)
Albumin: <1.5 g/dL — ABNORMAL LOW (ref 3.5–5.0)
Alkaline Phosphatase: 80 U/L (ref 38–126)
Anion gap: 18 — ABNORMAL HIGH (ref 5–15)
BUN: 77 mg/dL — ABNORMAL HIGH (ref 8–23)
CO2: 21 mmol/L — ABNORMAL LOW (ref 22–32)
Calcium: 7.3 mg/dL — ABNORMAL LOW (ref 8.9–10.3)
Chloride: 90 mmol/L — ABNORMAL LOW (ref 98–111)
Creatinine, Ser: 7.17 mg/dL — ABNORMAL HIGH (ref 0.61–1.24)
GFR, Estimated: 7 mL/min — ABNORMAL LOW (ref >60)
Glucose, Bld: 64 mg/dL — ABNORMAL LOW (ref 70–99)
Potassium: 5.3 mmol/L — ABNORMAL HIGH (ref 3.5–5.1)
Sodium: 129 mmol/L — ABNORMAL LOW (ref 135–145)
Total Bilirubin: 9.8 mg/dL — ABNORMAL HIGH (ref 0.3–1.2)
Total Protein: 6.6 g/dL (ref 6.5–8.1)

## 2023-04-06 LAB — HEPARIN LEVEL (UNFRACTIONATED): Heparin Unfractionated: 0.41 IU/mL (ref 0.30–0.70)

## 2023-04-06 LAB — BRAIN NATRIURETIC PEPTIDE: B Natriuretic Peptide: 183.4 pg/mL — ABNORMAL HIGH (ref 0.0–100.0)

## 2023-04-06 LAB — TSH: TSH: 1.04 u[IU]/mL (ref 0.350–4.500)

## 2023-04-06 LAB — AEROBIC/ANAEROBIC CULTURE W GRAM STAIN (SURGICAL/DEEP WOUND)

## 2023-04-06 LAB — PROCALCITONIN: Procalcitonin: 18.04 ng/mL

## 2023-04-06 LAB — C-REACTIVE PROTEIN: CRP: 25.1 mg/dL — ABNORMAL HIGH (ref <1.0)

## 2023-04-06 LAB — CORTISOL: Cortisol, Plasma: 31.6 ug/dL

## 2023-04-06 LAB — PROTIME-INR
INR: 1.7 — ABNORMAL HIGH (ref 0.8–1.2)
Prothrombin Time: 19.9 seconds — ABNORMAL HIGH (ref 11.4–15.2)

## 2023-04-06 LAB — GLUCOSE, CAPILLARY: Glucose-Capillary: 112 mg/dL — ABNORMAL HIGH (ref 70–99)

## 2023-04-06 LAB — APTT: aPTT: 169 seconds (ref 24–36)

## 2023-04-06 MED ORDER — LACTATED RINGERS IV SOLN: INTRAVENOUS | Status: AC

## 2023-04-06 MED ORDER — CHLORHEXIDINE GLUCONATE 4 % EX SOLN: 60.0000 mL | Freq: Once | CUTANEOUS | Status: DC

## 2023-04-06 MED ORDER — TOBRAMYCIN SULFATE 80 MG/2ML IJ SOLN
80.0000 mg | Freq: Once | INTRAVENOUS | Status: AC
  Administered 2023-04-06: 80 mg via INTRA_ARTICULAR
  Filled 2023-04-06: qty 2

## 2023-04-06 MED ORDER — POVIDONE-IODINE 10 % EX SWAB
2.0000 | Freq: Once | CUTANEOUS | Status: AC
  Administered 2023-04-07: 2 via TOPICAL

## 2023-04-06 MED ORDER — LACTATED RINGERS IV BOLUS
250.0000 mL | INTRAVENOUS | Status: AC
  Administered 2023-04-06: 250 mL via INTRAVENOUS

## 2023-04-06 MED ORDER — MIDODRINE HCL 5 MG PO TABS
10.0000 mg | ORAL_TABLET | Freq: Three times a day (TID) | ORAL | Status: DC
  Administered 2023-04-06 – 2023-04-08 (×7): 10 mg via ORAL
  Filled 2023-04-06 (×8): qty 2

## 2023-04-06 NOTE — Plan of Care (Signed)
  Problem: Education: Goal: Knowledge of General Education information will improve Description: Including pain rating scale, medication(s)/side effects and non-pharmacologic comfort measures Outcome: Progressing   Problem: Clinical Measurements: Goal: Ability to maintain clinical measurements within normal limits will improve Outcome: Progressing Note: BP low normal but stable Goal: Diagnostic test results will improve Outcome: Progressing Goal: Respiratory complications will improve Outcome: Progressing Goal: Cardiovascular complication will be avoided Outcome: Progressing   Problem: Activity: Goal: Risk for activity intolerance will decrease Outcome: Not Progressing   Problem: Nutrition: Goal: Adequate nutrition will be maintained Outcome: Progressing   Problem: Coping: Goal: Level of anxiety will decrease Outcome: Progressing   Problem: Elimination: Goal: Will not experience complications related to bowel motility Outcome: Progressing Goal: Will not experience complications related to urinary retention Outcome: Not Applicable   Problem: Pain Managment: Goal: General experience of comfort will improve Outcome: Progressing   Problem: Safety: Goal: Ability to remain free from injury will improve Outcome: Progressing   Problem: Skin Integrity: Goal: Risk for impaired skin integrity will decrease Outcome: Not Progressing Note: Pt with new deep tissue injury   Problem: Fluid Volume: Goal: Hemodynamic stability will improve Outcome: Progressing   Problem: Clinical Measurements: Goal: Diagnostic test results will improve Outcome: Progressing Goal: Signs and symptoms of infection will decrease Outcome: Progressing   Problem: Respiratory: Goal: Ability to maintain adequate ventilation will improve Outcome: Completed/Met

## 2023-04-06 NOTE — Progress Notes (Signed)
PIV consult: RUE restricted due to HD access. LUE assessed with ultrasound. Vessels found are too deep for PIV cannulation. Please consider central line for incompatible meds.

## 2023-04-06 NOTE — Progress Notes (Signed)
Peter [name] Progress Note    Assessment/ Plan: Pt is a 72 y.o. yo male with ESRD, T2DM, HTN, OSA, A-fib (on Eliquis), HL who was admitted with pneumonia.    Dialysis Orders:  MWF at [facility] 4hr, 450/800, EDW 97.5kg, 3K/2Ca, AVG, 15g, heparin 10,000 bolus - no ESA, Hgb > 12 - No VDRA (recently stopped d/t Ca 10.9)   # Sepsis due to aspiration pneumonia, septic joints: Currently on antibiotics, requiring oxygenation. Per primary team.  # MSSA bacteremia: 1st set Blood Cx 7/2 positive w/ aspirate from knee staph aureus.  AVG without signs of infection. ID following, remains nafcillin. Strong suspicion for endocarditis. Plan for TEE next week.   # Shoulder/Knee effusion.  R knee aspirate with ^ WBC: Likely septic arthritis. Also concern for shoulder effusion. Ortho following, likely washout of joints at some point    # ESRD: Continue HD on usual MWF schedule.  Next HD 7/8.    # Anemia of CKD: Hemoglobin above goal.  No need for ESA.   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  Continue Sensipar and Auryxia.  Monitor lab.   # HTN/volume: BP okay.  Off of antihypertensives. Does have edema.  # Elevated LFTs  # A-Fib  Subjective:  Had shoulder/knee aspiration yesterday. Was to go to OR for washout, but holding d/t low Bps today. He's actually feeling better than he has in the last few days. Just "restless"   Objective Vitals:   04/06/23 0800 04/06/23 0840 04/06/23 0930 04/06/23 0944  BP: (!) 99/59 (!) 102/53 (!) 84/42 (!) 118/59  Pulse: 84 90  93  Resp: (!) 22 (!) 23  (!) 22  Temp:      TempSrc:      SpO2: 94% 92%  93%  Weight:      Height:       Physical Exam General: Ill appearing man, awake and conversant Heart:Irregularly irregular Lungs: CTA anteriorly, poor effort Abdomen: soft Extremities: 1+ BLE edema; 1+ RUE edema Dialysis Access: R AVG + bruit  Additional Objective Labs: Basic Metabolic Panel: Recent Labs  Lab 04/02/23 0555  04/03/23 2115 04/04/23 0441 04/06/23 0815  NA 131* 130* 127* 129*  K 5.2* 4.4 4.1 5.3*  CL 90* 87* 89* 90*  CO2 21* 25 24 21*  GLUCOSE 117* 143* 108* 64*  BUN 58* 68* 77* 77*  CREATININE 7.96* 7.58* 8.18* 7.17*  CALCIUM 9.1 8.7* 8.2* 7.3*  PHOS 4.7*  --   --   --     Liver Function Tests: Recent Labs  Lab 04/03/23 1058 04/04/23 0441 04/06/23 0815  AST 158* 230* 89*  ALT 76* 92* 40  ALKPHOS 45 48 80  BILITOT 1.3* 3.4* 9.8*  PROT 6.5 5.9* 6.6  ALBUMIN 1.9* 1.7* <1.5*    CBC: Recent Labs  Lab 04/02/23 0340 04/03/23 0140 04/04/23 0441 04/05/23 0315 04/06/23 0511  WBC 9.0 8.8 8.8 10.3 15.1*  NEUTROABS 7.5 7.2 6.7 7.3 11.6*  HGB 12.9* 12.1* 11.7* 12.4* 12.6*  HCT 37.0* 35.2* 33.5* 35.4* 35.0*  MCV 97.4 97.2 96.3 95.4 94.1  PLT 77* 87* 98* 130* 185    Blood Culture    Component Value Date/Time   SDES FLUID 04/05/2023 1101   SPECREQUEST JOINT, RIGHT SHOULDER 04/05/2023 1101   CULT PENDING 04/05/2023 1101   REPTSTATUS PENDING 04/05/2023 1101   Studies/Results: DG FL ASP/INJ MAJOR (SHOULDER, HIP, KNEE)  Result Date: 04/05/2023 INDICATION: Right shoulder pain.  Concern for septic joint. EXAM: ASPIRATION OF  LARGE JOINT COMPARISON:  MR shoulder right 04/04/2023 CONTRAST:  None FLUOROSCOPY: 1.30 mGy Kerma COMPLICATIONS: None immediate PROCEDURE: Informed written consent was obtained from the patient after discussion of the risks, benefits and alternatives to treatment. The patient was placed supine on the fluoroscopy table. The right was localized with fluoroscopy. The skin overlying the anterior aspect of the right shoulder was prepped and draped in usual sterile fashion. Approximately 3 mL of 1% lidocaine was used to anesthetize the skin. A 22 gauge spinal needle was advanced into the right shoulder joint at the medial superior aspect of the humeral head and an image was saved for documentation purposes. Approximally 3-4 mL of yellow-green fluid was aspirated. The syringe  was capped and sent to the laboratory for analysis as ordered by the clinical team. The needle was removed and a dressing was placed. The patient tolerated procedure well without immediate postprocedural complication. IMPRESSION: Successful fluoroscopic guided aspiration of the right shoulder. Procedure performed by: Mina Marble, PA-C Electronically Signed   By: Genevive Bi M.D.   On: 04/05/2023 14:45   DG FL ASP/INJ MAJOR (SHOULDER, HIP, KNEE)  Result Date: 04/05/2023 INDICATION: Left shoulder pain.  Concern for septic joint. EXAM: ARTHROCENTESIS/INJECTION OF LARGE JOINT COMPARISON:  MR shoulder left 04/03/2023 CONTRAST:  None FLUOROSCOPY: 1.70 mGy Kerma COMPLICATIONS: None immediate PROCEDURE: Informed written consent was obtained from the patient after discussion of the risks, benefits and alternatives to treatment. The patient was placed supine on the fluoroscopy table. The left was localized with fluoroscopy. The skin overlying the anterior aspect of the left shoulder was prepped and draped in usual sterile fashion. Approximately 3 mL of 1% lidocaine was used to anesthetize the skin. A 22 gauge spinal needle was advanced into the left shoulder joint at the medial superior aspect of the humeral head and an image was saved for documentation purposes. Approximally 1 mL of dark yellow fluid was aspirated. The syringe was capped and sent to the laboratory for analysis as ordered by the clinical team. The needle was removed and a dressing was placed. The patient tolerated procedure well without immediate postprocedural complication. IMPRESSION: Successful fluoroscopic guided aspiration of the left shoulder. Procedure performed by: Mina Marble, PA-C Electronically Signed   By: Genevive Bi M.D.   On: 04/05/2023 14:34   MR SHOULDER RIGHT WO CONTRAST  Result Date: 04/05/2023 CLINICAL DATA:  Septic arthritis suspected, shoulder, xray done EXAM: MRI OF THE RIGHT SHOULDER WITHOUT CONTRAST TECHNIQUE:  Multiplanar, multisequence MR imaging of the shoulder was performed. No intravenous contrast was administered. COMPARISON:  None Available. FINDINGS: Rotator cuff: Complete full-thickness tears of the supraspinatus, infraspinatus, and subscapularis tendons. The supraspinatus and subscapularis tendons are retracted to the level of the glenohumeral joint. Intact teres minor. Muscles: Advanced rotator cuff muscle atrophy and fatty infiltration. Biceps long head:  Not visualized, torn and retracted. Acromioclavicular Joint: Moderate degenerative changes of the AC joint. Moderate volume subacromial-subdeltoid bursal fluid communicating with the glenohumeral joint. Glenohumeral Joint: Large glenohumeral joint effusion. Diffuse chondral thinning with multifocal areas of full-thickness fissuring along both sides of the joint. Labrum:  Diffusely degenerated. Bones: No acute fracture. No dislocation. No bone marrow edema. No erosion is seen. No suspicious marrow replacing bone lesion. Other: None. IMPRESSION: 1. Large glenohumeral joint effusion, nonspecific. Arthrocentesis with joint fluid analysis is recommended to exclude the possibility of septic arthritis. 2. Complete full-thickness tears of the supraspinatus, infraspinatus, and subscapularis tendons with advanced rotator cuff muscle atrophy and fatty infiltration. 3. Torn and retracted  long head of the biceps tendon. 4. Moderate glenohumeral and AC joint osteoarthritis. Electronically Signed   By: Duanne Guess D.O.   On: 04/05/2023 09:11   DG Wrist Complete Right  Result Date: 04/04/2023 CLINICAL DATA:  Recent fall with right wrist pain, initial encounter EXAM: RIGHT WRIST - COMPLETE 3+ VIEW COMPARISON:  CT of the wrist obtained on the same day. 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 No acute fracture or dislocation is noted. Vascular calcifications are noted as well as mild soft tissue swelling. No acute abnormality seen.  IMPRESSION: No acute abnormality noted. Electronically Signed   By: Alcide Clever M.D.   On: 04/04/2023 19:11   CT WRIST RIGHT W CONTRAST  Result Date: 04/04/2023 CLINICAL DATA:  Evaluate for septic joint/effusion. Unable to tolerate MRI. EXAM: CT OF THE UPPER RIGHT EXTREMITY WITH CONTRAST TECHNIQUE: Multidetector CT imaging of the upper right extremity was performed according to the standard protocol following intravenous contrast administration. RADIATION DOSE REDUCTION: This exam was performed according to the departmental dose-optimization program which includes automated exposure control, adjustment of the mA and/or kV according to patient size and/or use of iterative reconstruction technique. CONTRAST:  75mL OMNIPAQUE IOHEXOL 350 MG/ML SOLN COMPARISON:  None Available. FINDINGS: Patient scanned with arms down positioning which limits detailed osseous and soft tissue assessment due to soft tissue attenuation from habitus. Bones/Joint/Cartilage No fracture. Possible remote triquetral fracture. No erosive change. There may be a small distal radioulnar joint effusion, series 5, image 62. Suspected small radiocarpal joint effusion sagittal series 11, image 66. This is difficult to accurately assess on the current exam. Ligaments Suboptimally assessed by CT. Muscles and Tendons No obvious intramuscular collection. Limited assessment for tenosynovial fluid. Soft tissues No soft tissue subcutaneous collection. Mild edema is noted about the ulnar aspect of the wrist and distal forearm. Incidental note of edema in the subcutaneous tissues about the right flank, partially included in this field of view. IMPRESSION: 1. Patient scanned with arms down positioning which limits detailed osseous and soft tissue assessment due to soft tissue attenuation from habitus. 2. There may be a small distal radioulnar joint effusion. Suspected small radiocarpal joint effusion. This is difficult to accurately assess on the current exam.  No gross CT findings of osteomyelitis. 3. Mild edema about the ulnar aspect of the wrist and distal forearm. No soft tissue collection. Electronically Signed   By: Narda Rutherford M.D.   On: 04/04/2023 17:06   Medications:  heparin Stopped (04/06/23 0552)   lactated ringers 75 mL/hr at 04/06/23 0553   nafcillin IV 2 g (04/06/23 0827)    (feeding supplement) PROSource Plus  30 mL Oral BID BM   atorvastatin  10 mg Oral QHS   bupivacaine(PF)  10 mL Infiltration Once   chlorhexidine  60 mL Topical Once   Chlorhexidine Gluconate Cloth  6 each Topical Q0600   cinacalcet  180 mg Oral Q breakfast   ferric citrate  630 mg Oral TID WC   metoprolol tartrate  12.5 mg Oral BID   midodrine  10 mg Oral TID WC   multivitamin  1 tablet Oral QHS   pantoprazole (PROTONIX) IV  40 mg Intravenous Q12H   povidone-iodine  2 Application Topical Once     Tomasa Blase PA-C [name] 04/06/2023,10:01 AM

## 2023-04-06 NOTE — Progress Notes (Signed)
ANTICOAGULATION CONSULT NOTE - follow-up  Pharmacy Consult for heparin Indication: atrial fibrillation  Allergies  Allergen Reactions   Doxazosin Mesylate     ineffective for BP (10/2014)   Triamterene-Hctz     increased creatinine (see 09/2014 office note)    Patient Measurements: Height: 5\' 6"  (167.6 cm) Weight: 97.4 kg (214 lb 11.7 oz) IBW/kg (Calculated) : 63.8 Heparin Dosing Weight: 85.6 kg  Vital Signs: Temp: 97.7 F (36.5 C) (07/07 2000) Temp Source: Oral (07/07 2000) BP: 102/63 (07/07 2100) Pulse Rate: 68 (07/07 2100)  Labs: Recent Labs    04/04/23 0441 04/05/23 0315 04/05/23 1458 04/06/23 0511 04/06/23 0815 04/06/23 1948  HGB 11.7* 12.4*  --  12.6*  --   --   HCT 33.5* 35.4*  --  35.0*  --   --   PLT 98* 130*  --  185  --   --   APTT 78* 61* 98*  --   --  169*  LABPROT  --   --   --  19.9*  --   --   INR  --   --   --  1.7*  --   --   HEPARINUNFRC >1.10* 0.47 0.51 0.41  --   --   CREATININE 8.18*  --   --   --  7.17*  --      Estimated Creatinine Clearance: 9.9 mL/min (A) (by C-G formula based on SCr of 7.17 mg/dL (H)).   Assessment: 49 YOM w/ prior history of AF requiring heparin gtt. Last dose of apixaban given ~10 AM on 04/02/2023. Patient had fluoro guided right and left shoulder aspiration on 7/6, note states EBL = trace.  Due to tbili 9.8 interacting with heparin level assay, aPTT was drawn tonight. aPTT was elevated to 169. Heparin is running in L arm and RN thinks lab drew from down near the hand. There is likely some contamination and not sure how accurate this level is. RN will try to hold heparin a few minutes prior to draw in a.m.  Goal of Therapy:  Heparin level 0.3-0.7 units/ml aPTT 66-102 seconds Monitor platelets by anticoagulation protocol: Yes   Plan:  Decrease heparin to 1250 units/hr aPTT and heparin level in 8 hours Will f/u tbili in a.m. - hopefully down so heparin levels can be utilized  Christoper Fabian, PharmD, BCPS Please see  amion for complete clinical pharmacist phone list 04/06/2023 9:29 PM

## 2023-04-06 NOTE — Progress Notes (Signed)
ANTICOAGULATION CONSULT NOTE - follow-up  Pharmacy Consult for heparin Indication: atrial fibrillation  Allergies  Allergen Reactions   Doxazosin Mesylate     ineffective for BP (10/2014)   Triamterene-Hctz     increased creatinine (see 09/2014 office note)    Patient Measurements: Height: 5\' 6"  (167.6 cm) Weight: 97.4 kg (214 lb 11.7 oz) IBW/kg (Calculated) : 63.8 Heparin Dosing Weight: 85.6 kg  Vital Signs: Temp: 98.2 F (36.8 C) (07/07 0340) Temp Source: Oral (07/07 0340) BP: 107/83 (07/07 1000) Pulse Rate: 81 (07/07 1000)  Labs: Recent Labs    04/03/23 2115 04/04/23 0441 04/04/23 0441 04/05/23 0315 04/05/23 1458 04/06/23 0511 04/06/23 0815  HGB  --  11.7*   < > 12.4*  --  12.6*  --   HCT  --  33.5*  --  35.4*  --  35.0*  --   PLT  --  98*  --  130*  --  185  --   APTT  --  78*  --  61* 98*  --   --   LABPROT  --   --   --   --   --  19.9*  --   INR  --   --   --   --   --  1.7*  --   HEPARINUNFRC  --  >1.10*   < > 0.47 0.51 0.41  --   CREATININE 7.58* 8.18*  --   --   --   --  7.17*   < > = values in this interval not displayed.     Estimated Creatinine Clearance: 9.9 mL/min (A) (by C-G formula based on SCr of 7.17 mg/dL (H)).   Medical History: Past Medical History:  Diagnosis Date   Anemia in chronic kidney disease (CKD)    CKD (chronic kidney disease) stage 5, GFR less than 15 ml/min (HCC)    M-W-F   Diabetes mellitus without complication (HCC) 03/2012   Hx type II - no meds, diet controlled, lost weight   ED (erectile dysfunction)    Heart murmur    not significant   History of kidney stones    passed 1 , 1 was removed with kidney disease in 2010   Hypercholesteremia    Hypertension    Iron deficiency    Kidney stone 2010   MVP (mitral valve prolapse)    Posterior with MR by ECHO   OSA (obstructive sleep apnea)    does not use cpap, lost weight    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   Syncope     Medications:   Medications Prior to Admission  Medication Sig Dispense Refill Last Dose   acetaminophen (TYLENOL) 500 MG tablet Take 1,000 mg by mouth as needed for mild pain or moderate pain.   Past Week   apixaban (ELIQUIS) 5 MG TABS tablet Take 1 tablet (5 mg total) by mouth 2 (two) times daily. 60 tablet 11 Past Week at 22:00   atorvastatin (LIPITOR) 10 MG tablet Take 10 mg by mouth at bedtime.   Past Week   AURYXIA 1 GM 210 MG(Fe) tablet Take 420-630 mg by mouth See admin instructions. Take 630mg  by mouth three times daily with meals and take 420mg  by mouth with snacks.   Past Week   VITAMIN D, CHOLECALCIFEROL, PO Take 1 tablet by mouth once a week. Patient had Dialysis on Monday, Wednesday, and Friday.      Scheduled:   (feeding supplement)  PROSource Plus  30 mL Oral BID BM   atorvastatin  10 mg Oral QHS   bupivacaine(PF)  10 mL Infiltration Once   chlorhexidine  60 mL Topical Once   Chlorhexidine Gluconate Cloth  6 each Topical Q0600   cinacalcet  180 mg Oral Q breakfast   ferric citrate  630 mg Oral TID WC   metoprolol tartrate  12.5 mg Oral BID   midodrine  10 mg Oral TID WC   multivitamin  1 tablet Oral QHS   pantoprazole (PROTONIX) IV  40 mg Intravenous Q12H   povidone-iodine  2 Application Topical Once    Assessment: 62 YOM w/ prior history of AF requiring heparin gtt. Last dose of apixaban given ~10 AM on 04/02/2023. Patient had fluoro guided right and left shoulder aspiration on 7/6, note states EBL = trace.  Heparin level 0.41, therapeutic. Heparin drip turned off this morning around 6 am for procedure. Will follow up with nurse to reinitiate previous therapeutic rate when appropriate.      Goal of Therapy:  Heparin level 0.3-0.7 units/ml aPTT 66-102 seconds Monitor platelets by anticoagulation protocol: Yes   Plan:  Will follow up with team to restart  Check anti-Xa level daily while on heparin Continue to monitor H&H and platelets    Thank you for allowing pharmacy to be a  part of this patient's care.   Gwynn Burly PharmD 04/06/2023 10:58 AM  **Pharmacist phone directory can be found on [email or web address] listed under [facility]**

## 2023-04-06 NOTE — Progress Notes (Signed)
NAME:  Peter [name], MRN:  [id number], DOB:  02-02-1951, LOS: 5 ADMISSION DATE:  04/01/2023, CONSULTATION DATE:  04/01/2023 REFERRING MD:  Dr. Thedore Mins - [facility], CHIEF COMPLAINT:   weakness/ fall/ NV   History of Present Illness:  42 yoM with PMH as below, significant for ESRD on MWF iHD via RUE AVF, afib on Eliquis, T2DM, anemia of CKD, HTN, HL, and OSA not on CPAP who presented with weakness from home. Started not to feel well on Sunday, 6/30 with nausea and vomiting. Sustained a fall, onto right knee and left shoulder pain. Admitted to [facility] on 7/2 for further workup with suspected sepsis, febrile 102.3 on admit. Blood cultures positive for MSSA bacteremia, source unclear at this time, being treated additionally for aspiration pneumonia, and mild transaminitis. Nephrology, ID, GI, and palliative care following. Ortho consulted and underwent aspiration of right knee showing many WBC, primarily PMNs. He underwent iHD 7/3 with 1.4L removed 7/3. TTE pending. Overnight, had intermittent low blood pressure episodes and patient is more sonolucent. Given concern for worsening sepsis, PCCM consulted.   Pertinent  Medical History  ESRD on MWF iHD via RUE AVF, afib on Eliquis, T2DM, anemia of CKD, HTN, HL, and OSA not on CPAP   Significant Hospital Events: Including procedures, antibiotic start and stop dates in addition to other pertinent events   7/2 admitted  7/3 iHD, right knee joint asp  Interim History / Subjective:  Seen lying in bed with no acute complaints, asking when/if orthopedic surgery will take place   Objective   Blood pressure 107/83, pulse 81, temperature 98.2 F (36.8 C), temperature source Oral, resp. rate 19, height 5\' 6"  (1.676 m), weight 97.4 kg, SpO2 94 %.       No intake or output data in the 24 hours ending 04/06/23 1122 Filed Weights   04/02/23 1908 04/04/23 1029 04/04/23 1506  Weight: 98 kg 98.8 kg 97.4 kg    Examination: General: Acute on chronically ill appearing  elderly male lying in bed, in NAD HEENT: [place]/AT, MM pink/moist, PERRL,  Neuro: Alert and oriented x3 CV: s1s2 regular rate and rhythm, no murmur, rubs, or gallops,  PULM:  Clear to ascultation, no increased work of breathing, no added breath sounds  GI: soft, bowel sounds active in all 4 quadrants, non-tender, non-distended, tolerating oral diet Extremities: warm/dry, no edema  Skin: no rashes or lesions   Resolved Hospital Problem list   Toxic/ metabolic encephalopathy  Assessment & Plan:  Sepsis with MSSA bacteremia with 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 including bilateral shoulders, possible right knee and right wrist -Source unclear, possible right knee joint vs disseminated infection, r/o IE P: Transfer to ICU  May need to establish A-line  ID following, appreciate assistance Continue IV Orthopedic following for possible joint washout to achieve source control Transfer to ICU for close monitoring given hypotension  Aspiration PNA OSA- untreated  P: Continue antibiotics as above Continue supplemental oxygen for sat goal greater than 92 Encourage pulmonary hygiene Mobilize as able Aspiration precautions As needed BiPAP/CPAP Confirmed patient is a full code and would want all aggressive interventions including ventilator support if needed  ESRD on IHD MWF Anion gap metabolic acidosis P: Nephrology following, appreciate assistance IHD per nephrology May need to transition to CRRT given poor hemodynamics in setting of sepsis, no urgent need for dialysis currently  PAF  P: Continuous telemetry Optimize electrolytes  Hyponatremia Hyperkalemia P: Trend Bmet  PRN lokelma  Pending iHD 7/8  Protein  calorie malnutrition Hypoalbuminemia Physical deconditioning Fall -Albumin less than 1.5 P: Optimize nutrition   Best Practice (right click and "Reselect all SmartList Selections" daily)   Diet/type: NPO DVT prophylaxis: SCD GI  prophylaxis: PPI Lines: N/A Foley:  N/A Code Status:  full code Last date of multidisciplinary goals of care discussion: Full code with all aggressive interventions per patient   Labs   CBC: Recent Labs  Lab 04/02/23 0340 04/03/23 0140 04/04/23 0441 04/05/23 0315 04/06/23 0511  WBC 9.0 8.8 8.8 10.3 15.1*  NEUTROABS 7.5 7.2 6.7 7.3 11.6*  HGB 12.9* 12.1* 11.7* 12.4* 12.6*  HCT 37.0* 35.2* 33.5* 35.4* 35.0*  MCV 97.4 97.2 96.3 95.4 94.1  PLT 77* 87* 98* 130* 185    Basic Metabolic Panel: Recent Labs  Lab 04/01/23 1447 04/02/23 0555 04/03/23 2115 04/04/23 0441 04/06/23 0815  NA 127* 131* 130* 127* 129*  K 5.6* 5.2* 4.4 4.1 5.3*  CL 93* 90* 87* 89* 90*  CO2 14* 21* 25 24 21*  GLUCOSE 125* 117* 143* 108* 64*  BUN 104* 58* 68* 77* 77*  CREATININE 13.58* 7.96* 7.58* 8.18* 7.17*  CALCIUM 9.1 9.1 8.7* 8.2* 7.3*  MG  --   --  2.2 2.3  --   PHOS  --  4.7*  --   --   --    GFR: Estimated Creatinine Clearance: 9.9 mL/min (A) (by C-G formula based on SCr of 7.17 mg/dL (H)). Recent Labs  Lab 04/01/23 0851 04/01/23 1448 04/01/23 2315 04/02/23 0340 04/03/23 0140 04/04/23 0441 04/05/23 0315 04/06/23 0511  PROCALCITON  --   --   --    < > 38.83 32.52 23.82 18.04  WBC 13.8*  --   --    < > 8.8 8.8 10.3 15.1*  LATICACIDVEN 4.6* 4.0* 3.9*  --   --   --   --   --    < > = values in this interval not displayed.    Liver Function Tests: Recent Labs  Lab 04/01/23 0851 04/01/23 1447 04/02/23 0555 04/03/23 1058 04/04/23 0441 04/06/23 0815  AST 147* 172*  --  158* 230* 89*  ALT 56* 65*  --  76* 92* 40  ALKPHOS 47 39  --  45 48 80  BILITOT 1.1 1.0  --  1.3* 3.4* 9.8*  PROT 7.4 6.6  --  6.5 5.9* 6.6  ALBUMIN 2.8* 2.5* 2.3* 1.9* 1.7* <1.5*   No results for input(s): "LIPASE", "AMYLASE" in the last 168 hours. Recent Labs  Lab 04/02/23 0340 04/03/23 1058  AMMONIA 28 20    ABG    Component Value Date/Time   HCO3 29.6 (H) 04/03/2023 1053   TCO2 28 01/16/2023 0820    O2SAT 85.2 04/03/2023 1053     Coagulation Profile: Recent Labs  Lab 04/01/23 0851 04/06/23 0511  INR 1.6* 1.7*    Cardiac Enzymes: No results for input(s): "CKTOTAL", "CKMB", "CKMBINDEX", "TROPONINI" in the last 168 hours.  HbA1C: Hgb A1c MFr Bld  Date/Time Value Ref Range Status  05/30/2018 02:49 AM 5.7 (H) 4.8 - 5.6 % Final    Comment:    (NOTE) Pre diabetes:          5.7%-6.4% Diabetes:              >6.4% Glycemic control for   <7.0% adults with diabetes     CBG: No results for input(s): "GLUCAP" in the last 168 hours.  Review of Systems:   Please see the history of present illness.  All other systems reviewed and are negative   Past Medical History:  He,  has a past medical history of Anemia in chronic kidney disease (CKD), CKD (chronic kidney disease) stage 5, GFR less than 15 ml/min (HCC), Diabetes mellitus without complication (HCC) (03/2012), ED (erectile dysfunction), Heart murmur, History of kidney stones, Hypercholesteremia, Hypertension, Iron deficiency, Kidney stone (2010), MVP (mitral valve prolapse), OSA (obstructive sleep apnea), Renal oncocytoma of left kidney (2010), and Syncope.   Surgical History:   Past Surgical History:  Procedure Laterality Date   A/V FISTULAGRAM Left 02/10/2018   Procedure: A/V FISTULAGRAM;  Surgeon: Nada Libman, MD;  Location: [facility];  Service: Cardiovascular;  Laterality: Left;   APPLICATION OF WOUND VAC Right 03/15/2021   Procedure: APPLICATION OF WOUND VAC;  Surgeon: Leonie Douglas, MD;  Location: [facility] OR;  Service: Vascular;  Laterality: Right;   AV FISTULA PLACEMENT Left 06/14/2016   Procedure: ARTERIOVENOUS (AV) FISTULA CREATION LEFT ARM;  Surgeon: Chuck Hint, MD;  Location: [facility] OR;  Service: Vascular;  Laterality: Left;   AV FISTULA PLACEMENT Right 06/09/2018   Procedure: CREATION Right arm Brachiocephalic;  Surgeon: Maeola Harman, MD;  Location: [facility] OR;  Service: Vascular;   Laterality: Right;   AV FISTULA PLACEMENT Right 01/16/2023   Procedure: INSERTION OF RIGHT ARM ARTERIOVENOUS (AV) GORE-TEX GRAFT;  Surgeon: Leonie Douglas, MD;  Location: [facility] OR;  Service: Vascular;  Laterality: Right;   BASCILIC VEIN TRANSPOSITION Right 09/10/2018   Procedure: CEPHALIC VEIN TRANSPOSITION;  Surgeon: Maeola Harman, MD;  Location: [facility] OR;  Service: Vascular;  Laterality: Right;   BASCILIC VEIN TRANSPOSITION Left 01/25/2021   Procedure: LEFT ARMFIRST STAGE BASCILIC VEIN TRANSPOSITION;  Surgeon: Leonie Douglas, MD;  Location: [facility] OR;  Service: Vascular;  Laterality: Left;  PERIPHERAL NERVE BLOCK   BASCILIC VEIN TRANSPOSITION Left 03/15/2021   Procedure: LEFT UPPER ARM SECOND STAGE BASILIC TRANSPOSITION;  Surgeon: Leonie Douglas, MD;  Location: [facility] OR;  Service: Vascular;  Laterality: Left;   COLONOSCOPY     INSERTION OF DIALYSIS CATHETER N/A 06/09/2018   Procedure: INSERTION OF DIALYSIS CATHETER;  Surgeon: Maeola Harman, MD;  Location: [facility] OR;  Service: Vascular;  Laterality: N/A;   JOINT REPLACEMENT Left 2010   hip   PARTIAL NEPHRECTOMY Left 2010   PERIPHERAL VASCULAR INTERVENTION Left 02/10/2018   Procedure: PERIPHERAL VASCULAR INTERVENTION;  Surgeon: Nada Libman, MD;  Location: [facility];  Service: Cardiovascular;  Laterality: Left;   TONSILLECTOMY       Social History:   reports that he quit smoking about 36 years ago. His smoking use included cigarettes. His smokeless tobacco use includes chew. He reports that he does not drink alcohol and does not use drugs.   Family History:  His family history includes Dementia in his mother; Hypertension in his father.   Allergies Allergies  Allergen Reactions   Doxazosin Mesylate     ineffective for BP (10/2014)   Triamterene-Hctz     increased creatinine (see 09/2014 office note)     Home Medications  Prior to Admission medications   Medication Sig Start Date End Date Taking? Authorizing  Provider  acetaminophen (TYLENOL) 500 MG tablet Take 1,000 mg by mouth as needed for mild pain or moderate pain.   Yes [provider]  apixaban (ELIQUIS) 5 MG TABS tablet Take 1 tablet (5 mg total) by mouth 2 (two) times daily. 02/26/23  Yes Rollene Rotunda, MD  atorvastatin (LIPITOR) 10 MG  tablet Take 10 mg by mouth at bedtime.   Yes [provider]  AURYXIA 1 GM 210 MG(Fe) tablet Take 420-630 mg by mouth See admin instructions. Take 630mg  by mouth three times daily with meals and take 420mg  by mouth with snacks.   Yes [provider]  VITAMIN D, CHOLECALCIFEROL, PO Take 1 tablet by mouth once a week. Patient had Dialysis on Monday, Wednesday, and Friday. 03/24/23 03/22/24 Yes [provider]     Critical care time:   CRITICAL CARE Performed by: Joshawn Crissman D. Harris  Total critical care time: 40 minutes  Critical care time was exclusive of separately billable procedures and treating other patients.  Critical care was necessary to treat or prevent imminent or life-threatening deterioration.  Critical care was time spent personally by me on the following activities: development of treatment plan with patient and/or surrogate as well as nursing, discussions with consultants, evaluation of patient's response to treatment, examination of patient, obtaining history from patient or surrogate, ordering and performing treatments and interventions, ordering and review of laboratory studies, ordering and review of radiographic studies, pulse oximetry and re-evaluation of patient's condition.  Lawanna Cecere D. Harris, NP-C [facility] Personal contact information can be found on Amion  If no contact or response made please call [phone] 04/06/2023, 11:42 AM

## 2023-04-06 NOTE — Progress Notes (Addendum)
PROGRESS NOTE                                                                                                                                                                                                             Patient Demographics:    Peter [name], is a 72 y.o. male, DOB - 01-Jun-1951, NGE:[id number]  Outpatient Primary MD for the patient is [name], MD    LOS - 5  Admit date - 04/01/2023    Chief Complaint  Patient presents with   Fever   Emesis       Brief Narrative (HPI from H&P)   72 y.o. male with medical history significant of ESRD on HD MWF, Type 2 DM  diet controlled,  anemia of CKD, hypertension, paroxysmal atrial fibrillation on Eliquis, hyperlipidemia, was brought in for generalized weakness, apparently patient was doing fine up till 2 days ago, sometime on Sunday he started developing nausea vomiting generalized weakness, had a fall, low-grade fever, subsequently brought to the ER where he was found to be septic and admitted to the hospital.   Subjective:   Patient in bed, appears comfortable, denies any headache, no fever, no chest pain or pressure, no shortness of breath , no abdominal pain. No new focal weakness.   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 including bilateral shoulder, possibly right knee and right wrist  ID to see the patient shortly, source of infection not clear, for now cover with Nafcillin, stable CT scan abdomen pelvis nausea vomiting per [facility] likely due to his infection and sepsis, ID following, underwent MRI C-spine, left shoulder with nonspecific findings, echocardiogram with nonspecific changes and mitral valve, blood cultures growing MSSA, arthrocentesis of right knee prelim cultures also likely growing Staph aureus, also  bilateral shoulder arthrocentesis done on 04/05/2023 suggestive of septic arthritis, he likely has involvement of his right wrist as well.    I still strongly suspect that he has endocarditis with MSSA bacteremia and disseminated septic arthritis, cardiology, ID, orthopedics, GI along with PCCM following the patient, overall remains quite tenuous, orthopedics attempting for bilateral shoulder washout on 04/06/2023, patient agreeable for blood transfusions if needed, antibiotics per ID, he may still  end up in ICU.  TEE scheduled in the next few days per cardiology, continue to monitor.   Aspiration pneumonia.  Antibiotics per ID currently on nafcillin only.  Changes being made by ID based on their assessment.  ESRD.  On MWF schedule.  Nephrology on board, Right arm AV graft.  Fall without syncope.  Has soft tissue injurie, multiple joint pains including C-spine discomfort, bilateral shoulder pains, right knee and right wrist pain.  Orthopedics on board.  Underwent right knee arthrocentesis with prelim cultures growing MSSA, right shoulder aspiration also being considered by orthopedics.  Right knee pain, bilateral shoulder discomfort, right wrist and neck pain.  X - Ray nonacute, orthopedic following, kindly see above.  GERD.  IV PPI.  Paroxysmal atrial fibrillation [place] vas 2 score of 3.  Switch Eliquis to heparin till clinical situation stabilizes, placed on low-dose Cardizem p.o. and as needed IV.  Hypotension.  Added midodrine and gentle fluids.  Monitor.    Toxic encephalopathy due to sepsis.  Stable but overall still tenuous, minimal improvement.  Mild asymptomatic transaminitis.  Negative of acute hepatitis panel, likely shock liver, trend stable, GI on board,.  CT abdomen pelvis nonacute.  Bilirubin stable.       Condition - Extremely Guarded  Family Communication  : Family friend, [name] [phone] at bedside on 04/02/2023, of note patient has confirmed to me multiple times that he  wants [name] to be the decision maker for him.  Code Status : Full code  Consults  : Nephrology, GI, ID, Ortho, PCCM, cardiology, palliative care  PUD Prophylaxis :  PPI   Procedures  :     TTE  MRI left shoulder and C-spine.    Echocardiogram.   1. Left ventricular ejection fraction, by estimation, is 60 to 65%. The left ventricle has normal function. The left ventricle has no regional wall motion abnormalities. There is severe left ventricular hypertrophy. Left ventricular diastolic parameters  are indeterminate.  2. Right ventricular systolic function is normal. The right ventricular size is normal.  3. Left atrial size was moderately dilated.  4. Mild appearing MR but some splay artifact cannot r/o some posterior leaflet prolapse in addition to MAC with anteriorly directed MR Given bacteremia and abnormal posterior mitral annulus suggest TEE to further assess if clnically indicated. The mitral valve is degenerative. Mild mitral valve regurgitation. No evidence of mitral stenosis. Moderate mitral annular calcification.  5. The aortic valve is tricuspid. There is moderate calcification of the aortic valve. There is moderate thickening of the aortic valve. Aortic valve regurgitation is not visualized. Mild aortic valve stenosis.  6. The inferior vena cava is dilated in size with >50% respiratory variability, suggesting right atrial pressure of 8 mmHg.  CT scan head and C-spine.  1.  No evidence of an acute intracranial abnormality. 2. Mild generalized parenchymal atrophy. 3. Paranasal sinus disease as described. CT cervical spine: 1. No evidence of an acute cervical spine fracture. 2. Cervical spondylosis as outlined. Notably at C2-C3 and C3-C4, central disc protrusions contribute to at least moderate spinal canal stenosis. Multilevel bony neural from narrowing.   Lower extremity bilateral venous duplex.  No DVT.    Right knee arthrocentesis.  5000 WBCs predominantly neutrophils, cultures  pending.    Bilateral shoulder arthrocentesis on 04/05/2023.  Suggestive of septic arthritis  CT abdomen pelvis - 1. No acute finding by CT. 2. Atrophic kidneys, more so on the left than the right. 3. Diverticulosis without evidence of diverticulitis. 4. Cardiomegaly  and coronary artery calcification. 5. Mild dependent atelectasis at the lung bases.      Disposition Plan  :    Status is: Inpatient   DVT Prophylaxis  :  Hep gtt >> Eliquis  Place and maintain sequential compression device Start: 04/02/23 1116    Lab Results  Component Value Date   PLT 185 04/06/2023    Diet :  Diet Order             Diet NPO time specified Except for: Sips with Meds  Diet effective ____                    Inpatient Medications  Scheduled Meds:  (feeding supplement) PROSource Plus  30 mL Oral BID BM   atorvastatin  10 mg Oral QHS   bupivacaine(PF)  10 mL Infiltration Once   chlorhexidine  60 mL Topical Once   Chlorhexidine Gluconate Cloth  6 each Topical Q0600   cinacalcet  180 mg Oral Q breakfast   ferric citrate  630 mg Oral TID WC   metoprolol tartrate  12.5 mg Oral BID   midodrine  10 mg Oral TID WC   multivitamin  1 tablet Oral QHS   pantoprazole (PROTONIX) IV  40 mg Intravenous Q12H   povidone-iodine  2 Application Topical Once   Continuous Infusions:  heparin Stopped (04/06/23 0552)   lactated ringers 75 mL/hr at 04/06/23 0553   nafcillin IV 2 g (04/06/23 0352)   PRN Meds:.acetaminophen, diltiazem, lidocaine (PF), mouth rinse, oxyCODONE    Objective:   Vitals:   04/05/23 1951 04/06/23 0018 04/06/23 0340 04/06/23 0645  BP: (!) 95/58 (!) 90/57 (!) 72/53 (!) 115/46  Pulse: 70 90 94 86  Resp: 20 (!) 21 17 (!) 22  Temp: 98.9 F (37.2 C) 98.8 F (37.1 C) 98.2 F (36.8 C)   TempSrc:  Oral Oral   SpO2: 95% 93% 92% 95%  Weight:      Height:        Wt Readings from Last 3 Encounters:  04/04/23 97.4 kg  03/20/23 99.4 kg  02/26/23 96.8 kg    No intake or output  data in the 24 hours ending 04/06/23 0659    Physical Exam  Awake Alert , No new F.N deficits, Normal affect [place].AT,PERRAL Supple Neck, No JVD,   Symmetrical Chest wall movement, Good air movement bilaterally, CTAB RRR,No Gallops,Rubs or new Murmurs,  +ve B.Sounds, Abd Soft, No tenderness,   Diffuse tenderness in right knee, left shoulder, right wrist, now right shoulder as well long with C-spine tenderness        Data Review:    Recent Labs  Lab 04/02/23 0340 04/03/23 0140 04/04/23 0441 04/05/23 0315 04/06/23 0511  WBC 9.0 8.8 8.8 10.3 15.1*  HGB 12.9* 12.1* 11.7* 12.4* 12.6*  HCT 37.0* 35.2* 33.5* 35.4* 35.0*  PLT 77* 87* 98* 130* 185  MCV 97.4 97.2 96.3 95.4 94.1  MCH 33.9 33.4 33.6 33.4 33.9  MCHC 34.9 34.4 34.9 35.0 36.0  RDW 14.4 14.3 14.2 14.4 14.6  LYMPHSABS 0.5* 0.7 1.0 1.8 1.2  MONOABS 0.8 0.8 0.7 0.7 0.7  EOSABS 0.1 0.0 0.1 0.2 0.2  BASOSABS 0.1 0.0 0.0 0.0 0.1    Recent Labs  Lab 04/01/23 0851 04/01/23 1447 04/01/23 1448 04/01/23 2315 04/02/23 0340 04/02/23 0555 04/02/23 0556 04/03/23 0140 04/03/23 1058 04/03/23 2115 04/04/23 0441 04/05/23 0315 04/06/23 0511  NA 129* 127*  --   --   --  131*  --   --   --  130* 127*  --   --   K 5.3* 5.6*  --   --   --  5.2*  --   --   --  4.4 4.1  --   --   CL 91* 93*  --   --   --  90*  --   --   --  87* 89*  --   --   CO2 18* 14*  --   --   --  21*  --   --   --  25 24  --   --   ANIONGAP 20* 20*  --   --   --  20*  --   --   --  18* 14  --   --   GLUCOSE 136* 125*  --   --   --  117*  --   --   --  143* 108*  --   --   BUN 102* 104*  --   --   --  58*  --   --   --  68* 77*  --   --   CREATININE 13.88* 13.58*  --   --   --  7.96*  --   --   --  7.58* 8.18*  --   --   AST 147* 172*  --   --   --   --   --   --  158*  --  230*  --   --   ALT 56* 65*  --   --   --   --   --   --  76*  --  92*  --   --   ALKPHOS 47 39  --   --   --   --   --   --  45  --  48  --   --   BILITOT 1.1 1.0  --   --   --   --   --    --  1.3*  --  3.4*  --   --   ALBUMIN 2.8* 2.5*  --   --   --  2.3*  --   --  1.9*  --  1.7*  --   --   CRP  --   --   --   --   --   --  43.0* 43.8*  --   --  40.7* 26.4* 25.1*  PROCALCITON  --   --   --   --   --   --  37.09 38.83  --   --  32.52 23.82 18.04  LATICACIDVEN 4.6*  --  4.0* 3.9*  --   --   --   --   --   --   --   --   --   INR 1.6*  --   --   --   --   --   --   --   --   --   --   --  1.7*  TSH  --   --   --   --   --   --   --   --   --   --   --   --  1.040  AMMONIA  --   --   --   --  28  --   --   --  20  --   --   --   --  BNP  --   --   --   --   --   --   --  136.6*  --   --  112.2* 142.3* 183.4*  MG  --   --   --   --   --   --   --   --   --  2.2 2.3  --   --   CALCIUM 9.8 9.1  --   --   --  9.1  --   --   --  8.7* 8.2*  --   --       Recent Labs  Lab 04/01/23 0851 04/01/23 1447 04/01/23 1448 04/01/23 2315 04/02/23 0340 04/02/23 0555 04/02/23 0556 04/03/23 0140 04/03/23 1058 04/03/23 2115 04/04/23 0441 04/05/23 0315 04/06/23 0511  CRP  --   --   --   --   --   --  43.0* 43.8*  --   --  40.7* 26.4* 25.1*  PROCALCITON  --   --   --   --   --   --  37.09 38.83  --   --  32.52 23.82 18.04  LATICACIDVEN 4.6*  --  4.0* 3.9*  --   --   --   --   --   --   --   --   --   INR 1.6*  --   --   --   --   --   --   --   --   --   --   --  1.7*  TSH  --   --   --   --   --   --   --   --   --   --   --   --  1.040  AMMONIA  --   --   --   --  28  --   --   --  20  --   --   --   --   BNP  --   --   --   --   --   --   --  136.6*  --   --  112.2* 142.3* 183.4*  MG  --   --   --   --   --   --   --   --   --  2.2 2.3  --   --   CALCIUM 9.8 9.1  --   --   --  9.1  --   --   --  8.7* 8.2*  --   --     Radiology Reports DG FL ASP/INJ MAJOR (SHOULDER, HIP, KNEE)  Result Date: 04/05/2023 INDICATION: Right shoulder pain.  Concern for septic joint. EXAM: ASPIRATION OF LARGE JOINT COMPARISON:  MR shoulder right 04/04/2023 CONTRAST:  None FLUOROSCOPY: 1.30 mGy Kerma  COMPLICATIONS: None immediate PROCEDURE: Informed written consent was obtained from the patient after discussion of the risks, benefits and alternatives to treatment. The patient was placed supine on the fluoroscopy table. The right was localized with fluoroscopy. The skin overlying the anterior aspect of the right shoulder was prepped and draped in usual sterile fashion. Approximately 3 mL of 1% lidocaine was used to anesthetize the skin. A 22 gauge spinal needle was advanced into the right shoulder joint at the medial superior aspect of the humeral head and an image was saved for documentation purposes. Approximally 3-4 mL of yellow-green fluid was aspirated. The syringe was capped and sent  to the laboratory for analysis as ordered by the clinical team. The needle was removed and a dressing was placed. The patient tolerated procedure well without immediate postprocedural complication. IMPRESSION: Successful fluoroscopic guided aspiration of the right shoulder. Procedure performed by: Mina Marble, PA-C Electronically Signed   By: Genevive Bi M.D.   On: 04/05/2023 14:45   DG FL ASP/INJ MAJOR (SHOULDER, HIP, KNEE)  Result Date: 04/05/2023 INDICATION: Left shoulder pain.  Concern for septic joint. EXAM: ARTHROCENTESIS/INJECTION OF LARGE JOINT COMPARISON:  MR shoulder left 04/03/2023 CONTRAST:  None FLUOROSCOPY: 1.70 mGy Kerma COMPLICATIONS: None immediate PROCEDURE: Informed written consent was obtained from the patient after discussion of the risks, benefits and alternatives to treatment. The patient was placed supine on the fluoroscopy table. The left was localized with fluoroscopy. The skin overlying the anterior aspect of the left shoulder was prepped and draped in usual sterile fashion. Approximately 3 mL of 1% lidocaine was used to anesthetize the skin. A 22 gauge spinal needle was advanced into the left shoulder joint at the medial superior aspect of the humeral head and an image was saved for  documentation purposes. Approximally 1 mL of dark yellow fluid was aspirated. The syringe was capped and sent to the laboratory for analysis as ordered by the clinical team. The needle was removed and a dressing was placed. The patient tolerated procedure well without immediate postprocedural complication. IMPRESSION: Successful fluoroscopic guided aspiration of the left shoulder. Procedure performed by: Mina Marble, PA-C Electronically Signed   By: Genevive Bi M.D.   On: 04/05/2023 14:34   MR SHOULDER RIGHT WO CONTRAST  Result Date: 04/05/2023 CLINICAL DATA:  Septic arthritis suspected, shoulder, xray done EXAM: MRI OF THE RIGHT SHOULDER WITHOUT CONTRAST TECHNIQUE: Multiplanar, multisequence MR imaging of the shoulder was performed. No intravenous contrast was administered. COMPARISON:  None Available. FINDINGS: Rotator cuff: Complete full-thickness tears of the supraspinatus, infraspinatus, and subscapularis tendons. The supraspinatus and subscapularis tendons are retracted to the level of the glenohumeral joint. Intact teres minor. Muscles: Advanced rotator cuff muscle atrophy and fatty infiltration. Biceps long head:  Not visualized, torn and retracted. Acromioclavicular Joint: Moderate degenerative changes of the AC joint. Moderate volume subacromial-subdeltoid bursal fluid communicating with the glenohumeral joint. Glenohumeral Joint: Large glenohumeral joint effusion. Diffuse chondral thinning with multifocal areas of full-thickness fissuring along both sides of the joint. Labrum:  Diffusely degenerated. Bones: No acute fracture. No dislocation. No bone marrow edema. No erosion is seen. No suspicious marrow replacing bone lesion. Other: None. IMPRESSION: 1. Large glenohumeral joint effusion, nonspecific. Arthrocentesis with joint fluid analysis is recommended to exclude the possibility of septic arthritis. 2. Complete full-thickness tears of the supraspinatus, infraspinatus, and subscapularis  tendons with advanced rotator cuff muscle atrophy and fatty infiltration. 3. Torn and retracted long head of the biceps tendon. 4. Moderate glenohumeral and AC joint osteoarthritis. Electronically Signed   By: Duanne Guess D.O.   On: 04/05/2023 09:11   DG Wrist Complete Right  Result Date: 04/04/2023 CLINICAL DATA:  Recent fall with right wrist pain, initial encounter EXAM: RIGHT WRIST - COMPLETE 3+ VIEW COMPARISON:  CT of the wrist obtained on the same day. 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 No acute fracture or dislocation is noted. Vascular calcifications are noted as well as mild soft tissue swelling. No acute abnormality seen. IMPRESSION: No acute abnormality noted. Electronically Signed   By: Alcide Clever M.D.   On: 04/04/2023 19:11   CT WRIST RIGHT W CONTRAST  Result Date: 04/04/2023 CLINICAL DATA:  Evaluate for septic joint/effusion. Unable to tolerate MRI. EXAM: CT OF THE UPPER RIGHT EXTREMITY WITH CONTRAST TECHNIQUE: Multidetector CT imaging of the upper right extremity was performed according to the standard protocol following intravenous contrast administration. RADIATION DOSE REDUCTION: This exam was performed according to the departmental dose-optimization program which includes automated exposure control, adjustment of the mA and/or kV according to patient size and/or use of iterative reconstruction technique. CONTRAST:  75mL OMNIPAQUE IOHEXOL 350 MG/ML SOLN COMPARISON:  None Available. FINDINGS: Patient scanned with arms down positioning which limits detailed osseous and soft tissue assessment due to soft tissue attenuation from habitus. Bones/Joint/Cartilage No fracture. Possible remote triquetral fracture. No erosive change. There may be a small distal radioulnar joint effusion, series 5, image 62. Suspected small radiocarpal joint effusion sagittal series 11, image 66. This is difficult to accurately assess on the current exam. Ligaments  Suboptimally assessed by CT. Muscles and Tendons No obvious intramuscular collection. Limited assessment for tenosynovial fluid. Soft tissues No soft tissue subcutaneous collection. Mild edema is noted about the ulnar aspect of the wrist and distal forearm. Incidental note of edema in the subcutaneous tissues about the right flank, partially included in this field of view. IMPRESSION: 1. Patient scanned with arms down positioning which limits detailed osseous and soft tissue assessment due to soft tissue attenuation from habitus. 2. There may be a small distal radioulnar joint effusion. Suspected small radiocarpal joint effusion. This is difficult to accurately assess on the current exam. No gross CT findings of osteomyelitis. 3. Mild edema about the ulnar aspect of the wrist and distal forearm. No soft tissue collection. Electronically Signed   By: Narda Rutherford M.D.   On: 04/04/2023 17:06   ECHOCARDIOGRAM COMPLETE  Result Date: 04/03/2023    ECHOCARDIOGRAM REPORT   Patient Name:   AUSENCIO DAVID Rebello Date of Exam: 04/03/2023 Medical Rec #:  [id number]         Height:       66.0 in Accession #:    [id number]        Weight:       216.0 lb Date of Birth:  16-Apr-1951         BSA:          2.067 m Patient Age:    72 years          BP:           106/33 mmHg Patient Gender: M                 HR:           110 bpm. Exam Location:  Inpatient Procedure: 2D Echo, Cardiac Doppler and Color Doppler Indications:    Bacteremia R78.81  History:        Patient has prior history of Echocardiogram examinations, most                 recent 05/30/2018. CHF, Arrythmias:Atrial Fibrillation and                 Bradycardia, Signs/Symptoms:Shortness of Breath and Syncope;                 Risk Factors:Hypertension, Sleep Apnea and Diabetes. ESRD,                 Arteriovenous fistula.  Sonographer:    Lucendia Herrlich Referring Phys: [id number] CORNELIUS N VAN DAM IMPRESSIONS  1. Left ventricular ejection fraction, by estimation, is 60 to 65%.  The left ventricle has normal function. The left ventricle has no regional wall motion abnormalities. There is severe left ventricular hypertrophy. Left ventricular diastolic parameters  are indeterminate.  2. Right ventricular systolic function is normal. The right ventricular size is normal.  3. Left atrial size was moderately dilated.  4. Mild appearing MR but some splay artifact cannot r/o some posterior leaflet prolapse in addition to MAC with anteriorly directed MR Given bacteremia and abnormal posterior mitral annulus suggest TEE to further assess if clnically indicated. The mitral valve is degenerative. Mild mitral valve regurgitation. No evidence of mitral stenosis. Moderate mitral annular calcification.  5. The aortic valve is tricuspid. There is moderate calcification of the aortic valve. There is moderate thickening of the aortic valve. Aortic valve regurgitation is not visualized. Mild aortic valve stenosis.  6. The inferior vena cava is dilated in size with >50% respiratory variability, suggesting right atrial pressure of 8 mmHg. FINDINGS  Left Ventricle: Left ventricular ejection fraction, by estimation, is 60 to 65%. The left ventricle has normal function. The left ventricle has no regional wall motion abnormalities. The left ventricular internal cavity size was normal in size. There is  severe left ventricular hypertrophy. Left ventricular diastolic parameters are indeterminate. Right Ventricle: The right ventricular size is normal. No increase in right ventricular wall thickness. Right ventricular systolic function is normal. Left Atrium: Left atrial size was moderately dilated. Right Atrium: Right atrial size was normal in size. Pericardium: There is no evidence of pericardial effusion. Mitral Valve: Mild appearing MR but some splay artifact cannot r/o some posterior leaflet prolapse in addition to MAC with anteriorly directed MR Given bacteremia and abnormal posterior mitral annulus suggest TEE  to further assess if clnically indicated.  The mitral valve is degenerative in appearance. There is moderate thickening of the mitral valve leaflet(s). There is moderate calcification of the mitral valve leaflet(s). Moderate mitral annular calcification. Mild mitral valve regurgitation. No evidence of mitral valve stenosis. Tricuspid Valve: The tricuspid valve is normal in structure. Tricuspid valve regurgitation is mild . No evidence of tricuspid stenosis. Aortic Valve: The aortic valve is tricuspid. There is moderate calcification of the aortic valve. There is moderate thickening of the aortic valve. Aortic valve regurgitation is not visualized. Mild aortic stenosis is present. Aortic valve mean gradient measures 9.0 mmHg. Aortic valve peak gradient measures 16.6 mmHg. Aortic valve area, by VTI measures 1.54 cm. Pulmonic Valve: The pulmonic valve was normal in structure. Pulmonic valve regurgitation is not visualized. No evidence of pulmonic stenosis. Aorta: The aortic root is normal in size and structure. Venous: The inferior vena cava is dilated in size with greater than 50% respiratory variability, suggesting right atrial pressure of 8 mmHg. IAS/Shunts: No atrial level shunt detected by color flow Doppler.  LEFT VENTRICLE PLAX 2D LVIDd:         4.00 cm   Diastology LVIDs:         2.60 cm   LV e' lateral:   13.40 cm/s LV PW:         1.50 cm   LV E/e' lateral: 8.8 LV IVS:        1.80 cm LVOT diam:     2.30 cm LV SV:         51 LV SV Index:   24 LVOT Area:     4.15 cm  RIGHT VENTRICLE             IVC RV S prime:     15.20  cm/s  IVC diam: 2.10 cm TAPSE (M-mode): 1.9 cm LEFT ATRIUM            Index        RIGHT ATRIUM           Index LA diam:      4.50 cm  2.18 cm/m   RA Area:     19.40 cm LA Vol (A2C): 104.0 ml 50.33 ml/m  RA Volume:   46.80 ml  22.65 ml/m LA Vol (A4C): 128.0 ml 61.94 ml/m  AORTIC VALVE AV Area (Vmax):    1.77 cm AV Area (Vmean):   1.67 cm AV Area (VTI):     1.54 cm AV Vmax:            204.00 cm/s AV Vmean:          137.000 cm/s AV VTI:            0.329 m AV Peak Grad:      16.6 mmHg AV Mean Grad:      9.0 mmHg LVOT Vmax:         87.00 cm/s LVOT Vmean:        55.067 cm/s LVOT VTI:          0.122 m LVOT/AV VTI ratio: 0.37  AORTA Ao Root diam: 3.70 cm Ao Asc diam:  3.90 cm MITRAL VALVE                TRICUSPID VALVE MV Area (PHT): 4.15 cm     TR Peak grad:   30.7 mmHg MV Decel Time: 183 msec     TR Vmax:        277.00 cm/s MR Peak grad: 31.4 mmHg MR Vmax:      280.00 cm/s   SHUNTS MV E velocity: 118.00 cm/s  Systemic VTI:  0.12 m MV A velocity: 45.30 cm/s   Systemic Diam: 2.30 cm MV E/A ratio:  2.60 Charlton Haws MD Electronically signed by Charlton Haws MD Signature Date/Time: 04/03/2023/5:07:21 PM    Final    MR SHOULDER LEFT WO CONTRAST  Result Date: 04/03/2023 CLINICAL DATA:  Left shoulder pain. EXAM: MRI OF THE LEFT SHOULDER WITHOUT CONTRAST TECHNIQUE: Multiplanar, multisequence MR imaging of the shoulder was performed. No intravenous contrast was administered. COMPARISON:  None Available. FINDINGS: Rotator cuff: Complete tear of the supraspinatus and infraspinatus tendons with 5.7 cm of retraction. Teres minor tendon is intact. Moderate tendinosis of the subscapularis tendon with a small partial-thickness tear. Muscles: Severe atrophy of the supraspinatus, infraspinatus and subscapularis muscles. Biceps Long Head: Complete tear of the proximal long head of the biceps tendon. Acromioclavicular Joint: Moderate arthropathy of the acromioclavicular joint. Small amount of subacromial/subdeltoid bursal fluid. Glenohumeral Joint: Large joint effusion. Partial-thickness cartilage loss of the glenohumeral joint with areas of high-grade partial-thickness cartilage loss. Labrum: Generalized labral degeneration. Bones: No fracture or dislocation. No aggressive osseous lesion. Other: No fluid collection or hematoma. Muscle edema in the short of the biceps muscle and coracobrachialis muscle concerning for  muscle strain. IMPRESSION: 1. Complete tear of the supraspinatus and infraspinatus tendons with 5.7 cm of retraction. 2. Moderate tendinosis of the subscapularis tendon with a small partial-thickness tear. 3. Complete tear of the proximal long head of the biceps tendon. 4. Moderate osteoarthritis of the glenohumeral joint. Large glenohumeral joint effusion. Electronically Signed   By: Elige Ko M.D.   On: 04/03/2023 10:45   MR CERVICAL SPINE WO CONTRAST  Result Date: 04/03/2023 CLINICAL DATA:  In stage renal  disease, cervical bone lesion. EXAM: MRI CERVICAL SPINE WITHOUT CONTRAST TECHNIQUE: Multiplanar, multisequence MR imaging of the cervical spine was performed. No intravenous contrast was administered. COMPARISON:  CT cervical spine 04/01/2023 FINDINGS: Alignment: Physiologic. Vertebrae: No acute fracture, evidence of discitis, or aggressive bone lesion. Cord: Normal signal and morphology. Posterior Fossa, vertebral arteries, paraspinal tissues: Posterior fossa demonstrates no focal abnormality. Vertebral artery flow voids are maintained. Paraspinal soft tissues are unremarkable. Disc levels: Discs: Degenerative disease with disc height loss at C4-5 and C5-6. Degenerative disease with disc height loss at C7-T1, T1-2 and T2-3. C2-3: Small central disc protrusion. Moderate right and mild left facet arthropathy. Mild bilateral foraminal stenosis. No spinal stenosis. C3-4: Broad-based disc bulge. Broad-based disc bulge with a small central disc protrusion. Moderate bilateral facet arthropathy. No severe bilateral foraminal stenosis. Severe spinal stenosis. C4-5: Broad-based disc bulge with a broad central disc protrusion. Severe spinal stenosis. Severe bilateral foraminal stenosis. C5-6: Mild broad-based disc bulge. Moderate right facet arthropathy. Severe bilateral foraminal stenosis. Mild spinal stenosis. C6-7: Mild broad-based disc bulge. Mild bilateral facet arthropathy. Severe right foraminal stenosis.  Moderate left foraminal stenosis. No spinal stenosis. C7-T1: Minimal broad-based disc bulge. No foraminal or central canal stenosis. T1-2: Broad-based disc bulge. Moderate bilateral foraminal stenosis. No spinal stenosis. Bilateral facet arthropathy. T2-3: Broad-based disc bulge. Bilateral facet arthropathy. Mild bilateral foraminal stenosis. IMPRESSION: 1. Diffuse cervical spine spondylosis as described above. 2. No acute osseous injury of the cervical spine. 3. No aggressive osseous lesion of the cervical spine. 4. At C3-4 there is a broad-based disc bulge with a small central disc protrusion. Moderate bilateral facet arthropathy. No severe bilateral foraminal stenosis. Severe spinal stenosis. 5. At C4-5 there is a broad-based disc bulge with a broad central disc protrusion. Severe spinal stenosis. Severe bilateral foraminal stenosis. 6. At C5-6 there is a mild broad-based disc bulge. Moderate right facet arthropathy. Severe bilateral foraminal stenosis. Mild spinal stenosis. 7. At C6-7 there is a mild broad-based disc bulge. Mild bilateral facet arthropathy. Severe right foraminal stenosis. Moderate left foraminal stenosis. Electronically Signed   By: Elige Ko M.D.   On: 04/03/2023 10:40   VAS [place] LOWER EXTREMITY VENOUS (DVT)  Result Date: 04/02/2023  Lower Venous DVT Study Patient Name:  TAWHID CORSO  Date of Exam:   04/02/2023 Medical Rec #: [id number]          Accession #:    [id number] Date of Birth: 01-21-1951          Patient Gender: M Patient Age:   68 years Exam Location:  [facility] Procedure:      VAS [place] LOWER EXTREMITY VENOUS (DVT) Referring Phys: Kathlen Mody --------------------------------------------------------------------------------  Indications: Swelling.  Limitations: Body habitus and poor ultrasound/tissue interface. Comparison Study: Previous 01/09/18 negative. Performing Technologist: McKayla Maag RVT, [place]  Examination Guidelines: A complete evaluation includes B-mode imaging,  spectral Doppler, color Doppler, and power Doppler as needed of all accessible portions of each vessel. Bilateral testing is considered an integral part of a complete examination. Limited examinations for reoccurring indications may be performed as noted. The reflux portion of the exam is performed with the patient in reverse Trendelenburg.  +-----+---------------+---------+-----------+----------+--------------+ RIGHTCompressibilityPhasicitySpontaneityPropertiesThrombus Aging +-----+---------------+---------+-----------+----------+--------------+ CFV  Full           Yes      Yes                                 +-----+---------------+---------+-----------+----------+--------------+  SFJ  Full                                                        +-----+---------------+---------+-----------+----------+--------------+   +---------+---------------+---------+-----------+----------+-------------------+ LEFT     CompressibilityPhasicitySpontaneityPropertiesThrombus Aging      +---------+---------------+---------+-----------+----------+-------------------+ CFV      Full           Yes      Yes                                      +---------+---------------+---------+-----------+----------+-------------------+ SFJ      Full                                                             +---------+---------------+---------+-----------+----------+-------------------+ FV Prox  Full                                                             +---------+---------------+---------+-----------+----------+-------------------+ FV Mid   Full                                                             +---------+---------------+---------+-----------+----------+-------------------+ FV DistalFull                                                             +---------+---------------+---------+-----------+----------+-------------------+ PFV      Full                                                              +---------+---------------+---------+-----------+----------+-------------------+ POP      Full           Yes      Yes                                      +---------+---------------+---------+-----------+----------+-------------------+ PTV      Full                                                             +---------+---------------+---------+-----------+----------+-------------------+ PERO  Not well visualized +---------+---------------+---------+-----------+----------+-------------------+    Summary: RIGHT: - No evidence of common femoral vein obstruction.  LEFT: - There is no evidence of deep vein thrombosis in the lower extremity. However, portions of this examination were limited- see technologist comments above.  - No cystic structure found in the popliteal fossa.  *See table(s) above for measurements and observations. Electronically signed by Coral Else MD on 04/02/2023 at 9:15:20 PM.    Final    CT ABDOMEN PELVIS W CONTRAST  Result Date: 04/02/2023 CLINICAL DATA:  Abdominal pain, acute, nonlocalized. End stage renal disease. Nausea and vomiting. Sepsis. EXAM: CT ABDOMEN AND PELVIS WITH CONTRAST TECHNIQUE: Multidetector CT imaging of the abdomen and pelvis was performed using the standard protocol following bolus administration of intravenous contrast. RADIATION DOSE REDUCTION: This exam was performed according to the departmental dose-optimization program which includes automated exposure control, adjustment of the mA and/or kV according to patient size and/or use of iterative reconstruction technique. CONTRAST:  75mL OMNIPAQUE IOHEXOL 350 MG/ML SOLN COMPARISON:  04/05/2009 FINDINGS: Lower chest: Mild dependent atelectasis at the lung bases. Tiny amount of pleural fluid. Non dependent lung as well aerated. There is cardiomegaly and coronary artery calcification. Hepatobiliary: No liver parenchymal  abnormality. No calcified gallstones. No ductal dilatation. Pancreas: Normal Spleen: Normal Adrenals/Urinary Tract: Adrenal glands are normal. Atrophic kidneys, more so on the left than the right. No hydroureteronephrosis. No stone or mass. Bladder is not well seen because of artifact from a left hip replacement. No bladder distension. Stomach/Bowel: Stomach and small intestine are normal. Normal appendix. No abnormal colon finding. Diverticulosis without evidence of diverticulitis. Vascular/Lymphatic: Aorta and IVC are normal.  No adenopathy. Reproductive: Normal Other: No free fluid or air. Musculoskeletal: Ordinary mild degenerative change affects the spine. IMPRESSION: 1. No acute finding by CT. 2. Atrophic kidneys, more so on the left than the right. 3. Diverticulosis without evidence of diverticulitis. 4. Cardiomegaly and coronary artery calcification. 5. Mild dependent atelectasis at the lung bases. Tiny amount of pleural fluid. Electronically Signed   By: Paulina Fusi M.D.   On: 04/02/2023 13:01      Signature  -   Susa Raring M.D on 04/06/2023 at 6:59 AM   -  To page go to [email or web address]

## 2023-04-06 NOTE — Progress Notes (Signed)
Dr Merrily Pew notified of assessment for PIV unsuccessful.  States want s to hold off as long as possible due to bacteremia.  Nafcillin compatible with Heparin.

## 2023-04-06 NOTE — Progress Notes (Signed)
Subjective:  No new complaints though he is developed soft blood pressures Antibiotics:  Anti-infectives (From admission, onward)    Start     Dose/Rate Route Frequency Ordered Stop   04/06/23 1200  tobramycin 80mg /79mL NS (2 mg/mL) for wound/joint injection        80 mg Intra-articular  Once 04/06/23 1108     04/03/23 1200  nafcillin 2 g in sodium chloride 0.9 % 100 mL IVPB        2 g 216 mL/hr over 30 Minutes Intravenous Every 4 hours 04/03/23 1112     04/03/23 1145  nafcillin injection 2 g  Status:  Discontinued        2 g Intravenous Every 4 hours 04/03/23 1050 04/03/23 1112   04/02/23 1245  Ampicillin-Sulbactam (UNASYN) 3 g in sodium chloride 0.9 % 100 mL IVPB  Status:  Discontinued        3 g 200 mL/hr over 30 Minutes Intravenous Daily 04/02/23 1152 04/03/23 1050   04/01/23 2200  ceFAZolin (ANCEF) IVPB 1 g/50 mL premix  Status:  Discontinued        1 g 100 mL/hr over 30 Minutes Intravenous Every 24 hours 04/01/23 2039 04/02/23 1146   04/01/23 0830  cefTRIAXone (ROCEPHIN) 2 g in sodium chloride 0.9 % 100 mL IVPB  Status:  Discontinued        2 g 200 mL/hr over 30 Minutes Intravenous Every 24 hours 04/01/23 0823 04/01/23 2039   04/01/23 0830  azithromycin (ZITHROMAX) 500 mg in sodium chloride 0.9 % 250 mL IVPB  Status:  Discontinued        500 mg 250 mL/hr over 60 Minutes Intravenous Every 24 hours 04/01/23 0823 04/01/23 2039       Medications: Scheduled Meds:  (feeding supplement) PROSource Plus  30 mL Oral BID BM   atorvastatin  10 mg Oral QHS   bupivacaine(PF)  10 mL Infiltration Once   chlorhexidine  60 mL Topical Once   Chlorhexidine Gluconate Cloth  6 each Topical Q0600   cinacalcet  180 mg Oral Q breakfast   ferric citrate  630 mg Oral TID WC   metoprolol tartrate  12.5 mg Oral BID   midodrine  10 mg Oral TID WC   multivitamin  1 tablet Oral QHS   pantoprazole (PROTONIX) IV  40 mg Intravenous Q12H   povidone-iodine  2 Application Topical Once    tobramycin 80mg /32mL NS (2 mg/mL) for wound/joint injection  80 mg Intra-articular Once   Continuous Infusions:  heparin 1,350 Units/hr (04/06/23 1300)   lactated ringers Stopped (04/06/23 0820)   nafcillin IV Stopped (04/06/23 1257)   PRN Meds:.acetaminophen, diltiazem, lidocaine (PF), mouth rinse, oxyCODONE    Objective: Weight change:   Intake/Output Summary (Last 24 hours) at 04/06/2023 1416 Last data filed at 04/06/2023 1300 Gross per 24 hour  Intake 2100.03 ml  Output --  Net 2100.03 ml    Blood pressure 107/81, pulse 73, temperature 98.2 F (36.8 C), temperature source Oral, resp. rate (!) 25, height 5\' 6"  (1.676 m), weight 97.4 kg, SpO2 91 %. Temp:  [98.2 F (36.8 C)-98.9 F (37.2 C)] 98.2 F (36.8 C) (07/07 0340) Pulse Rate:  [70-96] 73 (07/07 1400) Resp:  [17-26] 25 (07/07 1400) BP: (72-122)/(41-83) 107/81 (07/07 1400) SpO2:  [91 %-100 %] 91 % (07/07 1400)  Physical Exam: Physical Exam Constitutional:      Appearance: He is obese. He is ill-appearing.  Eyes:  Extraocular Movements: Extraocular movements intact.     Conjunctiva/sclera: Conjunctivae normal.  Cardiovascular:     Rate and Rhythm: Tachycardia present.     Heart sounds: Murmur heard.  Pulmonary:     Effort: Pulmonary effort is normal.     Breath sounds: No wheezing or rales.  Abdominal:     General: There is no distension.  Musculoskeletal:     Right shoulder: Swelling and effusion present. Decreased range of motion.     Left shoulder: Swelling and effusion present. Decreased range of motion.     Right wrist: Swelling and tenderness present. Decreased range of motion.     Right knee: Swelling and effusion present. Decreased range of motion.  Skin:    General: Skin is warm.  Neurological:     General: No focal deficit present.     Mental Status: He is alert and oriented to person, place, and time.  Psychiatric:        Mood and Affect: Mood normal.        Speech: Speech is delayed.         Behavior: Behavior is cooperative.        Thought Content: Thought content normal.        Judgment: Judgment normal.      CBC:    BMET Recent Labs    04/04/23 0441 04/06/23 0815  NA 127* 129*  K 4.1 5.3*  CL 89* 90*  CO2 24 21*  GLUCOSE 108* 64*  BUN 77* 77*  CREATININE 8.18* 7.17*  CALCIUM 8.2* 7.3*      Liver Panel  Recent Labs    04/04/23 0441 04/06/23 0815  PROT 5.9* 6.6  ALBUMIN 1.7* <1.5*  AST 230* 89*  ALT 92* 40  ALKPHOS 48 80  BILITOT 3.4* 9.8*        Sedimentation Rate No results for input(s): "ESRSEDRATE" in the last 72 hours. C-Reactive Protein Recent Labs    04/05/23 0315 04/06/23 0511  CRP 26.4* 25.1*     Micro Results: Recent Results (from the past 720 hour(s))  Resp panel by RT-PCR (RSV, Flu A&B, Covid) Anterior Nasal Swab     Status: None   Collection Time: 04/01/23  8:23 AM   Specimen: Anterior Nasal Swab  Result Value Ref Range Status   SARS Coronavirus 2 by RT PCR NEGATIVE NEGATIVE Final   Influenza A by PCR NEGATIVE NEGATIVE Final   Influenza B by PCR NEGATIVE NEGATIVE Final    Comment: (NOTE) The Xpert Xpress SARS-CoV-2/FLU/RSV plus assay is intended as an aid in the diagnosis of influenza from Nasopharyngeal swab specimens and should not be used as a sole basis for treatment. Nasal washings and aspirates are unacceptable for Xpert Xpress SARS-CoV-2/FLU/RSV testing.  Fact Sheet for Patients: BloggerCourse.com  Fact Sheet for Healthcare Providers: SeriousBroker.it  This test is not yet approved or cleared by the [place] FDA and has been authorized for detection and/or diagnosis of SARS-CoV-2 by FDA under an Emergency Use Authorization (EUA). This EUA will remain in effect (meaning this test can be used) for the duration of the COVID-19 declaration under Section 564(b)(1) of the Act, 21 U.S.C. section 360bbb-3(b)(1), unless the authorization is terminated  or revoked.     Resp Syncytial Virus by PCR NEGATIVE NEGATIVE Final    Comment: (NOTE) Fact Sheet for Patients: BloggerCourse.com  Fact Sheet for Healthcare Providers: SeriousBroker.it  This test is not yet approved or cleared by the [place] FDA and has been authorized for detection  and/or diagnosis of SARS-CoV-2 by FDA under an Emergency Use Authorization (EUA). This EUA will remain in effect (meaning this test can be used) for the duration of the COVID-19 declaration under Section 564(b)(1) of the Act, 21 U.S.C. section 360bbb-3(b)(1), unless the authorization is terminated or revoked.  Performed at [facility], [address], [place], [place] [address]   Blood Culture (routine x 2)     Status: Abnormal   Collection Time: 04/01/23  8:51 AM   Specimen: BLOOD LEFT FOREARM  Result Value Ref Range Status   Specimen Description BLOOD LEFT FOREARM  Final   Special Requests   Final    BOTTLES DRAWN AEROBIC AND ANAEROBIC Blood Culture results may not be optimal due to an excessive volume of blood received in culture bottles   Culture  Setup Time   Final    GRAM POSITIVE COCCI IN CLUSTERS IN BOTH AEROBIC AND ANAEROBIC BOTTLES CRITICAL RESULT CALLED TO, READ BACK BY AND VERIFIED WITH: PHARMD HAILEY VONDOLEN ON 04/01/23 @ 2017 BY DRT Performed at [facility], [address], [place], [place] [address]    Culture STAPHYLOCOCCUS AUREUS (A)  Final   Report Status 04/03/2023 FINAL  Final   Organism ID, Bacteria STAPHYLOCOCCUS AUREUS  Final      Susceptibility   Staphylococcus aureus - MIC*    CIPROFLOXACIN <=0.5 SENSITIVE Sensitive     ERYTHROMYCIN <=0.25 SENSITIVE Sensitive     GENTAMICIN <=0.5 SENSITIVE Sensitive     OXACILLIN <=0.25 SENSITIVE Sensitive     TETRACYCLINE <=1 SENSITIVE Sensitive     VANCOMYCIN 1 SENSITIVE Sensitive     TRIMETH/SULFA <=10 SENSITIVE Sensitive     CLINDAMYCIN <=0.25 SENSITIVE  Sensitive     RIFAMPIN <=0.5 SENSITIVE Sensitive     Inducible Clindamycin NEGATIVE Sensitive     LINEZOLID 2 SENSITIVE Sensitive     * STAPHYLOCOCCUS AUREUS  Blood Culture ID Panel (Reflexed)     Status: Abnormal   Collection Time: 04/01/23  8:51 AM  Result Value Ref Range Status   Enterococcus faecalis NOT DETECTED NOT DETECTED Final   Enterococcus Faecium NOT DETECTED NOT DETECTED Final   Listeria monocytogenes NOT DETECTED NOT DETECTED Final   Staphylococcus species DETECTED (A) NOT DETECTED Final    Comment: CRITICAL RESULT CALLED TO, READ BACK BY AND VERIFIED WITH: PHARMD HAILEY VONDOLEN ON 04/01/23 @ 2017 BY DRT    Staphylococcus aureus (BCID) DETECTED (A) NOT DETECTED Final    Comment: CRITICAL RESULT CALLED TO, READ BACK BY AND VERIFIED WITH: PHARMD HAILEY VONDOLEN ON 04/01/23 @ 2017 BY DRT    Staphylococcus epidermidis NOT DETECTED NOT DETECTED Final   Staphylococcus lugdunensis NOT DETECTED NOT DETECTED Final   Streptococcus species NOT DETECTED NOT DETECTED Final   Streptococcus agalactiae NOT DETECTED NOT DETECTED Final   Streptococcus pneumoniae NOT DETECTED NOT DETECTED Final   Streptococcus pyogenes NOT DETECTED NOT DETECTED Final   A.calcoaceticus-baumannii NOT DETECTED NOT DETECTED Final   Bacteroides fragilis NOT DETECTED NOT DETECTED Final   Enterobacterales NOT DETECTED NOT DETECTED Final   Enterobacter cloacae complex NOT DETECTED NOT DETECTED Final   Escherichia coli NOT DETECTED NOT DETECTED Final   Klebsiella aerogenes NOT DETECTED NOT DETECTED Final   Klebsiella oxytoca NOT DETECTED NOT DETECTED Final   Klebsiella pneumoniae NOT DETECTED NOT DETECTED Final   Proteus species NOT DETECTED NOT DETECTED Final   Salmonella species NOT DETECTED NOT DETECTED Final   Serratia marcescens NOT DETECTED NOT DETECTED Final   Haemophilus  influenzae NOT DETECTED NOT DETECTED Final   Neisseria meningitidis NOT DETECTED NOT DETECTED Final   Pseudomonas aeruginosa NOT  DETECTED NOT DETECTED Final   Stenotrophomonas maltophilia NOT DETECTED NOT DETECTED Final   Candida albicans NOT DETECTED NOT DETECTED Final   Candida auris NOT DETECTED NOT DETECTED Final   Candida glabrata NOT DETECTED NOT DETECTED Final   Candida krusei NOT DETECTED NOT DETECTED Final   Candida parapsilosis NOT DETECTED NOT DETECTED Final   Candida tropicalis NOT DETECTED NOT DETECTED Final   Cryptococcus neoformans/gattii NOT DETECTED NOT DETECTED Final   Meth resistant mecA/C and MREJ NOT DETECTED NOT DETECTED Final    Comment: Performed at [facility], [address], [place], [place] [address]  Blood Culture (routine x 2)     Status: None   Collection Time: 04/01/23  1:09 PM   Specimen: BLOOD LEFT HAND  Result Value Ref Range Status   Specimen Description BLOOD LEFT HAND  Final   Special Requests   Final    BOTTLES DRAWN AEROBIC AND ANAEROBIC Blood Culture adequate volume   Culture   Final    NO GROWTH 5 DAYS Performed at [facility], [address], [place], [place] [address]    Report Status 04/06/2023 FINAL  Final  Culture, blood (Routine X 2) w Reflex to ID Panel     Status: None   Collection Time: 04/01/23 11:15 PM   Specimen: BLOOD  Result Value Ref Range Status   Specimen Description BLOOD BLOOD LEFT ARM  Final   Special Requests   Final    BOTTLES DRAWN AEROBIC AND ANAEROBIC Blood Culture results may not be optimal due to an inadequate volume of blood received in culture bottles   Culture   Final    NO GROWTH 5 DAYS Performed at [facility], [address] [place], [place] [address]    Report Status 04/06/2023 FINAL  Final  Culture, blood (Routine X 2) w Reflex to ID Panel     Status: None   Collection Time: 04/01/23 11:21 PM   Specimen: BLOOD  Result Value Ref Range Status   Specimen Description BLOOD BLOOD LEFT HAND  Final   Special Requests   Final    BOTTLES DRAWN AEROBIC AND ANAEROBIC Blood Culture results may not be optimal due to  an inadequate volume of blood received in culture bottles   Culture   Final    NO GROWTH 5 DAYS Performed at [facility], [address], [place], [place] [address]    Report Status 04/06/2023 FINAL  Final  MRSA Next Gen by PCR, Nasal     Status: None   Collection Time: 04/02/23  5:42 AM   Specimen: Nasal Mucosa; Nasal Swab  Result Value Ref Range Status   MRSA by PCR Next Gen NOT DETECTED NOT DETECTED Final    Comment: (NOTE) The GeneXpert MRSA Assay (FDA approved for NASAL specimens only), is one component of a comprehensive MRSA colonization surveillance program. It is not intended to diagnose MRSA infection nor to guide or monitor treatment for MRSA infections. Test performance is not FDA approved in patients less than 59 years old. Performed at [facility], [address], [place], [place] [address]   Body fluid culture w Gram Stain     Status: None   Collection Time: 04/02/23  9:13 PM   Specimen: Body Fluid  Result Value Ref Range Status   Specimen Description FLUID SYNOVIAL RIGHT  KNEE  Final   Special Requests NONE  Final   Gram Stain   Final    ABUNDANT WBC PRESENT, PREDOMINANTLY PMN RARE GRAM POSITIVE COCCI INTRACELLULAR Gram Stain Report Called to,Read Back By and Verified With: RN Loney Loh [phone] @ 2258 FH Performed at [facility], [address], [place], [place] [address]    Culture RARE STAPHYLOCOCCUS AUREUS  Final   Report Status 04/05/2023 FINAL  Final   Organism ID, Bacteria STAPHYLOCOCCUS AUREUS  Final      Susceptibility   Staphylococcus aureus - MIC*    CIPROFLOXACIN <=0.5 SENSITIVE Sensitive     ERYTHROMYCIN <=0.25 SENSITIVE Sensitive     GENTAMICIN <=0.5 SENSITIVE Sensitive     OXACILLIN <=0.25 SENSITIVE Sensitive     TETRACYCLINE <=1 SENSITIVE Sensitive     VANCOMYCIN 1 SENSITIVE Sensitive     TRIMETH/SULFA <=10 SENSITIVE Sensitive     CLINDAMYCIN <=0.25 SENSITIVE Sensitive     RIFAMPIN <=0.5 SENSITIVE Sensitive     Inducible  Clindamycin NEGATIVE Sensitive     LINEZOLID 2 SENSITIVE Sensitive     * RARE STAPHYLOCOCCUS AUREUS  Culture, blood (Routine X 2) w Reflex to ID Panel     Status: None (Preliminary result)   Collection Time: 04/03/23  2:43 PM   Specimen: BLOOD LEFT HAND  Result Value Ref Range Status   Specimen Description BLOOD LEFT HAND  Final   Special Requests   Final    BOTTLES DRAWN AEROBIC AND ANAEROBIC Blood Culture adequate volume   Culture   Final    NO GROWTH 3 DAYS Performed at [facility], [address], [place], [place] [address]    Report Status PENDING  Incomplete  Culture, blood (Routine X 2) w Reflex to ID Panel     Status: None (Preliminary result)   Collection Time: 04/03/23  2:47 PM   Specimen: BLOOD LEFT HAND  Result Value Ref Range Status   Specimen Description BLOOD LEFT HAND  Final   Special Requests   Final    BOTTLES DRAWN AEROBIC AND ANAEROBIC Blood Culture adequate volume   Culture   Final    NO GROWTH 3 DAYS Performed at [facility], [address], [place], [place] [address]    Report Status PENDING  Incomplete  Aerobic/Anaerobic Culture w Gram Stain (surgical/deep wound)     Status: None (Preliminary result)   Collection Time: 04/05/23 10:57 AM   Specimen: Body Fluid  Result Value Ref Range Status   Specimen Description FLUID  Final   Special Requests JOINT, LEFT SHOULDER  Final   Gram Stain   Final    MODERATE WBC PRESENT,BOTH PMN AND MONONUCLEAR NO ORGANISMS SEEN    Culture   Final    NO GROWTH < 24 HOURS Performed at [facility], [address], [place], [place] [address]    Report Status PENDING  Incomplete  Aerobic/Anaerobic Culture w Gram Stain (surgical/deep wound)     Status: None (Preliminary result)   Collection Time: 04/05/23 11:01 AM   Specimen: Body Fluid  Result Value Ref Range Status   Specimen Description FLUID  Final   Special Requests JOINT, RIGHT SHOULDER  Final   Gram Stain   Final    MODERATE WBC PRESENT,  PREDOMINANTLY PMN FEW GRAM POSITIVE COCCI    Culture   Final    CULTURE REINCUBATED FOR BETTER GROWTH Performed at [facility], [address] [place], [place] [address]  Report Status PENDING  Incomplete    Studies/Results: DG FL ASP/INJ MAJOR (SHOULDER, HIP, KNEE)  Result Date: 04/05/2023 INDICATION: Right shoulder pain.  Concern for septic joint. EXAM: ASPIRATION OF LARGE JOINT COMPARISON:  MR shoulder right 04/04/2023 CONTRAST:  None FLUOROSCOPY: 1.30 mGy Kerma COMPLICATIONS: None immediate PROCEDURE: Informed written consent was obtained from the patient after discussion of the risks, benefits and alternatives to treatment. The patient was placed supine on the fluoroscopy table. The right was localized with fluoroscopy. The skin overlying the anterior aspect of the right shoulder was prepped and draped in usual sterile fashion. Approximately 3 mL of 1% lidocaine was used to anesthetize the skin. A 22 gauge spinal needle was advanced into the right shoulder joint at the medial superior aspect of the humeral head and an image was saved for documentation purposes. Approximally 3-4 mL of yellow-green fluid was aspirated. The syringe was capped and sent to the laboratory for analysis as ordered by the clinical team. The needle was removed and a dressing was placed. The patient tolerated procedure well without immediate postprocedural complication. IMPRESSION: Successful fluoroscopic guided aspiration of the right shoulder. Procedure performed by: Mina Marble, PA-C Electronically Signed   By: Genevive Bi M.D.   On: 04/05/2023 14:45   DG FL ASP/INJ MAJOR (SHOULDER, HIP, KNEE)  Result Date: 04/05/2023 INDICATION: Left shoulder pain.  Concern for septic joint. EXAM: ARTHROCENTESIS/INJECTION OF LARGE JOINT COMPARISON:  MR shoulder left 04/03/2023 CONTRAST:  None FLUOROSCOPY: 1.70 mGy Kerma COMPLICATIONS: None immediate PROCEDURE: Informed written consent was obtained from the patient after  discussion of the risks, benefits and alternatives to treatment. The patient was placed supine on the fluoroscopy table. The left was localized with fluoroscopy. The skin overlying the anterior aspect of the left shoulder was prepped and draped in usual sterile fashion. Approximately 3 mL of 1% lidocaine was used to anesthetize the skin. A 22 gauge spinal needle was advanced into the left shoulder joint at the medial superior aspect of the humeral head and an image was saved for documentation purposes. Approximally 1 mL of dark yellow fluid was aspirated. The syringe was capped and sent to the laboratory for analysis as ordered by the clinical team. The needle was removed and a dressing was placed. The patient tolerated procedure well without immediate postprocedural complication. IMPRESSION: Successful fluoroscopic guided aspiration of the left shoulder. Procedure performed by: Mina Marble, PA-C Electronically Signed   By: Genevive Bi M.D.   On: 04/05/2023 14:34   MR SHOULDER RIGHT WO CONTRAST  Result Date: 04/05/2023 CLINICAL DATA:  Septic arthritis suspected, shoulder, xray done EXAM: MRI OF THE RIGHT SHOULDER WITHOUT CONTRAST TECHNIQUE: Multiplanar, multisequence MR imaging of the shoulder was performed. No intravenous contrast was administered. COMPARISON:  None Available. FINDINGS: Rotator cuff: Complete full-thickness tears of the supraspinatus, infraspinatus, and subscapularis tendons. The supraspinatus and subscapularis tendons are retracted to the level of the glenohumeral joint. Intact teres minor. Muscles: Advanced rotator cuff muscle atrophy and fatty infiltration. Biceps long head:  Not visualized, torn and retracted. Acromioclavicular Joint: Moderate degenerative changes of the AC joint. Moderate volume subacromial-subdeltoid bursal fluid communicating with the glenohumeral joint. Glenohumeral Joint: Large glenohumeral joint effusion. Diffuse chondral thinning with multifocal areas of  full-thickness fissuring along both sides of the joint. Labrum:  Diffusely degenerated. Bones: No acute fracture. No dislocation. No bone marrow edema. No erosion is seen. No suspicious marrow replacing bone lesion. Other: None. IMPRESSION: 1. Large glenohumeral joint effusion, nonspecific. Arthrocentesis with joint fluid analysis is  recommended to exclude the possibility of septic arthritis. 2. Complete full-thickness tears of the supraspinatus, infraspinatus, and subscapularis tendons with advanced rotator cuff muscle atrophy and fatty infiltration. 3. Torn and retracted long head of the biceps tendon. 4. Moderate glenohumeral and AC joint osteoarthritis. Electronically Signed   By: Duanne Guess D.O.   On: 04/05/2023 09:11   DG Wrist Complete Right  Result Date: 04/04/2023 CLINICAL DATA:  Recent fall with right wrist pain, initial encounter EXAM: RIGHT WRIST - COMPLETE 3+ VIEW COMPARISON:  CT of the wrist obtained on the same day. 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 No acute fracture or dislocation is noted. Vascular calcifications are noted as well as mild soft tissue swelling. No acute abnormality seen. IMPRESSION: No acute abnormality noted. Electronically Signed   By: Alcide Clever M.D.   On: 04/04/2023 19:11   CT WRIST RIGHT W CONTRAST  Result Date: 04/04/2023 CLINICAL DATA:  Evaluate for septic joint/effusion. Unable to tolerate MRI. EXAM: CT OF THE UPPER RIGHT EXTREMITY WITH CONTRAST TECHNIQUE: Multidetector CT imaging of the upper right extremity was performed according to the standard protocol following intravenous contrast administration. RADIATION DOSE REDUCTION: This exam was performed according to the departmental dose-optimization program which includes automated exposure control, adjustment of the mA and/or kV according to patient size and/or use of iterative reconstruction technique. CONTRAST:  75mL OMNIPAQUE IOHEXOL 350 MG/ML SOLN COMPARISON:  None  Available. FINDINGS: Patient scanned with arms down positioning which limits detailed osseous and soft tissue assessment due to soft tissue attenuation from habitus. Bones/Joint/Cartilage No fracture. Possible remote triquetral fracture. No erosive change. There may be a small distal radioulnar joint effusion, series 5, image 62. Suspected small radiocarpal joint effusion sagittal series 11, image 66. This is difficult to accurately assess on the current exam. Ligaments Suboptimally assessed by CT. Muscles and Tendons No obvious intramuscular collection. Limited assessment for tenosynovial fluid. Soft tissues No soft tissue subcutaneous collection. Mild edema is noted about the ulnar aspect of the wrist and distal forearm. Incidental note of edema in the subcutaneous tissues about the right flank, partially included in this field of view. IMPRESSION: 1. Patient scanned with arms down positioning which limits detailed osseous and soft tissue assessment due to soft tissue attenuation from habitus. 2. There may be a small distal radioulnar joint effusion. Suspected small radiocarpal joint effusion. This is difficult to accurately assess on the current exam. No gross CT findings of osteomyelitis. 3. Mild edema about the ulnar aspect of the wrist and distal forearm. No soft tissue collection. Electronically Signed   By: Narda Rutherford M.D.   On: 04/04/2023 17:06      Assessment/Plan:  INTERVAL HISTORY:   IR guided aspirate of both shoulders was performed with high cell counts and right greater than left  Patient seen by Dr. Jena Gauss who aspirated both shoulders and injected with tobramycin.  He also aspirated the right knee and obtained frankly purulent material into which he also injected tobramycin  Principal Problem:   MSSA bacteremia Active Problems:   Obstructive sleep apnea   Essential hypertension, benign   MVP (mitral valve prolapse)   ESRD on dialysis (HCC)   Anemia in chronic kidney disease    PAF (paroxysmal atrial fibrillation) (HCC)   Aspiration pneumonia (HCC)   Staphylococcal arthritis of right knee (HCC)   Staphylococcal arthritis of right wrist (HCC)   Staphylococcal arthritis of left shoulder (HCC)   Staphylococcal arthritis of right shoulder (HCC)  Peter [name] is a 72 y.o. male with  ith end-stage renal disease on hemodialysis with atrial fibrillation on anticoagulation who has been admitted with MSSA bacteremia, and polyarticular septic arthritis with involvement of both shoulders his right knee and likely his right wrist.  Unfortunately his blood pressure has become too unstable for him to undergo surgery.  Dr. Jena Gauss has injected the shoulders and knee with tobramycin as a temporizing effort.  Patient has now been transferred to the intensive care unit  Will have surgery when he is more hemodynamically stable.  We will continue nafcillin  Obtaining TEE is of lower priority at this point in time.  I would also like an MRI of his brain at some point he is more stable  End-stage renal disease on hemodialysis.  Atrial fibrillation also with PVCs being followed by cardiology  I have personally spent 50 minutes involved in face-to-face and non-face-to-face activities for this patient on the day of the visit. Professional time spent includes the following activities: Preparing to see the patient (review of tests), Obtaining and/or reviewing separately obtained history (admission/discharge record), Performing a medically appropriate examination and/or evaluation , Ordering medications/tests/procedures, referring and communicating with other health care professionals, Documenting clinical information in the EMR, Independently interpreting results (not separately reported), Communicating results to the patient/family/caregiver, Counseling and educating the patient/family/caregiver and Care coordination (not separately reported).   Dr. Luciana Axe is back tomorrow.    LOS: 5 days   Acey Lav 04/06/2023, 2:16 PM\

## 2023-04-06 NOTE — Procedures (Signed)
Ortho Note  Patient had fluoroscopic guided aspirations performed yesterday.  On his left shoulder he had 18,000 cells.  On his right shoulder he had over 200,000 cells.  I had placed him on the surgery schedule for I&D of bilateral shoulders and possible right knee.  However his blood pressures were soft overnight consistent with his sepsis.  After discussion with the hospitalist Dr. Thedore Mins and Dr. Renold Don with anesthesia there was concerned about him undergoing general anesthetic with his current status.  The ICU team was consulted and he is being moved to the ICU for better blood pressure support and monitoring with likely an A-line.  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  He is currently septic and will need source control with likely an I&D.  After discussion with all the providers involved as well as his family and the patient I felt it was best to potentially temporize with aspiration and joint injections of his right knee and bilateral shoulders.  I would plan for injection of tobramycin 2 mg/mL.  We would see the response as well as try to improve his blood pressures overnight with plans for potential I&D in the next few days.  Procedure: Timeout was performed and verbal consent was obtained.  For started out with the right hand.  The knee was sterilely prepped and an 18-gauge needle was inserted and approximately 20 mL of murky cloudy fluid was aspirated I then injected approximately 10 mL of tobramycin aqueous solution at a concentration of 2 mg/mL.  I then repeated the process with the subacromial aspiration of the right shoulder.  Unfortunately I was not able to get any fluid off however I just the needle and injected 15 mL of tobramycin at the same concentration.  Finally the left shoulder was sterilely prepped and an 18-gauge needle was used to aspirate approximately 10 mL in the subacromial space and injected 15 mL of tobramycin solution.  The patient  tolerated all the procedures well and I reviewed the findings and the plan with the patient's family afterwards.  Roby Lofts, MD [facility] [phone] (office) [email or web address]

## 2023-04-06 NOTE — Significant Event (Signed)
Rapid Response Event Note   Reason for Call :  Asymptomatic hypotension  Initial Focused Assessment:  Patient A&O x4 in bed, no complaints. Patient lungs clear, heart tones normal. Skin warm and dry, edema present +2. Patient ready for surgery stating "I don't want to go through another night of this without getting things fixed."   Initially BP 60s/40s 86/50 (62) automatic left arm 84/42 (manual) left arm 118/59 left leg HR 87 RR 24  Interventions:  IVF and PO midodrine started earlier this AM MD P. Singh notified, to bedside 250 ml LR bolus  Plan of Care:  Goal SBP >90. Give 1200 midodrine dose early, call back for further needs. May go to OR for joint washout.   Event Summary:  MD Notified: P. Singh  Call Time: 4098 Arrival Time: 1191 End Time: 4782  Truddie Crumble, RN

## 2023-04-06 NOTE — Anesthesia Preprocedure Evaluation (Signed)
Anesthesia Evaluation    Reviewed: Allergy & Precautions, H&P , Patient's Chart, lab work & pertinent test results, Unable to perform ROS - Chart review only  Airway Mallampati: II  TM Distance: >3 FB Neck ROM: Full    Dental no notable dental hx.    Pulmonary shortness of breath, sleep apnea , pneumonia, former smoker   Pulmonary exam normal breath sounds clear to auscultation       Cardiovascular hypertension, Pt. on medications + Peripheral Vascular Disease  Normal cardiovascular exam+ dysrhythmias Atrial Fibrillation + Valvular Problems/Murmurs  Rhythm:Regular Rate:Normal  Echo 04/03/2023  1. Left ventricular ejection fraction, by estimation, is 60 to 65%. The left ventricle has normal function. The left ventricle has no regional wall motion abnormalities. There is severe left ventricular hypertrophy. Left ventricular diastolic parameters   are indeterminate.   2. Right ventricular systolic function is normal. The right ventricular size is normal.   3. Left atrial size was moderately dilated.   4. Mild appearing MR but some splay artifact cannot r/o some posterior leaflet prolapse in addition to MAC with anteriorly directed MR Given bacteremia and abnormal posterior mitral annulus suggest TEE to further assess if clnically indicated. The mitral valve is degenerative. Mild mitral valve regurgitation. No evidence of mitral stenosis. Moderate mitral annular calcification.   5. The aortic valve is tricuspid. There is moderate calcification of the aortic valve. There is moderate thickening of the aortic valve. Aortic valve regurgitation is not visualized. Mild aortic valve stenosis.   6. The inferior vena cava is dilated in size with >50% respiratory variability, suggesting right atrial pressure of 8 mmHg.      Neuro/Psych  Headaches  negative psych ROS   GI/Hepatic negative GI ROS, Neg liver ROS,,,  Endo/Other  negative endocrine  ROSdiabetes, Type 2    Renal/GU ESRF and DialysisRenal diseasenegative Renal ROS  negative genitourinary   Musculoskeletal  (+) Arthritis ,    Abdominal  (+) + obese  Peds negative pediatric ROS (+)  Hematology  (+) Blood dyscrasia, anemia   Anesthesia Other Findings   Reproductive/Obstetrics negative OB ROS                             Anesthesia Physical Anesthesia Plan  ASA: 4  Anesthesia Plan: MAC and Regional   Post-op Pain Management: Regional block* and Minimal or no pain anticipated   Induction: Intravenous  PONV Risk Score and Plan: 1 and Ondansetron and Treatment may vary due to age or medical condition  Airway Management Planned: Simple Face Mask  Additional Equipment:   Intra-op Plan:   Post-operative Plan:   Informed Consent: I have reviewed the patients History and Physical, chart, labs and discussed the procedure including the risks, benefits and alternatives for the proposed anesthesia with the patient or authorized representative who has indicated his/her understanding and acceptance.     Dental advisory given  Plan Discussed with: CRNA  Anesthesia Plan Comments: (PAT note by Antionette Poles, PA-C: 72 yo male with pertinent hx including ESRD on HD, diet-controlled DM2, HTN, OSA (no longer on CPAP after weight loss), mitral regurgitation on prior echo. Recent echo 01/03/23 ordered by PCP Dr. Manus Gunning showed EF 82%, normal wall motion, grade 3 diastolic dysfunction, normal valves, no significant stenosis or regurgitation.   Pt fistula recently thrombosed, now dialyzing via [facility] MWF.  Pt will need DOS labs and eval.   TTE 01/03/2023 (copy on chart): Conclusions: Left ventricle cavity  is normal in size. Normal left ventricular wall thickness. Normal global wall motion. Doppler evidence of grade 3 diastolic dysfunction. Calculated EF 82%. Left atrial cavity is mildly dilated. Structurally normal trileaflet aortic valve with no  regurgitation. Structurally normal mitral valve with no regurgitation. Structurally normal tricuspid valve with no regurgitation. Structurally normal pulmonic valve with no regurgitation.   )        Anesthesia Quick Evaluation

## 2023-04-06 NOTE — Progress Notes (Addendum)
CARDIOLOGY UPDATE NOTE  I have not seen the patient today. We are planning for TEE next week. Plans for washout of septic joints by ortho tomorrow are noted.  We will continue to follow in anticipation of TEE for suspected endocarditis.   Maurice Small, MD

## 2023-04-07 ENCOUNTER — Inpatient Hospital Stay (HOSPITAL_COMMUNITY): Payer: Medicare Other | Admitting: Anesthesiology

## 2023-04-07 ENCOUNTER — Inpatient Hospital Stay (HOSPITAL_COMMUNITY): Payer: Medicare Other

## 2023-04-07 ENCOUNTER — Encounter (HOSPITAL_COMMUNITY): Admission: EM | Disposition: E | Payer: Self-pay | Source: Home / Self Care | Attending: Pulmonary Disease

## 2023-04-07 ENCOUNTER — Encounter (HOSPITAL_COMMUNITY): Payer: Self-pay | Admitting: Internal Medicine

## 2023-04-07 ENCOUNTER — Other Ambulatory Visit: Payer: Self-pay

## 2023-04-07 DIAGNOSIS — M25412 Effusion, left shoulder: Secondary | ICD-10-CM

## 2023-04-07 DIAGNOSIS — I5030 Unspecified diastolic (congestive) heart failure: Secondary | ICD-10-CM | POA: Diagnosis not present

## 2023-04-07 DIAGNOSIS — M25431 Effusion, right wrist: Secondary | ICD-10-CM | POA: Diagnosis not present

## 2023-04-07 DIAGNOSIS — A4101 Sepsis due to Methicillin susceptible Staphylococcus aureus: Secondary | ICD-10-CM

## 2023-04-07 DIAGNOSIS — L089 Local infection of the skin and subcutaneous tissue, unspecified: Secondary | ICD-10-CM

## 2023-04-07 DIAGNOSIS — J690 Pneumonitis due to inhalation of food and vomit: Secondary | ICD-10-CM | POA: Diagnosis not present

## 2023-04-07 DIAGNOSIS — M00061 Staphylococcal arthritis, right knee: Secondary | ICD-10-CM | POA: Diagnosis not present

## 2023-04-07 DIAGNOSIS — B9561 Methicillin susceptible Staphylococcus aureus infection as the cause of diseases classified elsewhere: Secondary | ICD-10-CM | POA: Diagnosis not present

## 2023-04-07 DIAGNOSIS — R7881 Bacteremia: Secondary | ICD-10-CM | POA: Diagnosis not present

## 2023-04-07 DIAGNOSIS — Z992 Dependence on renal dialysis: Secondary | ICD-10-CM

## 2023-04-07 DIAGNOSIS — I132 Hypertensive heart and chronic kidney disease with heart failure and with stage 5 chronic kidney disease, or end stage renal disease: Secondary | ICD-10-CM

## 2023-04-07 DIAGNOSIS — M25411 Effusion, right shoulder: Secondary | ICD-10-CM

## 2023-04-07 DIAGNOSIS — M25462 Effusion, left knee: Secondary | ICD-10-CM

## 2023-04-07 DIAGNOSIS — N186 End stage renal disease: Secondary | ICD-10-CM

## 2023-04-07 DIAGNOSIS — A419 Sepsis, unspecified organism: Secondary | ICD-10-CM | POA: Diagnosis not present

## 2023-04-07 HISTORY — PX: IRRIGATION AND DEBRIDEMENT KNEE: SHX5185

## 2023-04-07 HISTORY — PX: I & D EXTREMITY: SHX5045

## 2023-04-07 LAB — GLUCOSE, CAPILLARY
Glucose-Capillary: 114 mg/dL — ABNORMAL HIGH (ref 70–99)
Glucose-Capillary: 131 mg/dL — ABNORMAL HIGH (ref 70–99)
Glucose-Capillary: 49 mg/dL — ABNORMAL LOW (ref 70–99)
Glucose-Capillary: 76 mg/dL (ref 70–99)
Glucose-Capillary: 82 mg/dL (ref 70–99)
Glucose-Capillary: 87 mg/dL (ref 70–99)
Glucose-Capillary: 92 mg/dL (ref 70–99)
Glucose-Capillary: 99 mg/dL (ref 70–99)

## 2023-04-07 LAB — CBC WITH DIFFERENTIAL/PLATELET
Abs Immature Granulocytes: 2 10*3/uL — ABNORMAL HIGH (ref 0.00–0.07)
Basophils Absolute: 0.1 10*3/uL (ref 0.0–0.1)
Basophils Relative: 1 %
Eosinophils Absolute: 0.3 10*3/uL (ref 0.0–0.5)
Eosinophils Relative: 2 %
HCT: 30.7 % — ABNORMAL LOW (ref 39.0–52.0)
Hemoglobin: 10.8 g/dL — ABNORMAL LOW (ref 13.0–17.0)
Immature Granulocytes: 12 %
Lymphocytes Relative: 6 %
Lymphs Abs: 1 10*3/uL (ref 0.7–4.0)
MCH: 32.6 pg (ref 26.0–34.0)
MCHC: 35.2 g/dL (ref 30.0–36.0)
MCV: 92.7 fL (ref 80.0–100.0)
Monocytes Absolute: 0.5 10*3/uL (ref 0.1–1.0)
Monocytes Relative: 3 %
Neutro Abs: 12.9 10*3/uL — ABNORMAL HIGH (ref 1.7–7.7)
Neutrophils Relative %: 76 %
Platelets: 205 10*3/uL (ref 150–400)
RBC: 3.31 MIL/uL — ABNORMAL LOW (ref 4.22–5.81)
RDW: 15 % (ref 11.5–15.5)
WBC: 16.8 10*3/uL — ABNORMAL HIGH (ref 4.0–10.5)
nRBC: 0.2 % (ref 0.0–0.2)

## 2023-04-07 LAB — POCT I-STAT, CHEM 8
BUN: 85 mg/dL — ABNORMAL HIGH (ref 8–23)
Calcium, Ion: 0.72 mmol/L — CL (ref 1.15–1.40)
Chloride: 93 mmol/L — ABNORMAL LOW (ref 98–111)
Creatinine, Ser: 7.7 mg/dL — ABNORMAL HIGH (ref 0.61–1.24)
Glucose, Bld: 79 mg/dL (ref 70–99)
HCT: 34 % — ABNORMAL LOW (ref 39.0–52.0)
Hemoglobin: 11.6 g/dL — ABNORMAL LOW (ref 13.0–17.0)
Potassium: 5.5 mmol/L — ABNORMAL HIGH (ref 3.5–5.1)
Sodium: 123 mmol/L — ABNORMAL LOW (ref 135–145)
TCO2: 22 mmol/L (ref 22–32)

## 2023-04-07 LAB — COMPREHENSIVE METABOLIC PANEL
ALT: 25 U/L (ref 0–44)
AST: 65 U/L — ABNORMAL HIGH (ref 15–41)
Albumin: <1.5 g/dL — ABNORMAL LOW (ref 3.5–5.0)
Alkaline Phosphatase: 65 U/L (ref 38–126)
Anion gap: 20 — ABNORMAL HIGH (ref 5–15)
BUN: 98 mg/dL — ABNORMAL HIGH (ref 8–23)
CO2: 20 mmol/L — ABNORMAL LOW (ref 22–32)
Calcium: 6.3 mg/dL — CL (ref 8.9–10.3)
Chloride: 90 mmol/L — ABNORMAL LOW (ref 98–111)
Creatinine, Ser: 8.13 mg/dL — ABNORMAL HIGH (ref 0.61–1.24)
GFR, Estimated: 6 mL/min — ABNORMAL LOW (ref >60)
Glucose, Bld: 79 mg/dL (ref 70–99)
Potassium: 5.5 mmol/L — ABNORMAL HIGH (ref 3.5–5.1)
Sodium: 130 mmol/L — ABNORMAL LOW (ref 135–145)
Total Bilirubin: 11.8 mg/dL — ABNORMAL HIGH (ref 0.3–1.2)
Total Protein: 6.4 g/dL — ABNORMAL LOW (ref 6.5–8.1)

## 2023-04-07 LAB — CBC
HCT: 31.2 % — ABNORMAL LOW (ref 39.0–52.0)
HCT: 28.2 % — ABNORMAL LOW (ref 39.0–52.0)
Hemoglobin: 10.6 g/dL — ABNORMAL LOW (ref 13.0–17.0)
Hemoglobin: 9.5 g/dL — ABNORMAL LOW (ref 13.0–17.0)
MCH: 32.4 pg (ref 26.0–34.0)
MCH: 32.4 pg (ref 26.0–34.0)
MCHC: 34 g/dL (ref 30.0–36.0)
MCHC: 33.7 g/dL (ref 30.0–36.0)
MCV: 95.4 fL (ref 80.0–100.0)
MCV: 96.2 fL (ref 80.0–100.0)
Platelets: 204 10*3/uL (ref 150–400)
Platelets: 196 10*3/uL (ref 150–400)
RBC: 3.27 MIL/uL — ABNORMAL LOW (ref 4.22–5.81)
RBC: 2.93 MIL/uL — ABNORMAL LOW (ref 4.22–5.81)
RDW: 15.4 % (ref 11.5–15.5)
RDW: 15.5 % (ref 11.5–15.5)
WBC: 19.5 10*3/uL — ABNORMAL HIGH (ref 4.0–10.5)
WBC: 18.9 10*3/uL — ABNORMAL HIGH (ref 4.0–10.5)
nRBC: 0.2 % (ref 0.0–0.2)
nRBC: 0.2 % (ref 0.0–0.2)

## 2023-04-07 LAB — AEROBIC/ANAEROBIC CULTURE W GRAM STAIN (SURGICAL/DEEP WOUND)

## 2023-04-07 LAB — BASIC METABOLIC PANEL
Anion gap: 16 — ABNORMAL HIGH (ref 5–15)
BUN: 96 mg/dL — ABNORMAL HIGH (ref 8–23)
CO2: 21 mmol/L — ABNORMAL LOW (ref 22–32)
Calcium: 6.2 mg/dL — CL (ref 8.9–10.3)
Chloride: 91 mmol/L — ABNORMAL LOW (ref 98–111)
Creatinine, Ser: 8.02 mg/dL — ABNORMAL HIGH (ref 0.61–1.24)
GFR, Estimated: 6 mL/min — ABNORMAL LOW (ref >60)
Glucose, Bld: 115 mg/dL — ABNORMAL HIGH (ref 70–99)
Potassium: 5.1 mmol/L (ref 3.5–5.1)
Sodium: 128 mmol/L — ABNORMAL LOW (ref 135–145)

## 2023-04-07 LAB — HEPATIC FUNCTION PANEL
ALT: 29 U/L (ref 0–44)
AST: 64 U/L — ABNORMAL HIGH (ref 15–41)
Albumin: <1.5 g/dL — ABNORMAL LOW (ref 3.5–5.0)
Alkaline Phosphatase: 68 U/L (ref 38–126)
Bilirubin, Direct: 3.6 mg/dL — ABNORMAL HIGH (ref 0.0–0.2)
Indirect Bilirubin: 6.7 mg/dL — ABNORMAL HIGH (ref 0.3–0.9)
Total Bilirubin: 10.3 mg/dL — ABNORMAL HIGH (ref 0.3–1.2)
Total Protein: 6.1 g/dL — ABNORMAL LOW (ref 6.5–8.1)

## 2023-04-07 LAB — HEPARIN LEVEL (UNFRACTIONATED): Heparin Unfractionated: 0.58 IU/mL (ref 0.30–0.70)

## 2023-04-07 LAB — PROCALCITONIN: Procalcitonin: 13.17 ng/mL

## 2023-04-07 LAB — PHOSPHORUS: Phosphorus: 11.3 mg/dL — ABNORMAL HIGH (ref 2.5–4.6)

## 2023-04-07 LAB — MAGNESIUM: Magnesium: 2.5 mg/dL — ABNORMAL HIGH (ref 1.7–2.4)

## 2023-04-07 LAB — APTT: aPTT: >200 seconds (ref 24–36)

## 2023-04-07 LAB — TYPE AND SCREEN
ABO/RH(D): O POS
Antibody Screen: NEGATIVE

## 2023-04-07 LAB — C-REACTIVE PROTEIN: CRP: 27.1 mg/dL — ABNORMAL HIGH (ref <1.0)

## 2023-04-07 SURGERY — IRRIGATION AND DEBRIDEMENT EXTREMITY
Anesthesia: General | Site: Shoulder | Laterality: Right

## 2023-04-07 SURGERY — IRRIGATION AND DEBRIDEMENT SHOULDER
Anesthesia: General | Site: Shoulder | Laterality: Right

## 2023-04-07 MED ORDER — ROCURONIUM BROMIDE 10 MG/ML (PF) SYRINGE
PREFILLED_SYRINGE | INTRAVENOUS | Status: AC
  Filled 2023-04-07: qty 10

## 2023-04-07 MED ORDER — SODIUM CHLORIDE (PF) 0.9 % IJ SOLN
INTRAMUSCULAR | Status: AC
  Filled 2023-04-07: qty 20

## 2023-04-07 MED ORDER — PHENYLEPHRINE HCL-NACL 20-0.9 MG/250ML-% IV SOLN
INTRAVENOUS | Status: DC | PRN
  Administered 2023-04-07: 40 ug/min via INTRAVENOUS

## 2023-04-07 MED ORDER — LIDOCAINE 2% (20 MG/ML) 5 ML SYRINGE
INTRAMUSCULAR | Status: AC
  Filled 2023-04-07: qty 5

## 2023-04-07 MED ORDER — SODIUM CHLORIDE 0.9 % IV SOLN: INTRAVENOUS | Status: DC

## 2023-04-07 MED ORDER — ONDANSETRON HCL 4 MG/2ML IJ SOLN
INTRAMUSCULAR | Status: DC | PRN
  Administered 2023-04-07: 4 mg via INTRAVENOUS

## 2023-04-07 MED ORDER — ACETAMINOPHEN 10 MG/ML IV SOLN
INTRAVENOUS | Status: DC | PRN
  Administered 2023-04-07: 1000 mg via INTRAVENOUS

## 2023-04-07 MED ORDER — SODIUM CHLORIDE 0.9 % IR SOLN
Status: DC | PRN
  Administered 2023-04-07 (×2): 3000 mL

## 2023-04-07 MED ORDER — FENTANYL CITRATE (PF) 100 MCG/2ML IJ SOLN: 25.0000 ug | INTRAMUSCULAR | Status: DC | PRN

## 2023-04-07 MED ORDER — ACETAMINOPHEN 10 MG/ML IV SOLN
INTRAVENOUS | Status: AC
  Filled 2023-04-07: qty 100

## 2023-04-07 MED ORDER — CHLORHEXIDINE GLUCONATE 0.12 % MT SOLN
15.0000 mL | Freq: Once | OROMUCOSAL | Status: AC
  Administered 2023-04-07: 15 mL via OROMUCOSAL
  Filled 2023-04-07: qty 15

## 2023-04-07 MED ORDER — VASOPRESSIN 20 UNIT/ML IV SOLN
INTRAVENOUS | Status: AC
  Filled 2023-04-07: qty 1

## 2023-04-07 MED ORDER — NOREPINEPHRINE 4 MG/250ML-% IV SOLN
0.0000 ug/min | INTRAVENOUS | Status: DC
  Administered 2023-04-07: 2 ug/min via INTRAVENOUS
  Administered 2023-04-08: 6 ug/min via INTRAVENOUS
  Administered 2023-04-09: 10 ug/min via INTRAVENOUS
  Administered 2023-04-09: 7 ug/min via INTRAVENOUS
  Administered 2023-04-09: 9 ug/min via INTRAVENOUS
  Administered 2023-04-09: 10 ug/min via INTRAVENOUS
  Administered 2023-04-10: 8 ug/min via INTRAVENOUS
  Administered 2023-04-10: 3 ug/min via INTRAVENOUS
  Administered 2023-04-11: 8 ug/min via INTRAVENOUS
  Administered 2023-04-12: 6 ug/min via INTRAVENOUS
  Filled 2023-04-07: qty 500
  Filled 2023-04-07 (×8): qty 250

## 2023-04-07 MED ORDER — ONDANSETRON HCL 4 MG/2ML IJ SOLN
INTRAMUSCULAR | Status: AC
  Filled 2023-04-07: qty 2

## 2023-04-07 MED ORDER — NOREPINEPHRINE 4 MG/250ML-% IV SOLN
INTRAVENOUS | Status: AC
  Filled 2023-04-07: qty 250

## 2023-04-07 MED ORDER — LACTATED RINGERS IV SOLN: INTRAVENOUS | Status: DC

## 2023-04-07 MED ORDER — DEXTROSE 50 % IV SOLN
25.0000 g | INTRAVENOUS | Status: AC
  Administered 2023-04-07: 25 g via INTRAVENOUS
  Filled 2023-04-07: qty 50

## 2023-04-07 MED ORDER — EPHEDRINE SULFATE-NACL 50-0.9 MG/10ML-% IV SOSY
PREFILLED_SYRINGE | INTRAVENOUS | Status: DC | PRN
  Administered 2023-04-07 (×2): 5 mg via INTRAVENOUS

## 2023-04-07 MED ORDER — CALCIUM GLUCONATE-NACL 2-0.675 GM/100ML-% IV SOLN
2.0000 g | Freq: Once | INTRAVENOUS | Status: AC
  Administered 2023-04-07: 2000 mg via INTRAVENOUS
  Filled 2023-04-07: qty 100

## 2023-04-07 MED ORDER — ALBUMIN HUMAN 5 % IV SOLN: INTRAVENOUS | Status: DC | PRN

## 2023-04-07 MED ORDER — PROPOFOL 10 MG/ML IV BOLUS
INTRAVENOUS | Status: DC | PRN
  Administered 2023-04-07: 80 mg via INTRAVENOUS

## 2023-04-07 MED ORDER — ACETAMINOPHEN 160 MG/5ML PO SOLN: 325.0000 mg | Freq: Once | ORAL | Status: DC | PRN

## 2023-04-07 MED ORDER — ACETAMINOPHEN 325 MG PO TABS: 325.0000 mg | ORAL_TABLET | Freq: Once | ORAL | Status: DC | PRN

## 2023-04-07 MED ORDER — PROPOFOL 10 MG/ML IV BOLUS
INTRAVENOUS | Status: AC
  Filled 2023-04-07: qty 20

## 2023-04-07 MED ORDER — SUGAMMADEX SODIUM 200 MG/2ML IV SOLN
INTRAVENOUS | Status: DC | PRN
  Administered 2023-04-07: 200 mg via INTRAVENOUS

## 2023-04-07 MED ORDER — MIDODRINE HCL 5 MG PO TABS: 10.0000 mg | ORAL_TABLET | Freq: Three times a day (TID) | ORAL | Status: DC

## 2023-04-07 MED ORDER — FENTANYL CITRATE (PF) 250 MCG/5ML IJ SOLN
INTRAMUSCULAR | Status: DC | PRN
  Administered 2023-04-07 (×3): 25 ug via INTRAVENOUS

## 2023-04-07 MED ORDER — PROMETHAZINE HCL 25 MG/ML IJ SOLN: 6.2500 mg | INTRAMUSCULAR | Status: DC | PRN

## 2023-04-07 MED ORDER — ORAL CARE MOUTH RINSE: 15.0000 mL | Freq: Once | OROMUCOSAL | Status: AC

## 2023-04-07 MED ORDER — THROMBIN 5000 UNITS EX SOLR
CUTANEOUS | Status: AC
  Filled 2023-04-07: qty 5000

## 2023-04-07 MED ORDER — OXIDIZED CELLULOSE EX PADS
1.0000 | MEDICATED_PAD | Freq: Once | CUTANEOUS | Status: AC
  Administered 2023-04-07: 1 via TOPICAL
  Filled 2023-04-07: qty 1

## 2023-04-07 MED ORDER — EPHEDRINE 5 MG/ML INJ
INTRAVENOUS | Status: AC
  Filled 2023-04-07: qty 5

## 2023-04-07 MED ORDER — TRANEXAMIC ACID 1000 MG/10ML IV SOLN
2000.0000 mg | Freq: Once | INTRAVENOUS | Status: DC
  Filled 2023-04-07: qty 20

## 2023-04-07 MED ORDER — 0.9 % SODIUM CHLORIDE (POUR BTL) OPTIME
TOPICAL | Status: DC | PRN
  Administered 2023-04-07: 1000 mL

## 2023-04-07 MED ORDER — ROCURONIUM BROMIDE 10 MG/ML (PF) SYRINGE
PREFILLED_SYRINGE | INTRAVENOUS | Status: DC | PRN
  Administered 2023-04-07: 60 mg via INTRAVENOUS

## 2023-04-07 MED ORDER — VASOPRESSIN 20 UNIT/ML IV SOLN
INTRAVENOUS | Status: DC | PRN
  Administered 2023-04-07 (×4): 1 [IU] via INTRAVENOUS

## 2023-04-07 MED ORDER — ACETAMINOPHEN 10 MG/ML IV SOLN: 1000.0000 mg | Freq: Once | INTRAVENOUS | Status: DC | PRN

## 2023-04-07 MED ORDER — FENTANYL CITRATE (PF) 250 MCG/5ML IJ SOLN
INTRAMUSCULAR | Status: AC
  Filled 2023-04-07: qty 5

## 2023-04-07 MED ORDER — PHENYLEPHRINE 80 MCG/ML (10ML) SYRINGE FOR IV PUSH (FOR BLOOD PRESSURE SUPPORT)
PREFILLED_SYRINGE | INTRAVENOUS | Status: AC
  Filled 2023-04-07: qty 10

## 2023-04-07 SURGICAL SUPPLY — 63 items
AID PSTN UNV HD RSTRNT DISP (MISCELLANEOUS) ×2
BAG COUNTER SPONGE SURGICOUNT (BAG) IMPLANT
BAG SPNG CNTER NS LX DISP (BAG)
BNDG CMPR 5X4 CHSV STRCH STRL (GAUZE/BANDAGES/DRESSINGS) ×4
BNDG COHESIVE 4X5 TAN STRL (GAUZE/BANDAGES/DRESSINGS) ×2 IMPLANT
BNDG COHESIVE 4X5 TAN STRL LF (GAUZE/BANDAGES/DRESSINGS) IMPLANT
BNDG GAUZE DERMACEA FLUFF 4 (GAUZE/BANDAGES/DRESSINGS) ×4 IMPLANT
BNDG GZE DERMACEA 4 6PLY (GAUZE/BANDAGES/DRESSINGS)
BRUSH SCRUB EZ PLAIN DRY (MISCELLANEOUS) ×4 IMPLANT
CANNULA SHOULDER 7CM (CANNULA) IMPLANT
COVER MAYO STAND STRL (DRAPES) IMPLANT
COVER SURGICAL LIGHT HANDLE (MISCELLANEOUS) ×4 IMPLANT
DRAPE IMP U-DRAPE 54X76 (DRAPES) IMPLANT
DRAPE ORTHO SPLIT 77X108 STRL (DRAPES) ×8
DRAPE SURG ORHT 6 SPLT 77X108 (DRAPES) IMPLANT
DRAPE U-SHAPE 47X51 STRL (DRAPES) ×2 IMPLANT
DRESSING MEPILEX FLEX 4X4 (GAUZE/BANDAGES/DRESSINGS) IMPLANT
DRSG ADAPTIC 3X8 NADH LF (GAUZE/BANDAGES/DRESSINGS) ×2 IMPLANT
DRSG MEPILEX FLEX 4X4 (GAUZE/BANDAGES/DRESSINGS) ×12
DW OUTFLOW CASSETTE/TUBE SET (MISCELLANEOUS) IMPLANT
ELECT REM PT RETURN 9FT ADLT (ELECTROSURGICAL) ×2
ELECTRODE REM PT RTRN 9FT ADLT (ELECTROSURGICAL) IMPLANT
GAUZE PAD ABD 7.5X8 STRL (GAUZE/BANDAGES/DRESSINGS) IMPLANT
GAUZE SPONGE 4X4 12PLY STRL (GAUZE/BANDAGES/DRESSINGS) ×2 IMPLANT
GLOVE BIO SURGEON STRL SZ7.5 (GLOVE) ×2 IMPLANT
GLOVE BIO SURGEON STRL SZ8 (GLOVE) ×2 IMPLANT
GLOVE BIOGEL PI IND STRL 7.5 (GLOVE) ×2 IMPLANT
GLOVE BIOGEL PI IND STRL 8 (GLOVE) ×2 IMPLANT
GLOVE BIOGEL PI IND STRL 9 (GLOVE) ×2 IMPLANT
GLOVE SURG ORTHO LTX SZ7.5 (GLOVE) ×4 IMPLANT
GOWN STRL REUS W/ TWL LRG LVL3 (GOWN DISPOSABLE) ×4 IMPLANT
GOWN STRL REUS W/ TWL XL LVL3 (GOWN DISPOSABLE) ×2 IMPLANT
GOWN STRL REUS W/TWL LRG LVL3 (GOWN DISPOSABLE) ×4
GOWN STRL REUS W/TWL XL LVL3 (GOWN DISPOSABLE) ×2
HANDPIECE INTERPULSE COAX TIP (DISPOSABLE) ×2
KIT BASIN OR (CUSTOM PROCEDURE TRAY) ×2 IMPLANT
KIT TURNOVER KIT B (KITS) ×2 IMPLANT
MANIFOLD NEPTUNE II (INSTRUMENTS) ×2 IMPLANT
NS IRRIG 1000ML POUR BTL (IV SOLUTION) ×2 IMPLANT
PACK ORTHO EXTREMITY (CUSTOM PROCEDURE TRAY) ×2 IMPLANT
PAD ARMBOARD 7.5X6 YLW CONV (MISCELLANEOUS) ×4 IMPLANT
PADDING CAST COTTON 6X4 STRL (CAST SUPPLIES) ×2 IMPLANT
RESTRAINT HEAD UNIVERSAL NS (MISCELLANEOUS) IMPLANT
SET HNDPC FAN SPRY TIP SCT (DISPOSABLE) IMPLANT
SET IRRIG Y TYPE TUR BLADDER L (SET/KITS/TRAYS/PACK) IMPLANT
SOL PREP POV-IOD 4OZ 10% (MISCELLANEOUS) ×2 IMPLANT
SOL SCRUB PVP POV-IOD 4OZ 7.5% (MISCELLANEOUS) ×2
SOLUTION SCRB POV-IOD 4OZ 7.5% (MISCELLANEOUS) ×2 IMPLANT
SPONGE T-LAP 18X18 ~~LOC~~+RFID (SPONGE) ×2 IMPLANT
STOCKINETTE IMPERVIOUS 9X36 MD (GAUZE/BANDAGES/DRESSINGS) IMPLANT
STOCKINETTE IMPERVIOUS LG (DRAPES) IMPLANT
SUT ETHILON 3 0 PS 1 (SUTURE) IMPLANT
SUT PDS AB 2-0 CT1 27 (SUTURE) IMPLANT
SWAB COLLECTION DEVICE MRSA (MISCELLANEOUS) IMPLANT
SWAB CULTURE ESWAB REG 1ML (MISCELLANEOUS) IMPLANT
TAPE CLOTH SOFT 2X10 (GAUZE/BANDAGES/DRESSINGS) IMPLANT
TOWEL GREEN STERILE (TOWEL DISPOSABLE) ×4 IMPLANT
TOWEL GREEN STERILE FF (TOWEL DISPOSABLE) ×2 IMPLANT
TUBE CONNECTING 12X1/4 (SUCTIONS) ×2 IMPLANT
TUBING ARTHROSCOPY IRRIG 16FT (MISCELLANEOUS) IMPLANT
UNDERPAD 30X36 HEAVY ABSORB (UNDERPADS AND DIAPERS) ×2 IMPLANT
WATER STERILE IRR 1000ML POUR (IV SOLUTION) ×2 IMPLANT
YANKAUER SUCT BULB TIP NO VENT (SUCTIONS) ×2 IMPLANT

## 2023-04-07 NOTE — Anesthesia Procedure Notes (Signed)
Central Venous Catheter Insertion Performed by: Shelton Silvas, MD, anesthesiologist Start/End7/05/2023 8:50 AM, 04/07/2023 9:00 AM Patient location: Pre-op. Preanesthetic checklist: patient identified, IV checked, site marked, risks and benefits discussed, surgical consent, monitors and equipment checked, pre-op evaluation, timeout performed and anesthesia consent Position: Trendelenburg Lidocaine 1% used for infiltration and patient sedated Hand hygiene performed , maximum sterile barriers used  and Seldinger technique used Catheter size: 8 Fr Total catheter length 16. Central line was placed.Double lumen Procedure performed using ultrasound guided technique. Ultrasound Notes:anatomy identified, needle tip was noted to be adjacent to the nerve/plexus identified, no ultrasound evidence of intravascular and/or intraneural injection and image(s) printed for medical record Attempts: 1 Following insertion, dressing applied, line sutured and Biopatch. Post procedure assessment: blood return through all ports  Patient tolerated the procedure well with no immediate complications.

## 2023-04-07 NOTE — Progress Notes (Signed)
Progress Note  Patient Name: Peter [name] Date of Encounter: 04/07/2023  Primary Cardiologist:   Rollene Rotunda, MD   Subjective   Week.  Denies pain or SOB.   Status post drainage of the right knee and both shoulders.    Inpatient Medications    Scheduled Meds:  (feeding supplement) PROSource Plus  30 mL Oral BID BM   atorvastatin  10 mg Oral QHS   bupivacaine(PF)  10 mL Infiltration Once   Chlorhexidine Gluconate Cloth  6 each Topical Q0600   cinacalcet  180 mg Oral Q breakfast   ferric citrate  630 mg Oral TID WC   metoprolol tartrate  12.5 mg Oral BID   midodrine  10 mg Oral TID WC   multivitamin  1 tablet Oral QHS   pantoprazole (PROTONIX) IV  40 mg Intravenous Q12H   Continuous Infusions:  heparin 1,250 Units/hr (04/07/23 0700)   nafcillin IV 0 g (04/07/23 0408)   PRN Meds: acetaminophen, diltiazem, lidocaine (PF), mouth rinse, oxyCODONE   Vital Signs    Vitals:   04/07/23 1121 04/07/23 1130 04/07/23 1145 04/07/23 1200  BP: 108/66 112/64 100/66 (!) 99/59  Pulse: 96 83 88 83  Resp: (!) 25 20 (!) 21 18  Temp: (!) 97.5 F (36.4 C)   97.7 F (36.5 C)  TempSrc:      SpO2: 99% 97% 95% 93%  Weight:      Height:        Intake/Output Summary (Last 24 hours) at 04/07/2023 1312 Last data filed at 04/07/2023 1114 Gross per 24 hour  Intake 1609.26 ml  Output 10 ml  Net 1599.26 ml   Filed Weights   04/04/23 1029 04/04/23 1506 04/07/23 0530  Weight: 98.8 kg 97.4 kg 98.9 kg    Telemetry    Atrial fib with controlled rate - Personally Reviewed  ECG    NA - Personally Reviewed  Physical Exam   GEN:     Acutely ill appearing Neck: No  JVD Cardiac: Irregular RR, systolic murmur at the apex, no diastolic murmurs, rubs, or gallops.  Respiratory: Clear to auscultation bilaterally. GI: Soft, nontender, non-distended  MS:    Diffuse moderate edema.  Neuro:  Nonfocal  Psych: Normal affect   Labs    Chemistry Recent Labs  Lab 04/04/23 0441  04/06/23 0815 04/07/23 0701 04/07/23 1056  NA 127* 129* 128* 123*  K 4.1 5.3* 5.1 5.5*  CL 89* 90* 91* 93*  CO2 24 21* 21*  --   GLUCOSE 108* 64* 115* 79  BUN 77* 77* 96* 85*  CREATININE 8.18* 7.17* 8.02* 7.70*  CALCIUM 8.2* 7.3* 6.2*  --   PROT 5.9* 6.6 6.1*  --   ALBUMIN 1.7* <1.5* <1.5*  --   AST 230* 89* 64*  --   ALT 92* 40 29  --   ALKPHOS 48 80 68  --   BILITOT 3.4* 9.8* 10.3*  --   GFRNONAA 6* 7* 6*  --   ANIONGAP 14 18* 16*  --      Hematology Recent Labs  Lab 04/05/23 0315 04/06/23 0511 04/07/23 0701 04/07/23 1056  WBC 10.3 15.1* 16.8*  --   RBC 3.71* 3.72* 3.31*  --   HGB 12.4* 12.6* 10.8* 11.6*  HCT 35.4* 35.0* 30.7* 34.0*  MCV 95.4 94.1 92.7  --   MCH 33.4 33.9 32.6  --   MCHC 35.0 36.0 35.2  --   RDW 14.4 14.6 15.0  --  PLT 130* 185 205  --     Cardiac EnzymesNo results for input(s): "TROPONINI" in the last 168 hours. No results for input(s): "TROPIPOC" in the last 168 hours.   BNP Recent Labs  Lab 04/04/23 0441 04/05/23 0315 04/06/23 0511  BNP 112.2* 142.3* 183.4*     DDimer No results for input(s): "DDIMER" in the last 168 hours.   Radiology    DG FL ASP/INJ MAJOR (SHOULDER, HIP, KNEE)  Result Date: 04/05/2023 INDICATION: Right shoulder pain.  Concern for septic joint. EXAM: ASPIRATION OF LARGE JOINT COMPARISON:  MR shoulder right 04/04/2023 CONTRAST:  None FLUOROSCOPY: 1.30 mGy Kerma COMPLICATIONS: None immediate PROCEDURE: Informed written consent was obtained from the patient after discussion of the risks, benefits and alternatives to treatment. The patient was placed supine on the fluoroscopy table. The right was localized with fluoroscopy. The skin overlying the anterior aspect of the right shoulder was prepped and draped in usual sterile fashion. Approximately 3 mL of 1% lidocaine was used to anesthetize the skin. A 22 gauge spinal needle was advanced into the right shoulder joint at the medial superior aspect of the humeral head and an  image was saved for documentation purposes. Approximally 3-4 mL of yellow-green fluid was aspirated. The syringe was capped and sent to the laboratory for analysis as ordered by the clinical team. The needle was removed and a dressing was placed. The patient tolerated procedure well without immediate postprocedural complication. IMPRESSION: Successful fluoroscopic guided aspiration of the right shoulder. Procedure performed by: Mina Marble, PA-C Electronically Signed   By: Genevive Bi M.D.   On: 04/05/2023 14:45   DG FL ASP/INJ MAJOR (SHOULDER, HIP, KNEE)  Result Date: 04/05/2023 INDICATION: Left shoulder pain.  Concern for septic joint. EXAM: ARTHROCENTESIS/INJECTION OF LARGE JOINT COMPARISON:  MR shoulder left 04/03/2023 CONTRAST:  None FLUOROSCOPY: 1.70 mGy Kerma COMPLICATIONS: None immediate PROCEDURE: Informed written consent was obtained from the patient after discussion of the risks, benefits and alternatives to treatment. The patient was placed supine on the fluoroscopy table. The left was localized with fluoroscopy. The skin overlying the anterior aspect of the left shoulder was prepped and draped in usual sterile fashion. Approximately 3 mL of 1% lidocaine was used to anesthetize the skin. A 22 gauge spinal needle was advanced into the left shoulder joint at the medial superior aspect of the humeral head and an image was saved for documentation purposes. Approximally 1 mL of dark yellow fluid was aspirated. The syringe was capped and sent to the laboratory for analysis as ordered by the clinical team. The needle was removed and a dressing was placed. The patient tolerated procedure well without immediate postprocedural complication. IMPRESSION: Successful fluoroscopic guided aspiration of the left shoulder. Procedure performed by: Mina Marble, PA-C Electronically Signed   By: Genevive Bi M.D.   On: 04/05/2023 14:34    Cardiac Studies   Echo:  1. Left ventricular ejection fraction, by  estimation, is 60 to 65%. The  left ventricle has normal function. The left ventricle has no regional  wall motion abnormalities. There is severe left ventricular hypertrophy.  Left ventricular diastolic parameters   are indeterminate.   2. Right ventricular systolic function is normal. The right ventricular  size is normal.   3. Left atrial size was moderately dilated.   4. Mild appearing MR but some splay artifact cannot r/o some posterior  leaflet prolapse in addition to MAC with anteriorly directed MR Given  bacteremia and abnormal posterior mitral annulus suggest TEE to  further  assess if clnically indicated. The  mitral valve is degenerative. Mild mitral valve regurgitation. No evidence  of mitral stenosis. Moderate mitral annular calcification.   5. The aortic valve is tricuspid. There is moderate calcification of the  aortic valve. There is moderate thickening of the aortic valve. Aortic  valve regurgitation is not visualized. Mild aortic valve stenosis.   6. The inferior vena cava is dilated in size with >50% respiratory  variability, suggesting right atrial pressure of 8 mmHg.   Patient Profile     72 y.o. male with a hx of ESRD on HD MWF, type 2 dM, anemia of CKD, HTN, HLD, Mild MR who is being seen 04/04/2023 for the evaluation of atrial fibrillation, possible endocarditis at the request of Dr. Thedore Mins    Assessment & Plan    MSSA bacteremia/septic arthritis:  Possible endocarditis.  Plan is for TEE although the timing of this is yet to be decided.    Antibiotics per ID  Atrial fib:    Heparin on hold since surgery.  Resume possibly in AM wound hemostasis is achieved.    MR:  As above.      For questions or updates, please contact [facility] Please consult [email or web address] for contact info under Cardiology/STEMI.   Signed, Rollene Rotunda, MD  04/07/2023, 1:12 PM

## 2023-04-07 NOTE — Anesthesia Procedure Notes (Signed)
Procedure Name: Intubation Date/Time: 04/07/2023 9:25 AM  Performed by: Mayer Camel, CRNAPre-anesthesia Checklist: Patient identified, Emergency Drugs available, Suction available and Patient being monitored Patient Re-evaluated:Patient Re-evaluated prior to induction Oxygen Delivery Method: Circle System Utilized Preoxygenation: Pre-oxygenation with 100% oxygen Induction Type: IV induction Ventilation: Mask ventilation without difficulty Laryngoscope Size: Miller and 2 Grade View: Grade I Tube type: Oral Tube size: 7.5 mm Number of attempts: 1 Airway Equipment and Method: Stylet and Oral airway Placement Confirmation: ETT inserted through vocal cords under direct vision, positive ETCO2 and breath sounds checked- equal and bilateral Secured at: 22 cm Tube secured with: Tape Dental Injury: Teeth and Oropharynx as per pre-operative assessment

## 2023-04-07 NOTE — Progress Notes (Addendum)
NAME:  Peter [name], MRN:  [id number], DOB:  1951-06-06, LOS: 6 ADMISSION DATE:  04/01/2023, CONSULTATION DATE:  04/01/2023 REFERRING MD:  Dr. Thedore Mins - [facility], CHIEF COMPLAINT:   weakness/ fall/ NV   History of Present Illness:  66 yoM with PMH as below, significant for ESRD on MWF iHD via RUE AVF, afib on Eliquis, T2DM, anemia of CKD, HTN, HL, and OSA not on CPAP who presented with weakness from home. Started not to feel well on Sunday, 6/30 with nausea and vomiting. Sustained a fall, onto right knee and left shoulder pain. Admitted to [facility] on 7/2 for further workup with suspected sepsis, febrile 102.3 on admit. Blood cultures positive for MSSA bacteremia, source unclear at this time, being treated additionally for aspiration pneumonia, and mild transaminitis. Nephrology, ID, GI, and palliative care following. Ortho consulted and underwent aspiration of right knee showing many WBC, primarily PMNs. He underwent iHD 7/3 with 1.4L removed 7/3. TTE pending. Overnight, had intermittent low blood pressure episodes and patient is more sonolucent. Given concern for worsening sepsis, PCCM consulted.   Pertinent  Medical History  ESRD on MWF iHD via RUE AVF, afib on Eliquis, T2DM, anemia of CKD, HTN, HL, and OSA not on CPAP   Significant Hospital Events: Including procedures, antibiotic start and stop dates in addition to other pertinent events   7/2 admitted  7/3 iHD, right knee joint asp 7/8 to OR for I&D of right knee and bilateral shoulders.   Interim History / Subjective:  Post op remains a little groggy No complaints BP remain borderline, but seem to be improving as anesthesia wears off.   Objective   Blood pressure (!) 99/59, pulse 83, temperature 97.7 F (36.5 C), resp. rate 18, height 5\' 6"  (1.676 m), weight 98.9 kg, SpO2 93 %.        Intake/Output Summary (Last 24 hours) at 04/07/2023 1330 Last data filed at 04/07/2023 1114 Gross per 24 hour  Intake 1609.26 ml  Output 10 ml  Net 1599.26 ml    Filed Weights   04/04/23 1029 04/04/23 1506 04/07/23 0530  Weight: 98.8 kg 97.4 kg 98.9 kg    Examination:  General: overweight elderly male in NAD HEENT: [place]/AT, PERRL, no JVD Neuro: Somnolent, but easily arouses and interacts as expected.  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  ID following, appreciate assistance Continue naficillin Appreciate ortho: post op 7/8 If remains off pressors can tx out of ICU 7/9 Check H&H post op.   Aspiration PNA OSA- untreated  P: Continue supplemental oxygen for sat goal greater than 92 Encourage pulmonary hygiene Full code and would want all aggressive interventions including ventilator support if needed  ESRD on IHD MWF Anion gap metabolic acidosis P: Nephrology following, appreciate assistance IHD per nephrology For HD today  PAF  P: Continuous telemetry Optimize electrolytes Heparin on hold perioperatively. If OK with ortho we can restart 7/9 AM  Hyponatremia Hyperkalemia P: BMP now HD pending  Protein calorie malnutrition Hypoalbuminemia Physical  deconditioning Fall -Albumin less than 1.5 P: Optimize nutrition   Best Practice (right click and "Reselect all SmartList Selections" daily)   Diet/type: NPO DVT prophylaxis: SCD GI prophylaxis: PPI Lines: N/A Foley:  N/A Code Status:  full code Last date of multidisciplinary goals of care discussion: Full code with all  aggressive interventions per patient   Labs   CBC: Recent Labs  Lab 04/03/23 0140 04/04/23 0441 04/05/23 0315 04/06/23 0511 04/07/23 0701 04/07/23 1056  WBC 8.8 8.8 10.3 15.1* 16.8*  --   NEUTROABS 7.2 6.7 7.3 11.6* 12.9*  --   HGB 12.1* 11.7* 12.4* 12.6* 10.8* 11.6*  HCT 35.2* 33.5* 35.4* 35.0* 30.7* 34.0*  MCV 97.2 96.3 95.4 94.1 92.7  --   PLT 87* 98* 130* 185 205  --      Basic Metabolic Panel: Recent Labs  Lab 04/02/23 0555 04/03/23 2115 04/04/23 0441 04/06/23 0815 04/07/23 0701 04/07/23 1056  NA 131* 130* 127* 129* 128* 123*  K 5.2* 4.4 4.1 5.3* 5.1 5.5*  CL 90* 87* 89* 90* 91* 93*  CO2 21* 25 24 21* 21*  --   GLUCOSE 117* 143* 108* 64* 115* 79  BUN 58* 68* 77* 77* 96* 85*  CREATININE 7.96* 7.58* 8.18* 7.17* 8.02* 7.70*  CALCIUM 9.1 8.7* 8.2* 7.3* 6.2*  --   MG  --  2.2 2.3  --   --   --   PHOS 4.7*  --   --   --   --   --     GFR: Estimated Creatinine Clearance: 9.3 mL/min (A) (by C-G formula based on SCr of 7.7 mg/dL (H)). Recent Labs  Lab 04/01/23 0851 04/01/23 1448 04/01/23 2315 04/02/23 0340 04/04/23 0441 04/05/23 0315 04/06/23 0511 04/07/23 0701  PROCALCITON  --   --   --    < > 32.52 23.82 18.04 13.17  WBC 13.8*  --   --    < > 8.8 10.3 15.1* 16.8*  LATICACIDVEN 4.6* 4.0* 3.9*  --   --   --   --   --    < > = values in this interval not displayed.     Liver Function Tests: Recent Labs  Lab 04/01/23 1447 04/02/23 0555 04/03/23 1058 04/04/23 0441 04/06/23 0815 04/07/23 0701  AST 172*  --  158* 230* 89* 64*  ALT 65*  --  76* 92* 40 29  ALKPHOS 39  --  45 48 80 68  BILITOT 1.0  --  1.3* 3.4* 9.8* 10.3*  PROT 6.6  --  6.5 5.9* 6.6 6.1*  ALBUMIN 2.5* 2.3* 1.9* 1.7* <1.5* <1.5*    No results for input(s): "LIPASE", "AMYLASE" in the last 168 hours. Recent Labs  Lab 04/02/23 0340 04/03/23 1058  AMMONIA 28 20     ABG    Component Value Date/Time   HCO3 29.6 (H) 04/03/2023 1053   TCO2 22 04/07/2023 1056   O2SAT 85.2  04/03/2023 1053     Coagulation Profile: Recent Labs  Lab 04/01/23 0851 04/06/23 0511  INR 1.6* 1.7*     Cardiac Enzymes: No results for input(s): "CKTOTAL", "CKMB", "CKMBINDEX", "TROPONINI" in the last 168 hours.  HbA1C: Hgb A1c MFr Bld  Date/Time Value Ref Range Status  05/30/2018 02:49 AM 5.7 (H) 4.8 - 5.6 % Final    Comment:    (NOTE) Pre diabetes:          5.7%-6.4% Diabetes:              >  6.4% Glycemic control for   <7.0% adults with diabetes     CBG: Recent Labs  Lab 04/07/23 0544 04/07/23 0623 04/07/23 0849 04/07/23 1121 04/07/23 1246  GLUCAP 49* 131* 87 76 92    Review of Systems:   Please see the history of present illness. All other systems reviewed and are negative   Past Medical History:  He,  has a past medical history of Anemia in chronic kidney disease (CKD), CKD (chronic kidney disease) stage 5, GFR less than 15 ml/min (HCC), Diabetes mellitus without complication (HCC) (03/2012), ED (erectile dysfunction), Heart murmur, History of kidney stones, Hypercholesteremia, Hypertension, Iron deficiency, Kidney stone (2010), MVP (mitral valve prolapse), OSA (obstructive sleep apnea), Renal oncocytoma of left kidney (2010), and Syncope.   Surgical History:   Past Surgical History:  Procedure Laterality Date   A/V FISTULAGRAM Left 02/10/2018   Procedure: A/V FISTULAGRAM;  Surgeon: Nada Libman, MD;  Location: [facility];  Service: Cardiovascular;  Laterality: Left;   APPLICATION OF WOUND VAC Right 03/15/2021   Procedure: APPLICATION OF WOUND VAC;  Surgeon: Leonie Douglas, MD;  Location: [facility] OR;  Service: Vascular;  Laterality: Right;   AV FISTULA PLACEMENT Left 06/14/2016   Procedure: ARTERIOVENOUS (AV) FISTULA CREATION LEFT ARM;  Surgeon: Chuck Hint, MD;  Location: [facility] OR;  Service: Vascular;  Laterality: Left;   AV FISTULA PLACEMENT Right 06/09/2018   Procedure: CREATION Right arm Brachiocephalic;  Surgeon: Maeola Harman,  MD;  Location: [facility] OR;  Service: Vascular;  Laterality: Right;   AV FISTULA PLACEMENT Right 01/16/2023   Procedure: INSERTION OF RIGHT ARM ARTERIOVENOUS (AV) GORE-TEX GRAFT;  Surgeon: Leonie Douglas, MD;  Location: [facility] OR;  Service: Vascular;  Laterality: Right;   BASCILIC VEIN TRANSPOSITION Right 09/10/2018   Procedure: CEPHALIC VEIN TRANSPOSITION;  Surgeon: Maeola Harman, MD;  Location[facility] OR;  Service: Vascular;  Laterality: Right;   BASCILIC VEIN TRANSPOSITION Left 01/25/2021   Procedure: LEFT ARMFIRST STAGE BASCILIC VEIN TRANSPOSITION;  Surgeon: Leonie Douglas, MD;  Location: [facility] OR;  Service: Vascular;  Laterality: Left;  PERIPHERAL NERVE BLOCK   BASCILIC VEIN TRANSPOSITION Left 03/15/2021   Procedure: LEFT UPPER ARM SECOND STAGE BASILIC TRANSPOSITION;  Surgeon: Leonie Douglas, MD;  Location: [facility] OR;  Service: Vascular;  Laterality: Left;   COLONOSCOPY     INSERTION OF DIALYSIS CATHETER N/A 06/09/2018   Procedure: INSERTION OF DIALYSIS CATHETER;  Surgeon: Maeola Harman, MD;  Location: [facility] OR;  Service: Vascular;  Laterality: N/A;   JOINT REPLACEMENT Left 2010   hip   PARTIAL NEPHRECTOMY Left 2010   PERIPHERAL VASCULAR INTERVENTION Left 02/10/2018   Procedure: PERIPHERAL VASCULAR INTERVENTION;  Surgeon: Nada Libman, MD;  Location: [facility];  Service: Cardiovascular;  Laterality: Left;   TONSILLECTOMY       Social History:   reports that he quit smoking about 36 years ago. His smoking use included cigarettes. His smokeless tobacco use includes chew. He reports that he does not drink alcohol and does not use drugs.   Family History:  His family history includes Dementia in his mother; Hypertension in his father.   Allergies Allergies  Allergen Reactions   Doxazosin Mesylate     ineffective for BP (10/2014)   Triamterene-Hctz     increased creatinine (see 09/2014 office note)     Home Medications  Prior to Admission medications   Medication Sig  Start Date End Date Taking? Authorizing Provider  acetaminophen (TYLENOL) 500  MG tablet Take 1,000 mg by mouth as needed for mild pain or moderate pain.   Yes [provider]  apixaban (ELIQUIS) 5 MG TABS tablet Take 1 tablet (5 mg total) by mouth 2 (two) times daily. 02/26/23  Yes Rollene Rotunda, MD  atorvastatin (LIPITOR) 10 MG tablet Take 10 mg by mouth at bedtime.   Yes [provider]  AURYXIA 1 GM 210 MG(Fe) tablet Take 420-630 mg by mouth See admin instructions. Take 630mg  by mouth three times daily with meals and take 420mg  by mouth with snacks.   Yes [provider]  VITAMIN D, CHOLECALCIFEROL, PO Take 1 tablet by mouth once a week. Patient had Dialysis on Monday, Wednesday, and Friday. 03/24/23 03/22/24 Yes [provider]     Critical care time: 32 min    Joneen Roach, AGACNP-BC [facility]  See Amion for personal pager PCCM on call pager [phone] until 7pm. Please call Elink 7p-7a. [phone]  04/07/2023 1:35 PM

## 2023-04-07 NOTE — Progress Notes (Signed)
[facility] Progress Note   Subjective:  went to the OR this am for I&D of both shoulders and the R knees. Back in room, a bit drowsy post op. No new c/o's.   Objective Vitals:   04/07/23 1415 04/07/23 1430 04/07/23 1445 04/07/23 1500  BP: (!) 105/55 (!) 107/50 114/71 (!) 92/51  Pulse: 77 84 93 72  Resp: 20 19 18 18   Temp:      TempSrc:      SpO2: 94% 95% (!) 88% 97%  Weight:      Height:       Physical Exam General: Ill appearing man, post op is drowsy Heart:Irregularly irregular Lungs: CTA anteriorly, poor effort Abdomen: soft Extremities: 1+ BLE edema; 1+ RUE edema Dialysis Access: R AVG + bruit     Dialysis Orders:  MWF at [facility] 4hr  450/800     97.5kg    3K/2Ca   AVG   15g   Heparin 10,000 bolus - no ESA, Hgb > 12 - No VDRA (recently stopped d/t Ca 10.9)   Assessment/ Plan Sepsis due to aspiration pneumonia, septic joints: Currently on antibiotics, requiring oxygenation. Per ID/ primary team. MSSA bacteremia: 1st set Blood Cx 7/2 positive w/ aspirate from knee also + for staph aureus.  AVG without signs of infection. ID following, remains on nafcillin. Plan for TEE.  Shoulder/Knee effusion.  R knee aspirate with ^ WBC: Likely septic arthritis. Also concern for shoulder effusion. Ortho following, likely washout of joints at some point  ESRD: Continue HD on usual MWF schedule.  Next HD tonight in ICU Anemia of CKD: Hemoglobin above goal.  No need for ESA. 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  Continue Sensipar and Auryxia.  Monitor lab. HTN/volume: BP okay.  Off of antihypertensives. +edema, 2kg up Elevated LFTs A-Fib  [name]  MD  [facility] 04/07/2023, 3:54 PM  Recent Labs  Lab 04/02/23 0555 04/03/23 0140 04/07/23 0701 04/07/23 1056 04/07/23 1453  HGB  --    < > 10.8* 11.6* 10.6*  ALBUMIN 2.3*   < > <1.5*  --  <1.5*  CALCIUM 9.1   < > 6.2*  --  6.3*  PHOS 4.7*  --   --   --  11.3*  CREATININE 7.96*   < > 8.02* 7.70* 8.13*  K  5.2*   < > 5.1 5.5* 5.5*   < > = values in this interval not displayed.    Inpatient medications:  (feeding supplement) PROSource Plus  30 mL Oral BID BM   atorvastatin  10 mg Oral QHS   bupivacaine(PF)  10 mL Infiltration Once   Chlorhexidine Gluconate Cloth  6 each Topical Q0600   cinacalcet  180 mg Oral Q breakfast   ferric citrate  630 mg Oral TID WC   metoprolol tartrate  12.5 mg Oral BID   midodrine  10 mg Oral TID WC   multivitamin  1 tablet Oral QHS   pantoprazole (PROTONIX) IV  40 mg Intravenous Q12H    nafcillin IV 0 g (04/07/23 0408)   acetaminophen, diltiazem, lidocaine (PF), mouth rinse, oxyCODONE      Additional Objective Labs: Basic Metabolic Panel: Recent Labs  Lab 04/02/23 0555 04/03/23 2115 04/04/23 0441 04/06/23 0815 04/07/23 0701 04/07/23 1056  NA 131*   < > 127* 129* 128* 123*  K 5.2*   < > 4.1 5.3* 5.1 5.5*  CL 90*   < > 89* 90* 91* 93*  CO2 21*   < >  24 21* 21*  --   GLUCOSE 117*   < > 108* 64* 115* 79  BUN 58*   < > 77* 77* 96* 85*  CREATININE 7.96*   < > 8.18* 7.17* 8.02* 7.70*  CALCIUM 9.1   < > 8.2* 7.3* 6.2*  --   PHOS 4.7*  --   --   --   --   --    < > = values in this interval not displayed.    Liver Function Tests: Recent Labs  Lab 04/04/23 0441 04/06/23 0815 04/07/23 0701  AST 230* 89* 64*  ALT 92* 40 29  ALKPHOS 48 80 68  BILITOT 3.4* 9.8* 10.3*  PROT 5.9* 6.6 6.1*  ALBUMIN 1.7* <1.5* <1.5*    CBC: Recent Labs  Lab 04/04/23 0441 04/05/23 0315 04/06/23 0511 04/07/23 0701 04/07/23 1056 04/07/23 1453  WBC 8.8 10.3 15.1* 16.8*  --  19.5*  NEUTROABS 6.7 7.3 11.6* 12.9*  --   --   HGB 11.7* 12.4* 12.6* 10.8* 11.6* 10.6*  HCT 33.5* 35.4* 35.0* 30.7* 34.0* 31.2*  MCV 96.3 95.4 94.1 92.7  --  95.4  PLT 98* 130* 185 205  --  204    Blood Culture    Component Value Date/Time   SDES SYNOVIAL 04/07/2023 1020   SPECREQUEST NONE 04/07/2023 1020   CULT PENDING 04/07/2023 1020   REPTSTATUS PENDING 04/07/2023 1020    Studies/Results: DG CHEST PORT 1 VIEW  Result Date: 04/07/2023 CLINICAL DATA:  Central line placement. EXAM: PORTABLE CHEST 1 VIEW COMPARISON:  Chest radiograph dated 04/02/2023. FINDINGS: Left IJ central venous line with tip at the junction of the innominate vein and SVC. Shallow inspiration with bibasilar atelectasis. No focal consolidation, pleural effusion or pneumothorax. Stable cardiomegaly. Atherosclerotic calcification of the aorta. No acute osseous pathology. Degenerative changes of the spine and shoulders. IMPRESSION: Left IJ central venous line with tip at the junction of the innominate vein and SVC. No pneumothorax. Electronically Signed   By: Elgie Collard M.D.   On: 04/07/2023 15:29   Medications:  nafcillin IV 0 g (04/07/23 0408)    (feeding supplement) PROSource Plus  30 mL Oral BID BM   atorvastatin  10 mg Oral QHS   bupivacaine(PF)  10 mL Infiltration Once   Chlorhexidine Gluconate Cloth  6 each Topical Q0600   cinacalcet  180 mg Oral Q breakfast   ferric citrate  630 mg Oral TID WC   metoprolol tartrate  12.5 mg Oral BID   midodrine  10 mg Oral TID WC   multivitamin  1 tablet Oral QHS   pantoprazole (PROTONIX) IV  40 mg Intravenous Q12H     Tomasa Blase PA-C [facility] 04/07/2023,3:48 PM

## 2023-04-07 NOTE — Progress Notes (Signed)
Hypoglycemic Event  CBG: 49 at 0545  Treatment: D50 50 mL (25 gm) at 0600  Symptoms: None  Follow-up CBG: [id number] CBG Result:131  Possible Reasons for Event: Inadequate meal intake     Talbot Monarch, Augustina Mood

## 2023-04-07 NOTE — Progress Notes (Signed)
ANTICOAGULATION CONSULT NOTE - follow-up  Pharmacy Consult for heparin Indication: atrial fibrillation  Allergies  Allergen Reactions   Doxazosin Mesylate     ineffective for BP (10/2014)   Triamterene-Hctz     increased creatinine (see 09/2014 office note)    Patient Measurements: Height: 5\' 6"  (167.6 cm) Weight: 98.9 kg (218 lb 0.6 oz) IBW/kg (Calculated) : 63.8 Heparin Dosing Weight: 85.6 kg  Vital Signs: Temp: 97.7 F (36.5 C) (07/08 1200) Temp Source: Oral (07/08 0734) BP: 99/59 (07/08 1200) Pulse Rate: 83 (07/08 1200)  Labs: Recent Labs    04/05/23 0315 04/05/23 1458 04/06/23 0511 04/06/23 0815 04/06/23 1948 04/07/23 0701 04/07/23 1056  HGB 12.4*  --  12.6*  --   --  10.8* 11.6*  HCT 35.4*  --  35.0*  --   --  30.7* 34.0*  PLT 130*  --  185  --   --  205  --   APTT 61* 98*  --   --  169* >200*  --   LABPROT  --   --  19.9*  --   --   --   --   INR  --   --  1.7*  --   --   --   --   HEPARINUNFRC 0.47 0.51 0.41  --   --  0.58  --   CREATININE  --   --   --  7.17*  --  8.02* 7.70*     Estimated Creatinine Clearance: 9.3 mL/min (A) (by C-G formula based on SCr of 7.7 mg/dL (H)).   Assessment: 29 YOM w/ prior history of AF requiring heparin gtt. Last dose of apixaban given ~10 AM on 04/02/2023. Patient had fluoro guided right and left shoulder aspiration on 7/6. Plans are for joint washout this week  Heparin level= 0.47 (has been stable on 1250- 1350 units/hr. Elevated T bili can cause falsely low levels but heparin levels have been stable -aPTT= 200 after heparin infusion decreased (elevated possibly due to liver dysfunction; T bili= 10.3 w/ trend up) -Hg stable  Goal of Therapy:  Heparin level 0.3-0.7 units/ml aPTT 66-102 seconds Monitor platelets by anticoagulation protocol: Yes   Plan:  Continue heparin 250 units/hr Daily heparin level, aPTT and CBC  Harland German, PharmD Clinical Pharmacist **Pharmacist phone directory can now be found on  [email or web address] ([email or web address]).  Listed under [facility].

## 2023-04-07 NOTE — Progress Notes (Signed)
Abnormal Ca - MD notified

## 2023-04-07 NOTE — Progress Notes (Addendum)
Patient came in short stay with Heparin drip at 12.5 ml/hr. Per Dr. Carola Frost verbal order the Heparin drip was stopped at 07:49 o'clock.

## 2023-04-07 NOTE — Transfer of Care (Signed)
Immediate Anesthesia Transfer of Care Note  Patient: Peter [name]  Procedure(s) Performed: IRRIGATION AND DEBRIDEMENT SHOULDERS (Bilateral: Shoulder) IRRIGATION AND DEBRIDEMENT RIGHT KNEE (Right: Knee)  Patient Location: PACU  Anesthesia Type:General  Level of Consciousness: awake, alert , and oriented  Airway & Oxygen Therapy: Patient Spontanous Breathing and Patient connected to face mask oxygen  Post-op Assessment: Report given to RN and Post -op Vital signs reviewed and stable  Post vital signs: Reviewed and stable  Last Vitals:  Vitals Value Taken Time  BP 112/64 04/07/23 1130  Temp    Pulse 99 04/07/23 1133  Resp 22 04/07/23 1133  SpO2 97 % 04/07/23 1133  Vitals shown include unvalidated device data.  Last Pain:  Vitals:   04/07/23 0734  TempSrc: Oral  PainSc:       Patients Stated Pain Goal: 0 (04/03/23 2149)  Complications: No notable events documented.

## 2023-04-07 NOTE — Anesthesia Postprocedure Evaluation (Signed)
Anesthesia Post Note  Patient: Peter [name]  Procedure(s) Performed: IRRIGATION AND DEBRIDEMENT SHOULDERS (Bilateral: Shoulder) IRRIGATION AND DEBRIDEMENT RIGHT KNEE (Right: Knee)     Patient location during evaluation: PACU Anesthesia Type: General Level of consciousness: awake and alert Pain management: pain level controlled Vital Signs Assessment: post-procedure vital signs reviewed and stable Respiratory status: spontaneous breathing, nonlabored ventilation, respiratory function stable and patient connected to nasal cannula oxygen Cardiovascular status: blood pressure returned to baseline and stable Postop Assessment: no apparent nausea or vomiting Anesthetic complications: no  No notable events documented.  Last Vitals:  Vitals:   04/07/23 1445 04/07/23 1500  BP: 114/71 (!) 92/51  Pulse: 93 72  Resp: 18 18  Temp:    SpO2: (!) 88% 97%    Last Pain:  Vitals:   04/07/23 1145  TempSrc:   PainSc: 0-No pain                 Shelton Silvas

## 2023-04-07 NOTE — Anesthesia Preprocedure Evaluation (Addendum)
Anesthesia Evaluation  Patient identified by MRN, date of birth, ID band Patient awake    Reviewed: Allergy & Precautions, NPO status , Patient's Chart, lab work & pertinent test results  Airway Mallampati: II  TM Distance: <3 FB Neck ROM: Full    Dental  (+) Poor Dentition, Missing, Dental Advisory Given   Pulmonary sleep apnea , former smoker   breath sounds clear to auscultation       Cardiovascular hypertension, + Peripheral Vascular Disease  + Valvular Problems/Murmurs MVP  Rhythm:Irregular Rate:Normal     Neuro/Psych  Headaches  negative psych ROS   GI/Hepatic negative GI ROS, Neg liver ROS,,,  Endo/Other  diabetes, Type 2    Renal/GU ESRF and DialysisRenal disease     Musculoskeletal  (+) Arthritis ,    Abdominal   Peds  Hematology   Anesthesia Other Findings   Reproductive/Obstetrics                             Anesthesia Physical Anesthesia Plan  ASA: 3  Anesthesia Plan: General   Post-op Pain Management: Tylenol PO (pre-op)*   Induction: Intravenous  PONV Risk Score and Plan: 3 and Ondansetron, Dexamethasone and Midazolam  Airway Management Planned: Oral ETT  Additional Equipment: None  Intra-op Plan:   Post-operative Plan: Extubation in OR  Informed Consent: I have reviewed the patients History and Physical, chart, labs and discussed the procedure including the risks, benefits and alternatives for the proposed anesthesia with the patient or authorized representative who has indicated his/her understanding and acceptance.     Dental advisory given  Plan Discussed with: CRNA  Anesthesia Plan Comments:        Anesthesia Quick Evaluation

## 2023-04-07 NOTE — Progress Notes (Signed)
Patient ID: Peter [name], male   DOB: 1951/02/20, 72 y.o.   MRN: [id number]    Progress Note from the Palliative Medicine Team at [facility]   Patient Name: Peter [name]        Date: 04/07/2023 DOB: [birth year]  Age: 72 y.o. MRN#: [id number] Attending Physician: Josephine Igo, DO Primary Care Physician: [name], MD Admit Date: 04/01/2023    Extensive chart review has been completed prior to meeting with patient/family  including labs, vital signs, imaging, progress/consult notes, orders, medications and available advance directive documents.   88 yoM with PMH as below, significant for ESRD on MWF iHD via RUE AVF, afib on Eliquis, T2DM, anemia of CKD, HTN, HL, and OSA not on CPAP who presented with weakness from home. Started not to feel well on Sunday, 6/30 with nausea and vomiting. Sustained a fall, onto right knee and left shoulder pain. Admitted to [facility] on 7/2 for further workup with suspected sepsis, febrile 102.3 on admit. Blood cultures positive for MSSA bacteremia, source unclear at this time, being treated additionally for aspiration pneumonia, and mild transaminitis. Nephrology, ID, GI, and palliative care following. Ortho consulted and underwent aspiration of right knee showing many WBC, primarily PMNs. He underwent iHD 7/3 with 1.4L removed 7/3. TTE pending. Overnight, had intermittent low blood pressure episodes and patient is more sonolucent. Transferred to ICU   This NP assessed patient at the bedside as a follow up for palliative medicine needs and emotional support.  Significant Other/ Sarah at bedside   Patient remains lethargic, recently returned from the OR status post I&D of right knee and bilateral shoulders.  Continued education and conversation regarding patient's multiple co-morbidities; end-stage renal disease/dialysis dependent, sepsis with MRSA bacteremia, protein calorie malnutrition and physical deconditioning.  Continue to treat the treatable, all  are hopeful for improvement.     Education offered today regarding  the importance of continued conversation with family and their  medical providers regarding overall plan of care and treatment options,  ensuring decisions are within the context of the patients values and GOCs.   Questions and concerns addressed     Time: 25 minutes  Detailed review of medical records ( labs, imaging, vital signs), medically appropriate exam ( MS, skin, resp)   discussed with treatment team, counseling and education to patient, family, staff, documenting clinical information, medication management, coordination of care    Lorinda Creed NP  Palliative Medicine Team Team Phone # [phone] Pager [phone]

## 2023-04-07 NOTE — Progress Notes (Signed)
[facility]   Paged by nurse several times since pt has arrived back on the unit regarding continued oozing/bleeding from R shoulder posterior portal   Several dressing changes have been performed along with various interventions  I was up on the unit earlier this afternoon around 1500 and placed a pressure dressing over the posterior R shoulder portal while we awaited a cellulose dressing.  Once that arrive the RN placed the cellulose dressing along with 4x4's, abd and tape.  Around 1800 I was paged again stating the dressing was saturated   At this point I decided to place a new suture. Instead of a simple suture I placed a vertical mattress suture. Verbal consent was obtained from the patient    I did create a sterile field around the portal using CHG prep. The simple suture was cut out and a vertical mattress suture place.  I also obtained thrombin spray from the OR pyxis. This was reconstituted at bedside and placed onto a 4x4 gauze.  After the new suture was placed the thrombin saturated gauze was placed over the wound. Once there was hemostasis I did place dermabond over the incision.  Then clean, sterile 4x4 gauze and medipore tape was applied in a pressure dressing type fashion.  Ice pack placed back along the posterior shoulder.   Pt tolerated well.  Continue to reinforce as needed.    Suspect the continued oozing is multifactorial due to his critical illness, dialysis needs, being on heparin and hyperemic response of his R shoulder due to severe infection.  Continue to monitor.  At this point would just continue to reinforce as needed.    Mearl Latin, PA-C [phone] (C) 04/07/2023, 6:51 PM  [facility] [address] [place] [address] [phone] (F)       Patient ID: Peter [name], male   DOB: 13-Aug-1951, 72 y.o.   MRN: [id number]

## 2023-04-07 NOTE — Progress Notes (Signed)
eLink Physician-Brief Progress Note Patient Name: Peter [name] DOB: Jun 17, 1951 MRN: [id number]   Date of Service  04/07/2023  HPI/Events of Note  72 year old male with a history of end-stage renal disease on intermittent hemodialysis, paroxysmal atrial fibrillation on anticoagulation and left glenohumeral joint infection status post operative debridement today who returns from the operating room with softer blood pressures.  Has known MSSA bacteremia on appropriate antibiotics nafcillin/cefazolin.  Stable CBC with 1 g drop.  Critical calcium earlier in the day.  eICU Interventions  Add midodrine.  700 cc positive today, more than 1 L positive yesterday, hold additional fluid resuscitation  Backup norepinephrine to maintain MAP greater than 65  Add on calcium infusion     Intervention Category Intermediate Interventions: Hypotension - evaluation and management  Kynadi Dragos 04/07/2023, 9:14 PM

## 2023-04-07 NOTE — Progress Notes (Addendum)
No significant changes overnight. Now complains of right wrist pain also. Tender and pain with ROM but no redness or increase in warmth.  The risks and benefits of surgery for incision and drainage of the right knee and both shoulders were discussed with the patient, including the possibility of infection, nerve injury, vessel injury, wound breakdown, arthritis, symptomatic hardware, DVT/ PE, loss of motion, malunion, nonunion, and need for further surgery among others.  We also specifically discussed the possible need to repeat debridements if necessary.  These risks were acknowledged and consent provided to proceed.  Myrene Galas, MD [facility], [facility] [place] [phone]

## 2023-04-07 NOTE — Progress Notes (Addendum)
R shoulder surgical site dressing became saturated with blood. Site also has a continuous ooze. Dressing changed and saturated within 10 minutes. Montez Morita, Ortho PA notified. Advised to replace mepilex with a ABD dressing and tape.    1600: CCM also notified of bleeding, advised to message ortho again after numerous dressing changes. Montez Morita notified and arrived at bedside, Surgicel ordered. No other orders at this time  1730: Surgicel tubed up from pharmacy and placed on pt with ABD pad and pressure tape. Dressing already saturated, reinforced further. PA notified, topical TXA ordered.   1830: Montez Morita, PA at bedside.

## 2023-04-07 NOTE — Progress Notes (Signed)
[facility]  Date of Admission:  04/01/2023      Lines: 7/8-c left internal jugular cvc   Abx: 7/04-c nafcillin  7/07 intraarticular tobramycin 7/04 amp/sulbactam 7/02-03 cefazolin  ASSESSMENT: 72 yo male with esrd on iHD (right UE avg), p-afib on anticoagulation, admitted 7/02 with sepsis in setting disseminated mssa infection/bsi  Pain right knee/bilateral shoulder/right wrist on presentation and ?neck  mri showed effusion bilateral glenohumeral joint and left  xray showed right knee effusion/arthritis Right wrist ct showed mild effusion Tte with question of MAC vs true vegetation  S/p right knee aspiration 7/03 -- cx mssa S/p IR bilateral shoulder arthrocentesis 7/06 -- cx mssa on right but not left  7/2 blood cx mssa 7/4 blood cx ngtd   ------- 7/8 assessment Transferred to icu 7/07 for soft blood pressure  S/p I&D of bilateral shoulder and right knee 7/08. Attempt to aspirate the right wrist doesn't yield any fluid  No sign of rue avg infection  Mentation better but slightly more drowsy after procedure surgery today.  On nafcillin since 7/4 for concerned of ams. Brain mri not yet done    PLAN: Will reevaluate tomorrow and see if feasible to get brain mri -- if negative will switch nafcillin back to cefazolin Appreciate surgical and pulm/ccm management of patient Final abx plan dependent on evolution the next few days I don't see absolute need for tee at this time. He'll need at least 6 weeks abx starting after all surgeries done Discussed with primary team   I spent more than 50 minute reviewing data/chart, and coordinating care, providing direct face to face time providing counseling/discussing diagnostics/treatment plan with patient and treatment team   Principal Problem:   MSSA bacteremia Active Problems:   Obstructive sleep apnea   Essential hypertension, benign   MVP (mitral valve prolapse)   ESRD on  dialysis (HCC)   Anemia in chronic kidney disease   PAF (paroxysmal atrial fibrillation) (HCC)   Aspiration pneumonia (HCC)   Staphylococcal arthritis of right knee (HCC)   Staphylococcal arthritis of right wrist (HCC)   Staphylococcal arthritis of left shoulder (HCC)   Staphylococcal arthritis of right shoulder (HCC)   Sepsis (HCC)   Allergies  Allergen Reactions   Doxazosin Mesylate     ineffective for BP (10/2014)   Triamterene-Hctz     increased creatinine (see 09/2014 office note)    Scheduled Meds:  (feeding supplement) PROSource Plus  30 mL Oral BID BM   atorvastatin  10 mg Oral QHS   bupivacaine(PF)  10 mL Infiltration Once   Chlorhexidine Gluconate Cloth  6 each Topical Q0600   cinacalcet  180 mg Oral Q breakfast   ferric citrate  630 mg Oral TID WC   metoprolol tartrate  12.5 mg Oral BID   midodrine  10 mg Oral TID WC   multivitamin  1 tablet Oral QHS   oxidized cellulose  1 each Topical Once   pantoprazole (PROTONIX) IV  40 mg Intravenous Q12H   Continuous Infusions:  nafcillin IV 0 g (04/07/23 0408)   PRN Meds:.acetaminophen, diltiazem, lidocaine (PF), mouth rinse, oxyCODONE   SUBJECTIVE: Nursing reports improving metation No n/v/diarrhea  Back after bilateral shoulder surgery and right knee surgery  Right wrist tap no fluid  No headache    Review of Systems: ROS All other ROS was negative, except mentioned above     OBJECTIVE: Vitals:   04/07/23 1415 04/07/23 1430 04/07/23 1445  04/07/23 1500  BP: (!) 105/55 (!) 107/50 114/71 (!) 92/51  Pulse: 77 84 93 72  Resp: 20 19 18 18   Temp:      TempSrc:      SpO2: 94% 95% (!) 88% 97%  Weight:      Height:       Body mass index is 35.19 kg/m.  Physical Exam General/constitutional: no distress, somnolent; just got back from surgery HEENT: Normocephalic, bilateral periorbital edema CV: rrr no mrg Lungs: clear to auscultation, normal respiratory effort Abd: Soft, Nontender Ext: no  edema Skin: right upper ext avg felt and nontender Neuro: somnolent MSK: bilateral shoulder, right knee dressing clean/dry  Central line presence: left internal jugular cvc site clean   Lab Results Lab Results  Component Value Date   WBC 19.5 (H) 04/07/2023   HGB 10.6 (L) 04/07/2023   HCT 31.2 (L) 04/07/2023   MCV 95.4 04/07/2023   PLT 204 04/07/2023    Lab Results  Component Value Date   CREATININE 8.13 (H) 04/07/2023   BUN 98 (H) 04/07/2023   NA 130 (L) 04/07/2023   K 5.5 (H) 04/07/2023   CL 90 (L) 04/07/2023   CO2 20 (L) 04/07/2023    Lab Results  Component Value Date   ALT 25 04/07/2023   AST 65 (H) 04/07/2023   ALKPHOS 65 04/07/2023   BILITOT 11.8 (H) 04/07/2023      Microbiology: Recent Results (from the past 240 hour(s))  Resp panel by RT-PCR (RSV, Flu A&B, Covid) Anterior Nasal Swab     Status: None   Collection Time: 04/01/23  8:23 AM   Specimen: Anterior Nasal Swab  Result Value Ref Range Status   SARS Coronavirus 2 by RT PCR NEGATIVE NEGATIVE Final   Influenza A by PCR NEGATIVE NEGATIVE Final   Influenza B by PCR NEGATIVE NEGATIVE Final    Comment: (NOTE) The Xpert Xpress SARS-CoV-2/FLU/RSV plus assay is intended as an aid in the diagnosis of influenza from Nasopharyngeal swab specimens and should not be used as a sole basis for treatment. Nasal washings and aspirates are unacceptable for Xpert Xpress SARS-CoV-2/FLU/RSV testing.  Fact Sheet for Patients: BloggerCourse.com  Fact Sheet for Healthcare Providers: SeriousBroker.it  This test is not yet approved or cleared by the [place] FDA and has been authorized for detection and/or diagnosis of SARS-CoV-2 by FDA under an Emergency Use Authorization (EUA). This EUA will remain in effect (meaning this test can be used) for the duration of the COVID-19 declaration under Section 564(b)(1) of the Act, 21 U.S.C. section 360bbb-3(b)(1), unless the  authorization is terminated or revoked.     Resp Syncytial Virus by PCR NEGATIVE NEGATIVE Final    Comment: (NOTE) Fact Sheet for Patients: BloggerCourse.com  Fact Sheet for Healthcare Providers: SeriousBroker.it  This test is not yet approved or cleared by the [place] FDA and has been authorized for detection and/or diagnosis of SARS-CoV-2 by FDA under an Emergency Use Authorization (EUA). This EUA will remain in effect (meaning this test can be used) for the duration of the COVID-19 declaration under Section 564(b)(1) of the Act, 21 U.S.C. section 360bbb-3(b)(1), unless the authorization is terminated or revoked.  Performed at [facility], [address], [place], [place] [address]   Blood Culture (routine x 2)     Status: Abnormal   Collection Time: 04/01/23  8:51 AM   Specimen: BLOOD LEFT FOREARM  Result Value Ref Range Status   Specimen Description BLOOD LEFT FOREARM  Final  Special Requests   Final    BOTTLES DRAWN AEROBIC AND ANAEROBIC Blood Culture results may not be optimal due to an excessive volume of blood received in culture bottles   Culture  Setup Time   Final    GRAM POSITIVE COCCI IN CLUSTERS IN BOTH AEROBIC AND ANAEROBIC BOTTLES CRITICAL RESULT CALLED TO, READ BACK BY AND VERIFIED WITH: PHARMD HAILEY VONDOLEN ON 04/01/23 @ 2017 BY DRT Performed at [facility], [address] [place], [place] [address]    Culture STAPHYLOCOCCUS AUREUS (A)  Final   Report Status 04/03/2023 FINAL  Final   Organism ID, Bacteria STAPHYLOCOCCUS AUREUS  Final      Susceptibility   Staphylococcus aureus - MIC*    CIPROFLOXACIN <=0.5 SENSITIVE Sensitive     ERYTHROMYCIN <=0.25 SENSITIVE Sensitive     GENTAMICIN <=0.5 SENSITIVE Sensitive     OXACILLIN <=0.25 SENSITIVE Sensitive     TETRACYCLINE <=1 SENSITIVE Sensitive     VANCOMYCIN 1 SENSITIVE Sensitive     TRIMETH/SULFA <=10 SENSITIVE Sensitive      CLINDAMYCIN <=0.25 SENSITIVE Sensitive     RIFAMPIN <=0.5 SENSITIVE Sensitive     Inducible Clindamycin NEGATIVE Sensitive     LINEZOLID 2 SENSITIVE Sensitive     * STAPHYLOCOCCUS AUREUS  Blood Culture ID Panel (Reflexed)     Status: Abnormal   Collection Time: 04/01/23  8:51 AM  Result Value Ref Range Status   Enterococcus faecalis NOT DETECTED NOT DETECTED Final   Enterococcus Faecium NOT DETECTED NOT DETECTED Final   Listeria monocytogenes NOT DETECTED NOT DETECTED Final   Staphylococcus species DETECTED (A) NOT DETECTED Final    Comment: CRITICAL RESULT CALLED TO, READ BACK BY AND VERIFIED WITH: PHARMD HAILEY VONDOLEN ON 04/01/23 @ 2017 BY DRT    Staphylococcus aureus (BCID) DETECTED (A) NOT DETECTED Final    Comment: CRITICAL RESULT CALLED TO, READ BACK BY AND VERIFIED WITH: PHARMD HAILEY VONDOLEN ON 04/01/23 @ 2017 BY DRT    Staphylococcus epidermidis NOT DETECTED NOT DETECTED Final   Staphylococcus lugdunensis NOT DETECTED NOT DETECTED Final   Streptococcus species NOT DETECTED NOT DETECTED Final   Streptococcus agalactiae NOT DETECTED NOT DETECTED Final   Streptococcus pneumoniae NOT DETECTED NOT DETECTED Final   Streptococcus pyogenes NOT DETECTED NOT DETECTED Final   A.calcoaceticus-baumannii NOT DETECTED NOT DETECTED Final   Bacteroides fragilis NOT DETECTED NOT DETECTED Final   Enterobacterales NOT DETECTED NOT DETECTED Final   Enterobacter cloacae complex NOT DETECTED NOT DETECTED Final   Escherichia coli NOT DETECTED NOT DETECTED Final   Klebsiella aerogenes NOT DETECTED NOT DETECTED Final   Klebsiella oxytoca NOT DETECTED NOT DETECTED Final   Klebsiella pneumoniae NOT DETECTED NOT DETECTED Final   Proteus species NOT DETECTED NOT DETECTED Final   Salmonella species NOT DETECTED NOT DETECTED Final   Serratia marcescens NOT DETECTED NOT DETECTED Final   Haemophilus influenzae NOT DETECTED NOT DETECTED Final   Neisseria meningitidis NOT DETECTED NOT DETECTED Final    Pseudomonas aeruginosa NOT DETECTED NOT DETECTED Final   Stenotrophomonas maltophilia NOT DETECTED NOT DETECTED Final   Candida albicans NOT DETECTED NOT DETECTED Final   Candida auris NOT DETECTED NOT DETECTED Final   Candida glabrata NOT DETECTED NOT DETECTED Final   Candida krusei NOT DETECTED NOT DETECTED Final   Candida parapsilosis NOT DETECTED NOT DETECTED Final   Candida tropicalis NOT DETECTED NOT DETECTED Final   Cryptococcus neoformans/gattii NOT DETECTED NOT DETECTED Final   Meth resistant mecA/C and MREJ NOT DETECTED  NOT DETECTED Final    Comment: Performed at [facility], [address] [place], [place] [address]  Blood Culture (routine x 2)     Status: None   Collection Time: 04/01/23  1:09 PM   Specimen: BLOOD LEFT HAND  Result Value Ref Range Status   Specimen Description BLOOD LEFT HAND  Final   Special Requests   Final    BOTTLES DRAWN AEROBIC AND ANAEROBIC Blood Culture adequate volume   Culture   Final    NO GROWTH 5 DAYS Performed at [facility], [address] [place], [place] [address]    Report Status 04/06/2023 FINAL  Final  Culture, blood (Routine X 2) w Reflex to ID Panel     Status: None   Collection Time: 04/01/23 11:15 PM   Specimen: BLOOD  Result Value Ref Range Status   Specimen Description BLOOD BLOOD LEFT ARM  Final   Special Requests   Final    BOTTLES DRAWN AEROBIC AND ANAEROBIC Blood Culture results may not be optimal due to an inadequate volume of blood received in culture bottles   Culture   Final    NO GROWTH 5 DAYS Performed at [facility], [address] [place], [place] [address]    Report Status 04/06/2023 FINAL  Final  Culture, blood (Routine X 2) w Reflex to ID Panel     Status: None   Collection Time: 04/01/23 11:21 PM   Specimen: BLOOD  Result Value Ref Range Status   Specimen Description BLOOD BLOOD LEFT HAND  Final   Special Requests   Final    BOTTLES DRAWN AEROBIC AND ANAEROBIC Blood Culture results  may not be optimal due to an inadequate volume of blood received in culture bottles   Culture   Final    NO GROWTH 5 DAYS Performed at [facility], [address] [place], [place] [address]    Report Status 04/06/2023 FINAL  Final  MRSA Next Gen by PCR, Nasal     Status: None   Collection Time: 04/02/23  5:42 AM   Specimen: Nasal Mucosa; Nasal Swab  Result Value Ref Range Status   MRSA by PCR Next Gen NOT DETECTED NOT DETECTED Final    Comment: (NOTE) The GeneXpert MRSA Assay (FDA approved for NASAL specimens only), is one component of a comprehensive MRSA colonization surveillance program. It is not intended to diagnose MRSA infection nor to guide or monitor treatment for MRSA infections. Test performance is not FDA approved in patients less than 46 years old. Performed at [facility], [address] [place], [place] [address]   Body fluid culture w Gram Stain     Status: None   Collection Time: 04/02/23  9:13 PM   Specimen: Body Fluid  Result Value Ref Range Status   Specimen Description FLUID SYNOVIAL RIGHT KNEE  Final   Special Requests NONE  Final   Gram Stain   Final    ABUNDANT WBC PRESENT, PREDOMINANTLY PMN RARE GRAM POSITIVE COCCI INTRACELLULAR Gram Stain Report Called to,Read Back By and Verified With: RN Loney Loh [phone] @ 2258 FH Performed at [facility], [address], [place], [place] [address]    Culture RARE STAPHYLOCOCCUS AUREUS  Final   Report Status 04/05/2023 FINAL  Final   Organism ID, Bacteria STAPHYLOCOCCUS AUREUS  Final      Susceptibility   Staphylococcus aureus - MIC*    CIPROFLOXACIN <=0.5 SENSITIVE Sensitive  ERYTHROMYCIN <=0.25 SENSITIVE Sensitive     GENTAMICIN <=0.5 SENSITIVE Sensitive     OXACILLIN <=0.25 SENSITIVE Sensitive     TETRACYCLINE <=1 SENSITIVE Sensitive     VANCOMYCIN 1 SENSITIVE Sensitive     TRIMETH/SULFA <=10 SENSITIVE Sensitive     CLINDAMYCIN <=0.25 SENSITIVE Sensitive     RIFAMPIN <=0.5 SENSITIVE  Sensitive     Inducible Clindamycin NEGATIVE Sensitive     LINEZOLID 2 SENSITIVE Sensitive     * RARE STAPHYLOCOCCUS AUREUS  Culture, blood (Routine X 2) w Reflex to ID Panel     Status: None (Preliminary result)   Collection Time: 04/03/23  2:43 PM   Specimen: BLOOD LEFT HAND  Result Value Ref Range Status   Specimen Description BLOOD LEFT HAND  Final   Special Requests   Final    BOTTLES DRAWN AEROBIC AND ANAEROBIC Blood Culture adequate volume   Culture   Final    NO GROWTH 4 DAYS Performed at [facility], [address] [place], [place] [address]    Report Status PENDING  Incomplete  Culture, blood (Routine X 2) w Reflex to ID Panel     Status: None (Preliminary result)   Collection Time: 04/03/23  2:47 PM   Specimen: BLOOD LEFT HAND  Result Value Ref Range Status   Specimen Description BLOOD LEFT HAND  Final   Special Requests   Final    BOTTLES DRAWN AEROBIC AND ANAEROBIC Blood Culture adequate volume   Culture   Final    NO GROWTH 4 DAYS Performed at [facility], [address], [place], [place] [address]    Report Status PENDING  Incomplete  Aerobic/Anaerobic Culture w Gram Stain (surgical/deep wound)     Status: None (Preliminary result)   Collection Time: 04/05/23 10:57 AM   Specimen: Body Fluid  Result Value Ref Range Status   Specimen Description FLUID  Final   Special Requests JOINT, LEFT SHOULDER  Final   Gram Stain   Final    MODERATE WBC PRESENT,BOTH PMN AND MONONUCLEAR NO ORGANISMS SEEN    Culture   Final    NO GROWTH 2 DAYS NO ANAEROBES ISOLATED; CULTURE IN PROGRESS FOR 5 DAYS Performed at [facility], [address], [place], [place] [address]    Report Status PENDING  Incomplete  Aerobic/Anaerobic Culture w Gram Stain (surgical/deep wound)     Status: None (Preliminary result)   Collection Time: 04/05/23 11:01 AM   Specimen: Body Fluid  Result Value Ref Range Status   Specimen Description FLUID  Final   Special Requests JOINT,  RIGHT SHOULDER  Final   Gram Stain   Final    MODERATE WBC PRESENT, PREDOMINANTLY PMN FEW GRAM POSITIVE COCCI Performed at [facility], [address] [place], [place] [address]    Culture   Final    RARE STAPHYLOCOCCUS AUREUS NO ANAEROBES ISOLATED; CULTURE IN PROGRESS FOR 5 DAYS    Report Status PENDING  Incomplete   Organism ID, Bacteria STAPHYLOCOCCUS AUREUS  Final      Susceptibility   Staphylococcus aureus - MIC*    CIPROFLOXACIN <=0.5 SENSITIVE Sensitive     ERYTHROMYCIN <=0.25 SENSITIVE Sensitive     GENTAMICIN <=0.5 SENSITIVE Sensitive     OXACILLIN <=0.25 SENSITIVE Sensitive     TETRACYCLINE <=1 SENSITIVE Sensitive     VANCOMYCIN 1 SENSITIVE Sensitive     TRIMETH/SULFA <=10 SENSITIVE Sensitive     CLINDAMYCIN <=0.25 SENSITIVE Sensitive  RIFAMPIN <=0.5 SENSITIVE Sensitive     Inducible Clindamycin NEGATIVE Sensitive     LINEZOLID 2 SENSITIVE Sensitive     * RARE STAPHYLOCOCCUS AUREUS  Aerobic/Anaerobic Culture w Gram Stain (surgical/deep wound)     Status: None (Preliminary result)   Collection Time: 04/07/23 10:20 AM   Specimen: Synovial, Left Shoulder; Body Fluid  Result Value Ref Range Status   Specimen Description SYNOVIAL  Final   Special Requests NONE  Final   Gram Stain   Final    RARE WBC SEEN NO ORGANISMS SEEN Performed at [facility], [address], [place], [place] [address]    Culture PENDING  Incomplete   Report Status PENDING  Incomplete     Serology:   Imaging: If present, new imagings (plain films, ct scans, and mri) have been personally visualized and interpreted; radiology reports have been reviewed. Decision making incorporated into the Impression / Recommendations.   7/8 cxr Left IJ central venous line with tip at the junction of the innominate vein and SVC. No pneumothorax.   04/03/23 tte  1. Left ventricular ejection fraction, by estimation, is 60 to 65%. The left ventricle has normal function. The left ventricle  has no regional wall motion abnormalities. There is severe left ventricular hypertrophy. Left ventricular diastolic parameters   are indeterminate.   2. Right ventricular systolic function is normal. The right ventricular size is normal.   3. Left atrial size was moderately dilated.   4. Mild appearing MR but some splay artifact cannot r/o some posterior leaflet prolapse in addition to MAC with anteriorly directed MR. Given bacteremia and abnormal posterior mitral annulus suggest TEE to further assess if clnically indicated. The mitral valve is degenerative. Mild mitral valve regurgitation. No evidence of mitral stenosis. Moderate mitral annular calcification.   5. The aortic valve is tricuspid. There is moderate calcification of the aortic valve. There is moderate thickening of the aortic valve. Aortic valve regurgitation is not visualized. Mild aortic valve stenosis.   6. The inferior vena cava is dilated in size with >50% respiratory variability, suggesting right atrial pressure of 8 mmHg.     7/6 mri right shoulder 1. Large glenohumeral joint effusion, nonspecific. Arthrocentesis with joint fluid analysis is recommended to exclude the possibility of septic arthritis. 2. Complete full-thickness tears of the supraspinatus, infraspinatus, and subscapularis tendons with advanced rotator cuff muscle atrophy and fatty infiltration. 3. Torn and retracted long head of the biceps tendon. 4. Moderate glenohumeral and AC joint osteoarthritis.    7/5 ct right wrist 1. Patient scanned with arms down positioning which limits detailed osseous and soft tissue assessment due to soft tissue attenuation from habitus. 2. There may be a small distal radioulnar joint effusion. Suspected small radiocarpal joint effusion. This is difficult to accurately assess on the current exam. No gross CT findings of osteomyelitis. 3. Mild edema about the ulnar aspect of the wrist and distal forearm. No soft tissue  collection.    7/4 mri left shoulder 1. Complete tear of the supraspinatus and infraspinatus tendons with 5.7 cm of retraction. 2. Moderate tendinosis of the subscapularis tendon with a small partial-thickness tear. 3. Complete tear of the proximal long head of the biceps tendon. 4. Moderate osteoarthritis of the glenohumeral joint. Large glenohumeral joint effusion.    7/4 mri cspine 1. Diffuse cervical spine spondylosis as described above. 2. No acute osseous injury of the cervical spine. 3. No aggressive osseous lesion of the cervical spine. 4. At C3-4 there is  a broad-based disc bulge with a small central disc protrusion. Moderate bilateral facet arthropathy. No severe bilateral foraminal stenosis. Severe spinal stenosis. 5. At C4-5 there is a broad-based disc bulge with a broad central disc protrusion. Severe spinal stenosis. Severe bilateral foraminal stenosis. 6. At C5-6 there is a mild broad-based disc bulge. Moderate right facet arthropathy. Severe bilateral foraminal stenosis. Mild spinal stenosis. 7. At C6-7 there is a mild broad-based disc bulge. Mild bilateral facet arthropathy. Severe right foraminal stenosis. Moderate left foraminal stenosis.   7/02 xray right knee Tricompartment degenerative changes identified, overall moderate greatest involving the medial compartment and patellofemoral joint.   Moderate joint effusion of uncertain etiology. This has a broad differential. This could be related to the degenerative changes. However please correlate for history of trauma or infection.   7/02 xray left knee Mild degenerative changes. No joint effusion.     Raymondo Band, MD [facility] [phone] pager    04/07/2023, 4:34 PM

## 2023-04-08 ENCOUNTER — Encounter (HOSPITAL_COMMUNITY): Payer: Self-pay | Admitting: Orthopedic Surgery

## 2023-04-08 ENCOUNTER — Encounter (HOSPITAL_COMMUNITY): Admission: EM | Disposition: E | Payer: Self-pay | Source: Home / Self Care | Attending: Pulmonary Disease

## 2023-04-08 DIAGNOSIS — R7881 Bacteremia: Secondary | ICD-10-CM | POA: Diagnosis not present

## 2023-04-08 DIAGNOSIS — B9561 Methicillin susceptible Staphylococcus aureus infection as the cause of diseases classified elsewhere: Secondary | ICD-10-CM | POA: Diagnosis not present

## 2023-04-08 LAB — CBC WITH DIFFERENTIAL/PLATELET
Abs Immature Granulocytes: 0 10*3/uL (ref 0.00–0.07)
Basophils Absolute: 0 10*3/uL (ref 0.0–0.1)
Basophils Relative: 0 %
Eosinophils Absolute: 0.6 10*3/uL — ABNORMAL HIGH (ref 0.0–0.5)
Eosinophils Relative: 3 %
HCT: 28.7 % — ABNORMAL LOW (ref 39.0–52.0)
Hemoglobin: 9.9 g/dL — ABNORMAL LOW (ref 13.0–17.0)
Lymphocytes Relative: 7 %
Lymphs Abs: 1.3 10*3/uL (ref 0.7–4.0)
MCH: 33.3 pg (ref 26.0–34.0)
MCHC: 34.5 g/dL (ref 30.0–36.0)
MCV: 96.6 fL (ref 80.0–100.0)
Monocytes Absolute: 1 10*3/uL (ref 0.1–1.0)
Monocytes Relative: 5 %
Neutro Abs: 16.2 10*3/uL — ABNORMAL HIGH (ref 1.7–7.7)
Neutrophils Relative %: 85 %
Platelets: 209 10*3/uL (ref 150–400)
RBC: 2.97 MIL/uL — ABNORMAL LOW (ref 4.22–5.81)
RDW: 15.7 % — ABNORMAL HIGH (ref 11.5–15.5)
WBC: 19 10*3/uL — ABNORMAL HIGH (ref 4.0–10.5)
nRBC: 0 /100 WBC
nRBC: 0.2 % (ref 0.0–0.2)

## 2023-04-08 LAB — GLUCOSE, CAPILLARY
Glucose-Capillary: 105 mg/dL — ABNORMAL HIGH (ref 70–99)
Glucose-Capillary: 85 mg/dL (ref 70–99)
Glucose-Capillary: 91 mg/dL (ref 70–99)
Glucose-Capillary: 94 mg/dL (ref 70–99)

## 2023-04-08 LAB — BASIC METABOLIC PANEL
Anion gap: 12 (ref 5–15)
BUN: 40 mg/dL — ABNORMAL HIGH (ref 8–23)
CO2: 25 mmol/L (ref 22–32)
Calcium: 6.5 mg/dL — ABNORMAL LOW (ref 8.9–10.3)
Chloride: 94 mmol/L — ABNORMAL LOW (ref 98–111)
Creatinine, Ser: 4.21 mg/dL — ABNORMAL HIGH (ref 0.61–1.24)
GFR, Estimated: 14 mL/min — ABNORMAL LOW (ref >60)
Glucose, Bld: 94 mg/dL (ref 70–99)
Potassium: 3.4 mmol/L — ABNORMAL LOW (ref 3.5–5.1)
Sodium: 131 mmol/L — ABNORMAL LOW (ref 135–145)

## 2023-04-08 LAB — HEPATIC FUNCTION PANEL
ALT: 19 U/L (ref 0–44)
AST: 57 U/L — ABNORMAL HIGH (ref 15–41)
Albumin: <1.5 g/dL — ABNORMAL LOW (ref 3.5–5.0)
Alkaline Phosphatase: 67 U/L (ref 38–126)
Bilirubin, Direct: 4.3 mg/dL — ABNORMAL HIGH (ref 0.0–0.2)
Indirect Bilirubin: 8.6 mg/dL — ABNORMAL HIGH (ref 0.3–0.9)
Total Bilirubin: 12.9 mg/dL — ABNORMAL HIGH (ref 0.3–1.2)
Total Protein: 6.5 g/dL (ref 6.5–8.1)

## 2023-04-08 LAB — POCT I-STAT EG7
Acid-Base Excess: 1 mmol/L (ref 0.0–2.0)
Bicarbonate: 25.1 mmol/L (ref 20.0–28.0)
Calcium, Ion: 0.84 mmol/L — CL (ref 1.15–1.40)
HCT: 30 % — ABNORMAL LOW (ref 39.0–52.0)
Hemoglobin: 10.2 g/dL — ABNORMAL LOW (ref 13.0–17.0)
O2 Saturation: 68 %
Patient temperature: 98
Potassium: 4 mmol/L (ref 3.5–5.1)
Sodium: 130 mmol/L — ABNORMAL LOW (ref 135–145)
TCO2: 26 mmol/L (ref 22–32)
pCO2, Ven: 38.3 mmHg — ABNORMAL LOW (ref 44–60)
pH, Ven: 7.424 (ref 7.25–7.43)
pO2, Ven: 34 mmHg (ref 32–45)

## 2023-04-08 LAB — APTT: aPTT: 48 seconds — ABNORMAL HIGH (ref 24–36)

## 2023-04-08 LAB — MAGNESIUM: Magnesium: 1.8 mg/dL (ref 1.7–2.4)

## 2023-04-08 LAB — HEPARIN LEVEL (UNFRACTIONATED): Heparin Unfractionated: <0.1 IU/mL — ABNORMAL LOW (ref 0.30–0.70)

## 2023-04-08 LAB — CULTURE, BLOOD (ROUTINE X 2)
Special Requests: ADEQUATE
Special Requests: ADEQUATE

## 2023-04-08 LAB — TROPONIN I (HIGH SENSITIVITY)
Troponin I (High Sensitivity): 127 ng/L (ref <18)
Troponin I (High Sensitivity): 121 ng/L (ref <18)

## 2023-04-08 LAB — AEROBIC/ANAEROBIC CULTURE W GRAM STAIN (SURGICAL/DEEP WOUND)

## 2023-04-08 LAB — PROCALCITONIN: Procalcitonin: 10.12 ng/mL

## 2023-04-08 LAB — C-REACTIVE PROTEIN: CRP: 29.2 mg/dL — ABNORMAL HIGH (ref <1.0)

## 2023-04-08 SURGERY — ECHOCARDIOGRAM, TRANSESOPHAGEAL
Anesthesia: Monitor Anesthesia Care

## 2023-04-08 MED ORDER — APIXABAN 5 MG PO TABS
5.0000 mg | ORAL_TABLET | Freq: Two times a day (BID) | ORAL | Status: DC
  Administered 2023-04-08 – 2023-04-09 (×2): 5 mg via ORAL
  Filled 2023-04-08 (×2): qty 1

## 2023-04-08 MED ORDER — HEPARIN SODIUM (PORCINE) 1000 UNIT/ML DIALYSIS
10000.0000 [IU] | INTRAMUSCULAR | Status: DC | PRN
  Administered 2023-04-08: 10000 [IU] via INTRAVENOUS_CENTRAL
  Filled 2023-04-08 (×3): qty 10

## 2023-04-08 MED ORDER — ANTICOAGULANT SODIUM CITRATE 4% (200MG/5ML) IV SOLN: 5.0000 mL | Status: DC | PRN

## 2023-04-08 MED ORDER — POTASSIUM CHLORIDE 20 MEQ PO PACK
20.0000 meq | PACK | Freq: Once | ORAL | Status: AC
  Administered 2023-04-08: 20 meq via ORAL
  Filled 2023-04-08: qty 1

## 2023-04-08 MED ORDER — ALTEPLASE 2 MG IJ SOLR: 2.0000 mg | Freq: Once | INTRAMUSCULAR | Status: DC | PRN

## 2023-04-08 MED ORDER — LIDOCAINE-PRILOCAINE 2.5-2.5 % EX CREA: 1.0000 | TOPICAL_CREAM | CUTANEOUS | Status: DC | PRN

## 2023-04-08 MED ORDER — PENTAFLUOROPROP-TETRAFLUOROETH EX AERO: 1.0000 | INHALATION_SPRAY | CUTANEOUS | Status: DC | PRN

## 2023-04-08 MED ORDER — MAGNESIUM SULFATE 2 GM/50ML IV SOLN
2.0000 g | Freq: Once | INTRAVENOUS | Status: AC
  Administered 2023-04-08: 2 g via INTRAVENOUS
  Filled 2023-04-08: qty 50

## 2023-04-08 MED ORDER — HEPARIN SODIUM (PORCINE) 1000 UNIT/ML DIALYSIS
1000.0000 [IU] | INTRAMUSCULAR | Status: DC | PRN
  Filled 2023-04-08: qty 1

## 2023-04-08 MED ORDER — SODIUM CHLORIDE 0.9 % IV SOLN: INTRAVENOUS | Status: DC | PRN

## 2023-04-08 MED ORDER — CHLORHEXIDINE GLUCONATE CLOTH 2 % EX PADS
6.0000 | MEDICATED_PAD | Freq: Every day | CUTANEOUS | Status: DC
  Administered 2023-04-08 – 2023-04-12 (×5): 6 via TOPICAL

## 2023-04-08 MED ORDER — LACTATED RINGERS IV BOLUS
500.0000 mL | Freq: Once | INTRAVENOUS | Status: AC
  Administered 2023-04-08: 500 mL via INTRAVENOUS

## 2023-04-08 NOTE — Progress Notes (Signed)
Progress Note  Patient Name: Peter [name] Date of Encounter: 04/08/2023  Primary Cardiologist:   Rollene Rotunda, MD   Subjective   No chest pain.  No SOB.  Somnolent.    Inpatient Medications    Scheduled Meds:  (feeding supplement) PROSource Plus  30 mL Oral BID BM   atorvastatin  10 mg Oral QHS   bupivacaine(PF)  10 mL Infiltration Once   Chlorhexidine Gluconate Cloth  6 each Topical Q0600   cinacalcet  180 mg Oral Q breakfast   ferric citrate  630 mg Oral TID WC   metoprolol tartrate  12.5 mg Oral BID   midodrine  10 mg Oral TID WC   multivitamin  1 tablet Oral QHS   pantoprazole (PROTONIX) IV  40 mg Intravenous Q12H   tranexamic acid (CYKLOKAPRON) 2,000 mg in sodium chloride 0.9 % 50 mL Topical Application  2,000 mg Topical Once   Continuous Infusions:  anticoagulant sodium citrate     nafcillin IV Stopped (04/08/23 0504)   norepinephrine (LEVOPHED) Adult infusion 3 mcg/min (04/08/23 0700)   PRN Meds: acetaminophen, alteplase, anticoagulant sodium citrate, diltiazem, heparin, heparin, lidocaine (PF), lidocaine-prilocaine, mouth rinse, oxyCODONE, pentafluoroprop-tetrafluoroeth   Vital Signs    Vitals:   04/08/23 0745 04/08/23 0800 04/08/23 0801 04/08/23 0815  BP: 92/63 (!) 89/52 (!) 89/52 (!) 99/43  Pulse: 90 (!) 104  89  Resp: 15 17  16   Temp:   97.8 F (36.6 C)   TempSrc:   Oral   SpO2: 97% 97%  97%  Weight:      Height:        Intake/Output Summary (Last 24 hours) at 04/08/2023 0833 Last data filed at 04/08/2023 0700 Gross per 24 hour  Intake 1497.51 ml  Output 10 ml  Net 1487.51 ml   Filed Weights   04/04/23 1506 04/07/23 0530 04/08/23 0535  Weight: 97.4 kg 98.9 kg 103.1 kg    Telemetry    Atrial fib with PVCs - Personally Reviewed  ECG    NA - Personally Reviewed  Physical Exam   GEN:    Acutely ill appearing.   Neck: No  JVD Cardiac: Irregular RR, apical systolic murmur, no diastolic murmurs, rubs, or gallops.  Respiratory:      Decreased breath sounds GI: Soft, nontender, non-distended, normal bowel sounds  MS:  Diffuse edema; No deformity. Neuro:   Nonfocal  Psych: Oriented and appropriate   Labs    Chemistry Recent Labs  Lab 04/06/23 0815 04/07/23 0701 04/07/23 1056 04/07/23 1453 04/08/23 0439  NA 129* 128* 123* 130*  --   K 5.3* 5.1 5.5* 5.5*  --   CL 90* 91* 93* 90*  --   CO2 21* 21*  --  20*  --   GLUCOSE 64* 115* 79 79  --   BUN 77* 96* 85* 98*  --   CREATININE 7.17* 8.02* 7.70* 8.13*  --   CALCIUM 7.3* 6.2*  --  6.3*  --   PROT 6.6 6.1*  --  6.4* 6.5  ALBUMIN <1.5* <1.5*  --  <1.5* <1.5*  AST 89* 64*  --  65* 57*  ALT 40 29  --  25 19  ALKPHOS 80 68  --  65 67  BILITOT 9.8* 10.3*  --  11.8* 12.9*  GFRNONAA 7* 6*  --  6*  --   ANIONGAP 18* 16*  --  20*  --      Hematology Recent Labs  Lab 04/07/23 1453  04/07/23 2054 04/08/23 0439  WBC 19.5* 18.9* 19.0*  RBC 3.27* 2.93* 2.97*  HGB 10.6* 9.5* 9.9*  HCT 31.2* 28.2* 28.7*  MCV 95.4 96.2 96.6  MCH 32.4 32.4 33.3  MCHC 34.0 33.7 34.5  RDW 15.4 15.5 15.7*  PLT 204 196 209    Cardiac EnzymesNo results for input(s): "TROPONINI" in the last 168 hours. No results for input(s): "TROPIPOC" in the last 168 hours.   BNP Recent Labs  Lab 04/04/23 0441 04/05/23 0315 04/06/23 0511  BNP 112.2* 142.3* 183.4*     DDimer No results for input(s): "DDIMER" in the last 168 hours.   Radiology    DG CHEST PORT 1 VIEW  Result Date: 04/07/2023 CLINICAL DATA:  Central line placement. EXAM: PORTABLE CHEST 1 VIEW COMPARISON:  Chest radiograph dated 04/02/2023. FINDINGS: Left IJ central venous line with tip at the junction of the innominate vein and SVC. Shallow inspiration with bibasilar atelectasis. No focal consolidation, pleural effusion or pneumothorax. Stable cardiomegaly. Atherosclerotic calcification of the aorta. No acute osseous pathology. Degenerative changes of the spine and shoulders. IMPRESSION: Left IJ central venous line with tip at  the junction of the innominate vein and SVC. No pneumothorax. Electronically Signed   By: Elgie Collard M.D.   On: 04/07/2023 15:29    Cardiac Studies   Echo:  1. Left ventricular ejection fraction, by estimation, is 60 to 65%. The  left ventricle has normal function. The left ventricle has no regional  wall motion abnormalities. There is severe left ventricular hypertrophy.  Left ventricular diastolic parameters   are indeterminate.   2. Right ventricular systolic function is normal. The right ventricular  size is normal.   3. Left atrial size was moderately dilated.   4. Mild appearing MR but some splay artifact cannot r/o some posterior  leaflet prolapse in addition to MAC with anteriorly directed MR Given  bacteremia and abnormal posterior mitral annulus suggest TEE to further  assess if clnically indicated. The  mitral valve is degenerative. Mild mitral valve regurgitation. No evidence  of mitral stenosis. Moderate mitral annular calcification.   5. The aortic valve is tricuspid. There is moderate calcification of the  aortic valve. There is moderate thickening of the aortic valve. Aortic  valve regurgitation is not visualized. Mild aortic valve stenosis.   6. The inferior vena cava is dilated in size with >50% respiratory  variability, suggesting right atrial pressure of 8 mmHg.   Patient Profile     72 y.o. male with a hx of ESRD on HD MWF, type 2 dM, anemia of CKD, HTN, HLD, Mild MR who is being seen 04/04/2023 for the evaluation of atrial fibrillation, possible endocarditis at the request of Dr. Thedore Mins    Assessment & Plan    MSSA bacteremia/septic arthritis:  Possible endocarditis.  TEE on hold as ID does not think it will change the plan.  However, he will need TEE before discharge.  He is not a surgical candidate at this point but long term plans would likely include mitral valve repair or replacement pending his recovering from this critical illness and his wishes.      Atrial fib:    Heparin on hold with ongoing bleeding.  Resume anticoagulation when OK with surgery.  Rate is controlled given the situation.   MR:  As above.    For questions or updates, please contact [facility] Please consult [email or web address] for contact info under Cardiology/STEMI.   Signed, Rollene Rotunda, MD  04/08/2023, 8:33 AM

## 2023-04-08 NOTE — Progress Notes (Signed)
   04/08/23 1040  Vitals  Temp (!) 97.4 F (36.3 C)  Temp Source Axillary  BP (!) 107/54  BP Location Left Arm  BP Method Automatic  Patient Position (if appropriate) Lying  Oxygen Therapy  SpO2 99 %  O2 Device Room Air  During Treatment Monitoring  Intra-Hemodialysis Comments Tx completed  Post Treatment  Dialyzer Clearance Lightly streaked  Duration of HD Treatment -hour(s) 4 hour(s)  Hemodialysis Intake (mL) 0 mL  Liters Processed 96  Fluid Removed (mL) 1500 mL  Tolerated HD Treatment Yes  Post-Hemodialysis Comments Pt goal met.  AVG/AVF Arterial Site Held (minutes) 10 minutes  AVG/AVF Venous Site Held (minutes) 10 minutes  Fistula / Graft Right Upper arm Arteriovenous vein graft  Placement Date/Time: 01/16/23 1122   Placed prior to admission: No  Orientation: Right  Access Location: Upper arm  Access Type: Arteriovenous vein graft  Expiration Date: 07/15/27  Site Condition Painful  Fistula / Graft Assessment Present;Thrill;Bruit  Status Deaccessed  Needle Size 15  Drainage Description None

## 2023-04-08 NOTE — Progress Notes (Addendum)
eLink Physician-Brief Progress Note Patient Name: Cartrell Filardi DOB: 03/19/1951 MRN: [id number]   Date of Service  04/08/2023  HPI/Events of Note  72 year old male with a history of end-stage renal disease on intermittent hemodialysis, paroxysmal atrial fibrillation on anticoagulation and left glenohumeral joint infection status post operative debridement today who returns from the operating room with softer blood pressures. Has known MSSA bacteremia on appropriate antibiotics nafcillin/cefazolin.   Critical Trop of 127  Edematous upper and lower extremities with inaccurate blood pressure readings.  Status post LR bolus this morning, ESRD patient.  Currently on midodrine and levo 6.  eICU Interventions  Unclear why troponin was ordered, but will trend till nadir.   Maintain euvolemia, he is net -400 cc today thus far.  He has +922 cc overall.  Add aline    0330 -now up to 22 mcg of norepinephrine with MAP of 61.  Add on vasopressin and 25 g of 25% albumin.  0501 - His calcium has been low despite resuscitation.  And his calcium 0.8, corrected calcium unable to assess due to hypoalbuminemia.  Administer 4 g calcium now.  Improved nor epi requirements with vaso and albumin.  Intervention Category Minor Interventions: Routine modifications to care plan (e.g. PRN medications for pain, fever)  Rosie Torrez 04/08/2023, 9:15 PM

## 2023-04-08 NOTE — Progress Notes (Signed)
[facility] Progress Note   Subjective: seen in ICU, had HD early this am and was finishing up when I saw him.  Per HD RN arterial pressures are low, may need fistulogram.   Objective Vitals:   04/07/23 1415 04/07/23 1430 04/07/23 1445 04/07/23 1500  BP: (!) 105/55 (!) 107/50 114/71 (!) 92/51  Pulse: 77 84 93 72  Resp: 20 19 18 18   Temp:      TempSrc:      SpO2: 94% 95% (!) 88% 97%  Weight:      Height:       Physical Exam General: Ill appearing man, post op is drowsy Heart:Irregularly irregular Lungs: CTA anteriorly, poor effort Abdomen: soft Extremities: 1+ BLE edema; 1+ RUE edema Dialysis Access: R AVG + bruit   Home meds - eliquis, lipitor, auryxia 3 ac tid, prns   Dialysis Orders:  MWF Peter [name] 4hr  450/800     97.5kg    3K/2Ca   AVG   15g   Heparin 10,000 bolus - no ESA, Hgb > 12 - No VDRA (recently stopped d/t Ca 10.9)   Assessment/ Plan Sepsis/ aspiration pneumonia / septic joints: Currently on antibiotics, requiring oxygenation. Per ID/ primary team. MSSA bacteremia: 1st set Blood Cx 7/2 positive w/ aspirate from knee also + for staph aureus.  AVG without signs of infection. ID following, remains on nafcillin. Plan for TEE.  Hypotension - has been requiring levo gtt at low doses 1-5 mcg/min, also on midodrine 10 tid po.  Shoulder/Knee effusion - SP washout/ I&D of bilat shoulder and R knee 7/08 per ortho 7/08.  ESRD: HD is MWF. Had HD overnight last night. Next HD tomorrow.  HD access: high BUN/ K+ and low arterial pressures. Will ask IR for fistulogram when he is deemed stable enough.  Anemia of CKD: Hemoglobin above goal.  No need for ESA. CKD-MBD: Calcium low, sensipardc'd for now. Got 2gm IV Ca. Phos high, will cont binders and try to get his AVF checked for stenosis. Volume: 5kg up by wts, +LE / UE edema, soft bp's on pressors due to sepsis. Will get what we can w/ HD tomorrow.  Elevated LFTs - tbili up to 12, AST/ALT okay.  A-Fib  Peter Moselle  MD  [facility] 04/08/2023, 10:30 AM  Recent Labs  Lab 04/02/23 0555 04/03/23 0140 04/07/23 0701 04/07/23 1056 04/07/23 1453 04/07/23 2054 04/08/23 0439  HGB  --    < > 10.8* 11.6* 10.6* 9.5* 9.9*  ALBUMIN 2.3*   < > <1.5*  --  <1.5*  --  <1.5*  CALCIUM 9.1   < > 6.2*  --  6.3*  --   --   PHOS 4.7*  --   --   --  11.3*  --   --   CREATININE 7.96*   < > 8.02* 7.70* 8.13*  --   --   K 5.2*   < > 5.1 5.5* 5.5*  --   --    < > = values in this interval not displayed.     Inpatient medications:  (feeding supplement) PROSource Plus  30 mL Oral BID BM   atorvastatin  10 mg Oral QHS   bupivacaine(PF)  10 mL Infiltration Once   Chlorhexidine Gluconate Cloth  6 each Topical Q0600   cinacalcet  180 mg Oral Q breakfast   ferric citrate  630 mg Oral TID WC   metoprolol tartrate  12.5 mg Oral BID   midodrine  10  mg Oral TID WC   multivitamin  1 tablet Oral QHS   pantoprazole (PROTONIX) IV  40 mg Intravenous Q12H    anticoagulant sodium citrate     nafcillin IV 2 g (04/08/23 0849)   norepinephrine (LEVOPHED) Adult infusion 3 mcg/min (04/08/23 0700)   acetaminophen, alteplase, anticoagulant sodium citrate, diltiazem, heparin, heparin, lidocaine (PF), lidocaine-prilocaine, mouth rinse, oxyCODONE, pentafluoroprop-tetrafluoroeth

## 2023-04-08 NOTE — Progress Notes (Signed)
Physical Therapy Treatment Patient Details Name: Peter [name] MRN: [id number] DOB: 09-09-1951 Today's Date: 04/08/2023   History of Present Illness Pt is a 72 y.o. male admitted 04/01/23 after presenting to ED with generalized weakness, nausea, vomiting and intermittent fevers and chills since Sunday morning. Pt subsequently found have aspiration pneumonia and found to be septic with blood cultures positive for Staph aureus, BCID with MSSA. Pt further reported falling on Sunday evening, unable to get up and on the floor for a few hours, bruising and swelling noted around Left eye/face. PMH significant of ESRD on HD MWF, Type 2 DM, anemia of CKD, hypertension, paroxysmal atrial fibrillation, and hyperlipidemia.    PT Comments  Patient progressing slowly.  Limited still by pain in joints and by limited sitting tolerance.  Patient not stable for sit to stand today due to pain and poor upright tolerance.  He was able to mobilize both knees and shoulders though still quite tender R wrist.  Feel he will continue to benefit from skilled PT in the acute setting and from follow up inpatient rehab >3 hours/day prior to d/c home.      Assistance Recommended at Discharge Frequent or constant Supervision/Assistance  If plan is discharge home, recommend the following:  Can travel by private vehicle    Two people to help with walking and/or transfers;A lot of help with bathing/dressing/bathroom;Assistance with cooking/housework;Assist for transportation;Help with stairs or ramp for entrance;Direct supervision/assist for medications management   No  Equipment Recommendations  None recommended by PT    Recommendations for Other Services       Precautions / Restrictions Precautions Precautions: Fall Precaution Comments: watch O2 and BP; R knee and R wrist pain     Mobility  Bed Mobility Overal bed mobility: Needs Assistance Bed Mobility: Supine to Sit, Sit to Sidelying Rolling: Total assist, +2  for physical assistance, +2 for safety/equipment       Sit to sidelying: Max assist, +2 for physical assistance General bed mobility comments: helicopter technique with HOB up for supine to sit assisting with legs off EOB and to lift trunk; reposition hips, to sidelying with mod cues and A for legs and to reposition trunk    Transfers                   General transfer comment: not able to attempt    Ambulation/Gait                   Stairs             Wheelchair Mobility     Tilt Bed    Modified Rankin (Stroke Patients Only)       Balance Overall balance assessment: Needs assistance   Sitting balance-Leahy Scale: Poor Sitting balance - Comments: min A for balance then pt requesting to lean back so PT sitting behind to support at EOB and pt still requesting back to supine, BP stable in sitting and pt performing seated therex briefly prior to return to supine                                    Cognition Arousal/Alertness: Lethargic Behavior During Therapy: Flat affect Overall Cognitive Status: Impaired/Different from baseline Area of Impairment: Memory, Following commands, Safety/judgement, Awareness, Problem solving, Attention                   Current Attention Level: Focused  Memory: Decreased short-term memory Following Commands: Follows one step commands with increased time, Follows one step commands inconsistently Safety/Judgement: Decreased awareness of safety, Decreased awareness of deficits   Problem Solving: Slow processing, Requires verbal cues, Requires tactile cues General Comments: returns to lethargic state after roused initially, needs consistent cues to keep alert; pain focused        Exercises Other Exercises Other Exercises: seated LAQ x 5 each leg AROM Other Exercises: seated reaching rows x 5 each arm AAROM    General Comments General comments (skin integrity, edema, etc.): noted +BM so RN  assisted to roll to perform hygiene and replace linen.      Pertinent Vitals/Pain Pain Assessment Pain Assessment: Faces Faces Pain Scale: Hurts whole lot Pain Location: generalized, most at R wrist, R knee Pain Descriptors / Indicators: Aching, Discomfort, Grimacing, Guarding Pain Intervention(s): Monitored during session, Repositioned, RN gave pain meds during session, Limited activity within patient's tolerance    Home Living                          Prior Function            PT Goals (current goals can now be found in the care plan section) Progress towards PT goals: Progressing toward goals    Frequency    Min 3X/week      PT Plan Current plan remains appropriate    Co-evaluation              AM-PAC PT "6 Clicks" Mobility   Outcome Measure  Help needed turning from your back to your side while in a flat bed without using bedrails?: Total Help needed moving from lying on your back to sitting on the side of a flat bed without using bedrails?: Total Help needed moving to and from a bed to a chair (including a wheelchair)?: Total Help needed standing up from a chair using your arms (e.g., wheelchair or bedside chair)?: Total Help needed to walk in hospital room?: Total Help needed climbing 3-5 steps with a railing? : Total 6 Click Score: 6    End of Session Equipment Utilized During Treatment: Oxygen Activity Tolerance: Patient limited by pain Patient left: in bed;with call bell/phone within reach   PT Visit Diagnosis: Muscle weakness (generalized) (M62.81);History of falling (Z91.81);Other abnormalities of gait and mobility (R26.89);Pain Pain - Right/Left: Right Pain - part of body: Knee     Time: 1235-1311 PT Time Calculation (min) (ACUTE ONLY): 36 min  Charges:    $Therapeutic Activity: 23-37 mins PT General Charges $$ ACUTE PT VISIT: 1 Visit                     Sheran Lawless, PT Acute Rehabilitation  Services Office:[phone] 04/08/2023    Peter [name] 04/08/2023, 3:13 PM

## 2023-04-08 NOTE — Progress Notes (Signed)
Orthopaedic Trauma Service Progress Note  Patient ID: Abb Gobert MRN: [id number] DOB/AGE: [birth year] 72 y.o.  Subjective:  Ortho issues stable C/o B shoulder pain and R knee pain but much improved since pre-op  No further drainage of R shoulder portal   ROS As above  Objective:   VITALS:   Vitals:   04/08/23 1334 04/08/23 1345 04/08/23 1400 04/08/23 1500  BP:  101/63 (!) 71/62 (!) 94/49  Pulse: 93 85 (!) 102 95  Resp: 20 19 18  (!) 29  Temp:      TempSrc:      SpO2: 98% 97% 95% 96%  Weight:      Height:        Estimated body mass index is 36.69 kg/m as calculated from the following:   Height as of this encounter: 5\' 6"  (1.676 m).   Weight as of this encounter: 103.1 kg.   Intake/Output      07/08 0701 07/09 0700 07/09 0701 07/10 0700   P.O.     I.V. (mL/kg) 607.4 (5.9) 90.7 (0.9)   IV Piggyback 890.2 108   Total Intake(mL/kg) 1497.5 (14.5) 198.7 (1.9)   Other  1500   Blood 10    Total Output 10 1500   Net +1487.5 -1301.3        Stool Occurrence 2 x      LABS  Results for orders placed or performed during the hospital encounter of 04/01/23 (from the past 24 hour(s))  Glucose, capillary     Status: None   Collection Time: 04/07/23  7:00 PM  Result Value Ref Range   Glucose-Capillary 99 70 - 99 mg/dL  CBC     Status: Abnormal   Collection Time: 04/07/23  8:54 PM  Result Value Ref Range   WBC 18.9 (H) 4.0 - 10.5 K/uL   RBC 2.93 (L) 4.22 - 5.81 MIL/uL   Hemoglobin 9.5 (L) 13.0 - 17.0 g/dL   HCT 40.9 (L) 81.1 - 91.4 %   MCV 96.2 80.0 - 100.0 fL   MCH 32.4 26.0 - 34.0 pg   MCHC 33.7 30.0 - 36.0 g/dL   RDW 78.2 95.6 - 21.3 %   Platelets 196 150 - 400 K/uL   nRBC 0.2 0.0 - 0.2 %  Glucose, capillary     Status: Abnormal   Collection Time: 04/07/23 11:54 PM  Result Value Ref Range   Glucose-Capillary 114 (H) 70 - 99 mg/dL  Procalcitonin     Status: None   Collection Time:  04/08/23  4:39 AM  Result Value Ref Range   Procalcitonin 10.12 ng/mL  CBC with Differential/Platelet     Status: Abnormal   Collection Time: 04/08/23  4:39 AM  Result Value Ref Range   WBC 19.0 (H) 4.0 - 10.5 K/uL   RBC 2.97 (L) 4.22 - 5.81 MIL/uL   Hemoglobin 9.9 (L) 13.0 - 17.0 g/dL   HCT 08.6 (L) 57.8 - 46.9 %   MCV 96.6 80.0 - 100.0 fL   MCH 33.3 26.0 - 34.0 pg   MCHC 34.5 30.0 - 36.0 g/dL   RDW 62.9 (H) 52.8 - 41.3 %   Platelets 209 150 - 400 K/uL   nRBC 0.2 0.0 - 0.2 %   Neutrophils Relative % 85 %   Neutro Abs 16.2 (H) 1.7 - 7.7  K/uL   Lymphocytes Relative 7 %   Lymphs Abs 1.3 0.7 - 4.0 K/uL   Monocytes Relative 5 %   Monocytes Absolute 1.0 0.1 - 1.0 K/uL   Eosinophils Relative 3 %   Eosinophils Absolute 0.6 (H) 0.0 - 0.5 K/uL   Basophils Relative 0 %   Basophils Absolute 0.0 0.0 - 0.1 K/uL   nRBC 0 0 /100 WBC   Abs Immature Granulocytes 0.00 0.00 - 0.07 K/uL  C-reactive protein     Status: Abnormal   Collection Time: 04/08/23  4:39 AM  Result Value Ref Range   CRP 29.2 (H) <1.0 mg/dL  Hepatic function panel     Status: Abnormal   Collection Time: 04/08/23  4:39 AM  Result Value Ref Range   Total Protein 6.5 6.5 - 8.1 g/dL   Albumin <1.6 (L) 3.5 - 5.0 g/dL   AST 57 (H) 15 - 41 U/L   ALT 19 0 - 44 U/L   Alkaline Phosphatase 67 38 - 126 U/L   Total Bilirubin 12.9 (H) 0.3 - 1.2 mg/dL   Bilirubin, Direct 4.3 (H) 0.0 - 0.2 mg/dL   Indirect Bilirubin 8.6 (H) 0.3 - 0.9 mg/dL  APTT     Status: Abnormal   Collection Time: 04/08/23  4:39 AM  Result Value Ref Range   aPTT 48 (H) 24 - 36 seconds  Heparin level (unfractionated)     Status: Abnormal   Collection Time: 04/08/23  4:39 AM  Result Value Ref Range   Heparin Unfractionated <0.10 (L) 0.30 - 0.70 IU/mL  Glucose, capillary     Status: None   Collection Time: 04/08/23  6:28 AM  Result Value Ref Range   Glucose-Capillary 85 70 - 99 mg/dL  Basic metabolic panel     Status: Abnormal   Collection Time: 04/08/23  12:12 PM  Result Value Ref Range   Sodium 131 (L) 135 - 145 mmol/L   Potassium 3.4 (L) 3.5 - 5.1 mmol/L   Chloride 94 (L) 98 - 111 mmol/L   CO2 25 22 - 32 mmol/L   Glucose, Bld 94 70 - 99 mg/dL   BUN 40 (H) 8 - 23 mg/dL   Creatinine, Ser 1.09 (H) 0.61 - 1.24 mg/dL   Calcium 6.5 (L) 8.9 - 10.3 mg/dL   GFR, Estimated 14 (L) >60 mL/min   Anion gap 12 5 - 15  Magnesium     Status: None   Collection Time: 04/08/23 12:12 PM  Result Value Ref Range   Magnesium 1.8 1.7 - 2.4 mg/dL  Glucose, capillary     Status: None   Collection Time: 04/08/23  1:31 PM  Result Value Ref Range   Glucose-Capillary 91 70 - 99 mg/dL     PHYSICAL EXAM:   Gen: awake, alert, looks slightly better than he has  Lungs: unlabored Ext:       Left Upper extremity              dressings L shoulder stable   Posterior dressing has fallen off but no drainaage  No other concerning findings noted                    Right lower extremity              R knee portal dressings changed             No erythema             No real tenderness with  firm palpation around knee              tolerates PROM   Exam stable              Ext warm         Right upper extremity              posterior shoulder dressing is clean and dry   Anterior shoulder dressing stable  No concerning findings   Assessment/Plan: 1 Day Post-Op   Principal Problem:   MSSA bacteremia Active Problems:   Obstructive sleep apnea   Essential hypertension, benign   MVP (mitral valve prolapse)   ESRD on dialysis (HCC)   Anemia in chronic kidney disease   PAF (paroxysmal atrial fibrillation) (HCC)   Aspiration pneumonia (HCC)   Staphylococcal arthritis of right knee (HCC)   Staphylococcal arthritis of right wrist (HCC)   Staphylococcal arthritis of left shoulder (HCC)   Staphylococcal arthritis of right shoulder (HCC)   Sepsis (HCC)   Anti-infectives (From admission, onward)    Start     Dose/Rate Route Frequency Ordered Stop   04/06/23  1200  tobramycin 80mg /80mL NS (2 mg/mL) for wound/joint injection        80 mg Intra-articular  Once 04/06/23 1108 04/06/23 1200   04/03/23 1200  nafcillin 2 g in sodium chloride 0.9 % 100 mL IVPB        2 g 216 mL/hr over 30 Minutes Intravenous Every 4 hours 04/03/23 1112     04/03/23 1145  nafcillin injection 2 g  Status:  Discontinued        2 g Intravenous Every 4 hours 04/03/23 1050 04/03/23 1112   04/02/23 1245  Ampicillin-Sulbactam (UNASYN) 3 g in sodium chloride 0.9 % 100 mL IVPB  Status:  Discontinued        3 g 200 mL/hr over 30 Minutes Intravenous Daily 04/02/23 1152 04/03/23 1050   04/01/23 2200  ceFAZolin (ANCEF) IVPB 1 g/50 mL premix  Status:  Discontinued        1 g 100 mL/hr over 30 Minutes Intravenous Every 24 hours 04/01/23 2039 04/02/23 1146   04/01/23 0830  cefTRIAXone (ROCEPHIN) 2 g in sodium chloride 0.9 % 100 mL IVPB  Status:  Discontinued        2 g 200 mL/hr over 30 Minutes Intravenous Every 24 hours 04/01/23 0823 04/01/23 2039   04/01/23 0830  azithromycin (ZITHROMAX) 500 mg in sodium chloride 0.9 % 250 mL IVPB  Status:  Discontinued        500 mg 250 mL/hr over 60 Minutes Intravenous Every 24 hours 04/01/23 0823 04/01/23 2039     .  POD/HD#: 1  72 y/o male with numerous medical comorbidities including ESRD on HD admitted for sepsis with R knee effusion and L shoulder effusion with acute on chronic changes    -sepsis with multiple septic joints---> L shoulder, R shoulder and R knee s/p I&D    All joints growing out staph  No further washouts planned  Continue with management per other services    WBAT all extremities  ROM as tolerated all extremities   Ice PRN op sites    Dressing changes as needed starting tomorrow, change for soilage as well   Plan for suture removal around POD 10                - ID Continue abx at the direction of the ID service     - Dispo:  Will continue to follow    Mearl Latin, PA-C [phone]  (C) 04/08/2023, 3:33 PM  [facility] [address] [place] [address] [phone] [phone] (F)    After 5pm and on the weekends please log on to Amion, go to orthopaedics and the look under the [facility] Call for the provider(s) on call. You can also call our office at [phone] and then follow the prompts to be connected to the call team.  Patient ID: Manson Passey, male   DOB: 1951-05-18, 72 y.o.   MRN: [id number]

## 2023-04-08 NOTE — Progress Notes (Signed)
NAME:  Peter [name], MRN:  [id number], DOB:  1951/01/16, LOS: 7 ADMISSION DATE:  04/01/2023, CONSULTATION DATE:  04/01/2023 REFERRING MD:  Dr. Thedore Mins - [facility], CHIEF COMPLAINT:   weakness/ fall/ NV   History of Present Illness:  76 yoM with PMH as below, significant for ESRD on MWF iHD via RUE AVF, afib on Eliquis, T2DM, anemia of CKD, HTN, HL, and OSA not on CPAP who presented with weakness from home. Started not to feel well on Sunday, 6/30 with nausea and vomiting. Sustained a fall, onto right knee and left shoulder pain. Admitted to [facility] on 7/2 for further workup with suspected sepsis, febrile 102.3 on admit. Blood cultures positive for MSSA bacteremia, source unclear at this time, being treated additionally for aspiration pneumonia, and mild transaminitis. Nephrology, ID, GI, and palliative care following. Ortho consulted and underwent aspiration of right knee showing many WBC, primarily PMNs. He underwent iHD 7/3 with 1.4L removed 7/3. TTE pending. Overnight, had intermittent low blood pressure episodes and patient is more sonolucent. Given concern for worsening sepsis, PCCM consulted.   Pertinent  Medical History  ESRD on MWF iHD via RUE AVF, afib on Eliquis, T2DM, anemia of CKD, HTN, HL, and OSA not on CPAP   Significant Hospital Events: Including procedures, antibiotic start and stop dates in addition to other pertinent events   7/2 admitted  7/3 iHD, right knee joint asp 7/8 to OR for I&D of right knee and bilateral shoulders.   Interim History / Subjective:  Post op remains a slightly groggy No complaints BP remains supported via low dose levophed gtt currently at 63mcg/min  Objective   Blood pressure (!) 95/57, pulse 92, temperature (!) 97.4 F (36.3 C), temperature source Axillary, resp. rate 18, height 5\' 6"  (1.676 m), weight 103.1 kg, SpO2 97 %.        Intake/Output Summary (Last 24 hours) at 04/08/2023 1128 Last data filed at 04/08/2023 1040 Gross per 24 hour  Intake 747.51 ml   Output 1500 ml  Net -752.49 ml    Filed Weights   04/04/23 1506 04/07/23 0530 04/08/23 0535  Weight: 97.4 kg 98.9 kg 103.1 kg    Examination:  General: overweight elderly male in NAD HEENT: [place]/AT, PERRL, no JVD Neuro: Somnolent, but easily arouses and interacts as expected.  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 including bilateral shoulders, possible right knee and right wrist Source unclear, possible right knee joint vs disseminated infection, r/o IE Hypotension related to above P: Monitor in ICU post-op and during HD Has required levophed gtt for BP support, goal MAP >65- also on midodrine 10mg  TID ID following, appreciate assistance Continue naficillin Appreciate ortho: post op 7/8 H/H has remained stable since OR  Aspiration PNA OSA- untreated  P: Continue supplemental oxygen for sat goal greater than 92 Encourage pulmonary hygiene Full code and would want all aggressive interventions including ventilator support if needed  ESRD on IHD MWF Anion gap metabolic acidosis P: Nephrology following, appreciate assistance IHD per nephrology HD performed early this AM 7/9  PAF  P: Continuous telemetry Optimize electrolytes Heparin on hold perioperatively. If OK with ortho we can restart 7/9 AM  Hyponatremia- improving Hyperkalemia P: BMP shows improvement in NA HD last performed  7/8 with repeat labs pending  Protein calorie malnutrition Hypoalbuminemia Physical deconditioning Fall -Albumin less than 1.5 P: Optimize nutrition   Best Practice (right click and "Reselect all SmartList Selections" daily)   Diet/type: NPO DVT prophylaxis: SCD GI prophylaxis: PPI Lines:  N/A Foley:  N/A Code Status:  full code Last date of multidisciplinary goals of care discussion: Full code with all aggressive interventions per patient   Labs   CBC: Recent Labs  Lab 04/04/23 0441 04/05/23 0315 04/06/23 0511 04/07/23 0701 04/07/23 1056 04/07/23 1453 04/07/23 2054 04/08/23 0439  WBC 8.8 10.3 15.1* 16.8*  --  19.5* 18.9* 19.0*  NEUTROABS 6.7 7.3 11.6* 12.9*  --   --   --  16.2*  HGB 11.7* 12.4* 12.6* 10.8* 11.6* 10.6* 9.5* 9.9*  HCT 33.5* 35.4* 35.0* 30.7* 34.0* 31.2* 28.2* 28.7*  MCV 96.3 95.4 94.1 92.7  --  95.4 96.2 96.6  PLT 98* 130* 185 205  --  204 196 209     Basic Metabolic Panel: Recent Labs  Lab 04/02/23 0555 04/03/23 2115 04/04/23 0441 04/06/23 0815 04/07/23 0701 04/07/23 1056 04/07/23 1453  NA 131* 130* 127* 129* 128* 123* 130*  K 5.2* 4.4 4.1 5.3* 5.1 5.5* 5.5*  CL 90* 87* 89* 90* 91* 93* 90*  CO2 21* 25 24 21* 21*  --  20*  GLUCOSE 117* 143* 108* 64* 115* 79 79  BUN 58* 68* 77* 77* 96* 85* 98*  CREATININE 7.96* 7.58* 8.18* 7.17* 8.02* 7.70* 8.13*  CALCIUM 9.1 8.7* 8.2* 7.3* 6.2*  --  6.3*  MG  --  2.2 2.3  --   --   --  2.5*  PHOS 4.7*  --   --   --   --   --  11.3*    GFR: Estimated Creatinine Clearance: 9 mL/min (A) (by C-G formula based on SCr of 8.13 mg/dL (H)). Recent Labs  Lab 04/01/23 1448 04/01/23 2315 04/02/23 0340 04/05/23 0315 04/06/23 0511 04/07/23 0701 04/07/23 1453 04/07/23 2054 04/08/23 0439  PROCALCITON  --   --    < > 23.82 18.04 13.17  --   --  10.12  WBC  --   --    < > 10.3 15.1* 16.8* 19.5* 18.9* 19.0*  LATICACIDVEN 4.0* 3.9*  --   --   --   --   --   --   --    < > = values in this interval not displayed.     Liver Function Tests: Recent Labs  Lab 04/04/23 0441 04/06/23 0815 04/07/23 0701 04/07/23 1453 04/08/23 0439  AST 230* 89* 64* 65* 57*  ALT 92* 40 29 25 19   ALKPHOS 48 80 68 65 67  BILITOT 3.4* 9.8* 10.3* 11.8* 12.9*  PROT 5.9* 6.6 6.1* 6.4* 6.5  ALBUMIN 1.7* <1.5* <1.5* <1.5*  <1.5*    No results for input(s): "LIPASE", "AMYLASE" in the last 168 hours. Recent Labs  Lab 04/02/23 0340 04/03/23 1058  AMMONIA 28 20     ABG    Component Value Date/Time   HCO3 29.6 (H) 04/03/2023 1053   TCO2 22 04/07/2023 1056   O2SAT 85.2 04/03/2023 1053     Coagulation Profile: Recent Labs  Lab 04/06/23 0511  INR 1.7*     Cardiac Enzymes: No results for input(s): "CKTOTAL", "CKMB", "CKMBINDEX", "TROPONINI" in the last 168 hours.  HbA1C: Hgb A1c MFr Bld  Date/Time Value Ref Range Status  05/30/2018 02:49 AM 5.7 (H) 4.8 - 5.6 % Final  Comment:    (NOTE) Pre diabetes:          5.7%-6.4% Diabetes:              >6.4% Glycemic control for   <7.0% adults with diabetes     CBG: Recent Labs  Lab 04/07/23 1121 04/07/23 1246 04/07/23 1900 04/07/23 2354 04/08/23 0628  GLUCAP 76 92 99 114* 85     Review of Systems:   Please see the history of present illness. All other systems reviewed and are negative   Past Medical History:  He,  has a past medical history of Anemia in chronic kidney disease (CKD), CKD (chronic kidney disease) stage 5, GFR less than 15 ml/min (HCC), Diabetes mellitus without complication (HCC) (03/2012), ED (erectile dysfunction), Heart murmur, History of kidney stones, Hypercholesteremia, Hypertension, Iron deficiency, Kidney stone (2010), MVP (mitral valve prolapse), OSA (obstructive sleep apnea), Renal oncocytoma of left kidney (2010), and Syncope.   Surgical History:   Past Surgical History:  Procedure Laterality Date   A/V FISTULAGRAM Left 02/10/2018   Procedure: A/V FISTULAGRAM;  Surgeon: Nada Libman, MD;  Location: [facility];  Service: Cardiovascular;  Laterality: Left;   APPLICATION OF WOUND VAC Right 03/15/2021   Procedure: APPLICATION OF WOUND VAC;  Surgeon: Leonie Douglas, MD;  Location: [facility] OR;  Service: Vascular;  Laterality: Right;   AV FISTULA PLACEMENT Left 06/14/2016   Procedure: ARTERIOVENOUS (AV)  FISTULA CREATION LEFT ARM;  Surgeon: Chuck Hint, MD;  Location: [facility] OR;  Service: Vascular;  Laterality: Left;   AV FISTULA PLACEMENT Right 06/09/2018   Procedure: CREATION Right arm Brachiocephalic;  Surgeon: Maeola Harman, MD;  Location: [facility] OR;  Service: Vascular;  Laterality: Right;   AV FISTULA PLACEMENT Right 01/16/2023   Procedure: INSERTION OF RIGHT ARM ARTERIOVENOUS (AV) GORE-TEX GRAFT;  Surgeon: Leonie Douglas, MD;  Location: [facility] OR;  Service: Vascular;  Laterality: Right;   BASCILIC VEIN TRANSPOSITION Right 09/10/2018   Procedure: CEPHALIC VEIN TRANSPOSITION;  Surgeon: Maeola Harman, MD;  [facility] OR;  Service: Vascular;  Laterality: Right;   BASCILIC VEIN TRANSPOSITION Left 01/25/2021   Procedure: LEFT ARMFIRST STAGE BASCILIC VEIN TRANSPOSITION;  Surgeon: Leonie Douglas, MD;  Location: [facility] OR;  Service: Vascular;  Laterality: Left;  PERIPHERAL NERVE BLOCK   BASCILIC VEIN TRANSPOSITION Left 03/15/2021   Procedure: LEFT UPPER ARM SECOND STAGE BASILIC TRANSPOSITION;  Surgeon: Leonie Douglas, MD;  Location: [facility] OR;  Service: Vascular;  Laterality: Left;   COLONOSCOPY     I & D EXTREMITY Bilateral 04/07/2023   Procedure: IRRIGATION AND DEBRIDEMENT SHOULDERS;  Surgeon: Myrene Galas, MD;  Location: [facility] OR;  Service: Orthopedics;  Laterality: Bilateral;   INSERTION OF DIALYSIS CATHETER N/A 06/09/2018   Procedure: INSERTION OF DIALYSIS CATHETER;  Surgeon: Maeola Harman, MD;  Location[facility] OR;  Service: Vascular;  Laterality: N/A;   IRRIGATION AND DEBRIDEMENT KNEE Right 04/07/2023   Procedure: IRRIGATION AND DEBRIDEMENT RIGHT KNEE;  Surgeon: Myrene Galas, MD;  Location: [facility] OR;  Service: Orthopedics;  Laterality: Right;   JOINT REPLACEMENT Left 2010   hip   PARTIAL NEPHRECTOMY Left 2010   PERIPHERAL VASCULAR INTERVENTION Left 02/10/2018   Procedure: PERIPHERAL VASCULAR INTERVENTION;  Surgeon: Nada Libman, MD;  Location: [facility];   Service: Cardiovascular;  Laterality: Left;   TONSILLECTOMY       Social History:   reports that he quit smoking about 36 years ago. His smoking use included cigarettes. His  smokeless tobacco use includes chew. He reports that he does not drink alcohol and does not use drugs.   Family History:  His family history includes Dementia in his mother; Hypertension in his father.   Allergies Allergies  Allergen Reactions   Doxazosin Mesylate     ineffective for BP (10/2014)   Triamterene-Hctz     increased creatinine (see 09/2014 office note)     Home Medications  Prior to Admission medications   Medication Sig Start Date End Date Taking? Authorizing Provider  acetaminophen (TYLENOL) 500 MG tablet Take 1,000 mg by mouth as needed for mild pain or moderate pain.   Yes [provider]  apixaban (ELIQUIS) 5 MG TABS tablet Take 1 tablet (5 mg total) by mouth 2 (two) times daily. 02/26/23  Yes Rollene Rotunda, MD  atorvastatin (LIPITOR) 10 MG tablet Take 10 mg by mouth at bedtime.   Yes [provider]  AURYXIA 1 GM 210 MG(Fe) tablet Take 420-630 mg by mouth See admin instructions. Take 630mg  by mouth three times daily with meals and take 420mg  by mouth with snacks.   Yes [provider]  VITAMIN D, CHOLECALCIFEROL, PO Take 1 tablet by mouth once a week. Patient had Dialysis on Monday, Wednesday, and Friday. 03/24/23 03/22/24 Yes [provider]     Critical care time: 32 min    Joneen Roach, AGACNP-BC [facility]  See Amion for personal pager PCCM on call pager [phone] until 7pm. Please call Elink 7p-7a. [phone]  04/08/2023 11:28 AM

## 2023-04-08 NOTE — TOC Initial Note (Addendum)
Transition of Care [facility]) - Initial/Assessment Note    Patient Details  Name: Peter [name] MRN: [id number] Date of Birth: [birth year]  Transition of Care [facility]) CM/SW Contact:    Delilah Shan, LCSWA Phone Number: 04/08/2023, 3:49 PM  Clinical Narrative:                  CSW received consult for possible SNF placement at time of discharge. CSW spoke with patient at bedside and patients significant other [name] regarding PT recommendation of SNF placement at time of discharge. Patient reports PTA he comes from home alone.Patient expressed understanding of PT recommendation and is agreeable to SNF placement at time of discharge.Patients number one choice is [facility]. Patient gave CSW permission to fax out initial referral near the [facility] area for possible SNF placement.CSW informed patient and patients significant other CSW will follow to fax patient out for SNF closer to patient being medically ready for dc.CSW discussed insurance authorization process and will provide Medicare compare SNF ratings list with accepted SNF bed offers when available. No further questions reported at this time. CSW to continue to follow and assist with discharge planning needs.   Expected Discharge Plan: Skilled Nursing Facility Barriers to Discharge: Continued Medical Work up   Patient Goals and CMS Choice Patient states their goals for this hospitalization and ongoing recovery are:: SNF   Choice offered to / list presented to : Patient      Expected Discharge Plan and Services In-house Referral: Clinical Social Work   Post Acute Care Choice: Dialysis Living arrangements for the past 2 months: Single Family Home                                      Prior Living Arrangements/Services Living arrangements for the past 2 months: Single Family Home Lives with:: Self Patient language and need for interpreter reviewed:: Yes Do you feel safe going back to the place where you live?: No   SNF   Need for Family Participation in Patient Care: Yes (Comment) Care giver support system in place?: Yes (comment)   Criminal Activity/Legal Involvement Pertinent to Current Situation/Hospitalization: No - Comment as needed  Activities of Daily Living      Permission Sought/Granted Permission sought to share information with : Case Manager, Magazine features editor, Family Supports Permission granted to share information with : Yes, Verbal Permission Granted  Share Information with NAME: [name]  Permission granted to share info w AGENCY: SNF  Permission granted to share info w Relationship: Signigicant other  Permission granted to share info w Contact Information: [name] [phone]  Emotional Assessment Appearance:: Appears stated age Attitude/Demeanor/Rapport: Gracious Affect (typically observed): Calm Orientation: : Oriented to Self, Oriented to Place, Oriented to  Time, Oriented to Situation Alcohol / Substance Use: Not Applicable Psych Involvement: No (comment)  Admission diagnosis:  Aspiration pneumonia (HCC) [J69.0] Sepsis, due to unspecified organism, unspecified whether acute organ dysfunction present [facility]) [A41.9] Patient Active Problem List   Diagnosis Date Noted   Sepsis (HCC) 04/06/2023   Staphylococcal arthritis of right shoulder (HCC) 04/04/2023   MSSA bacteremia 04/03/2023   Staphylococcal arthritis of right knee (HCC) 04/03/2023   Staphylococcal arthritis of right wrist (HCC) 04/03/2023   Staphylococcal arthritis of left shoulder (HCC) 04/03/2023   Aspiration pneumonia (HCC) 04/01/2023   PAF (paroxysmal atrial fibrillation) (HCC) 03/18/2023   Chronic diastolic HF (heart failure) (HCC) 02/24/2023   Bradycardia 02/24/2023  Arteriovenous fistula, acquired (HCC) 03/29/2021   Chronic kidney disease due to benign hypertension 03/29/2021   Chronic kidney disease, stage 5 (HCC) 03/29/2021   Dependence on renal dialysis (HCC) 03/29/2021   Diabetic renal  disease (HCC) 03/29/2021   Kidney stone 03/29/2021   Erectile dysfunction 03/29/2021   Personal history of colonic polyps 03/29/2021   Pure hypercholesterolemia 03/29/2021   Encounter for removal of sutures 09/01/2020   Headache, unspecified 05/06/2020   Allergy, unspecified, initial encounter 05/03/2019   Anaphylactic shock, unspecified, initial encounter 05/03/2019   ESRD on dialysis (HCC) 10/08/2018   Hypokalemia 06/25/2018   Unspecified protein-calorie malnutrition (HCC) 06/24/2018   Encounter for immunization 06/22/2018   Anemia in chronic kidney disease 06/08/2018   Coagulation defect, unspecified (HCC) 06/08/2018   Complication of vascular dialysis catheter 06/08/2018   Hyperparathyroidism due to renal insufficiency (HCC) 06/08/2018   Peripheral vascular disease (HCC) 06/08/2018   Iron deficiency anemia, unspecified 06/08/2018   Pruritus, unspecified 06/08/2018   Shortness of breath 06/08/2018   Type 2 diabetes mellitus with other diabetic kidney complication (HCC) 06/08/2018   Elevated troponin 05/30/2018   Elevated TSH 05/30/2018   OSA on CPAP 05/29/2018   Syncope and collapse 05/29/2018   Syncope 05/29/2018   Morbid obesity (HCC) 10/13/2013   Diastolic dysfunction 10/13/2013   MVP (mitral valve prolapse)    Obstructive sleep apnea 09/27/2013   Essential hypertension, benign 09/27/2013   PCP:  [name], MD Pharmacy:   [facility] [phone] - [place], [place] - [address] AT [facility] [address] [place] [address] Phone: [phone] Fax: [phone]     Social Determinants of Health (SDOH) Social History: SDOH Screenings   Food Insecurity: No Food Insecurity (04/01/2023)  Housing: Patient Declined (04/01/2023)  Transportation Needs: No Transportation Needs (04/01/2023)  Utilities: Not At Risk (04/01/2023)  Financial Resource Strain: Low Risk  (05/30/2018)  Physical Activity: Inactive (05/30/2018)  Social Connections: Socially  Integrated (05/30/2018)  Stress: No Stress Concern Present (05/30/2018)  Tobacco Use: High Risk (04/08/2023)   SDOH Interventions:     Readmission Risk Interventions     No data to display

## 2023-04-09 DIAGNOSIS — M25431 Effusion, right wrist: Secondary | ICD-10-CM | POA: Diagnosis not present

## 2023-04-09 DIAGNOSIS — A419 Sepsis, unspecified organism: Secondary | ICD-10-CM | POA: Diagnosis not present

## 2023-04-09 DIAGNOSIS — M00061 Staphylococcal arthritis, right knee: Secondary | ICD-10-CM | POA: Diagnosis not present

## 2023-04-09 DIAGNOSIS — B9561 Methicillin susceptible Staphylococcus aureus infection as the cause of diseases classified elsewhere: Secondary | ICD-10-CM | POA: Diagnosis not present

## 2023-04-09 DIAGNOSIS — M25462 Effusion, left knee: Secondary | ICD-10-CM | POA: Diagnosis not present

## 2023-04-09 DIAGNOSIS — R7881 Bacteremia: Secondary | ICD-10-CM | POA: Diagnosis not present

## 2023-04-09 DIAGNOSIS — A4101 Sepsis due to Methicillin susceptible Staphylococcus aureus: Secondary | ICD-10-CM | POA: Diagnosis not present

## 2023-04-09 LAB — HEMOGLOBIN AND HEMATOCRIT, BLOOD
HCT: 27 % — ABNORMAL LOW (ref 39.0–52.0)
HCT: 26.8 % — ABNORMAL LOW (ref 39.0–52.0)
HCT: 27.3 % — ABNORMAL LOW (ref 39.0–52.0)
Hemoglobin: 9.6 g/dL — ABNORMAL LOW (ref 13.0–17.0)
Hemoglobin: 9.8 g/dL — ABNORMAL LOW (ref 13.0–17.0)
Hemoglobin: 9.6 g/dL — ABNORMAL LOW (ref 13.0–17.0)

## 2023-04-09 LAB — CBC WITH DIFFERENTIAL/PLATELET
Abs Immature Granulocytes: 0.3 10*3/uL — ABNORMAL HIGH (ref 0.00–0.07)
Basophils Absolute: 0.3 10*3/uL — ABNORMAL HIGH (ref 0.0–0.1)
Basophils Relative: 1 %
Eosinophils Absolute: 0.3 10*3/uL (ref 0.0–0.5)
Eosinophils Relative: 1 %
HCT: 30.1 % — ABNORMAL LOW (ref 39.0–52.0)
Hemoglobin: 10.6 g/dL — ABNORMAL LOW (ref 13.0–17.0)
Lymphocytes Relative: 2 %
Lymphs Abs: 0.5 10*3/uL — ABNORMAL LOW (ref 0.7–4.0)
MCH: 32.6 pg (ref 26.0–34.0)
MCHC: 35.2 g/dL (ref 30.0–36.0)
MCV: 92.6 fL (ref 80.0–100.0)
Metamyelocytes Relative: 1 %
Monocytes Absolute: 0.3 10*3/uL (ref 0.1–1.0)
Monocytes Relative: 1 %
Neutro Abs: 25.4 10*3/uL — ABNORMAL HIGH (ref 1.7–7.7)
Neutrophils Relative %: 94 %
Platelets: 211 10*3/uL (ref 150–400)
RBC: 3.25 MIL/uL — ABNORMAL LOW (ref 4.22–5.81)
RDW: 15.7 % — ABNORMAL HIGH (ref 11.5–15.5)
WBC: 27 10*3/uL — ABNORMAL HIGH (ref 4.0–10.5)
nRBC: 0 /100{WBCs}
nRBC: 0.1 % (ref 0.0–0.2)

## 2023-04-09 LAB — MAGNESIUM
Magnesium: 2.3 mg/dL (ref 1.7–2.4)
Magnesium: 2 mg/dL (ref 1.7–2.4)

## 2023-04-09 LAB — BASIC METABOLIC PANEL
Anion gap: 14 (ref 5–15)
Anion gap: 19 — ABNORMAL HIGH (ref 5–15)
BUN: 47 mg/dL — ABNORMAL HIGH (ref 8–23)
BUN: 34 mg/dL — ABNORMAL HIGH (ref 8–23)
CO2: 21 mmol/L — ABNORMAL LOW (ref 22–32)
CO2: 21 mmol/L — ABNORMAL LOW (ref 22–32)
Calcium: 6.1 mg/dL — CL (ref 8.9–10.3)
Calcium: 6.9 mg/dL — ABNORMAL LOW (ref 8.9–10.3)
Chloride: 90 mmol/L — ABNORMAL LOW (ref 98–111)
Chloride: 90 mmol/L — ABNORMAL LOW (ref 98–111)
Creatinine, Ser: 5.07 mg/dL — ABNORMAL HIGH (ref 0.61–1.24)
Creatinine, Ser: 4.12 mg/dL — ABNORMAL HIGH (ref 0.61–1.24)
GFR, Estimated: 11 mL/min — ABNORMAL LOW (ref >60)
GFR, Estimated: 14 mL/min — ABNORMAL LOW (ref >60)
Glucose, Bld: 112 mg/dL — ABNORMAL HIGH (ref 70–99)
Glucose, Bld: 178 mg/dL — ABNORMAL HIGH (ref 70–99)
Potassium: 3.9 mmol/L (ref 3.5–5.1)
Potassium: 3.5 mmol/L (ref 3.5–5.1)
Sodium: 125 mmol/L — ABNORMAL LOW (ref 135–145)
Sodium: 130 mmol/L — ABNORMAL LOW (ref 135–145)

## 2023-04-09 LAB — HEPATIC FUNCTION PANEL
ALT: 19 U/L (ref 0–44)
AST: 55 U/L — ABNORMAL HIGH (ref 15–41)
Albumin: <1.5 g/dL — ABNORMAL LOW (ref 3.5–5.0)
Alkaline Phosphatase: 106 U/L (ref 38–126)
Bilirubin, Direct: 4.2 mg/dL — ABNORMAL HIGH (ref 0.0–0.2)
Indirect Bilirubin: 6.2 mg/dL — ABNORMAL HIGH (ref 0.3–0.9)
Total Bilirubin: 10.4 mg/dL — ABNORMAL HIGH (ref 0.3–1.2)
Total Protein: 6.8 g/dL (ref 6.5–8.1)

## 2023-04-09 LAB — PHOSPHORUS: Phosphorus: 7 mg/dL — ABNORMAL HIGH (ref 2.5–4.6)

## 2023-04-09 LAB — TROPONIN I (HIGH SENSITIVITY)
Troponin I (High Sensitivity): 137 ng/L (ref <18)
Troponin I (High Sensitivity): 156 ng/L (ref <18)

## 2023-04-09 LAB — AEROBIC/ANAEROBIC CULTURE W GRAM STAIN (SURGICAL/DEEP WOUND)

## 2023-04-09 LAB — GLUCOSE, CAPILLARY
Glucose-Capillary: 100 mg/dL — ABNORMAL HIGH (ref 70–99)
Glucose-Capillary: 89 mg/dL (ref 70–99)
Glucose-Capillary: 128 mg/dL — ABNORMAL HIGH (ref 70–99)

## 2023-04-09 LAB — PROCALCITONIN: Procalcitonin: 6.64 ng/mL

## 2023-04-09 MED ORDER — HEPARIN SODIUM (PORCINE) 1000 UNIT/ML DIALYSIS
2000.0000 [IU] | INTRAMUSCULAR | Status: AC | PRN
  Administered 2023-04-11: 2000 [IU] via INTRAVENOUS_CENTRAL
  Filled 2023-04-09: qty 2

## 2023-04-09 MED ORDER — SODIUM CHLORIDE 0.9 % IV SOLN
4.0000 g | Freq: Once | INTRAVENOUS | Status: AC
  Administered 2023-04-09: 4 g via INTRAVENOUS
  Filled 2023-04-09: qty 40

## 2023-04-09 MED ORDER — HEPARIN SODIUM (PORCINE) 1000 UNIT/ML DIALYSIS
4000.0000 [IU] | Freq: Once | INTRAMUSCULAR | Status: AC
  Administered 2023-04-09: 4000 [IU] via INTRAVENOUS_CENTRAL

## 2023-04-09 MED ORDER — MIDODRINE HCL 5 MG PO TABS: 15.0000 mg | ORAL_TABLET | Freq: Three times a day (TID) | ORAL | Status: DC

## 2023-04-09 MED ORDER — VASOPRESSIN 20 UNITS/100 ML INFUSION FOR SHOCK
0.0400 [IU]/min | INTRAVENOUS | Status: DC
  Administered 2023-04-09: 0.04 [IU]/min via INTRAVENOUS
  Administered 2023-04-09: 0.03 [IU]/min via INTRAVENOUS
  Administered 2023-04-09: 0.04 [IU]/min via INTRAVENOUS
  Administered 2023-04-10: 0.02 [IU]/min via INTRAVENOUS
  Filled 2023-04-09: qty 100
  Filled 2023-04-09: qty 200
  Filled 2023-04-09 (×2): qty 100

## 2023-04-09 MED ORDER — ENSURE ENLIVE PO LIQD
237.0000 mL | Freq: Three times a day (TID) | ORAL | Status: DC
  Administered 2023-04-09 – 2023-04-17 (×21): 237 mL via ORAL

## 2023-04-09 MED ORDER — ALBUMIN HUMAN 25 % IV SOLN
25.0000 g | Freq: Once | INTRAVENOUS | Status: AC
  Administered 2023-04-09: 25 g via INTRAVENOUS
  Filled 2023-04-09: qty 100

## 2023-04-09 MED ORDER — MIDODRINE HCL 5 MG PO TABS
15.0000 mg | ORAL_TABLET | Freq: Three times a day (TID) | ORAL | Status: DC
  Administered 2023-04-09 – 2023-04-14 (×15): 15 mg via ORAL
  Filled 2023-04-09 (×15): qty 3

## 2023-04-09 NOTE — Progress Notes (Addendum)
Occupational Therapy Treatment Patient Details Name: Peter [name] MRN: [id number] DOB: 02-01-1951 Today's Date: 04/09/2023   History of present illness Pt is a 72 y.o. male admitted 04/01/23 after presenting to ED with generalized weakness, nausea, vomiting and intermittent fevers and chills since Sunday morning. Pt subsequently found have aspiration pneumonia and found to be septic with blood cultures positive for Staph aureus, BCID with MSSA. Pt further reported falling on Sunday evening, unable to get up and on the floor for a few hours, bruising and swelling noted around Left eye/face. PMH significant of ESRD on HD MWF, Type 2 DM, anemia of CKD, hypertension, paroxysmal atrial fibrillation, and hyperlipidemia.   OT comments  Pt with slow progression towards goals, tolerating AAROM to BUE and able to complete grooming task in bed/chair position. Pt with painful RUE/RLE, began education on manual edema mobilization and importance of elevation for edema control. Provided pt with green (for L) and yellow foam (for R) cube for BUE strengthening and pt able to demo. Pt presenting with impairments listed below, will follow acutely. Patient will benefit from continued inpatient follow up therapy, <3 hours/day to maximize safety/ind with ADLs/functional mobility.    Recommendations for follow up therapy are one component of a multi-disciplinary discharge planning process, led by the attending physician.  Recommendations may be updated based on patient status, additional functional criteria and insurance authorization.    Assistance Recommended at Discharge Frequent or constant Supervision/Assistance  Patient can return home with the following  Two people to help with walking and/or transfers;Two people to help with bathing/dressing/bathroom;Assistance with cooking/housework;Assistance with feeding;Direct supervision/assist for medications management;Direct supervision/assist for financial  management;Assist for transportation;Help with stairs or ramp for entrance   Equipment Recommendations  Other (comment) (defer)    Recommendations for Other Services PT consult    Precautions / Restrictions Precautions Precautions: Fall Precaution Comments: watch O2 and BP; R knee and R wrist pain Restrictions Weight Bearing Restrictions: No       Mobility Bed Mobility               General bed mobility comments: pt declines    Transfers                   General transfer comment: deferred     Balance                                           ADL either performed or assessed with clinical judgement   ADL Overall ADL's : Needs assistance/impaired Eating/Feeding: NPO   Grooming: Moderate assistance;Bed level Grooming Details (indicate cue type and reason): washing face with LUE, hand over hand assist                                    Extremity/Trunk Assessment Upper Extremity Assessment Upper Extremity Assessment: Generalized weakness RUE Deficits / Details: minimal activated noted in digits can perform AAROM of R shoulder to 90* RUE Coordination: decreased fine motor;decreased gross motor LUE Deficits / Details: grasp 3/5, can flex shoulder to 90* with active assist LUE Coordination: decreased fine motor;decreased gross motor   Lower Extremity Assessment Lower Extremity Assessment: Defer to PT evaluation        Vision   Additional Comments: will further assess   Perception Perception Perception: Not tested  Praxis Praxis Praxis: Not tested    Cognition Arousal/Alertness: Lethargic Behavior During Therapy: Flat affect Overall Cognitive Status: Impaired/Different from baseline Area of Impairment: Memory, Following commands, Safety/judgement, Awareness, Problem solving, Attention                   Current Attention Level: Focused Memory: Decreased short-term memory Following Commands: Follows  one step commands with increased time, Follows one step commands inconsistently Safety/Judgement: Decreased awareness of safety, Decreased awareness of deficits Awareness: Emergent Problem Solving: Slow processing, Requires verbal cues, Requires tactile cues General Comments: returns to lethargic state after roused initially, needs consistent cues to keep alert; pain focused        Exercises Exercises: General Upper Extremity General Exercises - Upper Extremity Shoulder Flexion: AAROM, Both, Seated, 5 reps Elbow Flexion: AROM, Left, 5 reps, Seated Other Exercises Other Exercises: squeeze ball BUE x5 Other Exercises: educated family on manual edema mobilization technique and elevation for edema control    Shoulder Instructions       General Comments VSS    Pertinent Vitals/ Pain       Pain Assessment Pain Assessment: Faces Pain Score: 8  Faces Pain Scale: Hurts whole lot Pain Location: generalized, most at R wrist, R knee Pain Descriptors / Indicators: Aching, Discomfort, Grimacing, Guarding Pain Intervention(s): Limited activity within patient's tolerance, Monitored during session, Repositioned  Home Living                                          Prior Functioning/Environment              Frequency  Min 2X/week        Progress Toward Goals  OT Goals(current goals can now be found in the care plan section)  Progress towards OT goals: Progressing toward goals  Acute Rehab OT Goals Patient Stated Goal: none stated OT Goal Formulation: With patient Time For Goal Achievement: 04/16/23 Potential to Achieve Goals: Good ADL Goals Pt Will Perform Grooming: with supervision;sitting Pt Will Perform Lower Body Bathing: sit to/from stand;with min assist Pt Will Perform Upper Body Dressing: with supervision;sitting Pt Will Perform Lower Body Dressing: sit to/from stand;with min assist Pt Will Transfer to Toilet: with min assist;ambulating;bedside  commode Pt Will Perform Toileting - Clothing Manipulation and hygiene: with min guard assist;sit to/from stand  Plan Discharge plan remains appropriate;Frequency remains appropriate    Co-evaluation                 AM-PAC OT "6 Clicks" Daily Activity     Outcome Measure   Help from another person eating meals?: A Lot Help from another person taking care of personal grooming?: A Lot Help from another person toileting, which includes using toliet, bedpan, or urinal?: Total Help from another person bathing (including washing, rinsing, drying)?: Total Help from another person to put on and taking off regular upper body clothing?: A Lot Help from another person to put on and taking off regular lower body clothing?: Total 6 Click Score: 9    End of Session    OT Visit Diagnosis: History of falling (Z91.81);Other abnormalities of gait and mobility (R26.89);Muscle weakness (generalized) (M62.81);Ataxia, unspecified (R27.0);Pain Pain - Right/Left: Right Pain - part of body: Arm   Activity Tolerance Patient limited by fatigue;Patient limited by lethargy   Patient Left in bed;with call bell/phone within reach;with bed alarm set;with family/visitor present  Nurse Communication Mobility status        Time: 1610-9604 OT Time Calculation (min): 30 min  Charges: OT General Charges $OT Visit: 1 Visit OT Treatments $Self Care/Home Management : 8-22 mins $Therapeutic Activity: 8-22 mins  Carver Fila, OTD, OTR/L SecureChat Preferred Acute Rehab [phone]   Dalphine Handing 04/09/2023, 4:51 PM

## 2023-04-09 NOTE — Progress Notes (Signed)
[facility]  Date of Admission:  04/01/2023      Lines: 7/10-c left radial a-line 7/8-c left internal jugular cvc  Left avg/avf  Abx: 7/04-c nafcillin  7/07 intraarticular tobramycin 7/04 amp/sulbactam 7/02-03 cefazolin  ASSESSMENT: 72 yo male with esrd on iHD (right UE avg), p-afib on anticoagulation, admitted 7/02 with sepsis in setting disseminated mssa infection/bsi  Pain right knee/bilateral shoulder/right wrist on presentation and ?neck  mri showed effusion bilateral glenohumeral joint and left  xray showed right knee effusion/arthritis Right wrist ct showed mild effusion Tte with question of MAC vs true vegetation  S/p right knee aspiration 7/03 -- cx mssa S/p IR bilateral shoulder arthrocentesis 7/06 -- cx mssa on right, not left 7/08 another sample noted for left shoulder and that grew mssa   7/2 blood cx mssa 7/4 blood cx ngtd   ------- 7/10 assessment Transferred to icu 7/07 for soft blood pressure  S/p I&D of bilateral shoulder and right knee 7/08. Attempt to aspirate the right wrist doesn't yield any fluid  No sign of rue avg infection  Mentation no obvious decline and to me has been improving. Concern about cns involvement and had been on nafcillin since 7/4. Will get brain mri and see if we can change to less toxic abx with cefazolin  7/09 increased pressors requirement and increasing leukocytosis. Cardiology want to do tee which from my standpoint is always a plus. If continued leukocytosis or other sign of worsening clinical status, will get abdominal pelvis ct with contrast as well to look for developing abscess that can be drained  He does behave like endocarditis -- 7/10 exam noted probable janeway lesion left 2nd toe (see picture)  PLAN: Brain mri without contrast -- if no concern for brain abscess/cerebritis then will change nafcillin to cefazolin Cardiology planning tee If increasing leukocytosis or  ongoing sepsis will get abd pelv ct with contrast in the next 24-48 hours Discussed with primary team   I spent more than 50 minute reviewing data/chart, and coordinating care, providing direct face to face time providing counseling/discussing diagnostics/treatment plan with patient and treatment team   Principal Problem:   MSSA bacteremia Active Problems:   Obstructive sleep apnea   Essential hypertension, benign   MVP (mitral valve prolapse)   ESRD on dialysis (HCC)   Anemia in chronic kidney disease   PAF (paroxysmal atrial fibrillation) (HCC)   Aspiration pneumonia (HCC)   Staphylococcal arthritis of right knee (HCC)   Staphylococcal arthritis of right wrist (HCC)   Staphylococcal arthritis of left shoulder (HCC)   Staphylococcal arthritis of right shoulder (HCC)   Sepsis (HCC)   Allergies  Allergen Reactions   Doxazosin Mesylate     ineffective for BP (10/2014)   Triamterene-Hctz     increased creatinine (see 09/2014 office note)    Scheduled Meds:  (feeding supplement) PROSource Plus  30 mL Oral BID BM   atorvastatin  10 mg Oral QHS   Chlorhexidine Gluconate Cloth  6 each Topical Q0600   Chlorhexidine Gluconate Cloth  6 each Topical Q0600   ferric citrate  630 mg Oral TID WC   midodrine  15 mg Oral TID WC   multivitamin  1 tablet Oral QHS   pantoprazole (PROTONIX) IV  40 mg Intravenous Q12H   Continuous Infusions:  sodium chloride     anticoagulant sodium citrate     nafcillin IV Stopped (04/09/23 1212)   norepinephrine (LEVOPHED) Adult infusion  6 mcg/min (04/09/23 1317)   vasopressin 0.04 Units/min (04/09/23 1316)   PRN Meds:.Place/Maintain arterial line **AND** sodium chloride, acetaminophen, alteplase, anticoagulant sodium citrate, diltiazem, heparin, heparin, [START ON 04/10/2023] heparin, lidocaine (PF), lidocaine-prilocaine, mouth rinse, oxyCODONE, pentafluoroprop-tetrafluoroeth   SUBJECTIVE: On 2 pressors last 24 hours increasing leukocytosis No  complaint Reports significant improvement in right wrist/knee and bilateral shoulder pain  No confusion No headache No visual change      Review of Systems: ROS All other ROS was negative, except mentioned above     OBJECTIVE: Vitals:   04/09/23 1145 04/09/23 1155 04/09/23 1200 04/09/23 1215  BP:      Pulse: 89  99 (!) 104  Resp: (!) 22  19 18   Temp:  98.1 F (36.7 C)    TempSrc:  Oral    SpO2: 98%  98% 97%  Weight:      Height:       Body mass index is 37.01 kg/m.  Physical Exam General/constitutional: no distress, pleasant, conversant, appropriate; anasarca HEENT: Normocephalic, PER, Conj Clear Cv: rrr no mrg Lungs: clear to auscultation, normal respiratory effort Abd: Soft, Nontender Ext: anasarca Skin: right upper ext avg felt and nontender; left 2nd distal toe black papule with slight ulceration nontender Neuro: somnolent MSK: bilateral shoulder, right knee dressing clean/dry  Central line presence: left internal jugular cvc site clean  7/10 picture   Lab Results Lab Results  Component Value Date   WBC 27.0 (H) 04/09/2023   HGB 9.6 (L) 04/09/2023   HCT 27.0 (L) 04/09/2023   MCV 92.6 04/09/2023   PLT 211 04/09/2023    Lab Results  Component Value Date   CREATININE 5.07 (H) 04/09/2023   BUN 47 (H) 04/09/2023   NA 125 (L) 04/09/2023   K 3.9 04/09/2023   CL 90 (L) 04/09/2023   CO2 21 (L) 04/09/2023    Lab Results  Component Value Date   ALT 19 04/09/2023   AST 55 (H) 04/09/2023   ALKPHOS 106 04/09/2023   BILITOT 10.4 (H) 04/09/2023      Microbiology: Recent Results (from the past 240 hour(s))  Resp panel by RT-PCR (RSV, Flu A&B, Covid) Anterior Nasal Swab     Status: None   Collection Time: 04/01/23  8:23 AM   Specimen: Anterior Nasal Swab  Result Value Ref Range Status   SARS Coronavirus 2 by RT PCR NEGATIVE NEGATIVE Final   Influenza A by PCR NEGATIVE NEGATIVE Final   Influenza B by PCR NEGATIVE NEGATIVE Final    Comment:  (NOTE) The Xpert Xpress SARS-CoV-2/FLU/RSV plus assay is intended as an aid in the diagnosis of influenza from Nasopharyngeal swab specimens and should not be used as a sole basis for treatment. Nasal washings and aspirates are unacceptable for Xpert Xpress SARS-CoV-2/FLU/RSV testing.  Fact Sheet for Patients: BloggerCourse.com  Fact Sheet for Healthcare Providers: SeriousBroker.it  This test is not yet approved or cleared by the [place] FDA and has been authorized for detection and/or diagnosis of SARS-CoV-2 by FDA under an Emergency Use Authorization (EUA). This EUA will remain in effect (meaning this test can be used) for the duration of the COVID-19 declaration under Section 564(b)(1) of the Act, 21 U.S.C. section 360bbb-3(b)(1), unless the authorization is terminated or revoked.     Resp Syncytial Virus by PCR NEGATIVE NEGATIVE Final    Comment: (NOTE) Fact Sheet for Patients: BloggerCourse.com  Fact Sheet for Healthcare Providers: SeriousBroker.it  This test is not yet approved or cleared by the [place]  FDA and has been authorized for detection and/or diagnosis of SARS-CoV-2 by FDA under an Emergency Use Authorization (EUA). This EUA will remain in effect (meaning this test can be used) for the duration of the COVID-19 declaration under Section 564(b)(1) of the Act, 21 U.S.C. section 360bbb-3(b)(1), unless the authorization is terminated or revoked.  Performed at [facility], [address], [place], [place] [address]   Blood Culture (routine x 2)     Status: Abnormal   Collection Time: 04/01/23  8:51 AM   Specimen: BLOOD LEFT FOREARM  Result Value Ref Range Status   Specimen Description BLOOD LEFT FOREARM  Final   Special Requests   Final    BOTTLES DRAWN AEROBIC AND ANAEROBIC Blood Culture results may not be optimal due to an excessive volume of  blood received in culture bottles   Culture  Setup Time   Final    GRAM POSITIVE COCCI IN CLUSTERS IN BOTH AEROBIC AND ANAEROBIC BOTTLES CRITICAL RESULT CALLED TO, READ BACK BY AND VERIFIED WITH: PHARMD HAILEY VONDOLEN ON 04/01/23 @ 2017 BY DRT Performed at [facility], [address], [place], [place] [address]    Culture STAPHYLOCOCCUS AUREUS (A)  Final   Report Status 04/03/2023 FINAL  Final   Organism ID, Bacteria STAPHYLOCOCCUS AUREUS  Final      Susceptibility   Staphylococcus aureus - MIC*    CIPROFLOXACIN <=0.5 SENSITIVE Sensitive     ERYTHROMYCIN <=0.25 SENSITIVE Sensitive     GENTAMICIN <=0.5 SENSITIVE Sensitive     OXACILLIN <=0.25 SENSITIVE Sensitive     TETRACYCLINE <=1 SENSITIVE Sensitive     VANCOMYCIN 1 SENSITIVE Sensitive     TRIMETH/SULFA <=10 SENSITIVE Sensitive     CLINDAMYCIN <=0.25 SENSITIVE Sensitive     RIFAMPIN <=0.5 SENSITIVE Sensitive     Inducible Clindamycin NEGATIVE Sensitive     LINEZOLID 2 SENSITIVE Sensitive     * STAPHYLOCOCCUS AUREUS  Blood Culture ID Panel (Reflexed)     Status: Abnormal   Collection Time: 04/01/23  8:51 AM  Result Value Ref Range Status   Enterococcus faecalis NOT DETECTED NOT DETECTED Final   Enterococcus Faecium NOT DETECTED NOT DETECTED Final   Listeria monocytogenes NOT DETECTED NOT DETECTED Final   Staphylococcus species DETECTED (A) NOT DETECTED Final    Comment: CRITICAL RESULT CALLED TO, READ BACK BY AND VERIFIED WITH: PHARMD HAILEY VONDOLEN ON 04/01/23 @ 2017 BY DRT    Staphylococcus aureus (BCID) DETECTED (A) NOT DETECTED Final    Comment: CRITICAL RESULT CALLED TO, READ BACK BY AND VERIFIED WITH: PHARMD HAILEY VONDOLEN ON 04/01/23 @ 2017 BY DRT    Staphylococcus epidermidis NOT DETECTED NOT DETECTED Final   Staphylococcus lugdunensis NOT DETECTED NOT DETECTED Final   Streptococcus species NOT DETECTED NOT DETECTED Final   Streptococcus agalactiae NOT DETECTED NOT DETECTED Final   Streptococcus pneumoniae NOT  DETECTED NOT DETECTED Final   Streptococcus pyogenes NOT DETECTED NOT DETECTED Final   A.calcoaceticus-baumannii NOT DETECTED NOT DETECTED Final   Bacteroides fragilis NOT DETECTED NOT DETECTED Final   Enterobacterales NOT DETECTED NOT DETECTED Final   Enterobacter cloacae complex NOT DETECTED NOT DETECTED Final   Escherichia coli NOT DETECTED NOT DETECTED Final   Klebsiella aerogenes NOT DETECTED NOT DETECTED Final   Klebsiella oxytoca NOT DETECTED NOT DETECTED Final   Klebsiella pneumoniae NOT DETECTED NOT DETECTED Final   Proteus species NOT DETECTED NOT DETECTED Final   Salmonella species NOT DETECTED NOT DETECTED Final   Serratia marcescens  NOT DETECTED NOT DETECTED Final   Haemophilus influenzae NOT DETECTED NOT DETECTED Final   Neisseria meningitidis NOT DETECTED NOT DETECTED Final   Pseudomonas aeruginosa NOT DETECTED NOT DETECTED Final   Stenotrophomonas maltophilia NOT DETECTED NOT DETECTED Final   Candida albicans NOT DETECTED NOT DETECTED Final   Candida auris NOT DETECTED NOT DETECTED Final   Candida glabrata NOT DETECTED NOT DETECTED Final   Candida krusei NOT DETECTED NOT DETECTED Final   Candida parapsilosis NOT DETECTED NOT DETECTED Final   Candida tropicalis NOT DETECTED NOT DETECTED Final   Cryptococcus neoformans/gattii NOT DETECTED NOT DETECTED Final   Meth resistant mecA/C and MREJ NOT DETECTED NOT DETECTED Final    Comment: Performed at [facility], [address], [place], [place] [address]  Blood Culture (routine x 2)     Status: None   Collection Time: 04/01/23  1:09 PM   Specimen: BLOOD LEFT HAND  Result Value Ref Range Status   Specimen Description BLOOD LEFT HAND  Final   Special Requests   Final    BOTTLES DRAWN AEROBIC AND ANAEROBIC Blood Culture adequate volume   Culture   Final    NO GROWTH 5 DAYS Performed at [facility], [address] [place], [place] [address]    Report Status 04/06/2023 FINAL  Final  Culture, blood (Routine X  2) w Reflex to ID Panel     Status: None   Collection Time: 04/01/23 11:15 PM   Specimen: BLOOD  Result Value Ref Range Status   Specimen Description BLOOD BLOOD LEFT ARM  Final   Special Requests   Final    BOTTLES DRAWN AEROBIC AND ANAEROBIC Blood Culture results may not be optimal due to an inadequate volume of blood received in culture bottles   Culture   Final    NO GROWTH 5 DAYS Performed at [facility], [address], [place], [place] [address]    Report Status 04/06/2023 FINAL  Final  Culture, blood (Routine X 2) w Reflex to ID Panel     Status: None   Collection Time: 04/01/23 11:21 PM   Specimen: BLOOD  Result Value Ref Range Status   Specimen Description BLOOD BLOOD LEFT HAND  Final   Special Requests   Final    BOTTLES DRAWN AEROBIC AND ANAEROBIC Blood Culture results may not be optimal due to an inadequate volume of blood received in culture bottles   Culture   Final    NO GROWTH 5 DAYS Performed at [facility], [address], [place], [place] [address]    Report Status 04/06/2023 FINAL  Final  MRSA Next Gen by PCR, Nasal     Status: None   Collection Time: 04/02/23  5:42 AM   Specimen: Nasal Mucosa; Nasal Swab  Result Value Ref Range Status   MRSA by PCR Next Gen NOT DETECTED NOT DETECTED Final    Comment: (NOTE) The GeneXpert MRSA Assay (FDA approved for NASAL specimens only), is one component of a comprehensive MRSA colonization surveillance program. It is not intended to diagnose MRSA infection nor to guide or monitor treatment for MRSA infections. Test performance is not FDA approved in patients less than 4 years old. Performed at [facility], [address], [place], [place] [address]   Body fluid culture w Gram Stain     Status: None   Collection Time: 04/02/23  9:13 PM   Specimen: Body Fluid  Result Value Ref Range Status  Specimen Description FLUID SYNOVIAL RIGHT KNEE  Final   Special Requests NONE  Final   Gram Stain   Final     ABUNDANT WBC PRESENT, PREDOMINANTLY PMN RARE GRAM POSITIVE COCCI INTRACELLULAR Gram Stain Report Called to,Read Back By and Verified With: RN Loney Loh [phone] @ 2258 FH Performed at [facility], [address] [place] [address], [place], [place] [address]    Culture RARE STAPHYLOCOCCUS AUREUS  Final   Report Status 04/05/2023 FINAL  Final   Organism ID, Bacteria STAPHYLOCOCCUS AUREUS  Final      Susceptibility   Staphylococcus aureus - MIC*    CIPROFLOXACIN <=0.5 SENSITIVE Sensitive     ERYTHROMYCIN <=0.25 SENSITIVE Sensitive     GENTAMICIN <=0.5 SENSITIVE Sensitive     OXACILLIN <=0.25 SENSITIVE Sensitive     TETRACYCLINE <=1 SENSITIVE Sensitive     VANCOMYCIN 1 SENSITIVE Sensitive     TRIMETH/SULFA <=10 SENSITIVE Sensitive     CLINDAMYCIN <=0.25 SENSITIVE Sensitive     RIFAMPIN <=0.5 SENSITIVE Sensitive     Inducible Clindamycin NEGATIVE Sensitive     LINEZOLID 2 SENSITIVE Sensitive     * RARE STAPHYLOCOCCUS AUREUS  Culture, blood (Routine X 2) w Reflex to ID Panel     Status: None   Collection Time: 04/03/23  2:43 PM   Specimen: BLOOD LEFT HAND  Result Value Ref Range Status   Specimen Description BLOOD LEFT HAND  Final   Special Requests   Final    BOTTLES DRAWN AEROBIC AND ANAEROBIC Blood Culture adequate volume   Culture   Final    NO GROWTH 5 DAYS Performed at [facility], [address], [place], [place] [address]    Report Status 04/08/2023 FINAL  Final  Culture, blood (Routine X 2) w Reflex to ID Panel     Status: None   Collection Time: 04/03/23  2:47 PM   Specimen: BLOOD LEFT HAND  Result Value Ref Range Status   Specimen Description BLOOD LEFT HAND  Final   Special Requests   Final    BOTTLES DRAWN AEROBIC AND ANAEROBIC Blood Culture adequate volume   Culture   Final    NO GROWTH 5 DAYS Performed at [facility], [address], [place], [place] [address]    Report Status 04/08/2023 FINAL  Final  Aerobic/Anaerobic Culture w Gram Stain (surgical/deep  wound)     Status: None (Preliminary result)   Collection Time: 04/05/23 10:57 AM   Specimen: Body Fluid  Result Value Ref Range Status   Specimen Description FLUID  Final   Special Requests JOINT, LEFT SHOULDER  Final   Gram Stain   Final    MODERATE WBC PRESENT,BOTH PMN AND MONONUCLEAR NO ORGANISMS SEEN    Culture   Final    NO GROWTH 4 DAYS NO ANAEROBES ISOLATED; CULTURE IN PROGRESS FOR 5 DAYS Performed at [facility], [address], [place], [place] [address]    Report Status PENDING  Incomplete  Aerobic/Anaerobic Culture w Gram Stain (surgical/deep wound)     Status: None (Preliminary result)   Collection Time: 04/05/23 11:01 AM   Specimen: Body Fluid  Result Value Ref Range Status   Specimen Description FLUID  Final   Special Requests JOINT, RIGHT SHOULDER  Final   Gram Stain   Final    MODERATE WBC PRESENT, PREDOMINANTLY PMN FEW GRAM POSITIVE COCCI Performed at [facility], [address], [place], [place] [address]    Culture  Final    RARE STAPHYLOCOCCUS AUREUS NO ANAEROBES ISOLATED; CULTURE IN PROGRESS FOR 5 DAYS    Report Status PENDING  Incomplete   Organism ID, Bacteria STAPHYLOCOCCUS AUREUS  Final      Susceptibility   Staphylococcus aureus - MIC*    CIPROFLOXACIN <=0.5 SENSITIVE Sensitive     ERYTHROMYCIN <=0.25 SENSITIVE Sensitive     GENTAMICIN <=0.5 SENSITIVE Sensitive     OXACILLIN <=0.25 SENSITIVE Sensitive     TETRACYCLINE <=1 SENSITIVE Sensitive     VANCOMYCIN 1 SENSITIVE Sensitive     TRIMETH/SULFA <=10 SENSITIVE Sensitive     CLINDAMYCIN <=0.25 SENSITIVE Sensitive     RIFAMPIN <=0.5 SENSITIVE Sensitive     Inducible Clindamycin NEGATIVE Sensitive     LINEZOLID 2 SENSITIVE Sensitive     * RARE STAPHYLOCOCCUS AUREUS  Aerobic/Anaerobic Culture w Gram Stain (surgical/deep wound)     Status: None (Preliminary result)   Collection Time: 04/07/23 10:20 AM   Specimen: Synovial, Left Shoulder; Body Fluid  Result Value Ref Range Status    Specimen Description SYNOVIAL  Final   Special Requests NONE  Final   Gram Stain   Final    RARE WBC SEEN NO ORGANISMS SEEN Performed at [facility], [address] [place], [place] [address]    Culture   Final    FEW STAPHYLOCOCCUS AUREUS NO ANAEROBES ISOLATED; CULTURE IN PROGRESS FOR 5 DAYS    Report Status PENDING  Incomplete   Organism ID, Bacteria STAPHYLOCOCCUS AUREUS  Final      Susceptibility   Staphylococcus aureus - MIC*    CIPROFLOXACIN <=0.5 SENSITIVE Sensitive     ERYTHROMYCIN <=0.25 SENSITIVE Sensitive     GENTAMICIN <=0.5 SENSITIVE Sensitive     OXACILLIN <=0.25 SENSITIVE Sensitive     TETRACYCLINE <=1 SENSITIVE Sensitive     VANCOMYCIN 1 SENSITIVE Sensitive     TRIMETH/SULFA <=10 SENSITIVE Sensitive     CLINDAMYCIN <=0.25 SENSITIVE Sensitive     RIFAMPIN <=0.5 SENSITIVE Sensitive     Inducible Clindamycin NEGATIVE Sensitive     LINEZOLID 2 SENSITIVE Sensitive     * FEW STAPHYLOCOCCUS AUREUS     Serology:   Imaging: If present, new imagings (plain films, ct scans, and mri) have been personally visualized and interpreted; radiology reports have been reviewed. Decision making incorporated into the Impression / Recommendations.   7/8 cxr Left IJ central venous line with tip at the junction of the innominate vein and SVC. No pneumothorax.   04/03/23 tte  1. Left ventricular ejection fraction, by estimation, is 60 to 65%. The left ventricle has normal function. The left ventricle has no regional wall motion abnormalities. There is severe left ventricular hypertrophy. Left ventricular diastolic parameters   are indeterminate.   2. Right ventricular systolic function is normal. The right ventricular size is normal.   3. Left atrial size was moderately dilated.   4. Mild appearing MR but some splay artifact cannot r/o some posterior leaflet prolapse in addition to MAC with anteriorly directed MR. Given bacteremia and abnormal posterior mitral annulus  suggest TEE to further assess if clnically indicated. The mitral valve is degenerative. Mild mitral valve regurgitation. No evidence of mitral stenosis. Moderate mitral annular calcification.   5. The aortic valve is tricuspid. There is moderate calcification of the aortic valve. There is moderate thickening of the aortic valve. Aortic valve regurgitation is not visualized. Mild aortic valve stenosis.   6. The inferior vena cava is dilated in size with >50%  respiratory variability, suggesting right atrial pressure of 8 mmHg.     7/6 mri right shoulder 1. Large glenohumeral joint effusion, nonspecific. Arthrocentesis with joint fluid analysis is recommended to exclude the possibility of septic arthritis. 2. Complete full-thickness tears of the supraspinatus, infraspinatus, and subscapularis tendons with advanced rotator cuff muscle atrophy and fatty infiltration. 3. Torn and retracted long head of the biceps tendon. 4. Moderate glenohumeral and AC joint osteoarthritis.    7/5 ct right wrist 1. Patient scanned with arms down positioning which limits detailed osseous and soft tissue assessment due to soft tissue attenuation from habitus. 2. There may be a small distal radioulnar joint effusion. Suspected small radiocarpal joint effusion. This is difficult to accurately assess on the current exam. No gross CT findings of osteomyelitis. 3. Mild edema about the ulnar aspect of the wrist and distal forearm. No soft tissue collection.    7/4 mri left shoulder 1. Complete tear of the supraspinatus and infraspinatus tendons with 5.7 cm of retraction. 2. Moderate tendinosis of the subscapularis tendon with a small partial-thickness tear. 3. Complete tear of the proximal long head of the biceps tendon. 4. Moderate osteoarthritis of the glenohumeral joint. Large glenohumeral joint effusion.    7/4 mri cspine 1. Diffuse cervical spine spondylosis as described above. 2. No acute osseous injury of  the cervical spine. 3. No aggressive osseous lesion of the cervical spine. 4. At C3-4 there is a broad-based disc bulge with a small central disc protrusion. Moderate bilateral facet arthropathy. No severe bilateral foraminal stenosis. Severe spinal stenosis. 5. At C4-5 there is a broad-based disc bulge with a broad central disc protrusion. Severe spinal stenosis. Severe bilateral foraminal stenosis. 6. At C5-6 there is a mild broad-based disc bulge. Moderate right facet arthropathy. Severe bilateral foraminal stenosis. Mild spinal stenosis. 7. At C6-7 there is a mild broad-based disc bulge. Mild bilateral facet arthropathy. Severe right foraminal stenosis. Moderate left foraminal stenosis.   7/02 xray right knee Tricompartment degenerative changes identified, overall moderate greatest involving the medial compartment and patellofemoral joint.   Moderate joint effusion of uncertain etiology. This has a broad differential. This could be related to the degenerative changes. However please correlate for history of trauma or infection.   7/02 xray left knee Mild degenerative changes. No joint effusion.     Raymondo Band, MD [facility] [phone] pager    04/09/2023, 2:21 PM

## 2023-04-09 NOTE — Progress Notes (Signed)
Progress Note  Patient Name: Peter [name] Date of Encounter: 04/09/2023  Primary Cardiologist:   Rollene Rotunda, MD   Subjective   Increasing confusion.   Denies pain or SOB.   Inpatient Medications    Scheduled Meds:  (feeding supplement) PROSource Plus  30 mL Oral BID BM   apixaban  5 mg Oral BID   atorvastatin  10 mg Oral QHS   bupivacaine(PF)  10 mL Infiltration Once   Chlorhexidine Gluconate Cloth  6 each Topical Q0600   Chlorhexidine Gluconate Cloth  6 each Topical Q0600   ferric citrate  630 mg Oral TID WC   midodrine  10 mg Oral TID WC   multivitamin  1 tablet Oral QHS   pantoprazole (PROTONIX) IV  40 mg Intravenous Q12H   Continuous Infusions:  sodium chloride     anticoagulant sodium citrate     nafcillin IV Stopped (04/09/23 0424)   norepinephrine (LEVOPHED) Adult infusion 10 mcg/min (04/09/23 0700)   vasopressin 0.04 Units/min (04/09/23 0700)   PRN Meds: Place/Maintain arterial line **AND** sodium chloride, acetaminophen, alteplase, anticoagulant sodium citrate, diltiazem, heparin, heparin, [START ON 04/10/2023] heparin, lidocaine (PF), lidocaine-prilocaine, mouth rinse, oxyCODONE, pentafluoroprop-tetrafluoroeth   Vital Signs    Vitals:   04/09/23 0700 04/09/23 0800 04/09/23 0815 04/09/23 0830  BP: 122/60 (!) 116/52 (!) 124/22 (!) 123/40  Pulse: 83 90 86 90  Resp: 17 17 16 17   Temp:  97.9 F (36.6 C)    TempSrc:      SpO2: 95% 96% 97% 96%  Weight:  104 kg    Height:        Intake/Output Summary (Last 24 hours) at 04/09/2023 0836 Last data filed at 04/09/2023 0700 Gross per 24 hour  Intake 2225.08 ml  Output 1500 ml  Net 725.08 ml   Filed Weights   04/08/23 0535 04/09/23 0515 04/09/23 0800  Weight: 103.1 kg 104 kg 104 kg    Telemetry    Atrial fib with rapid rate- Personally Reviewed  ECG    NA - Personally Reviewed  Physical Exam   GEN: No  acute distress.   Neck: No  JVD Cardiac: Irregular RR, 3/6 apical systolic murmur,  no diastolic murmurs, rubs, or gallops.  Respiratory:    Decreased breath sounds. GI: Soft, nontender, non-distended, normal bowel sounds  MS:  Diffuse edema; No deformity. Neuro:   Nonfocal  Psych: Oriented and appropriate   Labs    Chemistry Recent Labs  Lab 04/07/23 1453 04/08/23 0439 04/08/23 1212 04/08/23 1701 04/09/23 0234  NA 130*  --  131* 130* 125*  K 5.5*  --  3.4* 4.0 3.9  CL 90*  --  94*  --  90*  CO2 20*  --  25  --  21*  GLUCOSE 79  --  94  --  112*  BUN 98*  --  40*  --  47*  CREATININE 8.13*  --  4.21*  --  5.07*  CALCIUM 6.3*  --  6.5*  --  6.1*  PROT 6.4* 6.5  --   --  6.8  ALBUMIN <1.5* <1.5*  --   --  <1.5*  AST 65* 57*  --   --  55*  ALT 25 19  --   --  19  ALKPHOS 65 67  --   --  106  BILITOT 11.8* 12.9*  --   --  10.4*  GFRNONAA 6*  --  14*  --  11*  ANIONGAP 20*  --  12  --  14     Hematology Recent Labs  Lab 04/07/23 2054 04/08/23 0439 04/08/23 1701 04/09/23 0234  WBC 18.9* 19.0*  --  27.0*  RBC 2.93* 2.97*  --  3.25*  HGB 9.5* 9.9* 10.2* 10.6*  HCT 28.2* 28.7* 30.0* 30.1*  MCV 96.2 96.6  --  92.6  MCH 32.4 33.3  --  32.6  MCHC 33.7 34.5  --  35.2  RDW 15.5 15.7*  --  15.7*  PLT 196 209  --  211    Cardiac EnzymesNo results for input(s): "TROPONINI" in the last 168 hours. No results for input(s): "TROPIPOC" in the last 168 hours.   BNP Recent Labs  Lab 04/04/23 0441 04/05/23 0315 04/06/23 0511  BNP 112.2* 142.3* 183.4*     DDimer No results for input(s): "DDIMER" in the last 168 hours.   Radiology    DG CHEST PORT 1 VIEW  Result Date: 04/07/2023 CLINICAL DATA:  Central line placement. EXAM: PORTABLE CHEST 1 VIEW COMPARISON:  Chest radiograph dated 04/02/2023. FINDINGS: Left IJ central venous line with tip at the junction of the innominate vein and SVC. Shallow inspiration with bibasilar atelectasis. No focal consolidation, pleural effusion or pneumothorax. Stable cardiomegaly. Atherosclerotic calcification of the aorta. No  acute osseous pathology. Degenerative changes of the spine and shoulders. IMPRESSION: Left IJ central venous line with tip at the junction of the innominate vein and SVC. No pneumothorax. Electronically Signed   By: Elgie Collard M.D.   On: 04/07/2023 15:29    Cardiac Studies   Echo:  1. Left ventricular ejection fraction, by estimation, is 60 to 65%. The  left ventricle has normal function. The left ventricle has no regional  wall motion abnormalities. There is severe left ventricular hypertrophy.  Left ventricular diastolic parameters   are indeterminate.   2. Right ventricular systolic function is normal. The right ventricular  size is normal.   3. Left atrial size was moderately dilated.   4. Mild appearing MR but some splay artifact cannot r/o some posterior  leaflet prolapse in addition to MAC with anteriorly directed MR Given  bacteremia and abnormal posterior mitral annulus suggest TEE to further  assess if clnically indicated. The  mitral valve is degenerative. Mild mitral valve regurgitation. No evidence  of mitral stenosis. Moderate mitral annular calcification.   5. The aortic valve is tricuspid. There is moderate calcification of the  aortic valve. There is moderate thickening of the aortic valve. Aortic  valve regurgitation is not visualized. Mild aortic valve stenosis.   6. The inferior vena cava is dilated in size with >50% respiratory  variability, suggesting right atrial pressure of 8 mmHg.   Patient Profile     72 y.o. male with a hx of ESRD on HD MWF, type 2 dM, anemia of CKD, HTN, HLD, Mild MR who is being seen 04/04/2023 for the evaluation of atrial fibrillation, possible endocarditis at the request of Dr. Thedore Mins    Assessment & Plan    MSSA bacteremia/septic arthritis:  Possible endocarditis by current criteria.   TEE could confirm definite but currently on  hold .  He would be high risk for surgery.  However, surgery would be indicated for his MR if  endocarditis is confirmed and if he is thought to have persistent infection, large vegetations (not seen on TTE) or complications like acute CHF.   TEE would be difficult given his current condition and I will talk with my imaging partners, the patient and his  family.  Confirmation of endocarditis could assist with goals of therapy discussion including whether to consider high risk surgery.   Atrial fib:    Eliquis resumed.  I will put this on hold if there is consideration of any invasive procedures.   MR:  As above.   Sepsis:  Per CCM.  He is requiring higher dose pressors.  WBC increased.     For questions or updates, please contact [facility] Please consult [email or web address] for contact info under Cardiology/STEMI.   Signed, Rollene Rotunda, MD  04/09/2023, 8:36 AM

## 2023-04-09 NOTE — Progress Notes (Signed)
   04/09/23 1119  Vitals  Pulse Rate 96  Resp (!) 24  BP (!) 124/54  SpO2 93 %  Post Treatment  Dialyzer Clearance Clear  Duration of HD Treatment -hour(s) 3 hour(s)  Hemodialysis Intake (mL) 0 mL  Liters Processed 72  Fluid Removed (mL) 1500 mL  Tolerated HD Treatment Yes  AVG/AVF Arterial Site Held (minutes) 8 minutes  AVG/AVF Venous Site Held (minutes) 8 minutes   Received patient in bed to unit.  Alert and oriented.  Informed consent signed and in chart.   TX duration:3.0hrs  Patient tolerated well.  Transported back to the room  Alert, without acute distress.  Hand-off given to patient's nurse.   Access used: RUA AVF Access issues: none  Total UF removed: 1.5L Medication(s) given: none    [facility]

## 2023-04-09 NOTE — Procedures (Signed)
Arterial Line Insertion Start/End7/07/2023 1:30 AM, 04/09/2023 1:40 AM  Patient location: ICU. Preanesthetic checklist: patient identified, risks and benefits discussed, surgical consent, monitors and equipment checked and timeout performed Left, radial was placed Catheter size: 20 G Hand hygiene performed , maximum sterile barriers used  and Seldinger technique used Allen's test indicative of satisfactory collateral circulation Attempts: 1 Procedure performed without using ultrasound guided technique. Following insertion, Biopatch and dressing applied. Post procedure assessment: normal and unchanged  Patient tolerated the procedure well with no immediate complications.

## 2023-04-09 NOTE — Plan of Care (Signed)
IR was requested for image guided AVF fistulogram due to low arterial pressures.    Procedure has been on hold due to patient's hypotension, he has been requiring levophed.   Patient underwent HD today with no issues. Discussed with Dr. Arlean Hopping, RF stable, OK to cancel the fistulogram for now. Nephrology will re- order fistulogram if needed.     Will delete the fistulogram order.  Please call IR for questions and concerns.   Lynann Bologna Chabeli Barsamian PA-C 04/09/2023 2:20 PM

## 2023-04-09 NOTE — Progress Notes (Signed)
NAME:  Peter [name], MRN:  [id number], DOB:  [birth year], LOS: 8 ADMISSION DATE:  04/01/2023, CONSULTATION DATE:  04/01/2023 REFERRING MD:  Dr. Thedore Mins - [facility], CHIEF COMPLAINT:   weakness/ fall/ NV   History of Present Illness:  43 yoM with PMH as below, significant for ESRD on MWF iHD via RUE AVF, afib on Eliquis, T2DM, anemia of CKD, HTN, HL, and OSA not on CPAP who presented with weakness from home. Started not to feel well on Sunday, 6/30 with nausea and vomiting. Sustained a fall, onto right knee and left shoulder pain. Admitted to [facility] on 7/2 for further workup with suspected sepsis, febrile 102.3 on admit. Blood cultures positive for MSSA bacteremia, source unclear at this time, being treated additionally for aspiration pneumonia, and mild transaminitis. Nephrology, ID, GI, and palliative care following. Ortho consulted and underwent aspiration of right knee showing many WBC, primarily PMNs. He underwent iHD 7/3 with 1.4L removed 7/3. TTE pending. Overnight, had intermittent low blood pressure episodes and patient is more sonolucent. Given concern for worsening sepsis, PCCM consulted.   Pertinent  Medical History  ESRD on MWF iHD via RUE AVF, afib on Eliquis, T2DM, anemia of CKD, HTN, HL, and OSA not on CPAP   Significant Hospital Events: Including procedures, antibiotic start and stop dates in addition to other pertinent events   7/2 admitted  7/3 iHD, right knee joint asp 7/8 to OR for I&D of right knee and bilateral shoulders.  7/9 iHD, eliquis restarted   Interim History / Subjective:  Not eating/ drinking well  Hypotensive with SBP 70's overnight with mental status changes, aline place to verify cuff pressures, albumin and vaso added.  NE up to 25 now back down to 10.   S/p iHD this am, UF 1.5L  Afebrile, WBC 19> 27 RN concerned for bloody stool today x 1  Objective   Blood pressure (!) 123/40, pulse 90, temperature 97.9 F (36.6 C), resp. rate 17, height 5\' 6"  (1.676 m), weight  104 kg, SpO2 96 %. CVP:  [7 mmHg] 7 mmHg      Intake/Output Summary (Last 24 hours) at 04/09/2023 0837 Last data filed at 04/09/2023 0700 Gross per 24 hour  Intake 2225.08 ml  Output 1500 ml  Net 725.08 ml   Filed Weights   04/08/23 0535 04/09/23 0515 04/09/23 0800  Weight: 103.1 kg 104 kg 104 kg    Examination:  General:  chronically ill appearing elderly male sitting upright in bed in NAD HEENT: MM pale, dry, pupils 4/r, anicteric  Neuro: sleepy, arouses to verbal, oriented to person, time, not place, f/c simple commands  CV: irir, afib w/ PVCs, +murmur PULM:  non labored, clear diminished in bases, room air, no wheeze GI: soft, bs+, NT, ND   Extremities: warm/dry, generalized in UE edema, dressing R shoulder, L shoulder, R knee  Skin: healing abrasions to knees  Labs reviewed> Na 125, K 3.9, t. 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  Appreciate ortho assistance  - ID following, appreciate input.  ABX per ID - Cardiology following, plans for TEE 7/11.  NPO after midnight.  Eliquis held as below - PMT following.  Pt and significant other confirm, remains full code currently but patient would not prolonged life support, trach, feeding tube, etc.    Aspiration PNA OSA- untreated  P: - prn O2 for sat goal > 92% - aspiration precautions - ongoing pulmonary hygiene   ESRD on IHD MWF Anion gap  metabolic acidosis, resolved P: - per Nephrology  - s/p iHD 7/10, 1.5L UF  PAF  P: - remains in afib, rates controlled - holding eliquis with concern for GIB as below.  If H/H stable, consider heparin gtt restarting - optimize electrolytes, K, Mag, iCa  Hyponatremia Hyperkalemia Hypocalcemia  P: - replete per Nephrology, trending BMET - s/p calcium replete 7/10  Protein calorie malnutrition Hypoalbuminemia Physical deconditioning Fall -Albumin less than 1.5 P: - poor PO intake/ nutrition.  Would not want a feeding tube/ cortrak, ever.  RD consult to optimize nutrition - PT when able  R/o GIB P:  - dark bloody BM 7/10 - PPI BID - trend H/H q6 - hold eliquis  - keep T&S updated   Metabolic encephalopathy - more confused in am's - delirium precautions - supportive care as above    Best Practice (right click and "Reselect all SmartList Selections" daily)   Diet/type: Regular consistency (see orders) DVT prophylaxis: SCD GI prophylaxis: PPI Lines: Central line Foley:  N/A Code Status:  full code Last date of multidisciplinary goals of care discussion: Full code with all aggressive interventions per patient   Patient and s/o, [name] updated at bedside.   Labs   CBC: Recent Labs  Lab 04/05/23 0315 04/06/23 0511 04/07/23 0701 04/07/23 1056 04/07/23 1453 04/07/23 2054 04/08/23 0439 04/08/23 1701 04/09/23 0234  WBC 10.3 15.1* 16.8*  --  19.5* 18.9* 19.0*  --  27.0*  NEUTROABS 7.3 11.6* 12.9*  --   --   --  16.2*  --  25.4*  HGB 12.4* 12.6* 10.8*   < > 10.6* 9.5* 9.9* 10.2* 10.6*  HCT 35.4* 35.0* 30.7*   < > 31.2* 28.2* 28.7* 30.0* 30.1*  MCV 95.4 94.1 92.7  --  95.4 96.2 96.6  --  92.6  PLT 130* 185 205  --  204 196 209  --  211   < > = values in this interval not displayed.    Basic Metabolic Panel: Recent Labs  Lab 04/03/23 2115 04/04/23 0441 04/06/23 0815 04/07/23 0701 04/07/23 1056 04/07/23 1453 04/08/23 1212 04/08/23 1701 04/09/23 0234   NA 130* 127* 129* 128* 123* 130* 131* 130* 125*  K 4.4 4.1 5.3* 5.1 5.5* 5.5* 3.4* 4.0 3.9  CL 87* 89* 90* 91* 93* 90* 94*  --  90*  CO2 25 24 21* 21*  --  20* 25  --  21*  GLUCOSE 143* 108* 64* 115* 79 79 94  --  112*  BUN 68* 77* 77* 96* 85* 98* 40*  --  47*  CREATININE 7.58* 8.18* 7.17* 8.02* 7.70* 8.13* 4.21*  --  5.07*  CALCIUM 8.7* 8.2* 7.3* 6.2*  --  6.3* 6.5*  --  6.1*  MG 2.2 2.3  --   --   --  2.5* 1.8  --  2.3  PHOS  --   --   --   --   --  11.3*  --   --   --  GFR: Estimated Creatinine Clearance: 14.4 mL/min (A) (by C-G formula based on SCr of 5.07 mg/dL (H)). Recent Labs  Lab 04/06/23 0511 04/07/23 0701 04/07/23 1453 04/07/23 2054 04/08/23 0439 04/09/23 0234  PROCALCITON 18.04 13.17  --   --  10.12 6.64  WBC 15.1* 16.8* 19.5* 18.9* 19.0* 27.0*    Liver Function Tests: Recent Labs  Lab 04/06/23 0815 04/07/23 0701 04/07/23 1453 04/08/23 0439 04/09/23 0234  AST 89* 64* 65* 57* 55*  ALT 40 29 25 19 19   ALKPHOS 80 68 65 67 106  BILITOT 9.8* 10.3* 11.8* 12.9* 10.4*  PROT 6.6 6.1* 6.4* 6.5 6.8  ALBUMIN <1.5* <1.5* <1.5* <1.5* <1.5*   No results for input(s): "LIPASE", "AMYLASE" in the last 168 hours. Recent Labs  Lab 04/03/23 1058  AMMONIA 20    ABG    Component Value Date/Time   HCO3 25.1 04/08/2023 1701   TCO2 26 04/08/2023 1701   O2SAT 68 04/08/2023 1701     Coagulation Profile: Recent Labs  Lab 04/06/23 0511  INR 1.7*    Cardiac Enzymes: No results for input(s): "CKTOTAL", "CKMB", "CKMBINDEX", "TROPONINI" in the last 168 hours.  HbA1C: Hgb A1c MFr Bld  Date/Time Value Ref Range Status  05/30/2018 02:49 AM 5.7 (H) 4.8 - 5.6 % Final    Comment:    (NOTE) Pre diabetes:          5.7%-6.4% Diabetes:              >6.4% Glycemic control for   <7.0% adults with diabetes     CBG: Recent Labs  Lab 04/08/23 0628 04/08/23 1331 04/08/23 1848 04/08/23 2343 04/09/23 0636  GLUCAP 85 91 94 105* 100*    Critical care time: 35 min      Posey Boyer, MSN, NP, AG-ACNP-BC  Pulmonary & Critical Care 04/09/2023, 8:38 AM  See Amion for pager If no response to pager , please call [phone] until 7pm After 7:00 pm call Elink  [phone]

## 2023-04-09 NOTE — Progress Notes (Addendum)
[facility] Progress Note   Subjective: seen in ICU. Pt confused today. UF goal lowered due to BP drops on HD this am.  Total UF was 1.5L today.   Objective Vitals:   04/07/23 1415 04/07/23 1430 04/07/23 1445 04/07/23 1500  BP: (!) 105/55 (!) 107/50 114/71 (!) 92/51  Pulse: 77 84 93 72  Resp: 20 19 18 18   Temp:      TempSrc:      SpO2: 94% 95% (!) 88% 97%  Weight:      Height:       Physical Exam General: Ill appearing man, post op is drowsy Heart:Irregularly irregular Lungs: CTA anteriorly, poor effort Abdomen: soft Extremities: 1+ BLE edema; 1+ RUE edema Dialysis Access: R AVG + bruit   Home meds - eliquis, lipitor, auryxia 3 ac tid, prns   Dialysis Orders:  MWF [name] 4hr  450/800     97.5kg    3K/2Ca   AVG   15g   Heparin 10,000 bolus - no ESA, Hgb > 12 - No VDRA (recently stopped d/t Ca 10.9)   Assessment/ Plan Sepsis/ aspiration pneumonia / septic joints: Currently on antibiotics. Per ID/ primary team. MSSA bacteremia: 1st set Blood Cx 7/2 positive w/ aspirate from knee also + for staph aureus.  AVG without signs of infection. ID following, remains on nafcillin. Plan for TEE.  Hypotension - on midodrine 10 tid po and IV levo gtt up to 10 micrograms/ min today.  Shoulder/Knee effusion - SP washout/ I&D of bilat shoulder and R knee 7/08 per ortho ESRD: HD is MWF. Had HD Monday night and today this morning. Next HD Friday.  Volume: 5- 6 kg up by wts, +LE / UE edema, soft bp's on ^'d ing dose of pressors. 3rd spacing, CVP is stable 4-10 range.  HD access: low arterial pressures when dialyzed yesterday. High K+ issue has resolved.  Consulted IR for fistulogram when he is deemed stable enough.  Anemia of CKD: Hemoglobin above goal.  No need for ESA. CKD-MBD: Calcium low --> sensipar dc'd for now. Got 2gm IV Ca. Phos high, will cont binders.  Elevated LFTs - tbili up to 12, AST/ALT okay.  A-Fib  [name]  MD  [facility] 04/09/2023, 11:23 AM  Recent  Labs  Lab 04/07/23 1453 04/07/23 2054 04/08/23 0439 04/08/23 1212 04/08/23 1701 04/09/23 0234  HGB 10.6*   < > 9.9*  --  10.2* 10.6*  ALBUMIN <1.5*  --  <1.5*  --   --  <1.5*  CALCIUM 6.3*  --   --  6.5*  --  6.1*  PHOS 11.3*  --   --   --   --   --   CREATININE 8.13*  --   --  4.21*  --  5.07*  K 5.5*  --   --  3.4* 4.0 3.9   < > = values in this interval not displayed.     Inpatient medications:  (feeding supplement) PROSource Plus  30 mL Oral BID BM   apixaban  5 mg Oral BID   atorvastatin  10 mg Oral QHS   Chlorhexidine Gluconate Cloth  6 each Topical Q0600   Chlorhexidine Gluconate Cloth  6 each Topical Q0600   ferric citrate  630 mg Oral TID WC   midodrine  15 mg Oral TID WC   multivitamin  1 tablet Oral QHS   pantoprazole (PROTONIX) IV  40 mg Intravenous Q12H    sodium chloride  anticoagulant sodium citrate     nafcillin IV Stopped (04/09/23 0926)   norepinephrine (LEVOPHED) Adult infusion 10 mcg/min (04/09/23 1108)   vasopressin 0.04 Units/min (04/09/23 1030)   Place/Maintain arterial line **AND** sodium chloride, acetaminophen, alteplase, anticoagulant sodium citrate, diltiazem, heparin, heparin, [START ON 04/10/2023] heparin, lidocaine (PF), lidocaine-prilocaine, mouth rinse, oxyCODONE, pentafluoroprop-tetrafluoroeth

## 2023-04-09 NOTE — Progress Notes (Signed)
eLink Physician-Brief Progress Note Patient Name: Peter [name] DOB: 02-Feb-1951 MRN: [id number]   Date of Service  04/09/2023  HPI/Events of Note  Increased ventricular ectopy and nonsustained VT, rates less than 100  eICU Interventions  Add on BMP/mag/Phos for next lab check.   29 -patient is currently in MRI and having difficulty laying still.  Will add on Ativan 1 mg IV push up to 2 doses as needed.  Intervention Category Intermediate Interventions: Arrhythmia - evaluation and management  [name] 04/09/2023, 11:15 PM

## 2023-04-10 ENCOUNTER — Encounter (HOSPITAL_COMMUNITY): Admission: EM | Disposition: E | Payer: Self-pay | Source: Home / Self Care | Attending: Pulmonary Disease

## 2023-04-10 ENCOUNTER — Inpatient Hospital Stay (HOSPITAL_COMMUNITY): Payer: Medicare Other

## 2023-04-10 ENCOUNTER — Inpatient Hospital Stay (HOSPITAL_COMMUNITY): Payer: Medicare Other | Admitting: Certified Registered Nurse Anesthetist

## 2023-04-10 DIAGNOSIS — M25462 Effusion, left knee: Secondary | ICD-10-CM | POA: Diagnosis not present

## 2023-04-10 DIAGNOSIS — N186 End stage renal disease: Secondary | ICD-10-CM | POA: Diagnosis not present

## 2023-04-10 DIAGNOSIS — I38 Endocarditis, valve unspecified: Secondary | ICD-10-CM

## 2023-04-10 DIAGNOSIS — I120 Hypertensive chronic kidney disease with stage 5 chronic kidney disease or end stage renal disease: Secondary | ICD-10-CM | POA: Diagnosis not present

## 2023-04-10 DIAGNOSIS — M00061 Staphylococcal arthritis, right knee: Secondary | ICD-10-CM | POA: Diagnosis not present

## 2023-04-10 DIAGNOSIS — E1122 Type 2 diabetes mellitus with diabetic chronic kidney disease: Secondary | ICD-10-CM

## 2023-04-10 DIAGNOSIS — Z992 Dependence on renal dialysis: Secondary | ICD-10-CM

## 2023-04-10 DIAGNOSIS — I340 Nonrheumatic mitral (valve) insufficiency: Secondary | ICD-10-CM

## 2023-04-10 DIAGNOSIS — A4101 Sepsis due to Methicillin susceptible Staphylococcus aureus: Secondary | ICD-10-CM | POA: Diagnosis not present

## 2023-04-10 DIAGNOSIS — R7881 Bacteremia: Secondary | ICD-10-CM

## 2023-04-10 DIAGNOSIS — B9561 Methicillin susceptible Staphylococcus aureus infection as the cause of diseases classified elsewhere: Secondary | ICD-10-CM | POA: Diagnosis not present

## 2023-04-10 DIAGNOSIS — M25431 Effusion, right wrist: Secondary | ICD-10-CM | POA: Diagnosis not present

## 2023-04-10 HISTORY — PX: TEE WITHOUT CARDIOVERSION: SHX5443

## 2023-04-10 LAB — CBC
HCT: 27 % — ABNORMAL LOW (ref 39.0–52.0)
Hemoglobin: 9.4 g/dL — ABNORMAL LOW (ref 13.0–17.0)
MCH: 32.2 pg (ref 26.0–34.0)
MCHC: 34.8 g/dL (ref 30.0–36.0)
MCV: 92.5 fL (ref 80.0–100.0)
Platelets: 172 10*3/uL (ref 150–400)
RBC: 2.92 MIL/uL — ABNORMAL LOW (ref 4.22–5.81)
RDW: 15.7 % — ABNORMAL HIGH (ref 11.5–15.5)
WBC: 15.5 10*3/uL — ABNORMAL HIGH (ref 4.0–10.5)
nRBC: 0.1 % (ref 0.0–0.2)

## 2023-04-10 LAB — BASIC METABOLIC PANEL
Anion gap: 18 — ABNORMAL HIGH (ref 5–15)
Anion gap: 17 — ABNORMAL HIGH (ref 5–15)
BUN: 37 mg/dL — ABNORMAL HIGH (ref 8–23)
BUN: 42 mg/dL — ABNORMAL HIGH (ref 8–23)
CO2: 22 mmol/L (ref 22–32)
CO2: 21 mmol/L — ABNORMAL LOW (ref 22–32)
Calcium: 6.7 mg/dL — ABNORMAL LOW (ref 8.9–10.3)
Calcium: 6.8 mg/dL — ABNORMAL LOW (ref 8.9–10.3)
Chloride: 90 mmol/L — ABNORMAL LOW (ref 98–111)
Chloride: 92 mmol/L — ABNORMAL LOW (ref 98–111)
Creatinine, Ser: 4.49 mg/dL — ABNORMAL HIGH (ref 0.61–1.24)
Creatinine, Ser: 5.04 mg/dL — ABNORMAL HIGH (ref 0.61–1.24)
GFR, Estimated: 13 mL/min — ABNORMAL LOW (ref >60)
GFR, Estimated: 11 mL/min — ABNORMAL LOW (ref >60)
Glucose, Bld: 163 mg/dL — ABNORMAL HIGH (ref 70–99)
Glucose, Bld: 164 mg/dL — ABNORMAL HIGH (ref 70–99)
Potassium: 3.4 mmol/L — ABNORMAL LOW (ref 3.5–5.1)
Potassium: 3.7 mmol/L (ref 3.5–5.1)
Sodium: 130 mmol/L — ABNORMAL LOW (ref 135–145)
Sodium: 130 mmol/L — ABNORMAL LOW (ref 135–145)

## 2023-04-10 LAB — HEMOGLOBIN AND HEMATOCRIT, BLOOD
HCT: 26.4 % — ABNORMAL LOW (ref 39.0–52.0)
Hemoglobin: 9.4 g/dL — ABNORMAL LOW (ref 13.0–17.0)

## 2023-04-10 LAB — AEROBIC/ANAEROBIC CULTURE W GRAM STAIN (SURGICAL/DEEP WOUND)

## 2023-04-10 LAB — RENAL FUNCTION PANEL
Albumin: <1.5 g/dL — ABNORMAL LOW (ref 3.5–5.0)
Anion gap: 20 — ABNORMAL HIGH (ref 5–15)
BUN: 37 mg/dL — ABNORMAL HIGH (ref 8–23)
CO2: 22 mmol/L (ref 22–32)
Calcium: 6.8 mg/dL — ABNORMAL LOW (ref 8.9–10.3)
Chloride: 88 mmol/L — ABNORMAL LOW (ref 98–111)
Creatinine, Ser: 4.54 mg/dL — ABNORMAL HIGH (ref 0.61–1.24)
GFR, Estimated: 13 mL/min — ABNORMAL LOW (ref >60)
Glucose, Bld: 162 mg/dL — ABNORMAL HIGH (ref 70–99)
Phosphorus: 7 mg/dL — ABNORMAL HIGH (ref 2.5–4.6)
Potassium: 3.4 mmol/L — ABNORMAL LOW (ref 3.5–5.1)
Sodium: 130 mmol/L — ABNORMAL LOW (ref 135–145)

## 2023-04-10 LAB — HEPARIN LEVEL (UNFRACTIONATED): Heparin Unfractionated: 0.32 IU/mL (ref 0.30–0.70)

## 2023-04-10 LAB — GLUCOSE, CAPILLARY
Glucose-Capillary: 104 mg/dL — ABNORMAL HIGH (ref 70–99)
Glucose-Capillary: 107 mg/dL — ABNORMAL HIGH (ref 70–99)
Glucose-Capillary: 116 mg/dL — ABNORMAL HIGH (ref 70–99)
Glucose-Capillary: 141 mg/dL — ABNORMAL HIGH (ref 70–99)
Glucose-Capillary: 165 mg/dL — ABNORMAL HIGH (ref 70–99)
Glucose-Capillary: 184 mg/dL — ABNORMAL HIGH (ref 70–99)
Glucose-Capillary: 195 mg/dL — ABNORMAL HIGH (ref 70–99)

## 2023-04-10 LAB — ECHO TEE

## 2023-04-10 LAB — MAGNESIUM: Magnesium: 2.1 mg/dL (ref 1.7–2.4)

## 2023-04-10 LAB — CALCIUM, IONIZED: Calcium, Ionized, Serum: 3.2 mg/dL — ABNORMAL LOW (ref 4.5–5.6)

## 2023-04-10 LAB — PROTIME-INR
INR: 2.2 — ABNORMAL HIGH (ref 0.8–1.2)
Prothrombin Time: 24.6 s — ABNORMAL HIGH (ref 11.4–15.2)

## 2023-04-10 SURGERY — ECHOCARDIOGRAM, TRANSESOPHAGEAL
Anesthesia: Monitor Anesthesia Care

## 2023-04-10 MED ORDER — CEFAZOLIN SODIUM-DEXTROSE 1-4 GM/50ML-% IV SOLN
1.0000 g | INTRAVENOUS | Status: DC
  Administered 2023-04-10 – 2023-04-13 (×4): 1 g via INTRAVENOUS
  Filled 2023-04-10 (×4): qty 50

## 2023-04-10 MED ORDER — PROPOFOL 10 MG/ML IV BOLUS
INTRAVENOUS | Status: DC | PRN
Start: 2023-04-10 — End: 2023-04-10
  Administered 2023-04-10: 20 mg via INTRAVENOUS
  Administered 2023-04-10 (×2): 10 mg via INTRAVENOUS

## 2023-04-10 MED ORDER — CHLORHEXIDINE GLUCONATE CLOTH 2 % EX PADS
6.0000 | MEDICATED_PAD | Freq: Every day | CUTANEOUS | Status: DC
  Administered 2023-04-12: 6 via TOPICAL

## 2023-04-10 MED ORDER — LORAZEPAM 2 MG/ML IJ SOLN: 1.0000 mg | INTRAMUSCULAR | Status: DC | PRN

## 2023-04-10 MED ORDER — POTASSIUM CHLORIDE CRYS ER 20 MEQ PO TBCR
20.0000 meq | EXTENDED_RELEASE_TABLET | Freq: Once | ORAL | Status: AC
  Administered 2023-04-10: 20 meq via ORAL
  Filled 2023-04-10: qty 1

## 2023-04-10 MED ORDER — CALCIUM GLUCONATE-NACL 1-0.675 GM/50ML-% IV SOLN
1.0000 g | Freq: Once | INTRAVENOUS | Status: AC
  Administered 2023-04-10: 1000 mg via INTRAVENOUS
  Filled 2023-04-10: qty 50

## 2023-04-10 MED ORDER — INSULIN ASPART 100 UNIT/ML IJ SOLN
0.0000 [IU] | INTRAMUSCULAR | Status: DC
  Administered 2023-04-10: 1 [IU] via SUBCUTANEOUS
  Administered 2023-04-10: 2 [IU] via SUBCUTANEOUS
  Administered 2023-04-11 – 2023-04-12 (×4): 1 [IU] via SUBCUTANEOUS
  Administered 2023-04-12 (×3): 2 [IU] via SUBCUTANEOUS
  Administered 2023-04-13: 1 [IU] via SUBCUTANEOUS
  Administered 2023-04-13 (×3): 2 [IU] via SUBCUTANEOUS
  Administered 2023-04-14 – 2023-04-18 (×16): 1 [IU] via SUBCUTANEOUS

## 2023-04-10 MED ORDER — ETOMIDATE 2 MG/ML IV SOLN
INTRAVENOUS | Status: DC | PRN
  Administered 2023-04-10: 4 mg via INTRAVENOUS
  Administered 2023-04-10 (×2): 2 mg via INTRAVENOUS

## 2023-04-10 MED ORDER — HEPARIN (PORCINE) 25000 UT/250ML-% IV SOLN
1000.0000 [IU]/h | INTRAVENOUS | Status: DC
  Administered 2023-04-10: 1000 [IU]/h via INTRAVENOUS
  Filled 2023-04-10: qty 250

## 2023-04-10 MED ORDER — SODIUM CHLORIDE 0.9 % IV SOLN: INTRAVENOUS | Status: DC

## 2023-04-10 NOTE — Progress Notes (Addendum)
Progress Note  Patient Name: Peter [name] Date of Encounter: 04/10/2023  Primary Cardiologist:   Rollene Rotunda, MD   Subjective   Answers questions.  Denies chest pain or SOB  Inpatient Medications    Scheduled Meds:  (feeding supplement) PROSource Plus  30 mL Oral BID BM   atorvastatin  10 mg Oral QHS   Chlorhexidine Gluconate Cloth  6 each Topical Q0600   Chlorhexidine Gluconate Cloth  6 each Topical Q0600   feeding supplement  237 mL Oral TID BM   ferric citrate  630 mg Oral TID WC   insulin aspart  0-9 Units Subcutaneous Q4H   midodrine  15 mg Oral TID WC   multivitamin  1 tablet Oral QHS   pantoprazole (PROTONIX) IV  40 mg Intravenous Q12H   Continuous Infusions:  sodium chloride     sodium chloride     anticoagulant sodium citrate     calcium gluconate 1,000 mg (04/10/23 0859)   nafcillin IV Stopped (04/10/23 0616)   norepinephrine (LEVOPHED) Adult infusion 8 mcg/min (04/10/23 0700)   vasopressin 0.02 Units/min (04/10/23 0733)   PRN Meds: Place/Maintain arterial line **AND** sodium chloride, acetaminophen, alteplase, anticoagulant sodium citrate, diltiazem, heparin, heparin, heparin, lidocaine (PF), lidocaine-prilocaine, LORazepam, mouth rinse, oxyCODONE, pentafluoroprop-tetrafluoroeth   Vital Signs    Vitals:   04/10/23 0615 04/10/23 0630 04/10/23 0645 04/10/23 0700  BP: (!) 116/42 113/61 (!) 113/54 (!) 104/53  Pulse: (!) 103 94 82   Resp: 19 (!) 25 19 18   Temp:    (!) 97.5 F (36.4 C)  TempSrc:    Oral  SpO2: 94% 94% 94% 95%  Weight:      Height:        Intake/Output Summary (Last 24 hours) at 04/10/2023 0946 Last data filed at 04/10/2023 0700 Gross per 24 hour  Intake 1407.41 ml  Output 1500 ml  Net -92.59 ml   Filed Weights   04/09/23 0515 04/09/23 0800 04/10/23 0600  Weight: 104 kg 104 kg 104.3 kg    Telemetry    Atrial fib, NSVT - Personally Reviewed  ECG    NA - Personally Reviewed  Physical Exam   GEN:   Acutely ill.     Neck: No  JVD Cardiac: Irregular RR, apical systolic murmur, no diastolic murmurs, rubs, or gallops.  Respiratory:    Decreased breath sounds. GI: Soft, nontender, non-distended, normal bowel sounds  MS:  Diffuse edema; No deformity. Neuro:   Nonfocal   Labs    Chemistry Recent Labs  Lab 04/07/23 1453 04/08/23 0439 04/08/23 1212 04/09/23 0234 04/09/23 2323 04/10/23 0549  NA 130*  --    < > 125* 130* 130*  130*  K 5.5*  --    < > 3.9 3.5 3.4*  3.4*  CL 90*  --    < > 90* 90* 88*  90*  CO2 20*  --    < > 21* 21* 22  22  GLUCOSE 79  --    < > 112* 178* 162*  163*  BUN 98*  --    < > 47* 34* 37*  37*  CREATININE 8.13*  --    < > 5.07* 4.12* 4.54*  4.49*  CALCIUM 6.3*  --    < > 6.1* 6.9* 6.8*  6.7*  PROT 6.4* 6.5  --  6.8  --   --   ALBUMIN <1.5* <1.5*  --  <1.5*  --  <1.5*  AST 65* 57*  --  55*  --   --   ALT 25 19  --  19  --   --   ALKPHOS 65 67  --  106  --   --   BILITOT 11.8* 12.9*  --  10.4*  --   --   GFRNONAA 6*  --    < > 11* 14* 13*  13*  ANIONGAP 20*  --    < > 14 19* 20*  18*   < > = values in this interval not displayed.     Hematology Recent Labs  Lab 04/08/23 0439 04/08/23 1701 04/09/23 0234 04/09/23 1319 04/09/23 1755 04/09/23 2323 04/10/23 0549  WBC 19.0*  --  27.0*  --   --   --  15.5*  RBC 2.97*  --  3.25*  --   --   --  2.92*  HGB 9.9*   < > 10.6*   < > 9.8* 9.6* 9.4*  HCT 28.7*   < > 30.1*   < > 26.8* 27.3* 27.0*  MCV 96.6  --  92.6  --   --   --  92.5  MCH 33.3  --  32.6  --   --   --  32.2  MCHC 34.5  --  35.2  --   --   --  34.8  RDW 15.7*  --  15.7*  --   --   --  15.7*  PLT 209  --  211  --   --   --  172   < > = values in this interval not displayed.    Cardiac EnzymesNo results for input(s): "TROPONINI" in the last 168 hours. No results for input(s): "TROPIPOC" in the last 168 hours.   BNP Recent Labs  Lab 04/04/23 0441 04/05/23 0315 04/06/23 0511  BNP 112.2* 142.3* 183.4*     DDimer No results for input(s):  "DDIMER" in the last 168 hours.   Radiology    MR BRAIN WO CONTRAST  Result Date: 04/10/2023 CLINICAL DATA:  72 year old male with staph aureus bacteremia. Confusion and altered mental status. Query CNS infection. EXAM: MRI HEAD WITHOUT CONTRAST TECHNIQUE: Multiplanar, multiecho pulse sequences of the brain and surrounding structures were obtained without intravenous contrast. COMPARISON:  Head CT 03/31/2023. Cervical spine MRI 04/03/2023. FINDINGS: Study is intermittently degraded by motion artifact despite repeated imaging attempts. Brain: No restricted diffusion to suggest acute infarction. No midline shift, mass effect, evidence of mass lesion, ventriculomegaly, extra-axial collection or acute intracranial hemorrhage. Cervicomedullary junction and pituitary are within normal limits. No evidence of intraventricular debris. No evidence of chronic cerebral blood products or microhemorrhage on SWI. Other axial imaging demonstrates normal for age gray and white matter signal throughout the brain. No cerebral edema. No cortical encephalomalacia. Deep gray nuclei, brainstem and cerebellum appear negative. Vascular: Major intracranial vascular flow voids are preserved. Skull and upper cervical spine: Cervical spine detail today is obscured by motion. Visualized bone marrow signal is within normal limits. Sinuses/Orbits: Postoperative changes to both globes. Scattered mild or at most moderate paranasal sinus mucosal thickening. No sinus fluid levels. Other: Trace right mastoid fluid. Grossly normal visible internal auditory structures. IMPRESSION: Negative for age noncontrast MRI appearance of the Brain when allowing for motion artifact. Electronically Signed   By: Odessa Fleming M.D.   On: 04/10/2023 05:15    Cardiac Studies   Echo:  1. Left ventricular ejection fraction, by estimation, is 60 to 65%. The  left ventricle has normal function. The left ventricle  has no regional  wall motion abnormalities. There is  severe left ventricular hypertrophy.  Left ventricular diastolic parameters   are indeterminate.   2. Right ventricular systolic function is normal. The right ventricular  size is normal.   3. Left atrial size was moderately dilated.   4. Mild appearing MR but some splay artifact cannot r/o some posterior  leaflet prolapse in addition to MAC with anteriorly directed MR Given  bacteremia and abnormal posterior mitral annulus suggest TEE to further  assess if clnically indicated. The  mitral valve is degenerative. Mild mitral valve regurgitation. No evidence  of mitral stenosis. Moderate mitral annular calcification.   5. The aortic valve is tricuspid. There is moderate calcification of the  aortic valve. There is moderate thickening of the aortic valve. Aortic  valve regurgitation is not visualized. Mild aortic valve stenosis.   6. The inferior vena cava is dilated in size with >50% respiratory  variability, suggesting right atrial pressure of 8 mmHg.   Patient Profile     72 y.o. male with a hx of ESRD on HD MWF, type 2 dM, anemia of CKD, HTN, HLD, Mild MR who is being seen 04/04/2023 for the evaluation of atrial fibrillation, possible endocarditis at the request of Dr. Thedore Mins    Assessment & Plan    MSSA bacteremia/septic arthritis:  Possible endocarditis by current criteria.   TEE today.  He would be high risk for surgery.  However, surgery would be indicated for his MR if endocarditis is confirmed and if he is thought to have persistent infection, large vegetations (not seen on TTE) or complications like acute CHF.     Atrial fib:    On heparin.   Held secondary to blood seen in the stool.  I am not suggesting amiodarone at this point.   Rate has not been out of proportion to the clinical scenario.    MR:  TEE as above.   NSVT:  Potassium supplemented.   Sepsis:  Per CCM.     For questions or updates, please contact [facility] Please consult [email or web address] for contact info  under Cardiology/STEMI.   Signed, Rollene Rotunda, MD  04/10/2023, 9:46 AM

## 2023-04-10 NOTE — Interval H&P Note (Signed)
History and Physical Interval Note:  04/10/2023 11:28 AM  Peter [name]  has presented today for surgery, with the diagnosis of bacteremia.  The various methods of treatment have been discussed with the patient and family. After consideration of risks, benefits and other options for treatment, the patient has consented to  Procedure(s): TRANSESOPHAGEAL ECHOCARDIOGRAM (N/A) as a surgical intervention.  The patient's history has been reviewed, patient examined, no change in status, stable for surgery.  I have reviewed the patient's chart and labs.  Questions were answered to the patient's satisfaction.     [name]

## 2023-04-10 NOTE — Progress Notes (Signed)
NAME:  Peter [name], MRN:  [id number], DOB:  [birth year], LOS: 9 ADMISSION DATE:  04/01/2023, CONSULTATION DATE:  04/01/2023 REFERRING MD:  Dr. Thedore Mins - [facility], CHIEF COMPLAINT:   weakness/ fall/ NV   History of Present Illness:  45 yoM with PMH as below, significant for ESRD on MWF iHD via RUE AVF, afib on Eliquis, T2DM, anemia of CKD, HTN, HL, and OSA not on CPAP who presented with weakness from home. Started not to feel well on Sunday, 6/30 with nausea and vomiting. Sustained a fall, onto right knee and left shoulder pain. Admitted to [facility] on 7/2 for further workup with suspected sepsis, febrile 102.3 on admit. Blood cultures positive for MSSA bacteremia, source unclear at this time, being treated additionally for aspiration pneumonia, and mild transaminitis. Nephrology, ID, GI, and palliative care following. Ortho consulted and underwent aspiration of right knee showing many WBC, primarily PMNs. He underwent iHD 7/3 with 1.4L removed 7/3. TTE pending. Overnight, had intermittent low blood pressure episodes and patient is more sonolucent. Given concern for worsening sepsis, PCCM consulted.   Pertinent  Medical History  ESRD on MWF iHD via RUE AVF, afib on Eliquis, T2DM, anemia of CKD, HTN, HL, and OSA not on CPAP   Significant Hospital Events: Including procedures, antibiotic start and stop dates in addition to other pertinent events   7/2 admitted  7/3 iHD, right knee joint asp 7/8 to OR for I&D of right knee and bilateral shoulders.  7/9 iHD, eliquis restarted  7/10 poor PO intake, increased midodrine, iHD UF 1.5L, eliquis held for concern of bloody stools, WBC up/ afebrile  Interim History / Subjective:  Ate better yesterday MRI brain overnight, received ativan, still groggy.  No complaints at rest, some joint pain/ stiffness with movement TEE scheduled today around lunch Frequent PVC's this am Reported bowel movements w/blood noted, H/H remains stable  Objective   Blood pressure (!)  104/53, pulse 82, temperature (!) 97.5 F (36.4 C), temperature source Oral, resp. rate 18, height 5\' 6"  (1.676 m), weight 104.3 kg, SpO2 95%. CVP:  [2 mmHg-14 mmHg] 10 mmHg      Intake/Output Summary (Last 24 hours) at 04/10/2023 0900 Last data filed at 04/10/2023 0700 Gross per 24 hour  Intake 1407.41 ml  Output 1500 ml  Net -92.59 ml   Filed Weights   04/09/23 0515 04/09/23 0800 04/10/23 0600  Weight: 104 kg 104 kg 104.3 kg   Examination:  General:  chronically ill male lying comfortable in bed  HEENT: MM pink/moist, pupils 3/r, poor dentition Neuro: awakens to verbal, f/c, oriented to person, place, time, MAE w/ some pain in joints  CV: irir, afib with frequent PVCs, +,murmur PULM:  non labored, clear and diminished, room air GI: obese, soft, bs+, NT Extremities: warm/dry, BUE np edema, RUE AVF, no LE tibial edema Skin: ?janeaway lesion to left 2nd toe  Labs reviewed> Na 130, K 3.4, WBC 27> 15.5, H/H stable 9.4-10.6 Net +2L afebrile  Resolved Hospital Problem list   Toxic/ metabolic encephalopathy  Assessment & Plan:  Sepsis with MSSA bacteremia with 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  Appreciate ortho assistance  - ID following, appreciate input.  ABX per ID -  MRI brain overnight neg - Cardiology following, plans for TEE 7/11 today  - PMT following.  Pt and significant other confirm, remains full code currently but patient would not prolonged life support, trach, feeding tube, etc.    Aspiration PNA OSA- untreated  P: - prn O2 for sat goal > 92%, remains on room air  - aspiration precautions - ongoing pulmonary hygiene   ESRD on IHD MWF Anion gap metabolic acidosis, resolved P: -  per Nephrology  - s/p iHD 7/10  PAF  Frequent ectopy P: - remains in afib, rates controlled but with frequent PVC's/ runs this am.  Repleting K and calcium.  Add on Mag - eliquis held 7/10 w/concern for GIB.  H/H has remained stable.  Plans to start heparin per pharmacy after TEE and closely monitor H/H and for further GI bleeding.    - optimize electrolytes, K, Mag, iCa  Hyponatremia Hyperkalemia Hypocalcemia  P: - trend BMET, replete prn   Protein calorie malnutrition Hypoalbuminemia Physical deconditioning Fall -Albumin less than 1.5 P: - poor PO intake/ nutrition, better 7/10.  Would not want a feeding tube/ cortrak, ever.  RD assistance appreciated to help optimize nutrition - PT when able  R/o GIB P:  - dark BM with some visible blood 7/10.  Small amounts overnight, H/H remains stable   - PPI BID - hold eliquis. Will start Heparin gtt as above after TEE today, monitor closely for bleeding  - keep T&S updated   Metabolic encephalopathy - more confused in am's - MRI brain neg - minimize sedating meds  - delirium precautions - supportive care as above    Best Practice (right click and "Reselect all SmartList Selections" daily)   Diet/type: Regular consistency (see orders)> NPO for TEE  DVT prophylaxis: SCD GI prophylaxis: PPI Lines: Central line Foley:  N/A Code Status:  full code Last date of multidisciplinary goals of care discussion: Full code with all aggressive interventions per patient   Patient and s/o, Sarah updated at bedside 7/11   Labs   CBC: Recent Labs  Lab 04/05/23 0315 04/06/23 0511 04/07/23 0701 04/07/23 1056 04/07/23 1453 04/07/23 2054 04/08/23 0439 04/08/23 1701 04/09/23 0234 04/09/23 1319 04/09/23 1755 04/09/23 2323 04/10/23 0549  WBC 10.3 15.1* 16.8*  --  19.5* 18.9* 19.0*  --  27.0*  --   --   --  15.5*  NEUTROABS 7.3 11.6* 12.9*  --   --   --  16.2*  --  25.4*  --   --   --   --   HGB 12.4* 12.6* 10.8*   < > 10.6* 9.5*  9.9*   < > 10.6* 9.6* 9.8* 9.6* 9.4*  HCT 35.4* 35.0* 30.7*   < > 31.2* 28.2* 28.7*   < > 30.1* 27.0* 26.8* 27.3* 27.0*  MCV 95.4 94.1 92.7  --  95.4 96.2 96.6  --  92.6  --   --   --  92.5  PLT 130* 185 205  --  204 196 209  --  211  --   --   --  172   < > = values in this interval not displayed.    Basic Metabolic Panel: Recent Labs  Lab 04/04/23 0441 04/06/23 0815 04/07/23 1453 04/08/23 1212 04/08/23 1701 04/09/23 0234 04/09/23 2323 04/10/23 0549  NA 127*   < > 130* 131* 130* 125* 130* 130*  130*  K 4.1   < > 5.5* 3.4* 4.0 3.9 3.5 3.4*  3.4*  CL  89*   < > 90* 94*  --  90* 90* 88*  90*  CO2 24   < > 20* 25  --  21* 21* 22  22  GLUCOSE 108*   < > 79 94  --  112* 178* 162*  163*  BUN 77*   < > 98* 40*  --  47* 34* 37*  37*  CREATININE 8.18*   < > 8.13* 4.21*  --  5.07* 4.12* 4.54*  4.49*  CALCIUM 8.2*   < > 6.3* 6.5*  --  6.1* 6.9* 6.8*  6.7*  MG 2.3  --  2.5* 1.8  --  2.3 2.0  --   PHOS  --   --  11.3*  --   --   --  7.0* 7.0*   < > = values in this interval not displayed.   GFR: Estimated Creatinine Clearance: 16.2 mL/min (A) (by C-G formula based on SCr of 4.54 mg/dL (H)). Recent Labs  Lab 04/06/23 0511 04/07/23 0701 04/07/23 1453 04/07/23 2054 04/08/23 0439 04/09/23 0234 04/10/23 0549  PROCALCITON 18.04 13.17  --   --  10.12 6.64  --   WBC 15.1* 16.8*   < > 18.9* 19.0* 27.0* 15.5*   < > = values in this interval not displayed.    Liver Function Tests: Recent Labs  Lab 04/06/23 0815 04/07/23 0701 04/07/23 1453 04/08/23 0439 04/09/23 0234 04/10/23 0549  AST 89* 64* 65* 57* 55*  --   ALT 40 29 25 19 19   --   ALKPHOS 80 68 65 67 106  --   BILITOT 9.8* 10.3* 11.8* 12.9* 10.4*  --   PROT 6.6 6.1* 6.4* 6.5 6.8  --   ALBUMIN <1.5* <1.5* <1.5* <1.5* <1.5* <1.5*   No results for input(s): "LIPASE", "AMYLASE" in the last 168 hours. Recent Labs  Lab 04/03/23 1058  AMMONIA 20    ABG    Component Value Date/Time   HCO3 25.1 04/08/2023 1701    TCO2 26 04/08/2023 1701   O2SAT 68 04/08/2023 1701     Coagulation Profile: Recent Labs  Lab 04/06/23 0511 04/10/23 0549  INR 1.7* 2.2*    Cardiac Enzymes: No results for input(s): "CKTOTAL", "CKMB", "CKMBINDEX", "TROPONINI" in the last 168 hours.  HbA1C: Hgb A1c MFr Bld  Date/Time Value Ref Range Status  05/30/2018 02:49 AM 5.7 (H) 4.8 - 5.6 % Final    Comment:    (NOTE) Pre diabetes:          5.7%-6.4% Diabetes:              >6.4% Glycemic control for   <7.0% adults with diabetes     CBG: Recent Labs  Lab 04/09/23 1154 04/09/23 1938 04/10/23 0005 04/10/23 0006 04/10/23 0853  GLUCAP 89 128* 184* 195* 141*    Critical care time: 35 min     Posey Boyer, MSN, NP, AG-ACNP-BC [facility] 04/10/2023, 9:00 AM  See Amion for pager If no response to pager , please call [phone] until 7pm After 7:00 pm call Elink  [phone]

## 2023-04-10 NOTE — Anesthesia Postprocedure Evaluation (Signed)
Anesthesia Post Note  Patient: Peter [name]  Procedure(s) Performed: TRANSESOPHAGEAL ECHOCARDIOGRAM     Patient location during evaluation: PACU Anesthesia Type: MAC Level of consciousness: awake Pain management: pain level controlled Vital Signs Assessment: post-procedure vital signs reviewed and stable Respiratory status: spontaneous breathing, nonlabored ventilation and respiratory function stable Cardiovascular status: stable and blood pressure returned to baseline Postop Assessment: no apparent nausea or vomiting Anesthetic complications: no   No notable events documented.  Last Vitals:  Vitals:   04/10/23 1245 04/10/23 1250  BP: (!) 181/74 (!) 186/69  Pulse: 81 79  Resp:    Temp:    SpO2: 95% 96%    Last Pain:  Vitals:   04/10/23 1300  TempSrc:   PainSc: 0-No pain                 Linton Rump

## 2023-04-10 NOTE — Transfer of Care (Signed)
Immediate Anesthesia Transfer of Care Note  Patient: Billey Wojciak  Procedure(s) Performed: TRANSESOPHAGEAL ECHOCARDIOGRAM  Patient Location: PACU  Anesthesia Type:MAC  Level of Consciousness: drowsy and patient cooperative  Airway & Oxygen Therapy: Patient Spontanous Breathing and Patient connected to nasal cannula oxygen  Post-op Assessment: Report given to RN, Post -op Vital signs reviewed and stable, and Patient moving all extremities X 4  Post vital signs: Reviewed and stable96/47  Last Vitals:  Vitals Value Taken Time  BP 96/47   Temp    Pulse 81 04/10/23 1223  Resp 14   SpO2 94 % 04/10/23 1223  Vitals shown include unfiled device data.  Last Pain:  Vitals:   04/10/23 1114  TempSrc: Temporal  PainSc: 0-No pain      Patients Stated Pain Goal: 0 (04/03/23 2149)  Complications: No notable events documented.

## 2023-04-10 NOTE — Progress Notes (Signed)
ANTICOAGULATION CONSULT NOTE - follow-up  Pharmacy Consult for heparin Indication: atrial fibrillation  Allergies  Allergen Reactions   Doxazosin Mesylate     ineffective for BP (10/2014)   Triamterene-Hctz     increased creatinine (see 09/2014 office note)    Patient Measurements: Height: 5\' 6"  (167.6 cm) Weight: 104.3 kg (229 lb 15 oz) IBW/kg (Calculated) : 63.8 Heparin Dosing Weight: 85.6 kg  Vital Signs: Temp: 98 F (36.7 C) (07/11 1330) Temp Source: Oral (07/11 1330) BP: 186/69 (07/11 1250) Pulse Rate: 85 (07/11 1430)  Labs: Recent Labs    04/08/23 0439 04/08/23 1212 04/08/23 2210 04/09/23 0152 04/09/23 0234 04/09/23 0455 04/09/23 1319 04/09/23 1755 04/09/23 2323 04/10/23 0549  HGB 9.9*   < >  --   --  10.6*  --    < > 9.8* 9.6* 9.4*  HCT 28.7*   < >  --   --  30.1*  --    < > 26.8* 27.3* 27.0*  PLT 209  --   --   --  211  --   --   --   --  172  APTT 48*  --   --   --   --   --   --   --   --   --   LABPROT  --   --   --   --   --   --   --   --   --  24.6*  INR  --   --   --   --   --   --   --   --   --  2.2*  HEPARINUNFRC <0.10*  --   --   --   --   --   --   --   --   --   CREATININE  --    < >  --   --  5.07*  --   --   --  4.12* 4.54*  4.49*  TROPONINIHS  --    < > 121* 137*  --  156*  --   --   --   --    < > = values in this interval not displayed.    Estimated Creatinine Clearance: 16.2 mL/min (A) (by C-G formula based on SCr of 4.54 mg/dL (H)).   Assessment: 68 YOM w/ prior history of AF requiring heparin gtt. Last dose of apixaban given ~10 AM on 04/02/2023. Patient had fluoro guided right and left shoulder aspiration on 7/6. Plans are for joint washout this week  Heparin held with dark stools now to resume post TEE. Will target lower goal range and check both HL and aPTT given elevated TBili.  Goal of Therapy:  Heparin level 0.3-0.5 units/ml; aPTT 66-85 seconds Monitor platelets by anticoagulation protocol: Yes   Plan:  Heparin 1000  units/h no bolus Check heparin level and aPTT in 8h  Fredonia Highland, PharmD, [place], [facility] Clinical Pharmacist [phone] Please check AMION for all [facility] numbers 04/10/2023

## 2023-04-10 NOTE — Progress Notes (Signed)
Pharmacy Antibiotic Note  Peter [name] is a 72 y.o. male with ESRD on HD (HD sessions: MWF) admitted on 04/01/2023 with MSSA bacteremia/osteomyelitis. Patient has been on nafcillin and is switching to cefazolin due to negative brain MRI. TEE pending today. Pharmacy has been consulted for cefazolin dosing.  Plan: Initiate cefazolin 1g IV Q24H to be given in the evenings after patient's HD sessions. 6 total weeks of treatment planned.   Can switch to cefazolin 2g IV with each HD session when patient is receiving consistently scheduled HD.   Height: 5\' 6"  (167.6 cm) Weight: 104.3 kg (229 lb 15 oz) IBW/kg (Calculated) : 63.8  Temp (24hrs), Avg:98.2 F (36.8 C), Min:97.5 F (36.4 C), Max:98.5 F (36.9 C)  Recent Labs  Lab 04/07/23 1453 04/07/23 2054 04/08/23 0439 04/08/23 1212 04/09/23 0234 04/09/23 2323 04/10/23 0549  WBC 19.5* 18.9* 19.0*  --  27.0*  --  15.5*  CREATININE 8.13*  --   --  4.21* 5.07* 4.12* 4.54*  4.49*    Estimated Creatinine Clearance: 16.2 mL/min (A) (by C-G formula based on SCr of 4.54 mg/dL (H)).    Allergies  Allergen Reactions   Doxazosin Mesylate     ineffective for BP (10/2014)   Triamterene-Hctz     increased creatinine (see 09/2014 office note)    Antimicrobials this admission: Nafcillin 2g IV Q4H 7/4 >> 7/11 Cefazolin 7/11 >>   Microbiology results: 7/2 BCx: MSSA 7/3 MRSA PCR: negative 7/3 Wound Cx: rare Staph aureus 7/4 BCx: NGTD 7/6 Wound Cx: rare Staph aureus 7/8 Wound Cx: few Staph aureus   Thank you for allowing pharmacy to be a part of this patient's care.  Lendon Ka, PharmD Candidate 04/10/2023 11:46 AM

## 2023-04-10 NOTE — Progress Notes (Addendum)
ANTICOAGULATION CONSULT NOTE - follow-up  Pharmacy Consult for heparin Indication: atrial fibrillation  Allergies  Allergen Reactions   Doxazosin Mesylate     ineffective for BP (10/2014)   Triamterene-Hctz     increased creatinine (see 09/2014 office note)    Patient Measurements: Height: 5\' 6"  (167.6 cm) Weight: 104.3 kg (229 lb 15 oz) IBW/kg (Calculated) : 63.8 Heparin Dosing Weight: 85.6 kg  Vital Signs: Temp: 98.9 F (37.2 C) (07/11 2317) Temp Source: Oral (07/11 2317) BP: 117/53 (07/11 2200) Pulse Rate: 78 (07/11 2230)  Labs: Recent Labs    04/08/23 0439 04/08/23 1212 04/08/23 2210 04/09/23 0152 04/09/23 0234 04/09/23 0455 04/09/23 1319 04/09/23 2323 04/10/23 0549 04/10/23 1556 04/10/23 2314  HGB 9.9*   < >  --   --  10.6*  --    < > 9.6* 9.4* 9.4*  --   HCT 28.7*   < >  --   --  30.1*  --    < > 27.3* 27.0* 26.4*  --   PLT 209  --   --   --  211  --   --   --  172  --   --   APTT 48*  --   --   --   --   --   --   --   --   --   --   LABPROT  --   --   --   --   --   --   --   --  24.6*  --   --   INR  --   --   --   --   --   --   --   --  2.2*  --   --   HEPARINUNFRC <0.10*  --   --   --   --   --   --   --   --   --  0.32  CREATININE  --    < >  --   --  5.07*  --   --  4.12* 4.54*  4.49* 5.04*  --   TROPONINIHS  --    < > 121* 137*  --  156*  --   --   --   --   --    < > = values in this interval not displayed.    Estimated Creatinine Clearance: 14.6 mL/min (A) (by C-G formula based on SCr of 5.04 mg/dL (H)).   Assessment: 61 YOM w/ prior history of AF requiring heparin gtt. Last dose of apixaban given ~10 AM on 04/02/2023. Patient had fluoro guided right and left shoulder aspiration on 7/6. Plans are for joint washout this week  Heparin held with dark stools now to resume post TEE. Will target lower goal range and check both HL and aPTT given elevated TBili. (10.4 on 7/10)  Heparin level returned at 0.32-therapeutic and aPTT 131 -supratherapeutic.  Per RN no issues with running the infusion or signs/symptoms of bleeding. Given elevated T. Bili will recheck heparin level in AM and hepatic function panel. Will discontinue aPTT given false elevation with renal and liver dysfunction  Goal of Therapy:  Heparin level 0.3-0.5 units/ml Monitor platelets by anticoagulation protocol: Yes   Plan:  Continue heparin at 1000 units/h no bolus Heparin level in AM Check heparin level daily labs Monitor for signs/symptoms of bleeding  Arabella Merles, PharmD. Clinical Pharmacist 04/10/2023 11:50 PM   ADDENDUM: Heparin level returned at 0.33. Will continue  current rate and continue to monitor T. Bili as it trends down and daily heparin levels  Arabella Merles, PharmD. Clinical Pharmacist 04/11/2023 5:16 AM

## 2023-04-10 NOTE — TOC Progression Note (Signed)
Transition of Care [facility]) - Progression Note    Patient Details  Name: Peter [name] MRN: [id number] Date of Birth: 1950/11/27  Transition of Care [facility]) CM/SW Contact  Delilah Shan, LCSWA Phone Number: 04/10/2023, 4:20 PM  Clinical Narrative:     CSW following to fax patient out closer to patient being medically ready for dc. CSW will continue to follow and assist with patients dc planning needs.  Expected Discharge Plan: Skilled Nursing Facility Barriers to Discharge: Continued Medical Work up  Expected Discharge Plan and Services In-house Referral: Clinical Social Work   Post Acute Care Choice: Dialysis Living arrangements for the past 2 months: Single Family Home                                       Social Determinants of Health (SDOH) Interventions SDOH Screenings   Food Insecurity: No Food Insecurity (04/01/2023)  Housing: Patient Declined (04/01/2023)  Transportation Needs: No Transportation Needs (04/01/2023)  Utilities: Not At Risk (04/01/2023)  Financial Resource Strain: Low Risk  (05/30/2018)  Physical Activity: Inactive (05/30/2018)  Social Connections: Socially Integrated (05/30/2018)  Stress: No Stress Concern Present (05/30/2018)  Tobacco Use: High Risk (04/08/2023)    Readmission Risk Interventions     No data to display

## 2023-04-10 NOTE — Progress Notes (Signed)
PT Cancellation Note  Patient Details Name: Keionte Swicegood MRN: [id number] DOB: [birth year]   Cancelled Treatment:    Reason Eval/Treat Not Completed: Patient at procedure or test/unavailable   Angelina Ok [facility] 04/10/2023, 11:44 AM Skip Mayer PT Acute Rehabilitation Services Office [phone]

## 2023-04-10 NOTE — Progress Notes (Signed)
[facility]  Date of Admission:  04/01/2023      Lines: 7/10-c left radial a-line 7/8-c left internal jugular cvc  Left avg/avf  Abx: 7/04-c nafcillin  7/07 intraarticular tobramycin 7/04 amp/sulbactam 7/02-03 cefazolin  ASSESSMENT: 72 yo male with left prosthetic hip, esrd on iHD (right UE avg), p-afib on anticoagulation, admitted 7/02 with sepsis in setting disseminated mssa infection/bsi  Pain right knee/bilateral shoulder/right wrist on presentation and ?neck  mri showed effusion bilateral glenohumeral joint and left  xray showed right knee effusion/arthritis Right wrist ct showed mild effusion Tte with question of MAC vs true vegetation  S/p right knee aspiration 7/03 -- cx mssa S/p IR bilateral shoulder arthrocentesis 7/06 -- cx mssa on right, not left 7/08 another sample noted for left shoulder and that grew mssa   7/2 blood cx mssa 7/4 blood cx ngtd   ------- 7/10 assessment Transferred to icu 7/07 for soft blood pressure  S/p I&D of bilateral shoulder and right knee 7/08. Attempt to aspirate the right wrist doesn't yield any fluid  No sign of rue avg infection  Mentation no obvious decline and to me has been improving. Concern about cns involvement and had been on nafcillin since 7/4. Will get brain mri and see if we can change to less toxic abx with cefazolin  7/09 increased pressors requirement and increasing leukocytosis. Cardiology want to do tee which from my standpoint is always a plus. If continued leukocytosis or other sign of worsening clinical status, will get abdominal pelvis ct with contrast as well to look for developing abscess that can be drained  He does behave like endocarditis -- 7/10 exam noted probable janeway lesion left 2nd toe (see picture)  -------------- 7/11 assessment Remains on 2 pressors but less aggressive dose Intermittent left hip discomfort reported by wife but not reaffirmed by  patient -- exam without tenderness left hip joint (prior prosthetics there)  Wbc down Afebrile  Tee planned today  Brain mri no concerning infectious changes   PLAN: Await tee Stop nafcillin Start cefazolin for mssa bacteremia Discussed with primary team   I spent more than 50 minute reviewing data/chart, and coordinating care, providing direct face to face time providing counseling/discussing diagnostics/treatment plan with patient and treatment team   Principal Problem:   MSSA bacteremia Active Problems:   Obstructive sleep apnea   Essential hypertension, benign   MVP (mitral valve prolapse)   ESRD on dialysis (HCC)   Anemia in chronic kidney disease   PAF (paroxysmal atrial fibrillation) (HCC)   Aspiration pneumonia (HCC)   Staphylococcal arthritis of right knee (HCC)   Staphylococcal arthritis of right wrist (HCC)   Staphylococcal arthritis of shoulder (HCC)   Staphylococcal arthritis of right shoulder (HCC)   Sepsis (HCC)   Allergies  Allergen Reactions   Doxazosin Mesylate     ineffective for BP (10/2014)   Triamterene-Hctz     increased creatinine (see 09/2014 office note)    Scheduled Meds:  [MAR Hold] (feeding supplement) PROSource Plus  30 mL Oral BID BM   [MAR Hold] atorvastatin  10 mg Oral QHS   [MAR Hold] Chlorhexidine Gluconate Cloth  6 each Topical Q0600   [MAR Hold] Chlorhexidine Gluconate Cloth  6 each Topical Q0600   [MAR Hold] feeding supplement  237 mL Oral TID BM   [MAR Hold] ferric citrate  630 mg Oral TID WC   [MAR Hold] insulin aspart  0-9 Units Subcutaneous Q4H   [  MAR Hold] midodrine  15 mg Oral TID WC   [MAR Hold] multivitamin  1 tablet Oral QHS   [MAR Hold] pantoprazole (PROTONIX) IV  40 mg Intravenous Q12H   Continuous Infusions:  [MAR Hold] sodium chloride     sodium chloride     [MAR Hold] anticoagulant sodium citrate     [MAR Hold]  ceFAZolin (ANCEF) IV     [MAR Hold] norepinephrine (LEVOPHED) Adult infusion 6 mcg/min  (04/10/23 1150)   vasopressin 0.02 Units/min (04/10/23 1150)   PRN Meds:.Place/Maintain arterial line **AND** [MAR Hold] sodium chloride, [MAR Hold] acetaminophen, [MAR Hold] alteplase, [MAR Hold] anticoagulant sodium citrate, [MAR Hold] diltiazem, [MAR Hold] heparin, [MAR Hold] heparin, [MAR Hold] heparin, [MAR Hold] lidocaine (PF), [MAR Hold] lidocaine-prilocaine, [MAR Hold] LORazepam, [MAR Hold] mouth rinse, [MAR Hold] oxyCODONE, [MAR Hold] pentafluoroprop-tetrafluoroeth   SUBJECTIVE: Decreasing vasopressin dose Afebrile Wbc down No focal pain but wife reports ?left hip discomfort intermittently No n/v/diarrhea Tee pending today      Review of Systems: ROS All other ROS was negative, except mentioned above     OBJECTIVE: Vitals:   04/10/23 1000 04/10/23 1114 04/10/23 1115 04/10/23 1130  BP: (!) 107/35 (!) 113/52 (!) 113/52 (!) 113/50  Pulse: 72 78 94 75  Resp: 20 (!) 21 (!) 22 18  Temp:  98.1 F (36.7 C)    TempSrc:  Temporal    SpO2: (!) 89% 94% 96% 96%  Weight:  104.3 kg    Height:  5\' 6"  (1.676 m)     Body mass index is 37.11 kg/m.  Physical Exam General/constitutional: anasarca; conversant; no distress HEENT: normocephalic Cv: rrr no mrg Lungs: clear; normal respiratory effort Abd: Soft, Nontender Ext: anasarca Skin: right upper ext avg felt and nontender; left 2nd distal toe black papule with slight ulceration nontender Neuro: alert, generalized eawkness MSK: bilateral shoulder, right knee dressing clean/dry; left hip joint nontender on palpation/log roll test  Central line presence: left internal jugular cvc site clean  7/10 picture   Lab Results Lab Results  Component Value Date   WBC 15.5 (H) 04/10/2023   HGB 9.4 (L) 04/10/2023   HCT 27.0 (L) 04/10/2023   MCV 92.5 04/10/2023   PLT 172 04/10/2023    Lab Results  Component Value Date   CREATININE 4.54 (H) 04/10/2023   CREATININE 4.49 (H) 04/10/2023   BUN 37 (H) 04/10/2023   BUN 37 (H)  04/10/2023   NA 130 (L) 04/10/2023   NA 130 (L) 04/10/2023   K 3.4 (L) 04/10/2023   K 3.4 (L) 04/10/2023   CL 88 (L) 04/10/2023   CL 90 (L) 04/10/2023   CO2 22 04/10/2023   CO2 22 04/10/2023    Lab Results  Component Value Date   ALT 19 04/09/2023   AST 55 (H) 04/09/2023   ALKPHOS 106 04/09/2023   BILITOT 10.4 (H) 04/09/2023      Microbiology: Recent Results (from the past 240 hour(s))  Resp panel by RT-PCR (RSV, Flu A&B, Covid) Anterior Nasal Swab     Status: None   Collection Time: 04/01/23  8:23 AM   Specimen: Anterior Nasal Swab  Result Value Ref Range Status   SARS Coronavirus 2 by RT PCR NEGATIVE NEGATIVE Final   Influenza A by PCR NEGATIVE NEGATIVE Final   Influenza B by PCR NEGATIVE NEGATIVE Final    Comment: (NOTE) The Xpert Xpress SARS-CoV-2/FLU/RSV plus assay is intended as an aid in the diagnosis of influenza from Nasopharyngeal swab specimens and should not be  used as a sole basis for treatment. Nasal washings and aspirates are unacceptable for Xpert Xpress SARS-CoV-2/FLU/RSV testing.  Fact Sheet for Patients: BloggerCourse.com  Fact Sheet for Healthcare Providers: SeriousBroker.it  This test is not yet approved or cleared by the [place] FDA and has been authorized for detection and/or diagnosis of SARS-CoV-2 by FDA under an Emergency Use Authorization (EUA). This EUA will remain in effect (meaning this test can be used) for the duration of the COVID-19 declaration under Section 564(b)(1) of the Act, 21 U.S.C. section 360bbb-3(b)(1), unless the authorization is terminated or revoked.     Resp Syncytial Virus by PCR NEGATIVE NEGATIVE Final    Comment: (NOTE) Fact Sheet for Patients: BloggerCourse.com  Fact Sheet for Healthcare Providers: SeriousBroker.it  This test is not yet approved or cleared by the [place] FDA and has been authorized  for detection and/or diagnosis of SARS-CoV-2 by FDA under an Emergency Use Authorization (EUA). This EUA will remain in effect (meaning this test can be used) for the duration of the COVID-19 declaration under Section 564(b)(1) of the Act, 21 U.S.C. section 360bbb-3(b)(1), unless the authorization is terminated or revoked.  Performed at [facility], [address] [place], [place] [address]   Blood Culture (routine x 2)     Status: Abnormal   Collection Time: 04/01/23  8:51 AM   Specimen: BLOOD LEFT FOREARM  Result Value Ref Range Status   Specimen Description BLOOD LEFT FOREARM  Final   Special Requests   Final    BOTTLES DRAWN AEROBIC AND ANAEROBIC Blood Culture results may not be optimal due to an excessive volume of blood received in culture bottles   Culture  Setup Time   Final    GRAM POSITIVE COCCI IN CLUSTERS IN BOTH AEROBIC AND ANAEROBIC BOTTLES CRITICAL RESULT CALLED TO, READ BACK BY AND VERIFIED WITH: PHARMD HAILEY VONDOLEN ON 04/01/23 @ 2017 BY DRT Performed at [facility], [address], [place], [place] [address]    Culture STAPHYLOCOCCUS AUREUS (A)  Final   Report Status 04/03/2023 FINAL  Final   Organism ID, Bacteria STAPHYLOCOCCUS AUREUS  Final      Susceptibility   Staphylococcus aureus - MIC*    CIPROFLOXACIN <=0.5 SENSITIVE Sensitive     ERYTHROMYCIN <=0.25 SENSITIVE Sensitive     GENTAMICIN <=0.5 SENSITIVE Sensitive     OXACILLIN <=0.25 SENSITIVE Sensitive     TETRACYCLINE <=1 SENSITIVE Sensitive     VANCOMYCIN 1 SENSITIVE Sensitive     TRIMETH/SULFA <=10 SENSITIVE Sensitive     CLINDAMYCIN <=0.25 SENSITIVE Sensitive     RIFAMPIN <=0.5 SENSITIVE Sensitive     Inducible Clindamycin NEGATIVE Sensitive     LINEZOLID 2 SENSITIVE Sensitive     * STAPHYLOCOCCUS AUREUS  Blood Culture ID Panel (Reflexed)     Status: Abnormal   Collection Time: 04/01/23  8:51 AM  Result Value Ref Range Status   Enterococcus faecalis NOT DETECTED NOT DETECTED Final    Enterococcus Faecium NOT DETECTED NOT DETECTED Final   Listeria monocytogenes NOT DETECTED NOT DETECTED Final   Staphylococcus species DETECTED (A) NOT DETECTED Final    Comment: CRITICAL RESULT CALLED TO, READ BACK BY AND VERIFIED WITH: PHARMD HAILEY VONDOLEN ON 04/01/23 @ 2017 BY DRT    Staphylococcus aureus (BCID) DETECTED (A) NOT DETECTED Final    Comment: CRITICAL RESULT CALLED TO, READ BACK BY AND VERIFIED WITH: PHARMD HAILEY VONDOLEN ON 04/01/23 @ 2017 BY DRT    Staphylococcus epidermidis NOT DETECTED  NOT DETECTED Final   Staphylococcus lugdunensis NOT DETECTED NOT DETECTED Final   Streptococcus species NOT DETECTED NOT DETECTED Final   Streptococcus agalactiae NOT DETECTED NOT DETECTED Final   Streptococcus pneumoniae NOT DETECTED NOT DETECTED Final   Streptococcus pyogenes NOT DETECTED NOT DETECTED Final   A.calcoaceticus-baumannii NOT DETECTED NOT DETECTED Final   Bacteroides fragilis NOT DETECTED NOT DETECTED Final   Enterobacterales NOT DETECTED NOT DETECTED Final   Enterobacter cloacae complex NOT DETECTED NOT DETECTED Final   Escherichia coli NOT DETECTED NOT DETECTED Final   Klebsiella aerogenes NOT DETECTED NOT DETECTED Final   Klebsiella oxytoca NOT DETECTED NOT DETECTED Final   Klebsiella pneumoniae NOT DETECTED NOT DETECTED Final   Proteus species NOT DETECTED NOT DETECTED Final   Salmonella species NOT DETECTED NOT DETECTED Final   Serratia marcescens NOT DETECTED NOT DETECTED Final   Haemophilus influenzae NOT DETECTED NOT DETECTED Final   Neisseria meningitidis NOT DETECTED NOT DETECTED Final   Pseudomonas aeruginosa NOT DETECTED NOT DETECTED Final   Stenotrophomonas maltophilia NOT DETECTED NOT DETECTED Final   Candida albicans NOT DETECTED NOT DETECTED Final   Candida auris NOT DETECTED NOT DETECTED Final   Candida glabrata NOT DETECTED NOT DETECTED Final   Candida krusei NOT DETECTED NOT DETECTED Final   Candida parapsilosis NOT DETECTED NOT DETECTED Final    Candida tropicalis NOT DETECTED NOT DETECTED Final   Cryptococcus neoformans/gattii NOT DETECTED NOT DETECTED Final   Meth resistant mecA/C and MREJ NOT DETECTED NOT DETECTED Final    Comment: Performed at [facility], [address] [place], [place] [address]  Blood Culture (routine x 2)     Status: None   Collection Time: 04/01/23  1:09 PM   Specimen: BLOOD LEFT HAND  Result Value Ref Range Status   Specimen Description BLOOD LEFT HAND  Final   Special Requests   Final    BOTTLES DRAWN AEROBIC AND ANAEROBIC Blood Culture adequate volume   Culture   Final    NO GROWTH 5 DAYS Performed at [facility], [address], [place], [place] [address]    Report Status 04/06/2023 FINAL  Final  Culture, blood (Routine X 2) w Reflex to ID Panel     Status: None   Collection Time: 04/01/23 11:15 PM   Specimen: BLOOD  Result Value Ref Range Status   Specimen Description BLOOD BLOOD LEFT ARM  Final   Special Requests   Final    BOTTLES DRAWN AEROBIC AND ANAEROBIC Blood Culture results may not be optimal due to an inadequate volume of blood received in culture bottles   Culture   Final    NO GROWTH 5 DAYS Performed at [facility], [address], [place], [place] [address]    Report Status 04/06/2023 FINAL  Final  Culture, blood (Routine X 2) w Reflex to ID Panel     Status: None   Collection Time: 04/01/23 11:21 PM   Specimen: BLOOD  Result Value Ref Range Status   Specimen Description BLOOD BLOOD LEFT HAND  Final   Special Requests   Final    BOTTLES DRAWN AEROBIC AND ANAEROBIC Blood Culture results may not be optimal due to an inadequate volume of blood received in culture bottles   Culture   Final    NO GROWTH 5 DAYS Performed at [facility], [address], [place], [place] [address]    Report Status 04/06/2023 FINAL  Final  MRSA Next Gen by PCR,  Nasal     Status: None   Collection Time: 04/02/23  5:42 AM   Specimen: Nasal Mucosa; Nasal Swab  Result  Value Ref Range Status   MRSA by PCR Next Gen NOT DETECTED NOT DETECTED Final    Comment: (NOTE) The GeneXpert MRSA Assay (FDA approved for NASAL specimens only), is one component of a comprehensive MRSA colonization surveillance program. It is not intended to diagnose MRSA infection nor to guide or monitor treatment for MRSA infections. Test performance is not FDA approved in patients less than 88 years old. Performed at [facility], [address] [place], [place] [address]   Body fluid culture w Gram Stain     Status: None   Collection Time: 04/02/23  9:13 PM   Specimen: Body Fluid  Result Value Ref Range Status   Specimen Description FLUID SYNOVIAL RIGHT KNEE  Final   Special Requests NONE  Final   Gram Stain   Final    ABUNDANT WBC PRESENT, PREDOMINANTLY PMN RARE GRAM POSITIVE COCCI INTRACELLULAR Gram Stain Report Called to,Read Back By and Verified With: RN Loney Loh [phone] @ 2258 FH Performed at [facility], [address] [place], [place] [address]    Culture RARE STAPHYLOCOCCUS AUREUS  Final   Report Status 04/05/2023 FINAL  Final   Organism ID, Bacteria STAPHYLOCOCCUS AUREUS  Final      Susceptibility   Staphylococcus aureus - MIC*    CIPROFLOXACIN <=0.5 SENSITIVE Sensitive     ERYTHROMYCIN <=0.25 SENSITIVE Sensitive     GENTAMICIN <=0.5 SENSITIVE Sensitive     OXACILLIN <=0.25 SENSITIVE Sensitive     TETRACYCLINE <=1 SENSITIVE Sensitive     VANCOMYCIN 1 SENSITIVE Sensitive     TRIMETH/SULFA <=10 SENSITIVE Sensitive     CLINDAMYCIN <=0.25 SENSITIVE Sensitive     RIFAMPIN <=0.5 SENSITIVE Sensitive     Inducible Clindamycin NEGATIVE Sensitive     LINEZOLID 2 SENSITIVE Sensitive     * RARE STAPHYLOCOCCUS AUREUS  Culture, blood (Routine X 2) w Reflex to ID Panel     Status: None   Collection Time: 04/03/23  2:43 PM   Specimen: BLOOD LEFT HAND  Result Value Ref Range Status   Specimen Description BLOOD LEFT HAND  Final   Special Requests   Final     BOTTLES DRAWN AEROBIC AND ANAEROBIC Blood Culture adequate volume   Culture   Final    NO GROWTH 5 DAYS Performed at [facility], [address], [place], [place] [address]    Report Status 04/08/2023 FINAL  Final  Culture, blood (Routine X 2) w Reflex to ID Panel     Status: None   Collection Time: 04/03/23  2:47 PM   Specimen: BLOOD LEFT HAND  Result Value Ref Range Status   Specimen Description BLOOD LEFT HAND  Final   Special Requests   Final    BOTTLES DRAWN AEROBIC AND ANAEROBIC Blood Culture adequate volume   Culture   Final    NO GROWTH 5 DAYS Performed at [facility], [address], [place], [place] [address]    Report Status 04/08/2023 FINAL  Final  Aerobic/Anaerobic Culture w Gram Stain (surgical/deep wound)     Status: None (Preliminary result)   Collection Time: 04/05/23 10:57 AM   Specimen: Body Fluid  Result Value Ref Range Status   Specimen Description FLUID  Final   Special Requests JOINT, LEFT SHOULDER  Final   Gram Stain   Final  MODERATE WBC PRESENT,BOTH PMN AND MONONUCLEAR NO ORGANISMS SEEN    Culture   Final    NO GROWTH 4 DAYS NO ANAEROBES ISOLATED; CULTURE IN PROGRESS FOR 5 DAYS Performed at [facility], [address], [place], [place] [address]    Report Status PENDING  Incomplete  Aerobic/Anaerobic Culture w Gram Stain (surgical/deep wound)     Status: None (Preliminary result)   Collection Time: 04/05/23 11:01 AM   Specimen: Body Fluid  Result Value Ref Range Status   Specimen Description FLUID  Final   Special Requests JOINT, RIGHT SHOULDER  Final   Gram Stain   Final    MODERATE WBC PRESENT, PREDOMINANTLY PMN FEW GRAM POSITIVE COCCI Performed at [facility], [address], [place], [place] [address]    Culture   Final    RARE STAPHYLOCOCCUS AUREUS NO ANAEROBES ISOLATED; CULTURE IN PROGRESS FOR 5 DAYS    Report Status PENDING  Incomplete   Organism ID, Bacteria STAPHYLOCOCCUS AUREUS  Final       Susceptibility   Staphylococcus aureus - MIC*    CIPROFLOXACIN <=0.5 SENSITIVE Sensitive     ERYTHROMYCIN <=0.25 SENSITIVE Sensitive     GENTAMICIN <=0.5 SENSITIVE Sensitive     OXACILLIN <=0.25 SENSITIVE Sensitive     TETRACYCLINE <=1 SENSITIVE Sensitive     VANCOMYCIN 1 SENSITIVE Sensitive     TRIMETH/SULFA <=10 SENSITIVE Sensitive     CLINDAMYCIN <=0.25 SENSITIVE Sensitive     RIFAMPIN <=0.5 SENSITIVE Sensitive     Inducible Clindamycin NEGATIVE Sensitive     LINEZOLID 2 SENSITIVE Sensitive     * RARE STAPHYLOCOCCUS AUREUS  Aerobic/Anaerobic Culture w Gram Stain (surgical/deep wound)     Status: None (Preliminary result)   Collection Time: 04/07/23 10:20 AM   Specimen: Synovial, Left Shoulder; Body Fluid  Result Value Ref Range Status   Specimen Description SYNOVIAL  Final   Special Requests NONE  Final   Gram Stain   Final    RARE WBC SEEN NO ORGANISMS SEEN Performed at [facility], [address] [place], [place] [address]    Culture   Final    FEW STAPHYLOCOCCUS AUREUS NO ANAEROBES ISOLATED; CULTURE IN PROGRESS FOR 5 DAYS    Report Status PENDING  Incomplete   Organism ID, Bacteria STAPHYLOCOCCUS AUREUS  Final      Susceptibility   Staphylococcus aureus - MIC*    CIPROFLOXACIN <=0.5 SENSITIVE Sensitive     ERYTHROMYCIN <=0.25 SENSITIVE Sensitive     GENTAMICIN <=0.5 SENSITIVE Sensitive     OXACILLIN <=0.25 SENSITIVE Sensitive     TETRACYCLINE <=1 SENSITIVE Sensitive     VANCOMYCIN 1 SENSITIVE Sensitive     TRIMETH/SULFA <=10 SENSITIVE Sensitive     CLINDAMYCIN <=0.25 SENSITIVE Sensitive     RIFAMPIN <=0.5 SENSITIVE Sensitive     Inducible Clindamycin NEGATIVE Sensitive     LINEZOLID 2 SENSITIVE Sensitive     * FEW STAPHYLOCOCCUS AUREUS     Serology:   Imaging: If present, new imagings (plain films, ct scans, and mri) have been personally visualized and interpreted; radiology reports have been reviewed. Decision making incorporated into the Impression  / Recommendations.   7/8 cxr Left IJ central venous line with tip at the junction of the innominate vein and SVC. No pneumothorax.   04/03/23 tte  1. Left ventricular ejection fraction, by estimation, is 60 to 65%. The left ventricle has normal function. The left ventricle has no regional wall motion abnormalities. There  is severe left ventricular hypertrophy. Left ventricular diastolic parameters   are indeterminate.   2. Right ventricular systolic function is normal. The right ventricular size is normal.   3. Left atrial size was moderately dilated.   4. Mild appearing MR but some splay artifact cannot r/o some posterior leaflet prolapse in addition to MAC with anteriorly directed MR. Given bacteremia and abnormal posterior mitral annulus suggest TEE to further assess if clnically indicated. The mitral valve is degenerative. Mild mitral valve regurgitation. No evidence of mitral stenosis. Moderate mitral annular calcification.   5. The aortic valve is tricuspid. There is moderate calcification of the aortic valve. There is moderate thickening of the aortic valve. Aortic valve regurgitation is not visualized. Mild aortic valve stenosis.   6. The inferior vena cava is dilated in size with >50% respiratory variability, suggesting right atrial pressure of 8 mmHg.     7/6 mri right shoulder 1. Large glenohumeral joint effusion, nonspecific. Arthrocentesis with joint fluid analysis is recommended to exclude the possibility of septic arthritis. 2. Complete full-thickness tears of the supraspinatus, infraspinatus, and subscapularis tendons with advanced rotator cuff muscle atrophy and fatty infiltration. 3. Torn and retracted long head of the biceps tendon. 4. Moderate glenohumeral and AC joint osteoarthritis.    7/5 ct right wrist 1. Patient scanned with arms down positioning which limits detailed osseous and soft tissue assessment due to soft tissue attenuation from habitus. 2. There may be a  small distal radioulnar joint effusion. Suspected small radiocarpal joint effusion. This is difficult to accurately assess on the current exam. No gross CT findings of osteomyelitis. 3. Mild edema about the ulnar aspect of the wrist and distal forearm. No soft tissue collection.    7/4 mri left shoulder 1. Complete tear of the supraspinatus and infraspinatus tendons with 5.7 cm of retraction. 2. Moderate tendinosis of the subscapularis tendon with a small partial-thickness tear. 3. Complete tear of the proximal long head of the biceps tendon. 4. Moderate osteoarthritis of the glenohumeral joint. Large glenohumeral joint effusion.    7/4 mri cspine 1. Diffuse cervical spine spondylosis as described above. 2. No acute osseous injury of the cervical spine. 3. No aggressive osseous lesion of the cervical spine. 4. At C3-4 there is a broad-based disc bulge with a small central disc protrusion. Moderate bilateral facet arthropathy. No severe bilateral foraminal stenosis. Severe spinal stenosis. 5. At C4-5 there is a broad-based disc bulge with a broad central disc protrusion. Severe spinal stenosis. Severe bilateral foraminal stenosis. 6. At C5-6 there is a mild broad-based disc bulge. Moderate right facet arthropathy. Severe bilateral foraminal stenosis. Mild spinal stenosis. 7. At C6-7 there is a mild broad-based disc bulge. Mild bilateral facet arthropathy. Severe right foraminal stenosis. Moderate left foraminal stenosis.   7/02 xray right knee Tricompartment degenerative changes identified, overall moderate greatest involving the medial compartment and patellofemoral joint.   Moderate joint effusion of uncertain etiology. This has a broad differential. This could be related to the degenerative changes. However please correlate for history of trauma or infection.   7/02 xray left knee Mild degenerative changes. No joint effusion.     Raymondo Band, MD [facility] [phone] pager    04/10/2023, 12:05 PM

## 2023-04-10 NOTE — CV Procedure (Signed)
     PROCEDURE NOTE:  Procedure:  Transesophageal echocardiogram Operator:  [name], MD Indications:  Bacteremia Complications: None   During this procedure the patient is administered a total of Propofol 40 mg and Etomidate 8mg  to achieve and maintain moderate conscious sedation.  The patient's heart rate, blood pressure, and oxygen saturation are monitored continuously during the procedure by anesthesia.   Results: Normal LV size and function with moderate  LVH Normal RV size and function Normal RA Moderately dilated LA with normal LA appendage and no thrombus Normal TV with mild TR Normal PV Abnormal Thickened MV leaflets with moderate mitral annular calcification.  There appears to be a flail P2 scallop and very eccentric MR directed anteriorly.  The MR is at least moderate to severe but no flow into the left upper PV.  The other pulmonary veins could not be identified.  The MR jet wraps around the left atrium. Trileaflet AV with moderate thickening and calcification and moderate aortic stenosis with planimetered AVA of 1.64cm2 consistent with mild aortic stenosis. Normal interatrial septum with no evidence of shunt by colorflow dopper  Normal thoracic and ascending aorta.  The patient tolerated the procedure well and was transferred back to their room in stable condition.  Signed: [name], MD [facility]

## 2023-04-10 NOTE — Anesthesia Preprocedure Evaluation (Addendum)
Anesthesia Evaluation  Patient identified by MRN, date of birth, ID band  Reviewed: Allergy & Precautions, NPO status , Patient's Chart, lab work & pertinent test results  Airway Mallampati: III      Comment: Previous grade I view with Miller 2, easy mask Dental  (+) Dental Advisory Given   Pulmonary sleep apnea and Continuous Positive Airway Pressure Ventilation , former smoker   Pulmonary exam normal breath sounds clear to auscultation       Cardiovascular hypertension, + Peripheral Vascular Disease  + dysrhythmias (prolonged QT) Atrial Fibrillation + Valvular Problems/Murmurs (mild MR and AS) MVP, MR and AS  Rhythm:Irregular Rate:Normal  HLD  TTE 04/03/2023: IMPRESSIONS     1. Left ventricular ejection fraction, by estimation, is 60 to 65%. The  left ventricle has normal function. The left ventricle has no regional  wall motion abnormalities. There is severe left ventricular hypertrophy.  Left ventricular diastolic parameters   are indeterminate.   2. Right ventricular systolic function is normal. The right ventricular  size is normal.   3. Left atrial size was moderately dilated.   4. Mild appearing MR but some splay artifact cannot r/o some posterior  leaflet prolapse in addition to MAC with anteriorly directed MR Given  bacteremia and abnormal posterior mitral annulus suggest TEE to further  assess if clnically indicated. The  mitral valve is degenerative. Mild mitral valve regurgitation. No evidence  of mitral stenosis. Moderate mitral annular calcification.   5. The aortic valve is tricuspid. There is moderate calcification of the  aortic valve. There is moderate thickening of the aortic valve. Aortic  valve regurgitation is not visualized. Mild aortic valve stenosis.   6. The inferior vena cava is dilated in size with >50% respiratory  variability, suggesting right atrial pressure of 8 mmHg.     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  On NE (5 mcg/min) and vasopressin infusions (0.02 units/min).  Last Eliquis: 7/10  INR 2.2  Lines: 7/10-c left radial a-line 7/8-c left internal jugular cvc   Reproductive/Obstetrics                              Anesthesia Physical Anesthesia Plan  ASA: 4  Anesthesia Plan: MAC   Post-op Pain Management: Minimal or no pain anticipated   Induction: Intravenous  PONV Risk Score and Plan: 1  Airway Management Planned: Natural Airway and Nasal Cannula  Additional Equipment:   Intra-op Plan:   Post-operative Plan:   Informed Consent: I have reviewed the patients History and Physical, chart, labs and discussed the procedure including the risks, benefits and alternatives for the proposed anesthesia with the patient or authorized representative who has indicated his/her understanding and acceptance.     Dental advisory given and Consent reviewed with POA (Phone consent with patient's POA, Wells Guiles, at [phone].)  Plan Discussed with: CRNA and Anesthesiologist  Anesthesia Plan Comments: (Discussed with patient risks of MAC including, but not limited to, minor pain or discomfort, hearing people in the room, and  possible need for backup general anesthesia. Risks for general anesthesia also discussed including, but not limited to, sore throat, hoarse voice, chipped/damaged teeth, injury to vocal cords, nausea and vomiting, allergic reactions, lung infection, heart attack, stroke, and death. All  questions answered.  Discussed with patient's POA that he is at high risk for anesthesia including increased risks for adverse cardiovascular events. She expressed understanding.)        Anesthesia Quick Evaluation

## 2023-04-10 NOTE — Progress Notes (Signed)
[facility] Progress Note   Subjective: seen in ICU. Less confused today.  On low dose levo and vaso gtts.   Objective Vitals:   04/07/23 1415 04/07/23 1430 04/07/23 1445 04/07/23 1500  BP: (!) 105/55 (!) 107/50 114/71 (!) 92/51  Pulse: 77 84 93 72  Resp: 20 19 18 18   Temp:      TempSrc:      SpO2: 94% 95% (!) 88% 97%  Weight:      Height:       Physical Exam General: Ill appearing man, post op is drowsy Heart:Irregularly irregular Lungs: CTA anteriorly, poor effort Abdomen: soft Extremities: 1+ BLE edema; 1+ RUE edema Dialysis Access: R AVG + bruit   Home meds - eliquis, lipitor, auryxia 3 ac tid, prns   Dialysis Orders:  MWF [name] 4[facility]  450/800     97.5kg    3K/2Ca   AVG   15g   Heparin 10,000 bolus - no ESA, Hgb > 12 - No VDRA (recently stopped d/t Ca 10.9)   Assessment/ Plan Sepsis/ aspiration pneumonia / septic joints: Currently on antibiotics. Per ID/ primary team. MSSA bacteremia: 1st set Blood Cx 7/2 positive w/ aspirate from knee also + for staph aureus.  AVG without signs of infection. ID following, remains on nafcillin. Plan for TEE.  Hypotension - on midodrine 10 tid po and IV levo gtt up to 10 micrograms/ min today.  Shoulder/Knee effusion - SP washout/ I&D of bilat shoulder and R knee 7/08 per ortho ESRD: HD is MWF. Had HD Monday night and Wed here, next HD Friday.  Volume: 5- 7 kg up by wts, +LE / UE edema, soft bp's on lowering dose of pressors. 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 Got 2gm IV Ca. Phos high, will cont binders.  Elevated LFTs - tbili up to 12, AST/ALT okay.  A-Fib  [name]  MD  [facility] 04/10/2023, 12:07 PM  Recent Labs  Lab 04/09/23 0234 04/09/23 1319 04/09/23 2323 04/10/23 0549  HGB 10.6*   < > 9.6* 9.4*  ALBUMIN <1.5*  --   --  <1.5*  CALCIUM 6.1*  --  6.9* 6.8*  6.7*  PHOS  --   --  7.0* 7.0*  CREATININE 5.07*  --  4.12* 4.54*   4.49*  K 3.9  --  3.5 3.4*  3.4*   < > = values in this interval not displayed.    Inpatient medications:  [MAR Hold] (feeding supplement) PROSource Plus  30 mL Oral BID BM   [MAR Hold] atorvastatin  10 mg Oral QHS   [MAR Hold] Chlorhexidine Gluconate Cloth  6 each Topical Q0600   [MAR Hold] Chlorhexidine Gluconate Cloth  6 each Topical Q0600   [MAR Hold] feeding supplement  237 mL Oral TID BM   [MAR Hold] ferric citrate  630 mg Oral TID WC   [MAR Hold] insulin aspart  0-9 Units Subcutaneous Q4H   [MAR Hold] midodrine  15 mg Oral TID WC   [MAR Hold] multivitamin  1 tablet Oral QHS   [MAR Hold] pantoprazole (PROTONIX) IV  40 mg Intravenous Q12H    [MAR Hold] sodium chloride     sodium chloride     [MAR Hold] anticoagulant sodium citrate     [MAR Hold]  ceFAZolin (ANCEF) IV     [MAR Hold] norepinephrine (LEVOPHED) Adult infusion 7 mcg/min (04/10/23 1206)   vasopressin  0.02 Units/min (04/10/23 1150)   Place/Maintain arterial line **AND** [MAR Hold] sodium chloride, [MAR Hold] acetaminophen, [MAR Hold] alteplase, [MAR Hold] anticoagulant sodium citrate, [MAR Hold] diltiazem, [MAR Hold] heparin, [MAR Hold] heparin, [MAR Hold] heparin, [MAR Hold] lidocaine (PF), [MAR Hold] lidocaine-prilocaine, [MAR Hold] LORazepam, [MAR Hold] mouth rinse, [MAR Hold] oxyCODONE, [MAR Hold] pentafluoroprop-tetrafluoroeth

## 2023-04-11 ENCOUNTER — Encounter (HOSPITAL_COMMUNITY): Payer: Self-pay | Admitting: Cardiology

## 2023-04-11 DIAGNOSIS — J690 Pneumonitis due to inhalation of food and vomit: Secondary | ICD-10-CM | POA: Diagnosis not present

## 2023-04-11 DIAGNOSIS — R6521 Severe sepsis with septic shock: Secondary | ICD-10-CM

## 2023-04-11 DIAGNOSIS — A419 Sepsis, unspecified organism: Secondary | ICD-10-CM | POA: Diagnosis not present

## 2023-04-11 DIAGNOSIS — B9561 Methicillin susceptible Staphylococcus aureus infection as the cause of diseases classified elsewhere: Secondary | ICD-10-CM | POA: Diagnosis not present

## 2023-04-11 DIAGNOSIS — R7881 Bacteremia: Secondary | ICD-10-CM | POA: Diagnosis not present

## 2023-04-11 DIAGNOSIS — N186 End stage renal disease: Secondary | ICD-10-CM | POA: Diagnosis not present

## 2023-04-11 LAB — RENAL FUNCTION PANEL
Albumin: <1.5 g/dL — ABNORMAL LOW (ref 3.5–5.0)
Anion gap: 14 (ref 5–15)
BUN: 48 mg/dL — ABNORMAL HIGH (ref 8–23)
CO2: 20 mmol/L — ABNORMAL LOW (ref 22–32)
Calcium: 6.6 mg/dL — ABNORMAL LOW (ref 8.9–10.3)
Chloride: 94 mmol/L — ABNORMAL LOW (ref 98–111)
Creatinine, Ser: 5.77 mg/dL — ABNORMAL HIGH (ref 0.61–1.24)
GFR, Estimated: 10 mL/min — ABNORMAL LOW (ref >60)
Glucose, Bld: 106 mg/dL — ABNORMAL HIGH (ref 70–99)
Phosphorus: 6.2 mg/dL — ABNORMAL HIGH (ref 2.5–4.6)
Potassium: 3.7 mmol/L (ref 3.5–5.1)
Sodium: 128 mmol/L — ABNORMAL LOW (ref 135–145)

## 2023-04-11 LAB — HEPARIN LEVEL (UNFRACTIONATED): Heparin Unfractionated: 0.33 IU/mL (ref 0.30–0.70)

## 2023-04-11 LAB — HEPATIC FUNCTION PANEL
ALT: 15 U/L (ref 0–44)
AST: 42 U/L — ABNORMAL HIGH (ref 15–41)
Albumin: <1.5 g/dL — ABNORMAL LOW (ref 3.5–5.0)
Alkaline Phosphatase: 81 U/L (ref 38–126)
Bilirubin, Direct: 3 mg/dL — ABNORMAL HIGH (ref 0.0–0.2)
Indirect Bilirubin: 6.1 mg/dL — ABNORMAL HIGH (ref 0.3–0.9)
Total Bilirubin: 9.1 mg/dL — ABNORMAL HIGH (ref 0.3–1.2)
Total Protein: 6.4 g/dL — ABNORMAL LOW (ref 6.5–8.1)

## 2023-04-11 LAB — CBC
HCT: 25.7 % — ABNORMAL LOW (ref 39.0–52.0)
Hemoglobin: 9.2 g/dL — ABNORMAL LOW (ref 13.0–17.0)
MCH: 33.3 pg (ref 26.0–34.0)
MCHC: 35.8 g/dL (ref 30.0–36.0)
MCV: 93.1 fL (ref 80.0–100.0)
Platelets: 145 10*3/uL — ABNORMAL LOW (ref 150–400)
RBC: 2.76 MIL/uL — ABNORMAL LOW (ref 4.22–5.81)
RDW: 16.2 % — ABNORMAL HIGH (ref 11.5–15.5)
WBC: 12.3 10*3/uL — ABNORMAL HIGH (ref 4.0–10.5)
nRBC: 0.2 % (ref 0.0–0.2)

## 2023-04-11 LAB — PROTIME-INR
INR: 1.8 — ABNORMAL HIGH (ref 0.8–1.2)
Prothrombin Time: 21 seconds — ABNORMAL HIGH (ref 11.4–15.2)

## 2023-04-11 LAB — APTT
aPTT: 131 seconds — ABNORMAL HIGH (ref 24–36)
aPTT: 133 seconds — ABNORMAL HIGH (ref 24–36)

## 2023-04-11 LAB — HEMOGLOBIN AND HEMATOCRIT, BLOOD
HCT: 26.3 % — ABNORMAL LOW (ref 39.0–52.0)
Hemoglobin: 9.4 g/dL — ABNORMAL LOW (ref 13.0–17.0)

## 2023-04-11 LAB — GLUCOSE, CAPILLARY
Glucose-Capillary: 106 mg/dL — ABNORMAL HIGH (ref 70–99)
Glucose-Capillary: 111 mg/dL — ABNORMAL HIGH (ref 70–99)
Glucose-Capillary: 130 mg/dL — ABNORMAL HIGH (ref 70–99)
Glucose-Capillary: 134 mg/dL — ABNORMAL HIGH (ref 70–99)
Glucose-Capillary: 135 mg/dL — ABNORMAL HIGH (ref 70–99)
Glucose-Capillary: 144 mg/dL — ABNORMAL HIGH (ref 70–99)
Glucose-Capillary: 147 mg/dL — ABNORMAL HIGH (ref 70–99)
Glucose-Capillary: 159 mg/dL — ABNORMAL HIGH (ref 70–99)

## 2023-04-11 LAB — AMMONIA: Ammonia: 31 umol/L (ref 9–35)

## 2023-04-11 LAB — MAGNESIUM: Magnesium: 2.2 mg/dL (ref 1.7–2.4)

## 2023-04-11 MED ORDER — APIXABAN 5 MG PO TABS
5.0000 mg | ORAL_TABLET | Freq: Two times a day (BID) | ORAL | Status: DC
  Administered 2023-04-11 – 2023-04-18 (×14): 5 mg via ORAL
  Filled 2023-04-11 (×14): qty 1

## 2023-04-11 MED ORDER — PROSOURCE PLUS PO LIQD
30.0000 mL | Freq: Three times a day (TID) | ORAL | Status: DC
  Administered 2023-04-11 – 2023-04-17 (×16): 30 mL via ORAL
  Filled 2023-04-11 (×18): qty 30

## 2023-04-11 MED ORDER — HEPARIN SODIUM (PORCINE) 1000 UNIT/ML IJ SOLN
5000.0000 [IU] | Freq: Once | INTRAMUSCULAR | Status: AC
  Administered 2023-04-11: 5000 [IU] via INTRAVENOUS
  Filled 2023-04-11: qty 5

## 2023-04-11 MED ORDER — APIXABAN 5 MG PO TABS
5.0000 mg | ORAL_TABLET | ORAL | Status: AC
  Administered 2023-04-11: 5 mg via ORAL
  Filled 2023-04-11: qty 1

## 2023-04-11 NOTE — Progress Notes (Signed)
[facility] Progress Note   Subjective: seen in ICU. Less confused today.  On low dose levo and vaso gtts.   Objective Vitals:   04/07/23 1415 04/07/23 1430 04/07/23 1445 04/07/23 1500  BP: (!) 105/55 (!) 107/50 114/71 (!) 92/51  Pulse: 77 84 93 72  Resp: 20 19 18 18   Temp:      TempSrc:      SpO2: 94% 95% (!) 88% 97%  Weight:      Height:       Physical Exam General: Ill appearing man, post op is drowsy Heart:Irregularly irregular Lungs: CTA anteriorly, poor effort Abdomen: soft Extremities: 1+ BLE edema; 1+ RUE edema Dialysis Access: R AVG + bruit   Home meds - eliquis, lipitor, auryxia 3 ac tid, prns   Dialysis Orders:  MWF [name] 4hr  450/800     97.5kg    3K/2Ca   AVG   15g   Heparin 10,000 bolus - no ESA, Hgb > 12 - No VDRA (recently stopped d/t Ca 10.9)   Assessment/ Plan Sepsis/ aspiration pneumonia / MSSA bacteremia: 1st set Blood Cx 7/2 positive w/ aspirate from knee also + for staph aureus.  AVG without signs of infection. SP washout/ I&D of bilat shoulder and R knee 7/08 per ortho.  ID following, was on nafcillin, now IV ancef. Hypotension - on midodrine 10 tid + IV levo gtt down to 1-3 ug/min Hypokalemia - w/ NSVT. K+ 3.7 today , improving ESRD: HD is MWF. HD today on schedule.  Volume: 5- 7 kg up by wts, +LE / UE edema, soft bp's on lower dose of pressors. 3rd spacing worse. Will try to get more fluid off today w/ HD.  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 SP IV Ca. Phos high, cont binders.  Elevated LFTs - tbili peaked at 12 and coming down now A-Fib  [name]  MD  [facility] 04/11/2023, 11:10 AM  Recent Labs  Lab 04/10/23 0549 04/10/23 1556 04/11/23 0314 04/11/23 0420 04/11/23 0823  HGB 9.4* 9.4* 9.2*  --  9.4*  ALBUMIN <1.5*  --  <1.5* <1.5*  --   CALCIUM 6.8*  6.7* 6.8*  --  6.6*  --   PHOS 7.0*  --   --  6.2*  --   CREATININE 4.54*  4.49* 5.04*  --  5.77*  --    K 3.4*  3.4* 3.7  --  3.7  --     Inpatient medications:  (feeding supplement) PROSource Plus  30 mL Oral BID BM   atorvastatin  10 mg Oral QHS   Chlorhexidine Gluconate Cloth  6 each Topical Q0600   Chlorhexidine Gluconate Cloth  6 each Topical Q0600   Chlorhexidine Gluconate Cloth  6 each Topical Q0600   feeding supplement  237 mL Oral TID BM   ferric citrate  630 mg Oral TID WC   heparin sodium (porcine)  5,000 Units Intravenous Once   insulin aspart  0-9 Units Subcutaneous Q4H   midodrine  15 mg Oral TID WC   multivitamin  1 tablet Oral QHS   pantoprazole (PROTONIX) IV  40 mg Intravenous Q12H    sodium chloride Stopped (04/11/23 0946)   anticoagulant sodium citrate      ceFAZolin (ANCEF) IV Stopped (04/10/23 1411)   heparin 1,000 Units/hr (04/11/23 1000)   norepinephrine (LEVOPHED) Adult infusion 2 mcg/min (04/11/23 1000)   vasopressin Stopped (04/10/23 1900)  Place/Maintain arterial line **AND** sodium chloride, acetaminophen, alteplase, anticoagulant sodium citrate, heparin, heparin, heparin, lidocaine (PF), lidocaine-prilocaine, LORazepam, mouth rinse, oxyCODONE, pentafluoroprop-tetrafluoroeth

## 2023-04-11 NOTE — Progress Notes (Signed)
PT Cancellation Note  Patient Details Name: Peter [name] MRN: [id number] DOB: January 03, 1951   Cancelled Treatment:    Reason Eval/Treat Not Completed: Patient at procedure or test/unavailable. Pt with OT earlier and now starting HD.    Angelina Ok [facility] 04/11/2023, 3:41 PM Skip Mayer PT Acute [employer] [phone]

## 2023-04-11 NOTE — Progress Notes (Signed)
Pt receives out-pt HD at [name] on MWF 5:45 am chair time. Will assist as needed.   Olivia Canter Renal Navigator [phone]

## 2023-04-11 NOTE — NC FL2 (Signed)
[place] MEDICAID FL2 LEVEL OF CARE FORM     IDENTIFICATION  Patient Name: Peter [name] Birthdate: 28-Sep-1951 Sex: male Admission Date (Current Location): 04/01/2023  [facility] and [place] Number:  Producer, television/film/video and Address:  [facility] [place], 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 04/10/2023   Sepsis (HCC) 04/06/2023   Staphylococcal arthritis of right shoulder (HCC) 04/04/2023   MSSA bacteremia 04/03/2023   Staphylococcal arthritis of right knee (HCC) 04/03/2023   Staphylococcal arthritis of right wrist (HCC) 04/03/2023   Staphylococcal arthritis of shoulder (HCC) 04/03/2023   Aspiration pneumonia (HCC) 04/01/2023   PAF (paroxysmal atrial fibrillation) (HCC) 03/18/2023   Chronic diastolic HF (heart failure) (HCC) 02/24/2023   Bradycardia 02/24/2023   Arteriovenous fistula, acquired (HCC) 03/29/2021   Chronic kidney disease due to benign hypertension 03/29/2021   Chronic kidney disease, stage 5 (HCC) 03/29/2021   Dependence on renal dialysis (HCC) 03/29/2021   Diabetic renal disease (HCC) 03/29/2021   Kidney stone 03/29/2021   Erectile dysfunction 03/29/2021   Personal history of colonic polyps 03/29/2021   Pure hypercholesterolemia 03/29/2021   Encounter for removal of sutures 09/01/2020   Headache, unspecified 05/06/2020   Allergy, unspecified, initial encounter 05/03/2019   Anaphylactic shock, unspecified, initial encounter  05/03/2019   ESRD on dialysis (HCC) 10/08/2018   Hypokalemia 06/25/2018   Unspecified protein-calorie malnutrition (HCC) 06/24/2018   Encounter for immunization 06/22/2018   Anemia in chronic kidney disease 06/08/2018   Coagulation defect, unspecified (HCC) 06/08/2018   Complication of vascular dialysis catheter 06/08/2018   Hyperparathyroidism due to renal insufficiency (HCC) 06/08/2018   Peripheral vascular disease (HCC) 06/08/2018   Iron deficiency anemia, unspecified 06/08/2018   Pruritus, unspecified 06/08/2018   Shortness of breath 06/08/2018   Type 2 diabetes mellitus with other diabetic kidney complication (HCC) 06/08/2018   Elevated troponin 05/30/2018   Elevated TSH 05/30/2018   OSA on CPAP 05/29/2018   Syncope and collapse 05/29/2018   Syncope 05/29/2018   Morbid obesity (HCC) 10/13/2013   Diastolic dysfunction 10/13/2013   MVP (mitral valve prolapse)    Obstructive sleep apnea 09/27/2013   Essential hypertension, benign 09/27/2013    Orientation RESPIRATION BLADDER Height & Weight     Self, Situation  O2 (Nasal Cannula 2 liters) Continent Weight: 233 lb 4 oz (105.8 kg) Height:  5\' 6"  (167.6 cm)  BEHAVIORAL SYMPTOMS/MOOD NEUROLOGICAL BOWEL NUTRITION STATUS      Continent Diet (Please see discharge summary)  AMBULATORY STATUS COMMUNICATION OF NEEDS Skin   Extensive Assist Verbally Other (Comment) (Jaundice,Abrasion,knee,Bil.,Blister,sacrum,Ecchymosis,Bil.scattered bruising,Erythema,Buttocks,Bil.Wound/Incis LDAs,PI coccyx,medial,deep tissue,foam lift dressing,every 3 days,Incisclosed,shoulder,L,PRN,Incis. closed,shoulder,R,PRN,Inc.Closed knee,R,PRN)                       Personal Care Assistance Level of Assistance  Bathing, Feeding, Dressing Bathing Assistance: Maximum assistance Feeding assistance: Maximum assistance Dressing Assistance: Maximum assistance     Functional Limitations Info  Sight, Hearing, Speech Sight Info: Impaired (Eyeglasses) Hearing  Info: Adequate Speech  Info: Adequate    SPECIAL CARE FACTORS FREQUENCY  PT (By licensed PT), OT (By licensed OT)     PT Frequency: 5x min weekly OT Frequency: 5x min weekly            Contractures Contractures Info: Not present    Additional Factors Info  Code Status, Allergies, Insulin Sliding Scale Code Status Info: FULL Allergies Info: Doxazosin Mesylate,Triamterene-hctz   Insulin Sliding Scale Info: insulin aspart (novoLOG) injection 0-9 Units every 4 hours       Current Medications (04/11/2023):  This is the current hospital active medication list Current Facility-Administered Medications  Medication Dose Route Frequency Provider Last Rate Last Admin   (feeding supplement) PROSource Plus liquid 30 mL  30 mL Oral BID BM Montez Morita, PA-C   30 mL at 04/11/23 1520   0.9 %  sodium chloride infusion   Intra-arterial PRN Conrad Alpine, MD   Stopped at 04/11/23 0946   acetaminophen (TYLENOL) tablet 650 mg  650 mg Oral Q6H PRN Montez Morita, PA-C   650 mg at 04/08/23 1033   alteplase (CATHFLO ACTIVASE) injection 2 mg  2 mg Intracatheter Once PRN Tomasa Blase, PA-C       anticoagulant sodium citrate solution 5 mL  5 mL Intracatheter PRN Ejigiri, Megan Mans, PA-C       apixaban (ELIQUIS) tablet 5 mg  5 mg Oral BID Selmer Dominion B, NP       atorvastatin (LIPITOR) tablet 10 mg  10 mg Oral QHS Montez Morita, PA-C   10 mg at 04/10/23 2155   ceFAZolin (ANCEF) IVPB 1 g/50 mL premix  1 g Intravenous Q24H Raymondo Band, MD   Stopped at 04/10/23 1411   Chlorhexidine Gluconate Cloth 2 % PADS 6 each  6 each Topical Q0600 Montez Morita, PA-C   6 each at 04/08/23 0500   Chlorhexidine Gluconate Cloth 2 % PADS 6 each  6 each Topical Q0600 Delano Metz, MD   6 each at 04/11/23 0600   Chlorhexidine Gluconate Cloth 2 % PADS 6 each  6 each Topical Q0600 Delano Metz, MD       feeding supplement (ENSURE ENLIVE / ENSURE PLUS) liquid 237 mL  237 mL Oral TID BM Icard, Bradley L, DO   237 mL at  04/11/23 1521   ferric citrate (AURYXIA) tablet 630 mg  630 mg Oral TID WC Montez Morita, PA-C   630 mg at 04/11/23 1247   heparin injection 1,000 Units  1,000 Units Intracatheter PRN Tomasa Blase, PA-C       heparin injection 10,000 Units  10,000 Units Dialysis PRN Tomasa Blase, PA-C   10,000 Units at 04/08/23 0701   heparin injection 2,000 Units  2,000 Units Dialysis PRN Delano Metz, MD       insulin aspart (novoLOG) injection 0-9 Units  0-9 Units Subcutaneous Q4H Selmer Dominion B, NP   1 Units at 04/11/23 1247   lidocaine (PF) (XYLOCAINE) 1 % injection 5 mL  5 mL Intradermal PRN Montez Morita, PA-C       lidocaine-prilocaine (EMLA) cream 1 Application  1 Application Topical PRN Tomasa Blase, PA-C       midodrine (PROAMATINE) tablet 15 mg  15 mg Oral TID WC Icard, Bradley L, DO   15 mg at 04/11/23 1247   multivitamin (RENA-VIT) tablet 1 tablet  1 tablet Oral QHS Montez Morita, PA-C   1 tablet at 04/10/23 2155   norepinephrine (LEVOPHED) 4mg  in (0.016 mg/mL) premix  infusion  0-40 mcg/min Intravenous Titrated Conrad Webber, MD   Stopped at 04/11/23 1201   Oral care mouth rinse  15 mL Mouth Rinse PRN Montez Morita, PA-C       oxyCODONE (Oxy IR/ROXICODONE) immediate release tablet 5 mg  5 mg Oral Q4H PRN Montez Morita, PA-C   5 mg at 04/11/23 0830   pantoprazole (PROTONIX) injection 40 mg  40 mg Intravenous Q12H Montez Morita, PA-C   40 mg at 04/11/23 1033   pentafluoroprop-tetrafluoroeth (GEBAUERS) aerosol 1 Application  1 Application Topical PRN Tomasa Blase, PA-C         Discharge Medications: Please see discharge summary for a list of discharge medications.  Relevant Imaging Results:  Relevant Lab Results:   Additional Information [id number],HD at [name] on MWF 5:45 am chair time  Delilah Shan, [address]

## 2023-04-11 NOTE — Progress Notes (Signed)
Occupational Therapy Treatment Patient Details Name: Peter [name] MRN: [id number] DOB: [birth year] Today's Date: 04/11/2023   History of present illness Pt is a 72 y.o. male admitted 04/01/23 after presenting to ED with generalized weakness, nausea, vomiting and intermittent fevers and chills since Sunday morning. Pt subsequently found have aspiration pneumonia and found to be septic with blood cultures positive for Staph aureus, BCID with MSSA. Pt further reported falling on Sunday evening, unable to get up and on the floor for a few hours, bruising and swelling noted around Left eye/face. PMH significant of ESRD on HD MWF, Type 2 DM, anemia of CKD, hypertension, paroxysmal atrial fibrillation, and hyperlipidemia.   OT comments  Patient with incremental progress toward patient focused goals.  Patient with improved bilateral shoulder AAROM, still having difficulty moving R hand and wrist.  Able to sit EOB for about 5 min with up to Max A for sitting balance.  Patient unable to scoot forward or backwards at the edge.  Max A for bed mobility.  OT will continue efforts in the acute setting to address deficits, and Patient will benefit from continued inpatient follow up therapy, <3 hours/day    Recommendations for follow up therapy are one component of a multi-disciplinary discharge planning process, led by the attending physician.  Recommendations may be updated based on patient status, additional functional criteria and insurance authorization.    Assistance Recommended at Discharge Frequent or constant Supervision/Assistance  Patient can return home with the following  Two people to help with walking and/or transfers;Two people to help with bathing/dressing/bathroom;Assistance with cooking/housework;Assistance with feeding;Direct supervision/assist for medications management;Direct supervision/assist for financial management;Assist for transportation;Help with stairs or ramp for entrance    Equipment Recommendations  None recommended by OT    Recommendations for Other Services      Precautions / Restrictions Precautions Precautions: Fall Precaution Comments: watch O2 and BP; R knee and R wrist pain and L shoulder.  A-line L forearm Restrictions Weight Bearing Restrictions: No       Mobility Bed Mobility Overal bed mobility: Needs Assistance Bed Mobility: Supine to Sit, Sit to Supine, Rolling Rolling: Max assist   Supine to sit: Max assist, HOB elevated Sit to supine: Max assist, HOB elevated        Transfers                         Balance Overall balance assessment: Needs assistance Sitting-balance support: Single extremity supported, Feet supported Sitting balance-Leahy Scale: Poor   Postural control: Posterior lean, Right lateral lean, Left lateral lean                                 ADL either performed or assessed with clinical judgement   ADL       Grooming: Moderate assistance;Bed level           Upper Body Dressing : Maximal assistance;Bed level   Lower Body Dressing: Total assistance;+2 for physical assistance;Bed level                      Extremity/Trunk Assessment Upper Extremity Assessment Upper Extremity Assessment: Generalized weakness;RUE deficits/detail;LUE deficits/detail RUE Deficits / Details: Increased swelling throughout.  Difficulty with digit ext/flex and wrist movement, improved to elbow and shoulder with AA RUE Sensation: decreased light touch RUE Coordination: decreased fine motor;decreased gross motor LUE Deficits / Details: grasp 3/5, can flex  shoulder to 120* with active assist LUE Coordination: decreased gross motor   Lower Extremity Assessment Lower Extremity Assessment: Defer to PT evaluation   Cervical / Trunk Assessment Cervical / Trunk Assessment: Kyphotic    Vision       Perception     Praxis      Cognition Arousal/Alertness: Lethargic Behavior During  Therapy: Flat affect Overall Cognitive Status: Within Functional Limits for tasks assessed                         Following Commands: Follows one step commands with increased time Safety/Judgement: Decreased awareness of safety, Decreased awareness of deficits Awareness: Emergent Problem Solving: Slow processing, Requires verbal cues General Comments: Improved alertness and better command following.        Exercises      Shoulder Instructions       General Comments      Pertinent Vitals/ Pain       Pain Assessment Pain Assessment: Faces Faces Pain Scale: Hurts even more Pain Location: generalized, most at R wrist, R knee Pain Descriptors / Indicators: Aching, Discomfort, Grimacing, Guarding Pain Intervention(s): Monitored during session                                                          Frequency  Min 2X/week        Progress Toward Goals  OT Goals(current goals can now be found in the care plan section)  Progress towards OT goals: Progressing toward goals  Acute Rehab OT Goals OT Goal Formulation: With patient Time For Goal Achievement: 04/16/23 Potential to Achieve Goals: Fair  Plan Discharge plan remains appropriate;Frequency remains appropriate    Co-evaluation                 AM-PAC OT "6 Clicks" Daily Activity     Outcome Measure   Help from another person eating meals?: A Lot Help from another person taking care of personal grooming?: A Lot Help from another person toileting, which includes using toliet, bedpan, or urinal?: Total Help from another person bathing (including washing, rinsing, drying)?: A Lot Help from another person to put on and taking off regular upper body clothing?: A Lot Help from another person to put on and taking off regular lower body clothing?: Total 6 Click Score: 10    End of Session    OT Visit Diagnosis: History of falling (Z91.81);Other abnormalities of gait and  mobility (R26.89);Muscle weakness (generalized) (M62.81);Ataxia, unspecified (R27.0);Pain Pain - Right/Left: Right Pain - part of body: Arm;Hand   Activity Tolerance Patient limited by fatigue   Patient Left in bed;with call bell/phone within reach;with family/visitor present   Nurse Communication Mobility status        Time: 8469-6295 OT Time Calculation (min): 24 min  Charges: OT General Charges $OT Visit: 1 Visit OT Treatments $Self Care/Home Management : 8-22 mins $Therapeutic Activity: 8-22 mins  04/11/2023  RP, OTR/L  Acute Rehabilitation Services  Office:  [phone]   Suzanna Obey 04/11/2023, 11:21 AM

## 2023-04-11 NOTE — Progress Notes (Signed)
NAME:  Peter [name], MRN:  [id number], DOB:  11-Sep-1951, LOS: 10 ADMISSION DATE:  04/01/2023, CONSULTATION DATE:  04/01/2023 REFERRING MD:  Dr. Thedore Mins - [facility], CHIEF COMPLAINT:   weakness/ fall/ NV   History of Present Illness:  82 yoM with PMH as below, significant for ESRD on MWF iHD via RUE AVF, afib on Eliquis, T2DM, anemia of CKD, HTN, HL, and OSA not on CPAP who presented with weakness from home. Started not to feel well on Sunday, 6/30 with nausea and vomiting. Sustained a fall, onto right knee and left shoulder pain. Admitted to [facility] on 7/2 for further workup with suspected sepsis, febrile 102.3 on admit. Blood cultures positive for MSSA bacteremia, source unclear at this time, being treated additionally for aspiration pneumonia, and mild transaminitis. Nephrology, ID, GI, and palliative care following. Ortho consulted and underwent aspiration of right knee showing many WBC, primarily PMNs. He underwent iHD 7/3 with 1.4L removed 7/3. TTE pending. Overnight, had intermittent low blood pressure episodes and patient is more sonolucent. Given concern for worsening sepsis, PCCM consulted.   Pertinent  Medical History  ESRD on MWF iHD via RUE AVF, afib on Eliquis, T2DM, anemia of CKD, HTN, HL, and OSA not on CPAP   Significant Hospital Events: Including procedures, antibiotic start and stop dates in addition to other pertinent events   7/2 admitted  7/3 iHD, right knee joint asp 7/8 to OR for I&D of right knee and bilateral shoulders.  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 rate (!) 22, height 5\' 6"  (1.676 m), weight 105.8 kg, SpO2 94%. CVP:  [5 mmHg-13 mmHg] 13 mmHg      Intake/Output Summary (Last 24 hours) at 04/11/2023 1028 Last data filed at 04/11/2023 1000 Gross per 24 hour  Intake 924.03 ml  Output 1 ml  Net 923.03 ml   Filed Weights   04/10/23 0600 04/10/23 1114 04/11/23 0600  Weight: 104.3 kg 104.3 kg 105.8 kg   Examination:  General:  chronically ill appearing older male sitting upright in bed in NAD, sleeping HEENT: MM pink/moist Neuro: arouses at times to verbal then at times to light touch but is able to answer questions appropriately, oriented x 3, MAE/ generalized weakness CV: irir, occasional PVC, rate controlled, +murmur PULM:  non labored, cleared, diminished in bases, on 2L at 94% GI: obese, soft, +bs, NT Extremities: warm/dry, +3 UE dependent edema, neg tibial edema, RUE AVF, left toe with suspected janeaway lesion 2nd toe Skin: no rashes   Labs reviewed> Na 130, K 3.4, WBC 27> 15.5, H/H stable 9.4-10.6 Net +2L afebrile  Resolved Hospital Problem list   Toxic/ metabolic encephalopathy  Assessment & Plan:  Sepsis with MSSA bacteremia with 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   - cont midodrine to 15mg  TID - likely d/c aline after iHD today to monitor BP - trending CVP, 9 today, volume removal per iHD as tolerated - ID following, appreciate input.  ABX per ID - cardiology following, medical management for now, eventual elective repair  - PMT following.  Pt and significant other confirm, remains full code currently but patient would not prolonged  life support, trach, feeding tube, etc.    Aspiration PNA OSA- untreated  P: - prn O2 for sat goal > 92% - aspiration precautions - ongoing pulmonary hygiene   ESRD on IHD MWF Anion gap metabolic acidosis, resolved P: - per Nephrology  - s/p iHD 7/10, plans for repeat iHD today  PAF  Frequent ectopy P: - remains in afib, rates controlled.  Less PVC's today - optimize electrolytes - transition back to DOAC off heparin per pharmacy - cardiology following  Hyponatremia Hyperkalemia Hypocalcemia  P: - trend BMET  Protein calorie malnutrition Hypoalbuminemia Physical deconditioning Fall -Albumin less than 1.5 P: - RD consulted, appreciate feedback - better intake per SO, monitor closely - Would not want a feeding tube/ cortrak, ever.  RD assistance appreciated to help optimize nutrition - PT  R/o GIB P:  - BM overnight, non-bloody, H/H stable - PPI    Metabolic encephalopathy, improving - more confused in AM's, remains oriented today  - MRI brain neg - minimize sedating meds  - delirium precautions - supportive care as above    Best Practice (right click and "Reselect all SmartList Selections" daily)   Diet/type: Regular consistency (see orders)> heart healthy  DVT prophylaxis: systemic heparin> DOAC GI prophylaxis: PPI Lines: Central line Foley:  N/A Code Status:  full code Last date of multidisciplinary goals of care discussion: Full code with all aggressive interventions per patient   Patient and s/o, Sarah updated at bedside 7/11, 7/12  Labs   CBC: Recent Labs  Lab 04/05/23 0315 04/06/23 0511 04/07/23 0701 04/07/23 1056 04/07/23 2054 04/08/23 0439 04/08/23 1701 04/09/23 0234 04/09/23 1319 04/09/23 2323 04/10/23 0549 04/10/23 1556 04/11/23 0314 04/11/23 0823  WBC 10.3 15.1* 16.8*   < > 18.9* 19.0*  --  27.0*  --   --  15.5*  --  12.3*  --   NEUTROABS 7.3 11.6* 12.9*  --   --  16.2*  --  25.4*  --   --   --   --   --   --   HGB 12.4*  12.6* 10.8*   < > 9.5* 9.9*   < > 10.6*   < > 9.6* 9.4* 9.4* 9.2* 9.4*  HCT 35.4* 35.0* 30.7*   < > 28.2* 28.7*   < > 30.1*   < > 27.3* 27.0* 26.4* 25.7* 26.3*  MCV 95.4 94.1 92.7   < > 96.2 96.6  --  92.6  --   --  92.5  --  93.1  --   PLT 130* 185 205   < > 196 209  --  211  --   --  172  --  145*  --    < > = values in this interval not displayed.    Basic Metabolic Panel: Recent Labs  Lab 04/07/23 1453 04/08/23 1212 04/08/23 1701 04/09/23 0234 04/09/23 2323 04/10/23 0549 04/10/23 1556 04/11/23 0314 04/11/23 0420  NA 130* 131*   < > 125* 130* 130*  130* 130*  --  128*  K 5.5* 3.4*   < > 3.9 3.5 3.4*  3.4* 3.7  --  3.7  CL 90* 94*  --  90* 90* 88*  90* 92*  --  94*  CO2 20* 25  --  21* 21* 22  22 21*  --  20*  GLUCOSE 79 94  --  112* 178* 162*  163* 164*  --  106*  BUN 98* 40*  --  47* 34* 37*  37* 42*  --  48*  CREATININE 8.13* 4.21*  --  5.07* 4.12* 4.54*  4.49* 5.04*  --  5.77*  CALCIUM 6.3* 6.5*  --  6.1* 6.9* 6.8*  6.7* 6.8*  --  6.6*  MG 2.5* 1.8  --  2.3 2.0 2.1  --  2.2  --   PHOS 11.3*  --   --   --  7.0* 7.0*  --   --  6.2*   < > = values in this interval not displayed.   GFR: Estimated Creatinine Clearance: 12.8 mL/min (A) (by C-G formula based on SCr of 5.77 mg/dL (H)). Recent Labs  Lab 04/06/23 0511 04/07/23 0701 04/07/23 1453 04/08/23 0439 04/09/23 0234 04/10/23 0549 04/11/23 0314  PROCALCITON 18.04 13.17  --  10.12 6.64  --   --   WBC 15.1* 16.8*   < > 19.0* 27.0* 15.5* 12.3*   < > = values in this interval not displayed.    Liver Function Tests: Recent Labs  Lab 04/07/23 0701 04/07/23 1453 04/08/23 0439 04/09/23 0234 04/10/23 0549 04/11/23 0314 04/11/23 0420  AST 64* 65* 57* 55*  --  42*  --   ALT 29 25 19 19   --  15  --   ALKPHOS 68 65 67 106  --  81  --   BILITOT 10.3* 11.8* 12.9* 10.4*  --  9.1*  --   PROT 6.1* 6.4* 6.5 6.8  --  6.4*  --   ALBUMIN <1.5* <1.5* <1.5* <1.5* <1.5* <1.5* <1.5*   No results for input(s): "LIPASE",  "AMYLASE" in the last 168 hours. Recent Labs  Lab 04/11/23 0823  AMMONIA 31    ABG    Component Value Date/Time   HCO3 25.1 04/08/2023 1701   TCO2 26 04/08/2023 1701   O2SAT 68 04/08/2023 1701     Coagulation Profile: Recent Labs  Lab 04/06/23 0511 04/10/23 0549 04/11/23 0314  INR 1.7* 2.2* 1.8*    Cardiac Enzymes: No results for input(s): "CKTOTAL", "CKMB", "CKMBINDEX", "TROPONINI" in the last 168 hours.  HbA1C: Hgb A1c MFr Bld  Date/Time Value Ref Range Status  05/30/2018 02:49 AM 5.7 (H) 4.8 - 5.6 % Final    Comment:    (NOTE) Pre diabetes:          5.7%-6.4% Diabetes:              >6.4% Glycemic control for   <7.0% adults with diabetes     CBG: Recent Labs  Lab 04/10/23 1949 04/10/23 2006 04/10/23 2315 04/11/23 0318 04/11/23 0820  GLUCAP 107* 104* 116* 111* 106*    Critical care time: 32 min     Posey Boyer, MSN, NP, AG-ACNP-BC [facility] 04/11/2023, 10:28 AM  See Amion for pager If no response to pager , please call [phone] until 7pm After 7:00 pm call Elink  [phone]

## 2023-04-11 NOTE — Progress Notes (Signed)
ANTICOAGULATION CONSULT NOTE - follow-up  Pharmacy Consult for heparin > apixaban Indication: atrial fibrillation  Allergies  Allergen Reactions   Doxazosin Mesylate     ineffective for BP (10/2014)   Triamterene-Hctz     increased creatinine (see 09/2014 office note)    Patient Measurements: Height: 5\' 6"  (167.6 cm) Weight: 105.8 kg (233 lb 4 oz) IBW/kg (Calculated) : 63.8 Heparin Dosing Weight: 85.6 kg  Vital Signs: Temp: 97.5 F (36.4 C) (07/12 1126) Temp Source: Oral (07/12 1126) BP: 123/41 (07/12 1502) Pulse Rate: 78 (07/12 1502)  Labs: Recent Labs    04/08/23 2210 04/09/23 0152 04/09/23 0234 04/09/23 0455 04/09/23 1319 04/10/23 0549 04/10/23 1556 04/10/23 2314 04/11/23 0314 04/11/23 0420 04/11/23 0823  HGB  --   --  10.6*  --    < > 9.4* 9.4*  --  9.2*  --  9.4*  HCT  --   --  30.1*  --    < > 27.0* 26.4*  --  25.7*  --  26.3*  PLT  --   --  211  --   --  172  --   --  145*  --   --   APTT  --   --   --   --   --   --   --  131*  --   --  133*  LABPROT  --   --   --   --   --  24.6*  --   --  21.0*  --   --   INR  --   --   --   --   --  2.2*  --   --  1.8*  --   --   HEPARINUNFRC  --   --   --   --   --   --   --  0.32  --  0.33  --   CREATININE  --   --  5.07*  --    < > 4.54*  4.49* 5.04*  --   --  5.77*  --   TROPONINIHS 121* 137*  --  156*  --   --   --   --   --   --   --    < > = values in this interval not displayed.    Estimated Creatinine Clearance: 12.8 mL/min (A) (by C-G formula based on SCr of 5.77 mg/dL (H)).   Assessment: 66 YOM w/ prior history of AF requiring heparin gtt. Last dose of apixaban given ~10 AM on 04/02/2023. Patient had fluoro guided right and left shoulder aspiration on 7/6. Plans are for joint washout this week  Heparin held with dark stools, resumed post TEE 7/11.   Heparin level this morning is 0.33.  It is possible that heparin levels are falsely low d/t elevated Tbili.  However, aPTT's may be falsely high due to  ESRD/liver dysfunction.  Pharmacy asked to change to apixaban today.  Goal of Therapy:  Heparin level 0.3-0.5 units/ml Monitor platelets by anticoagulation protocol: Yes   Plan:  Stop heparin Resume apixaban 5 mg po BID.  [facility] pharmacy phone numbers are listed on [email or web address]

## 2023-04-11 NOTE — Procedures (Signed)
HD Note:  Some information was entered later than the data was gathered due to patient care needs. The stated time with the data is accurate.  Patient received treatment at bedside  Alert and oriented to self and situation.  Patient did not speak much more than that during the treatment.   Informed consent signed and in chart.   TX duration: 3.5 hours  Patient rested with his eyes closed during treatment unless someone was speaking to him or he was being given care.  Patient BP was low and medication support was given.  Ultimately, the UF flow had to be turned off and then reduced the UF goal to 1500 ml (within the ordered volume) to continue the treatment and keep the BP WDL.  Responsive when spoken to as treatment was occurring.  Access used: Upper right arm graft Access issues: None  Total UF removed: 1500 ml  Hand-off given to patient's nurse.   Scherrie Bateman, RN [facility]

## 2023-04-11 NOTE — Progress Notes (Signed)
Nutrition Follow-up  DOCUMENTATION CODES:   Not applicable  INTERVENTION:   Continue Ensure Enlive po TID, each supplement provides 350 kcal and 20 grams of protein.  Increase 30 ml ProSource Plus TID, each supplement provides 100 kcals and 15 grams protein.   Downgrade diet to Dysphagia 3 due to poor dentition and per discussion with pt/family. Extra gravy on meal trays. Recommend fluid restriction but would not include Ensure supplements in fluid restriction at this time. Ok for family to bring in some food for pt has well  Rena-Vite daily  NUTRITION DIAGNOSIS:   Increased nutrient needs related to chronic illness (ESRD on HD) as evidenced by estimated needs.  Being addressed via supplements  GOAL:   Patient will meet greater than or equal to 90% of their needs  Progressing  MONITOR:   PO intake, Labs  REASON FOR ASSESSMENT:   Consult Assessment of nutrition requirement/status  ASSESSMENT:   Pt with hx of HLD, HTN, ESRD on HD, and DM type 2 presented to ED with weakness with nausea and vomiting. Imaging in ED worrisome for aspiration pneumonia.  7/02 Admitted 7/03 iHD, right knee joint aspirated 7/08 OR for I&D of right knee and bilateral shoulders 7/09 iHD 7/11 MRI brain negative, TEE  Pt resting on visit today, family at bedside. PO intake has been improving. For Breakfast this AM, Pt ate all of scrambled eggs, 1/4 oatmeal with brown sugar. In addition, pt drank 100% of Ensure and took Pro-Source supplement as well  Family indicates that pt actually like the food better when on the Dysphagia 3 (Mechanical Soft Diet), everything came with gravy and was much more appealing. Pt with poor dentition as well  Noted per PCCM, pt would not want a feeding tube/Cortrak ever. Plan to optimize oral intake.  EDW 97.5 kg, current wt 106 kg. +edema, anasarca, noted albumin <1.5  DTI to coccyx  Labs: sodium 128 (L), phosphorus 6.2 (H), CBGs 106-144, potassium 3.7 (wdl),  albumin <1.5 Meds: ss novolog, ferric citrate, rena-vite   Diet Order:   Diet Order             DIET DYS 3 Room service appropriate? Yes; Fluid consistency: Thin; Fluid restriction: 1500 mL Fluid  Diet effective now                   EDUCATION NEEDS:   No education needs have been identified at this time  Skin:  Skin Assessment: Skin Integrity Issues: Skin Integrity Issues:: DTI DTI: coccyx  Last BM:  7/11  Height:   Ht Readings from Last 1 Encounters:  04/10/23 5\' 6"  (1.676 m)    Weight:   Wt Readings from Last 1 Encounters:  04/11/23 105.8 kg    Ideal Body Weight:  64.5 kg  BMI:  Body mass index is 37.65 kg/m.  Estimated Nutritional Needs:   Kcal:  1800-2000 kcal/d  Protein:  90-110 g/d  Fluid:  1L+UOP  Romelle Starcher MS, RDN, LDN, CNSC Registered Dietitian 3 Clinical Nutrition RD Pager and On-Call Pager Number Located in [place]

## 2023-04-11 NOTE — Plan of Care (Signed)
  Problem: Clinical Measurements: Goal: Ability to maintain clinical measurements within normal limits will improve Outcome: Progressing   Problem: Nutrition: Goal: Adequate nutrition will be maintained Outcome: Progressing   Problem: Pain Managment: Goal: General experience of comfort will improve Outcome: Progressing   Problem: Safety: Goal: Ability to remain free from injury will improve Outcome: Progressing   

## 2023-04-11 NOTE — Progress Notes (Signed)
ANTICOAGULATION CONSULT NOTE - follow-up  Pharmacy Consult for heparin Indication: atrial fibrillation  Allergies  Allergen Reactions   Doxazosin Mesylate     ineffective for BP (10/2014)   Triamterene-Hctz     increased creatinine (see 09/2014 office note)    Patient Measurements: Height: 5\' 6"  (167.6 cm) Weight: 105.8 kg (233 lb 4 oz) IBW/kg (Calculated) : 63.8 Heparin Dosing Weight: 85.6 kg  Vital Signs: Temp: 97.5 F (36.4 C) (07/12 0819) Temp Source: Oral (07/12 0819) BP: 122/32 (07/12 1000) Pulse Rate: 79 (07/12 1030)  Labs: Recent Labs    04/08/23 2210 04/09/23 0152 04/09/23 0234 04/09/23 0455 04/09/23 1319 04/10/23 0549 04/10/23 1556 04/10/23 2314 04/11/23 0314 04/11/23 0420 04/11/23 0823  HGB  --   --  10.6*  --    < > 9.4* 9.4*  --  9.2*  --  9.4*  HCT  --   --  30.1*  --    < > 27.0* 26.4*  --  25.7*  --  26.3*  PLT  --   --  211  --   --  172  --   --  145*  --   --   APTT  --   --   --   --   --   --   --  131*  --   --  133*  LABPROT  --   --   --   --   --  24.6*  --   --  21.0*  --   --   INR  --   --   --   --   --  2.2*  --   --  1.8*  --   --   HEPARINUNFRC  --   --   --   --   --   --   --  0.32  --  0.33  --   CREATININE  --   --  5.07*  --    < > 4.54*  4.49* 5.04*  --   --  5.77*  --   TROPONINIHS 121* 137*  --  156*  --   --   --   --   --   --   --    < > = values in this interval not displayed.    Estimated Creatinine Clearance: 12.8 mL/min (A) (by C-G formula based on SCr of 5.77 mg/dL (H)).   Assessment: 27 YOM w/ prior history of AF requiring heparin gtt. Last dose of apixaban given ~10 AM on 04/02/2023. Patient had fluoro guided right and left shoulder aspiration on 7/6. Plans are for joint washout this week  Heparin held with dark stools, resumed post TEE 7/11.   Heparin level this morning is 0.33.  It is possible that heparin levels are falsely low d/t elevated Tbili.  However, aPTT's may be falsely high due to ESRD/liver  dysfunction.  Goal of Therapy:  Heparin level 0.3-0.5 units/ml Monitor platelets by anticoagulation protocol: Yes   Plan:  Continue heparin at 1000 units/h for now. If not planning CABG this admission, could Eliquis be resumed soon?  Reece Leader, Colon Flattery, [facility] Clinical Pharmacist  04/11/2023 10:46 AM   [facility] pharmacy phone numbers are listed on [email or web address]

## 2023-04-11 NOTE — Progress Notes (Signed)
Progress Note  Patient Name: Graceson Hebda Date of Encounter: 04/11/2023  Primary Cardiologist:   Rollene Rotunda, MD   Subjective   No chest pain.  No SOB.   Inpatient Medications    Scheduled Meds:  (feeding supplement) PROSource Plus  30 mL Oral BID BM   atorvastatin  10 mg Oral QHS   Chlorhexidine Gluconate Cloth  6 each Topical Q0600   Chlorhexidine Gluconate Cloth  6 each Topical Q0600   Chlorhexidine Gluconate Cloth  6 each Topical Q0600   feeding supplement  237 mL Oral TID BM   ferric citrate  630 mg Oral TID WC   heparin sodium (porcine)  5,000 Units Intravenous Once   insulin aspart  0-9 Units Subcutaneous Q4H   midodrine  15 mg Oral TID WC   multivitamin  1 tablet Oral QHS   pantoprazole (PROTONIX) IV  40 mg Intravenous Q12H   Continuous Infusions:  sodium chloride 10 mL/hr at 04/11/23 0700   anticoagulant sodium citrate      ceFAZolin (ANCEF) IV Stopped (04/10/23 1411)   heparin 1,000 Units/hr (04/11/23 0700)   norepinephrine (LEVOPHED) Adult infusion 4 mcg/min (04/11/23 0700)   vasopressin Stopped (04/10/23 1900)   PRN Meds: Place/Maintain arterial line **AND** sodium chloride, acetaminophen, alteplase, anticoagulant sodium citrate, heparin, heparin, heparin, lidocaine (PF), lidocaine-prilocaine, LORazepam, mouth rinse, oxyCODONE, pentafluoroprop-tetrafluoroeth   Vital Signs    Vitals:   04/11/23 0630 04/11/23 0645 04/11/23 0651 04/11/23 0700  BP:    (!) 106/38  Pulse: 84 82 85 89  Resp: 20 20 20  (!) 24  Temp:      TempSrc:      SpO2: 95% 95% 95% 95%  Weight:      Height:        Intake/Output Summary (Last 24 hours) at 04/11/2023 0803 Last data filed at 04/11/2023 0700 Gross per 24 hour  Intake 926.93 ml  Output 1 ml  Net 925.93 ml   Filed Weights   04/10/23 0600 04/10/23 1114 04/11/23 0600  Weight: 104.3 kg 104.3 kg 105.8 kg    Telemetry    Atrial fib/flutter with controlled rate - Personally Reviewed  ECG    NA - Personally  Reviewed  Physical Exam   GEN: Acutely ill appearing Neck: No  JVD Cardiac: IrregularRR, 3/6 apical systolic murmur, no diastolic murmurs, rubs, or gallops.  Respiratory:     Decreased breath sounds.  No crackles GI: Soft, nontender, non-distended, decreased bowel sounds MS:  Severe diffuse edema; No deformity. Neuro:   Nonfocal  Psych: Oriented and appropriate   Labs    Chemistry Recent Labs  Lab 04/08/23 0439 04/08/23 1212 04/09/23 0234 04/09/23 2323 04/10/23 0549 04/10/23 1556 04/11/23 0314 04/11/23 0420  NA  --    < > 125*   < > 130*  130* 130*  --  128*  K  --    < > 3.9   < > 3.4*  3.4* 3.7  --  3.7  CL  --    < > 90*   < > 88*  90* 92*  --  94*  CO2  --    < > 21*   < > 22  22 21*  --  20*  GLUCOSE  --    < > 112*   < > 162*  163* 164*  --  106*  BUN  --    < > 47*   < > 37*  37* 42*  --  48*  CREATININE  --    < > 5.07*   < > 4.54*  4.49* 5.04*  --  5.77*  CALCIUM  --    < > 6.1*   < > 6.8*  6.7* 6.8*  --  6.6*  PROT 6.5  --  6.8  --   --   --  6.4*  --   ALBUMIN <1.5*  --  <1.5*  --  <1.5*  --  <1.5* <1.5*  AST 57*  --  55*  --   --   --  42*  --   ALT 19  --  19  --   --   --  15  --   ALKPHOS 67  --  106  --   --   --  81  --   BILITOT 12.9*  --  10.4*  --   --   --  9.1*  --   GFRNONAA  --    < > 11*   < > 13*  13* 11*  --  10*  ANIONGAP  --    < > 14   < > 20*  18* 17*  --  14   < > = values in this interval not displayed.     Hematology Recent Labs  Lab 04/09/23 0234 04/09/23 1319 04/10/23 0549 04/10/23 1556 04/11/23 0314  WBC 27.0*  --  15.5*  --  12.3*  RBC 3.25*  --  2.92*  --  2.76*  HGB 10.6*   < > 9.4* 9.4* 9.2*  HCT 30.1*   < > 27.0* 26.4* 25.7*  MCV 92.6  --  92.5  --  93.1  MCH 32.6  --  32.2  --  33.3  MCHC 35.2  --  34.8  --  35.8  RDW 15.7*  --  15.7*  --  16.2*  PLT 211  --  172  --  145*   < > = values in this interval not displayed.    Cardiac EnzymesNo results for input(s): "TROPONINI" in the last 168 hours. No  results for input(s): "TROPIPOC" in the last 168 hours.   BNP Recent Labs  Lab 04/05/23 0315 04/06/23 0511  BNP 142.3* 183.4*     DDimer No results for input(s): "DDIMER" in the last 168 hours.   Radiology    ECHO TEE  Result Date: 04/10/2023    TRANSESOPHOGEAL ECHO REPORT   Patient Name:   SNOWDEN DAVID Suppa Date of Exam: 04/10/2023 Medical Rec #:  [id number]         Height:       66.0 in Accession #:    [id number]        Weight:       229.9 lb Date of Birth:  [birth year]         BSA:          2.122 m Patient Age:    72 years          BP:           113/50 mmHg Patient Gender: M                 HR:           84 bpm. Exam Location:  Inpatient Procedure: Transesophageal Echo, 3D Echo and Color Doppler Indications:    Endocarditis  History:        Patient has prior history of Echocardiogram examinations, most  recent 04/03/2023. Mitral Valve Disease; Signs/Symptoms:Murmur.  Sonographer:    Darlys Gales Referring Phys: [id number] LAURA R INGOLD PROCEDURE: After discussion of the risks and benefits of a TEE, an informed consent was obtained from the patient. The transesophogeal probe was passed without difficulty through the esophogus of the patient. Imaged were obtained with the patient in a supine position. Sedation performed by different physician. The patient was monitored while under deep sedation. Anesthestetic sedation was provided intravenously by Anesthesiology: 40mg  of Propofol. Image quality was good. The patient's vital signs; including heart rate, blood pressure, and oxygen saturation; remained stable throughout the procedure. The patient developed no complications during the procedure.  IMPRESSIONS  1. Left ventricular ejection fraction, by estimation, is 60 to 65%. The left ventricle has normal function. The left ventricle has no regional wall motion abnormalities.  2. Right ventricular systolic function is normal. The right ventricular size is normal.  3. Abnormal Thickened MV leaflets  with moderate mitral annular calcification. There appears to be a flail P2 scallop and very eccentric MR directed anteriorly. The MR is at least moderate to severe but no flow into the left upper PV. The other pulmonary  veins could not be identified. The MR jet wraps around the left atrium.. The mitral valve is abnormal. Moderate to severe mitral valve regurgitation. No evidence of mitral stenosis. Moderate mitral annular calcification.  4. The aortic valve is tricuspid. There is mild calcification of the aortic valve. There is mild thickening of the aortic valve. There is moderate aortic valve annular calcification.     Aortic valve regurgitation is not visualized. Mild aortic valve stenosis.  5. The inferior vena cava is normal in size with greater than 50% respiratory variability, suggesting right atrial pressure of 3 mmHg.  6. No left atrial/left atrial appendage thrombus was detected.  7. Aortic dilatation noted. There is mild dilatation of the aortic root, measuring 38 mm. There is mild (Grade II) layered plaque. Conclusion(s)/Recommendation(s): Normal biventricular function with moderate to severe mitral regurgitation due to flail P2 segment of posterior MV leaflet. The leaflet is thickened and cannot discern whether this represents myxomatous degeneration or vegetation. FINDINGS  Left Ventricle: Left ventricular ejection fraction, by estimation, is 60 to 65%. The left ventricle has normal function. The left ventricle has no regional wall motion abnormalities. The left ventricular internal cavity size was normal in size. There is  no left ventricular hypertrophy. Right Ventricle: The right ventricular size is normal. No increase in right ventricular wall thickness. Right ventricular systolic function is normal. Left Atrium: Left atrial size was normal in size. No left atrial/left atrial appendage thrombus was detected. Right Atrium: Right atrial size was normal in size. Pericardium: There is no evidence of  pericardial effusion. Mitral Valve: Abnormal Thickened MV leaflets with moderate mitral annular calcification. There appears to be a flail P2 scallop and very eccentric MR directed anteriorly. The MR is at least moderate to severe but no flow into the left upper PV. The other  pulmonary veins could not be identified. The MR jet wraps around the left atrium. The mitral valve is abnormal. Moderate mitral annular calcification. Moderate to severe mitral valve regurgitation, with eccentric anteriorly directed jet. No evidence of mitral valve stenosis. Tricuspid Valve: The tricuspid valve is normal in structure. Tricuspid valve regurgitation is not demonstrated. No evidence of tricuspid stenosis. Aortic Valve: AVA 1.64cm2 by planimetry. The aortic valve is tricuspid. There is mild calcification of the aortic valve. There is mild thickening of the aortic  valve. There is moderate aortic valve annular calcification. Aortic valve regurgitation is not  visualized. Mild aortic stenosis is present. Pulmonic Valve: The pulmonic valve was normal in structure. Pulmonic valve regurgitation is not visualized. No evidence of pulmonic stenosis. Aorta: Aortic dilatation noted. There is mild dilatation of the aortic root, measuring 38 mm. There is mild (Grade II) layered plaque. Venous: The inferior vena cava is normal in size with greater than 50% respiratory variability, suggesting right atrial pressure of 3 mmHg. IAS/Shunts: No atrial level shunt detected by color flow Doppler. Additional Comments: Spectral Doppler performed.  AORTA Ao Root diam: 3.85 cm [name] MD Electronically signed by [name] MD Signature Date/Time: 04/10/2023/1:44:17 PM    Final    EP STUDY  Result Date: 04/10/2023 See surgical note for result.  MR BRAIN WO CONTRAST  Result Date: 04/10/2023 CLINICAL DATA:  72 year old male with staph aureus bacteremia. Confusion and altered mental status. Query CNS infection. EXAM: MRI HEAD WITHOUT CONTRAST  TECHNIQUE: Multiplanar, multiecho pulse sequences of the brain and surrounding structures were obtained without intravenous contrast. COMPARISON:  Head CT 04/01/2023. Cervical spine MRI 04/03/2023. FINDINGS: Study is intermittently degraded by motion artifact despite repeated imaging attempts. Brain: No restricted diffusion to suggest acute infarction. No midline shift, mass effect, evidence of mass lesion, ventriculomegaly, extra-axial collection or acute intracranial hemorrhage. Cervicomedullary junction and pituitary are within normal limits. No evidence of intraventricular debris. No evidence of chronic cerebral blood products or microhemorrhage on SWI. Other axial imaging demonstrates normal for age gray and white matter signal throughout the brain. No cerebral edema. No cortical encephalomalacia. Deep gray nuclei, brainstem and cerebellum appear negative. Vascular: Major intracranial vascular flow voids are preserved. Skull and upper cervical spine: Cervical spine detail today is obscured by motion. Visualized bone marrow signal is within normal limits. Sinuses/Orbits: Postoperative changes to both globes. Scattered mild or at most moderate paranasal sinus mucosal thickening. No sinus fluid levels. Other: Trace right mastoid fluid. Grossly normal visible internal auditory structures. IMPRESSION: Negative for age noncontrast MRI appearance of the Brain when allowing for motion artifact. Electronically Signed   By: Odessa Fleming M.D.   On: 04/10/2023 05:15    Cardiac Studies   Echo:  1. Left ventricular ejection fraction, by estimation, is 60 to 65%. The  left ventricle has normal function. The left ventricle has no regional  wall motion abnormalities. There is severe left ventricular hypertrophy.  Left ventricular diastolic parameters   are indeterminate.   2. Right ventricular systolic function is normal. The right ventricular  size is normal.   3. Left atrial size was moderately dilated.   4. Mild  appearing MR but some splay artifact cannot r/o some posterior  leaflet prolapse in addition to MAC with anteriorly directed MR Given  bacteremia and abnormal posterior mitral annulus suggest TEE to further  assess if clnically indicated. The  mitral valve is degenerative. Mild mitral valve regurgitation. No evidence  of mitral stenosis. Moderate mitral annular calcification.   5. The aortic valve is tricuspid. There is moderate calcification of the  aortic valve. There is moderate thickening of the aortic valve. Aortic  valve regurgitation is not visualized. Mild aortic valve stenosis.   6. The inferior vena cava is dilated in size with >50% respiratory  variability, suggesting right atrial pressure of 8 mmHg.    TEE:   1. Left ventricular ejection fraction, by estimation, is 60 to 65%. The  left ventricle has normal function. The left ventricle has no  regional  wall motion abnormalities.   2. Right ventricular systolic function is normal. The right ventricular  size is normal.   3. Abnormal Thickened MV leaflets with moderate mitral annular  calcification. There appears to be a flail P2 scallop and very eccentric  MR directed anteriorly. The MR is at least moderate to severe but no flow  into the left upper PV. The other pulmonary   veins could not be identified. The MR jet wraps around the left atrium..  The mitral valve is abnormal. Moderate to severe mitral valve  regurgitation. No evidence of mitral stenosis. Moderate mitral annular  calcification.   4. The aortic valve is tricuspid. There is mild calcification of the  aortic valve. There is mild thickening of the aortic valve. There is  moderate aortic valve annular calcification.      Aortic valve regurgitation is not visualized. Mild aortic valve  stenosis.   5. The inferior vena cava is normal in size with greater than 50%  respiratory variability, suggesting right atrial pressure of 3 mmHg.   6. No left atrial/left  atrial appendage thrombus was detected.   7. Aortic dilatation noted. There is mild dilatation of the aortic root,  measuring 38 mm. There is mild (Grade II) layered plaque.  Patient Profile     72 y.o. male with a hx of ESRD on HD MWF, type 2 dM, anemia of CKD, HTN, HLD, Mild MR who is being seen 04/04/2023 for the evaluation of atrial fibrillation, possible endocarditis at the request of Dr. Thedore Mins    Assessment & Plan    MSSA bacteremia/septic arthritis:  Possible endocarditis by current criteria.  Although TEE did not demonstrate an obvious large vegetation although small veg could not be excluded or abscess this still fits with endocarditis.  However, there does not appear to be the need to consider urgent high risk surgery.  He has a flail leaflet as described above and would eventually need repair electively.  Continue antibiotics per ID/primary team with supportive care.    Atrial fib:    On heparin.   Held secondary to blood seen in the stool.  I am not suggesting amiodarone at this point.   Rate has not been out of proportion to the clinical scenario.    MR:  See above.   Flail P2.  Med management for now.   NSVT:  Potassium supplemented.   Sepsis:  Per CCM.     For questions or updates, please contact [facility] Please consult [email or web address] for contact info under Cardiology/STEMI.   Signed, Rollene Rotunda, MD  04/11/2023, 8:03 AM

## 2023-04-11 NOTE — Progress Notes (Signed)
Patient ID: Peter [name], male   DOB: 30-Apr-1951, 72 y.o.   MRN: [id number]    Progress Note from the Palliative Medicine Team at [facility]   Patient Name: Peter [name]        Date: 04/11/2023 DOB: Jul 11, 1951  Age: 72 y.o. MRN#: [id number] Attending Physician: Josephine Igo, DO Primary Care Physician: [name], MD Admit Date: 04/01/2023    Extensive chart review has been completed prior to meeting with patient/family  including labs, vital signs, imaging, progress/consult notes, orders, medications and available advance directive documents.   66 yoM with PMH as below, significant for ESRD on MWF iHD via RUE AVF, afib on Eliquis, T2DM, anemia of CKD, HTN, HL, and OSA not on CPAP who presented with weakness from home. Started not to feel well on Sunday, 6/30 with nausea and vomiting. Sustained a fall, onto right knee and left shoulder pain. Admitted to [facility] on 7/2 for further workup with suspected sepsis, febrile 102.3 on admit. Blood cultures positive for MSSA bacteremia, source unclear at this time, being treated additionally for aspiration pneumonia, and mild transaminitis. Nephrology, ID, GI, and palliative care following. Ortho consulted and underwent aspiration of right knee showing many WBC, primarily PMNs. He underwent iHD 7/3 with 1.4L removed 7/3. TTE pending. Overnight, had intermittent low blood pressure episodes and patient is more sonolucent. Transferred to ICU   This NP assessed patient at the bedside as a follow up for palliative medicine needs and emotional support.  Significant Other/ Peter [name] at bedside   Patient remains lethargic, he is oriented x 3.  Continued education and emotional support regarding patient's multiple co-morbidities; end-stage renal disease/dialysis dependent, sepsis with MRSA bacteremia, protein calorie malnutrition and physical deconditioning.  Plan is to continue to treat the treatable, all are hopeful for improvement.     Education  offered today regarding  the importance of continued conversation with family and their  medical providers regarding overall plan of care and treatment options,  ensuring decisions are within the context of the patients values and GOCs.  Questions and concerns addressed     Time: 25 minutes  Detailed review of medical records ( labs, imaging, vital signs), medically appropriate exam ( MS, skin, resp)   discussed with treatment team, counseling and education to patient, family, staff, documenting clinical information, medication management, coordination of care    Lorinda Creed NP  Palliative Medicine Team Team Phone # [phone] Pager [phone]

## 2023-04-12 ENCOUNTER — Inpatient Hospital Stay (HOSPITAL_COMMUNITY): Payer: Medicare Other

## 2023-04-12 DIAGNOSIS — B9561 Methicillin susceptible Staphylococcus aureus infection as the cause of diseases classified elsewhere: Secondary | ICD-10-CM | POA: Diagnosis not present

## 2023-04-12 DIAGNOSIS — R7881 Bacteremia: Secondary | ICD-10-CM | POA: Diagnosis not present

## 2023-04-12 DIAGNOSIS — I340 Nonrheumatic mitral (valve) insufficiency: Secondary | ICD-10-CM

## 2023-04-12 LAB — CBC
HCT: 23.8 % — ABNORMAL LOW (ref 39.0–52.0)
Hemoglobin: 8.7 g/dL — ABNORMAL LOW (ref 13.0–17.0)
MCH: 33.2 pg (ref 26.0–34.0)
MCHC: 36.6 g/dL — ABNORMAL HIGH (ref 30.0–36.0)
MCV: 90.8 fL (ref 80.0–100.0)
Platelets: 122 10*3/uL — ABNORMAL LOW (ref 150–400)
RBC: 2.62 MIL/uL — ABNORMAL LOW (ref 4.22–5.81)
RDW: 16 % — ABNORMAL HIGH (ref 11.5–15.5)
WBC: 17.5 10*3/uL — ABNORMAL HIGH (ref 4.0–10.5)
nRBC: 0 % (ref 0.0–0.2)

## 2023-04-12 LAB — DIC (DISSEMINATED INTRAVASCULAR COAGULATION)PANEL
D-Dimer, Quant: 9.28 ug/mL-FEU — ABNORMAL HIGH (ref 0.00–0.50)
Fibrinogen: 799 mg/dL — ABNORMAL HIGH (ref 210–475)
INR: 1.6 — ABNORMAL HIGH (ref 0.8–1.2)
Platelets: 113 10*3/uL — ABNORMAL LOW (ref 150–400)
Prothrombin Time: 19.3 seconds — ABNORMAL HIGH (ref 11.4–15.2)
Smear Review: NONE SEEN
aPTT: 46 seconds — ABNORMAL HIGH (ref 24–36)

## 2023-04-12 LAB — GLUCOSE, CAPILLARY
Glucose-Capillary: 111 mg/dL — ABNORMAL HIGH (ref 70–99)
Glucose-Capillary: 154 mg/dL — ABNORMAL HIGH (ref 70–99)
Glucose-Capillary: 165 mg/dL — ABNORMAL HIGH (ref 70–99)
Glucose-Capillary: 158 mg/dL — ABNORMAL HIGH (ref 70–99)
Glucose-Capillary: 150 mg/dL — ABNORMAL HIGH (ref 70–99)

## 2023-04-12 LAB — RENAL FUNCTION PANEL
Albumin: <1.5 g/dL — ABNORMAL LOW (ref 3.5–5.0)
Anion gap: 12 (ref 5–15)
BUN: 29 mg/dL — ABNORMAL HIGH (ref 8–23)
CO2: 24 mmol/L (ref 22–32)
Calcium: 7.2 mg/dL — ABNORMAL LOW (ref 8.9–10.3)
Chloride: 92 mmol/L — ABNORMAL LOW (ref 98–111)
Creatinine, Ser: 4.29 mg/dL — ABNORMAL HIGH (ref 0.61–1.24)
GFR, Estimated: 14 mL/min — ABNORMAL LOW (ref >60)
Glucose, Bld: 116 mg/dL — ABNORMAL HIGH (ref 70–99)
Phosphorus: 4.2 mg/dL (ref 2.5–4.6)
Potassium: 3.6 mmol/L (ref 3.5–5.1)
Sodium: 128 mmol/L — ABNORMAL LOW (ref 135–145)

## 2023-04-12 LAB — AEROBIC/ANAEROBIC CULTURE W GRAM STAIN (SURGICAL/DEEP WOUND)

## 2023-04-12 LAB — MAGNESIUM: Magnesium: 1.8 mg/dL (ref 1.7–2.4)

## 2023-04-12 MED ORDER — AMIODARONE HCL IN DEXTROSE 360-4.14 MG/200ML-% IV SOLN
30.0000 mg/h | INTRAVENOUS | Status: DC
  Administered 2023-04-12 – 2023-04-14 (×4): 30 mg/h via INTRAVENOUS
  Filled 2023-04-12 (×4): qty 200

## 2023-04-12 MED ORDER — AMIODARONE HCL IN DEXTROSE 360-4.14 MG/200ML-% IV SOLN
60.0000 mg/h | INTRAVENOUS | Status: AC
  Administered 2023-04-12: 60 mg/h via INTRAVENOUS
  Filled 2023-04-12: qty 200

## 2023-04-12 NOTE — Progress Notes (Signed)
NAME:  Peter [name], MRN:  [id number], DOB:  1951-08-18, LOS: 11 ADMISSION DATE:  04/01/2023, CONSULTATION DATE:  04/01/2023 REFERRING MD:  Dr. Thedore Mins - [facility], CHIEF COMPLAINT:   weakness/ fall/ NV   History of Present Illness:  60 yoM with PMH as below, significant for ESRD on MWF iHD via RUE AVF, afib on Eliquis, T2DM, anemia of CKD, HTN, HL, and OSA not on CPAP who presented with weakness from home. Started not to feel well on Sunday, 6/30 with nausea and vomiting. Sustained a fall, onto right knee and left shoulder pain. Admitted to [facility] on 7/2 for further workup with suspected sepsis, febrile 102.3 on admit. Blood cultures positive for MSSA bacteremia, source unclear at this time, being treated additionally for aspiration pneumonia, and mild transaminitis. Nephrology, ID, GI, and palliative care following. Ortho consulted and underwent aspiration of right knee showing many WBC, primarily PMNs. He underwent iHD 7/3 with 1.4L removed 7/3. TTE pending. Overnight, had intermittent low blood pressure episodes and patient is more sonolucent. Given concern for worsening sepsis, PCCM consulted.   Pertinent  Medical History  ESRD on MWF iHD via RUE AVF, afib on Eliquis, T2DM, anemia of CKD, HTN, HL, and OSA not on CPAP   Significant Hospital Events: Including procedures, antibiotic start and stop dates in addition to other pertinent events   7/2 admitted  7/3 iHD, right knee joint asp 7/8 to OR for I&D of right knee and bilateral shoulders.  7/9 iHD, eliquis restarted  7/10 poor PO intake, increased midodrine, iHD UF 1.5L, eliquis held for concern of bloody stools, WBC up/ afebrile 7/11 MRI brain neg, nafcillin changed to cefazolin, TEE  Interim History / Subjective:  On higher pressors after HD yesterday. Denies pain. Remains in AF w/ frequent PVCs  Objective   Blood pressure (!) 153/46, pulse 90, temperature 98.4 F (36.9 C), temperature source Oral, resp. rate 20, height 5\' 6"  (1.676 m),  weight 105.8 kg, SpO2 95%. CVP:  [7 mmHg-19 mmHg] 11 mmHg      Intake/Output Summary (Last 24 hours) at 04/12/2023 0905 Last data filed at 04/12/2023 0800 Gross per 24 hour  Intake 875.33 ml  Output 1500 ml  Net -624.67 ml   Filed Weights   04/10/23 0600 04/10/23 1114 04/11/23 0600  Weight: 104.3 kg 104.3 kg 105.8 kg   Examination:  Chronically ill appearing Diffuse anasarca Moves to command but weak Oriented, answers questions appropriately Abd soft, hypoactive BS No rashes, skin is jaundiced  Ammonia norm Stable mixed direct/indirect hyperbili Plts dropping WBC up slightly Afebrile Coagulopathy noted  Resolved Hospital Problem list   Toxic/ metabolic encephalopathy Aspiration PNA  Assessment & Plan:  Sepsis with MSSA bacteremia with 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 including bilateral shoulders, right knee and right wrist- s/p washout in OR 7/8 with ortho  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  ESRD on IHD MWF Physicial deconditioning, severe, acute on chronic Moderate relative protein calorie malnutrition Hyperbilirubinemia, thrombocytoepnia, anemia- question hemolysis, I guess could be biliary stasis; not POA nor was it present when eval'd by GI PAF, frequent ectopy- ongoing, on eliquis Intermittent possible GIB- no transfusion needs at present  - Start amio no bolus, continue NoAC - Check RUQ, DIC panel, med review to see if anything causes biliary stasis - Continue cefazolin, duration TBD - Continue midodrine, levophed for MAP 65 - iHD per nephrology - Unna boots to mobilize extracellular fluid and may improve preload - Encourage PO, OOB - Likely will  need eventual surgical fix for mitral valve if we can get him a better operative candidate  Best Practice (right click and "Reselect all SmartList Selections" daily)   Diet/type: Regular consistency (see orders)> heart healthy  DVT prophylaxis: systemic heparin> DOAC GI  prophylaxis: PPI Lines: Central line Foley:  N/A Code Status:  full code Last date of multidisciplinary goals of care discussion: Full code with all aggressive interventions per patient   Patient and s/o, [name] updated at bedside 7/11, 7/12  42 min cc time Myrla Halsted MD PCCM

## 2023-04-12 NOTE — Plan of Care (Signed)
, °

## 2023-04-12 NOTE — Progress Notes (Signed)
[facility] Progress Note   Subjective: seen in ICU. Less confused today.  On low dose levo and vaso gtts.   Objective Vitals:   04/07/23 1415 04/07/23 1430 04/07/23 1445 04/07/23 1500  BP: (!) 105/55 (!) 107/50 114/71 (!) 92/51  Pulse: 77 84 93 72  Resp: 20 19 18 18   Temp:      TempSrc:      SpO2: 94% 95% (!) 88% 97%  Weight:      Height:       Physical Exam General: Ill appearing man, post op is drowsy Heart:Irregularly irregular Lungs: CTA anteriorly, poor effort Abdomen: soft Extremities: 1+ BLE edema; 1+ RUE edema Dialysis Access: R AVG + bruit   Home meds - eliquis, lipitor, auryxia 3 ac tid, prns   Dialysis Orders:  MWF [name] 4hr  450/800     97.5kg    3K/2Ca   AVG   15g   Heparin 10,000 bolus - no ESA, Hgb > 12 - No VDRA (recently stopped d/t Ca 10.9)   Assessment/ Plan Sepsis/ aspiration pneumonia / MSSA bacteremia: 1st set Blood Cx 7/2 positive w/ aspirate from knee also + for staph aureus.  AVG without signs of infection. SP washout/ I&D of bilat shoulder and R knee 7/08 per ortho.  ID following, was on nafcillin, now IV ancef. Hypotension - midodrine 10 tid + levo gtt at 1- 7 micrograms/min Hypokalemia - w/ NSVT. K+ 3.7 today , improving ESRD: HD is MWF. Next HD Monday. Would consider extra HD this weekend but currently do not have the staff.  Volume: vol overload continues to worsen. +LE / UE edema, +7kg over dry wt. Hypotension limiting UF on HD.  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 SP IV Ca. Phos high, cont binders.  Severe MR - flail MV leaflet, could not exclude small vegetation by echo/TEE. Pt is not candidate for repair/ replacement at this time per cardiology.  Elevated LFTs - tbili peaked at 12 and coming down now A-Fib  [name]  MD  [facility] 04/12/2023, 1:38 PM  Recent Labs  Lab 04/11/23 0420 04/11/23 0823 04/12/23 0400  HGB  --  9.4* 8.7*  ALBUMIN <1.5*  --   <1.5*  CALCIUM 6.6*  --  7.2*  PHOS 6.2*  --  4.2  CREATININE 5.77*  --  4.29*  K 3.7  --  3.6    Inpatient medications:  (feeding supplement) PROSource Plus  30 mL Oral TID BM   apixaban  5 mg Oral BID   atorvastatin  10 mg Oral QHS   Chlorhexidine Gluconate Cloth  6 each Topical Q0600   Chlorhexidine Gluconate Cloth  6 each Topical Q0600   Chlorhexidine Gluconate Cloth  6 each Topical Q0600   feeding supplement  237 mL Oral TID BM   ferric citrate  630 mg Oral TID WC   insulin aspart  0-9 Units Subcutaneous Q4H   midodrine  15 mg Oral TID WC   multivitamin  1 tablet Oral QHS   pantoprazole (PROTONIX) IV  40 mg Intravenous Q12H    sodium chloride Stopped (04/11/23 0946)   amiodarone 60 mg/hr (04/12/23 1300)   amiodarone     anticoagulant sodium citrate      ceFAZolin (ANCEF) IV 1 g (04/12/23 1334)   norepinephrine (LEVOPHED) Adult infusion Stopped (04/12/23 1145)   Place/Maintain arterial line **AND** sodium chloride, acetaminophen, alteplase, anticoagulant sodium citrate,  heparin, lidocaine (PF), lidocaine-prilocaine, mouth rinse, oxyCODONE, pentafluoroprop-tetrafluoroeth

## 2023-04-12 NOTE — Progress Notes (Signed)
Rounding Note    Patient Name: Peter [name] Date of Encounter: 04/12/2023  [facility] Cardiologist: Rollene Rotunda, MD   Subjective   72 yo with hx of ESRD, poor dentition who was admitted with generalized weakness and illness for the past several months.  He ended up falling and was found on the floor. He has persistent atrial fibrillation, mitral valve regurgitation, hypertension, hyperlipidemia.  He has Staph aureus bacteremia.  He has been found to have an infected knee.  TEE reveals a flail mitral valve leaflet with moderate to severe mitral regurgitation.  He is extremely weak and at present is not a candidate for mitral valve repair or replacement.  Sharlotte Alamo, CMA at [facility] is a family friend who is in the room and helped with the discussion   Inpatient Medications    Scheduled Meds:  (feeding supplement) PROSource Plus  30 mL Oral TID BM   apixaban  5 mg Oral BID   atorvastatin  10 mg Oral QHS   Chlorhexidine Gluconate Cloth  6 each Topical Q0600   Chlorhexidine Gluconate Cloth  6 each Topical Q0600   Chlorhexidine Gluconate Cloth  6 each Topical Q0600   feeding supplement  237 mL Oral TID BM   ferric citrate  630 mg Oral TID WC   insulin aspart  0-9 Units Subcutaneous Q4H   midodrine  15 mg Oral TID WC   multivitamin  1 tablet Oral QHS   pantoprazole (PROTONIX) IV  40 mg Intravenous Q12H   Continuous Infusions:  sodium chloride Stopped (04/11/23 0946)   amiodarone 60 mg/hr (04/12/23 1100)   amiodarone     anticoagulant sodium citrate      ceFAZolin (ANCEF) IV Stopped (04/11/23 2027)   norepinephrine (LEVOPHED) Adult infusion 2 mcg/min (04/12/23 1100)   PRN Meds: Place/Maintain arterial line **AND** sodium chloride, acetaminophen, alteplase, anticoagulant sodium citrate, heparin, lidocaine (PF), lidocaine-prilocaine, mouth rinse, oxyCODONE, pentafluoroprop-tetrafluoroeth   Vital Signs    Vitals:   04/12/23 1015 04/12/23 1030  04/12/23 1045 04/12/23 1100  BP:      Pulse: 90 92 92 90  Resp: (!) 23 17 (!) 24 (!) 23  Temp:      TempSrc:      SpO2: 94% 95% 95% 94%  Weight:      Height:        Intake/Output Summary (Last 24 hours) at 04/12/2023 1105 Last data filed at 04/12/2023 1100 Gross per 24 hour  Intake 1138.66 ml  Output 1500 ml  Net -361.34 ml      04/11/2023    6:00 AM 04/10/2023   11:14 AM 04/10/2023    6:00 AM  Last 3 Weights  Weight (lbs) 233 lb 4 oz 229 lb 15 oz 229 lb 15 oz  Weight (kg) 105.8 kg 104.3 kg 104.3 kg      Telemetry    Atrial fibrillation with rapid ventricular response.- Personally Reviewed  ECG     - Personally Reviewed  Physical Exam   GEN: Elderly gentleman.  Appears older than stated age.  He is extremely weak.  Very poor dentition..   Neck: No JVD Cardiac: Irregularly irregular.  2/6 to 3/6 systolic ejection murmur. Respiratory: Clear to auscultation bilaterally. GI: Soft, nontender, non-distended  MS: No edema; No deformity. Neuro: Extremely weak, Psych: Normal affect   Labs    High Sensitivity Troponin:   Recent Labs  Lab 04/08/23 1915 04/08/23 2210 04/09/23 0152 04/09/23 0455  TROPONINIHS 127* 121* 137* 156*  Chemistry Recent Labs  Lab 04/08/23 0439 04/08/23 1212 04/09/23 0234 04/09/23 2323 04/10/23 0549 04/10/23 1556 04/11/23 0314 04/11/23 0420 04/12/23 0400  NA  --    < > 125*   < > 130*  130* 130*  --  128* 128*  K  --    < > 3.9   < > 3.4*  3.4* 3.7  --  3.7 3.6  CL  --    < > 90*   < > 88*  90* 92*  --  94* 92*  CO2  --    < > 21*   < > 22  22 21*  --  20* 24  GLUCOSE  --    < > 112*   < > 162*  163* 164*  --  106* 116*  BUN  --    < > 47*   < > 37*  37* 42*  --  48* 29*  CREATININE  --    < > 5.07*   < > 4.54*  4.49* 5.04*  --  5.77* 4.29*  CALCIUM  --    < > 6.1*   < > 6.8*  6.7* 6.8*  --  6.6* 7.2*  MG  --    < > 2.3   < > 2.1  --  2.2  --  1.8  PROT 6.5  --  6.8  --   --   --  6.4*  --   --   ALBUMIN <1.5*  --   <1.5*  --  <1.5*  --  <1.5* <1.5* <1.5*  AST 57*  --  55*  --   --   --  42*  --   --   ALT 19  --  19  --   --   --  15  --   --   ALKPHOS 67  --  106  --   --   --  81  --   --   BILITOT 12.9*  --  10.4*  --   --   --  9.1*  --   --   GFRNONAA  --    < > 11*   < > 13*  13* 11*  --  10* 14*  ANIONGAP  --    < > 14   < > 20*  18* 17*  --  14 12   < > = values in this interval not displayed.    Lipids No results for input(s): "CHOL", "TRIG", "HDL", "LABVLDL", "LDLCALC", "CHOLHDL" in the last 168 hours.  Hematology Recent Labs  Lab 04/10/23 0549 04/10/23 1556 04/11/23 0314 04/11/23 0823 04/12/23 0400  WBC 15.5*  --  12.3*  --  17.5*  RBC 2.92*  --  2.76*  --  2.62*  HGB 9.4*   < > 9.2* 9.4* 8.7*  HCT 27.0*   < > 25.7* 26.3* 23.8*  MCV 92.5  --  93.1  --  90.8  MCH 32.2  --  33.3  --  33.2  MCHC 34.8  --  35.8  --  36.6*  RDW 15.7*  --  16.2*  --  16.0*  PLT 172  --  145*  --  122*   < > = values in this interval not displayed.   Thyroid  Recent Labs  Lab 04/06/23 0511  TSH 1.040    BNP Recent Labs  Lab 04/06/23 0511  BNP 183.4*    DDimer No results for input(s): "DDIMER" in the  last 168 hours.   Radiology    ECHO TEE  Result Date: 04/10/2023    TRANSESOPHOGEAL ECHO REPORT   Patient Name:   JONTA DAVID Lemanski Date of Exam: 04/10/2023 Medical Rec #:  [id number]         Height:       66.0 in Accession #:    [id number]        Weight:       229.9 lb Date of Birth:  28-Jun-1951         BSA:          2.122 m Patient Age:    72 years          BP:           113/50 mmHg Patient Gender: M                 HR:           84 bpm. Exam Location:  Inpatient Procedure: Transesophageal Echo, 3D Echo and Color Doppler Indications:    Endocarditis  History:        Patient has prior history of Echocardiogram examinations, most                 recent 04/03/2023. Mitral Valve Disease; Signs/Symptoms:Murmur.  Sonographer:    Darlys Gales Referring Phys: [id number] LAURA R INGOLD PROCEDURE: After discussion  of the risks and benefits of a TEE, an informed consent was obtained from the patient. The transesophogeal probe was passed without difficulty through the esophogus of the patient. Imaged were obtained with the patient in a supine position. Sedation performed by different physician. The patient was monitored while under deep sedation. Anesthestetic sedation was provided intravenously by Anesthesiology: 40mg  of Propofol. Image quality was good. The patient's vital signs; including heart rate, blood pressure, and oxygen saturation; remained stable throughout the procedure. The patient developed no complications during the procedure.  IMPRESSIONS  1. Left ventricular ejection fraction, by estimation, is 60 to 65%. The left ventricle has normal function. The left ventricle has no regional wall motion abnormalities.  2. Right ventricular systolic function is normal. The right ventricular size is normal.  3. Abnormal Thickened MV leaflets with moderate mitral annular calcification. There appears to be a flail P2 scallop and very eccentric MR directed anteriorly. The MR is at least moderate to severe but no flow into the left upper PV. The other pulmonary  veins could not be identified. The MR jet wraps around the left atrium.. The mitral valve is abnormal. Moderate to severe mitral valve regurgitation. No evidence of mitral stenosis. Moderate mitral annular calcification.  4. The aortic valve is tricuspid. There is mild calcification of the aortic valve. There is mild thickening of the aortic valve. There is moderate aortic valve annular calcification.     Aortic valve regurgitation is not visualized. Mild aortic valve stenosis.  5. The inferior vena cava is normal in size with greater than 50% respiratory variability, suggesting right atrial pressure of 3 mmHg.  6. No left atrial/left atrial appendage thrombus was detected.  7. Aortic dilatation noted. There is mild dilatation of the aortic root, measuring 38 mm. There  is mild (Grade II) layered plaque. Conclusion(s)/Recommendation(s): Normal biventricular function with moderate to severe mitral regurgitation due to flail P2 segment of posterior MV leaflet. The leaflet is thickened and cannot discern whether this represents myxomatous degeneration or vegetation. FINDINGS  Left Ventricle: Left ventricular ejection fraction, by estimation, is 60 to 65%. The left ventricle  has normal function. The left ventricle has no regional wall motion abnormalities. The left ventricular internal cavity size was normal in size. There is  no left ventricular hypertrophy. Right Ventricle: The right ventricular size is normal. No increase in right ventricular wall thickness. Right ventricular systolic function is normal. Left Atrium: Left atrial size was normal in size. No left atrial/left atrial appendage thrombus was detected. Right Atrium: Right atrial size was normal in size. Pericardium: There is no evidence of pericardial effusion. Mitral Valve: Abnormal Thickened MV leaflets with moderate mitral annular calcification. There appears to be a flail P2 scallop and very eccentric MR directed anteriorly. The MR is at least moderate to severe but no flow into the left upper PV. The other  pulmonary veins could not be identified. The MR jet wraps around the left atrium. The mitral valve is abnormal. Moderate mitral annular calcification. Moderate to severe mitral valve regurgitation, with eccentric anteriorly directed jet. No evidence of mitral valve stenosis. Tricuspid Valve: The tricuspid valve is normal in structure. Tricuspid valve regurgitation is not demonstrated. No evidence of tricuspid stenosis. Aortic Valve: AVA 1.64cm2 by planimetry. The aortic valve is tricuspid. There is mild calcification of the aortic valve. There is mild thickening of the aortic valve. There is moderate aortic valve annular calcification. Aortic valve regurgitation is not  visualized. Mild aortic stenosis is present.  Pulmonic Valve: The pulmonic valve was normal in structure. Pulmonic valve regurgitation is not visualized. No evidence of pulmonic stenosis. Aorta: Aortic dilatation noted. There is mild dilatation of the aortic root, measuring 38 mm. There is mild (Grade II) layered plaque. Venous: The inferior vena cava is normal in size with greater than 50% respiratory variability, suggesting right atrial pressure of 3 mmHg. IAS/Shunts: No atrial level shunt detected by color flow Doppler. Additional Comments: Spectral Doppler performed.  AORTA Ao Root diam: 3.85 cm [name] MD Electronically signed by [name] MD Signature Date/Time: 04/10/2023/1:44:17 PM    Final    EP STUDY  Result Date: 04/10/2023 See surgical note for result.   Cardiac Studies     Patient Profile     71 y.o. male   Assessment & Plan    1.  Severe mitral regurgitation: The patient has a flail mitral valve leaflet.  We were not able to see a vegetation on the mitral valve but we cannot completely exclude a small vegetation.  The patient has sepsis due to Staph aureus.  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  At present he is not a candidate for mitral valve repair or replacement.  Our goal is to hopefully have him improve to the point where he can have mitral valve surgery.  I discussed with the patient and his longtime family friend Victorino Dike that I am not sure that he will get to this point but that we would continue with antibiotic therapy and supportive therapy to hopefully improve his situation.         For questions or updates, please contact [facility] Please consult [email or web address] for contact info under        Signed, Kristeen Miss, MD  04/12/2023, 11:05 AM

## 2023-04-12 NOTE — Progress Notes (Signed)
Orthopedic Tech Progress Note Patient Details:  Peter [name] [birth year] [id number]  Ortho Devices Type of Ortho Device: Roland Rack boot Ortho Device/Splint Location: Bi-L LE Ortho Device/Splint Interventions: Application   Post Interventions Patient Tolerated: Well  [name] E Malyah Ohlrich 04/12/2023, 10:18 AM

## 2023-04-13 DIAGNOSIS — I340 Nonrheumatic mitral (valve) insufficiency: Secondary | ICD-10-CM | POA: Diagnosis not present

## 2023-04-13 DIAGNOSIS — N186 End stage renal disease: Secondary | ICD-10-CM | POA: Diagnosis not present

## 2023-04-13 DIAGNOSIS — R531 Weakness: Secondary | ICD-10-CM

## 2023-04-13 DIAGNOSIS — B9561 Methicillin susceptible Staphylococcus aureus infection as the cause of diseases classified elsewhere: Secondary | ICD-10-CM | POA: Diagnosis not present

## 2023-04-13 DIAGNOSIS — I38 Endocarditis, valve unspecified: Secondary | ICD-10-CM

## 2023-04-13 DIAGNOSIS — R7881 Bacteremia: Secondary | ICD-10-CM | POA: Diagnosis not present

## 2023-04-13 LAB — RENAL FUNCTION PANEL
Albumin: <1.5 g/dL — ABNORMAL LOW (ref 3.5–5.0)
Anion gap: 11 (ref 5–15)
BUN: 45 mg/dL — ABNORMAL HIGH (ref 8–23)
CO2: 23 mmol/L (ref 22–32)
Calcium: 7.8 mg/dL — ABNORMAL LOW (ref 8.9–10.3)
Chloride: 92 mmol/L — ABNORMAL LOW (ref 98–111)
Creatinine, Ser: 5.5 mg/dL — ABNORMAL HIGH (ref 0.61–1.24)
GFR, Estimated: 10 mL/min — ABNORMAL LOW (ref >60)
Glucose, Bld: 91 mg/dL (ref 70–99)
Phosphorus: 5.6 mg/dL — ABNORMAL HIGH (ref 2.5–4.6)
Potassium: 4.1 mmol/L (ref 3.5–5.1)
Sodium: 126 mmol/L — ABNORMAL LOW (ref 135–145)

## 2023-04-13 LAB — GLUCOSE, CAPILLARY
Glucose-Capillary: 104 mg/dL — ABNORMAL HIGH (ref 70–99)
Glucose-Capillary: 126 mg/dL — ABNORMAL HIGH (ref 70–99)
Glucose-Capillary: 133 mg/dL — ABNORMAL HIGH (ref 70–99)
Glucose-Capillary: 153 mg/dL — ABNORMAL HIGH (ref 70–99)
Glucose-Capillary: 155 mg/dL — ABNORMAL HIGH (ref 70–99)
Glucose-Capillary: 172 mg/dL — ABNORMAL HIGH (ref 70–99)
Glucose-Capillary: 98 mg/dL (ref 70–99)

## 2023-04-13 LAB — CBC
HCT: 22 % — ABNORMAL LOW (ref 39.0–52.0)
Hemoglobin: 7.8 g/dL — ABNORMAL LOW (ref 13.0–17.0)
MCH: 32.5 pg (ref 26.0–34.0)
MCHC: 35.5 g/dL (ref 30.0–36.0)
MCV: 91.7 fL (ref 80.0–100.0)
Platelets: 100 10*3/uL — ABNORMAL LOW (ref 150–400)
RBC: 2.4 MIL/uL — ABNORMAL LOW (ref 4.22–5.81)
RDW: 16.8 % — ABNORMAL HIGH (ref 11.5–15.5)
WBC: 14.7 10*3/uL — ABNORMAL HIGH (ref 4.0–10.5)
nRBC: 0 % (ref 0.0–0.2)

## 2023-04-13 LAB — HEPATIC FUNCTION PANEL
ALT: 8 U/L (ref 0–44)
AST: 37 U/L (ref 15–41)
Albumin: <1.5 g/dL — ABNORMAL LOW (ref 3.5–5.0)
Alkaline Phosphatase: 78 U/L (ref 38–126)
Bilirubin, Direct: 1.2 mg/dL — ABNORMAL HIGH (ref 0.0–0.2)
Indirect Bilirubin: 3.6 mg/dL — ABNORMAL HIGH (ref 0.3–0.9)
Total Bilirubin: 4.8 mg/dL — ABNORMAL HIGH (ref 0.3–1.2)
Total Protein: 5.9 g/dL — ABNORMAL LOW (ref 6.5–8.1)

## 2023-04-13 MED ORDER — CHLORHEXIDINE GLUCONATE CLOTH 2 % EX PADS
6.0000 | MEDICATED_PAD | Freq: Every day | CUTANEOUS | Status: DC
  Administered 2023-04-14 – 2023-04-19 (×6): 6 via TOPICAL

## 2023-04-13 MED ORDER — ALBUMIN HUMAN 25 % IV SOLN
25.0000 g | Freq: Four times a day (QID) | INTRAVENOUS | Status: AC
  Administered 2023-04-13 – 2023-04-14 (×4): 25 g via INTRAVENOUS
  Filled 2023-04-13 (×4): qty 100

## 2023-04-13 MED ORDER — CHLORHEXIDINE GLUCONATE CLOTH 2 % EX PADS: 6.0000 | MEDICATED_PAD | Freq: Every day | CUTANEOUS | Status: DC

## 2023-04-13 NOTE — Progress Notes (Addendum)
NAME:  Peter [name], MRN:  [id number], DOB:  June 26, 1951, LOS: 12 ADMISSION DATE:  04/01/2023, CONSULTATION DATE:  04/01/2023 REFERRING MD:  Dr. Thedore Mins - [facility], CHIEF COMPLAINT:   weakness/ fall/ NV   History of Present Illness:  39 yoM with PMH as below, significant for ESRD on MWF iHD via RUE AVF, afib on Eliquis, T2DM, anemia of CKD, HTN, HL, and OSA not on CPAP who presented with weakness from home. Started not to feel well on Sunday, 6/30 with nausea and vomiting. Sustained a fall, onto right knee and left shoulder pain. Admitted to [facility] on 7/2 for further workup with suspected sepsis, febrile 102.3 on admit. Blood cultures positive for MSSA bacteremia, source unclear at this time, being treated additionally for aspiration pneumonia, and mild transaminitis. Nephrology, ID, GI, and palliative care following. Ortho consulted and underwent aspiration of right knee showing many WBC, primarily PMNs. He underwent iHD 7/3 with 1.4L removed 7/3. TTE pending. Overnight, had intermittent low blood pressure episodes and patient is more sonolucent. Given concern for worsening sepsis, PCCM consulted.   Pertinent  Medical History  ESRD on MWF iHD via RUE AVF, afib on Eliquis, T2DM, anemia of CKD, HTN, HL, and OSA not on CPAP   Significant Hospital Events: Including procedures, antibiotic start and stop dates in addition to other pertinent events   7/2 admitted  7/3 iHD, right knee joint asp 7/8 to OR for I&D of right knee and bilateral shoulders.  7/9 iHD, eliquis restarted  7/10 poor PO intake, increased midodrine, iHD UF 1.5L, eliquis held for concern of bloody stools, WBC up/ afebrile 7/11 MRI brain neg, nafcillin changed to cefazolin, TEE  Interim History / Subjective:  On and off pressors overnight. More clear-headed today. Does not remember me from yesterday.  Objective   Blood pressure (!) 140/76, pulse 80, temperature 97.7 F (36.5 C), temperature source Oral, resp. rate 17, height 5\' 6"   (1.676 m), weight 105.8 kg, SpO2 94%. CVP:  [9 mmHg-14 mmHg] 14 mmHg      Intake/Output Summary (Last 24 hours) at 04/13/2023 1029 Last data filed at 04/13/2023 1025 Gross per 24 hour  Intake 1821.74 ml  Output 0 ml  Net 1821.74 ml   Filed Weights   04/10/23 0600 04/10/23 1114 04/11/23 0600  Weight: 104.3 kg 104.3 kg 105.8 kg   Examination:  No distress +murmur, ext warm Global mild anasarca Moves all ext to command Transmitted upper airway sounds c/w retained pharyngeal secretions RASS 0 Abd soft Stable jaundice  LFTs pending Probably some degree of DIC although schistocytes not seen on smear Nonspecific GB wall thickening w/o Murphy's sign on RUQ [place], no CBG dilation  Resolved Hospital Problem list   Toxic/ metabolic encephalopathy Aspiration PNA  Assessment & Plan:  Sepsis with MSSA bacteremia with 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 including bilateral shoulders, right knee and right wrist- s/p washout in OR 7/8 with ortho  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  ESRD on IHD MWF Physicial deconditioning, severe, acute on chronic Moderate relative protein calorie malnutrition Low grade DIC +/- hemolysis +/- ABLA-  keep eye on plts, usual transfusion thresholds, okay to continue [facility] for now Biliary stasis- unclear cause, watch for now ICU delirium- improved today, ammonia neg PAF, frequent ectopy- improved on amio, continues on eliquis  - Continue amio, NoAC - Continue cefazolin, duration TBD - Continue midodrine, levophed for MAP 65 - iHD per nephrology - Unna boots to mobilize extracellular fluid - Encourage PO, OOB -  Likely will need eventual surgical fix for mitral valve if we can get him a better operative candidate; too weak with multiorgan dysfunction currently - Albumin 25% 100g over 24h  Best Practice (right click and "Reselect all SmartList Selections" daily)   Diet/type: Regular consistency (see orders)> heart healthy  DVT  prophylaxis: systemic heparin> DOAC GI prophylaxis: PPI Lines: Central line Foley:  N/A Code Status:  full code Last date of multidisciplinary goals of care discussion: Full code with all aggressive interventions per patient   Updated patient/family bedside 04/13/23  31 min cc time Myrla Halsted MD PCCM

## 2023-04-13 NOTE — Progress Notes (Signed)
Rounding Note    Patient Name: Peter [name] Date of Encounter: 04/13/2023  [facility] Cardiologist: Rollene Rotunda, MD   Subjective   72 yo with hx of ESRD, poor dentition who was admitted with generalized weakness and illness for the past several months.  He ended up falling and was found on the floor. He has persistent atrial fibrillation, mitral valve regurgitation, hypertension, hyperlipidemia.  He has Staph aureus bacteremia.  He has been found to have an infected knee.  TEE reveals a flail mitral valve leaflet with moderate to severe mitral regurgitation.  He is extremely weak and at present is not a candidate for mitral valve repair or replacement.  Sharlotte Alamo, CMA at [facility] is a family friend who is in the room and helped with the discussion   Inpatient Medications    Scheduled Meds:  (feeding supplement) PROSource Plus  30 mL Oral TID BM   apixaban  5 mg Oral BID   atorvastatin  10 mg Oral QHS   Chlorhexidine Gluconate Cloth  6 each Topical Q0600   Chlorhexidine Gluconate Cloth  6 each Topical Q0600   Chlorhexidine Gluconate Cloth  6 each Topical Q0600   feeding supplement  237 mL Oral TID BM   ferric citrate  630 mg Oral TID WC   insulin aspart  0-9 Units Subcutaneous Q4H   midodrine  15 mg Oral TID WC   multivitamin  1 tablet Oral QHS   pantoprazole (PROTONIX) IV  40 mg Intravenous Q12H   Continuous Infusions:  sodium chloride Stopped (04/11/23 0946)   amiodarone 30 mg/hr (04/13/23 0800)   anticoagulant sodium citrate      ceFAZolin (ANCEF) IV Stopped (04/12/23 1407)   norepinephrine (LEVOPHED) Adult infusion Stopped (04/12/23 1145)   PRN Meds: Place/Maintain arterial line **AND** sodium chloride, acetaminophen, alteplase, anticoagulant sodium citrate, heparin, lidocaine (PF), lidocaine-prilocaine, mouth rinse, oxyCODONE, pentafluoroprop-tetrafluoroeth   Vital Signs    Vitals:   04/13/23 0600 04/13/23 0700 04/13/23 0800 04/13/23  0900  BP: (!) 122/46 (!) 116/57 128/62 (!) 108/44  Pulse: 79 81 87 80  Resp: 18 16 18 18   Temp:      TempSrc:      SpO2: 95% 95% 94% 94%  Weight:      Height:        Intake/Output Summary (Last 24 hours) at 04/13/2023 0943 Last data filed at 04/13/2023 0800 Gross per 24 hour  Intake 1467.53 ml  Output 0 ml  Net 1467.53 ml      04/11/2023    6:00 AM 04/10/2023   11:14 AM 04/10/2023    6:00 AM  Last 3 Weights  Weight (lbs) 233 lb 4 oz 229 lb 15 oz 229 lb 15 oz  Weight (kg) 105.8 kg 104.3 kg 104.3 kg      Telemetry    Atrial fibrillation with rapid ventricular response.- Personally Reviewed  ECG     - Personally Reviewed  Physical Exam   GEN: Elderly gentleman.  Appears older than stated age.  He is extremely weak.  Very poor dentition..   Neck: No JVD Cardiac: Irregularly irregular.  2/6 to 3/6 systolic ejection murmur. Respiratory: Clear to auscultation bilaterally. GI: Soft, nontender, non-distended  MS: No edema; No deformity. Neuro: Extremely weak, Psych: Normal affect   Labs    High Sensitivity Troponin:   Recent Labs  Lab 04/08/23 1915 04/08/23 2210 04/09/23 0152 04/09/23 0455  TROPONINIHS 127* 121* 137* 156*     Chemistry Recent Labs  Lab 04/08/23 0439 04/08/23 1212 04/09/23 0234 04/09/23 2323 04/10/23 0549 04/10/23 1556 04/11/23 0314 04/11/23 0420 04/12/23 0400 04/13/23 0431  NA  --    < > 125*   < > 130*  130*   < >  --  128* 128* 126*  K  --    < > 3.9   < > 3.4*  3.4*   < >  --  3.7 3.6 4.1  CL  --    < > 90*   < > 88*  90*   < >  --  94* 92* 92*  CO2  --    < > 21*   < > 22  22   < >  --  20* 24 23  GLUCOSE  --    < > 112*   < > 162*  163*   < >  --  106* 116* 91  BUN  --    < > 47*   < > 37*  37*   < >  --  48* 29* 45*  CREATININE  --    < > 5.07*   < > 4.54*  4.49*   < >  --  5.77* 4.29* 5.50*  CALCIUM  --    < > 6.1*   < > 6.8*  6.7*   < >  --  6.6* 7.2* 7.8*  MG  --    < > 2.3   < > 2.1  --  2.2  --  1.8  --   PROT 6.5   --  6.8  --   --   --  6.4*  --   --   --   ALBUMIN <1.5*  --  <1.5*  --  <1.5*  --  <1.5* <1.5* <1.5* <1.5*  AST 57*  --  55*  --   --   --  42*  --   --   --   ALT 19  --  19  --   --   --  15  --   --   --   ALKPHOS 67  --  106  --   --   --  81  --   --   --   BILITOT 12.9*  --  10.4*  --   --   --  9.1*  --   --   --   GFRNONAA  --    < > 11*   < > 13*  13*   < >  --  10* 14* 10*  ANIONGAP  --    < > 14   < > 20*  18*   < >  --  14 12 11    < > = values in this interval not displayed.    Lipids No results for input(s): "CHOL", "TRIG", "HDL", "LABVLDL", "LDLCALC", "CHOLHDL" in the last 168 hours.  Hematology Recent Labs  Lab 04/11/23 0314 04/11/23 0823 04/12/23 0400 04/12/23 1345 04/13/23 0431  WBC 12.3*  --  17.5*  --  14.7*  RBC 2.76*  --  2.62*  --  2.40*  HGB 9.2* 9.4* 8.7*  --  7.8*  HCT 25.7* 26.3* 23.8*  --  22.0*  MCV 93.1  --  90.8  --  91.7  MCH 33.3  --  33.2  --  32.5  MCHC 35.8  --  36.6*  --  35.5  RDW 16.2*  --  16.0*  --  16.8*  PLT 145*  --  122* 113* 100*   Thyroid  No results for input(s): "TSH", "FREET4" in the last 168 hours.   BNP No results for input(s): "BNP", "PROBNP" in the last 168 hours.   DDimer  Recent Labs  Lab 04/12/23 1345  DDIMER 9.28*     Radiology    US Abdomen Limited RUQ (LIVER/GB)  Result Date: 04/12/2023 CLINICAL DATA:  Transaminitis. EXAM: ULTRASOUND ABDOMEN LIMITED RIGHT UPPER QUADRANT COMPARISON:  CT of the abdomen pelvis with contrast 04/02/2023 FINDINGS: Gallbladder: Gallbladder wall thickening is present, measuring up to 7 mm. No stone is present. No sonographic Eulah Pont sign is reported. Common bile duct: Diameter: 3 mm, within normal limits. Liver: Homogeneous diffuse increased echogenicity is present throughout the liver. No discrete lesions are present. Portal vein is patent on color Doppler imaging with normal direction of blood flow towards the liver. Other: A right pleural effusion is noted. IMPRESSION: 1. Diffuse  increased echogenicity throughout the liver is nonspecific, but can be seen in the setting of hepatic steatosis. 2. Gallbladder wall thickening is nonspecific. This could represent inflammatory changes associated with acute cholecystitis. No stone or sonographic Eulah Pont sign is present to confirm the diagnosis. 3. Right pleural effusion. Electronically Signed   By: Marin Roberts M.D.   On: 04/12/2023 14:13    Cardiac Studies     Patient Profile     72 y.o. male   Assessment & Plan    1.  Severe mitral regurgitation: The patient has a flail mitral valve leaflet.  We were not able to see a vegetation on the mitral valve but we cannot completely exclude a small vegetation.  The patient has sepsis due to Staph aureus.  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   Will very weak Will need to make significant progress before we could consider him for valve surgery   Continue current plans per PCCM and nephrology      For questions or updates, please contact [facility] Please consult [email or web address] for contact info under        Signed, Kristeen Miss, MD  04/13/2023, 9:43 AM

## 2023-04-13 NOTE — Progress Notes (Signed)
Pharmacy Antibiotic Note  Peter [name] is a 72 y.o. male with ESRD on HD (HD sessions: MWF) admitted on 04/01/2023 with MSSA bacteremia/osteomyelitis. Pharmacy has been consulted for cefazolin dosing.  Today is day # 4 of cefazolin, day 11 total abx.  Plan: Continue cefazolin 1g IV Q24H to be given in the evenings after patient's HD sessions.  Once HD schedule set can eventually change cefazolin to 2g qHD. 6 total weeks of treatment planned.    Height: 5\' 6"  (167.6 cm) Weight: 105.8 kg (233 lb 4 oz) IBW/kg (Calculated) : 63.8  Temp (24hrs), Avg:98.3 F (36.8 C), Min:97.7 F (36.5 C), Max:98.7 F (37.1 C)  Recent Labs  Lab 04/09/23 0234 04/09/23 2323 04/10/23 0549 04/10/23 1556 04/11/23 0314 04/11/23 0420 04/12/23 0400 04/13/23 0431  WBC 27.0*  --  15.5*  --  12.3*  --  17.5* 14.7*  CREATININE 5.07*   < > 4.54*  4.49* 5.04*  --  5.77* 4.29* 5.50*   < > = values in this interval not displayed.    Estimated Creatinine Clearance: 13.4 mL/min (A) (by C-G formula based on SCr of 5.5 mg/dL (H)).    Allergies  Allergen Reactions   Doxazosin Mesylate     ineffective for BP (10/2014)   Triamterene-Hctz     increased creatinine (see 09/2014 office note)    Antimicrobials this admission: CFTX 7/2 >>7/2 Azith 7/2 >>7/2 Cefazolin 7/2 >> 7/3 , 7/11> Unasyn 7/3 >> 7/4 Nafcillin 7/4 >>7/11 Tobra 7/7 x1- Intra-articular   Microbiology results: 7/2 BCx: MSSA 7/3 MRSA PCR: negative 7/3 Wound Cx: rare Staph aureus 7/4 BCx: NGTD 7/6 Wound Cx: rare Staph aureus 7/8 Wound Cx: few Staph aureus   Thank you for allowing pharmacy to be a part of this patient's care.  Reece Leader, Colon Flattery, BCCP Clinical Pharmacist  04/13/2023 3:42 PM   [facility] pharmacy phone numbers are listed on [email or web address]

## 2023-04-13 NOTE — Progress Notes (Signed)
  [facility] Progress Note   Subjective: seen in ICU. Less confused today.  On low dose levo and vaso gtts.   Objective Vitals:   04/07/23 1415 04/07/23 1430 04/07/23 1445 04/07/23 1500  BP: (!) 105/55 (!) 107/50 114/71 (!) 92/51  Pulse: 77 84 93 72  Resp: 20 19 18 18   Temp:      TempSrc:      SpO2: 94% 95% (!) 88% 97%  Weight:      Height:       Physical Exam General: Ill appearing man, post op is drowsy Heart:Irregularly irregular Lungs: CTA anteriorly, poor effort Abdomen: soft Extremities: 1+ BLE edema; 1+ RUE edema Dialysis Access: R AVG + bruit   Home meds - eliquis, lipitor, auryxia 3 ac tid, prns   Dialysis Orders:  MWF [name] 4hr  450/800     97.5kg    3K/2Ca   AVG   15g   Heparin 10,000 bolus - no ESA, Hgb > 12 - No VDRA (recently stopped d/t Ca 10.9)   Assessment/ Plan Sepsis/ aspiration pneumonia / MSSA bacteremia: 1st set Blood Cx 7/2 positive w/ aspirate from knee also + for staph aureus.  AVG without signs of infection. SP washout/ I&D of bilat shoulder and R knee 7/08 per ortho.  ID following, was on nafcillin, now IV ancef. Hypotension - midodrine 10 tid + levo gtt at 1- 7 micrograms/min Hypokalemia - w/ NSVT. K+ 3.7 today , improving ESRD: HD is MWF. Next HD Monday. Will consider extra HD this evening if staff is available.  Volume: vol overload continues to worsen. +LE / UE edema, +8kg over dry wt. Hypotension has been limiting UF on HD. As above.  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 SP IV Ca. Phos high, cont binders.  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 Pt is not candidate for repair/ replacement at this time per cardiology.  Elevated LFTs - tbili peaked at 12 and coming down now A-Fib - on amio, NoAC  Rob Arlean Hopping  MD  [facility] 04/13/2023, 12:39 PM  Recent Labs  Lab 04/12/23 0400 04/13/23 0431  HGB 8.7* 7.8*  ALBUMIN <1.5* <1.5*  <1.5*  CALCIUM  7.2* 7.8*  PHOS 4.2 5.6*  CREATININE 4.29* 5.50*  K 3.6 4.1    Inpatient medications:  (feeding supplement) PROSource Plus  30 mL Oral TID BM   apixaban  5 mg Oral BID   atorvastatin  10 mg Oral QHS   Chlorhexidine Gluconate Cloth  6 each Topical Q0600   Chlorhexidine Gluconate Cloth  6 each Topical Q0600   Chlorhexidine Gluconate Cloth  6 each Topical Q0600   feeding supplement  237 mL Oral TID BM   ferric citrate  630 mg Oral TID WC   insulin aspart  0-9 Units Subcutaneous Q4H   midodrine  15 mg Oral TID WC   multivitamin  1 tablet Oral QHS   pantoprazole (PROTONIX) IV  40 mg Intravenous Q12H    sodium chloride Stopped (04/11/23 0946)   albumin human     amiodarone 30 mg/hr (04/13/23 1100)   anticoagulant sodium citrate      ceFAZolin (ANCEF) IV Stopped (04/12/23 1407)   norepinephrine (LEVOPHED) Adult infusion Stopped (04/12/23 1145)   Place/Maintain arterial line **AND** sodium chloride, acetaminophen, alteplase, anticoagulant sodium citrate, heparin, lidocaine (PF), lidocaine-prilocaine, mouth rinse, oxyCODONE, pentafluoroprop-tetrafluoroeth

## 2023-04-13 NOTE — Progress Notes (Signed)
Patient ID: Peter [name], male   DOB: August 20, 1951, 72 y.o.   MRN: [id number]    Progress Note from the Palliative Medicine Team at [facility]   Patient Name: Peter [name]        Date: 04/13/2023 DOB: 1951/09/12  Age: 72 y.o. MRN#: [id number] Attending Physician: Lorin Glass, MD Primary Care Physician: [name], MD Admit Date: 04/01/2023    Extensive chart review has been completed prior to meeting with patient/family  including labs, vital signs, imaging, progress/consult notes, orders, medications and available advance directive documents.   19 yoM with PMH as below, significant for ESRD on MWF iHD via RUE AVF, afib on Eliquis, T2DM, anemia of CKD, HTN, HL, and OSA not on CPAP who presented with weakness from home.   Patient's significant other/[name] reports slow continued physical and functional decline.  Patient has had falls.  Admitted for treatment and stabilization.  Blood cultures positive for MRSA bacteremia  Significant Hospital events 7/2 admitted  7/3 iHD, right knee joint asp 7/8 to OR for I&D of right knee and bilateral shoulders.  7/9 iHD, eliquis restarted  7/10 poor PO intake, increased midodrine, iHD UF 1.5L, eliquis held for concern of bloody stools, WBC up/ afebrile 7/11 MRI brain neg, nafcillin changed to cefazolin, TEE significant for flail mitral valve leaflet with moderate to severe mitral regurgitation, he is not a candidate for repair at this time 7/14 patient remains in the ICU    This NP assessed patient at the bedside as a follow up for palliative medicine needs and emotional support.  Significant Other/ Sarah at bedside   Patient is more awake today, engages in conversation and he is oriented x 3.  Continued education and emotional support regarding patient's multiple co-morbidities; end-stage renal disease/dialysis dependent, sepsis with MRSA bacteremia, protein calorie malnutrition and physical deconditioning.  All are  aware of the seriousness of his current medical situation.  Plan is to continue to treat the treatable, all are hopeful for improvement.     Education offered today regarding  the importance of continued conversation with family and their  medical providers regarding overall plan of care and treatment options,  ensuring decisions are within the context of the patients values and GOCs.  Patient is interested in completing H POA and advance care planning documents, will place referral for spiritual care  Questions and concerns addressed     Time: 35 minutes  Detailed review of medical records ( labs, imaging, vital signs), medically appropriate exam ( MS, skin, resp)   discussed with treatment team, counseling and education to patient, family, staff, documenting clinical information, medication management, coordination of care    Lorinda Creed NP  Palliative Medicine Team Team Phone # [phone] Pager [phone]

## 2023-04-14 DIAGNOSIS — R7881 Bacteremia: Secondary | ICD-10-CM | POA: Diagnosis not present

## 2023-04-14 DIAGNOSIS — D72829 Elevated white blood cell count, unspecified: Secondary | ICD-10-CM | POA: Diagnosis not present

## 2023-04-14 DIAGNOSIS — B9561 Methicillin susceptible Staphylococcus aureus infection as the cause of diseases classified elsewhere: Secondary | ICD-10-CM | POA: Diagnosis not present

## 2023-04-14 DIAGNOSIS — I10 Essential (primary) hypertension: Secondary | ICD-10-CM

## 2023-04-14 LAB — HEPATIC FUNCTION PANEL
ALT: 7 U/L (ref 0–44)
AST: 32 U/L (ref 15–41)
Albumin: 1.7 g/dL — ABNORMAL LOW (ref 3.5–5.0)
Alkaline Phosphatase: 77 U/L (ref 38–126)
Bilirubin, Direct: 1 mg/dL — ABNORMAL HIGH (ref 0.0–0.2)
Indirect Bilirubin: 2.5 mg/dL — ABNORMAL HIGH (ref 0.3–0.9)
Total Bilirubin: 3.5 mg/dL — ABNORMAL HIGH (ref 0.3–1.2)
Total Protein: 6.6 g/dL (ref 6.5–8.1)

## 2023-04-14 LAB — IRON AND TIBC: Iron: 28 ug/dL — ABNORMAL LOW (ref 45–182)

## 2023-04-14 LAB — GLUCOSE, CAPILLARY
Glucose-Capillary: 127 mg/dL — ABNORMAL HIGH (ref 70–99)
Glucose-Capillary: 133 mg/dL — ABNORMAL HIGH (ref 70–99)
Glucose-Capillary: 133 mg/dL — ABNORMAL HIGH (ref 70–99)
Glucose-Capillary: 148 mg/dL — ABNORMAL HIGH (ref 70–99)
Glucose-Capillary: 136 mg/dL — ABNORMAL HIGH (ref 70–99)
Glucose-Capillary: 140 mg/dL — ABNORMAL HIGH (ref 70–99)

## 2023-04-14 LAB — CBC
HCT: 20.9 % — ABNORMAL LOW (ref 39.0–52.0)
Hemoglobin: 7.5 g/dL — ABNORMAL LOW (ref 13.0–17.0)
MCH: 33.8 pg (ref 26.0–34.0)
MCHC: 35.9 g/dL (ref 30.0–36.0)
MCV: 94.1 fL (ref 80.0–100.0)
Platelets: 114 10*3/uL — ABNORMAL LOW (ref 150–400)
RBC: 2.22 MIL/uL — ABNORMAL LOW (ref 4.22–5.81)
RDW: 16.4 % — ABNORMAL HIGH (ref 11.5–15.5)
WBC: 11.6 10*3/uL — ABNORMAL HIGH (ref 4.0–10.5)
nRBC: 0 % (ref 0.0–0.2)

## 2023-04-14 LAB — FERRITIN: Ferritin: 3618 ng/mL — ABNORMAL HIGH (ref 24–336)

## 2023-04-14 MED ORDER — AMIODARONE HCL 200 MG PO TABS
200.0000 mg | ORAL_TABLET | Freq: Two times a day (BID) | ORAL | Status: DC
  Administered 2023-04-14 – 2023-04-18 (×9): 200 mg via ORAL
  Filled 2023-04-14 (×10): qty 1

## 2023-04-14 MED ORDER — DARBEPOETIN ALFA 100 MCG/0.5ML IJ SOSY
100.0000 ug | PREFILLED_SYRINGE | INTRAMUSCULAR | Status: DC
  Administered 2023-04-14: 100 ug via SUBCUTANEOUS
  Filled 2023-04-14: qty 0.5

## 2023-04-14 MED ORDER — CEFAZOLIN SODIUM-DEXTROSE 2-4 GM/100ML-% IV SOLN
2.0000 g | INTRAVENOUS | Status: DC
  Administered 2023-04-14 – 2023-04-16 (×2): 2 g via INTRAVENOUS
  Filled 2023-04-14 (×2): qty 100

## 2023-04-14 MED ORDER — MIDODRINE HCL 5 MG PO TABS
10.0000 mg | ORAL_TABLET | Freq: Three times a day (TID) | ORAL | Status: DC
  Administered 2023-04-14: 10 mg via ORAL
  Filled 2023-04-14: qty 2

## 2023-04-14 NOTE — Progress Notes (Signed)
patient in ICU  Alert and oriented.  Informed consent signed and in chart.   TX duration: 3.5 HOURS  Patient tolerated well.  Alert, without acute distress.  Hand-off given to patient's nurse.   Access used: FISTULA Access issues: NONE  Total UF removed: 3000 ML Medication(s) given: NONE Post HD VS: 130/53 78 19  Post HD weight: UNABLE TO OBTAIN    04/14/23 0115  Vitals  Temp 98.4 F (36.9 C)  Temp Source Oral  BP (!) 130/53  MAP (mmHg) 72  BP Location Left Leg  BP Method Automatic  Patient Position (if appropriate) Lying  Pulse Rate 82  Pulse Rate Source Monitor  ECG Heart Rate 82  Resp (!) 21  Oxygen Therapy  SpO2 94 %  O2 Device Nasal Cannula  O2 Flow Rate (L/min) 2 L/min  Patient Activity (if Appropriate) In bed  Pulse Oximetry Type Continuous  Post Treatment  Dialyzer Clearance Lightly streaked  Duration of HD Treatment -hour(s) 3.5 hour(s)  Hemodialysis Intake (mL) 0 mL  Liters Processed 84  Fluid Removed (mL) 3000 mL  Tolerated HD Treatment Yes  Post-Hemodialysis Comments HD tx completed as exected, tolerated well.  AVG/AVF Arterial Site Held (minutes) 10 minutes  AVG/AVF Venous Site Held (minutes) 10 minutes  Fistula / Graft Right Upper arm Arteriovenous vein graft  Placement Date/Time: 01/16/23 1122   Placed prior to admission: No  Orientation: Right  Access Location: Upper arm  Access Type: Arteriovenous vein graft  Expiration Date: 07/15/27  Site Condition No complications  Fistula / Graft Assessment Present;Thrill;Bruit  Status Accessed;Patent  Drainage Description None

## 2023-04-14 NOTE — Consult Note (Signed)
WOC Nurse Consult Note: Reason for Consult: Consult requested for sacrum. Pt is critically ill with multiple systemic factors which can impair healing.  He is on a low airloss mattress to reduce pressure.  Wound type: Pt was noted to have a Deep tissue pressure injury to the sacrum upon admission, according to the bedside nurse's wound care flowsheet. This has evolved into a Stage 3 pressure injury; 100% red and moist to sacrum/gluteal fold/inner buttock; 9X1.5X.2cm, mod amt tan drainage. Pressure Injury POA: Yes Dressing procedure/placement/frequency: Topical treatment orders provided for bedside nurses to perform as follows: Cut piece of Aquacel [place] # [phone]) and apply to sacrum/buttock wound Q day, then cover with foam dressing.  Change foam dressing Q 3 days or PRN soiling.  Please re-consult if further assistance is needed.  Thank-you,  Cammie Mcgee MSN, RN, CWOCN, [place], CNS [phone]

## 2023-04-14 NOTE — Progress Notes (Signed)
Rounding Note    Patient Name: Peter [name] Date of Encounter: 04/14/2023  [facility] Cardiologist: Rollene Rotunda, MD   Subjective   Pt resting   Opens eyes     Inpatient Medications    Scheduled Meds:  (feeding supplement) PROSource Plus  30 mL Oral TID BM   amiodarone  200 mg Oral BID   apixaban  5 mg Oral BID   atorvastatin  10 mg Oral QHS   Chlorhexidine Gluconate Cloth  6 each Topical Q0600   Chlorhexidine Gluconate Cloth  6 each Topical Q0600   Chlorhexidine Gluconate Cloth  6 each Topical Q0600   Chlorhexidine Gluconate Cloth  6 each Topical Q0600   Chlorhexidine Gluconate Cloth  6 each Topical Q0600   darbepoetin (ARANESP) injection - DIALYSIS  100 mcg Subcutaneous Q Mon-1800   feeding supplement  237 mL Oral TID BM   ferric citrate  630 mg Oral TID WC   insulin aspart  0-9 Units Subcutaneous Q4H   midodrine  10 mg Oral TID WC   multivitamin  1 tablet Oral QHS   pantoprazole (PROTONIX) IV  40 mg Intravenous Q12H   Continuous Infusions:  sodium chloride Stopped (04/11/23 0946)   anticoagulant sodium citrate      ceFAZolin (ANCEF) IV Stopped (04/13/23 1401)   norepinephrine (LEVOPHED) Adult infusion Stopped (04/12/23 1145)   PRN Meds: Place/Maintain arterial line **AND** sodium chloride, acetaminophen, alteplase, anticoagulant sodium citrate, heparin, lidocaine (PF), lidocaine-prilocaine, mouth rinse, oxyCODONE, pentafluoroprop-tetrafluoroeth   Vital Signs    Vitals:   04/14/23 0400 04/14/23 0500 04/14/23 0600 04/14/23 0832  BP: (!) 124/48 (!) 174/63 (!) 170/57   Pulse: 77 80 78   Resp: 18 19 17    Temp: 98.2 F (36.8 C)   98.4 F (36.9 C)  TempSrc: Oral   Oral  SpO2: 93% 95% 95%   Weight:  108.6 kg    Height:        Intake/Output Summary (Last 24 hours) at 04/14/2023 1231 Last data filed at 04/14/2023 0400 Gross per 24 hour  Intake 933.56 ml  Output 3025 ml  Net -2091.44 ml      04/14/2023    5:00 AM 04/11/2023    6:00 AM  04/10/2023   11:14 AM  Last 3 Weights  Weight (lbs) 239 lb 6.7 oz 233 lb 4 oz 229 lb 15 oz  Weight (kg) 108.6 kg 105.8 kg 104.3 kg      Telemetry    Atrial fibrillation 80s - Personally Reviewed  ECG    No new  - Personally Reviewed  Physical Exam   GEN  Pt resting   Opens eyes  Neck: No JVD Cardiac: Irregularly irregular.   3/6 systolic ejection murmur. Respiratory: Clear to auscultation bilaterally anteriorly   GI: Soft, nontender, non-distended  MS: 1-2+ UE and LE edema   (legs wrapped)  Labs    High Sensitivity Troponin:   Recent Labs  Lab 04/08/23 1915 04/08/23 2210 04/09/23 0152 04/09/23 0455  TROPONINIHS 127* 121* 137* 156*     Chemistry Recent Labs  Lab 04/10/23 0549 04/10/23 1556 04/11/23 0314 04/11/23 0420 04/12/23 0400 04/13/23 0431 04/14/23 0357  NA 130*  130*   < >  --  128* 128* 126*  --   K 3.4*  3.4*   < >  --  3.7 3.6 4.1  --   CL 88*  90*   < >  --  94* 92* 92*  --   CO2 22  22   < >  --  20* 24 23  --   GLUCOSE 162*  163*   < >  --  106* 116* 91  --   BUN 37*  37*   < >  --  48* 29* 45*  --   CREATININE 4.54*  4.49*   < >  --  5.77* 4.29* 5.50*  --   CALCIUM 6.8*  6.7*   < >  --  6.6* 7.2* 7.8*  --   MG 2.1  --  2.2  --  1.8  --   --   PROT  --   --  6.4*  --   --  5.9* 6.6  ALBUMIN <1.5*  --  <1.5* <1.5* <1.5* <1.5*  <1.5* 1.7*  AST  --   --  42*  --   --  37 32  ALT  --   --  15  --   --  8 7  ALKPHOS  --   --  81  --   --  78 77  BILITOT  --   --  9.1*  --   --  4.8* 3.5*  GFRNONAA 13*  13*   < >  --  10* 14* 10*  --   ANIONGAP 20*  18*   < >  --  14 12 11   --    < > = values in this interval not displayed.    Lipids No results for input(s): "CHOL", "TRIG", "HDL", "LABVLDL", "LDLCALC", "CHOLHDL" in the last 168 hours.  Hematology Recent Labs  Lab 04/11/23 0314 04/11/23 0823 04/12/23 0400 04/12/23 1345 04/13/23 0431  WBC 12.3*  --  17.5*  --  14.7*  RBC 2.76*  --  2.62*  --  2.40*  HGB 9.2* 9.4* 8.7*  --  7.8*   HCT 25.7* 26.3* 23.8*  --  22.0*  MCV 93.1  --  90.8  --  91.7  MCH 33.3  --  33.2  --  32.5  MCHC 35.8  --  36.6*  --  35.5  RDW 16.2*  --  16.0*  --  16.8*  PLT 145*  --  122* 113* 100*   Thyroid  No results for input(s): "TSH", "FREET4" in the last 168 hours.   BNP No results for input(s): "BNP", "PROBNP" in the last 168 hours.   DDimer  Recent Labs  Lab 04/12/23 1345  DDIMER 9.28*      Cardiac Studies   TEE  04/10/23   1. Left ventricular ejection fraction, by estimation, is 60 to 65%. The  left ventricle has normal function. The left ventricle has no regional  wall motion abnormalities.   2. Right ventricular systolic function is normal. The right ventricular  size is normal.   3. Abnormal Thickened MV leaflets with moderate mitral annular  calcification. There appears to be a flail P2 scallop and very eccentric  MR directed anteriorly. The MR is at least moderate to severe but no flow  into the left upper PV. The other pulmonary   veins could not be identified. The MR jet wraps around the left atrium..  The mitral valve is abnormal. Moderate to severe mitral valve  regurgitation. No evidence of mitral stenosis. Moderate mitral annular  calcification.   4. The aortic valve is tricuspid. There is mild calcification of the  aortic valve. There is mild thickening of the aortic valve. There is  moderate aortic valve annular calcification.  Aortic valve regurgitation is not visualized. Mild aortic valve  stenosis.   5. The inferior vena cava is normal in size with greater than 50%  respiratory variability, suggesting right atrial pressure of 3 mmHg.   6. No left atrial/left atrial appendage thrombus was detected.   7. Aortic dilatation noted. There is mild dilatation of the aortic root,  measuring 38 mm. There is mild (Grade II) layered plaque.   Conclusion(s)/Recommendation(s): Normal biventricular function with  moderate to severe mitral regurgitation due to  flail P2 segment of  posterior MV leaflet. The leaflet is thickened and cannot discern whether  this represents myxomatous degeneration or  vegetation.   TTE  04/03/23   1. Left ventricular ejection fraction, by estimation, is 60 to 65%. The  left ventricle has normal function. The left ventricle has no regional  wall motion abnormalities. There is severe left ventricular hypertrophy.  Left ventricular diastolic parameters   are indeterminate.   2. Right ventricular systolic function is normal. The right ventricular  size is normal.   3. Left atrial size was moderately dilated.   4. Mild appearing MR but some splay artifact cannot r/o some posterior  leaflet prolapse in addition to MAC with anteriorly directed MR Given  bacteremia and abnormal posterior mitral annulus suggest TEE to further  assess if clnically indicated. The  mitral valve is degenerative. Mild mitral valve regurgitation. No evidence  of mitral stenosis. Moderate mitral annular calcification.   5. The aortic valve is tricuspid. There is moderate calcification of the  aortic valve. There is moderate thickening of the aortic valve. Aortic  valve regurgitation is not visualized. Mild aortic valve stenosis.   6. The inferior vena cava is dilated in size with >50% respiratory  variability, suggesting right atrial pressure of 8 mmHg.    Assessment & Plan    1.  Severe mitral regurgitation due to flail segment of posterior mitral leaflet   Currently not a candidate for intervention    Volume managed by dialysis  2  HFpEF   Volume remains overloaded   Renal following  Managed by dialysis.  3  Atrial fib  Remains rate controlled in Afib on amiodarone po and Eliquis  4  ID Pt being treated for MSSA bacteremia, sepsis    4  Hypotension  Pt currently  on midodrine 10 mg tid   Off of levophed since 7/13    He is now hypertensive   Stop midodrine   Follow BP   For questions or updates, please contact [facility] Please consult [email or web address] for contact info under        Signed, Dietrich Pates, MD  04/14/2023, 12:31 PM

## 2023-04-14 NOTE — Progress Notes (Signed)
Physical Therapy Treatment Patient Details Name: Peter [name] MRN: [id number] DOB: Jul 05, 1951 Today's Date: 04/14/2023   History of Present Illness Pt is a 72 y.o. male admitted 04/01/23 after presenting to ED with generalized weakness, nausea, vomiting and intermittent fevers and chills since Sunday morning. Pt subsequently found have aspiration pneumonia and found to be septic with blood cultures positive for Staph aureus, BCID with MSSA. Pt further reported falling on Sunday evening, unable to get up and on the floor for a few hours, bruising and swelling noted around Left eye/face. PMH significant of ESRD on HD MWF, Type 2 DM, anemia of CKD, hypertension, paroxysmal atrial fibrillation, and hyperlipidemia.    PT Comments  Patient progressing up OOB x 2 today via nursing then with PT/nursing assist with lift.  Patient with pain during rolling for placing lift pad, but per RN was more comfortable using maxisky lift than maximove.  Patient continues with edema and pain in multiple joints though tolerating working extremities knowing he needs to despite pain.  Family at the bedside and supportive.  PT will continue to follow.  Recommend post-acute inpatient rehab <3 hours/day prior to d/c home.      Assistance Recommended at Discharge Frequent or constant Supervision/Assistance  If plan is discharge home, recommend the following:  Can travel by private vehicle    Two people to help with walking and/or transfers;A lot of help with bathing/dressing/bathroom;Assistance with cooking/housework;Assist for transportation;Help with stairs or ramp for entrance;Direct supervision/assist for medications management   No  Equipment Recommendations  None recommended by PT    Recommendations for Other Services       Precautions / Restrictions Precautions Precautions: Fall Precaution Comments: watch O2 and BP; R knee and R wrist pain and L shoulder.     Mobility  Bed Mobility Overal bed mobility:  Needs Assistance Bed Mobility: Rolling Rolling: Max assist, +2 for physical assistance         General bed mobility comments: rolling with A of RN to place lift pad    Transfers Overall transfer level: Needs assistance   Transfers: Bed to chair/wheelchair/BSC             General transfer comment: RN assisted with lift to recliner via Dispensing optician via Lift Equipment: Maxisky  Ambulation/Gait                   Stairs             Wheelchair Mobility     Tilt Bed    Modified Rankin (Stroke Patients Only)       Balance Overall balance assessment: Needs assistance Sitting-balance support: Feet supported Sitting balance-Leahy Scale: Poor Sitting balance - Comments: assist for leaning forward off back of chair to place pillow at his back                                    Cognition Arousal/Alertness: Awake/alert Behavior During Therapy: Anxious Overall Cognitive Status: Impaired/Different from baseline Area of Impairment: Problem solving, Attention, Following commands                   Current Attention Level: Sustained   Following Commands: Follows one step commands consistently, Follows one step commands with increased time     Problem Solving: Slow processing, Decreased initiation          Exercises General Exercises - Upper Extremity Shoulder Flexion: AAROM, Both,  Seated, 5 reps Elbow Flexion: AROM, 5 reps, Seated, Both Digit Composite Flexion: AROM, AAROM, 5 reps, Left, Supine General Exercises - Lower Extremity Ankle Circles/Pumps: AROM, Both, Supine, 5 reps Long Arc Quad: AROM, Both, Seated, 5 reps Heel Slides: AAROM, 5 reps, Left, Supine    General Comments General comments (skin integrity, edema, etc.): VSS with transfer OOB on RA but reapplied O2 @1  LPM up in chair; wife present and supportive; obtained new yellow foam for pt to squeeze as RN reports previous ones missing from room      Pertinent  Vitals/Pain Pain Assessment Pain Assessment: Faces Faces Pain Scale: Hurts even more Pain Location: generalized, most at R wrist, R knee Pain Descriptors / Indicators: Aching, Discomfort, Grimacing, Guarding Pain Intervention(s): Monitored during session, Repositioned, RN gave pain meds during session    Home Living                          Prior Function            PT Goals (current goals can now be found in the care plan section) Progress towards PT goals: Progressing toward goals    Frequency    Min 3X/week      PT Plan Current plan remains appropriate    Co-evaluation              AM-PAC PT "6 Clicks" Mobility   Outcome Measure  Help needed turning from your back to your side while in a flat bed without using bedrails?: Total Help needed moving from lying on your back to sitting on the side of a flat bed without using bedrails?: Total Help needed moving to and from a bed to a chair (including a wheelchair)?: Total Help needed standing up from a chair using your arms (e.g., wheelchair or bedside chair)?: Total Help needed to walk in hospital room?: Total Help needed climbing 3-5 steps with a railing? : Total 6 Click Score: 6    End of Session   Activity Tolerance: Patient limited by pain Patient left: in chair;with call bell/phone within reach;with family/visitor present;with nursing/sitter in room   PT Visit Diagnosis: Muscle weakness (generalized) (M62.81);History of falling (Z91.81);Other abnormalities of gait and mobility (R26.89);Pain Pain - Right/Left: Right Pain - part of body: Knee     Time: 0981-1914 PT Time Calculation (min) (ACUTE ONLY): 28 min  Charges:    $Therapeutic Exercise: 8-22 mins $Therapeutic Activity: 8-22 mins PT General Charges $$ ACUTE PT VISIT: 1 Visit                     Sheran Lawless, PT Acute Rehabilitation Services Office:[phone] 04/14/2023    [name] 04/14/2023, 5:11 PM

## 2023-04-14 NOTE — Progress Notes (Signed)
[facility] Progress Note    Assessment/ Plan:   Sepsis/ aspiration pneumonia / MSSA bacteremia: 1st set Blood Cx 7/2 positive w/ aspirate from knee also + for staph aureus.  AVG without signs of infection. SP washout/ I&D of bilat shoulder and R knee 7/08 per ortho.  ID following, was on nafcillin, now IV ancef. Hypotension - midodrine 10 tid. Off pressors Hypokalemia - w/ NSVT. Latest K+ 4.1  ESRD: HD is MWF. Next HD today, s/p extra treatment over night Volume: vol overload. +8kg over dry wt. Hypotension had been limiting UF on HD. As above.  Anemia of CKD: fe panel ordered, start ESA 7/15. 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 SP IV Ca. Phos high, cont binders.  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 Pt is not candidate for repair/ replacement at this time per cardiology.  Elevated LFTs - tbili peaked at 12 and coming down now A-Fib - on amio, eliquis  Outpatient Dialysis Orders:  MWF [name] 4hr  450/800     97.5kg    3K/2Ca   AVG   15g   Heparin 10,000 bolus - no ESA, Hgb > 12 - No VDRA (recently stopped d/t Ca 10.9)  Subjective:   Patient seen and examined bedside.  Extra HD overnight net UF 3 L.  No acute events. Off pressors   Objective:   BP (!) 170/57   Pulse 78   Temp 98.2 F (36.8 C) (Oral)   Resp 17   Ht 5\' 6"  (1.676 m)   Wt 108.6 kg   SpO2 95%   BMI 38.64 kg/m   Intake/Output Summary (Last 24 hours) at 04/14/2023 0804 Last data filed at 04/14/2023 0400 Gross per 24 hour  Intake 1320.63 ml  Output 3025 ml  Net -1704.37 ml   Weight change:   Physical Exam: Gen: Ill appearing, NAD CVS: Irregularly irregular Resp: CTA bl Abd: Soft Ext: 1+ edema bilateral lower extremities Neuro: Drowsy but awake Dialysis access: R AVG +b/t  Imaging: US Abdomen Limited RUQ (LIVER/GB)  Result Date: 04/12/2023 CLINICAL DATA:  Transaminitis. EXAM: ULTRASOUND ABDOMEN LIMITED RIGHT  UPPER QUADRANT COMPARISON:  CT of the abdomen pelvis with contrast 04/02/2023 FINDINGS: Gallbladder: Gallbladder wall thickening is present, measuring up to 7 mm. No stone is present. No sonographic Eulah Pont sign is reported. Common bile duct: Diameter: 3 mm, within normal limits. Liver: Homogeneous diffuse increased echogenicity is present throughout the liver. No discrete lesions are present. Portal vein is patent on color Doppler imaging with normal direction of blood flow towards the liver. Other: A right pleural effusion is noted. IMPRESSION: 1. Diffuse increased echogenicity throughout the liver is nonspecific, but can be seen in the setting of hepatic steatosis. 2. Gallbladder wall thickening is nonspecific. This could represent inflammatory changes associated with acute cholecystitis. No stone or sonographic Eulah Pont sign is present to confirm the diagnosis. 3. Right pleural effusion. Electronically Signed   By: Marin Roberts M.D.   On: 04/12/2023 14:13    Labs: BMET Recent Labs  Lab 04/07/23 1453 04/08/23 1212 04/09/23 0234 04/09/23 2323 04/10/23 0549 04/10/23 1556 04/11/23 0420 04/12/23 0400 04/13/23 0431  NA 130*   < > 125* 130* 130*  130* 130* 128* 128* 126*  K 5.5*   < > 3.9 3.5 3.4*  3.4* 3.7 3.7 3.6 4.1  CL 90*   < > 90* 90* 88*  90* 92* 94* 92* 92*  CO2 20*   < >  21* 21* 22  22 21* 20* 24 23  GLUCOSE 79   < > 112* 178* 162*  163* 164* 106* 116* 91  BUN 98*   < > 47* 34* 37*  37* 42* 48* 29* 45*  CREATININE 8.13*   < > 5.07* 4.12* 4.54*  4.49* 5.04* 5.77* 4.29* 5.50*  CALCIUM 6.3*   < > 6.1* 6.9* 6.8*  6.7* 6.8* 6.6* 7.2* 7.8*  PHOS 11.3*  --   --  7.0* 7.0*  --  6.2* 4.2 5.6*   < > = values in this interval not displayed.   CBC Recent Labs  Lab 04/08/23 0439 04/08/23 1701 04/09/23 0234 04/09/23 1319 04/10/23 0549 04/10/23 1556 04/11/23 0314 04/11/23 0823 04/12/23 0400 04/12/23 1345 04/13/23 0431  WBC 19.0*  --  27.0*  --  15.5*  --  12.3*  --  17.5*   --  14.7*  NEUTROABS 16.2*  --  25.4*  --   --   --   --   --   --   --   --   HGB 9.9*   < > 10.6*   < > 9.4*   < > 9.2* 9.4* 8.7*  --  7.8*  HCT 28.7*   < > 30.1*   < > 27.0*   < > 25.7* 26.3* 23.8*  --  22.0*  MCV 96.6  --  92.6  --  92.5  --  93.1  --  90.8  --  91.7  PLT 209  --  211  --  172  --  145*  --  122* 113* 100*   < > = values in this interval not displayed.    Medications:     (feeding supplement) PROSource Plus  30 mL Oral TID BM   apixaban  5 mg Oral BID   atorvastatin  10 mg Oral QHS   Chlorhexidine Gluconate Cloth  6 each Topical Q0600   Chlorhexidine Gluconate Cloth  6 each Topical Q0600   Chlorhexidine Gluconate Cloth  6 each Topical Q0600   Chlorhexidine Gluconate Cloth  6 each Topical Q0600   Chlorhexidine Gluconate Cloth  6 each Topical Q0600   feeding supplement  237 mL Oral TID BM   ferric citrate  630 mg Oral TID WC   insulin aspart  0-9 Units Subcutaneous Q4H   midodrine  15 mg Oral TID WC   multivitamin  1 tablet Oral QHS   pantoprazole (PROTONIX) IV  40 mg Intravenous Q12H      Anthony Sar, MD [facility] 04/14/2023, 8:04 AM

## 2023-04-14 NOTE — Progress Notes (Signed)
This chaplain responded to PMT [facility] consult for creating/updating the Pt. Advance Directive:  HCPOA and Living Will.  The Pt. is awake after receiving Pt. care. The Pt. S/O-Sarah is at the bedside.   The chaplain began rapport building with the Pt through reflective listening. The chaplain understands the Pt. goal is to return home. Home is in [place] where he has fond memories of hunting and fishing with family. The Pt. has family support from his brother.   The Pt. participated in AD education and answered the chaplain's clarifying questions before paging the notary.  This chaplain is present with the Pt., Chaplain-Tracy, Pt. S/O-Sarah, notary, and witnesses for the notarizing of the Pt. Advance Directive:  HCPOA and Living Will. The chaplain understands this document supercedes any other AD.  The Pt named Wells Guiles as his HCPOA. If this person is unable or unwilling to serve in this role the Pt. next choice is Max Biagio Quint.  The chaplain gave the original AD, along with two copies to the Pt. The chaplain scanned the Pt. AD into EMR.   This chaplain is available for F/U spiritual care as needed.  Chaplain Stephanie Acre [phone]

## 2023-04-14 NOTE — Progress Notes (Signed)
NAME:  Peter [name], MRN:  [id number], DOB:  [birth year], LOS: 13 ADMISSION DATE:  04/01/2023, CONSULTATION DATE:  04/01/2023 REFERRING MD:  Dr. Thedore Mins - TR[facility], CHIEF COMPLAINT:   weakness/ fall/ NV   History of Present Illness:  32 yoM with PMH as below, significant for ESRD on MWF iHD via RUE AVF, afib on Eliquis, T2DM, anemia of CKD, HTN, HL, and OSA not on CPAP who presented with weakness from home. Started not to feel well on Sunday, 6/30 with nausea and vomiting. Sustained a fall, onto right knee and left shoulder pain. Admitted to [facility] on 7/2 for further workup with suspected sepsis, febrile 102.3 on admit. Blood cultures positive for MSSA bacteremia, source unclear at this time, being treated additionally for aspiration pneumonia, and mild transaminitis. Nephrology, ID, GI, and palliative care following. Ortho consulted and underwent aspiration of right knee showing many WBC, primarily PMNs. He underwent iHD 7/3 with 1.4L removed 7/3. TTE pending. Overnight, had intermittent low blood pressure episodes and patient is more sonolucent. Given concern for worsening sepsis, PCCM consulted.   Pertinent  Medical History  ESRD on MWF iHD via RUE AVF, afib on Eliquis, T2DM, anemia of CKD, HTN, HL, and OSA not on CPAP   Significant Hospital Events: Including procedures, antibiotic start and stop dates in addition to other pertinent events   7/2 admitted  7/3 iHD, right knee joint asp 7/8 to OR for I&D of right knee and bilateral shoulders.  7/9 iHD, eliquis restarted  7/10 poor PO intake, increased midodrine, iHD UF 1.5L, eliquis held for concern of bloody stools, WBC up/ afebrile 7/11 MRI brain neg, nafcillin changed to cefazolin, TEE  Interim History / Subjective:  Remains off pressors x 24h Remains weak  Objective   Blood pressure (!) 170/57, pulse 78, temperature 98.4 F (36.9 C), temperature source Oral, resp. rate 17, height 5\' 6"  (1.676 m), weight 108.6 kg, SpO2 95%. CVP:  [10 mmHg-13  mmHg] 13 mmHg      Intake/Output Summary (Last 24 hours) at 04/14/2023 1021 Last data filed at 04/14/2023 0400 Gross per 24 hour  Intake 1187.25 ml  Output 3025 ml  Net -1837.75 ml   Filed Weights   04/10/23 1114 04/11/23 0600 04/14/23 0500  Weight: 104.3 kg 105.8 kg 108.6 kg   Examination:  No distress Anasarca improved with HD and unna boots Moves to command RASS 0   LFTs pending Probably some degree of DIC although schistocytes not seen on smear Nonspecific GB wall thickening w/o Murphy's sign on RUQ [place], no CBG dilation  Resolved Hospital Problem list   Toxic/ metabolic encephalopathy Aspiration PNA  Assessment & Plan:  Sepsis with MSSA bacteremia with 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 including bilateral shoulders, right knee and right wrist- s/p washout in OR 7/8 with ortho  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  ESRD on IHD MWF Physicial deconditioning, severe, acute on chronic Moderate relative protein calorie malnutrition Low grade DIC +/- hemolysis +/- ABLA-  keep eye on plts, usual transfusion thresholds, okay to continue [facility] for now; f/u CBC today Biliary stasis- unclear cause, improving ICU delirium- improving, ammonia neg PAF, frequent ectopy- improved on amio, continues on eliquis  - Continue amio, NoAC - Continue cefazolin, duration TBD - Lower midodrine slightly - iHD per nephrology - Unna boots to mobilize extracellular fluid - Encourage PO, OOB - Likely will need eventual surgical fix for mitral valve if we can get him a better operative candidate; too weak with  multiorgan dysfunction currently; may benefit from CIR - Stable for transfer to progressive, DC a line but keep central line due to need for long term abx  Best Practice (right click and "Reselect all SmartList Selections" daily)   Diet/type: Regular consistency (see orders)> heart healthy  DVT prophylaxis: systemic heparin> DOAC GI prophylaxis: PPI Lines:  Central line Foley:  N/A Code Status:  full code Last date of multidisciplinary goals of care discussion: Full code with all aggressive interventions per patient ; PMT following  Myrla Halsted MD PCCM

## 2023-04-14 NOTE — Progress Notes (Signed)
Pharmacy Antibiotic Note  Peter [name] is a 72 y.o. male with ESRD on HD (HD sessions: MWF) admitted on 04/01/2023 with MSSA bacteremia/osteomyelitis. Pharmacy has been consulted for cefazolin dosing.  -Today is day # 5 of cefazolin, day 12 total abx. -Plans for HD MWF  Plan: Change cefazolin to 2g qHD. 6 total weeks of treatment planned.    Height: 5\' 6"  (167.6 cm) Weight: 108.6 kg (239 lb 6.7 oz) IBW/kg (Calculated) : 63.8  Temp (24hrs), Avg:98.3 F (36.8 C), Min:98 F (36.7 C), Max:98.4 F (36.9 C)  Recent Labs  Lab 04/09/23 0234 04/09/23 2323 04/10/23 0549 04/10/23 1556 04/11/23 0314 04/11/23 0420 04/12/23 0400 04/13/23 0431  WBC 27.0*  --  15.5*  --  12.3*  --  17.5* 14.7*  CREATININE 5.07*   < > 4.54*  4.49* 5.04*  --  5.77* 4.29* 5.50*   < > = values in this interval not displayed.    Estimated Creatinine Clearance: 13.6 mL/min (A) (by C-G formula based on SCr of 5.5 mg/dL (H)).    Allergies  Allergen Reactions   Doxazosin Mesylate     ineffective for BP (10/2014)   Triamterene-Hctz     increased creatinine (see 09/2014 office note)    Antimicrobials this admission: CFTX 7/2 >>7/2 Azith 7/2 >>7/2 Cefazolin 7/2 >> 7/3 , 7/11> Unasyn 7/3 >> 7/4 Nafcillin 7/4 >>7/11 Tobra 7/7 x1- Intra-articular   Microbiology results: 7/2 BCx: MSSA 7/3 MRSA PCR: negative 7/3 Wound Cx: rare Staph aureus 7/4 BCx: NGTD 7/6 Wound Cx: rare Staph aureus 7/8 Wound Cx: few Staph aureus   Thank you for allowing pharmacy to be a part of this patient's care.  Harland German, PharmD Clinical Pharmacist **Pharmacist phone directory can now be found on [email or web address] ([email or web address]).  Listed under [facility].

## 2023-04-14 NOTE — Progress Notes (Signed)
[facility]    Date of Admission:  04/01/2023   Total days of antibiotics - since 7/2          ID: Peter [name] is a 72 y.o. 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 Sitting up in chair. Some swelling to left hand more so than right hand.  Medications:   (feeding supplement) PROSource Plus  30 mL Oral TID BM   amiodarone  200 mg Oral BID   apixaban  5 mg Oral BID   atorvastatin  10 mg Oral QHS   Chlorhexidine Gluconate Cloth  6 each Topical Q0600   Chlorhexidine Gluconate Cloth  6 each Topical Q0600   Chlorhexidine Gluconate Cloth  6 each Topical Q0600   Chlorhexidine Gluconate Cloth  6 each Topical Q0600   Chlorhexidine Gluconate Cloth  6 each Topical Q0600   darbepoetin (ARANESP) injection - DIALYSIS  100 mcg Subcutaneous Q Mon-1800   feeding supplement  237 mL Oral TID BM   ferric citrate  630 mg Oral TID WC   insulin aspart  0-9 Units Subcutaneous Q4H   multivitamin  1 tablet Oral QHS   pantoprazole (PROTONIX) IV  40 mg Intravenous Q12H    Objective: Vital signs in last 24 hours: Temp:  [98 F (36.7 C)-98.4 F (36.9 C)] 98.4 F (36.9 C) (07/15 0832) Pulse Rate:  [72-85] 83 (07/15 1300) Resp:  [17-25] 22 (07/15 1300) BP: (93-192)/(44-118) 177/59 (07/15 1202) SpO2:  [92 %-96 %] 94 % (07/15 1300) Arterial Line BP: (100-137)/(38-50) 125/50 (07/15  0951) Weight:  [108.6 kg] 108.6 kg (07/15 0500)  Physical Exam  Constitutional: He is oriented to person, place, and time. He appears well-developed and well-nourished. No distress.  HENT:  Mouth/Throat: Oropharynx is clear and moist. No oropharyngeal exudate.  Cardiovascular: Normal rate, regular rhythm and normal heart sounds. Exam reveals no gallop and no friction rub.  No murmur heard.  Pulmonary/Chest: Effort normal and breath sounds normal. No respiratory distress. He has no wheezes.  Abdominal: Soft. Bowel sounds are normal. He exhibits no distension. There is no tenderness.  Ext: swelling to upper extremities. Legs wrapped.  Neurological: He is alert and oriented to person, place, and time.  Skin: Skin is warm and dry. No rash noted. No erythema.  Psychiatric: He has a normal mood and affect. His behavior is normal.    Lab Results Recent Labs    04/12/23 0400 04/13/23 0431 04/14/23 1319  WBC 17.5* 14.7* 11.6*  HGB 8.7* 7.8* 7.5*  HCT 23.8* 22.0* 20.9*  NA 128* 126*  --   K 3.6 4.1  --   CL 92* 92*  --   CO2 24 23  --   BUN 29* 45*  --   CREATININE 4.29* 5.50*  --    Liver Panel Recent Labs    04/13/23 0431 04/14/23 0357  PROT 5.9* 6.6  ALBUMIN <1.5*  <  1.5* 1.7*  AST 37 32  ALT 8 7  ALKPHOS 78 77  BILITOT 4.8* 3.5*  BILIDIR 1.2* 1.0*  IBILI 3.6* 2.5*    Microbiology:  Studies/Results: No results found.   Assessment/Plan: Disseminated MSSA infection = continue with cefazolin 2gm IV 8 hr through 8 wks from last debridement.  - will check sed rate and crp  Leukocytosis = normalized  Hypertension = previously on midodrine, stopped this morning. Will need continued monitoring  [facility] for Infectious Diseases Pager: [phone]  04/14/2023, 3:23 PM

## 2023-04-15 DIAGNOSIS — R7881 Bacteremia: Secondary | ICD-10-CM | POA: Diagnosis not present

## 2023-04-15 DIAGNOSIS — B9561 Methicillin susceptible Staphylococcus aureus infection as the cause of diseases classified elsewhere: Secondary | ICD-10-CM | POA: Diagnosis not present

## 2023-04-15 DIAGNOSIS — E8770 Fluid overload, unspecified: Secondary | ICD-10-CM

## 2023-04-15 DIAGNOSIS — I10 Essential (primary) hypertension: Secondary | ICD-10-CM | POA: Diagnosis not present

## 2023-04-15 DIAGNOSIS — D72829 Elevated white blood cell count, unspecified: Secondary | ICD-10-CM | POA: Diagnosis not present

## 2023-04-15 LAB — GLUCOSE, CAPILLARY
Glucose-Capillary: 125 mg/dL — ABNORMAL HIGH (ref 70–99)
Glucose-Capillary: 113 mg/dL — ABNORMAL HIGH (ref 70–99)
Glucose-Capillary: 108 mg/dL — ABNORMAL HIGH (ref 70–99)
Glucose-Capillary: 119 mg/dL — ABNORMAL HIGH (ref 70–99)
Glucose-Capillary: 124 mg/dL — ABNORMAL HIGH (ref 70–99)
Glucose-Capillary: 130 mg/dL — ABNORMAL HIGH (ref 70–99)

## 2023-04-15 LAB — CBC
HCT: 20.6 % — ABNORMAL LOW (ref 39.0–52.0)
Hemoglobin: 7.3 g/dL — ABNORMAL LOW (ref 13.0–17.0)
MCH: 32.9 pg (ref 26.0–34.0)
MCHC: 35.4 g/dL (ref 30.0–36.0)
MCV: 92.8 fL (ref 80.0–100.0)
Platelets: 121 10*3/uL — ABNORMAL LOW (ref 150–400)
RBC: 2.22 MIL/uL — ABNORMAL LOW (ref 4.22–5.81)
RDW: 16.7 % — ABNORMAL HIGH (ref 11.5–15.5)
WBC: 11.2 10*3/uL — ABNORMAL HIGH (ref 4.0–10.5)
nRBC: 0 % (ref 0.0–0.2)

## 2023-04-15 LAB — HEPATIC FUNCTION PANEL
ALT: 8 U/L (ref 0–44)
AST: 36 U/L (ref 15–41)
Albumin: 1.7 g/dL — ABNORMAL LOW (ref 3.5–5.0)
Alkaline Phosphatase: 99 U/L (ref 38–126)
Bilirubin, Direct: 0.7 mg/dL — ABNORMAL HIGH (ref 0.0–0.2)
Indirect Bilirubin: 2.4 mg/dL — ABNORMAL HIGH (ref 0.3–0.9)
Total Bilirubin: 3.1 mg/dL — ABNORMAL HIGH (ref 0.3–1.2)
Total Protein: 6.5 g/dL (ref 6.5–8.1)

## 2023-04-15 MED ORDER — ALBUMIN HUMAN 25 % IV SOLN
INTRAVENOUS | Status: AC
  Administered 2023-04-15: 25 g
  Filled 2023-04-15: qty 100

## 2023-04-15 MED ORDER — HEPARIN SODIUM (PORCINE) 1000 UNIT/ML IJ SOLN
INTRAMUSCULAR | Status: AC
  Filled 2023-04-15: qty 6

## 2023-04-15 MED ORDER — DARBEPOETIN ALFA 100 MCG/0.5ML IJ SOSY
100.0000 ug | PREFILLED_SYRINGE | INTRAMUSCULAR | Status: DC
  Filled 2023-04-15: qty 0.5

## 2023-04-15 MED ORDER — CEFAZOLIN SODIUM-DEXTROSE 1-4 GM/50ML-% IV SOLN
1.0000 g | Freq: Once | INTRAVENOUS | Status: AC
  Administered 2023-04-15: 1 g via INTRAVENOUS
  Filled 2023-04-15: qty 50

## 2023-04-15 NOTE — Progress Notes (Signed)
[facility] Progress Note    Assessment/ Plan:   Sepsis/ aspiration pneumonia / MSSA bacteremia: 1st set Blood Cx 7/2 positive w/ aspirate from knee also + for staph aureus.  AVG without signs of infection. SP washout/ I&D of bilat shoulder and R knee 7/08 per ortho.  ID following, was on nafcillin, now IV ancef. Hypotension - resolved, now off midodrine Hypokalemia - w/ NSVT. Latest K+ 4.1  ESRD: HD is MWF. HD today off schedule, back on MWF sched tomorrow Volume: vol overload.  UF as tolerated Anemia of CKD: fe panel ordered, start ESA 7/17. 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 SP IV Ca. Phos high, cont binders.  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 Pt is not candidate for repair/ replacement at this time per cardiology.  Elevated LFTs - tbili peaked at 12 and trended down A-Fib - on amio, eliquis  Outpatient Dialysis Orders:  MWF [name] 4hr  450/800     97.5kg    3K/2Ca   AVG   15g   Heparin 10,000 bolus - no ESA, Hgb > 12 - No VDRA (recently stopped d/t Ca 10.9)  Subjective:   Patient seen and examined bedside on HD. Tolerating treatment. Had some issues with itchiness. Left arm still swollen. Out of icu now.   Objective:   BP (!) 138/113   Pulse 86   Temp 98 F (36.7 C) (Oral)   Resp 16   Ht 5\' 6"  (1.676 m)   Wt 117.4 kg Comment: Pt refused to remove pillows. CN notified  SpO2 98%   BMI 41.77 kg/m   Intake/Output Summary (Last 24 hours) at 04/15/2023 1058 Last data filed at 04/14/2023 1900 Gross per 24 hour  Intake 220 ml  Output --  Net 220 ml   Weight change: 7.8 kg  Physical Exam: Gen: Ill appearing, NAD CVS: Irregularly irregular Resp: CTA bl Abd: Soft Ext: left>right UE swelling Neuro: awake, alert Dialysis access: R AVG +b/t  Imaging: No results found.  Labs: BMET Recent Labs  Lab 04/09/23 0234 04/09/23 2323 04/10/23 0549 04/10/23 1556 04/11/23 0420  04/12/23 0400 04/13/23 0431  NA 125* 130* 130*  130* 130* 128* 128* 126*  K 3.9 3.5 3.4*  3.4* 3.7 3.7 3.6 4.1  CL 90* 90* 88*  90* 92* 94* 92* 92*  CO2 21* 21* 22  22 21* 20* 24 23  GLUCOSE 112* 178* 162*  163* 164* 106* 116* 91  BUN 47* 34* 37*  37* 42* 48* 29* 45*  CREATININE 5.07* 4.12* 4.54*  4.49* 5.04* 5.77* 4.29* 5.50*  CALCIUM 6.1* 6.9* 6.8*  6.7* 6.8* 6.6* 7.2* 7.8*  PHOS  --  7.0* 7.0*  --  6.2* 4.2 5.6*   CBC Recent Labs  Lab 04/09/23 0234 04/09/23 1319 04/12/23 0400 04/12/23 1345 04/13/23 0431 04/14/23 1319 04/15/23 0210  WBC 27.0*   < > 17.5*  --  14.7* 11.6* 11.2*  NEUTROABS 25.4*  --   --   --   --   --   --   HGB 10.6*   < > 8.7*  --  7.8* 7.5* 7.3*  HCT 30.1*   < > 23.8*  --  22.0* 20.9* 20.6*  MCV 92.6   < > 90.8  --  91.7 94.1 92.8  PLT 211   < > 122* 113* 100* 114* 121*   < > = values in this interval not displayed.  Medications:     (feeding supplement) PROSource Plus  30 mL Oral TID BM   amiodarone  200 mg Oral BID   apixaban  5 mg Oral BID   atorvastatin  10 mg Oral QHS   Chlorhexidine Gluconate Cloth  6 each Topical Q0600   Chlorhexidine Gluconate Cloth  6 each Topical Q0600   Chlorhexidine Gluconate Cloth  6 each Topical Q0600   Chlorhexidine Gluconate Cloth  6 each Topical Q0600   Chlorhexidine Gluconate Cloth  6 each Topical Q0600   darbepoetin (ARANESP) injection - DIALYSIS  100 mcg Subcutaneous Q Mon-1800   feeding supplement  237 mL Oral TID BM   ferric citrate  630 mg Oral TID WC   heparin sodium (porcine)       insulin aspart  0-9 Units Subcutaneous Q4H   multivitamin  1 tablet Oral QHS   pantoprazole (PROTONIX) IV  40 mg Intravenous Q12H      Anthony Sar, MD [facility] 04/15/2023, 10:58 AM

## 2023-04-15 NOTE — Progress Notes (Signed)
PROGRESS NOTE    Peter [name]  MWU:[id number] DOB: 1950-11-13 DOA: 04/01/2023 PCP: [name], MD    Brief Narrative:  72 year old gentleman with history of ESRD on hemodialysis through right upper extremity AV fistula, chronic A-fib on Eliquis, type 2 diabetes, anemia of chronic disease, hypertension, hyperlipidemia, obstructive sleep apnea not on CPAP initially presented from home with weakness, not feeling well, nausea and vomiting and also sustained fall onto the right knee and left shoulder. 7/2, admitted to medical floor for suspected sepsis, fever 102.3.  Blood cultures positive for MSSA bacteremia with unclear source. 7/3, hemodialysis, right knee joint aspiration Overnight hypotension and more somnolent.  Transferred to ICU.  On vasopressors and midodrine. 7/8, I&D of the right knee and bilateral shoulders 7/11, TEE with suspected endocarditis.  Repeat blood cultures negative.  MRI brain negative. 7/16, transferred to medical floor after being off vasopressors. Followed by nephrology and ID.    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  Status post washout of shoulder and knee joint.  Wound care to continue. Blood culture 7/2, MSSA.  Wound culture 7/3 MSSA Blood cultures 7/4, no growth so far.  All wound cultures with rare Staph aureus. Followed by ID.  Currently on cefazolin.  ID recommends cefazolin 2 g IV with hemodialysis for 8 weeks since last debridement of 7/8. Suspected endocarditis due to new severe mitral regurgitation, currently not a surgical candidate because of being very frail and debilitated.  Treating conservatively. Blood pressures has improved.  Now off vasopressors and off midodrine.  ESRD on hemodialysis: Anemia of chronic disease.  Getting hemodialysis as per nephrology.  Volume overload is managed  with ultrafiltration.  Paroxysmal A-fib: On amiodarone.  On Eliquis.  Rate controlled but persistent A-fib.  Pancytopenia: Suspected secondary to sepsis.  Platelet counts are stabilizing.  Start mobilizing with PT OT.  Remove all peripheral lines.  Patient has left IJ in place.  He does not have any other IV lines.  Will keep left IJ that was done on 7/8 for next few days until we achieve hemodynamic stability to use for any emergencies.  His antibiotics are through hemodialysis.  DVT prophylaxis: Place and maintain sequential compression device Start: 04/02/23 1116 apixaban (ELIQUIS) tablet 5 mg   Code Status: Full code Family Communication: Patient's friend at the bedside. Disposition Plan: Status is: Inpatient Remains inpatient appropriate because: Persistent infection, inpatient procedures, anticipate rehab     Consultants:  Nephrology Critical care Infectious disease  Procedures:  Routine hemodialysis, multiple joint surgeries  Antimicrobials:  Multiple antibiotics Cefazolin since 7/2, plans to 8 weeks from 7/8 -9/2   Subjective: Patient seen and examined.  Came back from dialysis.  Tired.  Every joint hurts.  Every joint is swollen.  Hand is swollen.  Pain controlled with oral pain medication.  Objective: Vitals:   04/15/23 1205 04/15/23 1230 04/15/23 1245 04/15/23 1250  BP: (!) 103/44 (!) 85/68 (!) 86/59 99/86  Pulse: 87 100 92 90  Resp: 19 19 20 20   Temp:    98.4 F (36.9 C)  TempSrc:    Oral  SpO2: 97% 96% 98% 98%  Weight:    114.3 kg  Height:        Intake/Output Summary (Last 24 hours) at 04/15/2023 1306 Last data filed at 04/15/2023 1250 Gross per 24 hour  Intake 100 ml  Output 3000 ml  Net -  2900 ml   Filed Weights   04/15/23 0429 04/15/23 0859 04/15/23 1250  Weight: 116.4 kg 117.4 kg 114.3 kg    Examination:  General: Sick looking.  Frail and debilitated.  Not in any distress.  Napping after pain medication. Cardiovascular: Irregularly  irregular.  Controlled rate.  Pansystolic murmur at mitral area. Respiratory: Bilateral clear.  Poor inspiratory effort. SpO2: 98 % O2 Flow Rate (L/min): 2 L/min  Gastrointestinal: Soft.  Nontender.  Bowel sound present. Ext: Anasarca.  2+ edema both upper extremities.  AV fistula right upper extremity. Neuro: Alert awake.  Extremely debilitated.    Data Reviewed: I have personally reviewed following labs and imaging studies  CBC: Recent Labs  Lab 04/09/23 0234 04/09/23 1319 04/11/23 0314 04/11/23 0823 04/12/23 0400 04/12/23 1345 04/13/23 0431 04/14/23 1319 04/15/23 0210  WBC 27.0*   < > 12.3*  --  17.5*  --  14.7* 11.6* 11.2*  NEUTROABS 25.4*  --   --   --   --   --   --   --   --   HGB 10.6*   < > 9.2* 9.4* 8.7*  --  7.8* 7.5* 7.3*  HCT 30.1*   < > 25.7* 26.3* 23.8*  --  22.0* 20.9* 20.6*  MCV 92.6   < > 93.1  --  90.8  --  91.7 94.1 92.8  PLT 211   < > 145*  --  122* 113* 100* 114* 121*   < > = values in this interval not displayed.   Basic Metabolic Panel: Recent Labs  Lab 04/09/23 0234 04/09/23 2323 04/10/23 0549 04/10/23 1556 04/11/23 0314 04/11/23 0420 04/12/23 0400 04/13/23 0431  NA 125* 130* 130*  130* 130*  --  128* 128* 126*  K 3.9 3.5 3.4*  3.4* 3.7  --  3.7 3.6 4.1  CL 90* 90* 88*  90* 92*  --  94* 92* 92*  CO2 21* 21* 22  22 21*  --  20* 24 23  GLUCOSE 112* 178* 162*  163* 164*  --  106* 116* 91  BUN 47* 34* 37*  37* 42*  --  48* 29* 45*  CREATININE 5.07* 4.12* 4.54*  4.49* 5.04*  --  5.77* 4.29* 5.50*  CALCIUM 6.1* 6.9* 6.8*  6.7* 6.8*  --  6.6* 7.2* 7.8*  MG 2.3 2.0 2.1  --  2.2  --  1.8  --   PHOS  --  7.0* 7.0*  --   --  6.2* 4.2 5.6*   GFR: Estimated Creatinine Clearance: 14 mL/min (A) (by C-G formula based on SCr of 5.5 mg/dL (H)). Liver Function Tests: Recent Labs  Lab 04/09/23 0234 04/10/23 0549 04/11/23 0314 04/11/23 0420 04/12/23 0400 04/13/23 0431 04/14/23 0357 04/15/23 0131  AST 55*  --  42*  --   --  37 32 36  ALT  19  --  15  --   --  8 7 8   ALKPHOS 106  --  81  --   --  78 77 99  BILITOT 10.4*  --  9.1*  --   --  4.8* 3.5* 3.1*  PROT 6.8  --  6.4*  --   --  5.9* 6.6 6.5  ALBUMIN <1.5*   < > <1.5* <1.5* <1.5* <1.5*  <1.5* 1.7* 1.7*   < > = values in this interval not displayed.   No results for input(s): "LIPASE", "AMYLASE" in the last 168 hours. Recent Labs  Lab 04/11/23 [phone]  AMMONIA 31   Coagulation Profile: Recent Labs  Lab 04/10/23 0549 04/11/23 0314 04/12/23 1345  INR 2.2* 1.8* 1.6*   Cardiac Enzymes: No results for input(s): "CKTOTAL", "CKMB", "CKMBINDEX", "TROPONINI" in the last 168 hours. BNP (last 3 results) No results for input(s): "PROBNP" in the last 8760 hours. HbA1C: No results for input(s): "HGBA1C" in the last 72 hours. CBG: Recent Labs  Lab 04/14/23 1949 04/15/23 0150 04/15/23 0430 04/15/23 0749 04/15/23 0808  GLUCAP 140* 125* 113* 108* 119*   Lipid Profile: No results for input(s): "CHOL", "HDL", "LDLCALC", "TRIG", "CHOLHDL", "LDLDIRECT" in the last 72 hours. Thyroid Function Tests: No results for input(s): "TSH", "T4TOTAL", "FREET4", "T3FREE", "THYROIDAB" in the last 72 hours. Anemia Panel: Recent Labs    04/14/23 1319  FERRITIN 3,618*  TIBC NOT CALCULATED  IRON 28*   Sepsis Labs: Recent Labs  Lab 04/09/23 0234  PROCALCITON 6.64    Recent Results (from the past 240 hour(s))  Aerobic/Anaerobic Culture w Gram Stain (surgical/deep wound)     Status: None   Collection Time: 04/07/23 10:20 AM   Specimen: Synovial, Left Shoulder; Body Fluid  Result Value Ref Range Status   Specimen Description SYNOVIAL  Final   Special Requests NONE  Final   Gram Stain RARE WBC SEEN NO ORGANISMS SEEN   Final   Culture   Final    FEW STAPHYLOCOCCUS AUREUS NO ANAEROBES ISOLATED Performed at [facility], [address], [place], [place] [address]    Report Status 04/12/2023 FINAL  Final   Organism ID, Bacteria STAPHYLOCOCCUS AUREUS  Final       Susceptibility   Staphylococcus aureus - MIC*    CIPROFLOXACIN <=0.5 SENSITIVE Sensitive     ERYTHROMYCIN <=0.25 SENSITIVE Sensitive     GENTAMICIN <=0.5 SENSITIVE Sensitive     OXACILLIN <=0.25 SENSITIVE Sensitive     TETRACYCLINE <=1 SENSITIVE Sensitive     VANCOMYCIN 1 SENSITIVE Sensitive     TRIMETH/SULFA <=10 SENSITIVE Sensitive     CLINDAMYCIN <=0.25 SENSITIVE Sensitive     RIFAMPIN <=0.5 SENSITIVE Sensitive     Inducible Clindamycin NEGATIVE Sensitive     LINEZOLID 2 SENSITIVE Sensitive     * FEW STAPHYLOCOCCUS AUREUS         Radiology Studies: No results found.      Scheduled Meds:  (feeding supplement) PROSource Plus  30 mL Oral TID BM   amiodarone  200 mg Oral BID   apixaban  5 mg Oral BID   atorvastatin  10 mg Oral QHS   Chlorhexidine Gluconate Cloth  6 each Topical Q0600   Chlorhexidine Gluconate Cloth  6 each Topical Q0600   Chlorhexidine Gluconate Cloth  6 each Topical Q0600   Chlorhexidine Gluconate Cloth  6 each Topical Q0600   Chlorhexidine Gluconate Cloth  6 each Topical Q0600   [START ON 04/23/2023] darbepoetin (ARANESP) injection - DIALYSIS  100 mcg Subcutaneous Q Wed-1800   feeding supplement  237 mL Oral TID BM   ferric citrate  630 mg Oral TID WC   heparin sodium (porcine)       insulin aspart  0-9 Units Subcutaneous Q4H   multivitamin  1 tablet Oral QHS   pantoprazole (PROTONIX) IV  40 mg Intravenous Q12H   Continuous Infusions:  sodium chloride Stopped (04/11/23 0946)   anticoagulant sodium citrate      ceFAZolin (ANCEF) IV Stopped (04/14/23 1639)     LOS: 14 days    Time spent: 40 minutes    Shaylyn Bawa  Jerral Ralph, MD [facility]

## 2023-04-15 NOTE — Plan of Care (Signed)

## 2023-04-15 NOTE — Progress Notes (Signed)
   04/15/23 1250  Vitals  Temp 98.4 F (36.9 C)  Pulse Rate 90  Resp 20  BP 99/86  SpO2 98 %  O2 Device Nasal Cannula  Weight 114.3 kg (pt refused to remove pillow. CN notified.)  Type of Weight Post-Dialysis  Oxygen Therapy  Patient Activity (if Appropriate) In bed  Pulse Oximetry Type Continuous  O2 Flow Rate (L/min) 2 L/min  During Treatment Monitoring  HD Safety Checks Performed Yes  Intra-Hemodialysis Comments Tx completed   Received patient in bed to unit.  Alert and oriented.  Informed consent signed and in chart.   TX duration:3.5hrs  Patient tolerated well.  Transported back to the room  Alert, without acute distress.  Hand-off given to patient's nurse.   Access used: RAVG Access issues: none  Total UF removed: 3L Medication(s) given: albumin 25G   [name]

## 2023-04-15 NOTE — Progress Notes (Signed)
[facility]    Date of Admission:  04/01/2023     ID: Peter [name] is a 72 y.o. male with  disseminated MSSA bacteremia with multiple septic joints s/p I x D. 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 Tolerating HD, but feels it is slow  Medications:   (feeding supplement) PROSource Plus  30 mL Oral TID BM   amiodarone  200 mg Oral BID   apixaban  5 mg Oral BID   atorvastatin  10 mg Oral QHS   Chlorhexidine Gluconate Cloth  6 each Topical Q0600   [START ON 04/23/2023] darbepoetin (ARANESP) injection - DIALYSIS  100 mcg Subcutaneous Q Wed-1800   feeding supplement  237 mL Oral TID BM   ferric citrate  630 mg Oral TID WC   heparin sodium (porcine)       insulin aspart  0-9 Units Subcutaneous Q4H   multivitamin  1 tablet Oral QHS   pantoprazole (PROTONIX) IV  40 mg Intravenous Q12H    Objective: Vital signs in last 24 hours: Temp:  [98 F (36.7 C)-98.5 F (36.9 C)] 98.4 F (36.9 C) (07/16 1250) Pulse Rate:  [78-108] 90 (07/16 1250) Resp:  [16-24] 20 (07/16 1250) BP: (78-152)/(39-122) 99/86 (07/16 1250) SpO2:  [92 %-100 %] 98 % (07/16 1250) Weight:  [114.3 kg-117.4 kg] 114.3 kg (07/16 1250)  Physical Exam  Constitutional: He is oriented to person, place, and time. He appears well-developed and well-nourished. No distress.  HENT:  Mouth/Throat: Oropharynx is clear and moist. No oropharyngeal  exudate.  Cardiovascular: Normal rate, regular rhythm and normal heart sounds. Exam reveals no gallop and no friction rub.  No murmur heard.  Chest wall: left [place] joint fluctuant+ Pulmonary/Chest: Effort normal and breath sounds normal. No respiratory distress. He has no wheezes.  Abdominal: Soft. Bowel sounds are normal. He exhibits no distension. There is no tenderness.  Lymphadenopathy:  He has no cervical adenopathy.  Neurological: He is alert and oriented to person, place, and time.  Skin: Skin is warm and dry. No rash noted. No erythema.  Psychiatric: He has a normal mood and affect. His behavior is normal.    Lab Results Recent Labs    04/13/23 0431 04/14/23 1319 04/15/23 0210  WBC 14.7* 11.6* 11.2*  HGB 7.8* 7.5* 7.3*  HCT 22.0* 20.9* 20.6*  NA 126*  --   --   K 4.1  --   --   CL 92*  --   --   CO2 23  --   --   BUN 45*  --   --   CREATININE 5.50*  --   --    Liver Panel Recent Labs    04/14/23 0357 04/15/23 0131  PROT 6.6 6.5  ALBUMIN 1.7* 1.7*  AST 32 36  ALT 7 8  ALKPHOS 77 99  BILITOT 3.5* 3.1*  BILIDIR 1.0* 0.7*  IBILI 2.5* 2.4*    Microbiology:  reviewed Studies/Results: No results found.   Assessment/Plan: Disseminated MSSA infection with multiple joint involvement/septic arthritis s/p I xD == on exam today, appears to have left [place] joint swelling, that I did not appreciate in prior days, recommend to get imaging of chest wall in order to see if they have septic arthrtiis of left [place] joint.  Continue on cefazolin with HD dosing.  ESRD on HD = helping with treatment of volume overload.   Hyperbilirubinemia = improving.   Pressure injury to sacrum = originally noted on admit but has progressed to stage 3 . Wound care making recommendations for management. As he becomes more mobile and can off load, will be helpful for patient.  [facility] Pager: [phone]  04/15/2023, 5:07 PM

## 2023-04-16 ENCOUNTER — Inpatient Hospital Stay (HOSPITAL_COMMUNITY): Payer: Medicare Other

## 2023-04-16 DIAGNOSIS — B9561 Methicillin susceptible Staphylococcus aureus infection as the cause of diseases classified elsewhere: Secondary | ICD-10-CM | POA: Diagnosis not present

## 2023-04-16 DIAGNOSIS — A419 Sepsis, unspecified organism: Secondary | ICD-10-CM | POA: Diagnosis not present

## 2023-04-16 DIAGNOSIS — E8770 Fluid overload, unspecified: Secondary | ICD-10-CM | POA: Diagnosis not present

## 2023-04-16 DIAGNOSIS — G4733 Obstructive sleep apnea (adult) (pediatric): Secondary | ICD-10-CM

## 2023-04-16 DIAGNOSIS — R7881 Bacteremia: Secondary | ICD-10-CM | POA: Diagnosis not present

## 2023-04-16 DIAGNOSIS — I340 Nonrheumatic mitral (valve) insufficiency: Secondary | ICD-10-CM | POA: Diagnosis not present

## 2023-04-16 DIAGNOSIS — N186 End stage renal disease: Secondary | ICD-10-CM | POA: Diagnosis not present

## 2023-04-16 LAB — RENAL FUNCTION PANEL
Albumin: 1.6 g/dL — ABNORMAL LOW (ref 3.5–5.0)
Anion gap: 14 (ref 5–15)
BUN: 40 mg/dL — ABNORMAL HIGH (ref 8–23)
CO2: 27 mmol/L (ref 22–32)
Calcium: 9.3 mg/dL (ref 8.9–10.3)
Chloride: 90 mmol/L — ABNORMAL LOW (ref 98–111)
Creatinine, Ser: 4.83 mg/dL — ABNORMAL HIGH (ref 0.61–1.24)
GFR, Estimated: 12 mL/min — ABNORMAL LOW (ref >60)
Glucose, Bld: 133 mg/dL — ABNORMAL HIGH (ref 70–99)
Phosphorus: 4.3 mg/dL (ref 2.5–4.6)
Potassium: 4.6 mmol/L (ref 3.5–5.1)
Sodium: 131 mmol/L — ABNORMAL LOW (ref 135–145)

## 2023-04-16 LAB — CBC
HCT: 20.1 % — ABNORMAL LOW (ref 39.0–52.0)
Hemoglobin: 7 g/dL — ABNORMAL LOW (ref 13.0–17.0)
MCH: 33.7 pg (ref 26.0–34.0)
MCHC: 34.8 g/dL (ref 30.0–36.0)
MCV: 96.6 fL (ref 80.0–100.0)
Platelets: 137 10*3/uL — ABNORMAL LOW (ref 150–400)
RBC: 2.08 MIL/uL — ABNORMAL LOW (ref 4.22–5.81)
RDW: 17.6 % — ABNORMAL HIGH (ref 11.5–15.5)
WBC: 6.7 10*3/uL (ref 4.0–10.5)
nRBC: 0 % (ref 0.0–0.2)

## 2023-04-16 LAB — GLUCOSE, CAPILLARY
Glucose-Capillary: 106 mg/dL — ABNORMAL HIGH (ref 70–99)
Glucose-Capillary: 126 mg/dL — ABNORMAL HIGH (ref 70–99)
Glucose-Capillary: 128 mg/dL — ABNORMAL HIGH (ref 70–99)
Glucose-Capillary: 130 mg/dL — ABNORMAL HIGH (ref 70–99)
Glucose-Capillary: 148 mg/dL — ABNORMAL HIGH (ref 70–99)
Glucose-Capillary: 98 mg/dL (ref 70–99)

## 2023-04-16 LAB — HEPATIC FUNCTION PANEL
ALT: 7 U/L (ref 0–44)
AST: 36 U/L (ref 15–41)
Albumin: 1.7 g/dL — ABNORMAL LOW (ref 3.5–5.0)
Alkaline Phosphatase: 95 U/L (ref 38–126)
Bilirubin, Direct: 0.7 mg/dL — ABNORMAL HIGH (ref 0.0–0.2)
Indirect Bilirubin: 2 mg/dL — ABNORMAL HIGH (ref 0.3–0.9)
Total Bilirubin: 2.7 mg/dL — ABNORMAL HIGH (ref 0.3–1.2)
Total Protein: 6.8 g/dL (ref 6.5–8.1)

## 2023-04-16 MED ORDER — ALBUMIN HUMAN 25 % IV SOLN
INTRAVENOUS | Status: AC
  Filled 2023-04-16: qty 100

## 2023-04-16 MED ORDER — IOHEXOL 350 MG/ML SOLN
50.0000 mL | Freq: Once | INTRAVENOUS | Status: AC | PRN
  Administered 2023-04-16: 50 mL via INTRAVENOUS

## 2023-04-16 MED ORDER — HEPARIN SODIUM (PORCINE) 1000 UNIT/ML IJ SOLN
2000.0000 [IU] | Freq: Once | INTRAMUSCULAR | Status: AC
  Administered 2023-04-16: 2000 [IU] via INTRAVENOUS

## 2023-04-16 MED ORDER — HEPARIN SODIUM (PORCINE) 1000 UNIT/ML IJ SOLN
4000.0000 [IU] | Freq: Once | INTRAMUSCULAR | Status: AC
  Administered 2023-04-16: 4000 [IU] via INTRAVENOUS

## 2023-04-16 MED ORDER — ALBUMIN HUMAN 25 % IV SOLN
25.0000 g | Freq: Two times a day (BID) | INTRAVENOUS | Status: DC | PRN
  Administered 2023-04-16 (×2): 25 g via INTRAVENOUS
  Filled 2023-04-16: qty 100

## 2023-04-16 NOTE — Progress Notes (Addendum)
[facility]    Date of Admission:  04/14/2023     ID: Peter [name] is a 72 y.o. 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 Underwent CT of chest this morning. Reading shows possible septic arthritis of left [place] joint, no signs of osteo. Swelling still noted on right upper extremity. Swelling decreasing to left arm.  Medications:   (feeding supplement) PROSource Plus  30 mL Oral TID BM   amiodarone  200 mg Oral BID   apixaban  5 mg Oral BID   atorvastatin  10 mg Oral QHS   Chlorhexidine Gluconate Cloth  6 each Topical Q0600   [START ON 04/23/2023] darbepoetin (ARANESP) injection - DIALYSIS  100 mcg Subcutaneous Q Wed-1800   feeding supplement  237 mL Oral TID BM   ferric citrate  630 mg Oral TID WC   insulin aspart  0-9 Units Subcutaneous Q4H   multivitamin  1 tablet Oral QHS   pantoprazole (PROTONIX) IV  40 mg Intravenous Q12H    Objective: Vital signs in last 24 hours: Temp:  [97.3 F (36.3 C)-100.1 F (37.8 C)] 97.3 F (36.3 C) (07/17 1839) Pulse Rate:  [76-121] 83 (07/17 1839) Resp:  [18-24] 18 (07/17 1848) BP: (85-107)/(40-71) 96/71 (07/17 1839) SpO2:  [92 %-100 %] 92 % (07/17 1839) Weight:  [114.4 kg-117.8 kg] 115.8 kg (07/17 1748) Physical Exam  Constitutional: He is oriented to person, place, and  time. He appears well-developed and well-nourished. No distress.  HENT: L-internal jugular line in place Mouth/Throat: Oropharynx is clear and moist. No oropharyngeal exudate.  Cardiovascular: Normal rate, regular rhythm and normal heart sounds. Exam reveals no gallop and no friction rub.  No murmur heard.  Pulmonary/Chest: Effort normal and breath sounds normal. No respiratory distress. He has no wheezes.  Abdominal: Soft. Bowel sounds are normal. He exhibits no distension. There is no tenderness.  Lymphadenopathy:  He has no cervical adenopathy.  Neurological: He is alert and oriented to person, place, and time.  Skin: Skin is warm and dry. No rash noted. No erythema.  Psychiatric: He has a normal mood and affect. His behavior is normal.    Lab Results Recent Labs    04/15/23 0210 04/16/23 0442 04/16/23 1151  WBC 11.2* 6.7  --   HGB 7.3* 7.0*  --   HCT 20.6* 20.1*  --   NA  --   --  131*  K  --   --  4.6  CL  --   --  90*  CO2  --   --  27  BUN  --   --  40*  CREATININE  --   --  4.83*   Liver Panel Recent Labs    04/15/23 0131 04/16/23 0442 04/16/23 1151  PROT 6.5 6.8  --   ALBUMIN 1.7* 1.7* 1.6*  AST 36 36  --  ALT 8 7  --   ALKPHOS 99 95  --   BILITOT 3.1* 2.7*  --   BILIDIR 0.7* 0.7*  --   IBILI 2.4* 2.0*  --    No results found for: "ESRSEDRATE", "POCTSEDRATE"  Microbiology: reviewed Studies/Results: CT CHEST W CONTRAST  Result Date: 04/16/2023 CLINICAL DATA:  Osteomyelitis suspected, chest. Concern for left septic sternoclavicular arthritis. End-stage renal disease. EXAM: CT CHEST WITH CONTRAST TECHNIQUE: Multidetector CT imaging of the chest was performed during intravenous contrast administration. RADIATION DOSE REDUCTION: This exam was performed according to the departmental dose-optimization program which includes automated exposure control, adjustment of the mA and/or kV according to patient size and/or use of iterative reconstruction technique.  CONTRAST:  50mL OMNIPAQUE IOHEXOL 350 MG/ML SOLN COMPARISON:  No prior chest CT. Chest radiographs 04/07/2023, MRI of the right shoulder 04/05/2023, MRI of the left shoulder 04/03/2023 and CT cervical spine 04/15/2023. FINDINGS: Cardiovascular: Left IJ central venous catheter projects to the level of the confluence of the brachiocephalic veins. No acute vascular findings are demonstrated. There is atherosclerosis of the aorta, great vessels and coronary arteries. There are calcifications of the aortic valve. The heart is mildly enlarged. No significant pericardial effusion. Mediastinum/Nodes: There are no enlarged mediastinal, hilar or axillary lymph nodes.There are scattered small normal-sized mediastinal lymph nodes. The thyroid gland, trachea and esophagus demonstrate no significant findings. Lungs/Pleura: Images through the lung bases are degraded by beam hardening artifact. There are small bilateral pleural effusions with dependent airspace opacities in both lungs. The left lower lobe is completely opacified with associated volume loss. No suspicious nodules within the aerated portions of the lungs. Upper abdomen: No acute or suspicious findings are seen within the visualized upper abdomen. Musculoskeletal/Chest wall: Asymmetric thickening of the left sternoclavicular joint capsule which was incompletely visualized on previous cervical spine CT, but appears mildly progressive. Mild surrounding soft tissue stranding which could reflect inflammation. No focal fluid collection, foreign body or soft tissue emphysema. Specifically, no retrosternal fluid collection. There is no evidence of erosive disease, osteomyelitis, acute fracture or dislocation. Previously demonstrated shoulder joint effusions are incompletely visualized, but persistent. Probable periarticular inflammatory changes on the right, incompletely visualized. Multilevel thoracic spondylosis with endplate osteophytes. Mild generalized chest wall edema  with mild bilateral gynecomastia. Unless specific follow-up recommendations are mentioned in the findings or impression sections, no imaging follow-up of any mentioned incidental findings is recommended. IMPRESSION: 1. Asymmetric thickening of the left sternoclavicular joint capsule with mild surrounding soft tissue stranding, suspicious for septic arthritis. No evidence of erosive disease, osteomyelitis or abscess. 2. Previously demonstrated bilateral shoulder joint effusions are incompletely visualized, but persistent. Probable periarticular inflammatory changes on the right, incompletely visualized. 3. Small bilateral pleural effusions with dependent airspace opacities in both lungs, likely atelectasis. The left lower lobe is completely opacified with associated volume loss and aspiration or pneumonia cannot be excluded. Radiographic follow-up recommended. 4. Mild generalized chest wall edema with mild bilateral gynecomastia. 5.  Aortic Atherosclerosis (ICD10-I70.0). Electronically Signed   By: Carey Bullocks M.D.   On: 04/16/2023 14:57     Assessment/Plan: Disseminated MSSA infection with polyarticular septic arthritis = also likely has left SCjoint septic arthritis, but unlikely to need surgery. Plan to continue with iv cefazolin with HD  Fluid overload = continues to tolerate HD to help - pulled 2L of UF on today's session  I have personally spent 35 minutes involved in face-to-face and non-face-to-face activities for this patient on the day of the visit. Professional time spent  includes the following activities: Preparing to see the patient (review of tests), Obtaining and/or reviewing separately obtained history (admission/discharge record), Performing a medically appropriate examination and/or evaluation , Ordering medications/tests/procedures, referring and communicating with other health care professionals, Documenting clinical information in the EMR, Independently interpreting results (not  separately reported), Communicating results to the patient/family/caregiver, Counseling and educating the patient/family/caregiver and Care coordination (not separately reported).     [facility] Pager: [phone]  04/16/2023, 7:35 PM

## 2023-04-16 NOTE — Procedures (Signed)
I was present at this dialysis session. I have reviewed the session itself and made appropriate changes.  Albumin 25g x 2 prn for sbp <90. Of note, off of midodrine  Filed Weights   04/15/23 1250 04/16/23 0357 04/16/23 1318  Weight: 114.3 kg 114.4 kg 117.8 kg    Recent Labs  Lab 04/16/23 1151  NA 131*  K 4.6  CL 90*  CO2 27  GLUCOSE 133*  BUN 40*  CREATININE 4.83*  CALCIUM 9.3  PHOS 4.3    Recent Labs  Lab 04/14/23 1319 04/15/23 0210 04/16/23 0442  WBC 11.6* 11.2* 6.7  HGB 7.5* 7.3* 7.0*  HCT 20.9* 20.6* 20.1*  MCV 94.1 92.8 96.6  PLT 114* 121* 137*    Scheduled Meds:  (feeding supplement) PROSource Plus  30 mL Oral TID BM   amiodarone  200 mg Oral BID   apixaban  5 mg Oral BID   atorvastatin  10 mg Oral QHS   Chlorhexidine Gluconate Cloth  6 each Topical Q0600   [START ON 04/23/2023] darbepoetin (ARANESP) injection - DIALYSIS  100 mcg Subcutaneous Q Wed-1800   feeding supplement  237 mL Oral TID BM   ferric citrate  630 mg Oral TID WC   heparin sodium (porcine)  2,000 Units Intravenous Once   insulin aspart  0-9 Units Subcutaneous Q4H   multivitamin  1 tablet Oral QHS   pantoprazole (PROTONIX) IV  40 mg Intravenous Q12H   Continuous Infusions:  sodium chloride Stopped (04/11/23 0946)    ceFAZolin (ANCEF) IV Stopped (04/14/23 1639)   PRN Meds:.Place/Maintain arterial line **AND** sodium chloride, acetaminophen, lidocaine (PF), mouth rinse, oxyCODONE   Anthony Sar, MD [facility] 04/16/2023, 2:08 PM

## 2023-04-16 NOTE — Progress Notes (Signed)
Patient ID: Peter [name], male   DOB: [birth year], 72 y.o.   MRN: [id number]    Progress Note from the Palliative Medicine Team at [facility]   Patient Name: Peter [name]        Date: 04/16/2023 DOB: [birth year]  Age: 72 y.o. MRN#: [id number] Attending Physician: Erick Blinks, DO Primary Care Physician: [name], MD Admit Date: 04/01/2023    Extensive chart review has been completed prior to meeting with patient/family  including labs, vital signs, imaging, progress/consult notes, orders, medications and available advance directive documents.   72 yoM with PMH as below, significant for ESRD on MWF iHD via RUE AVF, afib on Eliquis, T2DM, anemia of CKD, HTN, HL, and OSA not on CPAP who presented with weakness from home.   Patient's significant other/[name] reports slow continued physical and functional decline.  Patient has had falls.  Admitted for treatment and stabilization.  Blood cultures positive for MRSA bacteremia  Significant Hospital events 7/2 admitted  7/3 iHD, right knee joint asp 7/8 to OR for I&D of right knee and bilateral shoulders.  7/9 iHD, eliquis restarted  7/10 poor PO intake, increased midodrine, iHD UF 1.5L, eliquis held for concern of bloody stools, WBC up/ afebrile 7/11 MRI brain neg, nafcillin changed to cefazolin, TEE significant for flail mitral valve leaflet with moderate to severe mitral regurgitation, he is not a candidate for repair at this time 7/14 patient remains in the ICU    This NP assessed patient at the bedside as a follow up for palliative medicine needs and emotional support.  Significant Other/ Sarah at bedside  Patient is more awake today, engages in conversation and he is oriented x 3.  Continued education and emotional support regarding patient's multiple co-morbidities; end-stage renal disease/dialysis dependent, sepsis with MRSA bacteremia, protein calorie malnutrition and physical deconditioning.  All are aware  of the seriousness of his current medical situation.  Explored patient's cognitive state prior to this admission and SO verbalizes she saw no concerns.    Education offered on the likely trajectory of patient's current medical situation; multiple comorbidities, high risk for decompensation and need for SNF for short-term rehab on discharge.  Family at bedside understand and agree  Plan is to continue to treat the treatable, all are hopeful for improvement.     Education offered today regarding  the importance of continued conversation with family and their  medical providers regarding overall plan of care and treatment options,  ensuring decisions are within the context of the patients values and GOCs.   Questions and concerns addressed    This nurse practitioner informed  the patient/family  that I will be out of the hospital until Monday morning.  If the patient is still hospitalized I will follow-up at that time.  Call palliative medicine team phone # [phone] with questions or concerns in the interim  Time: 35 minutes  Detailed review of medical records ( labs, imaging, vital signs), medically appropriate exam ( MS, skin, resp)   discussed with treatment team, counseling and education to patient, family, staff, documenting clinical information, medication management, coordination of care    Lorinda Creed NP  Palliative Medicine Team Team Phone # [phone] Pager [phone]

## 2023-04-16 NOTE — Progress Notes (Signed)
[facility] Progress Note    Assessment/ Plan:   Sepsis/ aspiration pneumonia / MSSA bacteremia: 1st set Blood Cx 7/2 positive w/ aspirate from knee also + for staph aureus.  AVG without signs of infection. SP washout/ I&D of bilat shoulder and R knee 7/08 per ortho.  ID following, was on nafcillin, now IV ancef. C/f left SE joint shoulder septic arthritis: CT pending Hypotension - resolved, now off midodrine, remains hemodynamically stable Hypokalemia - w/ NSVT. Latest K+ 4.1  ESRD: HD is MWF. HD yday off schedule, back on MWF sched today Volume: vol overload.  UF as tolerated Anemia of CKD: fe panel ordered, rec'd ESA 7/15. 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 SP IV Ca. Phos high, cont binders.  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 Pt is not candidate for repair/ replacement at this time per cardiology.  Elevated LFTs - tbili peaked at 12 and trended down A-Fib - on amio, eliquis  Outpatient Dialysis Orders:  MWF [name] 4hr  450/800     97.5kg    3K/2Ca   AVG   15g   Heparin 10,000 bolus - no ESA, Hgb > 12 - No VDRA (recently stopped d/t Ca 10.9)  Subjective:   Patient seen and examined bedside on HD. No complaints currently, no acute events. Tolerated HD yesterday with net UF 3L   Objective:   BP (!) 104/53 (BP Location: Left Arm)   Pulse 88   Temp 100.1 F (37.8 C) (Oral)   Resp 19   Ht 5\' 6"  (1.676 m)   Wt 114.4 kg   SpO2 95%   BMI 40.71 kg/m   Intake/Output Summary (Last 24 hours) at 04/16/2023 0951 Last data filed at 04/15/2023 1835 Gross per 24 hour  Intake 50 ml  Output 3000 ml  Net -2950 ml   Weight change: 1 kg  Physical Exam: Gen: Ill appearing, NAD CVS: Irregularly irregular Resp: CTA bl Abd: Soft Ext: left>right UE swelling Neuro: awake, alert Dialysis access: R AVG +b/t  Imaging: No results found.  Labs: BMET Recent Labs  Lab 04/09/23 2323  04/10/23 0549 04/10/23 1556 04/11/23 0420 04/12/23 0400 04/13/23 0431  NA 130* 130*  130* 130* 128* 128* 126*  K 3.5 3.4*  3.4* 3.7 3.7 3.6 4.1  CL 90* 88*  90* 92* 94* 92* 92*  CO2 21* 22  22 21* 20* 24 23  GLUCOSE 178* 162*  163* 164* 106* 116* 91  BUN 34* 37*  37* 42* 48* 29* 45*  CREATININE 4.12* 4.54*  4.49* 5.04* 5.77* 4.29* 5.50*  CALCIUM 6.9* 6.8*  6.7* 6.8* 6.6* 7.2* 7.8*  PHOS 7.0* 7.0*  --  6.2* 4.2 5.6*   CBC Recent Labs  Lab 04/13/23 0431 04/14/23 1319 04/15/23 0210 04/16/23 0442  WBC 14.7* 11.6* 11.2* 6.7  HGB 7.8* 7.5* 7.3* 7.0*  HCT 22.0* 20.9* 20.6* 20.1*  MCV 91.7 94.1 92.8 96.6  PLT 100* 114* 121* 137*    Medications:     (feeding supplement) PROSource Plus  30 mL Oral TID BM   amiodarone  200 mg Oral BID   apixaban  5 mg Oral BID   atorvastatin  10 mg Oral QHS   Chlorhexidine Gluconate Cloth  6 each Topical Q0600   [START ON 04/23/2023] darbepoetin (ARANESP) injection - DIALYSIS  100 mcg Subcutaneous Q Wed-1800   feeding supplement  237 mL Oral TID BM   ferric citrate  630 mg Oral TID WC   insulin aspart  0-9 Units Subcutaneous Q4H   multivitamin  1 tablet Oral QHS   pantoprazole (PROTONIX) IV  40 mg Intravenous Q12H      Anthony Sar, MD [facility] 04/16/2023, 9:51 AM

## 2023-04-16 NOTE — Progress Notes (Signed)
Physical Therapy Treatment Patient Details Name: Peter [name] MRN: [id number] DOB: Sep 29, 1951 Today's Date: 04/16/2023   History of Present Illness Pt is a 72 y.o. male admitted 04/01/23 after presenting to ED with generalized weakness, nausea, vomiting and intermittent fevers and chills since Sunday morning. Pt subsequently found have aspiration pneumonia and found to be septic with blood cultures positive for Staph aureus, BCID with MSSA. Pt further reported falling on Sunday evening, unable to get up and on the floor for a few hours, bruising and swelling noted around Left eye/face. PMH significant of ESRD on HD MWF, Type 2 DM, anemia of CKD, hypertension, paroxysmal atrial fibrillation, and hyperlipidemia.    PT Comments  Patient seen with OT for focus on sitting balance and participating in functional ADL with L UE.  Still very limited with pain in R knee, R wrist, L shoulder and back during all mobility and limiting progress.  Patient for transport to dialysis so limited to EOB not OOB today.  Patient unable to reach goals as set on initial evaluation due to pain and medical issues.  Goals downgraded this session.  PT will continue to follow in the acute setting.  Recommend post acute inpatient rehab, < 3hours/day at d/c.      Assistance Recommended at Discharge Frequent or constant Supervision/Assistance  If plan is discharge home, recommend the following:  Can travel by private vehicle    Two people to help with walking and/or transfers;A lot of help with bathing/dressing/bathroom;Assistance with cooking/housework;Assist for transportation;Help with stairs or ramp for entrance;Direct supervision/assist for medications management   No  Equipment Recommendations  None recommended by PT    Recommendations for Other Services       Precautions / Restrictions Precautions Precautions: Fall Precaution Comments: R knee, R wrist pain and L shoulder pain     Mobility  Bed  Mobility Overal bed mobility: Needs Assistance Bed Mobility: Rolling, Supine to Sit, Sit to Supine Rolling: Max assist, +2 for physical assistance   Supine to sit: +2 for physical assistance, Total assist, HOB elevated Sit to supine: +2 for physical assistance, Total assist   General bed mobility comments: assist for all aspects    Transfers                        Ambulation/Gait                   Stairs             Wheelchair Mobility     Tilt Bed    Modified Rankin (Stroke Patients Only)       Balance Overall balance assessment: Needs assistance Sitting-balance support: Feet supported Sitting balance-Leahy Scale: Poor Sitting balance - Comments: sat x 20 minutes with min to max support                                    Cognition Arousal/Alertness: Awake/alert Behavior During Therapy: Flat affect Overall Cognitive Status: Impaired/Different from baseline Area of Impairment: Attention, Following commands, Memory, Problem solving                   Current Attention Level: Sustained Memory: Decreased short-term memory Following Commands: Follows one step commands with increased time Safety/Judgement: Decreased awareness of safety, Decreased awareness of deficits   Problem Solving: Slow processing, Decreased initiation  Exercises      General Comments General comments (skin integrity, edema, etc.): Worked with OT on self feeding using tubing on utensils and L hand since R hand weaker; needing hand over hand and increased time with limited sitting tolerance needing to rest back on PT behind during much of session while focusing on using hand for feeding.      Pertinent Vitals/Pain Pain Assessment Pain Assessment: 0-10 Pain Score: 7  Pain Location: generalized, mostly R knee, back Pain Descriptors / Indicators: Aching, Discomfort, Grimacing, Guarding Pain Intervention(s): Monitored during session, RN  gave pain meds during session    Home Living                          Prior Function            PT Goals (current goals can now be found in the care plan section) Acute Rehab PT Goals Patient Stated Goal: go home PT Goal Formulation: With patient/family Time For Goal Achievement: 04/30/23 Potential to Achieve Goals: Fair Progress towards PT goals: Goals downgraded-see care plan;Not progressing toward goals - comment    Frequency    Min 3X/week      PT Plan Current plan remains appropriate    Co-evaluation PT/OT/SLP Co-Evaluation/Treatment: Yes Reason for Co-Treatment: Complexity of the patient's impairments (multi-system involvement);For patient/therapist safety PT goals addressed during session: Mobility/safety with mobility;Balance OT goals addressed during session: ADL's and self-care;Strengthening/ROM      AM-PAC PT "6 Clicks" Mobility   Outcome Measure  Help needed turning from your back to your side while in a flat bed without using bedrails?: Total Help needed moving from lying on your back to sitting on the side of a flat bed without using bedrails?: Total Help needed moving to and from a bed to a chair (including a wheelchair)?: Total Help needed standing up from a chair using your arms (e.g., wheelchair or bedside chair)?: Total Help needed to walk in hospital room?: Total Help needed climbing 3-5 steps with a railing? : Total 6 Click Score: 6    End of Session Equipment Utilized During Treatment: Oxygen Activity Tolerance: Patient limited by pain Patient left: in bed;with call bell/phone within reach;with family/visitor present   PT Visit Diagnosis: Muscle weakness (generalized) (M62.81);History of falling (Z91.81);Other abnormalities of gait and mobility (R26.89);Pain Pain - Right/Left: Right Pain - part of body: Knee     Time: 1208-1241 PT Time Calculation (min) (ACUTE ONLY): 33 min  Charges:    $Therapeutic Activity: 8-22 mins PT  General Charges $$ ACUTE PT VISIT: 1 Visit                     Sheran Lawless, PT Acute Rehabilitation Services Office:[phone] 04/16/2023    Peter [name] 04/16/2023, 5:02 PM

## 2023-04-16 NOTE — Progress Notes (Signed)
Received patient in bed to unit.  Alert and oriented.  Informed consent signed and in chart.   TX duration:3.5  Patient tolerated well.  Transported back to the room  Alert, without acute distress.  Hand-off given to patient's nurse.   Access used: left AVF Access issues: none  Total UF removed: 2000 Medication(s) given: cefazolin   04/16/23 1730  Vitals  BP (!) 93/49  MAP (mmHg) (!) 63  BP Location Left Arm  BP Method Automatic  Patient Position (if appropriate) Lying  Pulse Rate 89  Pulse Rate Source Monitor  ECG Heart Rate 88  Resp 20  Oxygen Therapy  SpO2 97 %  O2 Device Nasal Cannula  During Treatment Monitoring  HD Safety Checks Performed Yes  Intra-Hemodialysis Comments Tx completed  Dialysis Fluid Bolus Normal Saline  Bolus Amount (mL) 300 mL      Mellissa Conley [facility]

## 2023-04-16 NOTE — Plan of Care (Signed)
  Problem: Education: Goal: Knowledge of General Education information will improve Description: Including pain rating scale, medication(s)/side effects and non-pharmacologic comfort measures Outcome: Progressing   Problem: Clinical Measurements: Goal: Ability to maintain clinical measurements within normal limits will improve Outcome: Progressing Goal: Will remain free from infection Outcome: Progressing Goal: Diagnostic test results will improve Outcome: Progressing Goal: Respiratory complications will improve Outcome: Progressing Goal: Cardiovascular complication will be avoided Outcome: Progressing   Problem: Safety: Goal: Ability to remain free from injury will improve Outcome: Progressing   Problem: Fluid Volume: Goal: Hemodynamic stability will improve Outcome: Progressing

## 2023-04-16 NOTE — Progress Notes (Signed)
L internal jugular DL with reports of no blood return. After pulsatile flush GBR noted from both ports. Also noted that tip of line was suboptimally positioned at the innominate/SVC conjunction. Staff encouraged to contact [facility] if other issues arise from this device.

## 2023-04-16 NOTE — Progress Notes (Signed)
Pt reports itching to legs under unna boots. Wife reports unna boots have not been changed in a few days. Per review of chart, they were placed on 7/13.   Per order paged ortho tech for unna boot change but no respone. Per charge RN ortho tech may not be here tonight.   Pt requests unna boots removed due to itching. RN removed unna boots and washed and dried legs/feet. Pt appreciative and reports relief in itching of legs.   Will pass on in report to day RN to page day ortho tech for [employer] replacement.

## 2023-04-16 NOTE — Progress Notes (Signed)
Occupational Therapy Treatment Patient Details Name: Peter [name] MRN: [id number] DOB: November 05, 1950 Today's Date: 04/16/2023   History of present illness Pt is a 72 y.o. male admitted 04/01/23 after presenting to ED with generalized weakness, nausea, vomiting and intermittent fevers and chills since Sunday morning. Pt subsequently found have aspiration pneumonia and found to be septic with blood cultures positive for Staph aureus, BCID with MSSA. Pt further reported falling on Sunday evening, unable to get up and on the floor for a few hours, bruising and swelling noted around Left eye/face. PMH significant of ESRD on HD MWF, Type 2 DM, anemia of CKD, hypertension, paroxysmal atrial fibrillation, and hyperlipidemia.   OT comments  Pt with moaning throughout session with generalized pain, but particularly in R knee and back today. Pt requiring +2 max to total assist for bed mobility. Tolerated supported sitting at EOB x 20 minutes during lunch. Pt able to bring cup with straw to mouth with L hand with support under elbow. Needing max assist for utensil use with foam build up. Minimal interest in food. Requesting his back to be scratched, applied lotion. Patient will benefit from continued inpatient follow up therapy, <3 hours/day.     Recommendations for follow up therapy are one component of a multi-disciplinary discharge planning process, led by the attending physician.  Recommendations may be updated based on patient status, additional functional criteria and insurance authorization.    Assistance Recommended at Discharge Frequent or constant Supervision/Assistance  Patient can return home with the following  Two people to help with walking and/or transfers;Two people to help with bathing/dressing/bathroom;Assistance with cooking/housework;Assistance with feeding;Direct supervision/assist for medications management;Direct supervision/assist for financial management;Assist for transportation;Help  with stairs or ramp for entrance   Equipment Recommendations  Other (comment) (defer to next venue)    Recommendations for Other Services      Precautions / Restrictions Precautions Precautions: Fall Precaution Comments: R knee, R wrist pain and L shoulder pain       Mobility Bed Mobility Overal bed mobility: Needs Assistance Bed Mobility: Rolling, Supine to Sit, Sit to Supine Rolling: Max assist, +2 for physical assistance   Supine to sit: +2 for physical assistance, Total assist Sit to supine: +2 for physical assistance, Total assist   General bed mobility comments: assist for all aspects    Transfers                   General transfer comment: deferred due to impending HD     Balance Overall balance assessment: Needs assistance Sitting-balance support: Feet supported   Sitting balance - Comments: sat x 20 minutes with min to max support                                   ADL either performed or assessed with clinical judgement   ADL Overall ADL's : Needs assistance/impaired Eating/Feeding: Moderate assistance;Sitting Eating/Feeding Details (indicate cue type and reason): can bring cup to mouth and drink from straw with cup placed, assist at elbow and to scoot with spoon, minimal interest in eating                 Lower Body Dressing: Total assistance;Bed level;+2 for physical assistance Lower Body Dressing Details (indicate cue type and reason): socks                    Extremity/Trunk Assessment  Vision       Perception     Praxis      Cognition   Behavior During Therapy: Flat affect Overall Cognitive Status: Impaired/Different from baseline Area of Impairment: Attention, Following commands, Memory, Problem solving                   Current Attention Level: Sustained Memory: Decreased short-term memory Following Commands: Follows one step commands with increased time     Problem  Solving: Slow processing, Decreased initiation          Exercises Other Exercises Other Exercises: provided new strengthening blocks, yellow for R hand, green for L hand    Shoulder Instructions       General Comments      Pertinent Vitals/ Pain       Pain Assessment Pain Assessment: 0-10 Pain Score: 7  Pain Location: generalized, mostly R knee, back Pain Descriptors / Indicators: Aching, Discomfort, Grimacing, Guarding Pain Intervention(s): Repositioned, RN gave pain meds during session  Home Living                                          Prior Functioning/Environment              Frequency  Min 1X/week        Progress Toward Goals  OT Goals(current goals can now be found in the care plan section)  Progress towards OT goals: Progressing toward goals  Acute Rehab OT Goals OT Goal Formulation: With patient Time For Goal Achievement: 04/30/23 Potential to Achieve Goals: Fair  Plan Discharge plan remains appropriate;Frequency remains appropriate    Co-evaluation    PT/OT/SLP Co-Evaluation/Treatment: Yes Reason for Co-Treatment: Complexity of the patient's impairments (multi-system involvement);For patient/therapist safety   OT goals addressed during session: ADL's and self-care;Strengthening/ROM      AM-PAC OT "6 Clicks" Daily Activity     Outcome Measure   Help from another person eating meals?: A Lot Help from another person taking care of personal grooming?: Total Help from another person toileting, which includes using toliet, bedpan, or urinal?: Total Help from another person bathing (including washing, rinsing, drying)?: A Lot Help from another person to put on and taking off regular upper body clothing?: A Lot Help from another person to put on and taking off regular lower body clothing?: Total 6 Click Score: 9    End of Session Equipment Utilized During Treatment: Oxygen  OT Visit Diagnosis: History of falling  (Z91.81);Other abnormalities of gait and mobility (R26.89);Muscle weakness (generalized) (M62.81);Ataxia, unspecified (R27.0);Pain   Activity Tolerance Patient tolerated treatment well   Patient Left in bed;with call bell/phone within reach;with family/visitor present   Nurse Communication Patient requests pain meds        Time: 6440-3474 OT Time Calculation (min): 27 min  Charges: OT General Charges $OT Visit: 1 Visit OT Treatments $Self Care/Home Management : 8-22 mins  Berna Spare, OTR/L Acute Rehabilitation Services Office: [phone]   Kelby, Adell 04/16/2023, 1:15 PM

## 2023-04-16 NOTE — Progress Notes (Signed)
Orthopedic Tech Progress Note Patient Details:  Peter [name] 12/28/1950 [id number]  Ortho Devices Type of Ortho Device: Ace wrap, Unna boot Ortho Device/Splint Location: BLE Ortho Device/Splint Interventions: Ordered, Application, Adjustment   Post Interventions Patient Tolerated: Well Instructions Provided: Care of device  [name] 04/16/2023, 11:24 AM

## 2023-04-16 NOTE — Progress Notes (Signed)
PROGRESS NOTE    Peter [name]  GNF:[id number] DOB: [birth year] DOA: 04/01/2023 PCP: [name], MD   Brief Narrative:    72 year old gentleman with history of ESRD on hemodialysis through right upper extremity AV fistula, chronic A-fib on Eliquis, type 2 diabetes, anemia of chronic disease, hypertension, hyperlipidemia, obstructive sleep apnea not on CPAP initially presented from home with weakness, not feeling well, nausea and vomiting and also sustained fall onto the right knee and left shoulder. 7/2, admitted to medical floor for suspected sepsis, fever 102.3.  Blood cultures positive for MSSA bacteremia with unclear source. 7/3, hemodialysis, right knee joint aspiration Overnight hypotension and more somnolent.  Transferred to ICU.  On vasopressors and midodrine. 7/8, I&D of the right knee and bilateral shoulders 7/11, TEE with suspected endocarditis.  Repeat blood cultures negative.  MRI brain negative. 7/16-7/17, transferred to medical floor after being off vasopressors. Followed by nephrology and ID.  CT chest with contrast ordered and pending.  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  Status post washout of shoulder and knee joint.   Wound care to continue. Blood culture 7/2, MSSA.  Wound culture 7/3 MSSA Blood cultures 7/4, no growth so far.  All wound cultures with rare Staph aureus. Followed by ID.  Currently on cefazolin.  ID recommends cefazolin 2 g IV with hemodialysis for 8 weeks since last debridement of 7/8. Suspected endocarditis due to new severe mitral regurgitation, currently not a surgical candidate because of being very frail and debilitated.  Treating conservatively. ID has ordered CT chest with contrast with results pending 7/17 Blood pressures currently stable   ESRD on hemodialysis: Anemia of chronic disease.  Getting hemodialysis as per nephrology.  Volume overload is managed with ultrafiltration. -Further hemodialysis 7/17   Paroxysmal A-fib: On amiodarone.  On Eliquis.  Rate controlled but persistent A-fib.   Pancytopenia: Suspected secondary to sepsis.  Platelet counts are stabilizing.   Start mobilizing with PT OT.  Remove all peripheral lines.  Patient has left IJ in place.  He does not have any other IV lines.  Will keep left IJ that was done on 7/8 for next few days until we achieve hemodynamic stability to use for any emergencies.  His antibiotics are through hemodialysis.   DVT prophylaxis: Place and maintain sequential compression device Start: 04/02/23 1116 apixaban (ELIQUIS) tablet 5 mg    Code Status: Full code Family Communication: None at bedside Disposition Plan: Status is: Inpatient Remains inpatient appropriate because: Persistent infection, inpatient procedures, anticipate rehab       Consultants:  Nephrology Critical care Infectious disease   Procedures:  Routine hemodialysis, multiple joint surgeries   Antimicrobials:  Multiple antibiotics Cefazolin since 7/2, plans to 8 weeks from 7/8 -9/2    Subjective: Patient seen and evaluated today with  no new acute complaints or concerns. No acute concerns or events noted overnight.  Planned for further hemodialysis today as  well as CT chest with contrast per ID.  Objective: Vitals:   04/15/23 2355 04/16/23 0357 04/16/23 0400 04/16/23 0520  BP: (!) 101/52  (!) 104/57   Pulse: 97  90 90  Resp: 19  (!) 21 19  Temp: 98.6 F (37 C)  99.8 F (37.7 C)   TempSrc: Oral  Oral   SpO2: 95%  94% 98%  Weight:  114.4 kg    Height:        Intake/Output Summary (Last 24 hours) at 04/16/2023 0653 Last data filed at 04/15/2023 1835 Gross per 24 hour  Intake 250 ml  Output 3000 ml  Net -2750 ml   Filed Weights   04/15/23 0859 04/15/23 1250 04/16/23 0357  Weight: 117.4 kg 114.3 kg 114.4 kg    Examination:  General exam: Appears calm and comfortable  Respiratory system: Clear to auscultation. Respiratory effort normal. Cardiovascular system: S1 & S2 heard, RRR.  Gastrointestinal system: Abdomen is soft Central nervous system: Alert and awake Extremities: No edema Skin: No significant lesions noted Psychiatry: Flat affect.    Data Reviewed: I have personally reviewed following labs and imaging studies  CBC: Recent Labs  Lab 04/12/23 0400 04/12/23 1345 04/13/23 0431 04/14/23 1319 04/15/23 0210 04/16/23 0442  WBC 17.5*  --  14.7* 11.6* 11.2* 6.7  HGB 8.7*  --  7.8* 7.5* 7.3* 7.0*  HCT 23.8*  --  22.0* 20.9* 20.6* 20.1*  MCV 90.8  --  91.7 94.1 92.8 96.6  PLT 122* 113* 100* 114* 121* 137*   Basic Metabolic Panel: Recent Labs  Lab 04/09/23 2323 04/10/23 0549 04/10/23 1556 04/11/23 0314 04/11/23 0420 04/12/23 0400 04/13/23 0431  NA 130* 130*  130* 130*  --  128* 128* 126*  K 3.5 3.4*  3.4* 3.7  --  3.7 3.6 4.1  CL 90* 88*  90* 92*  --  94* 92* 92*  CO2 21* 22  22 21*  --  20* 24 23  GLUCOSE 178* 162*  163* 164*  --  106* 116* 91  BUN 34* 37*  37* 42*  --  48* 29* 45*  CREATININE 4.12* 4.54*  4.49* 5.04*  --  5.77* 4.29* 5.50*  CALCIUM 6.9* 6.8*  6.7* 6.8*  --  6.6* 7.2* 7.8*  MG 2.0 2.1  --  2.2  --  1.8  --   PHOS 7.0* 7.0*  --   --  6.2* 4.2 5.6*   GFR: Estimated Creatinine  Clearance: 14 mL/min (A) (by C-G formula based on SCr of 5.5 mg/dL (H)). Liver Function Tests: Recent Labs  Lab 04/11/23 0314 04/11/23 0420 04/12/23 0400 04/13/23 0431 04/14/23 0357 04/15/23 0131 04/16/23 0442  AST 42*  --   --  37 32 36 36  ALT 15  --   --  8 7 8 7   ALKPHOS 81  --   --  78 77 99 95  BILITOT 9.1*  --   --  4.8* 3.5* 3.1* 2.7*  PROT 6.4*  --   --  5.9* 6.6 6.5 6.8  ALBUMIN <1.5*   < > <1.5* <1.5*  <1.5* 1.7* 1.7* 1.7*   < > = values in this interval not displayed.   No results for input(s): "LIPASE", "AMYLASE" in the last 168 hours. Recent Labs  Lab 04/11/23 0823  AMMONIA 31   Coagulation Profile: Recent Labs  Lab 04/10/23  5784 04/11/23 0314 04/12/23 1345  INR 2.2* 1.8* 1.6*   Cardiac Enzymes: No results for input(s): "CKTOTAL", "CKMB", "CKMBINDEX", "TROPONINI" in the last 168 hours. BNP (last 3 results) No results for input(s): "PROBNP" in the last 8760 hours. HbA1C: No results for input(s): "HGBA1C" in the last 72 hours. CBG: Recent Labs  Lab 04/15/23 0808 04/15/23 1704 04/15/23 2048 04/16/23 0000 04/16/23 0354  GLUCAP 119* 124* 130* 148* 126*   Lipid Profile: No results for input(s): "CHOL", "HDL", "LDLCALC", "TRIG", "CHOLHDL", "LDLDIRECT" in the last 72 hours. Thyroid Function Tests: No results for input(s): "TSH", "T4TOTAL", "FREET4", "T3FREE", "THYROIDAB" in the last 72 hours. Anemia Panel: Recent Labs    04/14/23 1319  FERRITIN 3,618*  TIBC NOT CALCULATED  IRON 28*   Sepsis Labs: No results for input(s): "PROCALCITON", "LATICACIDVEN" in the last 168 hours.  Recent Results (from the past 240 hour(s))  Aerobic/Anaerobic Culture w Gram Stain (surgical/deep wound)     Status: None   Collection Time: 04/07/23 10:20 AM   Specimen: Synovial, Left Shoulder; Body Fluid  Result Value Ref Range Status   Specimen Description SYNOVIAL  Final   Special Requests NONE  Final   Gram Stain RARE WBC SEEN NO ORGANISMS SEEN   Final    Culture   Final    FEW STAPHYLOCOCCUS AUREUS NO ANAEROBES ISOLATED Performed at [facility], [address], [place], [place] [address]    Report Status 04/12/2023 FINAL  Final   Organism ID, Bacteria STAPHYLOCOCCUS AUREUS  Final      Susceptibility   Staphylococcus aureus - MIC*    CIPROFLOXACIN <=0.5 SENSITIVE Sensitive     ERYTHROMYCIN <=0.25 SENSITIVE Sensitive     GENTAMICIN <=0.5 SENSITIVE Sensitive     OXACILLIN <=0.25 SENSITIVE Sensitive     TETRACYCLINE <=1 SENSITIVE Sensitive     VANCOMYCIN 1 SENSITIVE Sensitive     TRIMETH/SULFA <=10 SENSITIVE Sensitive     CLINDAMYCIN <=0.25 SENSITIVE Sensitive     RIFAMPIN <=0.5 SENSITIVE Sensitive     Inducible Clindamycin NEGATIVE Sensitive     LINEZOLID 2 SENSITIVE Sensitive     * FEW STAPHYLOCOCCUS AUREUS         Radiology Studies: No results found.      Scheduled Meds:  (feeding supplement) PROSource Plus  30 mL Oral TID BM   amiodarone  200 mg Oral BID   apixaban  5 mg Oral BID   atorvastatin  10 mg Oral QHS   Chlorhexidine Gluconate Cloth  6 each Topical Q0600   [START ON 04/23/2023] darbepoetin (ARANESP) injection - DIALYSIS  100 mcg Subcutaneous Q Wed-1800   feeding supplement  237 mL Oral TID BM   ferric citrate  630 mg Oral TID WC   insulin aspart  0-9 Units Subcutaneous Q4H   multivitamin  1 tablet Oral QHS   pantoprazole (PROTONIX) IV  40 mg Intravenous Q12H   Continuous Infusions:  sodium chloride Stopped (04/11/23 0946)    ceFAZolin (ANCEF) IV Stopped (04/14/23 1639)     LOS: 15 days    Time spent: 35 minutes    Olander Friedl Hoover Brunette, DO [facility]  If 7PM-7AM, please contact night-coverage [email or web address] 04/16/2023, 6:54 AM

## 2023-04-17 DIAGNOSIS — R7881 Bacteremia: Secondary | ICD-10-CM | POA: Diagnosis not present

## 2023-04-17 DIAGNOSIS — B9561 Methicillin susceptible Staphylococcus aureus infection as the cause of diseases classified elsewhere: Secondary | ICD-10-CM | POA: Diagnosis not present

## 2023-04-17 LAB — CBC
HCT: 19.3 % — ABNORMAL LOW (ref 39.0–52.0)
Hemoglobin: 6.5 g/dL — CL (ref 13.0–17.0)
MCH: 33 pg (ref 26.0–34.0)
MCHC: 33.7 g/dL (ref 30.0–36.0)
MCV: 98 fL (ref 80.0–100.0)
Platelets: 141 10*3/uL — ABNORMAL LOW (ref 150–400)
RBC: 1.97 MIL/uL — ABNORMAL LOW (ref 4.22–5.81)
RDW: 18.8 % — ABNORMAL HIGH (ref 11.5–15.5)
WBC: 5.4 10*3/uL (ref 4.0–10.5)
nRBC: 0 % (ref 0.0–0.2)

## 2023-04-17 LAB — BLOOD GAS, VENOUS
Acid-Base Excess: 8.1 mmol/L — ABNORMAL HIGH (ref 0.0–2.0)
Bicarbonate: 32 mmol/L — ABNORMAL HIGH (ref 20.0–28.0)
Drawn by: 299171
O2 Saturation: 74.5 %
Patient temperature: 38.2
pCO2, Ven: 43 mmHg — ABNORMAL LOW (ref 44–60)
pH, Ven: 7.48 — ABNORMAL HIGH (ref 7.25–7.43)
pO2, Ven: 48 mmHg — ABNORMAL HIGH (ref 32–45)

## 2023-04-17 LAB — MAGNESIUM: Magnesium: 1.8 mg/dL (ref 1.7–2.4)

## 2023-04-17 LAB — HEPATIC FUNCTION PANEL
ALT: 6 U/L (ref 0–44)
AST: 36 U/L (ref 15–41)
Albumin: 2.1 g/dL — ABNORMAL LOW (ref 3.5–5.0)
Alkaline Phosphatase: 78 U/L (ref 38–126)
Bilirubin, Direct: 0.7 mg/dL — ABNORMAL HIGH (ref 0.0–0.2)
Indirect Bilirubin: 1.8 mg/dL — ABNORMAL HIGH (ref 0.3–0.9)
Total Bilirubin: 2.5 mg/dL — ABNORMAL HIGH (ref 0.3–1.2)
Total Protein: 7 g/dL (ref 6.5–8.1)

## 2023-04-17 LAB — GLUCOSE, CAPILLARY
Glucose-Capillary: 112 mg/dL — ABNORMAL HIGH (ref 70–99)
Glucose-Capillary: 113 mg/dL — ABNORMAL HIGH (ref 70–99)
Glucose-Capillary: 120 mg/dL — ABNORMAL HIGH (ref 70–99)
Glucose-Capillary: 146 mg/dL — ABNORMAL HIGH (ref 70–99)
Glucose-Capillary: 147 mg/dL — ABNORMAL HIGH (ref 70–99)
Glucose-Capillary: 98 mg/dL (ref 70–99)

## 2023-04-17 LAB — PREPARE RBC (CROSSMATCH)

## 2023-04-17 LAB — HEMOGLOBIN AND HEMATOCRIT, BLOOD
HCT: 25.8 % — ABNORMAL LOW (ref 39.0–52.0)
Hemoglobin: 8.8 g/dL — ABNORMAL LOW (ref 13.0–17.0)

## 2023-04-17 MED ORDER — MIDODRINE HCL 5 MG PO TABS: 5.0000 mg | ORAL_TABLET | ORAL | Status: DC

## 2023-04-17 MED ORDER — MIDODRINE HCL 5 MG PO TABS
10.0000 mg | ORAL_TABLET | ORAL | Status: AC
  Administered 2023-04-17: 10 mg via ORAL
  Filled 2023-04-17: qty 2

## 2023-04-17 MED ORDER — HYDROXYZINE HCL 25 MG PO TABS
25.0000 mg | ORAL_TABLET | Freq: Once | ORAL | Status: AC
  Administered 2023-04-18: 25 mg via ORAL
  Filled 2023-04-17: qty 1

## 2023-04-17 MED ORDER — MIDODRINE HCL 5 MG PO TABS
5.0000 mg | ORAL_TABLET | Freq: Three times a day (TID) | ORAL | Status: DC
  Administered 2023-04-17 – 2023-04-18 (×3): 5 mg via ORAL
  Filled 2023-04-17 (×3): qty 1

## 2023-04-17 MED ORDER — SODIUM CHLORIDE 0.9% IV SOLUTION
Freq: Once | INTRAVENOUS | Status: AC
  Administered 2023-04-17: 10 mL via INTRAVENOUS

## 2023-04-17 NOTE — Plan of Care (Signed)
  Problem: Clinical Measurements: Goal: Respiratory complications will improve Outcome: Progressing Goal: Cardiovascular complication will be avoided Outcome: Progressing   Problem: Activity: Goal: Risk for activity intolerance will decrease Outcome: Not Progressing Note: Pain with minimal movement in bed. Edema and weeping generalized.

## 2023-04-17 NOTE — TOC Progression Note (Signed)
Transition of Care [facility]) - Progression Note    Patient Details  Name: Eero Dini MRN: [id number] Date of Birth: 10/11/1950  [facility]) CM/SW Contact  Leander Rams, LCSW Phone Number: 04/17/2023, 11:14 AM  Clinical Narrative:    CSW met with pt at bedside to provide list of SNF bed offers. Pt appeared to be somnolent but did awake to CSW voice. Pt expressed his significant other would be coming to the hospital today. CSW called pt significant other [name] to inform her list for SNF bed offers has been left on pt chair. [name] stated they would review list and then make decision.   TOC will continue to follow.    Expected Discharge Plan: Skilled Nursing Facility Barriers to Discharge: Continued Medical Work up  Expected Discharge Plan and Services In-house Referral: Clinical Social Work   Post Acute Care Choice: Dialysis Living arrangements for the past 2 months: Single Family Home                                       Social Determinants of Health (SDOH) Interventions SDOH Screenings   Food Insecurity: No Food Insecurity (04/22/2023)  Housing: Patient Declined (04/08/2023)  Transportation Needs: No Transportation Needs (04/07/2023)  Utilities: Not At Risk (04/25/2023)  Financial Resource Strain: Low Risk  (05/30/2018)  Physical Activity: Inactive (05/30/2018)  Social Connections: Socially Integrated (05/30/2018)  Stress: No Stress Concern Present (05/30/2018)  Tobacco Use: High Risk (04/07/2023)    Readmission Risk Interventions     No data to display         Oletta Lamas, MSW, LCSWA, LCASA Transitions of Care  Clinical Social Worker I

## 2023-04-17 NOTE — Progress Notes (Signed)
Rounding Note    Patient Name: Peter [name] Date of Encounter: 04/17/2023  [facility] Cardiologist: Rollene Rotunda, MD   Subjective  PT sleepy but wakens   Groans a bit with moving arms/ legs   Inpatient Medications    Scheduled Meds:  (feeding supplement) PROSource Plus  30 mL Oral TID BM   sodium chloride   Intravenous Once   amiodarone  200 mg Oral BID   apixaban  5 mg Oral BID   atorvastatin  10 mg Oral QHS   Chlorhexidine Gluconate Cloth  6 each Topical Q0600   [START ON 04/23/2023] darbepoetin (ARANESP) injection - DIALYSIS  100 mcg Subcutaneous Q Wed-1800   feeding supplement  237 mL Oral TID BM   ferric citrate  630 mg Oral TID WC   insulin aspart  0-9 Units Subcutaneous Q4H   [START ON 04/18/2023] midodrine  5 mg Oral Q M,W,F-HD   multivitamin  1 tablet Oral QHS   pantoprazole (PROTONIX) IV  40 mg Intravenous Q12H   Continuous Infusions:  sodium chloride Stopped (04/11/23 0946)   albumin human 60 mL/hr at 04/17/23 1100    ceFAZolin (ANCEF) IV Stopped (04/16/23 1743)   PRN Meds: Place/Maintain arterial line **AND** sodium chloride, acetaminophen, albumin human, lidocaine (PF), mouth rinse, oxyCODONE   Vital Signs    Vitals:   04/17/23 0402 04/17/23 0813 04/17/23 1009 04/17/23 1011  BP: (!) 105/51 (!) 106/58  (!) 112/54  Pulse: 89 70 93 93  Resp: 20     Temp: 99.2 F (37.3 C) (!) 101.6 F (38.7 C) (!) 100.7 F (38.2 C)   TempSrc: Oral Oral Oral   SpO2: 98% 97% 94% 93%  Weight:      Height:        Intake/Output Summary (Last 24 hours) at 04/17/2023 1144 Last data filed at 04/17/2023 1100 Gross per 24 hour  Intake 887.2 ml  Output 2000 ml  Net -1112.8 ml      04/17/2023    4:00 AM 04/16/2023    5:48 PM 04/16/2023    1:18 PM  Last 3 Weights  Weight (lbs) 255 lb 15.3 oz 255 lb 4.7 oz 259 lb 11.2 oz  Weight (kg) 116.1 kg 115.8 kg 117.8 kg      Telemetry    Atrial fibrillation 60s to 80s - Personally Reviewed  ECG    No new  -  Personally Reviewed  Physical Exam   GEN  Pt resting   Groans with movement   Neck: Neck is full  Cardiac: Irregularly irregular.   3/6 systolic ejection murmur. Respiratory: Rhonchi in upper airways   GI: Soft, nontender, non-distended  MS: 1-2+ UE edema    Legs are wrapped  Labs    High Sensitivity Troponin:   Recent Labs  Lab 04/08/23 1915 04/08/23 2210 04/09/23 0152 04/09/23 0455  TROPONINIHS 127* 121* 137* 156*     Chemistry Recent Labs  Lab 04/11/23 0314 04/11/23 0420 04/12/23 0400 04/13/23 0431 04/14/23 0357 04/15/23 0131 04/16/23 0442 04/16/23 1151 04/17/23 0445  NA  --    < > 128* 126*  --   --   --  131*  --   K  --    < > 3.6 4.1  --   --   --  4.6  --   CL  --    < > 92* 92*  --   --   --  90*  --   CO2  --    < >  24 23  --   --   --  27  --   GLUCOSE  --    < > 116* 91  --   --   --  133*  --   BUN  --    < > 29* 45*  --   --   --  40*  --   CREATININE  --    < > 4.29* 5.50*  --   --   --  4.83*  --   CALCIUM  --    < > 7.2* 7.8*  --   --   --  9.3  --   MG 2.2  --  1.8  --   --   --   --   --  1.8  PROT 6.4*  --   --  5.9*   < > 6.5 6.8  --  7.0  ALBUMIN <1.5*   < > <1.5* <1.5*  <1.5*   < > 1.7* 1.7* 1.6* 2.1*  AST 42*  --   --  37   < > 36 36  --  36  ALT 15  --   --  8   < > 8 7  --  6  ALKPHOS 81  --   --  78   < > 99 95  --  78  BILITOT 9.1*  --   --  4.8*   < > 3.1* 2.7*  --  2.5*  GFRNONAA  --    < > 14* 10*  --   --   --  12*  --   ANIONGAP  --    < > 12 11  --   --   --  14  --    < > = values in this interval not displayed.    Lipids No results for input(s): "CHOL", "TRIG", "HDL", "LABVLDL", "LDLCALC", "CHOLHDL" in the last 168 hours.  Hematology Recent Labs  Lab 04/15/23 0210 04/16/23 0442 04/17/23 0445  WBC 11.2* 6.7 5.4  RBC 2.22* 2.08* 1.97*  HGB 7.3* 7.0* 6.5*  HCT 20.6* 20.1* 19.3*  MCV 92.8 96.6 98.0  MCH 32.9 33.7 33.0  MCHC 35.4 34.8 33.7  RDW 16.7* 17.6* 18.8*  PLT 121* 137* 141*   Thyroid  No results for  input(s): "TSH", "FREET4" in the last 168 hours.   BNP No results for input(s): "BNP", "PROBNP" in the last 168 hours.   DDimer  Recent Labs  Lab 04/12/23 1345  DDIMER 9.28*      Cardiac Studies   TEE  04/10/23   1. Left ventricular ejection fraction, by estimation, is 60 to 65%. The  left ventricle has normal function. The left ventricle has no regional  wall motion abnormalities.   2. Right ventricular systolic function is normal. The right ventricular  size is normal.   3. Abnormal Thickened MV leaflets with moderate mitral annular  calcification. There appears to be a flail P2 scallop and very eccentric  MR directed anteriorly. The MR is at least moderate to severe but no flow  into the left upper PV. The other pulmonary   veins could not be identified. The MR jet wraps around the left atrium..  The mitral valve is abnormal. Moderate to severe mitral valve  regurgitation. No evidence of mitral stenosis. Moderate mitral annular  calcification.   4. The aortic valve is tricuspid. There is mild calcification of the  aortic valve. There is mild thickening of the aortic valve.  There is  moderate aortic valve annular calcification.      Aortic valve regurgitation is not visualized. Mild aortic valve  stenosis.   5. The inferior vena cava is normal in size with greater than 50%  respiratory variability, suggesting right atrial pressure of 3 mmHg.   6. No left atrial/left atrial appendage thrombus was detected.   7. Aortic dilatation noted. There is mild dilatation of the aortic root,  measuring 38 mm. There is mild (Grade II) layered plaque.   Conclusion(s)/Recommendation(s): Normal biventricular function with  moderate to severe mitral regurgitation due to flail P2 segment of  posterior MV leaflet. The leaflet is thickened and cannot discern whether  this represents myxomatous degeneration or  vegetation.   TTE  04/03/23   1. Left ventricular ejection fraction, by  estimation, is 60 to 65%. The  left ventricle has normal function. The left ventricle has no regional  wall motion abnormalities. There is severe left ventricular hypertrophy.  Left ventricular diastolic parameters   are indeterminate.   2. Right ventricular systolic function is normal. The right ventricular  size is normal.   3. Left atrial size was moderately dilated.   4. Mild appearing MR but some splay artifact cannot r/o some posterior  leaflet prolapse in addition to MAC with anteriorly directed MR Given  bacteremia and abnormal posterior mitral annulus suggest TEE to further  assess if clnically indicated. The  mitral valve is degenerative. Mild mitral valve regurgitation. No evidence  of mitral stenosis. Moderate mitral annular calcification.   5. The aortic valve is tricuspid. There is moderate calcification of the  aortic valve. There is moderate thickening of the aortic valve. Aortic  valve regurgitation is not visualized. Mild aortic valve stenosis.   6. The inferior vena cava is dilated in size with >50% respiratory  variability, suggesting right atrial pressure of 8 mmHg.    Assessment & Plan    1.  Severe mitral regurgitation due to flail segment of posterior mitral leaflet  At present he is still not a candidate for intervention.   I have followed peripherally with dialysis.    Continues with antibiotics   2  HFpEF   Volume remains overloaded   Continues with dialysis   3  Atrial fib  Remains rate controlled on amiodarone po and Eliquis  4  ID Pt being treated for MSSA bacteremia, sepsis On ABX   4  Hypotension  Pt on midodrine 5 mg on days of dialysis      Will continue to follow peripherally as improves    For questions or updates, please contact [facility] Please consult [email or web address] for contact info under        Signed, Dietrich Pates, MD  04/17/2023, 11:44 AM

## 2023-04-17 NOTE — Progress Notes (Signed)
   04/17/23 0500  Provider Notification  Provider Name/Title Dr. Pierre Bali  Date Provider Notified 04/17/23  Time Provider Notified [phone]  Notification Reason Critical Result  Test performed and critical result Hgb 6.5 (critical lab result hemoglobin = 6.5 has been downtrending and was 7.0 yesterday. no signs of bleeding. is on dialysis. here for MSSA bacteremia and multiple septic joints)  Date Critical Result Received 04/17/23  Time Critical Result Received 0508  Provider response No new orders (per MD defer to nephrology)  Date of Provider Response 04/17/23  Time of Provider Response [phone]

## 2023-04-17 NOTE — Progress Notes (Signed)
PROGRESS NOTE  Peter [name] YQM:[id number] DOB: April 23, 1951 DOA: 04/17/2023 PCP: [name], MD  HPI/Recap of past 30 hours: 72 year old gentleman with history of ESRD on hemodialysis MWF through right upper extremity AV fistula, chronic A-fib on Eliquis, type 2 diabetes, anemia of chronic disease, hypertension, hyperlipidemia, obstructive sleep apnea not on CPAP initially presented from home with weakness, not feeling well, nausea and vomiting and also sustained fall onto the right knee and left shoulder. 7/2, admitted to medical floor for suspected sepsis, fever 102.3.  Blood cultures positive for MSSA bacteremia with unclear source. 7/3, hemodialysis, right knee joint aspiration Overnight hypotension and more somnolent.  Transferred to ICU.  On vasopressors and midodrine. 7/8, I&D of the right knee and bilateral shoulders 7/11, TEE with suspected endocarditis.  Repeat blood cultures negative.  MRI brain negative. 7/16-7/17, transferred to medical floor after being off vasopressors. Followed by nephrology and ID.  CT chest with contrast was completed on 04/16/2023.  04/17/2023: The patient was seen and examined at his bedside.  He is somnolent but arousable to voices and denies having any pain or any other complaints.  Hemoglobin low this morning 6.5, 2 units PRBCs ordered to be transfused.  BP also soft with MAP of 60.  Stat midodrine 10 mg ordered to maintain MAP greater than 65.  Due to hypersomnolence VBG ordered to rule out hypercarbia as a cause of his somnolence.  VBG is pending.  Assessment/Plan: Principal Problem:   MSSA bacteremia Active Problems:   Obstructive sleep apnea   Essential hypertension, benign   MVP (mitral valve prolapse)   ESRD on dialysis (HCC)   Anemia in chronic kidney disease   PAF (paroxysmal atrial fibrillation) (HCC)   Aspiration pneumonia (HCC)   Staphylococcal arthritis of right knee (HCC)   Staphylococcal arthritis of right wrist (HCC)    Staphylococcal arthritis of shoulder (HCC)   Staphylococcal arthritis of right shoulder (HCC)   Sepsis (HCC)   Mitral regurgitation due to acquired abnormality of mitral subvalvular apparatus  Severe sepsis secondary to MSSA bacteremia, 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  Status post washout of shoulder and knee joint.  Local wound care to continue. Blood culture 7/2, MSSA.  Wound culture 7/3 MSSA All wound cultures with rare Staph aureus. Blood cultures times x 2 on 04/17/23, follow Followed by ID.  Currently on cefazolin.  ID recommends cefazolin 2 g IV with hemodialysis for 8 weeks since last debridement of 7/8. Suspected endocarditis due to new severe mitral regurgitation, currently not a surgical candidate because of being very frail and debilitated.  Treating conservatively. Maintain MAP greater than 65, BPs are currently soft P.o. midodrine 10 mg x 1 given on 04/17/2023 for MAP of 60.  Bilateral pleural effusions, seen on CT scan done on 04/16/2023 Volume status managed with hemodialysis Mobilize as tolerated Incentive spirometer Currently requiring 2 L to maintain his O2 saturation above 92%   ESRD on hemodialysis on HD MWF:  Anemia of chronic disease.  Getting hemodialysis as per nephrology.   Volume status managed with hemodialysis.    Paroxysmal A-fib:  Currently on amiodarone and Eliquis.   Acute blood loss anemia Hemoglobin dropped this morning 6.5 from 7.0 No reported overt bleeding Will get FOBT 2 units PRBCs ordered to be transfused  Physical debility PT OT assessment Fall precautions.  Goals of care Palliative care medicine consulted to assist with establishing goals of care. Currently  full code.   DVT prophylaxis: Place and maintain sequential compression device Start: 04/02/23 1116 apixaban (ELIQUIS) tablet 5 mg      Time: 50  minutes.    Code Status: Full code  Family Communication: None at bedside.  Disposition Plan: Likely will discharge to home once infectious disease and surgery sign off   Consultants: Infectious disease Nephrology Orthopedic surgery.  Procedures: I&D Hemodialysis  Antimicrobials: Cefazolin  DVT prophylaxis: Eliquis  Status is: Inpatient The patient requires at least 2 midnights for further evaluation and treatment of present condition.    Objective: Vitals:   04/17/23 0402 04/17/23 0813 04/17/23 1009 04/17/23 1011  BP: (!) 105/51 (!) 106/58  (!) 112/54  Pulse: 89 70 93 93  Resp: 20     Temp: 99.2 F (37.3 C) (!) 101.6 F (38.7 C) (!) 100.7 F (38.2 C)   TempSrc: Oral Oral Oral   SpO2: 98% 97% 94% 93%  Weight:      Height:        Intake/Output Summary (Last 24 hours) at 04/17/2023 1159 Last data filed at 04/17/2023 1100 Gross per 24 hour  Intake 887.2 ml  Output 2000 ml  Net -1112.8 ml   Filed Weights   04/16/23 1318 04/16/23 1748 04/17/23 0400  Weight: 117.8 kg 115.8 kg 116.1 kg    Exam:  General: 72 y.o. year-old male well developed well nourished in no acute distress.  Somnolent but arousable to voices. Cardiovascular: Irregular rate and rhythm with no rubs or gallops.  No thyromegaly or JVD noted.   Respiratory: Mild rales at bases.  No wheezing noted.  Poor inspiratory effort. Abdomen: Soft nontender nondistended with normal bowel sounds x4 quadrants. Musculoskeletal: No lower extremity edema. 2/4 pulses in all 4 extremities. Skin: No ulcerative lesions noted or rashes, Psychiatry: Unable to assess mood due to somnolence.   Data Reviewed: CBC: Recent Labs  Lab 04/13/23 0431 04/14/23 1319 04/15/23 0210 04/16/23 0442 04/17/23 0445  WBC 14.7* 11.6* 11.2* 6.7 5.4  HGB 7.8* 7.5* 7.3* 7.0* 6.5*  HCT 22.0* 20.9* 20.6* 20.1* 19.3*  MCV 91.7 94.1 92.8 96.6 98.0  PLT 100* 114* 121* 137* 141*   Basic Metabolic Panel: Recent Labs  Lab  04/10/23 1556 04/11/23 0314 04/11/23 0420 04/12/23 0400 04/13/23 0431 04/16/23 1151 04/17/23 0445  NA 130*  --  128* 128* 126* 131*  --   K 3.7  --  3.7 3.6 4.1 4.6  --   CL 92*  --  94* 92* 92* 90*  --   CO2 21*  --  20* 24 23 27   --   GLUCOSE 164*  --  106* 116* 91 133*  --   BUN 42*  --  48* 29* 45* 40*  --   CREATININE 5.04*  --  5.77* 4.29* 5.50* 4.83*  --   CALCIUM 6.8*  --  6.6* 7.2* 7.8* 9.3  --   MG  --  2.2  --  1.8  --   --  1.8  PHOS  --   --  6.2* 4.2 5.6* 4.3  --    GFR: Estimated Creatinine Clearance: 16.1 mL/min (A) (by C-G formula based on SCr of 4.83 mg/dL (H)). Liver Function Tests: Recent Labs  Lab 04/13/23 0431 04/14/23 0357 04/15/23 0131 04/16/23 0442 04/16/23 1151 04/17/23 0445  AST 37 32 36 36  --  36  ALT 8 7 8 7   --  6  ALKPHOS 78 77 99 95  --  78  BILITOT  4.8* 3.5* 3.1* 2.7*  --  2.5*  PROT 5.9* 6.6 6.5 6.8  --  7.0  ALBUMIN <1.5*  <1.5* 1.7* 1.7* 1.7* 1.6* 2.1*   No results for input(s): "LIPASE", "AMYLASE" in the last 168 hours. Recent Labs  Lab 04/11/23 0823  AMMONIA 31   Coagulation Profile: Recent Labs  Lab 04/11/23 0314 04/12/23 1345  INR 1.8* 1.6*   Cardiac Enzymes: No results for input(s): "CKTOTAL", "CKMB", "CKMBINDEX", "TROPONINI" in the last 168 hours. BNP (last 3 results) No results for input(s): "PROBNP" in the last 8760 hours. HbA1C: No results for input(s): "HGBA1C" in the last 72 hours. CBG: Recent Labs  Lab 04/16/23 1837 04/16/23 2019 04/17/23 0006 04/17/23 0354 04/17/23 0811  GLUCAP 98 130* 120* 147* 146*   Lipid Profile: No results for input(s): "CHOL", "HDL", "LDLCALC", "TRIG", "CHOLHDL", "LDLDIRECT" in the last 72 hours. Thyroid Function Tests: No results for input(s): "TSH", "T4TOTAL", "FREET4", "T3FREE", "THYROIDAB" in the last 72 hours. Anemia Panel: Recent Labs    04/14/23 1319  FERRITIN 3,618*  TIBC NOT CALCULATED  IRON 28*   Urine analysis:    Component Value Date/Time   COLORURINE  STRAW (A) 05/29/2018 2016   APPEARANCEUR CLEAR 05/29/2018 2016   LABSPEC 1.009 05/29/2018 2016   PHURINE 5.0 05/29/2018 2016   GLUCOSEU 50 (A) 05/29/2018 2016   HGBUR NEGATIVE 05/29/2018 2016   BILIRUBINUR NEGATIVE 05/29/2018 2016   KETONESUR NEGATIVE 05/29/2018 2016   PROTEINUR 30 (A) 05/29/2018 2016   UROBILINOGEN 0.2 03/23/2008 1424   NITRITE NEGATIVE 05/29/2018 2016   LEUKOCYTESUR NEGATIVE 05/29/2018 2016   Sepsis Labs: @LABRCNTIP (procalcitonin:4,lacticidven:4)  )No results found for this or any previous visit (from the past 240 hour(s)).    Studies: No results found.  Scheduled Meds:  (feeding supplement) PROSource Plus  30 mL Oral TID BM   sodium chloride   Intravenous Once   amiodarone  200 mg Oral BID   apixaban  5 mg Oral BID   atorvastatin  10 mg Oral QHS   Chlorhexidine Gluconate Cloth  6 each Topical Q0600   [START ON 04/23/2023] darbepoetin (ARANESP) injection - DIALYSIS  100 mcg Subcutaneous Q Wed-1800   feeding supplement  237 mL Oral TID BM   ferric citrate  630 mg Oral TID WC   insulin aspart  0-9 Units Subcutaneous Q4H   [START ON 04/18/2023] midodrine  5 mg Oral Q M,W,F-HD   multivitamin  1 tablet Oral QHS   pantoprazole (PROTONIX) IV  40 mg Intravenous Q12H    Continuous Infusions:  sodium chloride Stopped (04/11/23 0946)   albumin human 60 mL/hr at 04/17/23 1100    ceFAZolin (ANCEF) IV Stopped (04/16/23 1743)     LOS: 16 days     Darlin Drop, MD [facility] Pager [phone]  If 7PM-7AM, please contact night-coverage [email or web address] Password [facility] 04/17/2023, 11:59 AM

## 2023-04-17 NOTE — Progress Notes (Signed)
[name] Progress Note    Assessment/ Plan:   Sepsis/ aspiration pneumonia / MSSA bacteremia: 1st set Blood Cx 7/2 positive w/ aspirate from knee also + for staph aureus.  AVG without signs of infection. SP washout/ I&D of bilat shoulder and R knee 7/08 per ortho.  ID following, was on nafcillin, now IV ancef. C/f left SE joint shoulder septic arthritis Hypotension - resolved, now off midodrine. Will r/s midodrine 5mg  PRN to be given 30 min prior to HD Hypokalemia - w/ NSVT. Latest K+ 4.16 ESRD: HD is MWF. HD tomorrow Volume: vol overload.  UF as tolerated Anemia of CKD: fe panel ordered, rec'd ESA 7/15. Avoiding IV Fe in the context of bacteremia. 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 SP IV Ca. Phos acceptable, cont binders.  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 Pt is not candidate for repair/ replacement at this time per cardiology.  Elevated LFTs - tbili peaked at 12 and trended down A-Fib - on amio, eliquis  Discussed with primary service.  Outpatient Dialysis Orders:  MWF [name]hr  450/800     97.5kg    3K/2Ca   AVG   15g   Heparin 10,000 bolus - no ESA, Hgb > 12 - No VDRA (recently stopped d/t Ca 10.9)  Peter Sar, MD [facility]   Subjective:   Patient seen and examined bedside. No complaints. Tolerated HD yesterday with net uf 2L however BP was soft and needed albumin Hgb 6.5 today   Objective:   BP (!) 106/58   Pulse 70   Temp (!) 101.6 F (38.7 C) (Oral)   Resp 20   Ht 5\' 6"  (1.676 m)   Wt 116.1 kg   SpO2 97%   BMI 41.31 kg/m   Intake/Output Summary (Last 24 hours) at 04/17/2023 0850 Last data filed at 04/17/2023 0000 Gross per 24 hour  Intake 594 ml  Output 2000 ml  Net -1406 ml   Weight change: 0.4 kg  Physical Exam: Gen: Ill appearing, NAD CVS: Irregularly irregular Resp: CTA bl Abd: Soft Ext: left>right UE swelling, trace edema b/l LEs Neuro:  awake, alert Dialysis access: R AVG +b/t  Imaging: CT CHEST W CONTRAST  Result Date: 04/16/2023 CLINICAL DATA:  Osteomyelitis suspected, chest. Concern for left septic sternoclavicular arthritis. End-stage renal disease. EXAM: CT CHEST WITH CONTRAST TECHNIQUE: Multidetector CT imaging of the chest was performed during intravenous contrast administration. RADIATION DOSE REDUCTION: This exam was performed according to the departmental dose-optimization program which includes automated exposure control, adjustment of the mA and/or kV according to patient size and/or use of iterative reconstruction technique. CONTRAST:  50mL OMNIPAQUE IOHEXOL 350 MG/ML SOLN COMPARISON:  No prior chest CT. Chest radiographs 04/07/2023, MRI of the right shoulder 04/05/2023, MRI of the left shoulder 04/03/2023 and CT cervical spine 25-Apr-2023. FINDINGS: Cardiovascular: Left IJ central venous catheter projects to the level of the confluence of the brachiocephalic veins. No acute vascular findings are demonstrated. There is atherosclerosis of the aorta, great vessels and coronary arteries. There are calcifications of the aortic valve. The heart is mildly enlarged. No significant pericardial effusion. Mediastinum/Nodes: There are no enlarged mediastinal, hilar or axillary lymph nodes.There are scattered small normal-sized mediastinal lymph nodes. The thyroid gland, trachea and esophagus demonstrate no significant findings. Lungs/Pleura: Images through the lung bases are degraded by beam hardening artifact. There are small bilateral pleural effusions with dependent airspace opacities in both lungs. The  left lower lobe is completely opacified with associated volume loss. No suspicious nodules within the aerated portions of the lungs. Upper abdomen: No acute or suspicious findings are seen within the visualized upper abdomen. Musculoskeletal/Chest wall: Asymmetric thickening of the left sternoclavicular joint capsule which was incompletely  visualized on previous cervical spine CT, but appears mildly progressive. Mild surrounding soft tissue stranding which could reflect inflammation. No focal fluid collection, foreign body or soft tissue emphysema. Specifically, no retrosternal fluid collection. There is no evidence of erosive disease, osteomyelitis, acute fracture or dislocation. Previously demonstrated shoulder joint effusions are incompletely visualized, but persistent. Probable periarticular inflammatory changes on the right, incompletely visualized. Multilevel thoracic spondylosis with endplate osteophytes. Mild generalized chest wall edema with mild bilateral gynecomastia. Unless specific follow-up recommendations are mentioned in the findings or impression sections, no imaging follow-up of any mentioned incidental findings is recommended. IMPRESSION: 1. Asymmetric thickening of the left sternoclavicular joint capsule with mild surrounding soft tissue stranding, suspicious for septic arthritis. No evidence of erosive disease, osteomyelitis or abscess. 2. Previously demonstrated bilateral shoulder joint effusions are incompletely visualized, but persistent. Probable periarticular inflammatory changes on the right, incompletely visualized. 3. Small bilateral pleural effusions with dependent airspace opacities in both lungs, likely atelectasis. The left lower lobe is completely opacified with associated volume loss and aspiration or pneumonia cannot be excluded. Radiographic follow-up recommended. 4. Mild generalized chest wall edema with mild bilateral gynecomastia. 5.  Aortic Atherosclerosis (ICD10-I70.0). Electronically Signed   By: Carey Bullocks M.D.   On: 04/16/2023 14:57    Labs: BMET Recent Labs  Lab 04/10/23 1556 04/11/23 0420 04/12/23 0400 04/13/23 0431 04/16/23 1151  NA 130* 128* 128* 126* 131*  K 3.7 3.7 3.6 4.1 4.6  CL 92* 94* 92* 92* 90*  CO2 21* 20* 24 23 27   GLUCOSE 164* 106* 116* 91 133*  BUN 42* 48* 29* 45* 40*   CREATININE 5.04* 5.77* 4.29* 5.50* 4.83*  CALCIUM 6.8* 6.6* 7.2* 7.8* 9.3  PHOS  --  6.2* 4.2 5.6* 4.3   CBC Recent Labs  Lab 04/14/23 1319 04/15/23 0210 04/16/23 0442 04/17/23 0445  WBC 11.6* 11.2* 6.7 5.4  HGB 7.5* 7.3* 7.0* 6.5*  HCT 20.9* 20.6* 20.1* 19.3*  MCV 94.1 92.8 96.6 98.0  PLT 114* 121* 137* 141*    Medications:     (feeding supplement) PROSource Plus  30 mL Oral TID BM   sodium chloride   Intravenous Once   amiodarone  200 mg Oral BID   apixaban  5 mg Oral BID   atorvastatin  10 mg Oral QHS   Chlorhexidine Gluconate Cloth  6 each Topical Q0600   [START ON 04/23/2023] darbepoetin (ARANESP) injection - DIALYSIS  100 mcg Subcutaneous Q Wed-1800   feeding supplement  237 mL Oral TID BM   ferric citrate  630 mg Oral TID WC   insulin aspart  0-9 Units Subcutaneous Q4H   multivitamin  1 tablet Oral QHS   pantoprazole (PROTONIX) IV  40 mg Intravenous Q12H      Peter Sar, MD [facility] 04/17/2023, 8:50 AM

## 2023-04-17 NOTE — Progress Notes (Signed)
PT Cancellation Note  Patient Details Name: Peter [name] MRN: [id number] DOB: 1951/08/05   Cancelled Treatment:    Reason Eval/Treat Not Completed: Medical issues which prohibited therapy - pt somnolent with RR this am, now receiving blood and reports severe diffuse pain. PT to check back tomorrow per pt/family request, pt's wife states she will perform ROM.   Peter [name], PT DPT Acute Rehabilitation Services Secure Chat Preferred  Office [phone]    Peter [name] 04/17/2023, 3:12 PM

## 2023-04-18 ENCOUNTER — Inpatient Hospital Stay (HOSPITAL_COMMUNITY): Payer: Medicare Other

## 2023-04-18 DIAGNOSIS — Z7189 Other specified counseling: Secondary | ICD-10-CM | POA: Diagnosis not present

## 2023-04-18 DIAGNOSIS — B9561 Methicillin susceptible Staphylococcus aureus infection as the cause of diseases classified elsewhere: Secondary | ICD-10-CM | POA: Diagnosis not present

## 2023-04-18 DIAGNOSIS — R7881 Bacteremia: Secondary | ICD-10-CM | POA: Diagnosis not present

## 2023-04-18 DIAGNOSIS — Z515 Encounter for palliative care: Secondary | ICD-10-CM | POA: Diagnosis not present

## 2023-04-18 LAB — BPAM RBC
Blood Product Expiration Date: 202408192359
Blood Product Expiration Date: 202408202359
ISSUE DATE / TIME: 202407181215
ISSUE DATE / TIME: 202407181553
Unit Type and Rh: 5100
Unit Type and Rh: 5100

## 2023-04-18 LAB — TYPE AND SCREEN
ABO/RH(D): O POS
Antibody Screen: NEGATIVE
Unit division: 0
Unit division: 0

## 2023-04-18 LAB — COMPREHENSIVE METABOLIC PANEL
ALT: 8 U/L (ref 0–44)
AST: 52 U/L — ABNORMAL HIGH (ref 15–41)
Albumin: 1.8 g/dL — ABNORMAL LOW (ref 3.5–5.0)
Alkaline Phosphatase: 69 U/L (ref 38–126)
Anion gap: 14 (ref 5–15)
BUN: 50 mg/dL — ABNORMAL HIGH (ref 8–23)
CO2: 25 mmol/L (ref 22–32)
Calcium: 10.1 mg/dL (ref 8.9–10.3)
Chloride: 90 mmol/L — ABNORMAL LOW (ref 98–111)
Creatinine, Ser: 5.33 mg/dL — ABNORMAL HIGH (ref 0.61–1.24)
GFR, Estimated: 11 mL/min — ABNORMAL LOW (ref >60)
Glucose, Bld: 99 mg/dL (ref 70–99)
Potassium: 4.9 mmol/L (ref 3.5–5.1)
Sodium: 129 mmol/L — ABNORMAL LOW (ref 135–145)
Total Bilirubin: 2.4 mg/dL — ABNORMAL HIGH (ref 0.3–1.2)
Total Protein: 7.5 g/dL (ref 6.5–8.1)

## 2023-04-18 LAB — BLOOD GAS, ARTERIAL
Acid-Base Excess: 6.8 mmol/L — ABNORMAL HIGH (ref 0.0–2.0)
Bicarbonate: 30.3 mmol/L — ABNORMAL HIGH (ref 20.0–28.0)
Drawn by: 24610
O2 Saturation: 96.9 %
Patient temperature: 37
pCO2 arterial: 38 mmHg (ref 32–48)
pH, Arterial: 7.51 — ABNORMAL HIGH (ref 7.35–7.45)
pO2, Arterial: 74 mmHg — ABNORMAL LOW (ref 83–108)

## 2023-04-18 LAB — CBC
HCT: 25.7 % — ABNORMAL LOW (ref 39.0–52.0)
Hemoglobin: 8.4 g/dL — ABNORMAL LOW (ref 13.0–17.0)
MCH: 31.6 pg (ref 26.0–34.0)
MCHC: 32.7 g/dL (ref 30.0–36.0)
MCV: 96.6 fL (ref 80.0–100.0)
Platelets: 174 10*3/uL (ref 150–400)
RBC: 2.66 MIL/uL — ABNORMAL LOW (ref 4.22–5.81)
RDW: 21.4 % — ABNORMAL HIGH (ref 11.5–15.5)
WBC: 7.7 10*3/uL (ref 4.0–10.5)
nRBC: 0 % (ref 0.0–0.2)

## 2023-04-18 LAB — CULTURE, BLOOD (ROUTINE X 2): Culture: NO GROWTH

## 2023-04-18 LAB — GLUCOSE, CAPILLARY
Glucose-Capillary: 131 mg/dL — ABNORMAL HIGH (ref 70–99)
Glucose-Capillary: 87 mg/dL (ref 70–99)
Glucose-Capillary: 96 mg/dL (ref 70–99)

## 2023-04-18 LAB — PHOSPHORUS: Phosphorus: 5.3 mg/dL — ABNORMAL HIGH (ref 2.5–4.6)

## 2023-04-18 LAB — MAGNESIUM: Magnesium: 2 mg/dL (ref 1.7–2.4)

## 2023-04-18 MED ORDER — DEXMEDETOMIDINE HCL IN NACL 400 MCG/100ML IV SOLN
INTRAVENOUS | Status: AC
  Filled 2023-04-18: qty 100

## 2023-04-18 MED ORDER — PIPERACILLIN-TAZOBACTAM IN DEX 2-0.25 GM/50ML IV SOLN
2.2500 g | Freq: Three times a day (TID) | INTRAVENOUS | Status: DC
  Filled 2023-04-18 (×2): qty 50

## 2023-04-18 MED ORDER — ACETAMINOPHEN 325 MG PO TABS: 650.0000 mg | ORAL_TABLET | Freq: Four times a day (QID) | ORAL | Status: DC | PRN

## 2023-04-18 MED ORDER — HYDROMORPHONE HCL 1 MG/ML IJ SOLN
0.5000 mg | INTRAMUSCULAR | Status: DC | PRN
  Administered 2023-04-18: 0.5 mg via INTRAVENOUS

## 2023-04-18 MED ORDER — GLYCOPYRROLATE 0.2 MG/ML IJ SOLN
0.2000 mg | INTRAMUSCULAR | Status: DC | PRN
  Administered 2023-04-19: 0.2 mg via INTRAVENOUS
  Filled 2023-04-18: qty 1

## 2023-04-18 MED ORDER — SODIUM CHLORIDE 0.9 % IV SOLN: INTRAVENOUS | Status: DC

## 2023-04-18 MED ORDER — HYDROMORPHONE HCL 1 MG/ML IJ SOLN
INTRAMUSCULAR | Status: AC
  Filled 2023-04-18: qty 0.5

## 2023-04-18 MED ORDER — MORPHINE 100MG IN NS 100ML (1MG/ML) PREMIX INFUSION
0.0000 mg/h | INTRAVENOUS | Status: DC
  Administered 2023-04-18 – 2023-04-19 (×2): 5 mg/h via INTRAVENOUS
  Filled 2023-04-18: qty 100

## 2023-04-18 MED ORDER — SODIUM CHLORIDE 0.9 % IV SOLN
1.0000 g | Freq: Once | INTRAVENOUS | Status: DC
  Filled 2023-04-18: qty 10

## 2023-04-18 MED ORDER — GLYCOPYRROLATE 1 MG PO TABS: 1.0000 mg | ORAL_TABLET | ORAL | Status: DC | PRN

## 2023-04-18 MED ORDER — MORPHINE BOLUS VIA INFUSION: 5.0000 mg | INTRAVENOUS | Status: DC | PRN

## 2023-04-18 MED ORDER — MORPHINE 100MG IN NS 100ML (1MG/ML) PREMIX INFUSION
INTRAVENOUS | Status: AC
  Filled 2023-04-18: qty 100

## 2023-04-18 MED ORDER — ACETAMINOPHEN 650 MG RE SUPP: 650.0000 mg | Freq: Four times a day (QID) | RECTAL | Status: DC | PRN

## 2023-04-18 MED ORDER — ALTEPLASE 2 MG IJ SOLR
INTRAMUSCULAR | Status: AC
  Filled 2023-04-18: qty 2

## 2023-04-18 MED ORDER — ALTEPLASE 2 MG IJ SOLR
2.0000 mg | Freq: Once | INTRAMUSCULAR | Status: AC
  Administered 2023-04-18: 2 mg

## 2023-04-18 MED ORDER — VANCOMYCIN HCL 1500 MG/300ML IV SOLN
1500.0000 mg | Freq: Once | INTRAVENOUS | Status: DC
  Filled 2023-04-18: qty 300

## 2023-04-18 MED ORDER — ALTEPLASE 2 MG IJ SOLR: 2.0000 mg | Freq: Once | INTRAMUSCULAR | Status: DC

## 2023-04-18 MED ORDER — POLYVINYL ALCOHOL 1.4 % OP SOLN: 1.0000 [drp] | Freq: Four times a day (QID) | OPHTHALMIC | Status: DC | PRN

## 2023-04-18 MED ORDER — GLYCOPYRROLATE 0.2 MG/ML IJ SOLN: 0.2000 mg | INTRAMUSCULAR | Status: DC | PRN

## 2023-04-18 MED ORDER — DEXMEDETOMIDINE HCL IN NACL 400 MCG/100ML IV SOLN
0.0000 ug/kg/h | INTRAVENOUS | Status: DC
  Administered 2023-04-18: 0.4 ug/kg/h via INTRAVENOUS
  Filled 2023-04-18: qty 100

## 2023-04-18 NOTE — IPAL (Signed)
  Interdisciplinary Goals of Care Family Meeting   Date carried out: 04/18/2023  Location of the meeting: Bedside  Member's involved: Physician, Bedside Registered Nurse, and Family Member or next of kin  Durable Power of Attorney or acting medical decision maker: Brother, daughter,   girlfriend  Discussion: We discussed goals of care for Peter [name] .    Discussed rocky hospital course to date and what patient would have wanted.  After discussing quality vs. Quantity of life all in agreement Peter [name] would want to pass peacefully given present circumstances.  Code status:   Code Status: DNR   Disposition: In-patient comfort care  Time spent for the meeting: 12 mins    Lorin Glass, MD  04/18/2023, 1:52 PM

## 2023-04-18 NOTE — Progress Notes (Signed)
Pt's s/o contact updated via phone. CVC not returning blood, IV team @bedside  to assess, provider updated & will move to lab draw. Provider contacted overnight re: itching. See emar.

## 2023-04-18 NOTE — Progress Notes (Signed)
   Reviewed events of today.  Not plans to stop dialysis, pursue comfort care.  Appropriate given magnitude of medical problems Will sign off   Please call with questions.   Signed, Dietrich Pates, MD  04/18/2023, 3:34 PM

## 2023-04-18 NOTE — Progress Notes (Signed)
   Palliative Medicine Inpatient Follow Up Note HPI: 72 y.o. male   admitted on 03-Apr-2023 with past  medical history significant of ESRD on HD MWF, Type 2 DM , diet controlled,  anemia of CKD, hypertension, Hyperlipidemia, was brought in for generalized weakness on 04/03/23.   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 Clinical state w/o improvement. Family opted for comfort focused care.  Today's Discussion 04/18/2023  *Please note that this is a verbal dictation therefore any spelling or grammatical errors are due to the "Dragon Medical One" system interpretation.  Chart reviewed inclusive of vital signs, progress notes, laboratory results, and diagnostic images. On morphine gtt at 5mg /hr.   Patient appears peaceful on 2LPM [place].   I spoke to patients significant other, [name] when he moved to [facility]. She and his additional family members are at peace with the present modality of care geared towards comfort. They are open to having chaplain support.   Additional chairs brought to room and per discussion with RN comfort tray ordered.   Questions and concerns addressed/Palliative Support Provided.   Objective Assessment: Vital Signs Vitals:   04/18/23 1600 04/18/23 1707  BP:  (!) 94/55  Pulse: 79 83  Resp: (!) 24 (!) 21  Temp:  99.4 F (37.4 C)  SpO2: 93% 93%    Intake/Output Summary (Last 24 hours) at 04/18/2023 1729 Last data filed at 04/18/2023 1500 Gross per 24 hour  Intake 448.35 ml  Output --  Net 448.35 ml   Last Weight  Most recent update: 04/17/2023  4:51 AM    Weight  116.1 kg (255 lb 15.3 oz)            Gen:  Elderly caucasian M chronically ill in appearance HEENT: moist mucous membranes CV: Regular rate and rhythm  PULM: On 2LPM [place], deep inspirations ABD: soft/nontender EXT: Generalized edema  Neuro:  Somnolent  SUMMARY OF RECOMMENDATIONS   DNAR/DNI  Comfort Measures  Continue low dose morphine gtt  Additional comfort medications per [facility]  Appreciate Chaplain support  The PMT will remain involved to support patient and his family during this difficult time  Billing based on MDM: High ______________________________________________________________________________________ Lamarr Lulas [facility] Pall[facility] Team Team Cell Phone: [phone] Please utilize secure chat with additional questions, if there is no response within 30 minutes please call the above phone number  Palliative Medicine Team providers are available by phone from 7am to 7pm daily and can be reached through the team cell phone.  Should this patient require assistance outside of these hours, please call the patient's attending physician.

## 2023-04-18 NOTE — Plan of Care (Signed)
  Problem: Health Behavior/Discharge Planning: Goal: Ability to manage health-related needs will improve Outcome: Progressing   Problem: Clinical Measurements: Goal: Ability to maintain clinical measurements within normal limits will improve Outcome: Progressing Goal: Respiratory complications will improve Outcome: Progressing   Problem: Activity: Goal: Risk for activity intolerance will decrease Outcome: Progressing   Problem: Nutrition: Goal: Adequate nutrition will be maintained Outcome: Progressing   Problem: Pain Managment: Goal: General experience of comfort will improve Outcome: Progressing

## 2023-04-18 NOTE — Progress Notes (Signed)
This chaplain is present for F/U spiritual care in the setting of EOL. The Pt. family and SO-Sarah are at the bedside participating in story telling. [name] is intermittently suctioning the Pt.  The chaplain listened reflectively as [name] described the Pt. decline and the how the Pt. defined quality of life.   The chaplain extended an invitation for F/U spiritual care as needed.  Chaplain Stephanie Acre [phone]

## 2023-04-18 NOTE — Progress Notes (Signed)
Orthopaedic Trauma Service Progress Note  Patient ID: Peter [name] MRN: [id number] DOB/AGE: 1951-04-02 72 y.o.  Subjective:  Back to CICU   ROS Lethargic  Objective:   VITALS:   Vitals:   04/17/23 2240 04/18/23 0000 04/18/23 0400 04/18/23 0800  BP: (!) 108/56 (!) 102/54 105/62 (!) 105/57  Pulse: 91 87 83 86  Resp: (!) 26 (!) 23 (!) 24 (!) 24  Temp:   99.8 F (37.7 C) 100.2 F (37.9 C)  TempSrc:   Oral Oral  SpO2: 94% 93% 92% 93%  Weight:      Height:        Estimated body mass index is 41.31 kg/m as calculated from the following:   Height as of this encounter: 5\' 6"  (1.676 m).   Weight as of this encounter: 116.1 kg.   Intake/Output      07/18 0701 07/19 0700 07/19 0701 07/20 0700   P.O. 100    I.V. (mL/kg) 0 (0)    Blood 632    IV Piggyback 293.2    Total Intake(mL/kg) 1025.2 (8.8)    Other     Total Output     Net +1025.2         Urine Occurrence 1 x      LABS  Results for orders placed or performed during the hospital encounter of 04-23-2023 (from the past 24 hour(s))  Glucose, capillary     Status: None   Collection Time: 04/17/23 12:00 PM  Result Value Ref Range   Glucose-Capillary 98 70 - 99 mg/dL  Blood gas, venous     Status: Abnormal   Collection Time: 04/17/23 12:24 PM  Result Value Ref Range   pH, Ven 7.48 (H) 7.25 - 7.43   pCO2, Ven 43 (L) 44 - 60 mmHg   pO2, Ven 48 (H) 32 - 45 mmHg   Bicarbonate 32.0 (H) 20.0 - 28.0 mmol/L   Acid-Base Excess 8.1 (H) 0.0 - 2.0 mmol/L   O2 Saturation 74.5 %   Patient temperature 38.2    Collection site PORTNECK    Drawn by [id number]   Glucose, capillary     Status: Abnormal   Collection Time: 04/17/23  4:28 PM  Result Value Ref Range   Glucose-Capillary 113 (H) 70 - 99 mg/dL  Glucose, capillary     Status: Abnormal   Collection Time: 04/17/23  8:34 PM  Result Value Ref Range   Glucose-Capillary 112 (H) 70 - 99 mg/dL   Hemoglobin and hematocrit, blood     Status: Abnormal   Collection Time: 04/17/23  9:10 PM  Result Value Ref Range   Hemoglobin 8.8 (L) 13.0 - 17.0 g/dL   HCT 32.9 (L) 51.8 - 84.1 %  Glucose, capillary     Status: Abnormal   Collection Time: 04/18/23 12:11 AM  Result Value Ref Range   Glucose-Capillary 131 (H) 70 - 99 mg/dL   Comment 1 Notify RN    Comment 2 Document in Chart   Glucose, capillary     Status: None   Collection Time: 04/18/23  4:15 AM  Result Value Ref Range   Glucose-Capillary 87 70 - 99 mg/dL   Comment 1 Notify RN    Comment 2 Document in Chart   CBC     Status: Abnormal   Collection Time: 04/18/23  6:48 AM  Result Value Ref Range   WBC 7.7 4.0 - 10.5 K/uL   RBC 2.66 (L) 4.22 - 5.81 MIL/uL   Hemoglobin 8.4 (L) 13.0 - 17.0 g/dL   HCT 29.5 (L) 62.1 - 30.8 %   MCV 96.6 80.0 - 100.0 fL   MCH 31.6 26.0 - 34.0 pg   MCHC 32.7 30.0 - 36.0 g/dL   RDW 65.7 (H) 84.6 - 96.2 %   Platelets 174 150 - 400 K/uL   nRBC 0.0 0.0 - 0.2 %  Comprehensive metabolic panel     Status: Abnormal   Collection Time: 04/18/23  6:48 AM  Result Value Ref Range   Sodium 129 (L) 135 - 145 mmol/L   Potassium 4.9 3.5 - 5.1 mmol/L   Chloride 90 (L) 98 - 111 mmol/L   CO2 25 22 - 32 mmol/L   Glucose, Bld 99 70 - 99 mg/dL   BUN 50 (H) 8 - 23 mg/dL   Creatinine, Ser 9.52 (H) 0.61 - 1.24 mg/dL   Calcium 84.1 8.9 - 32.4 mg/dL   Total Protein 7.5 6.5 - 8.1 g/dL   Albumin 1.8 (L) 3.5 - 5.0 g/dL   AST 52 (H) 15 - 41 U/L   ALT 8 0 - 44 U/L   Alkaline Phosphatase 69 38 - 126 U/L   Total Bilirubin 2.4 (H) 0.3 - 1.2 mg/dL   GFR, Estimated 11 (L) >60 mL/min   Anion gap 14 5 - 15  Magnesium     Status: None   Collection Time: 04/18/23  6:48 AM  Result Value Ref Range   Magnesium 2.0 1.7 - 2.4 mg/dL  Phosphorus     Status: Abnormal   Collection Time: 04/18/23  6:48 AM  Result Value Ref Range   Phosphorus 5.3 (H) 2.5 - 4.6 mg/dL  Glucose, capillary     Status: None   Collection Time: 04/18/23   8:31 AM  Result Value Ref Range   Glucose-Capillary 96 70 - 99 mg/dL  Blood gas, arterial     Status: Abnormal   Collection Time: 04/18/23 10:35 AM  Result Value Ref Range   pH, Arterial 7.51 (H) 7.35 - 7.45   pCO2 arterial 38 32 - 48 mmHg   pO2, Arterial 74 (L) 83 - 108 mmHg   Bicarbonate 30.3 (H) 20.0 - 28.0 mmol/L   Acid-Base Excess 6.8 (H) 0.0 - 2.0 mmol/L   O2 Saturation 96.9 %   Patient temperature 37.0    Collection site LEFT BRACHIAL    Drawn by [id number]    Allens test (pass/fail) PASS PASS     PHYSICAL EXAM:   Gen: lethargic but arousable Lungs: unlabored Ext:       Left Upper extremity             incisions well healed  No erythema  Minimal swelling at shoulder                    Right lower extremity              R knee portal incisions healed              No erythema             No real tenderness with firm palpation around knee              tolerates PROM but a little uncomfortable (has not been moving around all that much)  Exam stable              knee is warm but to be expected         Right upper extremity              shoulder incisions healed              Swelling improving     Assessment/Plan: 8 Days Post-Op   Anti-infectives (From admission, onward)    Start     Dose/Rate Route Frequency Ordered Stop   04/18/23 1145  vancomycin (VANCOREADY) IVPB 1500 mg/300 mL        1,500 mg 150 mL/hr over 120 Minutes Intravenous  Once 04/18/23 1046     04/18/23 1145  ceFEPIme (MAXIPIME) 1 g in sodium chloride 0.9 % 100 mL IVPB        1 g 200 mL/hr over 30 Minutes Intravenous  Once 04/18/23 1046     04/15/23 1700  ceFAZolin (ANCEF) IVPB 1 g/50 mL premix        1 g 100 mL/hr over 30 Minutes Intravenous  Once 04/15/23 1601 04/15/23 1809   04/14/23 1600  ceFAZolin (ANCEF) IVPB 2g/100 mL premix  Status:  Discontinued        2 g 200 mL/hr over 30 Minutes Intravenous Every M-W-F (Hemodialysis) 04/14/23 1328 04/18/23 1104   04/10/23 1400  ceFAZolin  (ANCEF) IVPB 1 g/50 mL premix  Status:  Discontinued        1 g 100 mL/hr over 30 Minutes Intravenous Every 24 hours 04/10/23 1056 04/14/23 1328   04/06/23 1200  tobramycin 80mg /76mL NS (2 mg/mL) for wound/joint injection        80 mg Intra-articular  Once 04/06/23 1108 04/06/23 1200   04/03/23 1200  nafcillin 2 g in sodium chloride 0.9 % 100 mL IVPB  Status:  Discontinued        2 g 216 mL/hr over 30 Minutes Intravenous Every 4 hours 04/03/23 1112 04/10/23 1056   04/03/23 1145  nafcillin injection 2 g  Status:  Discontinued        2 g Intravenous Every 4 hours 04/03/23 1050 04/03/23 1112   04/02/23 1245  Ampicillin-Sulbactam (UNASYN) 3 g in sodium chloride 0.9 % 100 mL IVPB  Status:  Discontinued        3 g 200 mL/hr over 30 Minutes Intravenous Daily 04/02/23 1152 04/03/23 1050   04/22/2023 2200  ceFAZolin (ANCEF) IVPB 1 g/50 mL premix  Status:  Discontinued        1 g 100 mL/hr over 30 Minutes Intravenous Every 24 hours 04/18/2023 2039 04/02/23 1146   04/15/2023 0830  cefTRIAXone (ROCEPHIN) 2 g in sodium chloride 0.9 % 100 mL IVPB  Status:  Discontinued        2 g 200 mL/hr over 30 Minutes Intravenous Every 24 hours 04/05/2023 0823 04/16/2023 2039   04/15/2023 0830  azithromycin (ZITHROMAX) 500 mg in sodium chloride 0.9 % 250 mL IVPB  Status:  Discontinued        500 mg 250 mL/hr over 60 Minutes Intravenous Every 24 hours 04/03/2023 0823 03/31/2023 2039     .  POD/HD#: 30  72 y/o male with numerous medical comorbidities including ESRD on HD admitted for sepsis with R knee effusion and L shoulder effusion with acute on chronic changes    -sepsis with multiple septic joints---> L shoulder, R shoulder and R knee s/p I&D  All joints grew out staph and pansensitive              No further washouts planned             Continue with management per other services                          WBAT all extremities             ROM as tolerated all extremities               Ice PRN op sites                 Sutures removed by myself  Leave surgical sites open to air                 - ID Continue abx at the direction of the ID service     - Dispo:             please call with questions    Mearl Latin, PA-C [phone] (C) 04/18/2023, 11:59 AM  [facility] [address] [place] [address] [phone] [place] [phone] (F)    After 5pm and on the weekends please log on to Amion, go to orthopaedics and the look under the [facility] Call for the provider(s) on call. You can also call our office at [phone] and then follow the prompts to be connected to the call team.  Patient ID: Peter [name], male   DOB: [birth year], 72 y.o.   MRN: [id number]

## 2023-04-18 NOTE — Progress Notes (Signed)
Pt transferred from [facility] for down grade in care to Comfort Care measures per MD's orders.  He was tranferred via bed accompanied by a Care RN.  Assisted RN in getting pt settled into his new surrounding.  Obtained VS and assessed pt.  Pt is unresponsive and is on a morphine infusion for EOL Care/ pain.  Spoke with family and reviewed POC.  Made myself present and informed them that this Care RN would attend to their needs.  Answered any pending questions.  No further questions at this moment.  Instructed pt's family to utilize RN call light for assistance.

## 2023-04-18 NOTE — Progress Notes (Signed)
PROGRESS NOTE  Peter [name] ZOX:[id number] DOB: August 10, 1951 DOA: 04-10-2023 PCP: [name], MD  HPI/Recap of past 5 hours: 72 year old gentleman with history of ESRD on hemodialysis MWF(right upper extremity AV fistula), chronic A-fib on Eliquis, type 2 diabetes, anemia of chronic disease, hypertension, hyperlipidemia, obstructive sleep apnea not on CPAP, who initially presented from home with generalized weakness, not feeling well, nausea, vomiting.  He sustained a fall onto his right knee and left shoulder. Apr 10, 2023, admitted to medical floor for suspected sepsis, fever 102.3.  Blood cultures positive for MSSA bacteremia with unclear source. 7/3, hemodialysis, right knee joint aspiration Overnight hypotension and more somnolent.  Transferred to ICU.  On vasopressors and midodrine. 7/8, I&D of the right knee and bilateral shoulders 7/11, TEE with suspected endocarditis.  Repeat blood cultures negative.  MRI brain negative. 7/16-7/17, transferred to medical floor after being off vasopressors. Followed by nephrology and ID.  CT chest with contrast, completed on 04/16/2023. 7/18 acute blood loss anemia with 2U PRBCs transfused for Hg 6.5K  04/18/23:  Hypersomnolent with soft BP's despite po midodrine, worsening clinical picture.  PCCM re-consulted.  Transferred back to ICU.  IV Vancomycin and Zosyn started.  Code status changed by POA to DNR.  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  Status post washout of shoulder and knee joint.  Local wound care to continue. Blood culture 7/2, MSSA.  Wound culture 7/3 MSSA All wound cultures with rare Staph aureus. Blood cultures times x 2 on 04/17/23, continue to follow, no growth thus far Followed by ID.  Currently on cefazolin.  ID recommends cefazolin 2 g IV with hemodialysis for 8 weeks since last debridement of 7/8. Suspected endocarditis due to new severe mitral regurgitation, currently not a surgical candidate because of being very frail and debilitated.  Treating conservatively. Maintain MAP greater than 65, BPs are currently soft P.o. midodrine 10 mg x 1 given on 04/17/2023 for MAP of 60. Transferred back to the ICU on 04/18/23 IV Vancomycin and Zosyn initiated; IV Cefazolin held. Appreciate CCM assistance  Acute metabolic encephalopathy, unclear etiology Abdominal tenderness on exam+ acute blood loss anemia + abdominal distention Pan CT to rule out occult pathalogy  Bilateral pleural effusions, seen on CT scan done on 04/16/2023 Volume status managed with hemodialysis Mobilize as tolerated Incentive spirometer Currently requiring 2 L to maintain his O2 saturation above 92%   ESRD on hemodialysis on HD MWF:  Volume status and electrolytes managed with hemodialysis.    Paroxysmal A-fib:  Currently on amiodarone and Eliquis.   Acute blood loss anemia Hemoglobin dropped to 6.5 from 7.0 on 7/18 Transfused 2U PRBCs>> 8.8>>8.4 Continue to monitor and trend H&H  Physical debility PT OT assessment Fall precautions.  Goals of care Palliative care medicine consulted to assist with establishing goals of care. Code  status changed from Full code to DNR on 04/18/23   DVT  prophylaxis: Place and maintain sequential compression device Start: 04/02/23 1116 apixaban (ELIQUIS) tablet 5 mg      Time: 50 mins    Code Status: Full code  Family Communication: Updated the patient's significant other who is also the patient's medical POA on the phone on 04/18/23  Disposition Plan: Likely will discharge to home once hemodynamically stable and PCCM, General surgery, and ID sign off.   Consultants: Infectious disease Nephrology Orthopedic surgery. PCCM  Procedures: I&D Hemodialysis  Antimicrobials: Cefazolin, stopped on 04/18/23 IV Vancomycin 7/19 IV Zosyn 7/19  DVT prophylaxis: Eliquis  Status is: Inpatient The patient requires at least 2 midnights for further evaluation and treatment of present condition.    Objective: Vitals:   04/17/23 2240 04/18/23 0000 04/18/23 0400 04/18/23 0800  BP: (!) 108/56 (!) 102/54 105/62 (!) 105/57  Pulse: 91 87 83 86  Resp: (!) 26 (!) 23 (!) 24 (!) 24  Temp:   99.8 F (37.7 C) 100.2 F (37.9 C)  TempSrc:   Oral Oral  SpO2: 94% 93% 92% 93%  Weight:      Height:        Intake/Output Summary (Last 24 hours) at 04/18/2023 1120 Last data filed at 04/18/2023 0400 Gross per 24 hour  Intake 732 ml  Output --  Net 732 ml   Filed Weights   04/16/23 1318 04/16/23 1748 04/17/23 0400  Weight: 117.8 kg 115.8 kg 116.1 kg    Exam:  General: 72 y.o. year-old male [place] appearing and hypersomnolent Cardiovascular: Irregular rate and rythmwith no rubs or gallops.  No thyromegaly or JVD noted.   Respiratory: Mild rales at bases.  No wheezing noted.  Poor inspiratory effort. Abdomen: Soft nontender nondistended with normal bowel sounds x4 quadrants. Musculoskeletal: Dependent edema upper extremities. Skin: No ulcerative lesions noted or rashes, Psychiatry: Unable to assess mood due to hypersomnolence   Data Reviewed: CBC: Recent Labs  Lab 04/14/23 1319 04/15/23 0210 04/16/23 0442 04/17/23 0445  04/17/23 2110 04/18/23 0648  WBC 11.6* 11.2* 6.7 5.4  --  7.7  HGB 7.5* 7.3* 7.0* 6.5* 8.8* 8.4*  HCT 20.9* 20.6* 20.1* 19.3* 25.8* 25.7*  MCV 94.1 92.8 96.6 98.0  --  96.6  PLT 114* 121* 137* 141*  --  174   Basic Metabolic Panel: Recent Labs  Lab 04/12/23 0400 04/13/23 0431 04/16/23 1151 04/17/23 0445 04/18/23 0648  NA 128* 126* 131*  --  129*  K 3.6 4.1 4.6  --  4.9  CL 92* 92* 90*  --  90*  CO2 24 23 27   --  25  GLUCOSE 116* 91 133*  --  99  BUN 29* 45* 40*  --  50*  CREATININE 4.29* 5.50* 4.83*  --  5.33*  CALCIUM 7.2* 7.8* 9.3  --  10.1  MG 1.8  --   --  1.8 2.0  PHOS 4.2 5.6* 4.3  --  5.3*   GFR: Estimated Creatinine Clearance: 14.6 mL/min (A) (by C-G formula based on SCr of 5.33 mg/dL (H)). Liver Function Tests: Recent Labs  Lab 04/14/23 0357 04/15/23 0131 04/16/23 0442 04/16/23 1151 04/17/23 0445 04/18/23 0648  AST 32 36 36  --  36 52*  ALT 7 8 7   --  6 8  ALKPHOS 77 99 95  --  78 69  BILITOT 3.5* 3.1* 2.7*  --  2.5* 2.4*  PROT 6.6 6.5 6.8  --  7.0 7.5  ALBUMIN 1.7* 1.7* 1.7* 1.6* 2.1* 1.8*   No results for input(s): "LIPASE", "AMYLASE" in the last 168 hours. No results for input(s): "AMMONIA" in the last 168 hours.  Coagulation Profile: Recent Labs  Lab 04/12/23 1345  INR 1.6*   Cardiac Enzymes: No results for input(s): "CKTOTAL", "CKMB", "CKMBINDEX", "TROPONINI" in the last 168 hours. BNP (last 3 results) No results for input(s): "PROBNP" in the last 8760 hours. HbA1C: No results for input(s): "HGBA1C" in the last 72 hours. CBG: Recent Labs  Lab 04/17/23 1628 04/17/23 2034 04/18/23 0011 04/18/23 0415 04/18/23 0831  GLUCAP 113* 112* 131* 87 96   Lipid Profile: No results for input(s): "CHOL", "HDL", "LDLCALC", "TRIG", "CHOLHDL", "LDLDIRECT" in the last 72 hours. Thyroid Function Tests: No results for input(s): "TSH", "T4TOTAL", "FREET4", "T3FREE", "THYROIDAB" in the last 72 hours. Anemia Panel: No results for input(s):  "VITAMINB12", "FOLATE", "FERRITIN", "TIBC", "IRON", "RETICCTPCT" in the last 72 hours.  Urine analysis:    Component Value Date/Time   COLORURINE STRAW (A) 05/29/2018 2016   APPEARANCEUR CLEAR 05/29/2018 2016   LABSPEC 1.009 05/29/2018 2016   PHURINE 5.0 05/29/2018 2016   GLUCOSEU 50 (A) 05/29/2018 2016   HGBUR NEGATIVE 05/29/2018 2016   BILIRUBINUR NEGATIVE 05/29/2018 2016   KETONESUR NEGATIVE 05/29/2018 2016   PROTEINUR 30 (A) 05/29/2018 2016   UROBILINOGEN 0.2 03/23/2008 1424   NITRITE NEGATIVE 05/29/2018 2016   LEUKOCYTESUR NEGATIVE 05/29/2018 2016   Sepsis Labs: @LABRCNTIP (procalcitonin:4,lacticidven:4)  ) Recent Results (from the past 240 hour(s))  Culture, blood (Routine X 2) w Reflex to ID Panel     Status: None (Preliminary result)   Collection Time: 04/17/23 11:05 AM   Specimen: BLOOD RIGHT HAND  Result Value Ref Range Status   Specimen Description BLOOD RIGHT HAND  Final   Special Requests   Final    BOTTLES DRAWN AEROBIC AND ANAEROBIC Blood Culture adequate volume   Culture   Final    NO GROWTH < 24 HOURS Performed at [facility], [address] [place], [place] [address]    Report Status PENDING  Incomplete  Culture, blood (Routine X 2) w Reflex to ID Panel     Status: None (Preliminary result)   Collection Time: 04/17/23 11:05 AM   Specimen: BLOOD RIGHT HAND  Result Value Ref Range Status   Specimen Description BLOOD RIGHT HAND  Final   Special Requests   Final    BOTTLES DRAWN AEROBIC AND ANAEROBIC Blood Culture adequate volume   Culture   Final    NO GROWTH < 24 HOURS Performed at [facility], [address], [place], [place] [address]    Report Status PENDING  Incomplete      Studies: No results found.  Scheduled Meds:  (feeding supplement) PROSource Plus  30 mL Oral TID BM   amiodarone  200 mg Oral BID   apixaban  5 mg Oral BID   atorvastatin  10 mg Oral QHS   Chlorhexidine Gluconate Cloth  6 each Topical Q0600   [START ON  04/23/2023] darbepoetin (ARANESP) injection - DIALYSIS  100 mcg Subcutaneous Q Wed-1800   feeding supplement  237 mL Oral TID BM   ferric citrate  630 mg Oral TID WC   insulin aspart  0-9 Units Subcutaneous Q4H   midodrine  5 mg Oral TID WC   multivitamin  1 tablet Oral QHS   pantoprazole (PROTONIX) IV  40 mg Intravenous Q12H    Continuous Infusions:  sodium chloride Stopped (04/11/23  4098)   albumin human 60 mL/hr at 04/17/23 1848   ceFEPime (MAXIPIME) IV     vancomycin       LOS: 17 days     Darlin Drop, MD [facility] Pager [phone]  If 7PM-7AM, please contact night-coverage [email or web address] Password [facility] 04/18/2023, 11:20 AM

## 2023-04-18 NOTE — Progress Notes (Signed)
Pharmacy Antibiotic Note  Peter [name] is a 72 y.o. male on day #9 Cefazolin for disseminated MSSA infection with polyarticular septic arthritis/bacteremia .  Pharmacy has been consulted for escalation to Vancomycin and Cefepime dosing.  Cefazolin was intended for 8-week course from last debridement 04/07/23. Cefazolin doses have been scheduled post-HD, last given 7/17 post-HD. HD planned later today.   Plan: Vancomycin 1500 mg IV x 1 Cefepime 1gm IV x 1. Will follow up for HD session and re-dosing antibiotics. Follow up repeat blood cultures, clinical status, and ID input.  Height: 5\' 6"  (167.6 cm) Weight: 116.1 kg (255 lb 15.3 oz) IBW/kg (Calculated) : 63.8  Temp (24hrs), Avg:100.6 F (38.1 C), Min:98.4 F (36.9 C), Max:101.7 F (38.7 C)  Recent Labs  Lab 04/12/23 0400 04/13/23 0431 04/14/23 1319 04/15/23 0210 04/16/23 0442 04/16/23 1151 04/17/23 0445 04/18/23 0648  WBC 17.5* 14.7* 11.6* 11.2* 6.7  --  5.4 7.7  CREATININE 4.29* 5.50*  --   --   --  4.83*  --  5.33*    Estimated Creatinine Clearance: 14.6 mL/min (A) (by C-G formula based on SCr of 5.33 mg/dL (H)).    Allergies  Allergen Reactions   Doxazosin Mesylate     ineffective for BP (10/2014)   Triamterene-Hctz     increased creatinine (see 09/2014 office note)    Antimicrobials this admission: Ceftriaxone x 1 on 04-30-2023 Azithromycin x 1 on 04/30/23 Cefazolin 7/2 x 1 , 7/11>>7/19  Unasyn 7/3 >> 7/4 Nafcillin 7/4 >>7/11 Tobra intra-articular x 1 on 7/7  Cefepime 7/19>> Vanc 7/19>>  Microbiology results: Apr 30, 2023 blood: MSSA 7/2 blood: neg 04/30/2023 BCID: 2/4 MSSA 7/2 COVID, flu and RSV: neg 7/3 MRSA PCR: neg 7/3 L knee fluid: MSSA 7/4 blood: neg 7/6 R shoulder joint: MSSA 7/6 L shoulder joint: neg 7/8 synovial fluid: MSSA 7/18 blood: no growth < 24 hrs to date 7/19 blood:   Thank you for allowing pharmacy to be a part of this patient's care.  Dennie Fetters, [place] 04/18/2023 10:51 AM

## 2023-04-18 NOTE — Progress Notes (Signed)
Nutrition Brief Note  Chart reviewed. Pt now transitioning to comfort care.  No further nutrition interventions planned at this time.  Please re-consult as needed.   Allie Lachocki, RDN, LDN Clinical Nutrition  

## 2023-04-18 NOTE — Progress Notes (Addendum)
NAME:  Peter [name], MRN:  [id number], DOB:  Jul 17, 1951, LOS: 17 ADMISSION DATE:  04/23/2023, CONSULTATION DATE:  04/25/2023 REFERRING MD:  Dr. Thedore Mins - [facility], CHIEF COMPLAINT:   weakness/ fall/ NV   History of Present Illness:  32 yoM with PMH as below, significant for ESRD on MWF iHD via RUE AVF, afib on Eliquis, T2DM, anemia of CKD, HTN, HL, and OSA not on CPAP who presented with weakness from home. Started not to feel well on Sunday, 6/30 with nausea and vomiting. Sustained a fall, onto right knee and left shoulder pain. Admitted to [facility] on 7/2 for further workup with suspected sepsis, febrile 102.3 on admit. Blood cultures positive for MSSA bacteremia, source unclear at this time, being treated additionally for aspiration pneumonia, and mild transaminitis. Nephrology, ID, GI, and palliative care following. Ortho consulted and underwent aspiration of right knee showing many WBC, primarily PMNs. He underwent iHD 7/3 with 1.4L removed 7/3. TTE pending. Overnight, had intermittent low blood pressure episodes and patient is more sonolucent. Given concern for worsening sepsis, PCCM consulted.   Pertinent  Medical History  ESRD on MWF iHD via RUE AVF, afib on Eliquis, T2DM, anemia of CKD, HTN, HL, and OSA not on CPAP   Significant Hospital Events: Including procedures, antibiotic start and stop dates in addition to other pertinent events   7/2 admitted  7/3 iHD, right knee joint asp 7/8 to OR for I&D of right knee and bilateral shoulders.  7/9 iHD, eliquis restarted  7/10 poor PO intake, increased midodrine, iHD UF 1.5L, eliquis held for concern of bloody stools, WBC up/ afebrile 7/11 MRI brain neg, nafcillin changed to cefazolin, TEE 7/19 tx to icu  Interim History / Subjective:  Tx 2 uprbc hypotensive lethargic   Objective   Blood pressure (!) 105/57, pulse 86, temperature 100.2 F (37.9 C), temperature source Oral, resp. rate (!) 24, height 5\' 6"  (1.676 m), weight 116.1 kg, SpO2 93%.         Intake/Output Summary (Last 24 hours) at 04/18/2023 0951 Last data filed at 04/18/2023 0400 Gross per 24 hour  Intake 1025.2 ml  Output --  Net 1025.2 ml   Filed Weights   04/16/23 1318 04/16/23 1748 04/17/23 0400  Weight: 117.8 kg 115.8 kg 116.1 kg   Examination:  General:  lethargic HEENT: MM pink/moist Neuro: lethargic but follows commands CV: hsir PULM:  decreased bs  GI: tender to touch, disteneded Extremities: rt knee hot to touch  Skin: no rashes or lesions   Resolved Hospital Problem list   Toxic/ metabolic encephalopathy Aspiration PNA  Assessment & Plan:  Sepsis with MSSA bacteremia with 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 including bilateral shoulders, right knee and right wrist- s/p washout in OR 7/8 with ortho  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  ESRD on IHD MWF Physicial deconditioning, severe, acute on chronic Moderate relative protein calorie malnutrition Low grade DIC +/- hemolysis +/- ABLA-  keep eye on plts, usual transfusion thresholds, okay to continue [facility] for now Biliary stasis- unclear cause, watch for now ICU delirium- improved today, ammonia neg PAF, frequent ectopy- improved on amio, continues on eliquis NEW 7/19 blood loss anemia Abd distension Hypotension Lethargy Rt knee hot   7/19 tc to icu Ct abd Restart vanc/cefepime Pan cululture Resume midodrine  Consider tap rt knee ID consult HD per renal MVR in future     Best Practice (right click and "Reselect all SmartList Selections" daily)   Diet/type: Regular consistency (see  orders)> heart healthy  DVT prophylaxis: systemic heparin> DOAC GI prophylaxis: PPI Lines: Central line Foley:  N/A Code Status:  full code Last date of multidisciplinary goals of care discussion: Full code with all aggressive interventions per patient   Attending Addendum 04/18/2023 Seen and examined.  Called back for hypotension, fevers, AMS.  Looks pretty rough,  c/o back and hip pain.  Agitated but redirectable.  Family at bedside to corroborate hx.  He has ongoing anasarca, unchanged.  Pain in R knee at site of prior infection requiring washout  Tachypneic  Being started on levophed  Anemia yesterday noted  This is some combination ABLA or recurrent septic shock  - CT A/P - Repeat CBC, lactate, blood cultures - Broaden abx to vanc/zosyn - Stop eliquis - Dilaudid and precedex to help pain and agitation - Discussed with family, he would not want to go back on life support should he worsen, agreeable to pressors PRN; DNR entered - Guarded prognosis, will circle back after labs to see if this is something reversible  Total CC time 45 mins Myrla Halsted MD PCCM

## 2023-04-18 NOTE — Progress Notes (Signed)
OT Cancellation Note  Patient Details Name: Peter [name] MRN: [id number] DOB: July 02, 1951   Cancelled Treatment:    Reason Eval/Treat Not Completed: Medical issues which prohibited therapy. Pt transferred to CICU due to hypotension and lethargy. OT will follow up as appropriate    Galen Manila 04/18/2023, 1:05 PM

## 2023-04-18 NOTE — Progress Notes (Signed)
PT Cancellation Note  Patient Details Name: Peter Keyworth MRN: [id number] DOB: May 15, 1951   Cancelled Treatment:    Reason Eval/Treat Not Completed: Medical issues which prohibited therapy - transferred to CICU for hypotension and lethargy.   Marye Round, PT DPT Acute Rehabilitation Services Secure Chat Preferred  Office [phone]    Truddie Coco 04/18/2023, 12:21 PM

## 2023-04-18 NOTE — Op Note (Unsigned)
Peter [name], Peter [name] MEDICAL RECORD NO: [id number] ACCOUNT NO: [id number] DATE OF BIRTH: 1950/12/17 FACILITY: [facility] LOCATION: [facility] PHYSICIAN: Doralee Albino. Carola Frost, MD  Operative Report   DATE OF PROCEDURE: 04/07/2023  PREOPERATIVE DIAGNOSES: 1.  Staph aureus sepsis. 2.  Septic arthritis, right and left shoulders. 3.  Septic arthritis, right knee. 4.  Right wrist pain.  POSTOPERATIVE DIAGNOSES: 1.  Staph aureus sepsis. 2.  Septic arthritis, right and left shoulders. 3.  Septic arthritis, right knee. 4.  Right wrist pain.  PROCEDURES: 1.  Arthrotomy, right knee for infection. 2.  Arthrotomy, right shoulder for infection. 3.  Left shoulder arthrotomy for infection. 4.  Aspiration of right wrist for possible infection.  SURGEON:  Myrene Galas, MD  ASSISTANT:  Montez Morita, PA-C.  ANESTHESIA:  General.  COMPLICATIONS:  None.  TOURNIQUET:  None.  SPECIMENS:  Multiple including anaerobic, aerobic from each joint.  DISPOSITION:  To ICU.  CONDITION:  Hemodynamically stable.  BRIEF SUMMARY OF INDICATIONS FOR PROCEDURE:  The patient is a 72 year old with multiple medical problems including dialysis, diabetes and septic arthritis.  We had discussed with the family the risks and benefits of surgical debridement for incision and  drainage of these infected joints post-aspiration of the right wrist for possible sepsis and in fact that procedure had been planned and anticipated earlier, but had to be postponed due to the patient's hemodynamic instability and low blood pressure.   They acknowledged the risk including death, persistent infection, need for further surgery and others.  BRIEF SUMMARY OF PROCEDURE:  The patient was taken to the operating room where he was positioned on the table and anesthesia transferred to the anesthesia machine.  I prepped the right wrist with chlorhexidine wash, Betadine scrub and paint, performed an  aspiration which did not yield any significant fluid,  but rather less than a mL of clear material that was not of sufficient quantity to be cultured.  Ii did not appear to have difficulty entering the wrist joint.  Positioning was then carefully performed such that both shoulders and the right knee could all be accessed under 1 draping. This consisted of chlorhexidine wash, Betadine scrub and paint and then again careful draping.  Timeout was held, began with the  left shoulder performing careful placement of a standard posterior shoulder portal into the joint and then an anterior one just superior to the coracoid.  I then flushed 3000 mL of saline through this.  Removal of the stylets did demonstrate some  purulent material and loss of joint fluid.  It did appear highly inflammatory and not a severely purulent however.  The shoulder was gently taken through range of motion throughout the incision and drainage.  The cannula was then withdrawn from the joint  and nylon used to close the portal sites.  Next, attention was turned to the contralateral knee.  Here, an inflow portal was established as well as an outflow and again a copious return of joint fluid, which was highly inflammatory and appeared  infected.  Cultures again obtained.  A copious lavage performed again with 3000 mL of saline as the knee was taken through a range of motion.  These portals were then closed with nylon.  Next, attention was turned to the right shoulder.  Here, a standard  posterior portal was established and an anterior one, again entering the joint in this case we had copious return of grossly purulent material, then 6000 mL of saline were flushed through this joint while performing range  of motion of the shoulder  gently as well.  Montez Morita, PA-C was present assisting me throughout.  Right shoulder was closed with a nylon for the sutures also.  The patient unfortunately tolerated the procedure without any significant hypotension and was able to be transferred  back to his bed  and then the ICU in stable condition after application of sterile gently compressive dressings at all the surgical sites.  PROGNOSIS:  The patient remains in extremis in critical condition.  Ongoing workup and treatment of his sepsis continues with the primary team.  He is at high risk for arthritis with regard to these orthopedic manifestations.   SHY D: 04/18/2023 4:47:08 pm T: 04/18/2023 5:38:00 pm  JOB: [id number]/ [id number]

## 2023-04-18 NOTE — Progress Notes (Signed)
[facility] NEPHROLOGY PROGRESS NOTE  Assessment/ Plan:  # Sepsis/ aspiration pneumonia / MSSA bacteremia: 1st set Blood Cx 7/2 positive w/ aspirate from knee also + for staph aureus.  AVG without signs of infection. SP washout/ I&D of bilat shoulder and R knee 7/08 per ortho.  ID following, was on nafcillin, now IV ancef. C/f left SE joint shoulder septic arthritis. Moved to ICU today.  #ESRD: HD is MWF. He is now moved to ICU, low BP and AMS. D/w pt's significant other, brother and pt's daughter at the bedside.Pt doesn't want to do dialysis. Per family members, this is align with his wishes and doesn't want heroic measures. Hold HD today, consult palliative care for possible comfort measures/hospice etc. D/w Dr. Katrinka Blazing as well.   # Hypotension: BP worsen and now moved to ICU. On midodrine, may need pressors.   # Anemia of CKD: fe panel ordered, rec'd ESA 7/15. Avoiding IV Fe in the context of bacteremia. PRBC as need.   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 Seems like sensipar on hold because of hypocalemia which is resolved now. On [place] w/meal.   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 Pt is not candidate for repair/ replacement at this time per cardiology.   Discussed with family members, RN and primary team.   Outpatient Dialysis Orders:  MWF [name] 4hr  450/800     97.5kg    3K/2Ca   AVG   15g   Heparin 10,000 bolus - no ESA, Hgb > 12 - No VDRA (recently stopped d/t Ca 10.9)   Subjective:  Seen and examined in ICU. Event noted as he was moved ICU for AMS, hypotension. He is surrounded by multiple family members, ROS limited. Objective Vital signs in last 24 hours: Vitals:   04/18/23 1125 04/18/23 1130 04/18/23 1200 04/18/23 1230  BP: 102/66 107/75 (!) 115/58 103/62  Pulse: 90 85 98 93  Resp: (!) 25 (!) 24 (!) 26 (!) 23  Temp:      TempSrc:      SpO2: 90% (!) 87% 93% 92%  Weight:      Height:       Weight change:   Intake/Output Summary (Last 24  hours) at 04/18/2023 1302 Last data filed at 04/18/2023 0400 Gross per 24 hour  Intake 732 ml  Output --  Net 732 ml       Labs: RENAL PANEL Recent Labs  Lab 04/12/23 0400 04/13/23 0431 04/14/23 0357 04/15/23 0131 04/16/23 0442 04/16/23 1151 04/17/23 0445 04/18/23 0648  NA 128* 126*  --   --   --  131*  --  129*  K 3.6 4.1  --   --   --  4.6  --  4.9  CL 92* 92*  --   --   --  90*  --  90*  CO2 24 23  --   --   --  27  --  25  GLUCOSE 116* 91  --   --   --  133*  --  99  BUN 29* 45*  --   --   --  40*  --  50*  CREATININE 4.29* 5.50*  --   --   --  4.83*  --  5.33*  CALCIUM 7.2* 7.8*  --   --   --  9.3  --  10.1  MG 1.8  --   --   --   --   --  1.8 2.0  PHOS  4.2 5.6*  --   --   --  4.3  --  5.3*  ALBUMIN <1.5* <1.5*  <1.5*   < > 1.7* 1.7* 1.6* 2.1* 1.8*   < > = values in this interval not displayed.    Liver Function Tests: Recent Labs  Lab 04/16/23 0442 04/16/23 1151 04/17/23 0445 04/18/23 0648  AST 36  --  36 52*  ALT 7  --  6 8  ALKPHOS 95  --  78 69  BILITOT 2.7*  --  2.5* 2.4*  PROT 6.8  --  7.0 7.5  ALBUMIN 1.7* 1.6* 2.1* 1.8*   No results for input(s): "LIPASE", "AMYLASE" in the last 168 hours. No results for input(s): "AMMONIA" in the last 168 hours. CBC: Recent Labs    04/14/23 1319 04/15/23 0210 04/16/23 0442 04/17/23 0445 04/17/23 2110 04/18/23 0648  HGB 7.5* 7.3* 7.0* 6.5* 8.8* 8.4*  MCV 94.1 92.8 96.6 98.0  --  96.6  FERRITIN 3,618*  --   --   --   --   --   TIBC NOT CALCULATED  --   --   --   --   --   IRON 28*  --   --   --   --   --     Cardiac Enzymes: No results for input(s): "CKTOTAL", "CKMB", "CKMBINDEX", "TROPONINI" in the last 168 hours. CBG: Recent Labs  Lab 04/17/23 1628 04/17/23 2034 04/18/23 0011 04/18/23 0415 04/18/23 0831  GLUCAP 113* 112* 131* 87 96    Iron Studies: No results for input(s): "IRON", "TIBC", "TRANSFERRIN", "FERRITIN" in the last 72 hours. Studies/Results: CT CHEST ABDOMEN PELVIS WO  CONTRAST  Result Date: 04/18/2023 CLINICAL DATA:  Fever, abdominal pain, delirium EXAM: CT CHEST, ABDOMEN AND PELVIS WITHOUT CONTRAST TECHNIQUE: Multidetector CT imaging of the chest, abdomen and pelvis was performed following the standard protocol without IV contrast. RADIATION DOSE REDUCTION: This exam was performed according to the departmental dose-optimization program which includes automated exposure control, adjustment of the mA and/or kV according to patient size and/or use of iterative reconstruction technique. COMPARISON:  CT chest, 04/16/2023, CT abdomen pelvis, 04/02/2023 FINDINGS: Examination is again limited by lack of intravenous contrast and patient arm positioning with associated streak artifact. CT CHEST FINDINGS Cardiovascular: Left neck vascular catheter. Aortic atherosclerosis. Aortic valve calcifications. Normal heart size. Three-vessel coronary artery calcifications. No pericardial effusion. Mediastinum/Nodes: No enlarged mediastinal, hilar, or axillary lymph nodes. Thyroid gland, trachea, and esophagus demonstrate no significant findings. Lungs/Pleura: Unchanged moderate, right-greater-than-left pleural effusions and associated atelectasis or consolidation. Musculoskeletal: No significant change in appearance of the left sternoclavicular joint, with mild, asymmetric distention of the joint capsule (series 5, image 13). No bony erosion or sclerosis. Unchanged bilateral glenohumeral joint effusions (series 5, image 8). No acute osseous findings. CT ABDOMEN PELVIS FINDINGS Hepatobiliary: No solid liver abnormality is seen. Excreted contrast or sludge in the gallbladder. No gallstones, gallbladder wall thickening, or biliary dilatation. Pancreas: Unremarkable. No pancreatic ductal dilatation or surrounding inflammatory changes. Spleen: Normal in size without significant abnormality. Adrenals/Urinary Tract: Adrenal glands are unremarkable. Severely atrophic, partially calcified left kidney. No  hydronephrosis. No right-sided calculi or hydronephrosis. Bladder is unremarkable. Stomach/Bowel: Stomach is within normal limits. Appendix appears normal. No evidence of bowel wall thickening, distention, or inflammatory changes. Vascular/Lymphatic: Aortic atherosclerosis. No enlarged abdominal or pelvic lymph nodes. Reproductive: No mass or other abnormality. Other: Fat containing bilateral inguinal hernias. Anasarca. Small volume free fluid in the low abdomen and pelvis. Musculoskeletal: No acute osseous  findings. Status post left hip total arthroplasty. Disc degenerative disease and bridging osteophytosis throughout the thoracic and lumbar spine, in keeping with DISH. IMPRESSION: 1. Unchanged moderate, right-greater-than-left pleural effusions and associated atelectasis or consolidation. 2. No significant change in appearance of the left sternoclavicular joint, with mild, asymmetric distention of the joint capsule. No bony erosion or sclerosis. Findings are nonspecific and may be degenerative or inflammatory, though septic arthritis is not excluded in the setting of fever. 3. Small volume free fluid in the low abdomen and pelvis. Anasarca. 4. Severely atrophic, partially calcified left kidney. No hydronephrosis. 5. Coronary artery disease. Aortic Atherosclerosis (ICD10-I70.0). Electronically Signed   By: Jearld Lesch M.D.   On: 04/18/2023 11:47   CT HEAD WO CONTRAST ( )  Result Date: 04/18/2023 CLINICAL DATA:  Delirium. EXAM: CT HEAD WITHOUT CONTRAST TECHNIQUE: Contiguous axial images were obtained from the base of the skull through the vertex without intravenous contrast. RADIATION DOSE REDUCTION: This exam was performed according to the departmental dose-optimization program which includes automated exposure control, adjustment of the mA and/or kV according to patient size and/or use of iterative reconstruction technique. COMPARISON:  Head CT 04-05-23 and MRI 04/10/2023 FINDINGS: The study is motion  degraded, severely so below the skull base and mildly to moderately through the brain. Brain: Within limitations of motion artifact, no acute infarct, intracranial hemorrhage, mass, midline shift, or extra-axial fluid collection is identified. There is mild cerebral atrophy. Vascular: Calcified atherosclerosis at the skull base. No gross hyperdense vessel. Skull: No acute fracture or suspicious osseous lesion identified within limitations of motion artifact. Sinuses/Orbits: Mild mucosal thickening and small volume fluid in the right maxillary sinus. Small mucous retention cyst in the left maxillary sinus. Clear mastoid air cells. Bilateral cataract extraction. Other: None. IMPRESSION: Motion degraded examination without evidence of an acute intracranial abnormality. Electronically Signed   By: Sebastian Ache M.D.   On: 04/18/2023 11:40    Medications: Infusions:  sodium chloride Stopped (04/11/23 0946)   albumin human 60 mL/hr at 04/17/23 1848   dexmedetomidine (PRECEDEX) IV infusion     piperacillin-tazobactam (ZOSYN)  IV     vancomycin      Scheduled Medications:  (feeding supplement) PROSource Plus  30 mL Oral TID BM   amiodarone  200 mg Oral BID   atorvastatin  10 mg Oral QHS   Chlorhexidine Gluconate Cloth  6 each Topical Q0600   [START ON 04/23/2023] darbepoetin (ARANESP) injection - DIALYSIS  100 mcg Subcutaneous Q Wed-1800   feeding supplement  237 mL Oral TID BM   ferric citrate  630 mg Oral TID WC   insulin aspart  0-9 Units Subcutaneous Q4H   midodrine  5 mg Oral TID WC   multivitamin  1 tablet Oral QHS   pantoprazole (PROTONIX) IV  40 mg Intravenous Q12H    have reviewed scheduled and prn medications.  Physical Exam: General:ill looking, somnolent male. Heart:RRR, s1s2 nl Lungs:clear b/l, no crackle Abdomen:soft, Non-tender, non-distended Extremities:Trace LE edema Dialysis Access: R AVG +b/t.   Peter [name] Peter [name] 04/18/2023,1:02 PM  LOS: 17 days

## 2023-04-18 NOTE — Plan of Care (Signed)
  Problem: Pain Managment: Goal: General experience of comfort will improve Outcome: Progressing   

## 2023-04-19 LAB — CULTURE, BLOOD (ROUTINE X 2): Special Requests: ADEQUATE

## 2023-04-21 ENCOUNTER — Telehealth: Payer: Self-pay | Admitting: Internal Medicine

## 2023-04-21 LAB — CULTURE, BLOOD (ROUTINE X 2)

## 2023-04-21 NOTE — Telephone Encounter (Signed)
Peter [name] checking on signature of death cerrtificate. Peter [name] phone number is [phone].

## 2023-04-21 NOTE — Telephone Encounter (Signed)
Spoke with Chales Abrahams, death certificate has been signed.

## 2023-04-22 LAB — CULTURE, BLOOD (ROUTINE X 2)
Culture: NO GROWTH
Special Requests: ADEQUATE

## 2023-05-01 NOTE — Progress Notes (Signed)
   Palliative Medicine Inpatient Follow Up Note   I met with patients significant other, Sarah in the hallway of [address]. She informed me that Donnie had just passed.  Provided emotional support through therapeutic listening.   No Charge ______________________________________________________________________________________ Lamarr Lulas [facility] Palliative Medicine Team Team Cell Phone: [phone] Please utilize secure chat with additional questions, if there is no response within 30 minutes please call the above phone number  Palliative Medicine Team providers are available by phone from 7am to 7pm daily and can be reached through the team cell phone.  Should this patient require assistance outside of these hours, please call the patient's attending physician.

## 2023-05-01 NOTE — Death Summary Note (Signed)
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 Started not to feel well on Jun 03, 2023, 6/30 with nausea and vomiting. Sustained a fall, onto right knee and left shoulder pain. Admitted to [facility] on 05-01-2023 for further workup with suspected sepsis, febrile 102.3 on admit. Blood cultures positive for MSSA bacteremia, source unclear at this time, being treated additionally for aspiration pneumonia, and mild transaminitis. Nephrology, ID, GI, and palliative care following. Ortho consulted and underwent aspiration of right knee showing many WBC, primarily PMNs. He underwent iHD 7/3 with 1.4L removed 7/3. TTE pending. Overnight, had intermittent low blood pressure episodes and patient is more sonolucent. Given concern for worsening sepsis, PCCM consulted.   05-01-2023, admitted to medical floor for suspected sepsis, fever 102.3.  Blood cultures positive for MSSA bacteremia with unclear source. 7/3, hemodialysis, right knee joint aspiration Overnight hypotension and more somnolent.  Transferred to ICU.  On vasopressors and midodrine. 7/8, I&D of the right knee and bilateral shoulders 7/11, TEE with suspected endocarditis.  Repeat blood cultures  negative.  MRI brain negative. 7/16-7/17, transferred to medical floor after being off vasopressors. Followed by nephrology and ID.  CT chest with contrast, completed on 04/16/2023. 7/18 acute blood loss anemia with 2U PRBCs transfused for Hg 6.5K  Brought back to ICU 7/19 with recurrent sepsis and worsening encephalopathy.  After discussing patient's previously stated wishes all in agreement to allow a natural passing.  Pertinent Labs and Studies  Significant Diagnostic Studies DG Chest Port 1 View  Result Date: 04/18/2023 CLINICAL DATA:  Shortness of breath EXAM: PORTABLE CHEST 1 VIEW COMPARISON:  Chest radiograph 04/07/2023 FINDINGS: The left IJ vascular catheter is stable in position terminating near the confluent with the SVC. The heart is enlarged, unchanged. The upper mediastinal contours are stable. The bilateral pleural effusion seen on the same-day chest CT are not well seen on the current study. There is no new or worsening focal airspace disease. There is no definite overt edema. There is no pneumothorax There is no acute osseous abnormality. IMPRESSION: The bilateral pleural effusion seen on the same-day chest CT are not well seen on the current study. No new or worsening focal airspace disease. Electronically Signed   By: Lesia Hausen M.D.   On: 04/18/2023 15:15   CT CHEST ABDOMEN PELVIS WO CONTRAST  Result Date: 04/18/2023 CLINICAL DATA:  Fever, abdominal pain, delirium EXAM: CT CHEST, ABDOMEN AND PELVIS WITHOUT CONTRAST TECHNIQUE: Multidetector CT imaging of the chest, abdomen and pelvis was performed following the standard protocol without IV contrast. RADIATION DOSE REDUCTION: This exam was performed according to the departmental dose-optimization program which includes automated exposure control, adjustment of the mA and/or kV according to patient size and/or use of iterative reconstruction technique. COMPARISON:  CT chest, 04/16/2023, CT  abdomen pelvis, 04/02/2023 FINDINGS:  Examination is again limited by lack of intravenous contrast and patient arm positioning with associated streak artifact. CT CHEST FINDINGS Cardiovascular: Left neck vascular catheter. Aortic atherosclerosis. Aortic valve calcifications. Normal heart size. Three-vessel coronary artery calcifications. No pericardial effusion. Mediastinum/Nodes: No enlarged mediastinal, hilar, or axillary lymph nodes. Thyroid gland, trachea, and esophagus demonstrate no significant findings. Lungs/Pleura: Unchanged moderate, right-greater-than-left pleural effusions and associated atelectasis or consolidation. Musculoskeletal: No significant change in appearance of the left sternoclavicular joint, with mild, asymmetric distention of the joint capsule (series 5, image 13). No bony erosion or sclerosis. Unchanged bilateral glenohumeral joint effusions (series 5, image 8). No acute osseous findings. CT ABDOMEN PELVIS FINDINGS Hepatobiliary: No solid liver abnormality is seen. Excreted contrast or sludge in the gallbladder. No gallstones, gallbladder wall thickening, or biliary dilatation. Pancreas: Unremarkable. No pancreatic ductal dilatation or surrounding inflammatory changes. Spleen: Normal in size without significant abnormality. Adrenals/Urinary Tract: Adrenal glands are unremarkable. Severely atrophic, partially calcified left kidney. No hydronephrosis. No right-sided calculi or hydronephrosis. Bladder is unremarkable. Stomach/Bowel: Stomach is within normal limits. Appendix appears normal. No evidence of bowel wall thickening, distention, or inflammatory changes. Vascular/Lymphatic: Aortic atherosclerosis. No enlarged abdominal or pelvic lymph nodes. Reproductive: No mass or other abnormality. Other: Fat containing bilateral inguinal hernias. Anasarca. Small volume free fluid in the low abdomen and pelvis. Musculoskeletal: No acute osseous findings. Status post left hip total arthroplasty. Disc degenerative disease and bridging  osteophytosis throughout the thoracic and lumbar spine, in keeping with DISH. IMPRESSION: 1. Unchanged moderate, right-greater-than-left pleural effusions and associated atelectasis or consolidation. 2. No significant change in appearance of the left sternoclavicular joint, with mild, asymmetric distention of the joint capsule. No bony erosion or sclerosis. Findings are nonspecific and may be degenerative or inflammatory, though septic arthritis is not excluded in the setting of fever. 3. Small volume free fluid in the low abdomen and pelvis. Anasarca. 4. Severely atrophic, partially calcified left kidney. No hydronephrosis. 5. Coronary artery disease. Aortic Atherosclerosis (ICD10-I70.0). Electronically Signed   By: Jearld Lesch M.D.   On: 04/18/2023 11:47   CT HEAD WO CONTRAST ( )  Result Date: 04/18/2023 CLINICAL DATA:  Delirium. EXAM: CT HEAD WITHOUT CONTRAST TECHNIQUE: Contiguous axial images were obtained from the base of the skull through the vertex without intravenous contrast. RADIATION DOSE REDUCTION: This exam was performed according to the departmental dose-optimization program which includes automated exposure control, adjustment of the mA and/or kV according to patient size and/or use of iterative reconstruction technique. COMPARISON:  Head CT 04-02-2023 and MRI 04/10/2023 FINDINGS: The study is motion degraded, severely so below the skull base and mildly to moderately through the brain. Brain: Within limitations of motion artifact, no acute infarct, intracranial hemorrhage, mass, midline shift, or extra-axial fluid collection is identified. There is mild cerebral atrophy. Vascular: Calcified atherosclerosis at the skull base. No gross hyperdense vessel. Skull: No acute fracture or suspicious osseous lesion identified within limitations of motion artifact. Sinuses/Orbits: Mild mucosal thickening and small volume fluid in the right maxillary sinus. Small mucous retention cyst in the left  maxillary sinus. Clear mastoid air cells. Bilateral cataract extraction. Other: None. IMPRESSION: Motion degraded examination without evidence of an acute intracranial abnormality. Electronically Signed   By: Sebastian Ache M.D.   On: 04/18/2023 11:40   CT CHEST W CONTRAST  Result Date: 04/16/2023 CLINICAL DATA:  Osteomyelitis suspected, chest. Concern for left septic sternoclavicular arthritis. End-stage renal disease. EXAM: CT CHEST WITH CONTRAST TECHNIQUE: Multidetector CT imaging of the  chest was performed during intravenous contrast administration. RADIATION DOSE REDUCTION: This exam was performed according to the departmental dose-optimization program which includes automated exposure control, adjustment of the mA and/or kV according to patient size and/or use of iterative reconstruction technique. CONTRAST:  50mL OMNIPAQUE IOHEXOL 350 MG/ML SOLN COMPARISON:  No prior chest CT. Chest radiographs 04/07/2023, MRI of the right shoulder 04/05/2023, MRI of the left shoulder 04/03/2023 and CT cervical spine 04/28/2023. FINDINGS: Cardiovascular: Left IJ central venous catheter projects to the level of the confluence of the brachiocephalic veins. No acute vascular findings are demonstrated. There is atherosclerosis of the aorta, great vessels and coronary arteries. There are calcifications of the aortic valve. The heart is mildly enlarged. No significant pericardial effusion. Mediastinum/Nodes: There are no enlarged mediastinal, hilar or axillary lymph nodes.There are scattered small normal-sized mediastinal lymph nodes. The thyroid gland, trachea and esophagus demonstrate no significant findings. Lungs/Pleura: Images through the lung bases are degraded by beam hardening artifact. There are small bilateral pleural effusions with dependent airspace opacities in both lungs. The left lower lobe is completely opacified with associated volume loss. No suspicious nodules within the aerated portions of the lungs. Upper  abdomen: No acute or suspicious findings are seen within the visualized upper abdomen. Musculoskeletal/Chest wall: Asymmetric thickening of the left sternoclavicular joint capsule which was incompletely visualized on previous cervical spine CT, but appears mildly progressive. Mild surrounding soft tissue stranding which could reflect inflammation. No focal fluid collection, foreign body or soft tissue emphysema. Specifically, no retrosternal fluid collection. There is no evidence of erosive disease, osteomyelitis, acute fracture or dislocation. Previously demonstrated shoulder joint effusions are incompletely visualized, but persistent. Probable periarticular inflammatory changes on the right, incompletely visualized. Multilevel thoracic spondylosis with endplate osteophytes. Mild generalized chest wall edema with mild bilateral gynecomastia. Unless specific follow-up recommendations are mentioned in the findings or impression sections, no imaging follow-up of any mentioned incidental findings is recommended. IMPRESSION: 1. Asymmetric thickening of the left sternoclavicular joint capsule with mild surrounding soft tissue stranding, suspicious for septic arthritis. No evidence of erosive disease, osteomyelitis or abscess. 2. Previously demonstrated bilateral shoulder joint effusions are incompletely visualized, but persistent. Probable periarticular inflammatory changes on the right, incompletely visualized. 3. Small bilateral pleural effusions with dependent airspace opacities in both lungs, likely atelectasis. The left lower lobe is completely opacified with associated volume loss and aspiration or pneumonia cannot be excluded. Radiographic follow-up recommended. 4. Mild generalized chest wall edema with mild bilateral gynecomastia. 5.  Aortic Atherosclerosis (ICD10-I70.0). Electronically Signed   By: Carey Bullocks M.D.   On: 04/16/2023 14:57   US Abdomen Limited RUQ (LIVER/GB)  Result Date:  04/12/2023 CLINICAL DATA:  Transaminitis. EXAM: ULTRASOUND ABDOMEN LIMITED RIGHT UPPER QUADRANT COMPARISON:  CT of the abdomen pelvis with contrast 04/02/2023 FINDINGS: Gallbladder: Gallbladder wall thickening is present, measuring up to 7 mm. No stone is present. No sonographic Eulah Pont sign is reported. Common bile duct: Diameter: 3 mm, within normal limits. Liver: Homogeneous diffuse increased echogenicity is present throughout the liver. No discrete lesions are present. Portal vein is patent on color Doppler imaging with normal direction of blood flow towards the liver. Other: A right pleural effusion is noted. IMPRESSION: 1. Diffuse increased echogenicity throughout the liver is nonspecific, but can be seen in the setting of hepatic steatosis. 2. Gallbladder wall thickening is nonspecific. This could represent inflammatory changes associated with acute cholecystitis. No stone or sonographic Eulah Pont sign is present to confirm the diagnosis. 3. Right pleural effusion. Electronically Signed  By: Marin Roberts M.D.   On: 04/12/2023 14:13   ECHO TEE  Result Date: 04/10/2023    TRANSESOPHOGEAL ECHO REPORT   Patient Name:   Peter [name] Date of Exam: 04/10/2023 Medical Rec #:  [id number]         Height:       66.0 in Accession #:    [id number]        Weight:       229.9 lb Date of Birth:  [birth year]         BSA:          2.122 m Patient Age:    72 years          BP:           113/50 mmHg Patient Gender: M                 HR:           84 bpm. Exam Location:  Inpatient Procedure: Transesophageal Echo, 3D Echo and Color Doppler Indications:    Endocarditis  History:        Patient has prior history of Echocardiogram examinations, most                 recent 04/03/2023. Mitral Valve Disease; Signs/Symptoms:Murmur.  Sonographer:    Darlys Gales Referring Phys: [id number] LAURA R INGOLD PROCEDURE: After discussion of the risks and benefits of a TEE, an informed consent was obtained from the patient. The transesophogeal  probe was passed without difficulty through the esophogus of the patient. Imaged were obtained with the patient in a supine position. Sedation performed by different physician. The patient was monitored while under deep sedation. Anesthestetic sedation was provided intravenously by Anesthesiology: 40mg  of Propofol. Image quality was good. The patient's vital signs; including heart rate, blood pressure, and oxygen saturation; remained stable throughout the procedure. The patient developed no complications during the procedure.  IMPRESSIONS  1. Left ventricular ejection fraction, by estimation, is 60 to 65%. The left ventricle has normal function. The left ventricle has no regional wall motion abnormalities.  2. Right ventricular systolic function is normal. The right ventricular size is normal.  3. Abnormal Thickened MV leaflets with moderate mitral annular calcification. There appears to be a flail P2 scallop and very eccentric MR directed anteriorly. The MR is at least moderate to severe but no flow into the left upper PV. The other pulmonary  veins could not be identified. The MR jet wraps around the left atrium.. The mitral valve is abnormal. Moderate to severe mitral valve regurgitation. No evidence of mitral stenosis. Moderate mitral annular calcification.  4. The aortic valve is tricuspid. There is mild calcification of the aortic valve. There is mild thickening of the aortic valve. There is moderate aortic valve annular calcification.     Aortic valve regurgitation is not visualized. Mild aortic valve stenosis.  5. The inferior vena cava is normal in size with greater than 50% respiratory variability, suggesting right atrial pressure of 3 mmHg.  6. No left atrial/left atrial appendage thrombus was detected.  7. Aortic dilatation noted. There is mild dilatation of the aortic root, measuring 38 mm. There is mild (Grade II) layered plaque. Conclusion(s)/Recommendation(s): Normal biventricular function with  moderate to severe mitral regurgitation due to flail P2 segment of posterior MV leaflet. The leaflet is thickened and cannot discern whether this represents myxomatous degeneration or vegetation. FINDINGS  Left Ventricle: Left ventricular ejection fraction, by estimation, is 60 to 65%.  The left ventricle has normal function. The left ventricle has no regional wall motion abnormalities. The left ventricular internal cavity size was normal in size. There is  no left ventricular hypertrophy. Right Ventricle: The right ventricular size is normal. No increase in right ventricular wall thickness. Right ventricular systolic function is normal. Left Atrium: Left atrial size was normal in size. No left atrial/left atrial appendage thrombus was detected. Right Atrium: Right atrial size was normal in size. Pericardium: There is no evidence of pericardial effusion. Mitral Valve: Abnormal Thickened MV leaflets with moderate mitral annular calcification. There appears to be a flail P2 scallop and very eccentric MR directed anteriorly. The MR is at least moderate to severe but no flow into the left upper PV. The other  pulmonary veins could not be identified. The MR jet wraps around the left atrium. The mitral valve is abnormal. Moderate mitral annular calcification. Moderate to severe mitral valve regurgitation, with eccentric anteriorly directed jet. No evidence of mitral valve stenosis. Tricuspid Valve: The tricuspid valve is normal in structure. Tricuspid valve regurgitation is not demonstrated. No evidence of tricuspid stenosis. Aortic Valve: AVA 1.64cm2 by planimetry. The aortic valve is tricuspid. There is mild calcification of the aortic valve. There is mild thickening of the aortic valve. There is moderate aortic valve annular calcification. Aortic valve regurgitation is not  visualized. Mild aortic stenosis is present. Pulmonic Valve: The pulmonic valve was normal in structure. Pulmonic valve regurgitation is not  visualized. No evidence of pulmonic stenosis. Aorta: Aortic dilatation noted. There is mild dilatation of the aortic root, measuring 38 mm. There is mild (Grade II) layered plaque. Venous: The inferior vena cava is normal in size with greater than 50% respiratory variability, suggesting right atrial pressure of 3 mmHg. IAS/Shunts: No atrial level shunt detected by color flow Doppler. Additional Comments: Spectral Doppler performed.  AORTA Ao Root diam: 3.85 cm [name] MD Electronically signed by [name] MD Signature Date/Time: 04/10/2023/1:44:17 PM    Final    EP STUDY  Result Date: 04/10/2023 See surgical note for result.  MR BRAIN WO CONTRAST  Result Date: 04/10/2023 CLINICAL DATA:  72 year old male with staph aureus bacteremia. Confusion and altered mental status. Query CNS infection. EXAM: MRI HEAD WITHOUT CONTRAST TECHNIQUE: Multiplanar, multiecho pulse sequences of the brain and surrounding structures were obtained without intravenous contrast. COMPARISON:  Head CT 04/05/2023. Cervical spine MRI 04/03/2023. FINDINGS: Study is intermittently degraded by motion artifact despite repeated imaging attempts. Brain: No restricted diffusion to suggest acute infarction. No midline shift, mass effect, evidence of mass lesion, ventriculomegaly, extra-axial collection or acute intracranial hemorrhage. Cervicomedullary junction and pituitary are within normal limits. No evidence of intraventricular debris. No evidence of chronic cerebral blood products or microhemorrhage on SWI. Other axial imaging demonstrates normal for age gray and white matter signal throughout the brain. No cerebral edema. No cortical encephalomalacia. Deep gray nuclei, brainstem and cerebellum appear negative. Vascular: Major intracranial vascular flow voids are preserved. Skull and upper cervical spine: Cervical spine detail today is obscured by motion. Visualized bone marrow signal is within normal limits. Sinuses/Orbits:  Postoperative changes to both globes. Scattered mild or at most moderate paranasal sinus mucosal thickening. No sinus fluid levels. Other: Trace right mastoid fluid. Grossly normal visible internal auditory structures. IMPRESSION: Negative for age noncontrast MRI appearance of the Brain when allowing for motion artifact. Electronically Signed   By: Odessa Fleming M.D.   On: 04/10/2023 05:15   DG CHEST PORT 1 VIEW  Result Date:  04/07/2023 CLINICAL DATA:  Central line placement. EXAM: PORTABLE CHEST 1 VIEW COMPARISON:  Chest radiograph dated 04/02/2023. FINDINGS: Left IJ central venous line with tip at the junction of the innominate vein and SVC. Shallow inspiration with bibasilar atelectasis. No focal consolidation, pleural effusion or pneumothorax. Stable cardiomegaly. Atherosclerotic calcification of the aorta. No acute osseous pathology. Degenerative changes of the spine and shoulders. IMPRESSION: Left IJ central venous line with tip at the junction of the innominate vein and SVC. No pneumothorax. Electronically Signed   By: Elgie Collard M.D.   On: 04/07/2023 15:29   DG FL ASP/INJ MAJOR (SHOULDER, HIP, KNEE)  Result Date: 04/05/2023 INDICATION: Right shoulder pain.  Concern for septic joint. EXAM: ASPIRATION OF LARGE JOINT COMPARISON:  MR shoulder right 04/04/2023 CONTRAST:  None FLUOROSCOPY: 1.30 mGy Kerma COMPLICATIONS: None immediate PROCEDURE: Informed written consent was obtained from the patient after discussion of the risks, benefits and alternatives to treatment. The patient was placed supine on the fluoroscopy table. The right was localized with fluoroscopy. The skin overlying the anterior aspect of the right shoulder was prepped and draped in usual sterile fashion. Approximately 3 mL of 1% lidocaine was used to anesthetize the skin. A 22 gauge spinal needle was advanced into the right shoulder joint at the medial superior aspect of the humeral head and an image was saved for documentation purposes.  Approximally 3-4 mL of yellow-green fluid was aspirated. The syringe was capped and sent to the laboratory for analysis as ordered by the clinical team. The needle was removed and a dressing was placed. The patient tolerated procedure well without immediate postprocedural complication. IMPRESSION: Successful fluoroscopic guided aspiration of the right shoulder. Procedure performed by: Mina Marble, PA-C Electronically Signed   By: Genevive Bi M.D.   On: 04/05/2023 14:45   DG FL ASP/INJ MAJOR (SHOULDER, HIP, KNEE)  Result Date: 04/05/2023 INDICATION: Left shoulder pain.  Concern for septic joint. EXAM: ARTHROCENTESIS/INJECTION OF LARGE JOINT COMPARISON:  MR shoulder left 04/03/2023 CONTRAST:  None FLUOROSCOPY: 1.70 mGy Kerma COMPLICATIONS: None immediate PROCEDURE: Informed written consent was obtained from the patient after discussion of the risks, benefits and alternatives to treatment. The patient was placed supine on the fluoroscopy table. The left was localized with fluoroscopy. The skin overlying the anterior aspect of the left shoulder was prepped and draped in usual sterile fashion. Approximately 3 mL of 1% lidocaine was used to anesthetize the skin. A 22 gauge spinal needle was advanced into the left shoulder joint at the medial superior aspect of the humeral head and an image was saved for documentation purposes. Approximally 1 mL of dark yellow fluid was aspirated. The syringe was capped and sent to the laboratory for analysis as ordered by the clinical team. The needle was removed and a dressing was placed. The patient tolerated procedure well without immediate postprocedural complication. IMPRESSION: Successful fluoroscopic guided aspiration of the left shoulder. Procedure performed by: Mina Marble, PA-C Electronically Signed   By: Genevive Bi M.D.   On: 04/05/2023 14:34   MR SHOULDER RIGHT WO CONTRAST  Result Date: 04/05/2023 CLINICAL DATA:  Septic arthritis suspected, shoulder, xray  done EXAM: MRI OF THE RIGHT SHOULDER WITHOUT CONTRAST TECHNIQUE: Multiplanar, multisequence MR imaging of the shoulder was performed. No intravenous contrast was administered. COMPARISON:  None Available. FINDINGS: Rotator cuff: Complete full-thickness tears of the supraspinatus, infraspinatus, and subscapularis tendons. The supraspinatus and subscapularis tendons are retracted to the level of the glenohumeral joint. Intact teres minor. Muscles: Advanced rotator cuff muscle atrophy  and fatty infiltration. Biceps long head:  Not visualized, torn and retracted. Acromioclavicular Joint: Moderate degenerative changes of the AC joint. Moderate volume subacromial-subdeltoid bursal fluid communicating with the glenohumeral joint. Glenohumeral Joint: Large glenohumeral joint effusion. Diffuse chondral thinning with multifocal areas of full-thickness fissuring along both sides of the joint. Labrum:  Diffusely degenerated. Bones: No acute fracture. No dislocation. No bone marrow edema. No erosion is seen. No suspicious marrow replacing bone lesion. Other: None. IMPRESSION: 1. Large glenohumeral joint effusion, nonspecific. Arthrocentesis with joint fluid analysis is recommended to exclude the possibility of septic arthritis. 2. Complete full-thickness tears of the supraspinatus, infraspinatus, and subscapularis tendons with advanced rotator cuff muscle atrophy and fatty infiltration. 3. Torn and retracted long head of the biceps tendon. 4. Moderate glenohumeral and AC joint osteoarthritis. Electronically Signed   By: Duanne Guess D.O.   On: 04/05/2023 09:11   DG Wrist Complete Right  Result Date: 04/04/2023 CLINICAL DATA:  Recent fall with right wrist pain, initial encounter EXAM: RIGHT WRIST - COMPLETE 3+ VIEW COMPARISON:  CT of the wrist obtained on the same day. 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 No acute fracture or dislocation is noted. Vascular calcifications are noted as  well as mild soft tissue swelling. No acute abnormality seen. IMPRESSION: No acute abnormality noted. Electronically Signed   By: Alcide Clever M.D.   On: 04/04/2023 19:11   CT WRIST RIGHT W CONTRAST  Result Date: 04/04/2023 CLINICAL DATA:  Evaluate for septic joint/effusion. Unable to tolerate MRI. EXAM: CT OF THE UPPER RIGHT EXTREMITY WITH CONTRAST TECHNIQUE: Multidetector CT imaging of the upper right extremity was performed according to the standard protocol following intravenous contrast administration. RADIATION DOSE REDUCTION: This exam was performed according to the departmental dose-optimization program which includes automated exposure control, adjustment of the mA and/or kV according to patient size and/or use of iterative reconstruction technique. CONTRAST:  75mL OMNIPAQUE IOHEXOL 350 MG/ML SOLN COMPARISON:  None Available. FINDINGS: Patient scanned with arms down positioning which limits detailed osseous and soft tissue assessment due to soft tissue attenuation from habitus. Bones/Joint/Cartilage No fracture. Possible remote triquetral fracture. No erosive change. There may be a small distal radioulnar joint effusion, series 5, image 62. Suspected small radiocarpal joint effusion sagittal series 11, image 66. This is difficult to accurately assess on the current exam. Ligaments Suboptimally assessed by CT. Muscles and Tendons No obvious intramuscular collection. Limited assessment for tenosynovial fluid. Soft tissues No soft tissue subcutaneous collection. Mild edema is noted about the ulnar aspect of the wrist and distal forearm. Incidental note of edema in the subcutaneous tissues about the right flank, partially included in this field of view. IMPRESSION: 1. Patient scanned with arms down positioning which limits detailed osseous and soft tissue assessment due to soft tissue attenuation from habitus. 2. There may be a small distal radioulnar joint effusion. Suspected small radiocarpal joint  effusion. This is difficult to accurately assess on the current exam. No gross CT findings of osteomyelitis. 3. Mild edema about the ulnar aspect of the wrist and distal forearm. No soft tissue collection. Electronically Signed   By: Narda Rutherford M.D.   On: 04/04/2023 17:06   ECHOCARDIOGRAM COMPLETE  Result Date: 04/03/2023    ECHOCARDIOGRAM REPORT   Patient Name:   Peter [name] Date of Exam: 04/03/2023 Medical Rec #:  [id number]         Height:       66.0 in Accession #:  [id number]        Weight:       216.0 lb Date of Birth:  [birth year]         BSA:          2.067 m Patient Age:    72 years          BP:           106/33 mmHg Patient Gender: M                 HR:           110 bpm. Exam Location:  Inpatient Procedure: 2D Echo, Cardiac Doppler and Color Doppler Indications:    Bacteremia R78.81  History:        Patient has prior history of Echocardiogram examinations, most                 recent 05/30/2018. CHF, Arrythmias:Atrial Fibrillation and                 Bradycardia, Signs/Symptoms:Shortness of Breath and Syncope;                 Risk Factors:Hypertension, Sleep Apnea and Diabetes. ESRD,                 Arteriovenous fistula.  Sonographer:    Lucendia Herrlich Referring Phys: [id number] CORNELIUS N V[place] IMPRESSIONS  1. Left ventricular ejection fraction, by estimation, is 60 to 65%. The left ventricle has normal function. The left ventricle has no regional wall motion abnormalities. There is severe left ventricular hypertrophy. Left ventricular diastolic parameters  are indeterminate.  2. Right ventricular systolic function is normal. The right ventricular size is normal.  3. Left atrial size was moderately dilated.  4. Mild appearing MR but some splay artifact cannot r/o some posterior leaflet prolapse in addition to MAC with anteriorly directed MR Given bacteremia and abnormal posterior mitral annulus suggest TEE to further assess if clnically indicated. The mitral valve is degenerative. Mild mitral  valve regurgitation. No evidence of mitral stenosis. Moderate mitral annular calcification.  5. The aortic valve is tricuspid. There is moderate calcification of the aortic valve. There is moderate thickening of the aortic valve. Aortic valve regurgitation is not visualized. Mild aortic valve stenosis.  6. The inferior vena cava is dilated in size with >50% respiratory variability, suggesting right atrial pressure of 8 mmHg. FINDINGS  Left Ventricle: Left ventricular ejection fraction, by estimation, is 60 to 65%. The left ventricle has normal function. The left ventricle has no regional wall motion abnormalities. The left ventricular internal cavity size was normal in size. There is  severe left ventricular hypertrophy. Left ventricular diastolic parameters are indeterminate. Right Ventricle: The right ventricular size is normal. No increase in right ventricular wall thickness. Right ventricular systolic function is normal. Left Atrium: Left atrial size was moderately dilated. Right Atrium: Right atrial size was normal in size. Pericardium: There is no evidence of pericardial effusion. Mitral Valve: Mild appearing MR but some splay artifact cannot r/o some posterior leaflet prolapse in addition to MAC with anteriorly directed MR Given bacteremia and abnormal posterior mitral annulus suggest TEE to further assess if clnically indicated.  The mitral valve is degenerative in appearance. There is moderate thickening of the mitral valve leaflet(s). There is moderate calcification of the mitral valve leaflet(s). Moderate mitral annular calcification. Mild mitral valve regurgitation. No evidence of mitral valve stenosis. Tricuspid Valve: The tricuspid valve is normal in structure. Tricuspid valve regurgitation is  mild . No evidence of tricuspid stenosis. Aortic Valve: The aortic valve is tricuspid. There is moderate calcification of the aortic valve. There is moderate thickening of the aortic valve. Aortic valve  regurgitation is not visualized. Mild aortic stenosis is present. Aortic valve mean gradient measures 9.0 mmHg. Aortic valve peak gradient measures 16.6 mmHg. Aortic valve area, by VTI measures 1.54 cm. Pulmonic Valve: The pulmonic valve was normal in structure. Pulmonic valve regurgitation is not visualized. No evidence of pulmonic stenosis. Aorta: The aortic root is normal in size and structure. Venous: The inferior vena cava is dilated in size with greater than 50% respiratory variability, suggesting right atrial pressure of 8 mmHg. IAS/Shunts: No atrial level shunt detected by color flow Doppler.  LEFT VENTRICLE PLAX 2D LVIDd:         4.00 cm   Diastology LVIDs:         2.60 cm   LV e' lateral:   13.40 cm/s LV PW:         1.50 cm   LV E/e' lateral: 8.8 LV IVS:        1.80 cm LVOT diam:     2.30 cm LV SV:         51 LV SV Index:   24 LVOT Area:     4.15 cm  RIGHT VENTRICLE             IVC RV S prime:     15.20 cm/s  IVC diam: 2.10 cm TAPSE (M-mode): 1.9 cm LEFT ATRIUM            Index        RIGHT ATRIUM           Index LA diam:      4.50 cm  2.18 cm/m   RA Area:     19.40 cm LA Vol (A2C): 104.0 ml 50.33 ml/m  RA Volume:   46.80 ml  22.65 ml/m LA Vol (A4C): 128.0 ml 61.94 ml/m  AORTIC VALVE AV Area (Vmax):    1.77 cm AV Area (Vmean):   1.67 cm AV Area (VTI):     1.54 cm AV Vmax:           204.00 cm/s AV Vmean:          137.000 cm/s AV VTI:            0.329 m AV Peak Grad:      16.6 mmHg AV Mean Grad:      9.0 mmHg LVOT Vmax:         87.00 cm/s LVOT Vmean:        55.067 cm/s LVOT VTI:          0.122 m LVOT/AV VTI ratio: 0.37  AORTA Ao Root diam: 3.70 cm Ao Asc diam:  3.90 cm MITRAL VALVE                TRICUSPID VALVE MV Area (PHT): 4.15 cm     TR Peak grad:   30.7 mmHg MV Decel Time: 183 msec     TR Vmax:        277.00 cm/s MR Peak grad: 31.4 mmHg MR Vmax:      280.00 cm/s   SHUNTS MV E velocity: 118.00 cm/s  Systemic VTI:  0.12 m MV A velocity: 45.30 cm/s   Systemic Diam: 2.30 cm MV E/A ratio:  2.60  Charlton Haws MD Electronically signed by Charlton Haws MD Signature Date/Time: 04/03/2023/5:07:21 PM    Final  MR SHOULDER LEFT WO CONTRAST  Result Date: 04/03/2023 CLINICAL DATA:  Left shoulder pain. EXAM: MRI OF THE LEFT SHOULDER WITHOUT CONTRAST TECHNIQUE: Multiplanar, multisequence MR imaging of the shoulder was performed. No intravenous contrast was administered. COMPARISON:  None Available. FINDINGS: Rotator cuff: Complete tear of the supraspinatus and infraspinatus tendons with 5.7 cm of retraction. Teres minor tendon is intact. Moderate tendinosis of the subscapularis tendon with a small partial-thickness tear. Muscles: Severe atrophy of the supraspinatus, infraspinatus and subscapularis muscles. Biceps Long Head: Complete tear of the proximal long head of the biceps tendon. Acromioclavicular Joint: Moderate arthropathy of the acromioclavicular joint. Small amount of subacromial/subdeltoid bursal fluid. Glenohumeral Joint: Large joint effusion. Partial-thickness cartilage loss of the glenohumeral joint with areas of high-grade partial-thickness cartilage loss. Labrum: Generalized labral degeneration. Bones: No fracture or dislocation. No aggressive osseous lesion. Other: No fluid collection or hematoma. Muscle edema in the short of the biceps muscle and coracobrachialis muscle concerning for muscle strain. IMPRESSION: 1. Complete tear of the supraspinatus and infraspinatus tendons with 5.7 cm of retraction. 2. Moderate tendinosis of the subscapularis tendon with a small partial-thickness tear. 3. Complete tear of the proximal long head of the biceps tendon. 4. Moderate osteoarthritis of the glenohumeral joint. Large glenohumeral joint effusion. Electronically Signed   By: Elige Ko M.D.   On: 04/03/2023 10:45   MR CERVICAL SPINE WO CONTRAST  Result Date: 04/03/2023 CLINICAL DATA:  In stage renal disease, cervical bone lesion. EXAM: MRI CERVICAL SPINE WITHOUT CONTRAST TECHNIQUE: Multiplanar,  multisequence MR imaging of the cervical spine was performed. No intravenous contrast was administered. COMPARISON:  CT cervical spine Apr 14, 2023 FINDINGS: Alignment: Physiologic. Vertebrae: No acute fracture, evidence of discitis, or aggressive bone lesion. Cord: Normal signal and morphology. Posterior Fossa, vertebral arteries, paraspinal tissues: Posterior fossa demonstrates no focal abnormality. Vertebral artery flow voids are maintained. Paraspinal soft tissues are unremarkable. Disc levels: Discs: Degenerative disease with disc height loss at C4-5 and C5-6. Degenerative disease with disc height loss at C7-T1, T1-2 and T2-3. C2-3: Small central disc protrusion. Moderate right and mild left facet arthropathy. Mild bilateral foraminal stenosis. No spinal stenosis. C3-4: Broad-based disc bulge. Broad-based disc bulge with a small central disc protrusion. Moderate bilateral facet arthropathy. No severe bilateral foraminal stenosis. Severe spinal stenosis. C4-5: Broad-based disc bulge with a broad central disc protrusion. Severe spinal stenosis. Severe bilateral foraminal stenosis. C5-6: Mild broad-based disc bulge. Moderate right facet arthropathy. Severe bilateral foraminal stenosis. Mild spinal stenosis. C6-7: Mild broad-based disc bulge. Mild bilateral facet arthropathy. Severe right foraminal stenosis. Moderate left foraminal stenosis. No spinal stenosis. C7-T1: Minimal broad-based disc bulge. No foraminal or central canal stenosis. T1-2: Broad-based disc bulge. Moderate bilateral foraminal stenosis. No spinal stenosis. Bilateral facet arthropathy. T2-3: Broad-based disc bulge. Bilateral facet arthropathy. Mild bilateral foraminal stenosis. IMPRESSION: 1. Diffuse cervical spine spondylosis as described above. 2. No acute osseous injury of the cervical spine. 3. No aggressive osseous lesion of the cervical spine. 4. At C3-4 there is a broad-based disc bulge with a small central disc protrusion. Moderate  bilateral facet arthropathy. No severe bilateral foraminal stenosis. Severe spinal stenosis. 5. At C4-5 there is a broad-based disc bulge with a broad central disc protrusion. Severe spinal stenosis. Severe bilateral foraminal stenosis. 6. At C5-6 there is a mild broad-based disc bulge. Moderate right facet arthropathy. Severe bilateral foraminal stenosis. Mild spinal stenosis. 7. At C6-7 there is a mild broad-based disc bulge. Mild bilateral facet arthropathy. Severe right foraminal stenosis. Moderate left foraminal stenosis. Electronically  Signed   By: Elige Ko M.D.   On: 04/03/2023 10:40   VAS [place] LOWER EXTREMITY VENOUS (DVT)  Result Date: 04/02/2023  Lower Venous DVT Study Patient Name:  Peter [name]  Date of Exam:   04/02/2023 Medical Rec #: [id number]          Accession #:    [id number] Date of Birth: 14-Jul-1951          Patient Gender: M Patient Age:   33 years Exam Location:  [facility] Procedure:      VAS [place] LOWER EXTREMITY VENOUS (DVT) Referring Phys: Kathlen Mody --------------------------------------------------------------------------------  Indications: Swelling.  Limitations: Body habitus and poor ultrasound/tissue interface. Comparison Study: Previous 01/09/18 negative. Performing Technologist: McKayla Maag RVT, [place]  Examination Guidelines: A complete evaluation includes B-mode imaging, spectral Doppler, color Doppler, and power Doppler as needed of all accessible portions of each vessel. Bilateral testing is considered an integral part of a complete examination. Limited examinations for reoccurring indications may be performed as noted. The reflux portion of the exam is performed with the patient in reverse Trendelenburg.  +-----+---------------+---------+-----------+----------+--------------+ RIGHTCompressibilityPhasicitySpontaneityPropertiesThrombus Aging +-----+---------------+---------+-----------+----------+--------------+ CFV  Full           Yes      Yes                                  +-----+---------------+---------+-----------+----------+--------------+ SFJ  Full                                                        +-----+---------------+---------+-----------+----------+--------------+   +---------+---------------+---------+-----------+----------+-------------------+ LEFT     CompressibilityPhasicitySpontaneityPropertiesThrombus Aging      +---------+---------------+---------+-----------+----------+-------------------+ CFV      Full           Yes      Yes                                      +---------+---------------+---------+-----------+----------+-------------------+ SFJ      Full                                                             +---------+---------------+---------+-----------+----------+-------------------+ FV Prox  Full                                                             +---------+---------------+---------+-----------+----------+-------------------+ FV Mid   Full                                                             +---------+---------------+---------+-----------+----------+-------------------+ FV DistalFull                                                             +---------+---------------+---------+-----------+----------+-------------------+  PFV      Full                                                             +---------+---------------+---------+-----------+----------+-------------------+ POP      Full           Yes      Yes                                      +---------+---------------+---------+-----------+----------+-------------------+ PTV      Full                                                             +---------+---------------+---------+-----------+----------+-------------------+ PERO                                                  Not well visualized +---------+---------------+---------+-----------+----------+-------------------+     Summary: RIGHT: - No evidence of common femoral vein obstruction.  LEFT: - There is no evidence of deep vein thrombosis in the lower extremity. However, portions of this examination were limited- see technologist comments above.  - No cystic structure found in the popliteal fossa.  *See table(s) above for measurements and observations. Electronically signed by Coral Else MD on 04/02/2023 at 9:15:20 PM.    Final    CT ABDOMEN PELVIS W CONTRAST  Result Date: 04/02/2023 CLINICAL DATA:  Abdominal pain, acute, nonlocalized. End stage renal disease. Nausea and vomiting. Sepsis. EXAM: CT ABDOMEN AND PELVIS WITH CONTRAST TECHNIQUE: Multidetector CT imaging of the abdomen and pelvis was performed using the standard protocol following bolus administration of intravenous contrast. RADIATION DOSE REDUCTION: This exam was performed according to the departmental dose-optimization program which includes automated exposure control, adjustment of the mA and/or kV according to patient size and/or use of iterative reconstruction technique. CONTRAST:  75mL OMNIPAQUE IOHEXOL 350 MG/ML SOLN COMPARISON:  04/05/2009 FINDINGS: Lower chest: Mild dependent atelectasis at the lung bases. Tiny amount of pleural fluid. Non dependent lung as well aerated. There is cardiomegaly and coronary artery calcification. Hepatobiliary: No liver parenchymal abnormality. No calcified gallstones. No ductal dilatation. Pancreas: Normal Spleen: Normal Adrenals/Urinary Tract: Adrenal glands are normal. Atrophic kidneys, more so on the left than the right. No hydroureteronephrosis. No stone or mass. Bladder is not well seen because of artifact from a left hip replacement. No bladder distension. Stomach/Bowel: Stomach and small intestine are normal. Normal appendix. No abnormal colon finding. Diverticulosis without evidence of diverticulitis. Vascular/Lymphatic: Aorta and IVC are normal.  No adenopathy. Reproductive: Normal Other: No free fluid or air.  Musculoskeletal: Ordinary mild degenerative change affects the spine. IMPRESSION: 1. No acute finding by CT. 2. Atrophic kidneys, more so on the left than the right. 3. Diverticulosis without evidence of diverticulitis. 4. Cardiomegaly and coronary artery calcification. 5. Mild dependent atelectasis at the lung bases. Tiny amount of pleural fluid. Electronically Signed   By: Loraine Leriche  Shogry M.D.   On: 04/02/2023 13:01   DG Abd Portable 1V  Result Date: 04/02/2023 CLINICAL DATA:  Nausea and vomiting EXAM: PORTABLE ABDOMEN - 1 VIEW COMPARISON:  Abdominal CT 04/05/2009 FINDINGS: Normal bowel gas pattern. Stool at the rectum without generalized abnormal stool retention. No concerning mass effect or calcification. Cardiomegaly with clear lung bases. Generalized spondylosis. Unremarkable left hip replacement. IMPRESSION: Normal bowel gas pattern. Electronically Signed   By: Tiburcio Pea M.D.   On: 04/02/2023 06:59   DG Chest Port 1 View  Result Date: 04/02/2023 CLINICAL DATA:  Shortness of breath EXAM: PORTABLE CHEST 1 VIEW COMPARISON:  04/30/2023 FINDINGS: Chronic cardiomegaly. Stable mediastinal contours. Low volume but clear lungs with no effusion or pneumothorax. Artifact from EKG leads. IMPRESSION: Stable low volume chest with cardiomegaly. Electronically Signed   By: Tiburcio Pea M.D.   On: 04/02/2023 06:58   DG Shoulder Left  Result Date: 04/18/2023 CLINICAL DATA:  Pain after fall EXAM: LEFT SHOULDER - 2 VIEW COMPARISON:  None Available. FINDINGS: Osteopenia. Osteophyte formation of the glenohumeral joint and AC joint with some joint space loss. There is also elevation of the humeral head relative to the acromion. Please correlate for a full-thickness rotator cuff tear. No acute fracture or dislocation. Surgical clips in the left axillary region. IMPRESSION: Moderate degenerative changes. High-riding humeral head. Please correlate for a full-thickness rotator cuff tear Electronically Signed   By: Karen Kays M.D.   On: 04/18/2023 11:04   DG Knee Complete 4 Views Right  Result Date: 04/24/2023 CLINICAL DATA:  Pain after fall EXAM: RIGHT KNEE - COMPLETE 4 VIEW COMPARISON:  None Available. FINDINGS: Moderate joint space loss of the medial compartment. Small osteophytes seen of all 3 compartments greatest of the patellofemoral joint. There is a moderate joint effusion. Hyperostosis. Osteopenia. No fracture or dislocation. IMPRESSION: Tricompartment degenerative changes identified, overall moderate greatest involving the medial compartment and patellofemoral joint. Moderate joint effusion of uncertain etiology. This has a broad differential. This could be related to the degenerative changes. However please correlate for history of trauma or infection. Electronically Signed   By: Karen Kays M.D.   On: 04/27/2023 11:03   DG Knee Complete 4 Views Left  Result Date: 03/31/2023 CLINICAL DATA:  Pain after fall.  Weakness for 2 days EXAM: LEFT KNEE - COMPLETE 4 VIEW COMPARISON:  None Available. FINDINGS: Osteopenia. Slight joint space loss of the medial compartment. Small osteophytes of all 3 compartments. No joint effusion on lateral view. No fracture or dislocation. Hyperostosis of the patella. Vascular calcifications. IMPRESSION: Mild degenerative changes.  No joint effusion. Electronically Signed   By: Karen Kays M.D.   On: 04/08/2023 11:00   CT Head Wo Contrast  Result Date: 04/18/2023 CLINICAL DATA:  Provided history: Polytrauma, blunt. EXAM: CT HEAD WITHOUT CONTRAST CT CERVICAL SPINE WITHOUT CONTRAST TECHNIQUE: Multidetector CT imaging of the head and cervical spine was performed following the standard protocol without intravenous contrast. Multiplanar CT image reconstructions of the cervical spine were also generated. RADIATION DOSE REDUCTION: This exam was performed according to the departmental dose-optimization program which includes automated exposure control, adjustment of the mA and/or kV according  to patient size and/or use of iterative reconstruction technique. COMPARISON:  None. FINDINGS: CT HEAD FINDINGS Brain: Mild generalized parenchymal atrophy. There is no acute intracranial hemorrhage. No demarcated cortical infarct. No extra-axial fluid collection. No evidence of an intracranial mass. No midline shift. Vascular: No hyperdense vessel.  Atherosclerotic calcifications. Skull: No calvarial fracture or aggressive  osseous lesion. Sinuses/Orbits: No mass or acute finding within the imaged orbits. Mild-to-moderate mucosal thickening, and small volume frothy secretions, within the right maxillary sinus. Small mucous retention cyst within the left maxillary sinus. CT CERVICAL SPINE FINDINGS Alignment: No significant spondylolisthesis. Skull base and vertebrae: The basion-dental and atlanto-dental intervals are maintained.No evidence of acute fracture to the cervical spine. Soft tissues and spinal canal: No prevertebral fluid or swelling. No visible canal hematoma. Disc levels: Cervical spondylosis with multilevel disc space narrowing, disc bulges/central disc protrusions, endplate spurring, uncovertebral hypertrophy and facet arthrosis. Disc space narrowing is greatest at C5-C6 (moderate/advanced), C7-T1 (moderate/advanced), T1-T2 (advanced) and T2-T3 (advanced). 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 Most notably at C2-C3 and C3-C4, central disc protrusions contribute to at least moderate spinal canal stenosis. Multilevel bony neural foraminal narrowing. Upper chest: No consolidation within the imaged lung apices. No visible pneumothorax. IMPRESSION: CT head: 1.  No evidence of an acute intracranial abnormality. 2. Mild generalized parenchymal atrophy. 3. Paranasal sinus disease as described. CT cervical spine: 1. No evidence of an acute cervical spine fracture. 2. Cervical spondylosis as outlined. Notably at C2-C3 and C3-C4, central disc protrusions  contribute to at least moderate spinal canal stenosis. Multilevel bony neural from narrowing. Electronically Signed   By: Jackey Loge D.O.   On: April 10, 2023 10:32   CT Cervical Spine Wo Contrast  Result Date: April 10, 2023 CLINICAL DATA:  Provided history: Polytrauma, blunt. EXAM: CT HEAD WITHOUT CONTRAST CT CERVICAL SPINE WITHOUT CONTRAST TECHNIQUE: Multidetector CT imaging of the head and cervical spine was performed following the standard protocol without intravenous contrast. Multiplanar CT image reconstructions of the cervical spine were also generated. RADIATION DOSE REDUCTION: This exam was performed according to the departmental dose-optimization program which includes automated exposure control, adjustment of the mA and/or kV according to patient size and/or use of iterative reconstruction technique. COMPARISON:  None. FINDINGS: CT HEAD FINDINGS Brain: Mild generalized parenchymal atrophy. There is no acute intracranial hemorrhage. No demarcated cortical infarct. No extra-axial fluid collection. No evidence of an intracranial mass. No midline shift. Vascular: No hyperdense vessel.  Atherosclerotic calcifications. Skull: No calvarial fracture or aggressive osseous lesion. Sinuses/Orbits: No mass or acute finding within the imaged orbits. Mild-to-moderate mucosal thickening, and small volume frothy secretions, within the right maxillary sinus. Small mucous retention cyst within the left maxillary sinus. CT CERVICAL SPINE FINDINGS Alignment: No significant spondylolisthesis. Skull base and vertebrae: The basion-dental and atlanto-dental intervals are maintained.No evidence of acute fracture to the cervical spine. Soft tissues and spinal canal: No prevertebral fluid or swelling. No visible canal hematoma. Disc levels: Cervical spondylosis with multilevel disc space narrowing, disc bulges/central disc protrusions, endplate spurring, uncovertebral hypertrophy and facet arthrosis. Disc space narrowing is greatest  at C5-C6 (moderate/advanced), C7-T1 (moderate/advanced), T1-T2 (advanced) and T2-T3 (advanced). 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 Most notably at C2-C3 and C3-C4, central disc protrusions contribute to at least moderate spinal canal stenosis. Multilevel bony neural foraminal narrowing. Upper chest: No consolidation within the imaged lung apices. No visible pneumothorax. IMPRESSION: CT head: 1.  No evidence of an acute intracranial abnormality. 2. Mild generalized parenchymal atrophy. 3. Paranasal sinus disease as described. CT cervical spine: 1. No evidence of an acute cervical spine fracture. 2. Cervical spondylosis as outlined. Notably at C2-C3 and C3-C4, central disc protrusions contribute to at least moderate spinal canal stenosis. Multilevel bony neural from narrowing. Electronically Signed   By: Jackey Loge  D.O.   On: 04/27/23 10:32   DG Chest Portable 1 View  Result Date: April 27, 2023 CLINICAL DATA:  Shortness of breath EXAM: PORTABLE CHEST 1 VIEW COMPARISON:  Chest radiograph dated 06/09/2018 FINDINGS: The medial apices are obscured due to overlying mandible. Low lung volumes with bronchovascular crowding. Medial right lower lung patchy opacities. No pleural effusion or pneumothorax. Similar enlarged cardiomediastinal silhouette. No acute osseous abnormality. IMPRESSION: 1. Medial right lower lung patchy opacities, which may represent atelectasis, aspiration, or pneumonia. 2. Similar cardiomegaly. Electronically Signed   By: Agustin Cree M.D.   On: April 27, 2023 08:50    Microbiology Recent Results (from the past 240 hour(s))  Culture, blood (Routine X 2) w Reflex to ID Panel     Status: None   Collection Time: 04/17/23 11:05 AM   Specimen: BLOOD RIGHT HAND  Result Value Ref Range Status   Specimen Description BLOOD RIGHT HAND  Final   Special Requests   Final    BOTTLES DRAWN AEROBIC AND ANAEROBIC Blood Culture adequate volume    Culture   Final    NO GROWTH 5 DAYS Performed at [facility], [address], [place], [place] [address]    Report Status 04/22/2023 FINAL  Final  Culture, blood (Routine X 2) w Reflex to ID Panel     Status: None   Collection Time: 04/17/23 11:05 AM   Specimen: BLOOD RIGHT HAND  Result Value Ref Range Status   Specimen Description BLOOD RIGHT HAND  Final   Special Requests   Final    BOTTLES DRAWN AEROBIC AND ANAEROBIC Blood Culture adequate volume   Culture   Final    NO GROWTH 5 DAYS Performed at [facility], [address] [place], [place] [address]    Report Status 04/22/2023 FINAL  Final    Lab Basic Metabolic Panel: Recent Labs  Lab 04/18/23 0648  NA 129*  K 4.9  CL 90*  CO2 25  GLUCOSE 99  BUN 50*  CREATININE 5.33*  CALCIUM 10.1  MG 2.0  PHOS 5.3*   Liver Function Tests: Recent Labs  Lab 04/18/23 0648  AST 52*  ALT 8  ALKPHOS 69  BILITOT 2.4*  PROT 7.5  ALBUMIN 1.8*   No results for input(s): "LIPASE", "AMYLASE" in the last 168 hours. No results for input(s): "AMMONIA" in the last 168 hours. CBC: Recent Labs  Lab 04/17/23 2110 04/18/23 0648  WBC  --  7.7  HGB 8.8* 8.4*  HCT 25.8* 25.7*  MCV  --  96.6  PLT  --  174   Cardiac Enzymes: No results for input(s): "CKTOTAL", "CKMB", "CKMBINDEX", "TROPONINI" in the last 168 hours. Sepsis Labs: Recent Labs  Lab 04/18/23 0648  WBC 7.7    Lorin Glass 04/24/2023, 7:03 PM

## 2023-05-01 NOTE — Progress Notes (Signed)
Pt expired 04/23/2023 at 0640. Vaughan Sine, RN and Winn Jock, RN auscultated for breath sounds and heart beat. There were none. Dr. Levon Hedger was notified. He responded by saying thanks.

## 2023-05-01 NOTE — Significant Event (Signed)
Pt expired at 0640.  Prior RN Flossie Buffy and Steward Drone, RN pronounce pt per MD's orders. Call and notified [facility] at [phone] to make them aware of pt's death.  Prep eyes as instructed per [facility].  Performed post mortem care and prep body for the morgue.  Call [facility] at 0945 in [place], [place] to inform them of pt's death.  Pt was transported via gurney accompanied by Viri, NT to the morgue.

## 2023-05-01 DEATH — deceased

## 2023-09-11 ENCOUNTER — Ambulatory Visit: Payer: Medicare Other | Admitting: Cardiology
